# Patient Record
Sex: Female | Born: 1965 | Race: Black or African American | Hispanic: No | Marital: Married | Smoking: Never smoker
Health system: Southern US, Community
[De-identification: ages and names within clinical notes are randomized; demographics above are authoritative.]

## PROBLEM LIST (undated history)

## (undated) ENCOUNTER — Emergency Department (HOSPITAL_COMMUNITY): Admission: EM | Payer: Medicare Other

## (undated) DIAGNOSIS — G4733 Obstructive sleep apnea (adult) (pediatric): Secondary | ICD-10-CM

## (undated) DIAGNOSIS — I639 Cerebral infarction, unspecified: Secondary | ICD-10-CM

## (undated) DIAGNOSIS — N201 Calculus of ureter: Secondary | ICD-10-CM

## (undated) DIAGNOSIS — J45909 Unspecified asthma, uncomplicated: Secondary | ICD-10-CM

## (undated) DIAGNOSIS — F32A Depression, unspecified: Secondary | ICD-10-CM

## (undated) DIAGNOSIS — F209 Schizophrenia, unspecified: Secondary | ICD-10-CM

## (undated) DIAGNOSIS — K859 Acute pancreatitis without necrosis or infection, unspecified: Secondary | ICD-10-CM

## (undated) DIAGNOSIS — F329 Major depressive disorder, single episode, unspecified: Secondary | ICD-10-CM

## (undated) DIAGNOSIS — Z8709 Personal history of other diseases of the respiratory system: Secondary | ICD-10-CM

## (undated) DIAGNOSIS — F419 Anxiety disorder, unspecified: Secondary | ICD-10-CM

## (undated) DIAGNOSIS — R350 Frequency of micturition: Secondary | ICD-10-CM

## (undated) DIAGNOSIS — Z973 Presence of spectacles and contact lenses: Secondary | ICD-10-CM

## (undated) DIAGNOSIS — I10 Essential (primary) hypertension: Secondary | ICD-10-CM

## (undated) DIAGNOSIS — K219 Gastro-esophageal reflux disease without esophagitis: Secondary | ICD-10-CM

## (undated) DIAGNOSIS — F319 Bipolar disorder, unspecified: Secondary | ICD-10-CM

## (undated) DIAGNOSIS — K863 Pseudocyst of pancreas: Secondary | ICD-10-CM

## (undated) DIAGNOSIS — E119 Type 2 diabetes mellitus without complications: Secondary | ICD-10-CM

## (undated) HISTORY — PX: TONSILLECTOMY: SUR1361

---

## 2011-04-10 ENCOUNTER — Emergency Department (HOSPITAL_COMMUNITY)
Admission: EM | Admit: 2011-04-10 | Discharge: 2011-04-10 | Disposition: A | Discharge status: Home / Self Care | Payer: Medicaid Other | Attending: Emergency Medicine | Admitting: Emergency Medicine

## 2011-04-10 ENCOUNTER — Encounter: Payer: Self-pay | Admitting: *Deleted

## 2011-04-10 DIAGNOSIS — N390 Urinary tract infection, site not specified: Secondary | ICD-10-CM

## 2011-04-10 DIAGNOSIS — R300 Dysuria: Secondary | ICD-10-CM | POA: Insufficient documentation

## 2011-04-10 DIAGNOSIS — A499 Bacterial infection, unspecified: Secondary | ICD-10-CM | POA: Insufficient documentation

## 2011-04-10 DIAGNOSIS — R109 Unspecified abdominal pain: Secondary | ICD-10-CM | POA: Insufficient documentation

## 2011-04-10 DIAGNOSIS — N760 Acute vaginitis: Secondary | ICD-10-CM | POA: Insufficient documentation

## 2011-04-10 DIAGNOSIS — B9689 Other specified bacterial agents as the cause of diseases classified elsewhere: Secondary | ICD-10-CM | POA: Insufficient documentation

## 2011-04-10 LAB — URINE MICROSCOPIC-ADD ON

## 2011-04-10 LAB — URINALYSIS, ROUTINE W REFLEX MICROSCOPIC
Glucose, UA: NEGATIVE mg/dL
Hgb urine dipstick: NEGATIVE
Ketones, ur: NEGATIVE mg/dL
Protein, ur: NEGATIVE mg/dL
pH: 6 (ref 5.0–8.0)

## 2011-04-10 LAB — WET PREP, GENITAL: Yeast Wet Prep HPF POC: NONE SEEN

## 2011-04-10 MED ORDER — SULFAMETHOXAZOLE-TRIMETHOPRIM 800-160 MG PO TABS: 1.0000 | ORAL_TABLET | Freq: Two times a day (BID) | ORAL | Status: AC

## 2011-04-10 MED ORDER — METRONIDAZOLE 500 MG PO TABS: 500.0000 mg | ORAL_TABLET | Freq: Two times a day (BID) | ORAL | Status: AC

## 2011-04-10 NOTE — ED Notes (Signed)
Patient with burning sensation when she urinates.  She also states that the smell of her urine has an odor to it

## 2011-04-10 NOTE — ED Provider Notes (Signed)
History     CSN: [id number] Arrival date & time: 04/10/2011  7:49 PM   First MD Initiated Contact with Patient 04/10/11 2113      Chief Complaint  Patient presents with  . Urinary Tract Infection    (Consider location/radiation/quality/duration/timing/severity/associated sxs/prior treatment) HPI Comments: Patient reports burning pain with urination and abnormal odor that will not go away.  Burning is around genital area with urination. States her LMP was last week and she thinks she may have lost a tampon in her vagina. Denies fever, abdominal pain, frequency or urgency with urination, abnormal vaginal discharge, change in bowel habits.   Patient is a 45 y.o. female presenting with urinary tract infection. The history is provided by the patient.  Urinary Tract Infection    Past Medical History  Diagnosis Date  . Obesity     History reviewed. No pertinent past surgical history.  History reviewed. No pertinent family history.  History  Substance Use Topics  . Smoking status: Never Smoker   . Smokeless tobacco: Not on file  . Alcohol Use: No    OB History    Grav Para Term Preterm Abortions TAB SAB Ect Mult Living                  Review of Systems  All other systems reviewed and are negative.    Allergies  Review of patient's allergies indicates no known allergies.  Home Medications   Current Outpatient Rx  Name Route Sig Dispense Refill  . ACETAMINOPHEN 500 MG PO TABS Oral Take 1,000 mg by mouth daily as needed. For pain/headache       BP 126/77  Pulse 73  Temp(Src) 98.3 F (36.8 C) (Oral)  Resp 16  SpO2 98%  Physical Exam  Constitutional: She is oriented to person, place, and time. She appears well-developed and well-nourished.  HENT:  Head: Normocephalic and atraumatic.  Neck: Neck supple.  Cardiovascular: Normal rate, regular rhythm and normal heart sounds.   Pulmonary/Chest: Breath sounds normal. No respiratory distress. She has no wheezes.  She has no rales. She exhibits no tenderness.  Abdominal: Soft. Bowel sounds are normal. She exhibits no mass. There is tenderness in the suprapubic area. There is no rebound and no guarding.  Genitourinary:       Physical exam limited by patient's body habitus.  Normal external genitalia, small amount of thick white discharge within the vagina, no foreign body visualized, cervix is closed, nontender.  No adnexal tenderness or mass.    Neurological: She is alert and oriented to person, place, and time.    ED Course  Procedures (including critical care time)  Labs Reviewed  URINALYSIS, ROUTINE W REFLEX MICROSCOPIC - Abnormal; Notable for the following:    Bilirubin Urine SMALL (*)    Leukocytes, UA MODERATE (*)    All other components within normal limits  URINE MICROSCOPIC-ADD ON - Abnormal; Notable for the following:    Bacteria, UA FEW (*)    Crystals CA OXALATE CRYSTALS (*)    All other components within normal limits  POCT PREGNANCY, URINE  POCT PREGNANCY, URINE  WET PREP, GENITAL  GC/CHLAMYDIA PROBE AMP, GENITAL   No results found.   1. Urinary tract infection   2. Bacterial vaginosis       MDM  Patient with dysuria, abnormal smell and discomfort in her vaginal area.  +UTI, BV.  No fever, abdominal pain, N/V.  Pt is nontoxic.          Irving Burton  Leonard Schwartz [place], [place] 04/11/11 [phone]

## 2011-04-11 LAB — GC/CHLAMYDIA PROBE AMP, GENITAL: GC Probe Amp, Genital: NEGATIVE

## 2011-04-12 ENCOUNTER — Ambulatory Visit: Payer: Self-pay | Admitting: Physical Therapy

## 2011-04-12 NOTE — ED Provider Notes (Signed)
Medical screening examination/treatment/procedure(s) were performed by non-physician practitioner and as supervising physician I was immediately available for consultation/collaboration.  Donnetta Hutching, MD 04/12/11 [phone]

## 2012-08-01 ENCOUNTER — Other Ambulatory Visit (HOSPITAL_COMMUNITY): Payer: Self-pay | Admitting: Physician Assistant

## 2012-08-01 DIAGNOSIS — Z1231 Encounter for screening mammogram for malignant neoplasm of breast: Secondary | ICD-10-CM

## 2012-08-10 ENCOUNTER — Ambulatory Visit (HOSPITAL_COMMUNITY): Payer: Medicaid Other | Attending: Physician Assistant

## 2012-08-24 ENCOUNTER — Ambulatory Visit (HOSPITAL_COMMUNITY): Payer: Medicaid Other

## 2012-08-27 ENCOUNTER — Ambulatory Visit (HOSPITAL_COMMUNITY): Payer: Medicaid Other | Attending: Physician Assistant

## 2012-10-30 ENCOUNTER — Emergency Department (HOSPITAL_BASED_OUTPATIENT_CLINIC_OR_DEPARTMENT_OTHER)
Admission: EM | Admit: 2012-10-30 | Discharge: 2012-10-30 | Disposition: A | Discharge status: Home / Self Care | Payer: Medicaid Other | Attending: Emergency Medicine | Admitting: Emergency Medicine

## 2012-10-30 ENCOUNTER — Encounter (HOSPITAL_BASED_OUTPATIENT_CLINIC_OR_DEPARTMENT_OTHER): Payer: Self-pay | Admitting: *Deleted

## 2012-10-30 ENCOUNTER — Emergency Department (HOSPITAL_BASED_OUTPATIENT_CLINIC_OR_DEPARTMENT_OTHER): Payer: Medicaid Other

## 2012-10-30 ENCOUNTER — Other Ambulatory Visit: Payer: Self-pay

## 2012-10-30 DIAGNOSIS — R05 Cough: Secondary | ICD-10-CM | POA: Insufficient documentation

## 2012-10-30 DIAGNOSIS — Z79899 Other long term (current) drug therapy: Secondary | ICD-10-CM | POA: Insufficient documentation

## 2012-10-30 DIAGNOSIS — R059 Cough, unspecified: Secondary | ICD-10-CM | POA: Insufficient documentation

## 2012-10-30 DIAGNOSIS — J45901 Unspecified asthma with (acute) exacerbation: Secondary | ICD-10-CM | POA: Insufficient documentation

## 2012-10-30 DIAGNOSIS — E669 Obesity, unspecified: Secondary | ICD-10-CM | POA: Insufficient documentation

## 2012-10-30 DIAGNOSIS — I10 Essential (primary) hypertension: Secondary | ICD-10-CM | POA: Insufficient documentation

## 2012-10-30 DIAGNOSIS — J4541 Moderate persistent asthma with (acute) exacerbation: Secondary | ICD-10-CM

## 2012-10-30 HISTORY — DX: Essential (primary) hypertension: I10

## 2012-10-30 HISTORY — DX: Unspecified asthma, uncomplicated: J45.909

## 2012-10-30 IMAGING — CR DG CHEST 2V
2 series · 2 of 2 positions shown · non-contrast
Comparison: None.

CLINICAL DATA: Shortness of breath.  Chest pain and cough.

CHEST - 2 VIEW

[w chest pa]
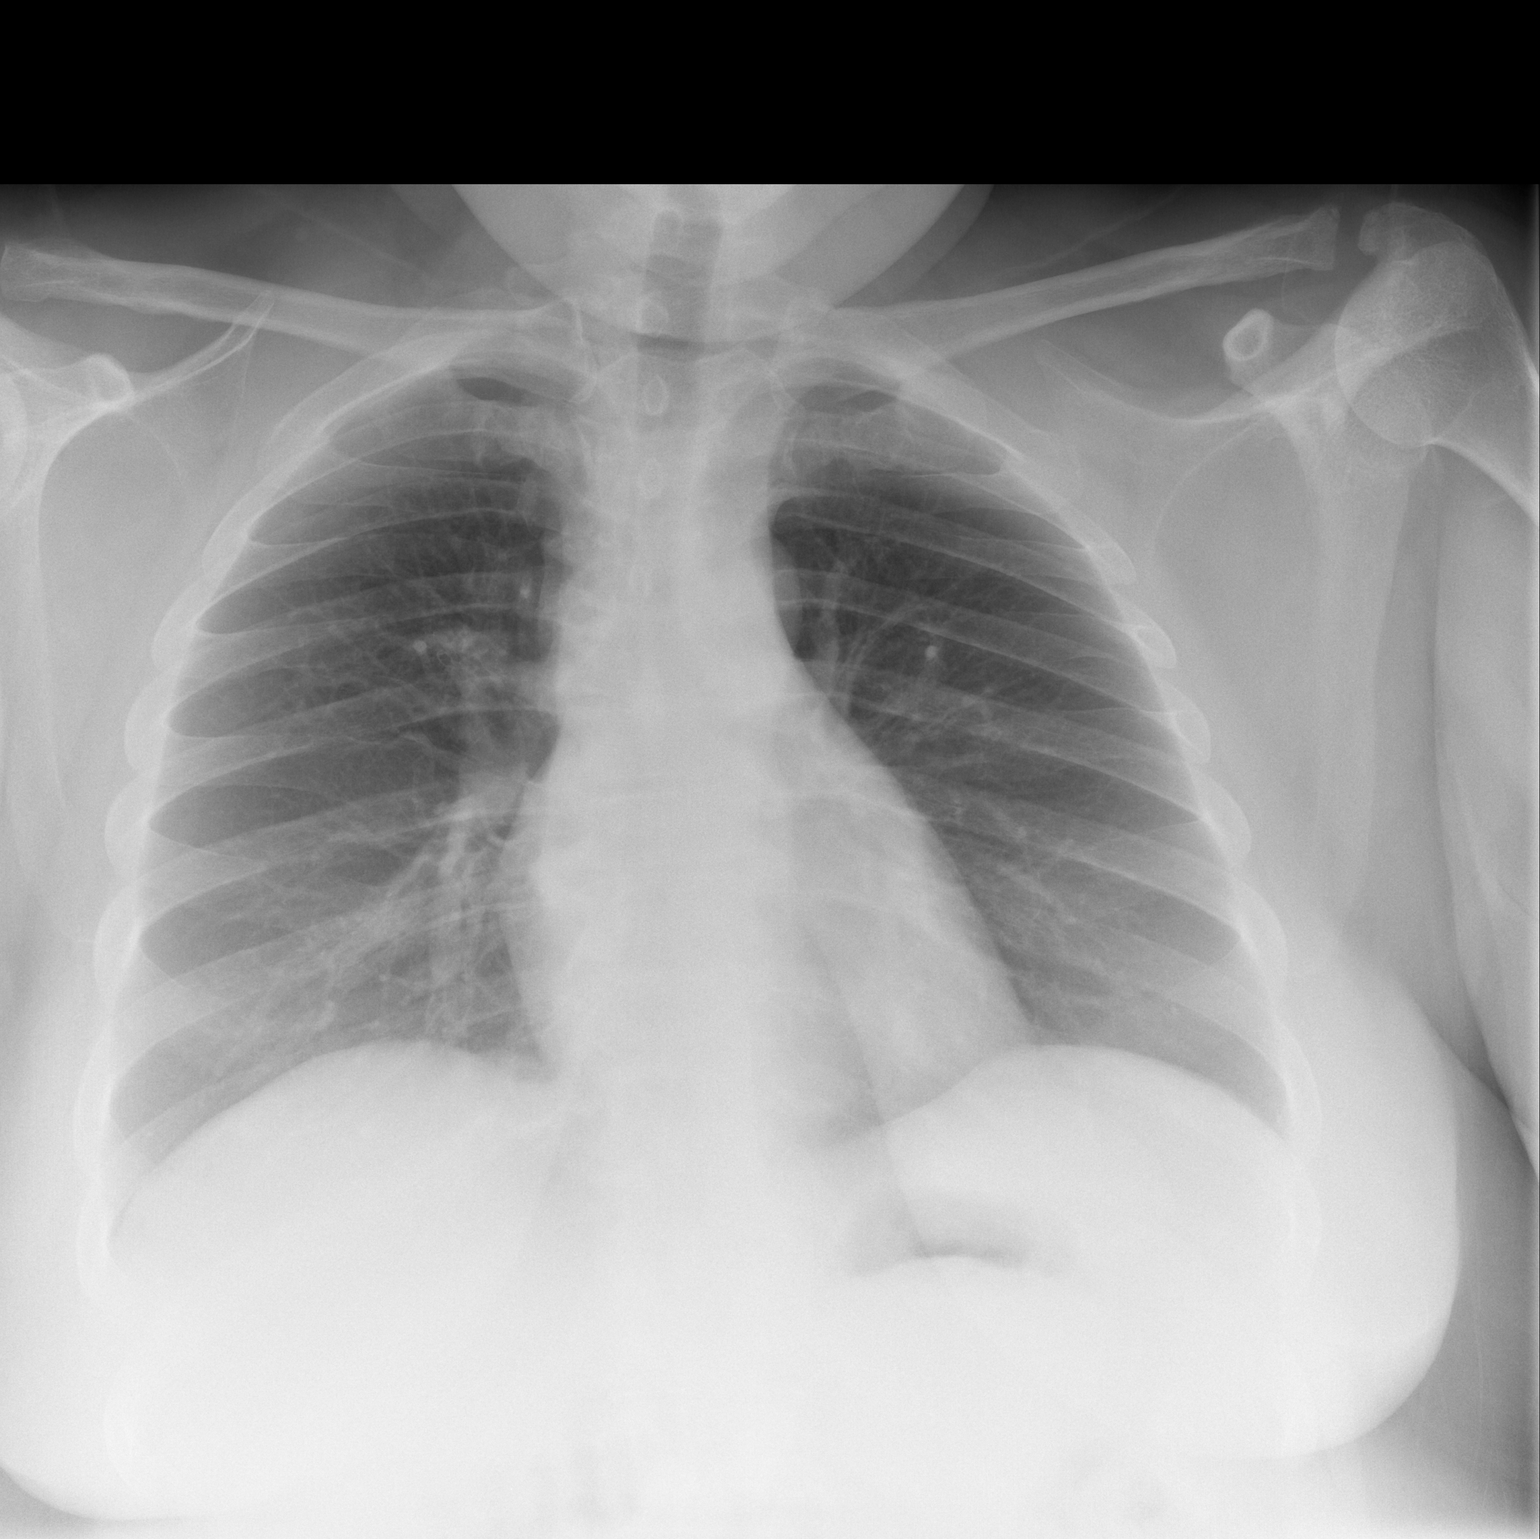

[w chest lat]
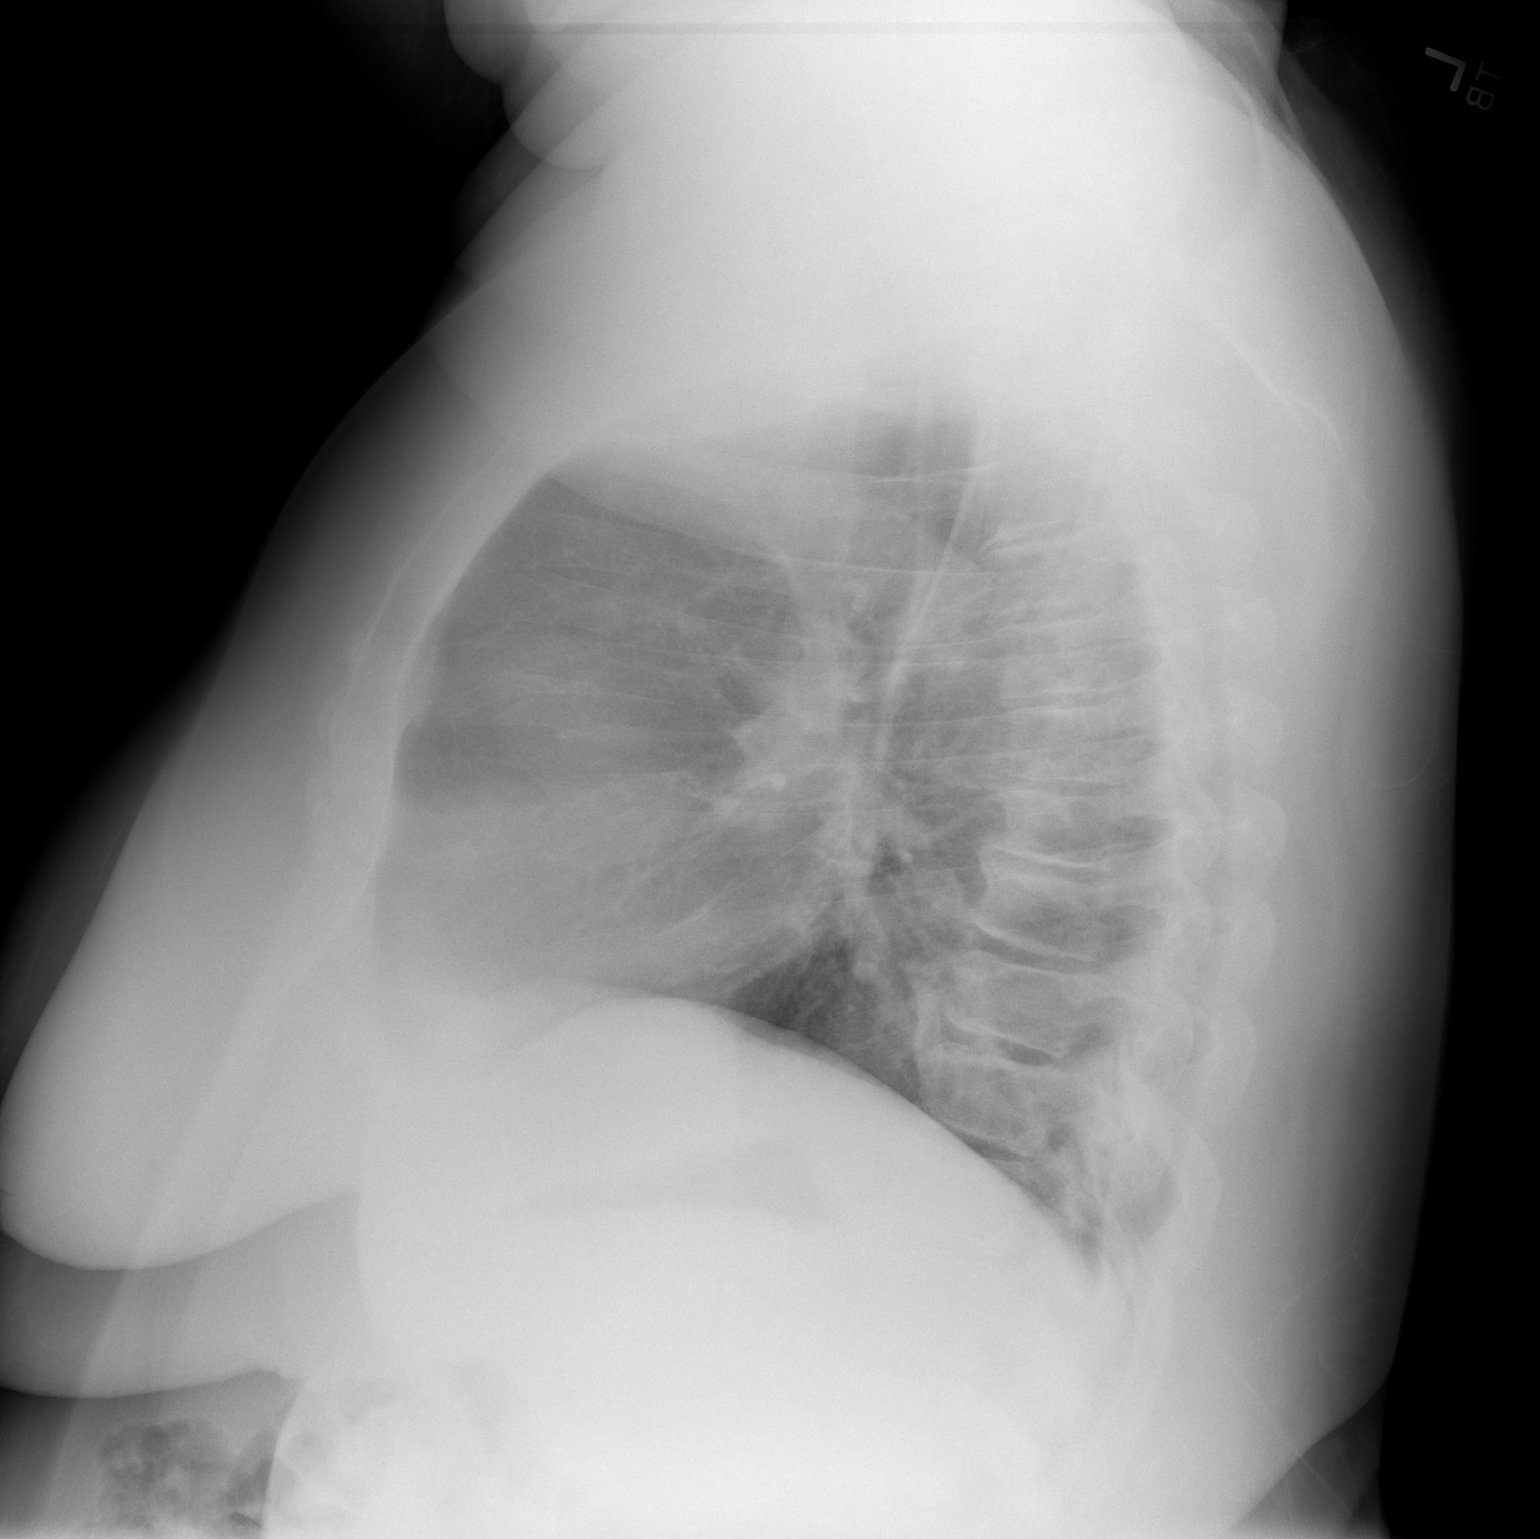

[2 of 2 positions shown; findings below may reference images not displayed]

FINDINGS: The heart size and pulmonary vascularity are normal. The
lungs appear clear and expanded without focal air space disease or
consolidation. No blunting of the costophrenic angles.  No
pneumothorax.  Mediastinal contours appear intact.
IMPRESSION: No evidence of active pulmonary disease.

## 2012-10-30 MED ORDER — ALBUTEROL SULFATE (5 MG/ML) 0.5% IN NEBU
5.0000 mg | INHALATION_SOLUTION | Freq: Once | RESPIRATORY_TRACT | Status: AC
  Administered 2012-10-30: 5 mg via RESPIRATORY_TRACT

## 2012-10-30 MED ORDER — HYDROCOD POLST-CHLORPHEN POLST 10-8 MG/5ML PO LQCR
5.0000 mL | Freq: Once | ORAL | Status: AC
  Administered 2012-10-30: 5 mL via ORAL
  Filled 2012-10-30: qty 5

## 2012-10-30 MED ORDER — PREDNISONE 20 MG PO TABS
40.0000 mg | ORAL_TABLET | Freq: Once | ORAL | Status: AC
  Administered 2012-10-30: 40 mg via ORAL
  Filled 2012-10-30: qty 2

## 2012-10-30 MED ORDER — ALBUTEROL SULFATE (5 MG/ML) 0.5% IN NEBU
INHALATION_SOLUTION | RESPIRATORY_TRACT | Status: AC
  Filled 2012-10-30: qty 1

## 2012-10-30 MED ORDER — ALBUTEROL SULFATE HFA 108 (90 BASE) MCG/ACT IN AERS
2.0000 | INHALATION_SPRAY | RESPIRATORY_TRACT | Status: DC
  Administered 2012-10-30: 2 via RESPIRATORY_TRACT
  Filled 2012-10-30: qty 6.7

## 2012-10-30 MED ORDER — IPRATROPIUM BROMIDE 0.02 % IN SOLN
0.5000 mg | Freq: Once | RESPIRATORY_TRACT | Status: AC
  Administered 2012-10-30: 0.5 mg via RESPIRATORY_TRACT
  Filled 2012-10-30: qty 2.5

## 2012-10-30 MED ORDER — HYDROCOD POLST-CHLORPHEN POLST 10-8 MG/5ML PO LQCR: 5.0000 mL | Freq: Two times a day (BID) | ORAL | Status: DC | PRN

## 2012-10-30 MED ORDER — ALBUTEROL SULFATE (5 MG/ML) 0.5% IN NEBU
5.0000 mg | INHALATION_SOLUTION | Freq: Once | RESPIRATORY_TRACT | Status: AC
  Administered 2012-10-30: 5 mg via RESPIRATORY_TRACT
  Filled 2012-10-30: qty 1

## 2012-10-30 MED ORDER — PREDNISONE 10 MG PO TABS: 20.0000 mg | ORAL_TABLET | Freq: Two times a day (BID) | ORAL | Status: DC

## 2012-10-30 NOTE — ED Notes (Signed)
MD at bedside. 

## 2012-10-30 NOTE — ED Notes (Signed)
Pt c/o SOB x 30 mins with cough and chest pressure. Denies fever.

## 2012-10-30 NOTE — ED Provider Notes (Signed)
History     CSN: [id number]  Arrival date & time 10/30/12  0155   First MD Initiated Contact with Patient 10/30/12 [phone]      Chief Complaint  Patient presents with  . Shortness of Breath    (Consider location/radiation/quality/duration/timing/severity/associated sxs/prior treatment) HPI Comments: Patient with history of asthma.  Presents complaining of coughing earlier this evening, then developed difficulty breathing about 30 minutes prior to coming here.  She has run out of her inhaler.  She is a non-smoker.  Patient is a 47 y.o. female presenting with shortness of breath. The history is provided by the patient.  Shortness of Breath Severity:  Moderate Onset quality:  Sudden Duration:  30 minutes Timing:  Constant Progression:  Worsening Chronicity:  New Context: not activity and not URI   Relieved by:  Nothing Worsened by:  Nothing tried Ineffective treatments:  None tried   Past Medical History  Diagnosis Date  . Obesity   . Asthma   . Hypertension     History reviewed. No pertinent past surgical history.  History reviewed. No pertinent family history.  History  Substance Use Topics  . Smoking status: Never Smoker   . Smokeless tobacco: Not on file  . Alcohol Use: No    OB History   Grav Para Term Preterm Abortions TAB SAB Ect Mult Living                  Review of Systems  Respiratory: Positive for shortness of breath.   All other systems reviewed and are negative.    Allergies  Review of patient's allergies indicates no known allergies.  Home Medications   Current Outpatient Rx  Name  Route  Sig  Dispense  Refill  . albuterol-ipratropium (COMBIVENT) 18-103 MCG/ACT inhaler   Inhalation   Inhale 2 puffs into the lungs every 6 (six) hours as needed for wheezing.         Marland Kitchen ALPRAZolam (XANAX) 1 MG tablet   Oral   Take 1 mg by mouth at bedtime as needed for sleep.         Marland Kitchen amitriptyline (ELAVIL) 50 MG tablet   Oral   Take 50 mg by  mouth at bedtime.         Marland Kitchen amLODipine (NORVASC) 10 MG tablet   Oral   Take 10 mg by mouth daily.         . clonazePAM (KLONOPIN) 1 MG tablet   Oral   Take 1 mg by mouth 2 (two) times daily as needed for anxiety.         Marland Kitchen FLUoxetine (PROZAC) 20 MG capsule   Oral   Take 20 mg by mouth daily.         Marland Kitchen lisinopril-hydrochlorothiazide (PRINZIDE,ZESTORETIC) 20-25 MG per tablet   Oral   Take 1 tablet by mouth daily.         . methylphenidate (RITALIN) 20 MG tablet   Oral   Take 20 mg by mouth 2 (two) times daily.         . pantoprazole (PROTONIX) 20 MG tablet   Oral   Take 20 mg by mouth daily.         . sertraline (ZOLOFT) 100 MG tablet   Oral   Take 100 mg by mouth daily.         Marland Kitchen acetaminophen (TYLENOL) 500 MG tablet   Oral   Take 1,000 mg by mouth daily as needed. For pain/headache  BP 161/105  Pulse 89  Temp(Src) 98.7 F (37.1 C) (Oral)  Resp 18  Ht 5\' 10"  (1.778 m)  Wt 330 lb (149.687 kg)  BMI 47.35 kg/m2  SpO2 100%  Physical Exam  Nursing note and vitals reviewed. Constitutional: She is oriented to person, place, and time. She appears well-developed and well-nourished. No distress.  HENT:  Head: Normocephalic and atraumatic.  Neck: Normal range of motion. Neck supple.  Cardiovascular: Normal rate and regular rhythm.  Exam reveals no gallop and no friction rub.   No murmur heard. Pulmonary/Chest: Effort normal. No respiratory distress. She has wheezes.  There are expiratory wheezes present bilaterally.  Abdominal: Soft. Bowel sounds are normal. She exhibits no distension. There is no tenderness.  Musculoskeletal: Normal range of motion.  Neurological: She is alert and oriented to person, place, and time.  Skin: Skin is warm and dry. She is not diaphoretic.    ED Course  Procedures (including critical care time)  Labs Reviewed - No data to display No results found.   No diagnosis found.   Date: 10/30/2012  Rate: 85   Rhythm: normal sinus rhythm  QRS Axis: normal  Intervals: normal  ST/T Wave abnormalities: normal  Conduction Disutrbances:none  Narrative Interpretation:   Old EKG Reviewed: none available    MDM  The chest xray is clear and the lungs have cleared with treatment given.  Will discharge with prednisone, mdi, tussionex.  I see no indication for antibiotics at this time.        Geoffery Lyons, MD 10/30/12 [phone]

## 2012-10-31 NOTE — Progress Notes (Signed)
[facility] ED CM received a voice message from pt left at  [phone] 10/31/12 with request to return a call to her at [phone] and the cost of Rx is $95 CM reviewed the EPIC notes to find that pt was seen at [facility] by Dr Alain Marion on 10/30/12 and rx chlorpheniramine-Hydrocodone on d/c Pt is covered by medicaid as she confirmed in her voice message. Cm called [phone] [facility] to attempt to speak with Dr Judd Lien but he is not present.  CM spoke with pt at 1218 to discuss that [facility] does not have a program that may assist her at this time with the cost of her Rx.  CM recommended 1) she speak with her pcp to get a new Rx for a lower cost medication Explained to her the Cm unable to find Dr Judd Lien to discuss a possible recommendation for change in medication Pt voiced understanding and appreciation of Cm's attempt. Pt states her son works part time at [employer] and she will have him to inquire of the pharmacist about a lower cost medication Reports she inquired of her pharmacist but was told to call her pcp. CM recommend possible tussinex with codeine  2) contacting the drug company making the medication, [employer] (no toll free number listed) or going to www.needymeds.com 3612106064) or www.[email or web address] (there is a coupon on the site for $25)  Cm requested pt return a call if needed

## 2012-11-01 ENCOUNTER — Telehealth (HOSPITAL_COMMUNITY): Payer: Self-pay | Admitting: Emergency Medicine

## 2012-11-01 NOTE — ED Notes (Signed)
Per note by Case Manager, went to website specified in note and printed off coupon for patient. Took coupon to front desk for patient to pick up.

## 2013-04-24 ENCOUNTER — Emergency Department (HOSPITAL_COMMUNITY)
Admission: EM | Admit: 2013-04-24 | Discharge: 2013-04-24 | Disposition: A | Discharge status: Home / Self Care | Payer: Medicaid Other | Attending: Emergency Medicine | Admitting: Emergency Medicine

## 2013-04-24 ENCOUNTER — Emergency Department (HOSPITAL_COMMUNITY): Payer: Medicaid Other

## 2013-04-24 ENCOUNTER — Encounter (HOSPITAL_COMMUNITY): Payer: Self-pay | Admitting: Emergency Medicine

## 2013-04-24 DIAGNOSIS — R112 Nausea with vomiting, unspecified: Secondary | ICD-10-CM | POA: Insufficient documentation

## 2013-04-24 DIAGNOSIS — N132 Hydronephrosis with renal and ureteral calculous obstruction: Secondary | ICD-10-CM

## 2013-04-24 DIAGNOSIS — J45909 Unspecified asthma, uncomplicated: Secondary | ICD-10-CM | POA: Insufficient documentation

## 2013-04-24 DIAGNOSIS — IMO0002 Reserved for concepts with insufficient information to code with codable children: Secondary | ICD-10-CM | POA: Insufficient documentation

## 2013-04-24 DIAGNOSIS — R51 Headache: Secondary | ICD-10-CM | POA: Insufficient documentation

## 2013-04-24 DIAGNOSIS — N200 Calculus of kidney: Secondary | ICD-10-CM

## 2013-04-24 DIAGNOSIS — F209 Schizophrenia, unspecified: Secondary | ICD-10-CM | POA: Insufficient documentation

## 2013-04-24 DIAGNOSIS — Z79899 Other long term (current) drug therapy: Secondary | ICD-10-CM | POA: Insufficient documentation

## 2013-04-24 DIAGNOSIS — N201 Calculus of ureter: Secondary | ICD-10-CM | POA: Insufficient documentation

## 2013-04-24 DIAGNOSIS — E669 Obesity, unspecified: Secondary | ICD-10-CM | POA: Insufficient documentation

## 2013-04-24 DIAGNOSIS — N1330 Unspecified hydronephrosis: Secondary | ICD-10-CM | POA: Insufficient documentation

## 2013-04-24 DIAGNOSIS — I10 Essential (primary) hypertension: Secondary | ICD-10-CM | POA: Insufficient documentation

## 2013-04-24 HISTORY — DX: Schizophrenia, unspecified: F20.9

## 2013-04-24 LAB — CBC WITH DIFFERENTIAL/PLATELET
Eosinophils Absolute: 0.2 10*3/uL (ref 0.0–0.7)
HCT: 31.2 % — ABNORMAL LOW (ref 36.0–46.0)
Hemoglobin: 9.5 g/dL — ABNORMAL LOW (ref 12.0–15.0)
Lymphocytes Relative: 30 % (ref 12–46)
Lymphs Abs: 1.7 10*3/uL (ref 0.7–4.0)
Monocytes Absolute: 0.6 10*3/uL (ref 0.1–1.0)
Monocytes Relative: 10 % (ref 3–12)
Neutro Abs: 3.2 10*3/uL (ref 1.7–7.7)
Platelets: 269 10*3/uL (ref 150–400)
RBC: 4.06 MIL/uL (ref 3.87–5.11)
WBC: 5.8 10*3/uL (ref 4.0–10.5)

## 2013-04-24 LAB — URINALYSIS, ROUTINE W REFLEX MICROSCOPIC
Bilirubin Urine: NEGATIVE
Glucose, UA: NEGATIVE mg/dL
Ketones, ur: NEGATIVE mg/dL
Leukocytes, UA: NEGATIVE
Protein, ur: NEGATIVE mg/dL

## 2013-04-24 LAB — LIPASE, BLOOD: Lipase: 23 U/L (ref 11–59)

## 2013-04-24 LAB — COMPREHENSIVE METABOLIC PANEL
BUN: 13 mg/dL (ref 6–23)
CO2: 22 mEq/L (ref 19–32)
Chloride: 105 mEq/L (ref 96–112)
Creatinine, Ser: 0.94 mg/dL (ref 0.50–1.10)
GFR calc non Af Amer: 72 mL/min — ABNORMAL LOW (ref >90)
Sodium: 138 mEq/L (ref 135–145)
Total Bilirubin: 0.1 mg/dL — ABNORMAL LOW (ref 0.3–1.2)

## 2013-04-24 LAB — URINE MICROSCOPIC-ADD ON

## 2013-04-24 IMAGING — CT CT ABD-PELV W/O CM
2 of 4 series · 16 of 46 positions shown, 18 images · non-contrast
Comparison: Abdominal ultrasound [DATE]

CLINICAL DATA: Flank pain

EXAM:
CT ABDOMEN AND PELVIS WITHOUT CONTRAST
TECHNIQUE: Multidetector CT imaging of the abdomen and pelvis was performed
following the standard protocol without oral or intravenous
contrast.

[Series 3: routine · axial · 0.74mm/px · z∈[+48,+483]mm · 13 of 97 slices shown, 15 images]
[im 5/97  soft-tissue]
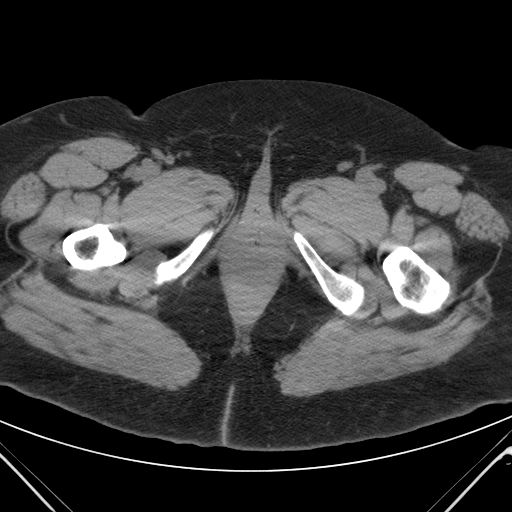
[im 5/97  bone]
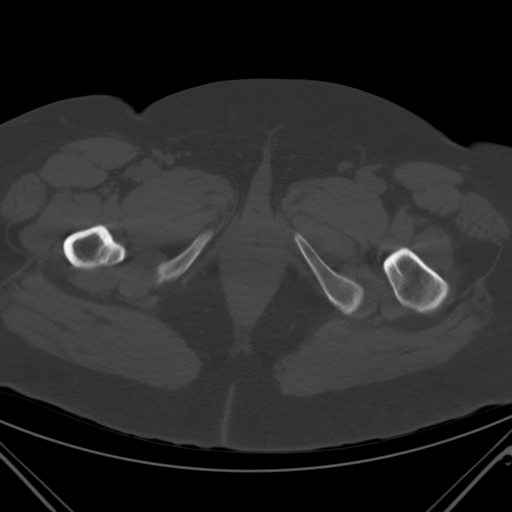
[im 13/97  soft-tissue]
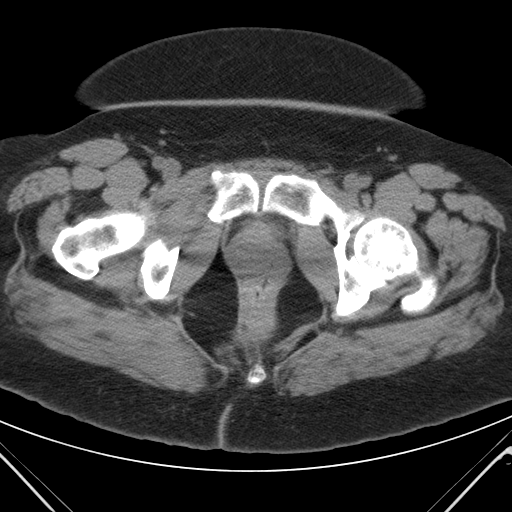
[im 21/97  soft-tissue]
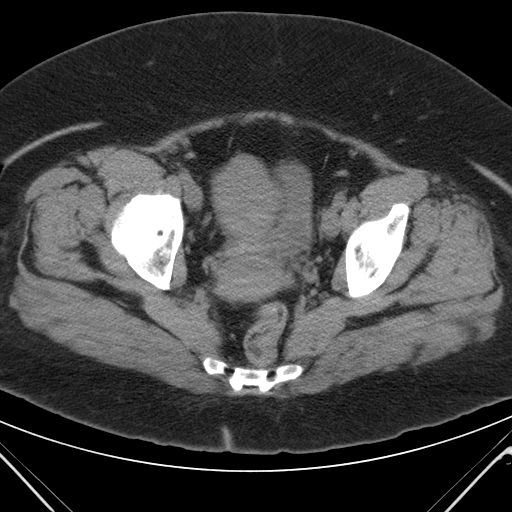
[im 26/97  soft-tissue]
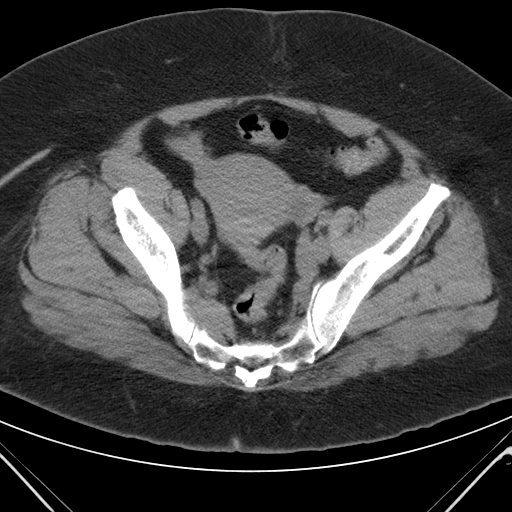
[im 34/97  soft-tissue]
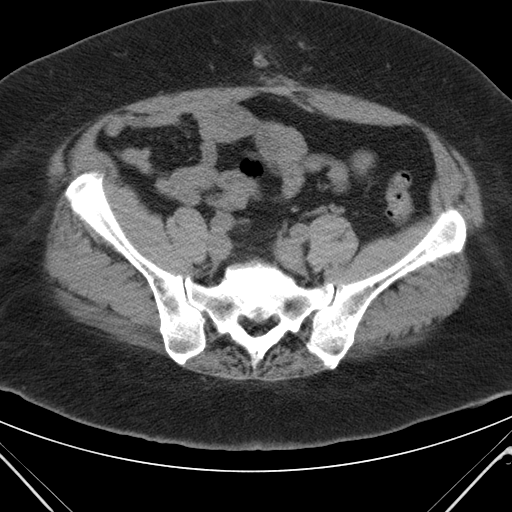
[im 42/97  soft-tissue]
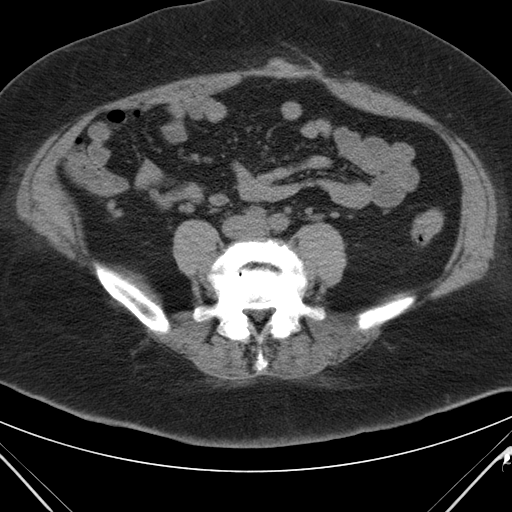
[im 51/97  soft-tissue]
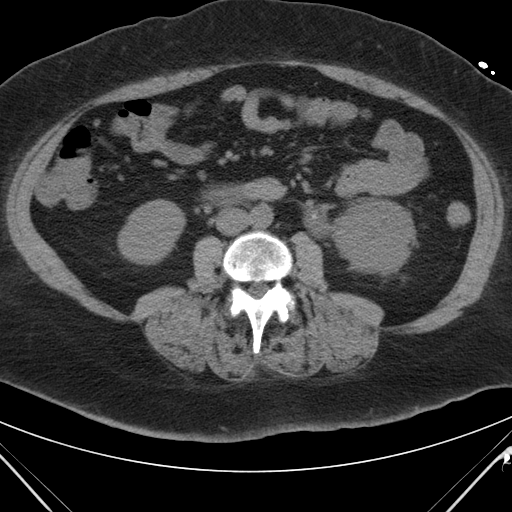
[im 55/97  soft-tissue]
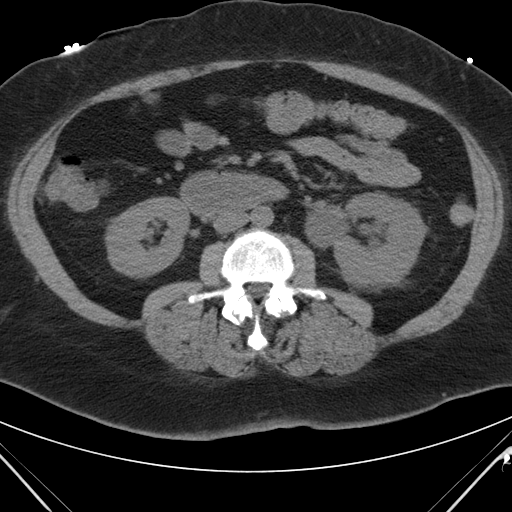
[im 63/97  soft-tissue]
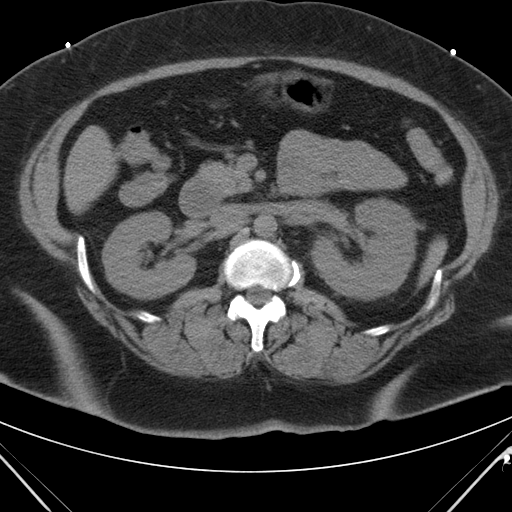
[im 63/97  bone]
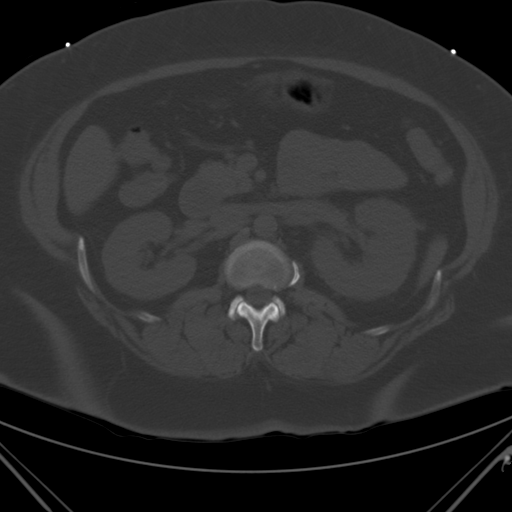
[im 71/97  soft-tissue]
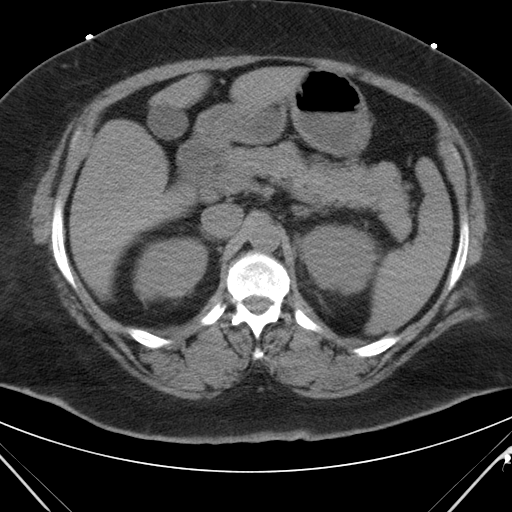
[im 76/97  soft-tissue]
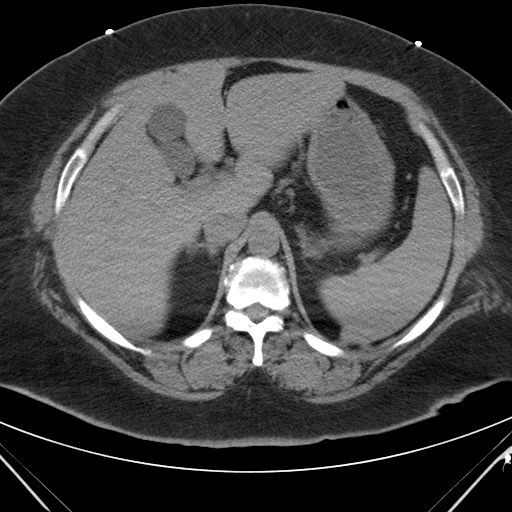
[im 84/97  soft-tissue]
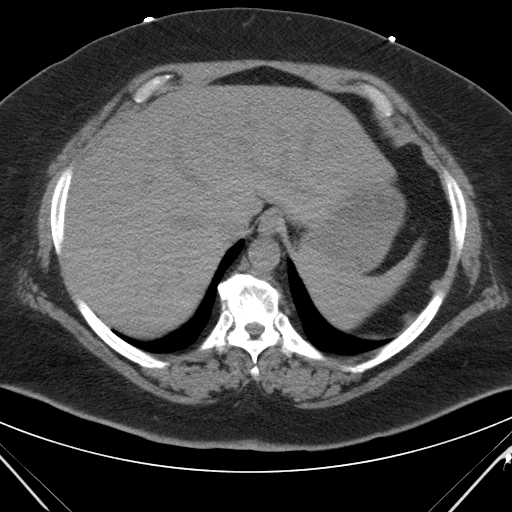
[im 92/97  soft-tissue]
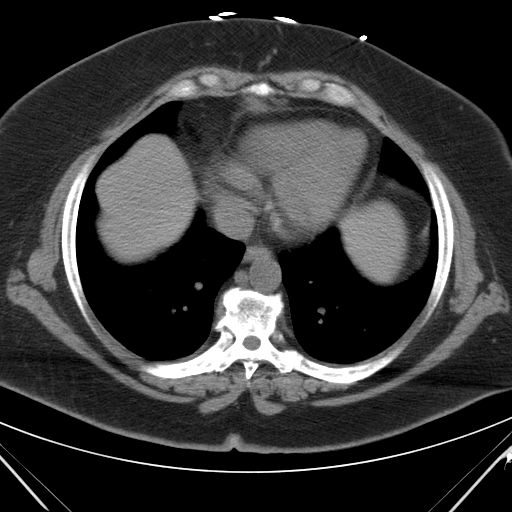

[cor · coronal · 0.94mm/px · 3 of 116 slices shown]
[im 39/116  soft-tissue]
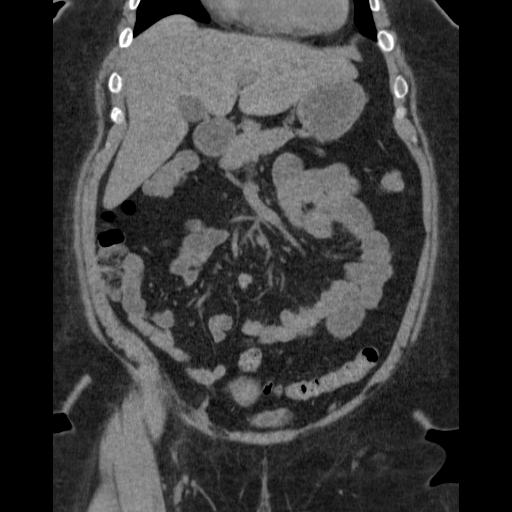
[im 52/116  soft-tissue]
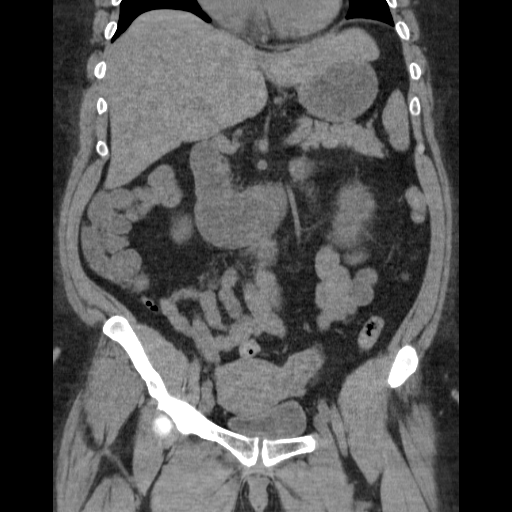
[im 64/116  soft-tissue]
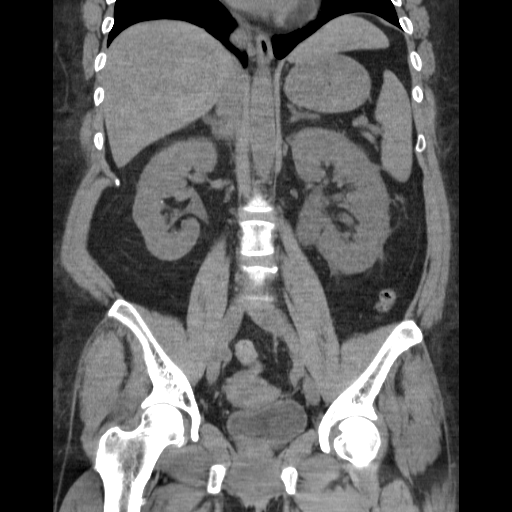

[16 of 46 positions shown; findings below may reference images not displayed]

FINDINGS: Lung bases are clear.

There is a degree of fatty change in the liver. No focal liver
lesions are identified on this noncontrast enhanced study. There is
no biliary duct dilatation. Gallbladder wall does not appear
thickened.

Spleen, pancreas, and right adrenal appear normal. There is slight
left adrenal hypertrophy diffusely.

There is a 1 mm calculus in the lateral upper pole right kidney.
There is a 4 x 3 mm calculus more medially in the right upper pole
kidney region. There are two, 1 mm calculi in the mid right kidney.
There is no appreciable right renal mass or hydronephrosis. There is
a 4 mm calculus at the right ureterovesical junction which is not
causing appreciable hydronephrosis currently.

The left kidney appears edematous with moderate hydronephrosis.
There is a 1 mm calculus in the mid left ureter. There is no renal
mass on the left. There is a focal calculus at the left
ureteropelvic junction measuring 6 x 6 mm. No other ureteral
calculus is seen on the left.

In the pelvis, there is no mass or fluid collection. Incidental note
is made of a 1.5 x 1.5 cm dominant follicle in the right ovary. The
appendix appears normal.

There is no bowel obstruction.  No free air or portal venous air.

There is no ascites, adenopathy, or abscess in the abdomen or
pelvis. There is mild periumbilical scarring. Aorta is non
aneurysmal. There is degenerative change in the lower thoracic and
lumbar spine. There are no blastic or lytic bone lesions. There is 1
cm of anterolisthesis of L4 on L5 with apparent pars defects at L4
bilaterally.
IMPRESSION: 6 mm calculus at the left ureteropelvic junction causing moderate
hydronephrosis on the left and left renal edema.

4 mm calculus right ureterovesical junction, not causing appreciable
hydronephrosis.

Small nonobstructing calculi in each kidney.

Fatty liver.

Mild left adrenal hypertrophy diffusely.

Spondylolisthesis at L4-5 due to pars defects. Extensive
osteoarthritic change in this region.

## 2013-04-24 MED ORDER — GI COCKTAIL ~~LOC~~
30.0000 mL | Freq: Once | ORAL | Status: AC
  Administered 2013-04-24: 30 mL via ORAL
  Filled 2013-04-24: qty 30

## 2013-04-24 MED ORDER — PANTOPRAZOLE SODIUM 40 MG PO TBEC
40.0000 mg | DELAYED_RELEASE_TABLET | Freq: Once | ORAL | Status: AC
  Administered 2013-04-24: 40 mg via ORAL
  Filled 2013-04-24: qty 1

## 2013-04-24 MED ORDER — PANTOPRAZOLE SODIUM 40 MG PO TBEC
40.0000 mg | DELAYED_RELEASE_TABLET | Freq: Once | ORAL | Status: DC
  Filled 2013-04-24: qty 1

## 2013-04-24 MED ORDER — TAMSULOSIN HCL 0.4 MG PO CAPS: 0.4000 mg | ORAL_CAPSULE | Freq: Every day | ORAL | Status: DC

## 2013-04-24 MED ORDER — ONDANSETRON 4 MG PO TBDP
4.0000 mg | ORAL_TABLET | Freq: Once | ORAL | Status: AC
  Administered 2013-04-24: 4 mg via ORAL
  Filled 2013-04-24: qty 1

## 2013-04-24 MED ORDER — SUCRALFATE 1 G PO TABS
1.0000 g | ORAL_TABLET | Freq: Once | ORAL | Status: AC
  Administered 2013-04-24: 1 g via ORAL
  Filled 2013-04-24 (×2): qty 1

## 2013-04-24 MED ORDER — PROMETHAZINE HCL 25 MG PO TABS: 25.0000 mg | ORAL_TABLET | Freq: Four times a day (QID) | ORAL | Status: DC | PRN

## 2013-04-24 MED ORDER — KETOROLAC TROMETHAMINE 60 MG/2ML IM SOLN
60.0000 mg | Freq: Once | INTRAMUSCULAR | Status: AC
  Administered 2013-04-24: 60 mg via INTRAMUSCULAR
  Filled 2013-04-24: qty 2

## 2013-04-24 MED ORDER — HYDROCODONE-ACETAMINOPHEN 5-325 MG PO TABS: ORAL_TABLET | ORAL | Status: DC

## 2013-04-24 NOTE — ED Notes (Signed)
Pt with no signs of adverse reaction to toradol. Ambulatory to exit. Rating pain 3/10.

## 2013-04-24 NOTE — ED Provider Notes (Signed)
7:53 AM Pt is comfortable, but still in pain, CT shows 2 ureteral stones, 1 mm and 6 mm.  Pt instructed to follow up with Urology in teh next couple of days for follow up, procedure may be indicated.  No fever, no elevated WBC, no signs of UTI.  Rx for pain, nausea, and flomax given.  Strainer given.    Gavin Pound. Oletta Lamas, MD 04/24/13 [phone]

## 2013-04-24 NOTE — ED Notes (Signed)
Patient said she started having side pain about 2400hrs last night and it progressively got worse so she decided to come in.  The patient said her pain is on the right side of her abdomen rates it 9/10.  She has been nauseated and threw up some bile twice.  Denies diarrhea.

## 2013-04-24 NOTE — ED Provider Notes (Signed)
CSN: [id number]     Arrival date & time 04/24/13  0430 History   First MD Initiated Contact with Patient 04/24/13 [phone]     Chief Complaint  Patient presents with  . Abdominal Pain   (Consider location/radiation/quality/duration/timing/severity/associated sxs/prior Treatment) HPI Pt presents with LUQ pain starting a midnight and waking her from her sleep. Pain does not radiate. She states she had several episodes of nonbilious nonbloody vomit. She denies any melena. She does state she takes ibuprofen daily for headaches. She has no shortness of breath or chest pain. She denies any urinary or vaginal symptoms. Past Medical History  Diagnosis Date  . Obesity   . Asthma   . Hypertension   . Schizophrenia    History reviewed. No pertinent past surgical history. History reviewed. No pertinent family history. History  Substance Use Topics  . Smoking status: Never Smoker   . Smokeless tobacco: Not on file  . Alcohol Use: No   OB History   Grav Para Term Preterm Abortions TAB SAB Ect Mult Living                 Review of Systems  Constitutional: Negative for fever and chills.  Respiratory: Negative for shortness of breath.   Cardiovascular: Negative for chest pain.  Gastrointestinal: Positive for nausea, vomiting and abdominal pain. Negative for diarrhea and constipation.  Genitourinary: Negative for dysuria, frequency, flank pain, vaginal bleeding, vaginal discharge and pelvic pain.  Musculoskeletal: Negative for back pain, myalgias, neck pain and neck stiffness.  Skin: Negative for rash and wound.  Neurological: Negative for dizziness, weakness, light-headedness, numbness and headaches.  All other systems reviewed and are negative.    Allergies  Review of patient's allergies indicates no known allergies.  Home Medications   Current Outpatient Rx  Name  Route  Sig  Dispense  Refill  . acetaminophen (TYLENOL) 500 MG tablet   Oral   Take 1,000 mg by mouth daily as needed.  For pain/headache          . albuterol-ipratropium (COMBIVENT) 18-103 MCG/ACT inhaler   Inhalation   Inhale 2 puffs into the lungs every 6 (six) hours as needed for wheezing.         Marland Kitchen ALPRAZolam (XANAX) 1 MG tablet   Oral   Take 1 mg by mouth at bedtime as needed for sleep.         Marland Kitchen amitriptyline (ELAVIL) 50 MG tablet   Oral   Take 50 mg by mouth at bedtime.         Marland Kitchen amLODipine (NORVASC) 10 MG tablet   Oral   Take 10 mg by mouth daily.         . chlorpheniramine-HYDROcodone (TUSSIONEX PENNKINETIC ER) 10-8 MG/5ML LQCR   Oral   Take 5 mLs by mouth every 12 (twelve) hours as needed.   140 mL   0   . clonazePAM (KLONOPIN) 1 MG tablet   Oral   Take 1 mg by mouth 2 (two) times daily as needed for anxiety.         Marland Kitchen FLUoxetine (PROZAC) 20 MG capsule   Oral   Take 20 mg by mouth daily.         Marland Kitchen lisinopril-hydrochlorothiazide (PRINZIDE,ZESTORETIC) 20-25 MG per tablet   Oral   Take 1 tablet by mouth daily.         . methylphenidate (RITALIN) 20 MG tablet   Oral   Take 20 mg by mouth 2 (two) times daily.         Marland Kitchen  pantoprazole (PROTONIX) 20 MG tablet   Oral   Take 20 mg by mouth daily.         . predniSONE (DELTASONE) 10 MG tablet   Oral   Take 2 tablets (20 mg total) by mouth 2 (two) times daily.   20 tablet   0   . sertraline (ZOLOFT) 100 MG tablet   Oral   Take 100 mg by mouth daily.          BP 127/81  Pulse 71  Temp(Src) 97.8 F (36.6 C) (Oral)  Resp 16  SpO2 100%  LMP 04/11/2013 Physical Exam  Nursing note and vitals reviewed. Constitutional: She is oriented to person, place, and time. She appears well-developed and well-nourished. No distress.  HENT:  Head: Normocephalic and atraumatic.  Mouth/Throat: Oropharynx is clear and moist.  Eyes: EOM are normal. Pupils are equal, round, and reactive to light.  Neck: Normal range of motion. Neck supple.  Cardiovascular: Normal rate and regular rhythm.   Pulmonary/Chest: Effort normal  and breath sounds normal. No respiratory distress. She has no wheezes. She has no rales.  Abdominal: Soft. Bowel sounds are normal. She exhibits no distension and no mass. There is tenderness (tenderness to palpation in the left upper quadrant. No rebound or guarding). There is no rebound and no guarding.  Musculoskeletal: Normal range of motion. She exhibits no edema and no tenderness.  No CVA tenderness.  Neurological: She is alert and oriented to person, place, and time.  Skin: Skin is warm and dry. No rash noted. No erythema.  Psychiatric: She has a normal mood and affect. Her behavior is normal.    ED Course  Procedures (including critical care time) Labs Review Labs Reviewed  CBC WITH DIFFERENTIAL  COMPREHENSIVE METABOLIC PANEL  LIPASE, BLOOD  URINALYSIS, ROUTINE W REFLEX MICROSCOPIC   Imaging Review No results found.  EKG Interpretation   None       MDM  Patient likely has NSAID-induced gastritis. Will check basic labs and treat with GI cocktail and PPI.    Loren Racer, MD 04/27/13 [phone]

## 2013-04-24 NOTE — Discharge Instructions (Signed)
Kidney Stones Kidney stones (urolithiasis) are deposits that form inside your kidneys. The intense pain is caused by the stone moving through the urinary tract. When the stone moves, the ureter goes into spasm around the stone. The stone is usually passed in the urine.  CAUSES   A disorder that makes certain neck glands produce too much parathyroid hormone (primary hyperparathyroidism).  A buildup of uric acid crystals, similar to gout in your joints.  Narrowing (stricture) of the ureter.  A kidney obstruction present at birth (congenital obstruction).  Previous surgery on the kidney or ureters.  Numerous kidney infections. SYMPTOMS   Feeling sick to your stomach (nauseous).  Throwing up (vomiting).  Blood in the urine (hematuria).  Pain that usually spreads (radiates) to the groin.  Frequency or urgency of urination. DIAGNOSIS   Taking a history and physical exam.  Blood or urine tests.  CT scan.  Occasionally, an examination of the inside of the urinary bladder (cystoscopy) is performed. TREATMENT   Observation.  Increasing your fluid intake.  Extracorporeal shock wave lithotripsy This is a noninvasive procedure that uses shock waves to break up kidney stones.  Surgery may be needed if you have severe pain or persistent obstruction. There are various surgical procedures. Most of the procedures are performed with the use of small instruments. Only small incisions are needed to accommodate these instruments, so recovery time is minimized. The size, location, and chemical composition are all important variables that will determine the proper choice of action for you. Talk to your health care provider to better understand your situation so that you will minimize the risk of injury to yourself and your kidney.  HOME CARE INSTRUCTIONS   Drink enough water and fluids to keep your urine clear or pale yellow. This will help you to pass the stone or stone fragments.  Strain  all urine through the provided strainer. Keep all particulate matter and stones for your health care provider to see. The stone causing the pain may be as small as a grain of salt. It is very important to use the strainer each and every time you pass your urine. The collection of your stone will allow your health care provider to analyze it and verify that a stone has actually passed. The stone analysis will often identify what you can do to reduce the incidence of recurrences.  Only take over-the-counter or prescription medicines for pain, discomfort, or fever as directed by your health care provider.  Make a follow-up appointment with your health care provider as directed.  Get follow-up X-rays if required. The absence of pain does not always mean that the stone has passed. It may have only stopped moving. If the urine remains completely obstructed, it can cause loss of kidney function or even complete destruction of the kidney. It is your responsibility to make sure X-rays and follow-ups are completed. Ultrasounds of the kidney can show blockages and the status of the kidney. Ultrasounds are not associated with any radiation and can be performed easily in a matter of minutes. SEEK MEDICAL CARE IF:  You experience pain that is progressive and unresponsive to any pain medicine you have been prescribed. SEEK IMMEDIATE MEDICAL CARE IF:   Pain cannot be controlled with the prescribed medicine.  You have a fever or shaking chills.  The severity or intensity of pain increases over 18 hours and is not relieved by pain medicine.  You develop a new onset of abdominal pain.  You feel faint or pass  out.  You are unable to urinate. MAKE SURE YOU:   Understand these instructions.  Will watch your condition.  Will get help right away if you are not doing well or get worse. Document Released: 05/09/2005 Document Revised: 01/09/2013 Document Reviewed: 10/10/2012 [facility] Patient Information 2014  [place], [place].     Narcotic and benzodiazepine use may cause drowsiness, slowed breathing or dependence.  Please use with caution and do not drive, operate machinery or watch young children alone while taking them.  Taking combinations of these medications or drinking alcohol will potentiate these effects.

## 2013-04-30 ENCOUNTER — Other Ambulatory Visit: Payer: Self-pay | Admitting: Urology

## 2013-05-01 ENCOUNTER — Encounter (HOSPITAL_BASED_OUTPATIENT_CLINIC_OR_DEPARTMENT_OTHER): Payer: Self-pay | Admitting: *Deleted

## 2013-05-01 NOTE — Progress Notes (Signed)
NPO AFTER MN WITH EXCEPTION CLEAR LIQUIDS UNTIL 0900 (NO CREAM/ MILK PRODUCTS).ARRIVE AT 1330. NEEDS ISTAT AND URINE PREG. CURRENT EKG IN EPIC AND CHART. WILL TAKE NORVASC, PROTONIX, PROZAC, AND ZOLOFT AM DOS W/ SIPS OF WATER.  MAY TAKE HYDROCODONE / PHENERGAN IF NEEDED .

## 2013-05-02 ENCOUNTER — Ambulatory Visit (HOSPITAL_BASED_OUTPATIENT_CLINIC_OR_DEPARTMENT_OTHER)
Admission: RE | Admit: 2013-05-02 | Discharge: 2013-05-02 | Disposition: A | Discharge status: Home / Self Care | Payer: Medicaid Other | Source: Ambulatory Visit | Attending: Urology | Admitting: Urology

## 2013-05-02 ENCOUNTER — Encounter (HOSPITAL_BASED_OUTPATIENT_CLINIC_OR_DEPARTMENT_OTHER)
Admission: RE | Disposition: A | Discharge status: Home / Self Care | Payer: Self-pay | Source: Ambulatory Visit | Attending: Urology

## 2013-05-02 ENCOUNTER — Encounter (HOSPITAL_BASED_OUTPATIENT_CLINIC_OR_DEPARTMENT_OTHER): Payer: Medicaid Other | Admitting: Anesthesiology

## 2013-05-02 ENCOUNTER — Encounter (HOSPITAL_BASED_OUTPATIENT_CLINIC_OR_DEPARTMENT_OTHER): Payer: Self-pay | Admitting: Anesthesiology

## 2013-05-02 ENCOUNTER — Ambulatory Visit (HOSPITAL_BASED_OUTPATIENT_CLINIC_OR_DEPARTMENT_OTHER): Payer: Medicaid Other | Admitting: Anesthesiology

## 2013-05-02 DIAGNOSIS — J45909 Unspecified asthma, uncomplicated: Secondary | ICD-10-CM | POA: Insufficient documentation

## 2013-05-02 DIAGNOSIS — N201 Calculus of ureter: Secondary | ICD-10-CM

## 2013-05-02 DIAGNOSIS — K219 Gastro-esophageal reflux disease without esophagitis: Secondary | ICD-10-CM | POA: Insufficient documentation

## 2013-05-02 DIAGNOSIS — I10 Essential (primary) hypertension: Secondary | ICD-10-CM | POA: Insufficient documentation

## 2013-05-02 DIAGNOSIS — E119 Type 2 diabetes mellitus without complications: Secondary | ICD-10-CM | POA: Insufficient documentation

## 2013-05-02 DIAGNOSIS — Z79899 Other long term (current) drug therapy: Secondary | ICD-10-CM | POA: Insufficient documentation

## 2013-05-02 HISTORY — DX: Depression, unspecified: F32.A

## 2013-05-02 HISTORY — DX: Gastro-esophageal reflux disease without esophagitis: K21.9

## 2013-05-02 HISTORY — DX: Anxiety disorder, unspecified: F41.9

## 2013-05-02 HISTORY — DX: Major depressive disorder, single episode, unspecified: F32.9

## 2013-05-02 HISTORY — PX: CYSTOSCOPY WITH STENT PLACEMENT: SHX5790

## 2013-05-02 HISTORY — DX: Personal history of other diseases of the respiratory system: Z87.09

## 2013-05-02 HISTORY — PX: CYSTOSCOPY WITH RETROGRADE PYELOGRAM, URETEROSCOPY AND STENT PLACEMENT: SHX5789

## 2013-05-02 HISTORY — DX: Presence of spectacles and contact lenses: Z97.3

## 2013-05-02 HISTORY — DX: Obstructive sleep apnea (adult) (pediatric): G47.33

## 2013-05-02 HISTORY — DX: Frequency of micturition: R35.0

## 2013-05-02 HISTORY — PX: HOLMIUM LASER APPLICATION: SHX5852

## 2013-05-02 HISTORY — DX: Calculus of ureter: N20.1

## 2013-05-02 LAB — POCT I-STAT 4, (NA,K, GLUC, HGB,HCT)
HCT: 33 % — ABNORMAL LOW (ref 36.0–46.0)
Potassium: 4.1 mEq/L (ref 3.5–5.1)
Sodium: 140 mEq/L (ref 135–145)

## 2013-05-02 LAB — POCT PREGNANCY, URINE: Preg Test, Ur: NEGATIVE

## 2013-05-02 SURGERY — CYSTOURETEROSCOPY, WITH RETROGRADE PYELOGRAM AND STENT INSERTION
Anesthesia: General | Site: Ureter | Laterality: Left

## 2013-05-02 MED ORDER — LACTATED RINGERS IV SOLN
INTRAVENOUS | Status: DC
  Administered 2013-05-02: 17:00:00 via INTRAVENOUS
  Filled 2013-05-02: qty 1000

## 2013-05-02 MED ORDER — METOCLOPRAMIDE HCL 5 MG/ML IJ SOLN
INTRAMUSCULAR | Status: DC | PRN
  Administered 2013-05-02: 10 mg via INTRAVENOUS

## 2013-05-02 MED ORDER — FENTANYL CITRATE 0.05 MG/ML IJ SOLN
INTRAMUSCULAR | Status: AC
  Filled 2013-05-02: qty 8

## 2013-05-02 MED ORDER — LIDOCAINE HCL (CARDIAC) 20 MG/ML IV SOLN
INTRAVENOUS | Status: DC | PRN
  Administered 2013-05-02: 100 mg via INTRAVENOUS

## 2013-05-02 MED ORDER — DEXAMETHASONE SODIUM PHOSPHATE 4 MG/ML IJ SOLN
INTRAMUSCULAR | Status: DC | PRN
  Administered 2013-05-02: 10 mg via INTRAVENOUS

## 2013-05-02 MED ORDER — LACTATED RINGERS IV SOLN
INTRAVENOUS | Status: DC
  Administered 2013-05-02: 14:00:00 via INTRAVENOUS
  Filled 2013-05-02: qty 1000

## 2013-05-02 MED ORDER — MEPERIDINE HCL 25 MG/ML IJ SOLN
6.2500 mg | INTRAMUSCULAR | Status: DC | PRN
  Filled 2013-05-02: qty 1

## 2013-05-02 MED ORDER — MIDAZOLAM HCL 2 MG/2ML IJ SOLN
INTRAMUSCULAR | Status: AC
  Filled 2013-05-02: qty 2

## 2013-05-02 MED ORDER — ONDANSETRON HCL 4 MG/2ML IJ SOLN
INTRAMUSCULAR | Status: DC | PRN
  Administered 2013-05-02: 4 mg via INTRAVENOUS

## 2013-05-02 MED ORDER — LIDOCAINE HCL 2 % EX GEL
CUTANEOUS | Status: DC | PRN
  Administered 2013-05-02: 1

## 2013-05-02 MED ORDER — PROPOFOL 10 MG/ML IV BOLUS
INTRAVENOUS | Status: DC | PRN
  Administered 2013-05-02: 300 mg via INTRAVENOUS

## 2013-05-02 MED ORDER — SODIUM CHLORIDE 0.9 % IR SOLN
Status: DC | PRN
  Administered 2013-05-02: 4000 mL

## 2013-05-02 MED ORDER — CIPROFLOXACIN IN D5W 400 MG/200ML IV SOLN
INTRAVENOUS | Status: AC
  Filled 2013-05-02: qty 200

## 2013-05-02 MED ORDER — URIBEL 118 MG PO CAPS: 1.0000 | ORAL_CAPSULE | Freq: Three times a day (TID) | ORAL | Status: DC | PRN

## 2013-05-02 MED ORDER — MIDAZOLAM HCL 5 MG/5ML IJ SOLN
INTRAMUSCULAR | Status: DC | PRN
  Administered 2013-05-02: 2 mg via INTRAVENOUS

## 2013-05-02 MED ORDER — FENTANYL CITRATE 0.05 MG/ML IJ SOLN
INTRAMUSCULAR | Status: DC | PRN
  Administered 2013-05-02 (×2): 50 ug via INTRAVENOUS

## 2013-05-02 MED ORDER — KETOROLAC TROMETHAMINE 30 MG/ML IJ SOLN
INTRAMUSCULAR | Status: DC | PRN
  Administered 2013-05-02: 30 mg via INTRAVENOUS

## 2013-05-02 MED ORDER — IOHEXOL 350 MG/ML SOLN
INTRAVENOUS | Status: DC | PRN
  Administered 2013-05-02: 15 mL

## 2013-05-02 MED ORDER — CIPROFLOXACIN IN D5W 400 MG/200ML IV SOLN
400.0000 mg | INTRAVENOUS | Status: AC
  Administered 2013-05-02: 400 mg via INTRAVENOUS
  Filled 2013-05-02: qty 200

## 2013-05-02 MED ORDER — ACETAMINOPHEN 10 MG/ML IV SOLN
INTRAVENOUS | Status: DC | PRN
  Administered 2013-05-02: 1000 mg via INTRAVENOUS

## 2013-05-02 MED ORDER — PROMETHAZINE HCL 25 MG/ML IJ SOLN
6.2500 mg | INTRAMUSCULAR | Status: DC | PRN
  Filled 2013-05-02: qty 1

## 2013-05-02 MED ORDER — FENTANYL CITRATE 0.05 MG/ML IJ SOLN
25.0000 ug | INTRAMUSCULAR | Status: DC | PRN
  Filled 2013-05-02: qty 1

## 2013-05-02 SURGICAL SUPPLY — 39 items
ADAPTER CATH URET PLST 4-6FR (CATHETERS) IMPLANT
BAG DRAIN URO-CYSTO SKYTR STRL (DRAIN) ×3 IMPLANT
BASKET LASER NITINOL 1.9FR (BASKET) IMPLANT
BASKET STNLS GEMINI 4WIRE 3FR (BASKET) IMPLANT
BASKET ZERO TIP NITINOL 2.4FR (BASKET) ×3 IMPLANT
BENZOIN TINCTURE PRP APPL 2/3 (GAUZE/BANDAGES/DRESSINGS) IMPLANT
CANISTER SUCT LVC 12 LTR MEDI- (MISCELLANEOUS) ×3 IMPLANT
CATH INTERMIT  6FR 70CM (CATHETERS) IMPLANT
CATH URET 5FR 28IN CONE TIP (BALLOONS)
CATH URET 5FR 28IN OPEN ENDED (CATHETERS) IMPLANT
CATH URET 5FR 70CM CONE TIP (BALLOONS) IMPLANT
CLOTH BEACON ORANGE TIMEOUT ST (SAFETY) ×3 IMPLANT
DRAPE CAMERA CLOSED 9X96 (DRAPES) ×3 IMPLANT
DRSG TEGADERM 2-3/8X2-3/4 SM (GAUZE/BANDAGES/DRESSINGS) IMPLANT
FIBER LASER FLEXIVA 1000 (UROLOGICAL SUPPLIES) IMPLANT
FIBER LASER FLEXIVA 200 (UROLOGICAL SUPPLIES) IMPLANT
FIBER LASER FLEXIVA 365 (UROLOGICAL SUPPLIES) ×3 IMPLANT
FIBER LASER FLEXIVA 550 (UROLOGICAL SUPPLIES) IMPLANT
GLOVE BIO SURGEON STRL SZ7.5 (GLOVE) ×3 IMPLANT
GLOVE BIOGEL M STER SZ 6 (GLOVE) ×6 IMPLANT
GLOVE BIOGEL PI IND STRL 6.5 (GLOVE) ×2 IMPLANT
GLOVE BIOGEL PI INDICATOR 6.5 (GLOVE) ×1
GOWN STRL NON-REIN LRG LVL3 (GOWN DISPOSABLE) ×3 IMPLANT
GOWN STRL REIN XL XLG (GOWN DISPOSABLE) ×3 IMPLANT
GUIDEWIRE 0.038 PTFE COATED (WIRE) IMPLANT
GUIDEWIRE ANG ZIPWIRE 038X150 (WIRE) IMPLANT
GUIDEWIRE STR DUAL SENSOR (WIRE) ×3 IMPLANT
IV NS 1000ML (IV SOLUTION) ×1
IV NS 1000ML BAXH (IV SOLUTION) ×2 IMPLANT
IV NS IRRIG 3000ML ARTHROMATIC (IV SOLUTION) ×3 IMPLANT
KIT BALLIN UROMAX 15FX10 (LABEL) IMPLANT
KIT BALLN UROMAX 15FX4 (MISCELLANEOUS) IMPLANT
KIT BALLN UROMAX 26 75X4 (MISCELLANEOUS)
NS IRRIG 500ML POUR BTL (IV SOLUTION) IMPLANT
PACK CYSTOSCOPY (CUSTOM PROCEDURE TRAY) ×3 IMPLANT
SET HIGH PRES BAL DIL (LABEL)
SHEATH ACCESS URETERAL 38CM (SHEATH) ×3 IMPLANT
SHEATH ACCESS URETERAL 54CM (SHEATH) IMPLANT
STENT URET 6FRX24 CONTOUR (STENTS) ×3 IMPLANT

## 2013-05-02 NOTE — H&P (Signed)
History of Present Illness   Monique Holland presents today as a referral from the emergency room. She is currently 47 years of age without prior urologic history. She denies any previous history of nephrolithiasis. She actually showed up at the emergency room primarily complaining of left lower quadrant abdominal discomfort. Urinalysis showed significant microhematuria. Renal function was normal. There was no evidence of acute toxicity. The patient underwent CT imaging without contrast. In the right kidney she had what appeared to be 4 discrete stones. I attempted to review her images on the PACS system but could not get the images up at that time. She had several 1 mm stones and also a 3-4 mm calcification, all within the right kidney. There was no right-sided hydronephrosis but there was felt to be a 4 mm nonobstructing stone at the right ureterovesical junction. On the left side, the patient was noted to have an edematous and hydronephrotic left kidney. There was a 1 mm stone in the left mid ureter. There was also a calcification measuring 6 mm x 6 mm at the left ureteropelvic junction. In summary, the patient had bilateral ureteral calculi. No obstruction on the right side, but evidence of obstruction on the left. She has continued to have intermittent left lower quadrant pain with ongoing nausea. Urinalysis today continues to show significant microhematuria.      Past Medical History Problems  1. History of Anxiety (300.00) 2. History of arthritis (V13.4) 3. History of asthma (V12.69) 4. History of attention deficit hyperactivity disorder (ADHD) (V11.8) 5. History of depression (V11.8) 6. History of diabetes mellitus (V12.29) 7. History of gastroesophageal reflux (GERD) (V12.79) 8. History of hypertension (V12.59) 9. History of schizophrenia (V11.0)  Surgical History Problems  1. History of Cesarean Section  Current Meds 1. ALPRAZolam 0.5 MG Oral Tablet; TAKE 1 TABLET TWICE DAILY;  Therapy:  (Recorded:09Dec2014) to Recorded 2. AmLODIPine Besylate 10 MG Oral Tablet;  Therapy: (Recorded:09Dec2014) to Recorded 3. Bupap 50-300 MG Oral Tablet;  Therapy: (Recorded:09Dec2014) to Recorded 4. Combivent 18-103 MCG/ACT AERO;  Therapy: (Recorded:09Dec2014) to Recorded 5. FLUoxetine HCl - 20 MG Oral Tablet; 2 bid;  Therapy: (Recorded:09Dec2014) to Recorded 6. Ibuprofen 800 MG Oral Tablet;  Therapy: (Recorded:09Dec2014) to Recorded 7. Iron 325 (65 Fe) MG Oral Tablet;  Therapy: (Recorded:09Dec2014) to Recorded 8. Lisinopril-Hydrochlorothiazide 20-25 MG Oral Tablet;  Therapy: (Recorded:09Dec2014) to Recorded 9. Methylphenidate HCl - 10 MG Oral Tablet; TAKE 1 TABLET 3 TIMES DAILY;  Therapy: (Recorded:09Dec2014) to Recorded 10. Pantoprazole Sodium 40 MG Oral Tablet Delayed Release;   Therapy: (Recorded:09Dec2014) to Recorded 11. Propranolol HCl - 80 MG Oral Tablet;   Therapy: (Recorded:09Dec2014) to Recorded 12. SEROquel XR 400 MG Oral Tablet Extended Release 24 Hour;   Therapy: (Recorded:09Dec2014) to Recorded 13. Topiramate 50 MG Oral Tablet;   Therapy: (Recorded:09Dec2014) to Recorded 14. Zoloft 100 MG Oral Tablet;   Therapy: (Recorded:09Dec2014) to Recorded  Allergies Medication  1. No Known Drug Allergies  Family History Problems  1. Family history of Deceased : Mother, Father 2. No pertinent family history  Social History Problems    Denied: History of Alcohol use   Denied: History of Caffeine use   Married   Non-smoker (V49.89)   Number of children   2 sons   Unemployed (V62.0)  Review of Systems Genitourinary, constitutional, skin, eye, otolaryngeal, hematologic/lymphatic, cardiovascular, pulmonary, endocrine, musculoskeletal, gastrointestinal, neurological and psychiatric system(s) were reviewed and pertinent findings if present are noted.  Genitourinary: urinary urgency and nocturia.  Gastrointestinal: nausea, vomiting, heartburn and diarrhea.  Constitutional: no fever and no night sweats.  Eyes: blurred vision.  Endocrine: polydipsia.  Neurological: headache.  Psychiatric: depression and anxiety.    Vitals Vital Signs [Data Includes: Last 1 Day]  Recorded: 09Dec2014 09:55AM  Height: 5 ft 8 in Weight: 328 lb  BMI Calculated: 49.87 BSA Calculated: 2.52 Blood Pressure: 121 / 73 Temperature: 98.5 F Heart Rate: 79  Physical Exam Constitutional: Well nourished and well developed . No acute distress.  ENT:. The ears and nose are normal in appearance.  Neck: The appearance of the neck is normal and no neck mass is present.  Pulmonary: No respiratory distress and normal respiratory rhythm and effort.  Cardiovascular: Heart rate and rhythm are normal . No peripheral edema.  Abdomen: The abdomen is soft and nontender. No masses are palpated. No CVA tenderness. No hernias are palpable. No hepatosplenomegaly noted.  Skin: Normal skin turgor, no visible rash and no visible skin lesions.  Neuro/Psych:. Mood and affect are appropriate.    Results/Data Urine [Data Includes: Last 1 Day]   09Dec2014  COLOR YELLOW   APPEARANCE CLEAR   SPECIFIC GRAVITY 1.020   pH 6.5   GLUCOSE NEG mg/dL  BILIRUBIN NEG   KETONE NEG mg/dL  BLOOD LARGE   PROTEIN NEG mg/dL  UROBILINOGEN 0.2 mg/dL  NITRITE NEG   LEUKOCYTE ESTERASE TRACE   SQUAMOUS EPITHELIAL/HPF RARE   WBC 3-6 WBC/hpf  RBC TNTC RBC/hpf  BACTERIA RARE   CRYSTALS NONE SEEN   CASTS NONE SEEN    Assessment Assessed  1. Nephrolithiasis (592.0) 2. Ureteral calculus (592.1)  Plan Nephrolithiasis  1. KUB; Status:Active; Requested for:09Dec2014;   Discussion/Summary   Monique Holland has continued to have fairly significant ongoing primarily left-sided abdominal discomfort. Recent CT imaging suggested bilateral ureteral stones, but she has been relatively asymptomatic on the right side. On KUB imaging today, there are several suspicious calcifications. She does have a calcification  in the right hemi-pelvis which does appear to be 3-4 mm in size and may indeed represent a distal right ureteral stone. I suspect that stone is non-obstructing, since she has been asymptomatic on that side and there was no hydronephrosis. On the left side, I see 2 calcifications. One appears to be 1-2 mm in size and the other 5-6. These are both now in the distal ureter on the left. I suspect her more proximal stone has indeed migrated. She is told that these stones have about a 50/50 chance of passing. She is not interested in continued attempts at conservative therapy, since she has been hurting on a fairly regular basis over the last 6 days. She is interested in attempting with definitive management. I would plan on cystoscopy with bilateral retrograde pyelography and bilateral ureteroscopy of stones are noted on both sides. Holmium laser lithotripsy may be necessary and we may need temporary double-J stent placement. The chance of success ought to be extremely high in this setting. We will see if it is possible for her to get on the surgical schedule for sometime this week. Hopefully we can find some time on one of the days to go ahead with this to definitively manage this for her. Success rates, risks, benefits discussed.   Signatures Electronically signed by : Barron Alvine, M.D.; Apr 30 2013  5:29PM EST

## 2013-05-02 NOTE — Transfer of Care (Signed)
Immediate Anesthesia Transfer of Care Note  Patient: Monique Holland  Procedure(s) Performed: Procedure(s) (LRB): CYSTOSCOPY WITH RETROGRADE PYELOGRAM, URETEROSCOPY  (Bilateral) HOLMIUM LASER APPLICATION (Bilateral) CYSTOSCOPY WITH STENT PLACEMENT (Left)  Patient Location: PACU  Anesthesia Type: General  Level of Consciousness: awake, alert  and oriented  Airway & Oxygen Therapy: Patient Spontanous Breathing and Patient connected to face mask oxygen  Post-op Assessment: Report given to PACU RN and Post -op Vital signs reviewed and stable  Post vital signs: Reviewed and stable  Complications: No apparent anesthesia complications

## 2013-05-02 NOTE — Op Note (Signed)
Preoperative diagnosis: Left ureteral calculus / possible right ureteral calculus  Postoperative diagnosis: Left ureteral calculus  Procedure:  1. Cystoscopy 2. Left ureteroscopy and stone removal 3. Ureteroscopic laser lithotripsy 4. Left ureteral stent placement (6 [place]) 24 cm with dangle string 5. Bilateral retrograde pyelography with interpretation  Surgeon: Valetta Fuller, MD  Anesthesia: General  Complications: None  Intraoperative findings: Left retrograde pyelography demonstrated a filling defect within the left ureter consistent with the patient's known calculus without other abnormalities. Right retrograde pyelography was also performed utilizing the endhole catheter. I can appreciate no evidence of filling defect obstruction or dilation of that collecting system. There was prompt drainage of the entire right collecting system suggesting no evidence of obstruction/stone.  EBL: Minimal  Specimens: 1. Left ureteral calculus-fragments  Disposition of specimens: [facility] for stone analysis  Indication: Monique Holland is a 47 y.o.   patient with urolithiasis. After reviewing the management options for treatment, the patient elected to proceed with the above surgical procedure(s). We have discussed the potential benefits and risks of the procedure, side effects of the proposed treatment, the likelihood of the patient achieving the goals of the procedure, and any potential problems that might occur during the procedure or recuperation. Informed consent has been obtained.  Description of procedure:  The patient was taken to the operating room and general anesthesia was induced.  The patient was placed in the dorsal lithotomy position, prepped and draped in the usual sterile fashion, and preoperative antibiotics were administered. A preoperative time-out was performed.   Cystourethroscopy was performed.  There was no evidence for any bladder tumors, stones, or  other mucosal pathology.    Attention then turned to the left ureteral orifice and a ureteral catheter was used to intubate the ureteral orifice.  Omnipaque contrast was injected through the ureteral catheter and a retrograde pyelogram was performed with findings as dictated above. The same procedure was performed on the right side also with findings as mentioned above.  A 0.38 sensor guidewire was then advanced up the left ureter into the renal pelvis under fluoroscopic guidance. The 6 Fr semirigid ureteroscope was then advanced into the ureter next to the guidewire and the calculus was identified.   The stone was then fragmented with the 365 micron holmium laser fiber on a setting of 0.8 J and frequency of 8 Hz.   All stones were then removed from the ureter with a zero tip nitinol basket.  Reinspection of the ureter revealed no remaining visible stones or fragments.   The wire was then backloaded through the cystoscope and a ureteral stent was advance over the wire using Seldinger technique.  The stent was positioned appropriately under fluoroscopic and cystoscopic guidance.  The wire was then removed with an adequate stent curl noted in the renal pelvis as well as in the bladder.  The bladder was then emptied and the procedure ended.  The patient appeared to tolerate the procedure well and without complications.  The patient was able to be awakened and transferred to the recovery unit in satisfactory condition.

## 2013-05-02 NOTE — Anesthesia Preprocedure Evaluation (Signed)
Anesthesia Evaluation  Patient identified by MRN, date of birth, ID band Patient awake    Reviewed: Allergy & Precautions, H&P , NPO status , Patient's Chart, lab work & pertinent test results  Airway Mallampati: II TM Distance: >3 FB Neck ROM: Full    Dental no notable dental hx.    Pulmonary sleep apnea ,  breath sounds clear to auscultation  Pulmonary exam normal       Cardiovascular hypertension, Pt. on medications Rhythm:Regular Rate:Normal     Neuro/Psych negative neurological ROS  negative psych ROS   GI/Hepatic Neg liver ROS, GERD-  Medicated and Controlled,  Endo/Other  Morbid obesity  Renal/GU negative Renal ROS  negative genitourinary   Musculoskeletal negative musculoskeletal ROS (+)   Abdominal   Peds negative pediatric ROS (+)  Hematology negative hematology ROS (+)   Anesthesia Other Findings Upper front right cap   Reproductive/Obstetrics negative OB ROS                           Anesthesia Physical Anesthesia Plan  ASA: III  Anesthesia Plan: General   Post-op Pain Management:    Induction: Intravenous  Airway Management Planned: LMA and Oral ETT  Additional Equipment:   Intra-op Plan:   Post-operative Plan: Extubation in OR  Informed Consent: I have reviewed the patients History and Physical, chart, labs and discussed the procedure including the risks, benefits and alternatives for the proposed anesthesia with the patient or authorized representative who has indicated his/her understanding and acceptance.   Dental advisory given  Plan Discussed with: CRNA  Anesthesia Plan Comments:         Anesthesia Quick Evaluation

## 2013-05-02 NOTE — Anesthesia Procedure Notes (Signed)
Procedure Name: LMA Insertion Date/Time: 05/02/2013 1:28 PM Performed by: Norva Pavlov Pre-anesthesia Checklist: Patient identified, Emergency Drugs available, Suction available and Patient being monitored Patient Re-evaluated:Patient Re-evaluated prior to inductionOxygen Delivery Method: Circle System Utilized Preoxygenation: Pre-oxygenation with 100% oxygen Intubation Type: IV induction Ventilation: Mask ventilation without difficulty LMA: LMA with gastric port inserted LMA Size: 5.0 Number of attempts: 1 Placement Confirmation: positive ETCO2 Tube secured with: Tape Dental Injury: Teeth and Oropharynx as per pre-operative assessment

## 2013-05-03 ENCOUNTER — Encounter (HOSPITAL_BASED_OUTPATIENT_CLINIC_OR_DEPARTMENT_OTHER): Payer: Self-pay | Admitting: Urology

## 2013-05-03 NOTE — Anesthesia Postprocedure Evaluation (Signed)
  Anesthesia Post-op Note  Patient: Monique Holland  Procedure(s) Performed: Procedure(s) (LRB): CYSTOSCOPY WITH RETROGRADE PYELOGRAM, URETEROSCOPY  (Bilateral) HOLMIUM LASER APPLICATION (Bilateral) CYSTOSCOPY WITH STENT PLACEMENT (Left)  Patient Location: PACU  Anesthesia Type: General  Level of Consciousness: awake and alert   Airway and Oxygen Therapy: Patient Spontanous Breathing  Post-op Pain: mild  Post-op Assessment: Post-op Vital signs reviewed, Patient's Cardiovascular Status Stable, Respiratory Function Stable, Patent Airway and No signs of Nausea or vomiting  Last Vitals:  Filed Vitals:   05/02/13 1803  BP: 129/71  Pulse: 85  Temp: 36.7 C  Resp: 18    Post-op Vital Signs: stable   Complications: No apparent anesthesia complications

## 2013-05-29 ENCOUNTER — Other Ambulatory Visit: Payer: Self-pay | Admitting: Specialist

## 2013-05-29 DIAGNOSIS — Z1231 Encounter for screening mammogram for malignant neoplasm of breast: Secondary | ICD-10-CM

## 2013-06-14 ENCOUNTER — Ambulatory Visit: Payer: Medicaid Other

## 2013-06-24 ENCOUNTER — Ambulatory Visit: Payer: Medicaid Other

## 2013-09-05 ENCOUNTER — Ambulatory Visit: Payer: Medicaid Other

## 2013-10-24 ENCOUNTER — Ambulatory Visit: Payer: Medicaid Other

## 2013-10-28 ENCOUNTER — Inpatient Hospital Stay: Admission: RE | Admit: 2013-10-28 | Payer: Medicaid Other | Source: Ambulatory Visit

## 2014-03-16 ENCOUNTER — Encounter (HOSPITAL_COMMUNITY): Payer: Self-pay | Admitting: Emergency Medicine

## 2014-03-16 ENCOUNTER — Emergency Department (HOSPITAL_COMMUNITY)
Admission: EM | Admit: 2014-03-16 | Discharge: 2014-03-17 | Disposition: A | Discharge status: Home / Self Care | Payer: Medicaid Other | Attending: Emergency Medicine | Admitting: Emergency Medicine

## 2014-03-16 DIAGNOSIS — K219 Gastro-esophageal reflux disease without esophagitis: Secondary | ICD-10-CM | POA: Insufficient documentation

## 2014-03-16 DIAGNOSIS — I10 Essential (primary) hypertension: Secondary | ICD-10-CM | POA: Insufficient documentation

## 2014-03-16 DIAGNOSIS — N201 Calculus of ureter: Secondary | ICD-10-CM | POA: Insufficient documentation

## 2014-03-16 DIAGNOSIS — Z3202 Encounter for pregnancy test, result negative: Secondary | ICD-10-CM | POA: Insufficient documentation

## 2014-03-16 DIAGNOSIS — Z79899 Other long term (current) drug therapy: Secondary | ICD-10-CM | POA: Insufficient documentation

## 2014-03-16 DIAGNOSIS — J45909 Unspecified asthma, uncomplicated: Secondary | ICD-10-CM | POA: Diagnosis not present

## 2014-03-16 DIAGNOSIS — G4733 Obstructive sleep apnea (adult) (pediatric): Secondary | ICD-10-CM | POA: Diagnosis not present

## 2014-03-16 DIAGNOSIS — F419 Anxiety disorder, unspecified: Secondary | ICD-10-CM | POA: Insufficient documentation

## 2014-03-16 DIAGNOSIS — F209 Schizophrenia, unspecified: Secondary | ICD-10-CM | POA: Diagnosis not present

## 2014-03-16 DIAGNOSIS — F329 Major depressive disorder, single episode, unspecified: Secondary | ICD-10-CM | POA: Diagnosis not present

## 2014-03-16 DIAGNOSIS — Z9981 Dependence on supplemental oxygen: Secondary | ICD-10-CM | POA: Diagnosis not present

## 2014-03-16 DIAGNOSIS — R109 Unspecified abdominal pain: Secondary | ICD-10-CM

## 2014-03-16 LAB — CBC WITH DIFFERENTIAL/PLATELET
BASOS ABS: 0 10*3/uL (ref 0.0–0.1)
Basophils Relative: 0 % (ref 0–1)
EOS ABS: 0.2 10*3/uL (ref 0.0–0.7)
EOS PCT: 3 % (ref 0–5)
HCT: 30.9 % — ABNORMAL LOW (ref 36.0–46.0)
Hemoglobin: 9.6 g/dL — ABNORMAL LOW (ref 12.0–15.0)
LYMPHS ABS: 1.6 10*3/uL (ref 0.7–4.0)
LYMPHS PCT: 28 % (ref 12–46)
MCH: 23.2 pg — ABNORMAL LOW (ref 26.0–34.0)
MCHC: 31.1 g/dL (ref 30.0–36.0)
MCV: 74.8 fL — AB (ref 78.0–100.0)
Monocytes Absolute: 0.5 10*3/uL (ref 0.1–1.0)
Monocytes Relative: 9 % (ref 3–12)
NEUTROS PCT: 60 % (ref 43–77)
Neutro Abs: 3.4 10*3/uL (ref 1.7–7.7)
Platelets: 247 10*3/uL (ref 150–400)
RBC: 4.13 MIL/uL (ref 3.87–5.11)
RDW: 15.2 % (ref 11.5–15.5)
WBC: 5.8 10*3/uL (ref 4.0–10.5)

## 2014-03-16 MED ORDER — ONDANSETRON HCL 4 MG/2ML IJ SOLN
4.0000 mg | Freq: Once | INTRAMUSCULAR | Status: AC
  Administered 2014-03-16: 4 mg via INTRAVENOUS
  Filled 2014-03-16: qty 2

## 2014-03-16 MED ORDER — FENTANYL CITRATE 0.05 MG/ML IJ SOLN
50.0000 ug | Freq: Once | INTRAMUSCULAR | Status: AC
  Administered 2014-03-16: 50 ug via INTRAVENOUS
  Filled 2014-03-16: qty 2

## 2014-03-16 NOTE — ED Notes (Signed)
Bed: WA25 Expected date:  Expected time:  Means of arrival:  Comments: EMS-flank pain 

## 2014-03-16 NOTE — ED Notes (Signed)
Per EMS, pt has had hematuria x 4 days and began having left flank pain today. Pt has hx of kidney stone and states "this feels the same."  60 mg Toradol IM given enroute.

## 2014-03-17 ENCOUNTER — Emergency Department (HOSPITAL_COMMUNITY): Payer: Medicaid Other

## 2014-03-17 LAB — COMPREHENSIVE METABOLIC PANEL
ALK PHOS: 49 U/L (ref 39–117)
ALT: 45 U/L — ABNORMAL HIGH (ref 0–35)
AST: 45 U/L — AB (ref 0–37)
Albumin: 3.6 g/dL (ref 3.5–5.2)
Anion gap: 12 (ref 5–15)
BUN: 15 mg/dL (ref 6–23)
CALCIUM: 11.6 mg/dL — AB (ref 8.4–10.5)
CO2: 22 mEq/L (ref 19–32)
Chloride: 104 mEq/L (ref 96–112)
Creatinine, Ser: 0.85 mg/dL (ref 0.50–1.10)
GFR calc Af Amer: >90 mL/min (ref >90)
GFR calc non Af Amer: 81 mL/min — ABNORMAL LOW (ref >90)
GLUCOSE: 168 mg/dL — AB (ref 70–99)
Potassium: 4.1 mEq/L (ref 3.7–5.3)
Sodium: 138 mEq/L (ref 137–147)
TOTAL PROTEIN: 6.9 g/dL (ref 6.0–8.3)
Total Bilirubin: <0.2 mg/dL — ABNORMAL LOW (ref 0.3–1.2)

## 2014-03-17 LAB — POC URINE PREG, ED: PREG TEST UR: NEGATIVE

## 2014-03-17 LAB — URINALYSIS, ROUTINE W REFLEX MICROSCOPIC
Bilirubin Urine: NEGATIVE
GLUCOSE, UA: NEGATIVE mg/dL
Ketones, ur: NEGATIVE mg/dL
NITRITE: NEGATIVE
PH: 6.5 (ref 5.0–8.0)
Protein, ur: 30 mg/dL — AB
SPECIFIC GRAVITY, URINE: 1.019 (ref 1.005–1.030)
Urobilinogen, UA: 1 mg/dL (ref 0.0–1.0)

## 2014-03-17 LAB — LIPASE, BLOOD: Lipase: 29 U/L (ref 11–59)

## 2014-03-17 LAB — URINE MICROSCOPIC-ADD ON

## 2014-03-17 IMAGING — CT CT ABD-PELV W/O CM
1 series · 13 of 16 positions shown, 19 images · non-contrast
Comparison: Prior CT from [DATE]

CLINICAL DATA: Initial evaluation for gross hematuria. Left flank
pain.

EXAM:
CT ABDOMEN AND PELVIS WITHOUT CONTRAST
TECHNIQUE: Multidetector CT imaging of the abdomen and pelvis was performed
following the standard protocol without IV contrast.

[Series 3: lung · axial · 0.80mm/px · z∈[+148,+212]mm · 13 of 16 slices shown, 19 images]
[im 2/16  soft-tissue]
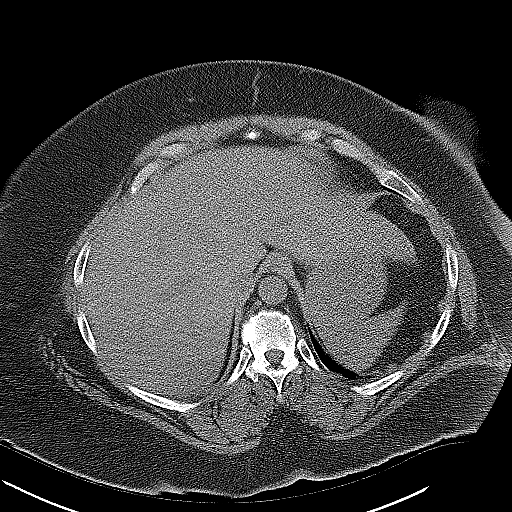
[im 2/16  bone]
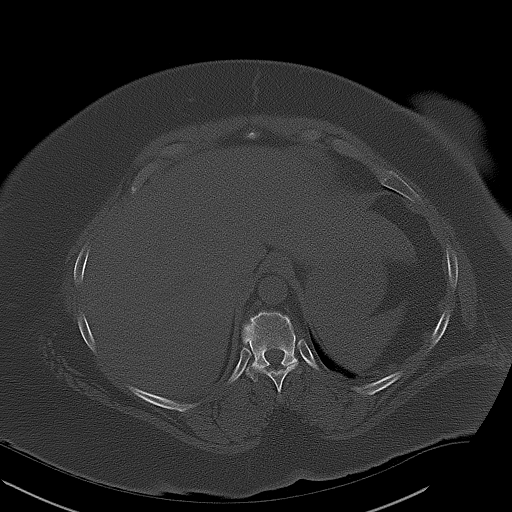
[im 3/16  soft-tissue]
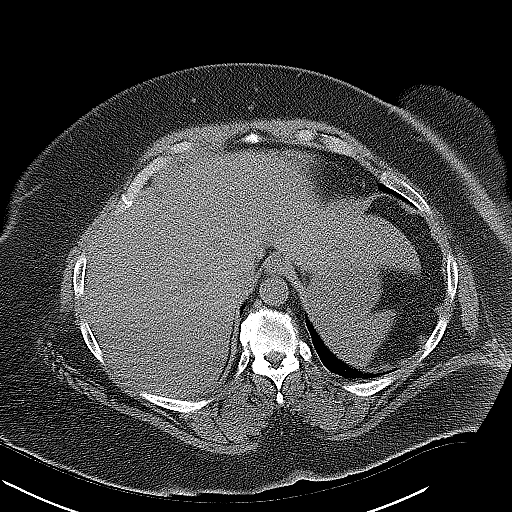
[im 4/16  soft-tissue]
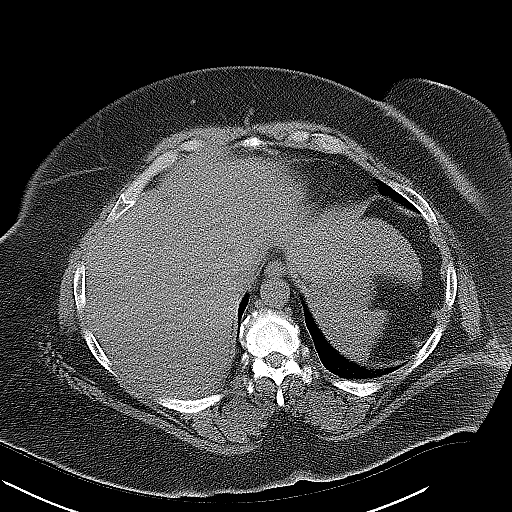
[im 5/16  soft-tissue]
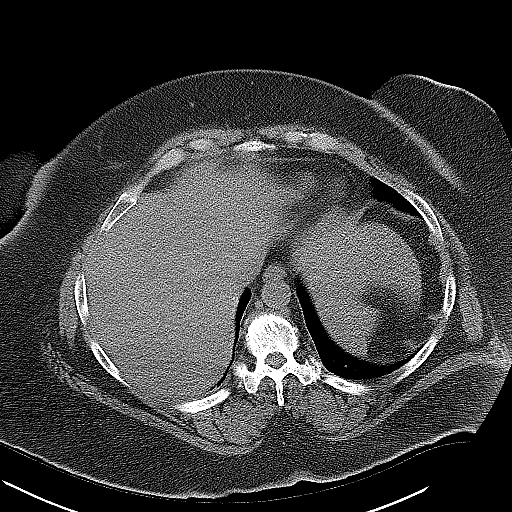
[im 6/16  soft-tissue]
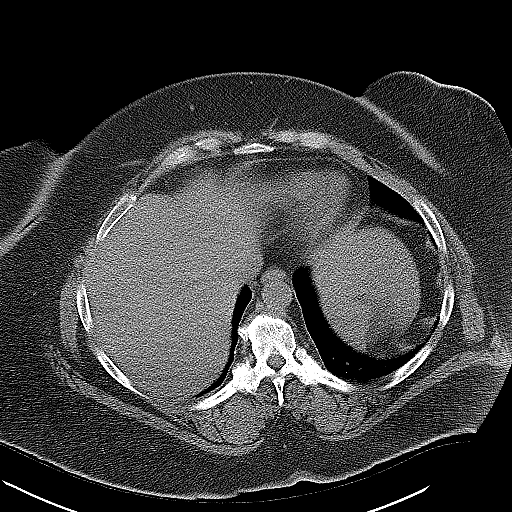
[im 7/16  soft-tissue]
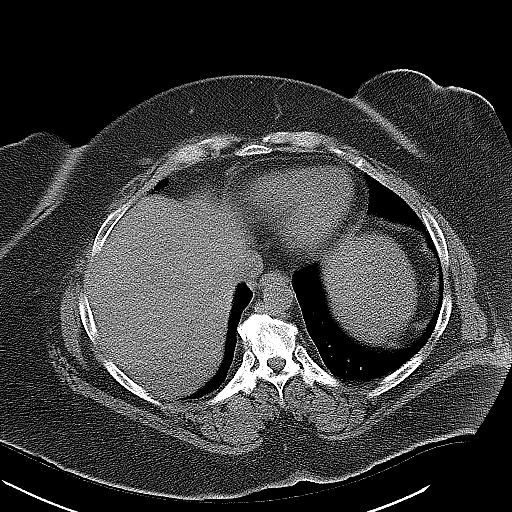
[im 9/16  soft-tissue]
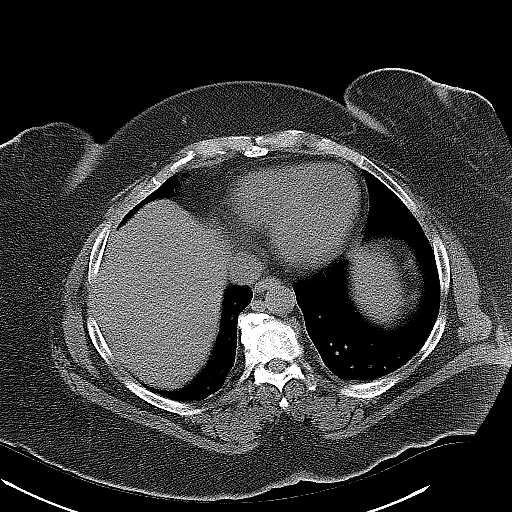
[im 10/16  soft-tissue]
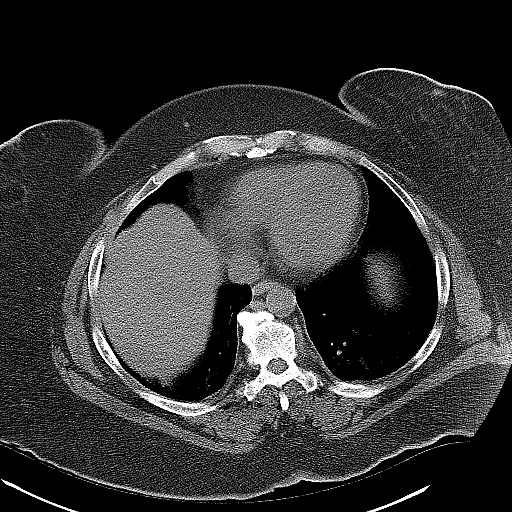
[im 11/16  soft-tissue]
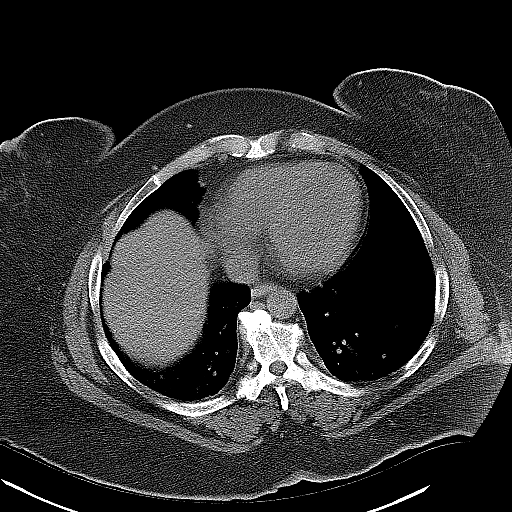
[im 11/16  bone]
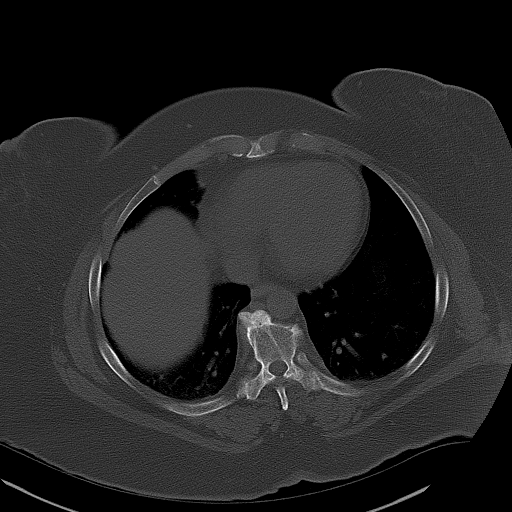
[im 12/16  soft-tissue]
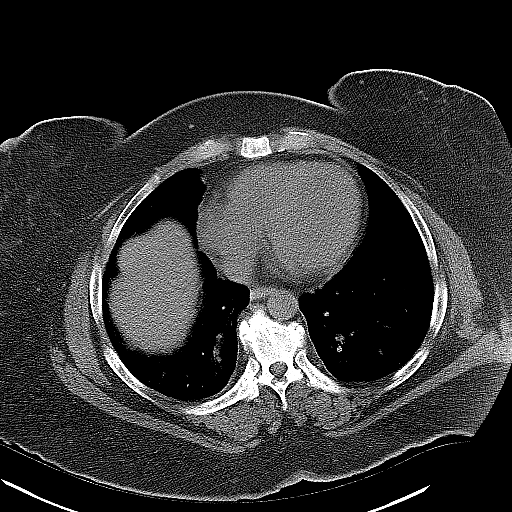
[im 12/16  lung]
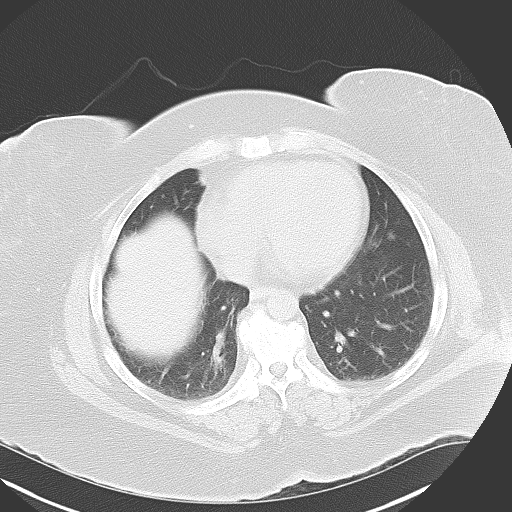
[im 13/16  soft-tissue]
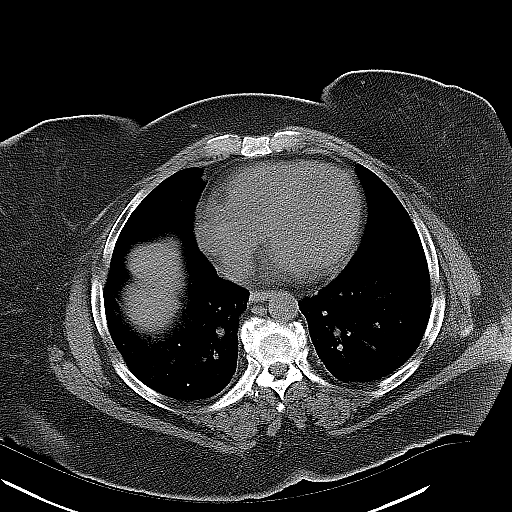
[im 13/16  lung]
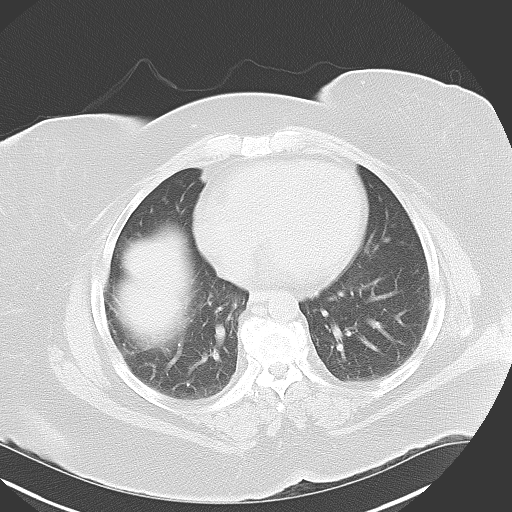
[im 14/16  soft-tissue]
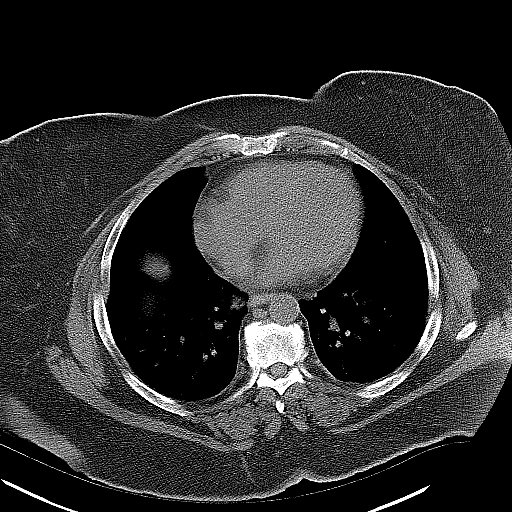
[im 14/16  lung]
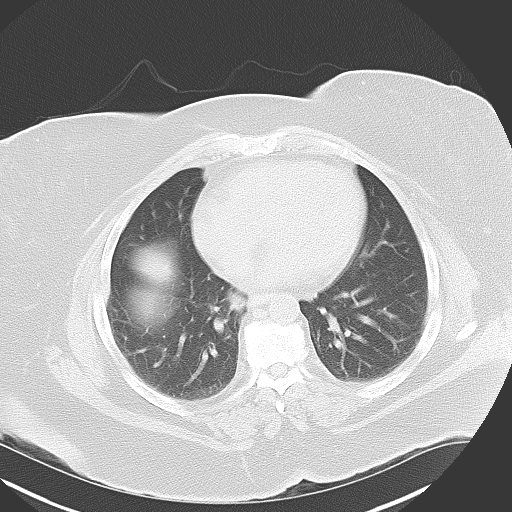
[im 15/16  soft-tissue]
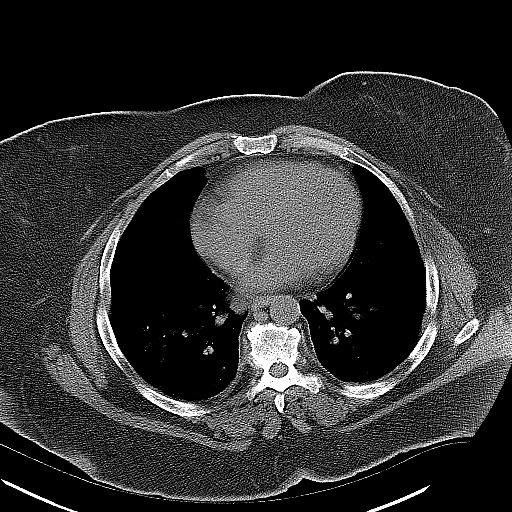
[im 15/16  lung]
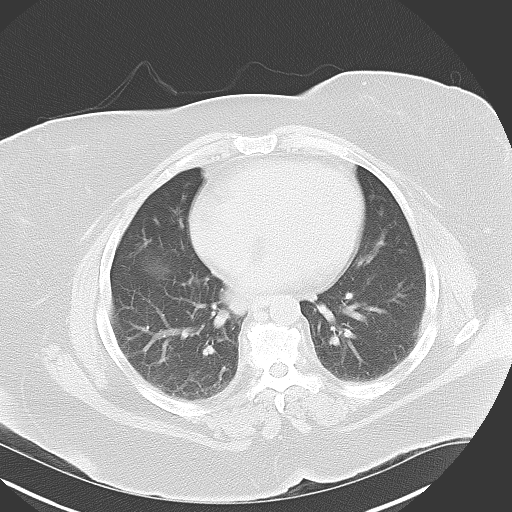

[13 of 16 positions shown; findings below may reference images not displayed]

FINDINGS: The visualized lung bases are clear.

Limited noncontrast evaluation of the liver is unremarkable.
Gallbladder within normal limits. No biliary dilatation. Spleen,
adrenal glands, and pancreas demonstrate a normal unenhanced
appearance.

Nonobstructive calculi measuring up to 8 mm present within the right
kidney. No obstructive stone seen along the course of the right
renal collecting system. There is no right-sided hydronephrosis or
hydroureter.

On the left, there is an obstructive 7 mm calculus within the
proximal left ureter with secondary moderate hydronephrosis. No
other stone seen distally along the course of the left renal
collecting system. Additional nonobstructive calculi measuring up to
1 cm present within the left kidney.

Stomach within normal limits. No evidence of bowel obstruction.
Appendix is normal. No acute inflammatory changes seen about the
bowels.

Bladder within normal limits.  Uterus and ovaries are unremarkable.

No free air or fluid.  No adenopathy.

Chronic bilateral pars defects present at L4-5 with associated
cm of anterolisthesis L4 relative to L5. Severe bilateral facet
arthrosis and degenerative disc disease present at this level. No
acute osseous abnormality. No worrisome lytic or blastic osseous
lesion.
IMPRESSION: 1. 7 mm obstructive stone within the proximal left ureter with
secondary moderate left hydronephrosis.
2. Additional bilateral nonobstructive nephrolithiasis as above.
3. Chronic spondylolisthesis of L4 on L5 measuring 1.1 cm with
associated severe degenerative disc disease and facet arthropathy.

## 2014-03-17 MED ORDER — TAMSULOSIN HCL 0.4 MG PO CAPS: 0.4000 mg | ORAL_CAPSULE | Freq: Two times a day (BID) | ORAL | Status: DC

## 2014-03-17 MED ORDER — OXYCODONE-ACETAMINOPHEN 5-325 MG PO TABS: 1.0000 | ORAL_TABLET | Freq: Four times a day (QID) | ORAL | Status: DC | PRN

## 2014-03-17 MED ORDER — HYDROMORPHONE HCL 1 MG/ML IJ SOLN
1.0000 mg | Freq: Once | INTRAMUSCULAR | Status: AC
  Administered 2014-03-17: 1 mg via INTRAVENOUS
  Filled 2014-03-17: qty 1

## 2014-03-17 MED ORDER — ONDANSETRON HCL 4 MG PO TABS: 4.0000 mg | ORAL_TABLET | Freq: Four times a day (QID) | ORAL | Status: DC

## 2014-03-17 NOTE — ED Notes (Signed)
Patient transported to CT 

## 2014-03-17 NOTE — ED Notes (Signed)
Respirations are 20, even, regular and unlabored.

## 2014-03-17 NOTE — ED Provider Notes (Signed)
Medical screening examination/treatment/procedure(s) were performed by non-physician practitioner and as supervising physician I was immediately available for consultation/collaboration.   Zaneta Lightcap, MD 03/17/14 0710 

## 2014-03-17 NOTE — Discharge Instructions (Signed)

## 2014-03-17 NOTE — ED Notes (Addendum)
Patient returned from CT

## 2014-03-17 NOTE — ED Provider Notes (Signed)
CSN: [id number]     Arrival date & time 03/16/14  2308 History   First MD Initiated Contact with Patient 03/16/14 2352     Chief Complaint  Patient presents with  . Flank Pain     (Consider location/radiation/quality/duration/timing/severity/associated sxs/prior Treatment) HPI Comments: Patient is a 48 year old female with history of hypertension, schizophrenia, kidney stones, obstructive sleep apnea, GERD, anxiety, depression, asthma who presents to the emergency department for evaluation of sudden onset left flank pain. She reports that the pain began a few hours prior to arrival. She reports that she has been having gross hematuria for the past 4 days. She reports that this feels exactly like prior kidney stones. She had associated nausea and vomiting due to the severity of the pain. She received 60 mg of IM Toradol by EMS. She received 50 g of fentanyl on arrival to the emergency department. She is feeling minimal improvement of her symptoms. She denies any fevers, chills, chest pain, shortness of breath.  Patient is a 48 y.o. female presenting with flank pain. The history is provided by the patient. No language interpreter was used.  Flank Pain Associated symptoms include nausea and vomiting. Pertinent negatives include no abdominal pain, chest pain, chills or fever.    Past Medical History  Diagnosis Date  . Hypertension   . Schizophrenia   . Ureteral calculi     BILATERAL  . OSA (obstructive sleep apnea)     pt had study done oct 2014--  schedule for cpap titrate after kidney stone surgery  . Frequency of urination   . GERD (gastroesophageal reflux disease)   . Wears glasses   . History of asthma     last episode yrs ago  . Depression   . Anxiety   . Asthma    Past Surgical History  Procedure Laterality Date  . Cesarean section  1991  &  2002    w/ bilateral tubal ligation in 2002  . Tonsillectomy  age 48  . Cystoscopy with retrograde pyelogram, ureteroscopy and stent  placement Bilateral 05/02/2013    Procedure: CYSTOSCOPY WITH RETROGRADE PYELOGRAM, URETEROSCOPY ;  Surgeon: Valetta Fulleravid S Grapey, MD;  [facility];  Service: Urology;  Laterality: Bilateral;  . Holmium laser application Bilateral 05/02/2013    Procedure: HOLMIUM LASER APPLICATION;  Surgeon: Valetta Fulleravid S Grapey, MD;  [facility];  Service: Urology;  Laterality: Bilateral;  . Cystoscopy with stent placement Left 05/02/2013    Procedure: CYSTOSCOPY WITH STENT PLACEMENT;  Surgeon: Valetta Fulleravid S Grapey, MD;  [facility] [place];  Service: Urology;  Laterality: Left;   No family history on file. History  Substance Use Topics  . Smoking status: Never Smoker   . Smokeless tobacco: Never Used  . Alcohol Use: No   OB History   Grav Para Term Preterm Abortions TAB SAB Ect Mult Living                 Review of Systems  Constitutional: Negative for fever and chills.  Respiratory: Negative for shortness of breath.   Cardiovascular: Negative for chest pain.  Gastrointestinal: Positive for nausea and vomiting. Negative for abdominal pain and diarrhea.  Genitourinary: Positive for hematuria and flank pain. Negative for dysuria.  All other systems reviewed and are negative.     Allergies  Review of patient's allergies indicates no known allergies.  Home Medications   Prior to Admission medications   Medication Sig Start Date End Date Taking? Authorizing Provider  albuterol-ipratropium (COMBIVENT) 18-103 MCG/ACT inhaler Inhale 2 puffs into the lungs every 6 (six) hours as needed for wheezing.   Yes Historical Provider, MD  ALPRAZolam Prudy Feeler) 1 MG tablet Take 1 mg by mouth at bedtime as needed for sleep.   Yes Historical Provider, MD  amitriptyline (ELAVIL) 50 MG tablet Take 50 mg by mouth at bedtime.   Yes Historical Provider, MD  amLODipine (NORVASC) 10 MG tablet Take 10 mg by mouth every morning.    Yes Historical Provider, MD  clonazePAM  (KLONOPIN) 1 MG tablet Take 1 mg by mouth 2 (two) times daily as needed for anxiety.   Yes Historical Provider, MD  FLUoxetine (PROZAC) 20 MG capsule Take 20 mg by mouth daily.   Yes Historical Provider, MD  HYDROcodone-acetaminophen (NORCO/VICODIN) 5-325 MG per tablet 1-2 tablets po q 6 hours prn moderate to severe pain 04/24/13  Yes Gavin Pound. Ghim, MD  ibuprofen (ADVIL,MOTRIN) 200 MG tablet Take 800 mg by mouth every 8 (eight) hours as needed.   Yes Historical Provider, MD  lisinopril-hydrochlorothiazide (PRINZIDE,ZESTORETIC) 20-25 MG per tablet Take 1 tablet by mouth daily.   Yes Historical Provider, MD  Meth-Hyo-M Bl-Na Phos-Ph Sal (URIBEL) 118 MG CAPS Take 1 capsule by mouth 3 (three) times daily as needed (bladder spasms).   Yes Historical Provider, MD  methylphenidate (RITALIN) 20 MG tablet Take 20 mg by mouth 2 (two) times daily.   Yes Historical Provider, MD  pantoprazole (PROTONIX) 20 MG tablet Take 20 mg by mouth every morning.    Yes Historical Provider, MD  QUEtiapine (SEROQUEL XR) 400 MG 24 hr tablet Take 400 mg by mouth at bedtime.    Yes Historical Provider, MD  sertraline (ZOLOFT) 100 MG tablet Take 100 mg by mouth daily.   Yes Historical Provider, MD  tamsulosin (FLOMAX) 0.4 MG CAPS capsule Take 1 capsule (0.4 mg total) by mouth daily after supper. 04/24/13  Yes Gavin Pound. Ghim, MD   BP 125/81  Pulse 85  Temp(Src) 98.3 F (36.8 C) (Oral)  Resp 20  Ht 5\' 8"  (1.727 m)  Wt 330 lb (149.687 kg)  BMI 50.19 kg/m2  SpO2 100%  LMP 03/02/2014 Physical Exam  Nursing note and vitals reviewed. Constitutional: She is oriented to person, place, and time. She appears well-developed and well-nourished. No distress.  Morbidly obese  HENT:  Head: Normocephalic and atraumatic.  Right Ear: External ear normal.  Left Ear: External ear normal.  Nose: Nose normal.  Mouth/Throat: Oropharynx is clear and moist.  Eyes: Conjunctivae are normal.  Neck: Normal range of motion.  Cardiovascular:  Normal rate, regular rhythm and normal heart sounds.   Pulmonary/Chest: Effort normal and breath sounds normal. No stridor. No respiratory distress. She has no wheezes. She has no rales.  Abdominal: Soft. She exhibits no distension. There is no tenderness.  No tenderness to abdomen  Musculoskeletal: Normal range of motion.  Neurological: She is alert and oriented to person, place, and time. She has normal strength.  Skin: Skin is warm and dry. She is not diaphoretic. No erythema.  Psychiatric: She has a normal mood and affect. Her behavior is normal.    ED Course  Procedures (including critical care time) Labs Review Labs Reviewed  CBC WITH DIFFERENTIAL - Abnormal; Notable for the following:    Hemoglobin 9.6 (*)    HCT 30.9 (*)    MCV 74.8 (*)    MCH 23.2 (*)    All other components within normal limits  COMPREHENSIVE METABOLIC PANEL -  Abnormal; Notable for the following:    Glucose, Bld 168 (*)    Calcium 11.6 (*)    AST 45 (*)    ALT 45 (*)    Total Bilirubin <0.2 (*)    GFR calc non Af Amer 81 (*)    All other components within normal limits  URINALYSIS, ROUTINE W REFLEX MICROSCOPIC - Abnormal; Notable for the following:    Color, Urine AMBER (*)    APPearance CLOUDY (*)    Hgb urine dipstick LARGE (*)    Protein, ur 30 (*)    Leukocytes, UA SMALL (*)    All other components within normal limits  LIPASE, BLOOD  URINE MICROSCOPIC-ADD ON  POC URINE PREG, ED    Imaging Review Ct Abdomen Pelvis Wo Contrast  03/17/2014   CLINICAL DATA:  Initial evaluation for gross hematuria. Left flank pain.  EXAM: CT ABDOMEN AND PELVIS WITHOUT CONTRAST  TECHNIQUE: Multidetector CT imaging of the abdomen and pelvis was performed following the standard protocol without IV contrast.  COMPARISON:  Prior CT from 02/04/2014  FINDINGS: The visualized lung bases are clear.  Limited noncontrast evaluation of the liver is unremarkable. Gallbladder within normal limits. No biliary dilatation. Spleen,  adrenal glands, and pancreas demonstrate a normal unenhanced appearance.  Nonobstructive calculi measuring up to 8 mm present within the right kidney. No obstructive stone seen along the course of the right renal collecting system. There is no right-sided hydronephrosis or hydroureter.  On the left, there is an obstructive 7 mm calculus within the proximal left ureter with secondary moderate hydronephrosis. No other stone seen distally along the course of the left renal collecting system. Additional nonobstructive calculi measuring up to 1 cm present within the left kidney.  Stomach within normal limits. No evidence of bowel obstruction. Appendix is normal. No acute inflammatory changes seen about the bowels.  Bladder within normal limits.  Uterus and ovaries are unremarkable.  No free air or fluid.  No adenopathy.  Chronic bilateral pars defects present at L4-5 with associated 1.1 cm of anterolisthesis L4 relative to L5. Severe bilateral facet arthrosis and degenerative disc disease present at this level. No acute osseous abnormality. No worrisome lytic or blastic osseous lesion.  IMPRESSION: 1. 7 mm obstructive stone within the proximal left ureter with secondary moderate left hydronephrosis. 2. Additional bilateral nonobstructive nephrolithiasis as above. 3. Chronic spondylolisthesis of L4 on L5 measuring 1.1 cm with associated severe degenerative disc disease and facet arthropathy.   Electronically Signed   By: Rise MuBenjamin  McClintock M.D.   On: 03/17/2014 01:40     EKG Interpretation None      MDM   Final diagnoses:  Flank pain  Ureteral calculus    Patient presents to emergency department for evaluation of left-sided flank pain. Patient has 7 mm obstructive stone within the proximal left ureter with secondary moderate left hydronephrosis. Creatinine is 0.85. Urine shows no infection. Patient has been seen at [facility] in the past and understand the importance of close follow-up. Pain is  currently controlled. We will discharge home with Percocet and Flomax. Discussed reasons to return to emergency department immediately. Vital signs stable for discharge. Discussed case with Dr. Preston FleetingGlick who agrees with plan. Patient / Family / Caregiver informed of clinical course, understand medical decision-making process, and agree with plan.     Mora BellmanHannah S Keia Rask, PA-C 03/17/14 0225  Mora BellmanHannah S Karlis Cregg, PA-C 03/17/14 0225

## 2014-05-22 ENCOUNTER — Emergency Department (HOSPITAL_COMMUNITY): Payer: Medicaid Other

## 2014-05-22 ENCOUNTER — Observation Stay (HOSPITAL_COMMUNITY)
Admission: EM | Admit: 2014-05-22 | Discharge: 2014-05-23 | Disposition: A | Discharge status: Home / Self Care | Payer: Medicaid Other | Attending: General Surgery | Admitting: General Surgery

## 2014-05-22 ENCOUNTER — Emergency Department (HOSPITAL_COMMUNITY): Payer: Medicaid Other | Admitting: Anesthesiology

## 2014-05-22 ENCOUNTER — Encounter (HOSPITAL_COMMUNITY): Payer: Self-pay

## 2014-05-22 ENCOUNTER — Encounter (HOSPITAL_COMMUNITY)
Admission: EM | Disposition: A | Discharge status: Home / Self Care | Payer: Self-pay | Source: Home / Self Care | Attending: Emergency Medicine

## 2014-05-22 DIAGNOSIS — Z79899 Other long term (current) drug therapy: Secondary | ICD-10-CM | POA: Diagnosis not present

## 2014-05-22 DIAGNOSIS — F419 Anxiety disorder, unspecified: Secondary | ICD-10-CM | POA: Insufficient documentation

## 2014-05-22 DIAGNOSIS — F329 Major depressive disorder, single episode, unspecified: Secondary | ICD-10-CM | POA: Insufficient documentation

## 2014-05-22 DIAGNOSIS — K219 Gastro-esophageal reflux disease without esophagitis: Secondary | ICD-10-CM | POA: Diagnosis not present

## 2014-05-22 DIAGNOSIS — T80818A Extravasation of other vesicant agent, initial encounter: Secondary | ICD-10-CM

## 2014-05-22 DIAGNOSIS — K353 Acute appendicitis with localized peritonitis, without perforation or gangrene: Secondary | ICD-10-CM

## 2014-05-22 DIAGNOSIS — I10 Essential (primary) hypertension: Secondary | ICD-10-CM | POA: Diagnosis not present

## 2014-05-22 DIAGNOSIS — K3580 Unspecified acute appendicitis: Principal | ICD-10-CM | POA: Diagnosis present

## 2014-05-22 DIAGNOSIS — F209 Schizophrenia, unspecified: Secondary | ICD-10-CM | POA: Insufficient documentation

## 2014-05-22 DIAGNOSIS — R1030 Lower abdominal pain, unspecified: Secondary | ICD-10-CM

## 2014-05-22 DIAGNOSIS — J45909 Unspecified asthma, uncomplicated: Secondary | ICD-10-CM | POA: Diagnosis not present

## 2014-05-22 DIAGNOSIS — G4733 Obstructive sleep apnea (adult) (pediatric): Secondary | ICD-10-CM | POA: Diagnosis not present

## 2014-05-22 HISTORY — PX: LAPAROSCOPIC APPENDECTOMY: SHX408

## 2014-05-22 LAB — CBC WITH DIFFERENTIAL/PLATELET
Basophils Absolute: 0 10*3/uL (ref 0.0–0.1)
Basophils Relative: 0 % (ref 0–1)
Eosinophils Absolute: 0.1 10*3/uL (ref 0.0–0.7)
Eosinophils Relative: 2 % (ref 0–5)
HEMATOCRIT: 32.7 % — AB (ref 36.0–46.0)
Hemoglobin: 9.8 g/dL — ABNORMAL LOW (ref 12.0–15.0)
LYMPHS ABS: 1 10*3/uL (ref 0.7–4.0)
Lymphocytes Relative: 14 % (ref 12–46)
MCH: 22.9 pg — AB (ref 26.0–34.0)
MCHC: 30 g/dL (ref 30.0–36.0)
MCV: 76.4 fL — ABNORMAL LOW (ref 78.0–100.0)
MONO ABS: 0.7 10*3/uL (ref 0.1–1.0)
Monocytes Relative: 10 % (ref 3–12)
NEUTROS PCT: 74 % (ref 43–77)
Neutro Abs: 5.6 10*3/uL (ref 1.7–7.7)
Platelets: 265 10*3/uL (ref 150–400)
RBC: 4.28 MIL/uL (ref 3.87–5.11)
RDW: 15.7 % — ABNORMAL HIGH (ref 11.5–15.5)
WBC: 7.5 10*3/uL (ref 4.0–10.5)

## 2014-05-22 LAB — URINALYSIS, ROUTINE W REFLEX MICROSCOPIC
Bilirubin Urine: NEGATIVE
Glucose, UA: NEGATIVE mg/dL
Hgb urine dipstick: NEGATIVE
Ketones, ur: NEGATIVE mg/dL
LEUKOCYTES UA: NEGATIVE
Nitrite: NEGATIVE
Protein, ur: NEGATIVE mg/dL
Specific Gravity, Urine: 1.014 (ref 1.005–1.030)
Urobilinogen, UA: 0.2 mg/dL (ref 0.0–1.0)
pH: 7 (ref 5.0–8.0)

## 2014-05-22 LAB — COMPREHENSIVE METABOLIC PANEL
ALBUMIN: 4.1 g/dL (ref 3.5–5.2)
ALK PHOS: 48 U/L (ref 39–117)
ALT: 52 U/L — AB (ref 0–35)
ANION GAP: 7 (ref 5–15)
AST: 52 U/L — AB (ref 0–37)
BUN: 17 mg/dL (ref 6–23)
CHLORIDE: 107 meq/L (ref 96–112)
CO2: 20 mmol/L (ref 19–32)
Calcium: 11.3 mg/dL — ABNORMAL HIGH (ref 8.4–10.5)
Creatinine, Ser: 0.82 mg/dL (ref 0.50–1.10)
GFR calc Af Amer: >90 mL/min (ref >90)
GFR calc non Af Amer: 84 mL/min — ABNORMAL LOW (ref >90)
Glucose, Bld: 124 mg/dL — ABNORMAL HIGH (ref 70–99)
POTASSIUM: 4.1 mmol/L (ref 3.5–5.1)
Sodium: 134 mmol/L — ABNORMAL LOW (ref 135–145)
TOTAL PROTEIN: 7.3 g/dL (ref 6.0–8.3)
Total Bilirubin: 0.6 mg/dL (ref 0.3–1.2)

## 2014-05-22 LAB — LIPASE, BLOOD: Lipase: 27 U/L (ref 11–59)

## 2014-05-22 LAB — POC URINE PREG, ED: Preg Test, Ur: NEGATIVE

## 2014-05-22 IMAGING — CT CT ABD-PELV W/O CM
1 of 3 series · 14 of 32 positions shown, 19 images · IV contrast (OMNIPAQUE 300)
Comparison: [DATE].

CLINICAL DATA: Bilateral lower abdominal pain, left greater than
right, with nausea.

EXAM:
CT ABDOMEN AND PELVIS WITHOUT CONTRAST
TECHNIQUE: Multidetector CT imaging of the abdomen and pelvis was performed
following the standard protocol without IV contrast.

[Series 2: abd/pel with · axial · 0.93mm/px · z∈[+1374,+1794]mm · 14 of 94 slices shown, 19 images]
[im 5/94  soft-tissue]
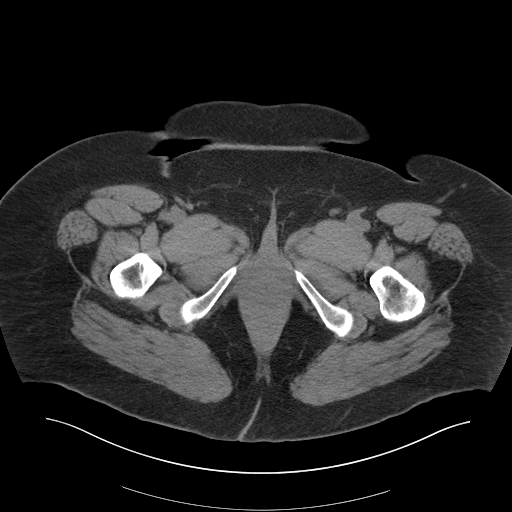
[im 5/94  bone]
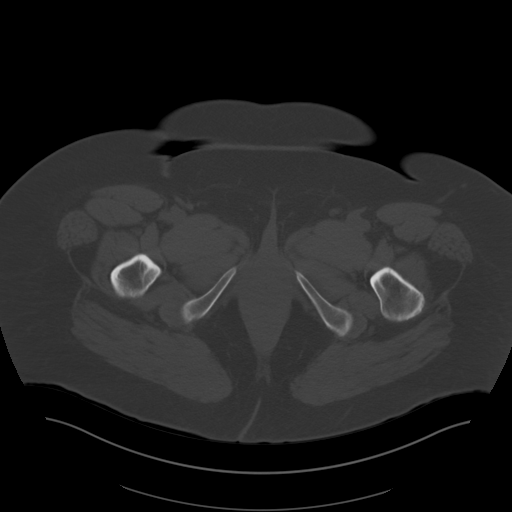
[im 15/94  soft-tissue]
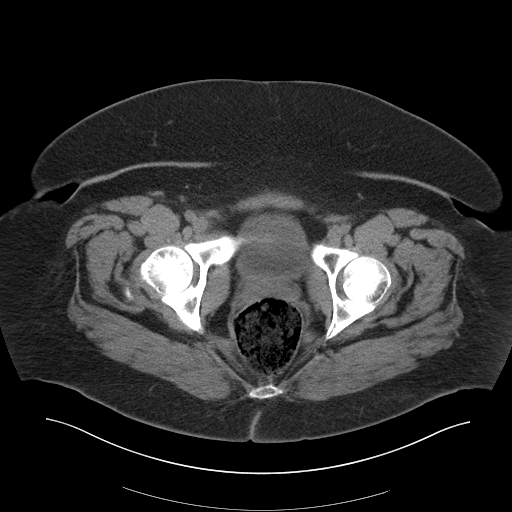
[im 20/94  soft-tissue]
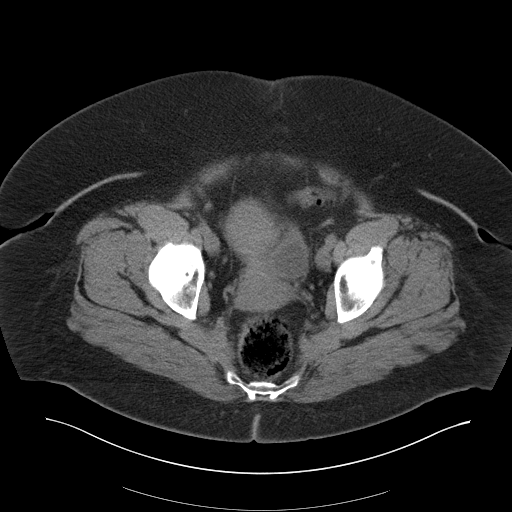
[im 25/94  soft-tissue]
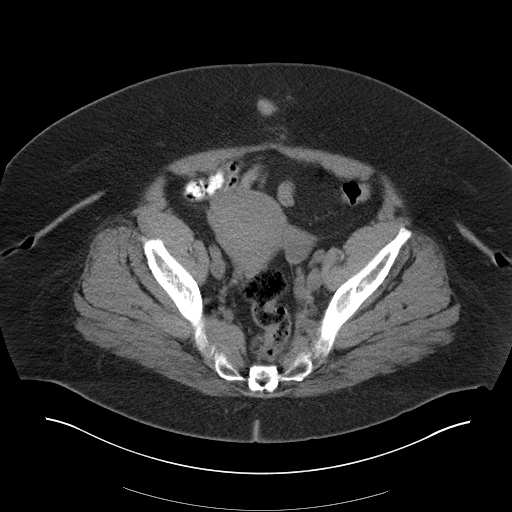
[im 35/94  soft-tissue]
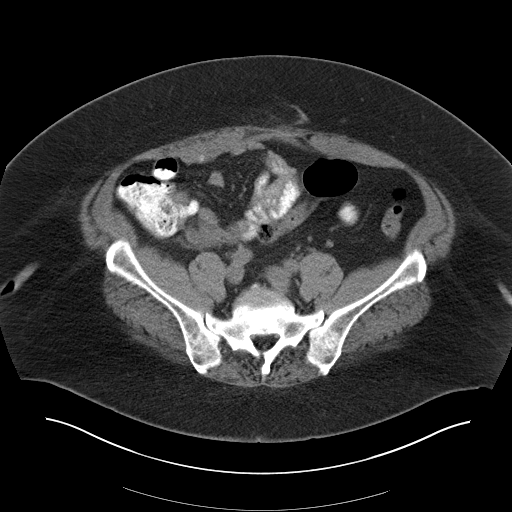
[im 40/94  soft-tissue]
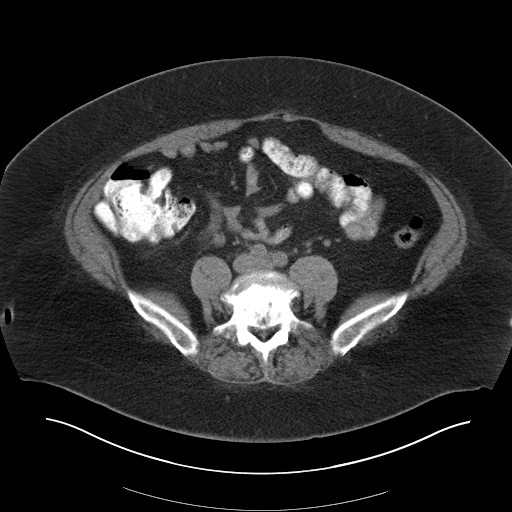
[im 49/94  soft-tissue]
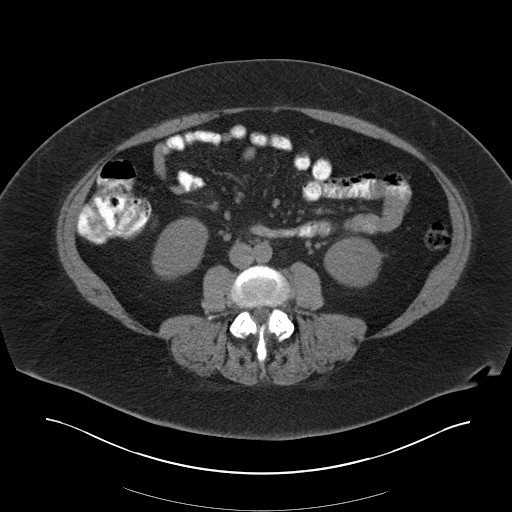
[im 54/94  soft-tissue]
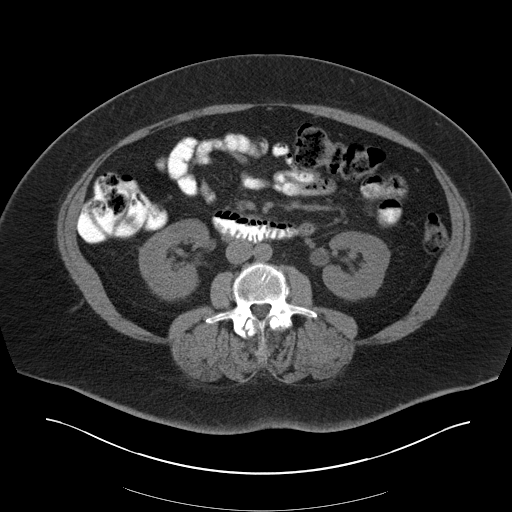
[im 59/94  soft-tissue]
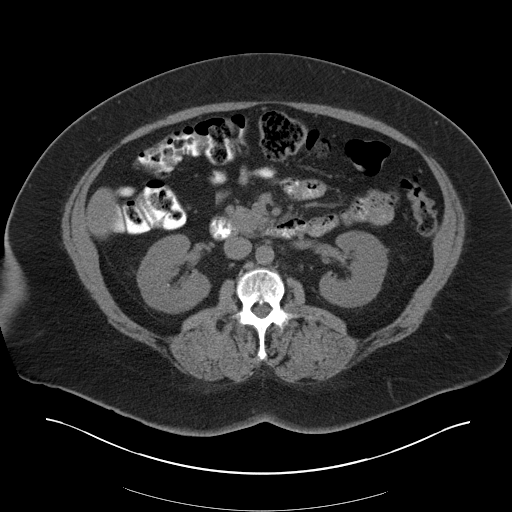
[im 59/94  bone]
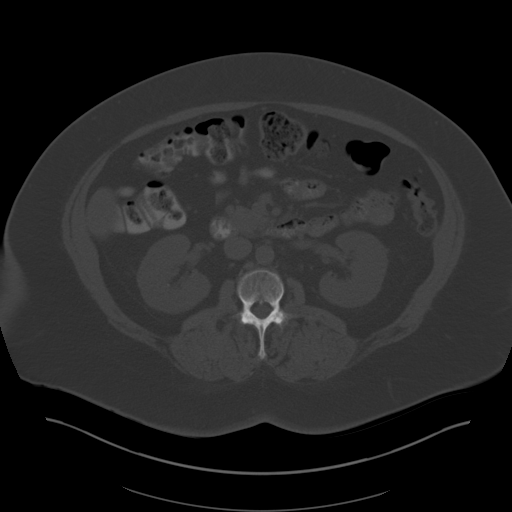
[im 69/94  soft-tissue]
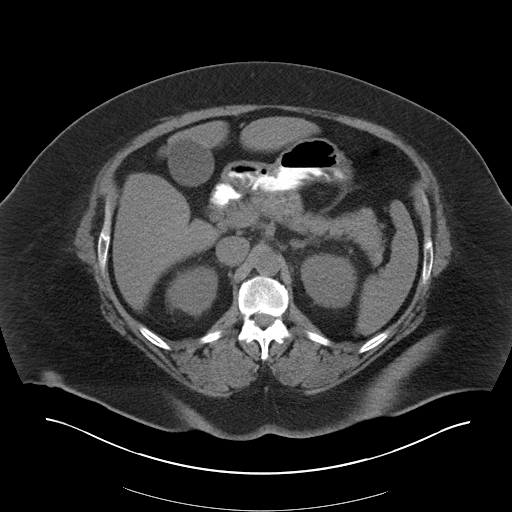
[im 74/94  soft-tissue]
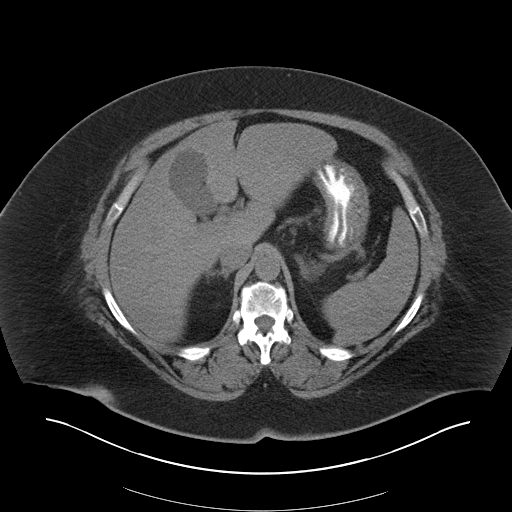
[im 74/94  lung]
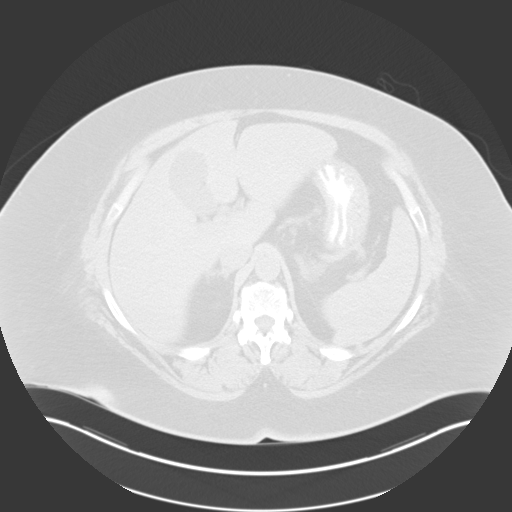
[im 79/94  soft-tissue]
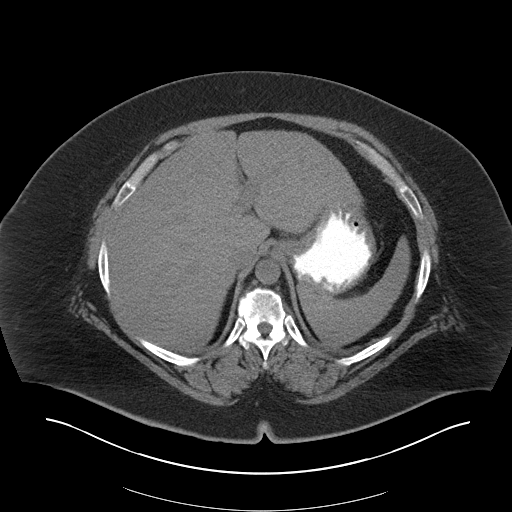
[im 79/94  lung]
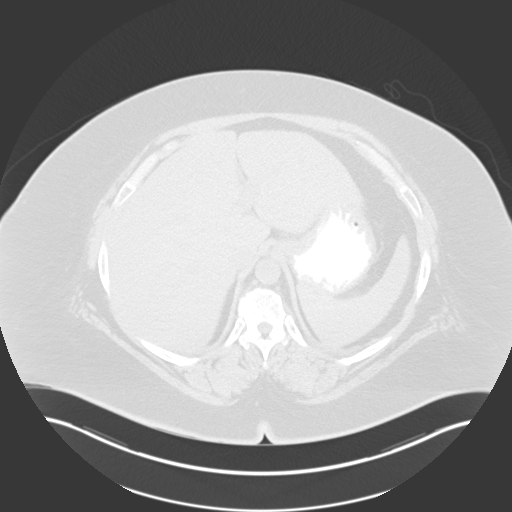
[im 84/94  lung]
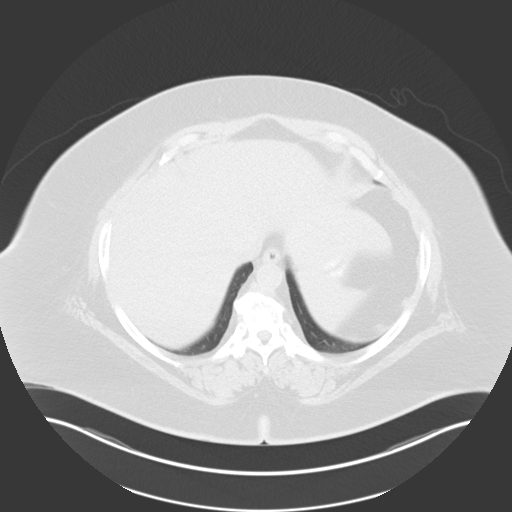
[im 89/94  soft-tissue]
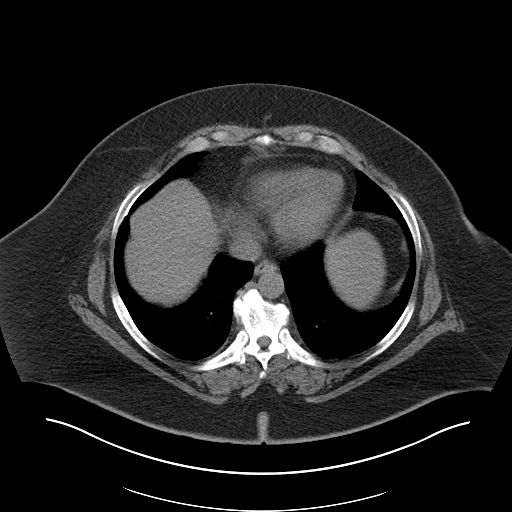
[im 89/94  lung]
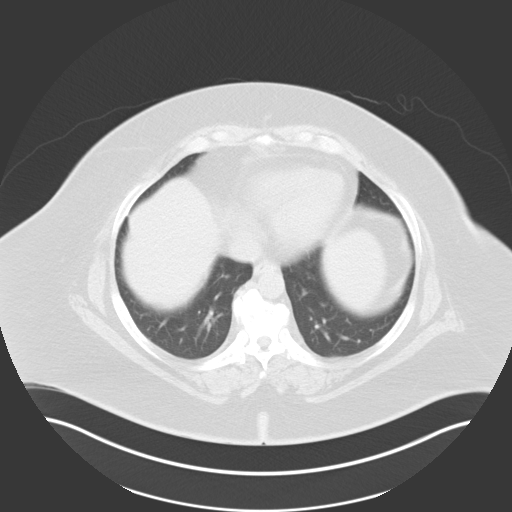

[14 of 32 positions shown; findings below may reference images not displayed]

FINDINGS: Lower chest: Lung bases show no acute findings. Heart size normal.
No pericardial or pleural effusion.

Hepatobiliary: Liver and gallbladder are unremarkable. No biliary
ductal dilatation.

Pancreas: Negative.

Spleen: Negative.

Adrenals/Urinary Tract: Adrenal glands are unremarkable. Small
stones are seen in the kidneys bilaterally which are otherwise
unremarkable. Ureters are decompressed. Bladder is unremarkable.

Stomach/Bowel: Stomach and small bowel are unremarkable. Appendix is
fluid-filled and dilated, measuring 1.2 cm. There is surrounding
inflammatory stranding and fluid. No extraluminal air or organized
fluid collection. Colon is unremarkable.

Vascular/Lymphatic: Vascular structures are unremarkable. No
pathologically enlarged lymph nodes.

Reproductive: Uterus and ovaries are visualized.

Other: No free fluid.  Mesenteries and peritoneum are unremarkable.

Musculoskeletal: No worrisome lytic or sclerotic lesions. Chronic
bilateral L4 pars defects with grade 1 anterolisthesis and advanced
degenerative disc disease.
IMPRESSION: 1. Dilated and fluid-filled appendix, with surrounding inflammatory
changes, most consistent with acute appendicitis. No complicating
features.
2. Bilateral renal stones without obstruction.

## 2014-05-22 IMAGING — CR DG ELBOW 2V*L*
2 series · 2 of 2 positions shown · non-contrast
Comparison: None.

CLINICAL DATA: Contrast extravasation during CT scan.

EXAM:
LEFT ELBOW - 2 VIEW

[x humerus ap left (1 of 2)]
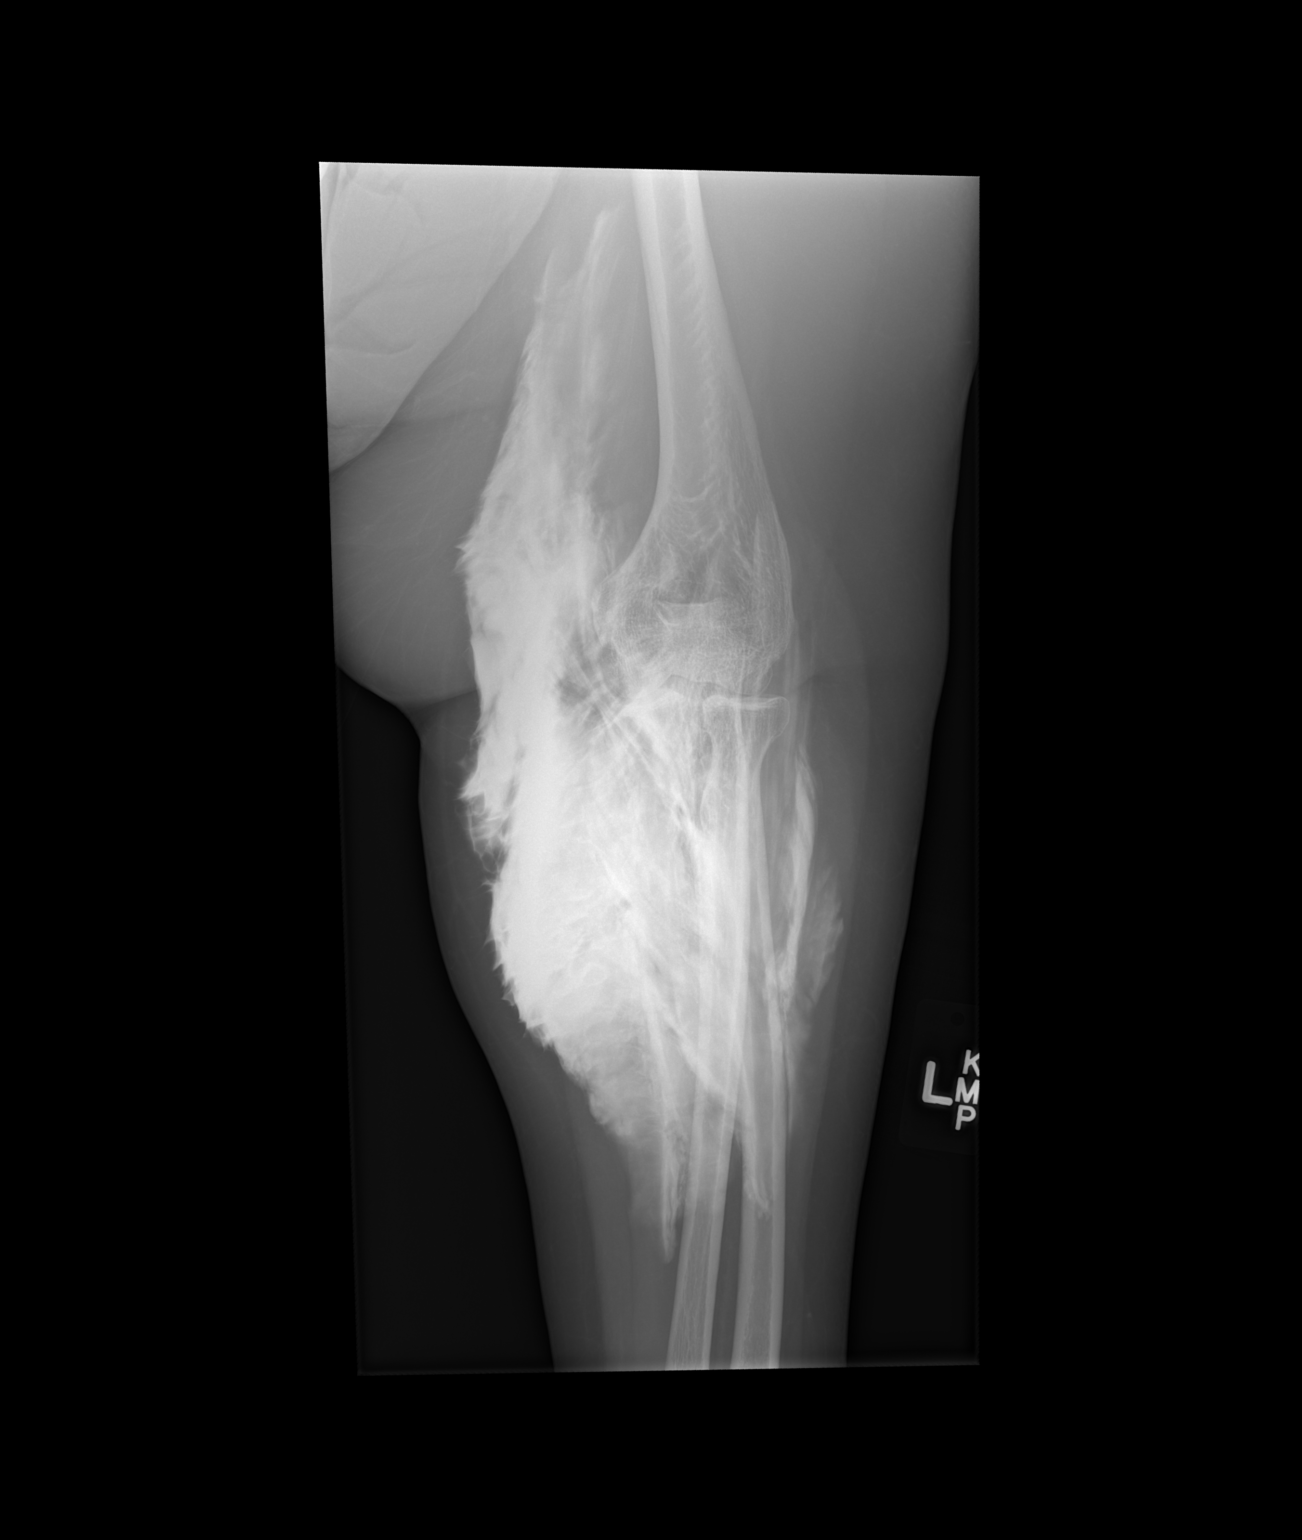

[x humerus ap left (2 of 2)]
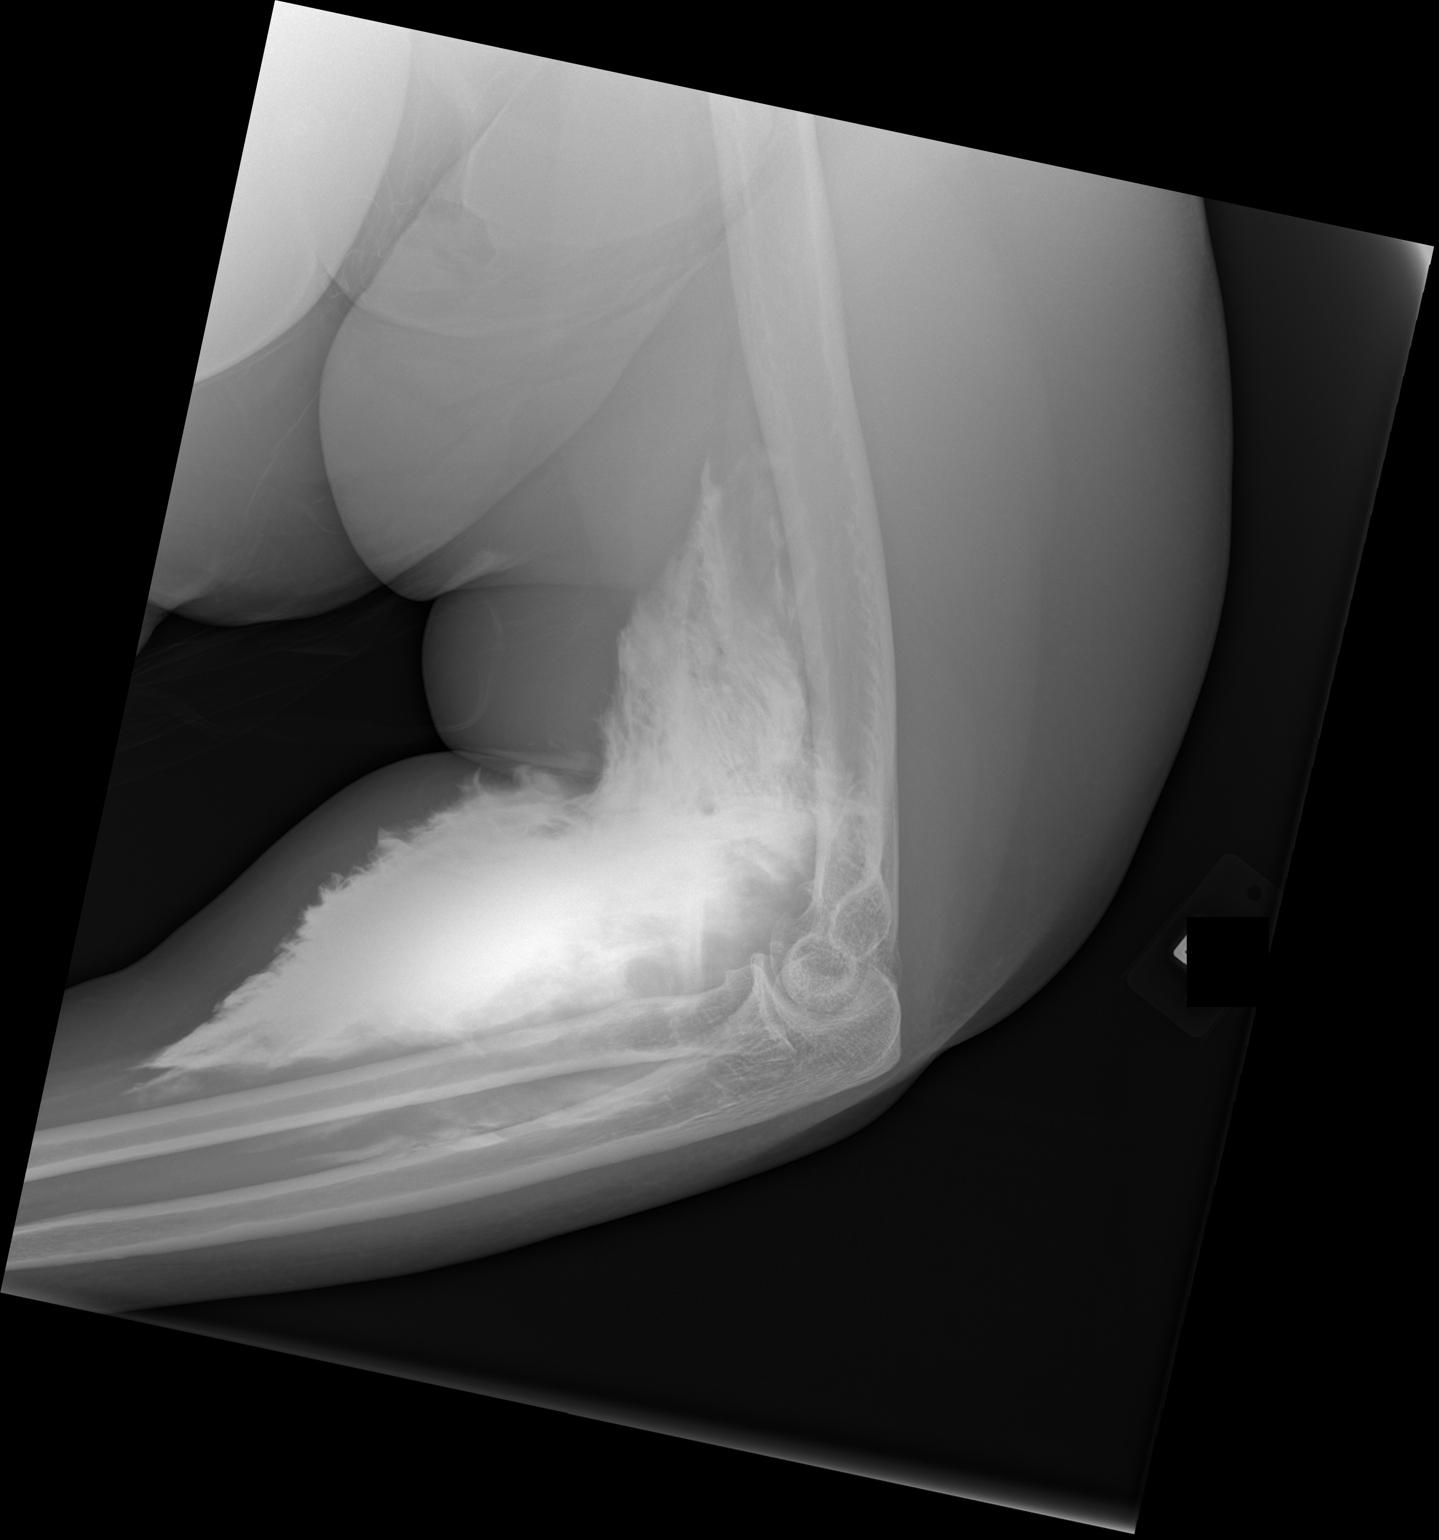

[2 of 2 positions shown; findings below may reference images not displayed]

FINDINGS: Extensive contrast is noted in the antecubital fossae and extending
into the proximal forearm and distal biceps area.
IMPRESSION: Large volume extravasation in the region of the antecubital fossa.

## 2014-05-22 SURGERY — APPENDECTOMY, LAPAROSCOPIC
Anesthesia: General | Site: Abdomen

## 2014-05-22 MED ORDER — FLUOXETINE HCL 20 MG PO CAPS
20.0000 mg | ORAL_CAPSULE | Freq: Every day | ORAL | Status: DC
  Administered 2014-05-23: 20 mg via ORAL
  Filled 2014-05-22: qty 1

## 2014-05-22 MED ORDER — FENTANYL CITRATE 0.05 MG/ML IJ SOLN
INTRAMUSCULAR | Status: AC
  Filled 2014-05-22: qty 5

## 2014-05-22 MED ORDER — IPRATROPIUM-ALBUTEROL 18-103 MCG/ACT IN AERO: 2.0000 | INHALATION_SPRAY | Freq: Four times a day (QID) | RESPIRATORY_TRACT | Status: DC | PRN

## 2014-05-22 MED ORDER — AMITRIPTYLINE HCL 50 MG PO TABS
50.0000 mg | ORAL_TABLET | Freq: Every day | ORAL | Status: DC
  Administered 2014-05-22: 50 mg via ORAL
  Filled 2014-05-22 (×2): qty 1

## 2014-05-22 MED ORDER — SODIUM CHLORIDE 0.9 % IV BOLUS (SEPSIS)
1000.0000 mL | Freq: Once | INTRAVENOUS | Status: AC
  Administered 2014-05-22: 1000 mL via INTRAVENOUS

## 2014-05-22 MED ORDER — 0.9 % SODIUM CHLORIDE (POUR BTL) OPTIME
TOPICAL | Status: DC | PRN
  Administered 2014-05-22: 1000 mL

## 2014-05-22 MED ORDER — LACTATED RINGERS IV SOLN
INTRAVENOUS | Status: DC
Start: 2014-05-22 — End: 2014-05-22

## 2014-05-22 MED ORDER — HYDROCHLOROTHIAZIDE 25 MG PO TABS
25.0000 mg | ORAL_TABLET | Freq: Every day | ORAL | Status: DC
  Administered 2014-05-23: 25 mg via ORAL
  Filled 2014-05-22: qty 1

## 2014-05-22 MED ORDER — OXYCODONE-ACETAMINOPHEN 5-325 MG PO TABS
1.0000 | ORAL_TABLET | ORAL | Status: DC | PRN
  Administered 2014-05-23: 1 via ORAL
  Administered 2014-05-23: 2 via ORAL
  Filled 2014-05-22: qty 1
  Filled 2014-05-22: qty 2

## 2014-05-22 MED ORDER — SERTRALINE HCL 100 MG PO TABS
100.0000 mg | ORAL_TABLET | Freq: Every day | ORAL | Status: DC
  Administered 2014-05-23: 100 mg via ORAL
  Filled 2014-05-22: qty 1

## 2014-05-22 MED ORDER — MORPHINE SULFATE 2 MG/ML IJ SOLN: 2.0000 mg | INTRAMUSCULAR | Status: DC | PRN

## 2014-05-22 MED ORDER — NEOSTIGMINE METHYLSULFATE 10 MG/10ML IV SOLN
INTRAVENOUS | Status: DC | PRN
  Administered 2014-05-22: 4 mg via INTRAVENOUS

## 2014-05-22 MED ORDER — DEXAMETHASONE SODIUM PHOSPHATE 10 MG/ML IJ SOLN
INTRAMUSCULAR | Status: DC | PRN
  Administered 2014-05-22: 10 mg via INTRAVENOUS

## 2014-05-22 MED ORDER — BUPIVACAINE-EPINEPHRINE (PF) 0.25% -1:200000 IJ SOLN
INTRAMUSCULAR | Status: DC | PRN
  Administered 2014-05-22: 18 mL

## 2014-05-22 MED ORDER — MIDAZOLAM HCL 5 MG/5ML IJ SOLN
INTRAMUSCULAR | Status: DC | PRN
  Administered 2014-05-22: 2 mg via INTRAVENOUS

## 2014-05-22 MED ORDER — HEPARIN SODIUM (PORCINE) 5000 UNIT/ML IJ SOLN
5000.0000 [IU] | Freq: Three times a day (TID) | INTRAMUSCULAR | Status: DC
  Administered 2014-05-22 – 2014-05-23 (×2): 5000 [IU] via SUBCUTANEOUS
  Filled 2014-05-22 (×6): qty 1

## 2014-05-22 MED ORDER — METHYLPHENIDATE HCL 10 MG PO TABS: 30.0000 mg | ORAL_TABLET | Freq: Every day | ORAL | Status: DC

## 2014-05-22 MED ORDER — METHYLPHENIDATE HCL 5 MG PO TABS: 20.0000 mg | ORAL_TABLET | Freq: Two times a day (BID) | ORAL | Status: DC

## 2014-05-22 MED ORDER — MORPHINE SULFATE 4 MG/ML IJ SOLN
8.0000 mg | Freq: Once | INTRAMUSCULAR | Status: AC
  Administered 2014-05-22: 8 mg via INTRAVENOUS
  Filled 2014-05-22: qty 2

## 2014-05-22 MED ORDER — PROPOFOL 10 MG/ML IV BOLUS
INTRAVENOUS | Status: DC | PRN
  Administered 2014-05-22: 200 mg via INTRAVENOUS

## 2014-05-22 MED ORDER — QUETIAPINE FUMARATE ER 400 MG PO TB24
400.0000 mg | ORAL_TABLET | Freq: Every day | ORAL | Status: DC
  Administered 2014-05-22: 400 mg via ORAL
  Filled 2014-05-22 (×2): qty 1

## 2014-05-22 MED ORDER — ROCURONIUM BROMIDE 100 MG/10ML IV SOLN
INTRAVENOUS | Status: DC | PRN
  Administered 2014-05-22: 30 mg via INTRAVENOUS

## 2014-05-22 MED ORDER — PANTOPRAZOLE SODIUM 20 MG PO TBEC
20.0000 mg | DELAYED_RELEASE_TABLET | Freq: Every morning | ORAL | Status: DC
  Administered 2014-05-23: 20 mg via ORAL
  Filled 2014-05-22: qty 1

## 2014-05-22 MED ORDER — ONDANSETRON HCL 4 MG/2ML IJ SOLN
4.0000 mg | Freq: Once | INTRAMUSCULAR | Status: AC
  Administered 2014-05-22: 4 mg via INTRAVENOUS
  Filled 2014-05-22: qty 2

## 2014-05-22 MED ORDER — LACTATED RINGERS IR SOLN
Status: DC | PRN
  Administered 2014-05-22: 1000 mL

## 2014-05-22 MED ORDER — ROCURONIUM BROMIDE 100 MG/10ML IV SOLN
INTRAVENOUS | Status: AC
  Filled 2014-05-22: qty 1

## 2014-05-22 MED ORDER — FENTANYL CITRATE 0.05 MG/ML IJ SOLN
INTRAMUSCULAR | Status: DC | PRN
  Administered 2014-05-22: 50 ug via INTRAVENOUS
  Administered 2014-05-22: 100 ug via INTRAVENOUS
  Administered 2014-05-22 (×2): 50 ug via INTRAVENOUS

## 2014-05-22 MED ORDER — ONDANSETRON HCL 4 MG/2ML IJ SOLN
INTRAMUSCULAR | Status: AC
  Filled 2014-05-22: qty 2

## 2014-05-22 MED ORDER — LISINOPRIL 20 MG PO TABS
20.0000 mg | ORAL_TABLET | Freq: Every day | ORAL | Status: DC
  Administered 2014-05-23: 20 mg via ORAL
  Filled 2014-05-22: qty 1

## 2014-05-22 MED ORDER — SUCCINYLCHOLINE CHLORIDE 20 MG/ML IJ SOLN
INTRAMUSCULAR | Status: DC | PRN
  Administered 2014-05-22: 180 mg via INTRAVENOUS

## 2014-05-22 MED ORDER — IPRATROPIUM-ALBUTEROL 0.5-2.5 (3) MG/3ML IN SOLN: 3.0000 mL | Freq: Four times a day (QID) | RESPIRATORY_TRACT | Status: DC | PRN

## 2014-05-22 MED ORDER — IOHEXOL 300 MG/ML  SOLN
50.0000 mL | Freq: Once | INTRAMUSCULAR | Status: AC | PRN
  Administered 2014-05-22: 50 mL via ORAL

## 2014-05-22 MED ORDER — ONDANSETRON HCL 4 MG/2ML IJ SOLN
INTRAMUSCULAR | Status: DC | PRN
  Administered 2014-05-22: 4 mg via INTRAVENOUS

## 2014-05-22 MED ORDER — GLYCOPYRROLATE 0.2 MG/ML IJ SOLN
INTRAMUSCULAR | Status: AC
  Filled 2014-05-22: qty 3

## 2014-05-22 MED ORDER — LISINOPRIL-HYDROCHLOROTHIAZIDE 20-25 MG PO TABS: 1.0000 | ORAL_TABLET | Freq: Every day | ORAL | Status: DC

## 2014-05-22 MED ORDER — TAMSULOSIN HCL 0.4 MG PO CAPS
0.4000 mg | ORAL_CAPSULE | Freq: Every day | ORAL | Status: DC
  Filled 2014-05-22: qty 1

## 2014-05-22 MED ORDER — LIDOCAINE HCL (CARDIAC) 20 MG/ML IV SOLN
INTRAVENOUS | Status: AC
  Filled 2014-05-22: qty 5

## 2014-05-22 MED ORDER — NEOSTIGMINE METHYLSULFATE 10 MG/10ML IV SOLN
INTRAVENOUS | Status: AC
  Filled 2014-05-22: qty 1

## 2014-05-22 MED ORDER — MIDAZOLAM HCL 2 MG/2ML IJ SOLN
INTRAMUSCULAR | Status: AC
  Filled 2014-05-22: qty 2

## 2014-05-22 MED ORDER — DEXTROSE 5 % IV SOLN
2.0000 g | Freq: Once | INTRAVENOUS | Status: AC
  Administered 2014-05-22: 2 g via INTRAVENOUS
  Filled 2014-05-22: qty 2

## 2014-05-22 MED ORDER — ONDANSETRON HCL 4 MG PO TABS: 4.0000 mg | ORAL_TABLET | Freq: Four times a day (QID) | ORAL | Status: DC | PRN

## 2014-05-22 MED ORDER — GLYCOPYRROLATE 0.2 MG/ML IJ SOLN
INTRAMUSCULAR | Status: DC | PRN
  Administered 2014-05-22: 0.6 mg via INTRAVENOUS

## 2014-05-22 MED ORDER — BUPIVACAINE-EPINEPHRINE (PF) 0.25% -1:200000 IJ SOLN
INTRAMUSCULAR | Status: AC
  Filled 2014-05-22: qty 30

## 2014-05-22 MED ORDER — DEXTROSE 5 % IV SOLN
INTRAVENOUS | Status: AC
  Filled 2014-05-22: qty 2

## 2014-05-22 MED ORDER — HYDROMORPHONE HCL 1 MG/ML IJ SOLN: 0.2500 mg | INTRAMUSCULAR | Status: DC | PRN

## 2014-05-22 MED ORDER — LIDOCAINE HCL (CARDIAC) 20 MG/ML IV SOLN
INTRAVENOUS | Status: DC | PRN
  Administered 2014-05-22: 50 mg via INTRAVENOUS

## 2014-05-22 MED ORDER — ALPRAZOLAM 1 MG PO TABS: 1.0000 mg | ORAL_TABLET | Freq: Every evening | ORAL | Status: DC | PRN

## 2014-05-22 MED ORDER — KCL IN DEXTROSE-NACL 20-5-0.45 MEQ/L-%-% IV SOLN
INTRAVENOUS | Status: DC
  Administered 2014-05-22: 23:00:00 via INTRAVENOUS
  Filled 2014-05-22 (×3): qty 1000

## 2014-05-22 MED ORDER — DEXAMETHASONE SODIUM PHOSPHATE 10 MG/ML IJ SOLN
INTRAMUSCULAR | Status: AC
  Filled 2014-05-22: qty 1

## 2014-05-22 MED ORDER — PROPOFOL 10 MG/ML IV BOLUS
INTRAVENOUS | Status: AC
  Filled 2014-05-22: qty 20

## 2014-05-22 MED ORDER — LACTATED RINGERS IV SOLN: INTRAVENOUS | Status: DC

## 2014-05-22 MED ORDER — IOHEXOL 300 MG/ML  SOLN
100.0000 mL | Freq: Once | INTRAMUSCULAR | Status: AC | PRN
  Administered 2014-05-22: 100 mL via INTRAVENOUS

## 2014-05-22 MED ORDER — AMLODIPINE BESYLATE 10 MG PO TABS
10.0000 mg | ORAL_TABLET | Freq: Every morning | ORAL | Status: DC
  Administered 2014-05-23: 10 mg via ORAL
  Filled 2014-05-22: qty 1

## 2014-05-22 MED ORDER — LACTATED RINGERS IV SOLN
INTRAVENOUS | Status: DC | PRN
  Administered 2014-05-22 (×2): via INTRAVENOUS

## 2014-05-22 SURGICAL SUPPLY — 37 items
APPLIER CLIP 5 13 M/L LIGAMAX5 (MISCELLANEOUS)
APPLIER CLIP ROT 10 11.4 M/L (STAPLE) ×3
CHLORAPREP W/TINT 26ML (MISCELLANEOUS) ×3 IMPLANT
CLIP APPLIE 5 13 M/L LIGAMAX5 (MISCELLANEOUS) IMPLANT
CLIP APPLIE ROT 10 11.4 M/L (STAPLE) ×1 IMPLANT
CLOSURE WOUND 1/2 X4 (GAUZE/BANDAGES/DRESSINGS) ×1
CUTTER FLEX LINEAR 45M (STAPLE) ×3 IMPLANT
DECANTER SPIKE VIAL GLASS SM (MISCELLANEOUS) ×3 IMPLANT
DRAPE LAPAROSCOPIC ABDOMINAL (DRAPES) ×3 IMPLANT
ELECT REM PT RETURN 9FT ADLT (ELECTROSURGICAL) ×3
ELECTRODE REM PT RTRN 9FT ADLT (ELECTROSURGICAL) ×1 IMPLANT
GLOVE BIOGEL PI IND STRL 7.0 (GLOVE) ×1 IMPLANT
GLOVE BIOGEL PI INDICATOR 7.0 (GLOVE) ×2
GLOVE SS BIOGEL STRL SZ 7.5 (GLOVE) ×1 IMPLANT
GLOVE SUPERSENSE BIOGEL SZ 7.5 (GLOVE) ×2
GOWN STRL REUS W/TWL LRG LVL3 (GOWN DISPOSABLE) ×3 IMPLANT
GOWN STRL REUS W/TWL XL LVL3 (GOWN DISPOSABLE) ×6 IMPLANT
IV LACTATED RINGERS 1000ML (IV SOLUTION) ×3 IMPLANT
KIT BASIN OR (CUSTOM PROCEDURE TRAY) ×3 IMPLANT
LIQUID BAND (GAUZE/BANDAGES/DRESSINGS) IMPLANT
NS IRRIG 1000ML POUR BTL (IV SOLUTION) ×3 IMPLANT
PENCIL BUTTON HOLSTER BLD 10FT (ELECTRODE) ×3 IMPLANT
POUCH SPECIMEN RETRIEVAL 10MM (ENDOMECHANICALS) ×3 IMPLANT
RELOAD 45 VASCULAR/THIN (ENDOMECHANICALS) IMPLANT
RELOAD STAPLE TA45 3.5 REG BLU (ENDOMECHANICALS) ×3 IMPLANT
SET IRRIG TUBING LAPAROSCOPIC (IRRIGATION / IRRIGATOR) ×3 IMPLANT
SHEARS HARMONIC ACE PLUS 36CM (ENDOMECHANICALS) ×3 IMPLANT
SOLUTION ANTI FOG 6CC (MISCELLANEOUS) ×3 IMPLANT
STRIP CLOSURE SKIN 1/2X4 (GAUZE/BANDAGES/DRESSINGS) ×2 IMPLANT
SUT MNCRL AB 4-0 PS2 18 (SUTURE) ×3 IMPLANT
TOWEL OR 17X26 10 PK STRL BLUE (TOWEL DISPOSABLE) ×3 IMPLANT
TRAY FOLEY CATH 14FRSI W/METER (CATHETERS) ×3 IMPLANT
TRAY LAPAROSCOPIC (CUSTOM PROCEDURE TRAY) ×3 IMPLANT
TROCAR BLADELESS OPT 5 75 (ENDOMECHANICALS) ×3 IMPLANT
TROCAR XCEL 12X100 BLDLESS (ENDOMECHANICALS) ×3 IMPLANT
TROCAR XCEL BLUNT TIP 100MML (ENDOMECHANICALS) ×3 IMPLANT
TUBING INSUFFLATION 10FT LAP (TUBING) ×3 IMPLANT

## 2014-05-22 NOTE — ED Notes (Signed)
Per EMS- patient c/o bilateral lower abdominal pain L>R and nausea. Patient denies vomiting or diarrhea. Patient states her last BM was 2 days ago.

## 2014-05-22 NOTE — Progress Notes (Signed)
EXISTING IV INFILTRATRED--DR'S GOLDSTON & MCCULLOUGH EVALUATED PT'S ARM--X-RAY PERFORMED---PT HAS NO PAIN, ARM ELEVATED, ICE APPLIED  RN Michel SanteeKim Engle notified, iv removed, pt given take home instructions, Radiology PA notified for follow up

## 2014-05-22 NOTE — ED Notes (Signed)
IV team placed 20g to R AC. IV infiltrated after approximate 100 ml of NS. IV team unable to obtain IV access and will wait for surgery / anesthesia team to place IV. Pt made aware.

## 2014-05-22 NOTE — Transfer of Care (Signed)
Immediate Anesthesia Transfer of Care Note  Patient: Monique NorrieLisa Holland  Procedure(s) Performed: Procedure(s): APPENDECTOMY LAPAROSCOPIC (N/A)  Patient Location: PACU  Anesthesia Type:General  Level of Consciousness: awake, alert  and oriented  Airway & Oxygen Therapy: Patient Spontanous Breathing and Patient connected to face mask oxygen  Post-op Assessment: Report given to PACU RN and Post -op Vital signs reviewed and stable  Post vital signs: Reviewed and stable  Complications: No apparent anesthesia complications

## 2014-05-22 NOTE — Anesthesia Preprocedure Evaluation (Addendum)
Anesthesia Evaluation  Patient identified by MRN, date of birth, ID band Patient awake    Reviewed: Allergy & Precautions, H&P , NPO status , Patient's Chart, lab work & pertinent test results  Airway Mallampati: II  TM Distance: >3 FB Neck ROM: Full    Dental  (+) Caps, Dental Advisory Given Upper front two teeth capped:   Pulmonary asthma , sleep apnea ,  breath sounds clear to auscultation  Pulmonary exam normal       Cardiovascular hypertension, Pt. on medications Rhythm:Regular Rate:Normal     Neuro/Psych Anxiety Depression Schizophrenia negative neurological ROS  negative psych ROS   GI/Hepatic negative GI ROS, Neg liver ROS, GERD-  Medicated and Controlled,  Endo/Other  negative endocrine ROSMorbid obesity  Renal/GU negative Renal ROS  negative genitourinary   Musculoskeletal negative musculoskeletal ROS (+)   Abdominal (+) + obese,   Peds negative pediatric ROS (+)  Hematology negative hematology ROS (+) anemia , hgb 9.8   Anesthesia Other Findings Upper front right cap   Reproductive/Obstetrics negative OB ROS                           Anesthesia Physical Anesthesia Plan  ASA: III  Anesthesia Plan: General   Post-op Pain Management:    Induction: Intravenous, Rapid sequence and Cricoid pressure planned  Airway Management Planned: Oral ETT  Additional Equipment:   Intra-op Plan:   Post-operative Plan: Extubation in OR  Informed Consent: I have reviewed the patients History and Physical, chart, labs and discussed the procedure including the risks, benefits and alternatives for the proposed anesthesia with the patient or authorized representative who has indicated his/her understanding and acceptance.   Dental Advisory Given  Plan Discussed with: CRNA and Surgeon  Anesthesia Plan Comments:         Anesthesia Quick Evaluation

## 2014-05-22 NOTE — Op Note (Signed)
Preoperative Diagnosis: Acute appendicitis  Postoperative diagnosis:Same  Procedure: Procedure(s): APPENDECTOMY LAPAROSCOPIC   Surgeon: Glenna FellowsHoxworth, Desma Wilkowski T   Assistants: None  Anesthesia:  General endotracheal anesthesia  Indications: Patient is a 48 year old female who presents with 24 hours of diffuse lower abdominal pain and nausea. She is most tender in the right lower quadrant and CT scan has shown evidence of acute appendicitis. I recommended proceeding with laparoscopic appendectomy. We discussed the procedure, its indications and nature and risks documented elsewhere. She agrees to proceed.   Procedure Detail: Preoperatively the patient received broad-spectrum IV antibiotics and subcutaneous heparin. She was brought to the operating room, placed in the supine position on the operating table, and general endotracheal anesthesia induced. Foley catheter was placed. The abdomen was widely sterilely prepped and draped. Patient timeout was performed and correct procedure verified. Access was obtained with a 5 mm Optiview trocar in the right upper quadrant without difficulty and pneumoperitoneum established. There was no evidence of trocar injury. Under direct vision a 5 mm trocar was placed in the midline above the umbilicus due to morbid obesity and a 12 mm trocar placed in the left lower quadrant. The appendix was localized lying just inferior to the cecum. It was elevated using careful blunt dissection and was acutely inflamed with exudate but no evidence of gangrene or perforation. Some lateral peritoneal attachments were divided with the Harmonic scalpel mobilizing the appendix. The mesoappendix was then sequentially divided with the Harmonic scalpel until the appendix was completely freed down to the tip of the cecum which was relatively free of inflammation. The appendix was then divided across the tip of the cecum with a single firing of the Endo GIA 45 mm blue load stapler. The staple  line was intact without bleeding. The right lower quadrant was thoroughly irrigated and hemostasis assured. The appendix was placed in an Endo Catch bag and brought out through the 12 mm trocar site. The abdomen was inspected and then trochars removed and all CO2 evacuated. Skin incisions were closed with subcuticular Monocryl and Dermabond. Sponge needle and instrument counts were correct.    Findings: Acute appendicitis without perforation or gangrene  Estimated Blood Loss:  Minimal         Drains: none  Blood Given: none          Specimens: Appendix        Complications:  * No complications entered in OR log *         Disposition: PACU - hemodynamically stable.         Condition: stable

## 2014-05-22 NOTE — Progress Notes (Signed)
Patient declines the use of nocturnal CPAP. She admits to CPAP non-compliance at home and does not remember the time of her last sleep study. RN aware. RT will continue to follow.

## 2014-05-22 NOTE — ED Notes (Signed)
Bed: WA07 Expected date:  Expected time:  Means of arrival:  Comments: ems 

## 2014-05-22 NOTE — ED Provider Notes (Signed)
CSN: [id number]     Arrival date & time 05/22/14  1049 History   First MD Initiated Contact with Patient 05/22/14 1055     Chief Complaint  Patient presents with  . Abdominal Pain     (Consider location/radiation/quality/duration/timing/severity/associated sxs/prior Treatment) HPI  This 48 year old female presents with lower abdominal pain that started yesterday. The pain is left, right, and suprapubic. The pain seems to move back and forth. No back pain. His had nausea without vomiting. No fevers or chills. No diarrhea. Has never had pain like this before. Has a prior history of past kidney stones but states this pain feels different. Denies vaginal bleeding, discharge, or urinary symptoms. The pain is rated as severe. Feels more comfortable laying on her side  Past Medical History  Diagnosis Date  . Hypertension   . Schizophrenia   . Ureteral calculi     BILATERAL  . OSA (obstructive sleep apnea)     pt had study done oct 2014--  schedule for cpap titrate after kidney stone surgery  . Frequency of urination   . GERD (gastroesophageal reflux disease)   . Wears glasses   . History of asthma     last episode yrs ago  . Depression   . Anxiety   . Asthma    Past Surgical History  Procedure Laterality Date  . Cesarean section  1991  &  2002    w/ bilateral tubal ligation in 2002  . Tonsillectomy  age 16  . Cystoscopy with retrograde pyelogram, ureteroscopy and stent placement Bilateral 05/02/2013    Procedure: CYSTOSCOPY WITH RETROGRADE PYELOGRAM, URETEROSCOPY ;  Surgeon: Valetta Fuller, MD;  Location: [facility];  Service: Urology;  Laterality: Bilateral;  . Holmium laser application Bilateral 05/02/2013    Procedure: HOLMIUM LASER APPLICATION;  Surgeon: Valetta Fuller, MD;  Location: [facility];  Service: Urology;  Laterality: Bilateral;  . Cystoscopy with stent placement Left 05/02/2013    Procedure: CYSTOSCOPY WITH STENT PLACEMENT;  Surgeon:  Valetta Fuller, MD;  Location: [facility];  Service: Urology;  Laterality: Left;   History reviewed. No pertinent family history. History  Substance Use Topics  . Smoking status: Never Smoker   . Smokeless tobacco: Never Used  . Alcohol Use: No   OB History    No data available     Review of Systems  Constitutional: Negative for fever.  Gastrointestinal: Positive for nausea and abdominal pain. Negative for vomiting, diarrhea and constipation.  Genitourinary: Negative for dysuria, vaginal bleeding and vaginal discharge.  Musculoskeletal: Negative for back pain.  All other systems reviewed and are negative.     Allergies  Review of patient's allergies indicates no known allergies.  Home Medications   Prior to Admission medications   Medication Sig Start Date End Date Taking? Authorizing Provider  albuterol-ipratropium (COMBIVENT) 18-103 MCG/ACT inhaler Inhale 2 puffs into the lungs every 6 (six) hours as needed for wheezing.    Historical Provider, MD  ALPRAZolam Prudy Feeler) 1 MG tablet Take 1 mg by mouth at bedtime as needed for sleep.    Historical Provider, MD  amitriptyline (ELAVIL) 50 MG tablet Take 50 mg by mouth at bedtime.    Historical Provider, MD  amLODipine (NORVASC) 10 MG tablet Take 10 mg by mouth every morning.     Historical Provider, MD  clonazePAM (KLONOPIN) 1 MG tablet Take 1 mg by mouth 2 (two) times daily as needed for anxiety.    Historical Provider, MD  FLUoxetine (PROZAC) 20 MG capsule Take 20 mg by mouth daily.    Historical Provider, MD  HYDROcodone-acetaminophen (NORCO/VICODIN) 5-325 MG per tablet 1-2 tablets po q 6 hours prn moderate to severe pain 04/24/13   Gavin PoundMichael Y. Ghim, MD  ibuprofen (ADVIL,MOTRIN) 200 MG tablet Take 800 mg by mouth every 8 (eight) hours as needed.    Historical Provider, MD  lisinopril-hydrochlorothiazide (PRINZIDE,ZESTORETIC) 20-25 MG per tablet Take 1 tablet by mouth daily.    Historical Provider, MD  Meth-Hyo-M  Bl-Na Phos-Ph Sal (URIBEL) 118 MG CAPS Take 1 capsule by mouth 3 (three) times daily as needed (bladder spasms).    Historical Provider, MD  methylphenidate (RITALIN) 20 MG tablet Take 20 mg by mouth 2 (two) times daily.    Historical Provider, MD  ondansetron (ZOFRAN) 4 MG tablet Take 1 tablet (4 mg total) by mouth every 6 (six) hours. 03/17/14   Mora BellmanHannah S Merrell, PA-C  oxyCODONE-acetaminophen (PERCOCET/ROXICET) 5-325 MG per tablet Take 1-2 tablets by mouth every 6 (six) hours as needed for severe pain. 03/17/14   Mora BellmanHannah S Merrell, PA-C  pantoprazole (PROTONIX) 20 MG tablet Take 20 mg by mouth every morning.     Historical Provider, MD  QUEtiapine (SEROQUEL XR) 400 MG 24 hr tablet Take 400 mg by mouth at bedtime.     Historical Provider, MD  sertraline (ZOLOFT) 100 MG tablet Take 100 mg by mouth daily.    Historical Provider, MD  tamsulosin (FLOMAX) 0.4 MG CAPS capsule Take 1 capsule (0.4 mg total) by mouth daily after supper. 04/24/13   Gavin PoundMichael Y. Ghim, MD  tamsulosin (FLOMAX) 0.4 MG CAPS capsule Take 1 capsule (0.4 mg total) by mouth 2 (two) times daily. 03/17/14   Mora BellmanHannah S Merrell, PA-C   LMP 03/23/2014 Physical Exam  Constitutional: She is oriented to person, place, and time. She appears well-developed and well-nourished.  Morbidly obese  HENT:  Head: Normocephalic and atraumatic.  Right Ear: External ear normal.  Left Ear: External ear normal.  Nose: Nose normal.  Eyes: Right eye exhibits no discharge. Left eye exhibits no discharge.  Cardiovascular: Normal rate, regular rhythm and normal heart sounds.   Pulmonary/Chest: Effort normal and breath sounds normal.  Abdominal: Soft. There is tenderness.    Neurological: She is alert and oriented to person, place, and time.  Skin: Skin is warm and dry. She is not diaphoretic.  Nursing note and vitals reviewed.   ED Course  Procedures (including critical care time) Labs Review Labs Reviewed  COMPREHENSIVE METABOLIC PANEL - Abnormal;  Notable for the following:    Sodium 134 (*)    Glucose, Bld 124 (*)    Calcium 11.3 (*)    AST 52 (*)    ALT 52 (*)    GFR calc non Af Amer 84 (*)    All other components within normal limits  CBC WITH DIFFERENTIAL - Abnormal; Notable for the following:    Hemoglobin 9.8 (*)    HCT 32.7 (*)    MCV 76.4 (*)    MCH 22.9 (*)    RDW 15.7 (*)    All other components within normal limits  LIPASE, BLOOD  URINALYSIS, ROUTINE W REFLEX MICROSCOPIC  POC URINE PREG, ED    Imaging Review Ct Abdomen Pelvis Wo Contrast  05/22/2014   CLINICAL DATA:  Bilateral lower abdominal pain, left greater than right, with nausea.  EXAM: CT ABDOMEN AND PELVIS WITHOUT CONTRAST  TECHNIQUE: Multidetector CT imaging of the abdomen and pelvis was performed following  the standard protocol without IV contrast.  COMPARISON:  03/17/2014.  FINDINGS: Lower chest: Lung bases show no acute findings. Heart size normal. No pericardial or pleural effusion.  Hepatobiliary: Liver and gallbladder are unremarkable. No biliary ductal dilatation.  Pancreas: Negative.  Spleen: Negative.  Adrenals/Urinary Tract: Adrenal glands are unremarkable. Small stones are seen in the kidneys bilaterally which are otherwise unremarkable. Ureters are decompressed. Bladder is unremarkable.  Stomach/Bowel: Stomach and small bowel are unremarkable. Appendix is fluid-filled and dilated, measuring 1.2 cm. There is surrounding inflammatory stranding and fluid. No extraluminal air or organized fluid collection. Colon is unremarkable.  Vascular/Lymphatic: Vascular structures are unremarkable. No pathologically enlarged lymph nodes.  Reproductive: Uterus and ovaries are visualized.  Other: No free fluid.  Mesenteries and peritoneum are unremarkable.  Musculoskeletal: No worrisome lytic or sclerotic lesions. 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  IMPRESSION: 1. Dilated and fluid-filled appendix, with  surrounding inflammatory changes, most consistent with acute appendicitis. No complicating features. 2. Bilateral renal stones without obstruction.   Electronically Signed   By: Leanna BattlesMelinda  Blietz M.D.   On: 05/22/2014 15:43   Dg Elbow 2 Views Left  05/22/2014   CLINICAL DATA:  Contrast extravasation during CT scan.  EXAM: LEFT ELBOW - 2 VIEW  COMPARISON:  None.  FINDINGS: Extensive contrast is noted in the antecubital fossae and extending into the proximal forearm and distal biceps area.  IMPRESSION: Large volume extravasation in the region of the antecubital fossa.   Electronically Signed   By: Loralie ChampagneMark  Gallerani M.D.   On: 05/22/2014 15:27     EKG Interpretation None      Angiocath insertion Performed by: Pricilla LovelessGOLDSTON, Haniel Fix T  Consent: Verbal consent obtained. Risks and benefits: risks, benefits and alternatives were discussed Time out: Immediately prior to procedure a "time out" was called to verify the correct patient, procedure, equipment, support staff and site/side marked as required.  Preparation: Patient was prepped and draped in the usual sterile fashion.  Vein Location: left basilic  Ultrasound Guided  Gauge: 20  Normal blood return and flush without difficulty Patient tolerance: Patient tolerated the procedure well with no immediate complications.    MDM   Final diagnoses:  Extravasation accident  Lower abdominal pain    Patient CT scan is concerning for acute appendicitis. We'll consult general surgery. Of note, patient's IV infiltrated in her left antecubital fossa. She has a large amount of extravasation, no pain but does have focal swelling. She has normal pulses distally, normal sensation, and soft compartments. Ice applied, extremity elevated. D/w Dr. Melvyn Novasrtmann who recommends ice, elevation, and monitoring of symptoms and observation in ER (will need admission due to appendicitis).    Audree CamelScott T Danny Yackley, MD 05/22/14 [phone]

## 2014-05-22 NOTE — H&P (Signed)
Monique Holland is an 48 y.o. female.   Chief Complaint: Lower abdominal pain  HPI: Patient is a 48 year old female who approximately 24 hours ago had the gradual onset of pain in her lower abdomen. She describes aching and sharp pain Across her lower abdomen which seems to start around the middle and radiate to both sides but somewhat more on the right than the left. The pain has been constant and gradually worsening and she presented to the emergency department earlier today. She has no history of any similar previous problems. She does have a history of kidney stones but states this feels very different. She has been nauseated and almost vomiting but no vomiting. No fever or chills. No urinary symptoms. No vaginal discharge. The pain is worse with motion.  Past Medical History  Diagnosis Date  . Hypertension   . Schizophrenia   . Ureteral calculi     BILATERAL  . OSA (obstructive sleep apnea)     pt had study done oct 2014--  schedule for cpap titrate after kidney stone surgery  . Frequency of urination   . GERD (gastroesophageal reflux disease)   . Wears glasses   . History of asthma     last episode yrs ago  . Depression   . Anxiety   . Asthma     Past Surgical History  Procedure Laterality Date  . Cesarean section  1991  &  2002    w/ bilateral tubal ligation in 2002  . Tonsillectomy  age 60  . Cystoscopy with retrograde pyelogram, ureteroscopy and stent placement Bilateral 05/02/2013    Procedure: CYSTOSCOPY WITH RETROGRADE PYELOGRAM, URETEROSCOPY ;  Surgeon: Bernestine Amass, MD;  Location: [facility];  Service: Urology;  Laterality: Bilateral;  . Holmium laser application Bilateral 95/62/1308    Procedure: HOLMIUM LASER APPLICATION;  Surgeon: Bernestine Amass, MD;  Location: [facility];  Service: Urology;  Laterality: Bilateral;  . Cystoscopy with stent placement Left 05/02/2013    Procedure: CYSTOSCOPY WITH STENT PLACEMENT;  Surgeon: Bernestine Amass,  MD;  Location: [facility];  Service: Urology;  Laterality: Left;    History reviewed. No pertinent family history. Social History:  reports that she has never smoked. She has never used smokeless tobacco. She reports that she does not drink alcohol or use illicit drugs.  Allergies: No Known Allergies   Current Facility-Administered Medications  Medication Dose Route Frequency Provider Last Rate Last Dose  . cefOXitin (MEFOXIN) 2 g in dextrose 5 % 50 mL IVPB  2 g Intravenous Once Excell Seltzer, MD      . heparin injection 5,000 Units  5,000 Units Subcutaneous 3 times per day Excell Seltzer, MD       Current Outpatient Prescriptions  Medication Sig Dispense Refill  . albuterol-ipratropium (COMBIVENT) 18-103 MCG/ACT inhaler Inhale 2 puffs into the lungs every 6 (six) hours as needed for wheezing.    Marland Kitchen ALPRAZolam (XANAX) 1 MG tablet Take 1 mg by mouth at bedtime as needed for sleep.    Marland Kitchen amitriptyline (ELAVIL) 50 MG tablet Take 50 mg by mouth at bedtime.    Marland Kitchen amLODipine (NORVASC) 10 MG tablet Take 10 mg by mouth every morning.     . clonazePAM (KLONOPIN) 1 MG tablet Take 1 mg by mouth 2 (two) times daily as needed for anxiety.    Marland Kitchen FLUoxetine (PROZAC) 20 MG capsule Take 20 mg by mouth daily.    Marland Kitchen HYDROcodone-acetaminophen (NORCO/VICODIN) 5-325 MG per tablet  1-2 tablets po q 6 hours prn moderate to severe pain 20 tablet 0  . ibuprofen (ADVIL,MOTRIN) 200 MG tablet Take 800 mg by mouth every 8 (eight) hours as needed.    Marland Kitchen lisinopril-hydrochlorothiazide (PRINZIDE,ZESTORETIC) 20-25 MG per tablet Take 1 tablet by mouth daily.    . Meth-Hyo-M Bl-Na Phos-Ph Sal (URIBEL) 118 MG CAPS Take 1 capsule by mouth 3 (three) times daily as needed (bladder spasms).    . methylphenidate (RITALIN) 20 MG tablet Take 20 mg by mouth 2 (two) times daily.    . ondansetron (ZOFRAN) 4 MG tablet Take 1 tablet (4 mg total) by mouth every 6 (six) hours. (Patient taking differently: Take 4 mg by mouth  every 6 (six) hours as needed for nausea or vomiting. ) 12 tablet 0  . oxyCODONE-acetaminophen (PERCOCET/ROXICET) 5-325 MG per tablet Take 1-2 tablets by mouth every 6 (six) hours as needed for severe pain. 15 tablet 0  . pantoprazole (PROTONIX) 20 MG tablet Take 20 mg by mouth every morning.     Marland Kitchen QUEtiapine (SEROQUEL XR) 400 MG 24 hr tablet Take 400 mg by mouth at bedtime.     . sertraline (ZOLOFT) 100 MG tablet Take 100 mg by mouth daily.    . tamsulosin (FLOMAX) 0.4 MG CAPS capsule Take 1 capsule (0.4 mg total) by mouth daily after supper. 14 capsule 0     Results for orders placed or performed during the hospital encounter of 05/22/14 (from the past 48 hour(s))  Comprehensive metabolic panel     Status: Abnormal   Collection Time: 05/22/14 12:34 PM  Result Value Ref Range   Sodium 134 (L) 135 - 145 mmol/L    Comment: Please note change in reference range.   Potassium 4.1 3.5 - 5.1 mmol/L    Comment: Please note change in reference range.   Chloride 107 96 - 112 mEq/L   CO2 20 19 - 32 mmol/L   Glucose, Bld 124 (H) 70 - 99 mg/dL   BUN 17 6 - 23 mg/dL   Creatinine, Ser 0.82 0.50 - 1.10 mg/dL   Calcium 11.3 (H) 8.4 - 10.5 mg/dL   Total Protein 7.3 6.0 - 8.3 g/dL   Albumin 4.1 3.5 - 5.2 g/dL   AST 52 (H) 0 - 37 U/L   ALT 52 (H) 0 - 35 U/L   Alkaline Phosphatase 48 39 - 117 U/L   Total Bilirubin 0.6 0.3 - 1.2 mg/dL   GFR calc non Af Amer 84 (L) >90 mL/min   GFR calc Af Amer >90 >90 mL/min    Comment: (NOTE) The eGFR has been calculated using the CKD EPI equation. This calculation has not been validated in all clinical situations. eGFR's persistently <90 mL/min signify possible Chronic Kidney Disease.    Anion gap 7 5 - 15  Lipase, blood     Status: None   Collection Time: 05/22/14 12:34 PM  Result Value Ref Range   Lipase 27 11 - 59 U/L  CBC with Differential     Status: Abnormal   Collection Time: 05/22/14 12:34 PM  Result Value Ref Range   WBC 7.5 4.0 - 10.5 K/uL   RBC  4.28 3.87 - 5.11 MIL/uL   Hemoglobin 9.8 (L) 12.0 - 15.0 g/dL   HCT 32.7 (L) 36.0 - 46.0 %   MCV 76.4 (L) 78.0 - 100.0 fL   MCH 22.9 (L) 26.0 - 34.0 pg   MCHC 30.0 30.0 - 36.0 g/dL   RDW 15.7 (H) 11.5 -  15.5 %   Platelets 265 150 - 400 K/uL   Neutrophils Relative % 74 43 - 77 %   Neutro Abs 5.6 1.7 - 7.7 K/uL   Lymphocytes Relative 14 12 - 46 %   Lymphs Abs 1.0 0.7 - 4.0 K/uL   Monocytes Relative 10 3 - 12 %   Monocytes Absolute 0.7 0.1 - 1.0 K/uL   Eosinophils Relative 2 0 - 5 %   Eosinophils Absolute 0.1 0.0 - 0.7 K/uL   Basophils Relative 0 0 - 1 %   Basophils Absolute 0.0 0.0 - 0.1 K/uL  POC urine preg, ED     Status: None   Collection Time: 05/22/14  2:13 PM  Result Value Ref Range   Preg Test, Ur NEGATIVE NEGATIVE    Comment:        THE SENSITIVITY OF THIS METHODOLOGY IS >24 mIU/mL   Urinalysis, Routine w reflex microscopic     Status: None   Collection Time: 05/22/14  2:24 PM  Result Value Ref Range   Color, Urine YELLOW YELLOW   APPearance CLEAR CLEAR   Specific Gravity, Urine 1.014 1.005 - 1.030   pH 7.0 5.0 - 8.0   Glucose, UA NEGATIVE NEGATIVE mg/dL   Hgb urine dipstick NEGATIVE NEGATIVE   Bilirubin Urine NEGATIVE NEGATIVE   Ketones, ur NEGATIVE NEGATIVE mg/dL   Protein, ur NEGATIVE NEGATIVE mg/dL   Urobilinogen, UA 0.2 0.0 - 1.0 mg/dL   Nitrite NEGATIVE NEGATIVE   Leukocytes, UA NEGATIVE NEGATIVE    Comment: MICROSCOPIC NOT DONE ON URINES WITH NEGATIVE PROTEIN, BLOOD, LEUKOCYTES, NITRITE, OR GLUCOSE <1000 mg/dL.   Ct Abdomen Pelvis Wo Contrast  05/22/2014   CLINICAL DATA:  Bilateral lower abdominal pain, left greater than right, with nausea.  EXAM: CT ABDOMEN AND PELVIS WITHOUT CONTRAST  TECHNIQUE: Multidetector CT imaging of the abdomen and pelvis was performed following the standard protocol without IV contrast.  COMPARISON:  03/17/2014.  FINDINGS: Lower chest: Lung bases show no acute findings. Heart size normal. No pericardial or pleural effusion.   Hepatobiliary: Liver and gallbladder are unremarkable. No biliary ductal dilatation.  Pancreas: Negative.  Spleen: Negative.  Adrenals/Urinary Tract: Adrenal glands are unremarkable. Small stones are seen in the kidneys bilaterally which are otherwise unremarkable. Ureters are decompressed. Bladder is unremarkable.  Stomach/Bowel: Stomach and small bowel are unremarkable. Appendix is fluid-filled and dilated, measuring 1.2 cm. There is surrounding inflammatory stranding and fluid. No extraluminal air or organized fluid collection. Colon is unremarkable.  Vascular/Lymphatic: Vascular structures are unremarkable. No pathologically enlarged lymph nodes.  Reproductive: Uterus and ovaries are visualized.  Other: No free fluid.  Mesenteries and peritoneum are unremarkable.  Musculoskeletal: No worrisome lytic or sclerotic lesions. 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  IMPRESSION: 1. Dilated and fluid-filled appendix, with surrounding inflammatory changes, most consistent with acute appendicitis. No complicating features. 2. Bilateral renal stones without obstruction.   Electronically Signed   By: Lorin Picket M.D.   On: 05/22/2014 15:43   Dg Elbow 2 Views Left  05/22/2014   CLINICAL DATA:  Contrast extravasation during CT scan.  EXAM: LEFT ELBOW - 2 VIEW  COMPARISON:  None.  FINDINGS: Extensive contrast is noted in the antecubital fossae and extending into the proximal forearm and distal biceps area.  IMPRESSION: Large volume extravasation in the region of the antecubital fossa.   Electronically Signed   By: Kalman Jewels M.D.   On: 05/22/2014 15:27    Review  of Systems  Constitutional: Negative for fever and chills.  Respiratory: Negative.   Cardiovascular: Negative.   Gastrointestinal: Positive for nausea and abdominal pain. Negative for vomiting, diarrhea, constipation and blood in stool.  Genitourinary: Negative.   Musculoskeletal:  Positive for joint pain.  Psychiatric/Behavioral: Positive for depression. The patient is nervous/anxious.     Blood pressure 147/67, pulse 90, resp. rate 18, last menstrual period 03/23/2014, SpO2 98 %. Physical Exam   General: Alert, Morbidly obese African-American female, in no distress Skin: Warm and dry without rash or infection. HEENT: No palpable masses or thyromegaly. Sclera nonicteric. Pupils equal round and reactive. Oropharynx clear. Lymph nodes: No cervical, supraclavicular, or inguinal nodes palpable. Lungs: Breath sounds clear and equal without increased work of breathing Cardiovascular: Regular rate and rhythm without murmur. No JVD or edema. Peripheral pulses intact. Abdomen: Nondistended. Moderate diffuse lower abdominal tenderness but greatest on the right side with some guarding. Well-healed low midline incision. No masses palpable. No organomegaly. No palpable hernias. Extremities: No edema or joint swelling or deformity. No chronic venous stasis changes. Neurologic: Alert and fully oriented. No gross motor deficits   Assessment/Plan Apparent acute appendicitis. Her symptoms are not well localized and she does not have elevated white count but I have personally reviewed her imaging which shows a definitely abnormal appendix with dilatation and some inflammatory change. I have recommended proceeding with laparoscopic and possible open appendectomy. I discussed alternatives of antibiotic management and the nature of surgery and expected recovery as well as risks of general anesthesia, cardiorespiratory problems, bleeding, infection, or possible need for open surgery. She understands and agrees to proceed.  Mikalah Skyles T 05/22/2014, 5:37 PM

## 2014-05-22 NOTE — Anesthesia Postprocedure Evaluation (Signed)
  Anesthesia Post-op Note  Patient: Monique Holland  Procedure(s) Performed: Procedure(s) (LRB): APPENDECTOMY LAPAROSCOPIC (N/A)  Patient Location: PACU  Anesthesia Type: General  Level of Consciousness: awake and alert   Airway and Oxygen Therapy: Patient Spontanous Breathing  Post-op Pain: mild  Post-op Assessment: Post-op Vital signs reviewed, Patient's Cardiovascular Status Stable, Respiratory Function Stable, Patent Airway and No signs of Nausea or vomiting  Last Vitals:  Filed Vitals:   05/22/14 2001  BP:   Pulse:   Temp: 37.2 C  Resp:     Post-op Vital Signs: stable   Complications: No apparent anesthesia complications

## 2014-05-23 LAB — CBC
HCT: 31.3 % — ABNORMAL LOW (ref 36.0–46.0)
HEMOGLOBIN: 9.4 g/dL — AB (ref 12.0–15.0)
MCH: 22.7 pg — AB (ref 26.0–34.0)
MCHC: 30 g/dL (ref 30.0–36.0)
MCV: 75.6 fL — ABNORMAL LOW (ref 78.0–100.0)
PLATELETS: 238 10*3/uL (ref 150–400)
RBC: 4.14 MIL/uL (ref 3.87–5.11)
RDW: 15.6 % — ABNORMAL HIGH (ref 11.5–15.5)
WBC: 9.7 10*3/uL (ref 4.0–10.5)

## 2014-05-23 MED ORDER — OXYCODONE-ACETAMINOPHEN 5-325 MG PO TABS: 1.0000 | ORAL_TABLET | Freq: Four times a day (QID) | ORAL | Status: DC | PRN

## 2014-05-23 NOTE — Progress Notes (Signed)
UR completed 

## 2014-05-23 NOTE — Progress Notes (Signed)
Pt was discharged to home by private passenger vehicle. Pt verbalized understanding of all Discharge instructions and prescriptions were given. Pt has had no other changes since AM assessment. Pt wheeled down by CNA.  MCCLAIN, Laquesha Holcomb L 05/23/2014 7:17 PM

## 2014-05-23 NOTE — Progress Notes (Signed)
Patient ID: Monique Holland, female   DOB: [birth year], 49 y.o.   MRN: [id number] 1 Day Post-Op  Subjective: Some incisional pain but feels better than before surgery. No nausea. Tolerating liquids. Getting up and down to the bathroom by herself.  Objective: Vital signs in last 24 hours: Temp:  [98.1 F (36.7 C)-98.9 F (37.2 C)] 98.4 F (36.9 C) (01/01 0600) Pulse Rate:  [90-95] 91 (01/01 0600) Resp:  [12-18] 16 (01/01 0600) BP: (100-158)/(42-75) 111/66 mmHg (01/01 0600) SpO2:  [96 %-100 %] 99 % (01/01 0600) Weight:  [330 lb (149.687 kg)] 330 lb (149.687 kg) (12/31 2129) Last BM Date: 05/20/14  Intake/Output from previous day: 12/31 0701 - 01/01 0700 In: 2105 [I.V.:2105] Out: 900 [Urine:900] Intake/Output this shift:    General appearance: alert, cooperative and no distress Lungs: Clear breath sounds without wheezing or increased work of breathing GI: appropriate incisional tenderness Incision/Wound: clean and dry  Lab Results:   Recent Labs  05/22/14 1234 05/23/14 0530  WBC 7.5 9.7  HGB 9.8* 9.4*  HCT 32.7* 31.3*  PLT 265 238   BMET  Recent Labs  05/22/14 1234  NA 134*  K 4.1  CL 107  CO2 20  GLUCOSE 124*  BUN 17  CREATININE 0.82  CALCIUM 11.3*     Studies/Results: Ct Abdomen Pelvis Wo Contrast  05/22/2014   CLINICAL DATA:  Bilateral lower abdominal pain, left greater than right, with nausea.  EXAM: CT ABDOMEN AND PELVIS WITHOUT CONTRAST  TECHNIQUE: Multidetector CT imaging of the abdomen and pelvis was performed following the standard protocol without IV contrast.  COMPARISON:  03/17/2014.  FINDINGS: Lower chest: Lung bases show no acute findings. Heart size normal. No pericardial or pleural effusion.  Hepatobiliary: Liver and gallbladder are unremarkable. No biliary ductal dilatation.  Pancreas: Negative.  Spleen: Negative.  Adrenals/Urinary Tract: Adrenal glands are unremarkable. Small stones are seen in the kidneys bilaterally which are otherwise  unremarkable. Ureters are decompressed. Bladder is unremarkable.  Stomach/Bowel: Stomach and small bowel are unremarkable. Appendix is fluid-filled and dilated, measuring 1.2 cm. There is surrounding inflammatory stranding and fluid. No extraluminal air or organized fluid collection. Colon is unremarkable.  Vascular/Lymphatic: Vascular structures are unremarkable. No pathologically enlarged lymph nodes.  Reproductive: Uterus and ovaries are visualized.  Other: No free fluid.  Mesenteries and peritoneum are unremarkable.  Musculoskeletal: No worrisome lytic or sclerotic lesions. 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  IMPRESSION: 1. Dilated and fluid-filled appendix, with surrounding inflammatory changes, most consistent with acute appendicitis. No complicating features. 2. Bilateral renal stones without obstruction.   Electronically Signed   By: Leanna Battles M.D.   On: 05/22/2014 15:43   Dg Elbow 2 Views Left  05/22/2014   CLINICAL DATA:  Contrast extravasation during CT scan.  EXAM: LEFT ELBOW - 2 VIEW  COMPARISON:  None.  FINDINGS: Extensive contrast is noted in the antecubital fossae and extending into the proximal forearm and distal biceps area.  IMPRESSION: Large volume extravasation in the region of the antecubital fossa.   Electronically Signed   By: Loralie Champagne M.D.   On: 05/22/2014 15:27    Anti-infectives: Anti-infectives    Start     Dose/Rate Route Frequency Ordered Stop   05/22/14 1745  [MAR Hold]  cefOXitin (MEFOXIN) 2 g in dextrose 5 % 50 mL IVPB     (MAR Hold since 05/22/14 1834)   2 g100 mL/hr over 30 Minutes Intravenous  Once 05/22/14 1737 05/22/14  1849      Assessment/Plan: s/p Procedure(s): APPENDECTOMY LAPAROSCOPIC Doing well postoperatively without complication. Okay for discharge.   LOS: 1 day    Monique Holland 05/23/2014

## 2014-05-23 NOTE — Discharge Instructions (Signed)
[facility] ______[facility] °LAPAROSCOPIC SURGERY: POST OP INSTRUCTIONS °Always review your discharge instruction sheet given to you by the facility where your surgery was performed. °IF YOU HAVE DISABILITY OR FAMILY LEAVE FORMS, YOU MUST BRING THEM TO THE OFFICE FOR PROCESSING.   °DO NOT GIVE THEM TO YOUR DOCTOR. ° °1. A prescription for pain medication may be given to you upon discharge.  Take your pain medication as prescribed, if needed.  If narcotic pain medicine is not needed, then you may take acetaminophen (Tylenol) or ibuprofen (Advil) as needed. °2. Take your usually prescribed medications unless otherwise directed. °3. If you need a refill on your pain medication, please contact your pharmacy.  They will contact our office to request authorization. Prescriptions will not be filled after 5pm or on week-ends. °4. You should follow a light diet the first few days after arrival home, such as soup and crackers, etc.  Be sure to include lots of fluids daily. °5. Most patients will experience some swelling and bruising in the area of the incisions.  Ice packs will help.  Swelling and bruising can take several days to resolve.  °6. It is common to experience some constipation if taking pain medication after surgery.  Increasing fluid intake and taking a stool softener (such as Colace) will usually help or prevent this problem from occurring.  A mild laxative (Milk of Magnesia or Miralax) should be taken according to package instructions if there are no bowel movements after 48 hours. °7. Unless discharge instructions indicate otherwise, you may remove your bandages 24-48 hours after surgery, and you may shower at that time.  You may have steri-strips (small skin tapes) in place directly over the incision.  These strips should be left on the skin for 7-10 days.  If your surgeon used skin glue on the incision, you may shower in 24 hours.  The glue will flake off over the next 2-3 weeks.  Any sutures or  staples will be removed at the office during your follow-up visit. °8. ACTIVITIES:  You may resume regular (light) daily activities beginning the next day--such as daily self-care, walking, climbing stairs--gradually increasing activities as tolerated.  You may have sexual intercourse when it is comfortable.  Refrain from any heavy lifting or straining until approved by your doctor. °a. You may drive when you are no longer taking prescription pain medication, you can comfortably wear a seatbelt, and you can safely maneuver your car and apply brakes. °b. RETURN TO WORK:  __________________________________________________________ °9. You should see your doctor in the office for a follow-up appointment approximately 2-3 weeks after your surgery.  Make sure that you call for this appointment within a day or two after you arrive home to insure a convenient appointment time. °10. OTHER INSTRUCTIONS: __________________________________________________________________________________________________________________________ __________________________________________________________________________________________________________________________ °WHEN TO CALL YOUR DOCTOR: °1. Fever over 101.0 °2. Inability to urinate °3. Continued bleeding from incision. °4. Increased pain, redness, or drainage from the incision. °5. Increasing abdominal pain ° °The clinic staff is available to answer your questions during regular business hours.  Please don’t hesitate to call and ask to speak to one of the nurses for clinical concerns.  If you have a medical emergency, go to the nearest emergency room or call 911.  A surgeon from [facility] is always on call at the hospital. °[address], [address], [place], [place]  [address] ? [address] [address], [place], [place]   [address] °[phone] ? [phone] ? FAX [phone] °Web site:   [email or web address] °

## 2014-05-23 NOTE — Discharge Summary (Signed)
Patient ID: Monique Holland [id number] 49 y.o. 1966/01/30  05/22/2014  Discharge date and time: 05/23/2014   Admitting Physician: Glenna Fellows T  Discharge Physician: Glenna Fellows T  Admission Diagnoses: Acute appendicitis  Discharge Diagnoses: Same  Operations: Procedure(s): APPENDECTOMY LAPAROSCOPIC  Admission Condition: fair  Discharged Condition: good  Indication for Admission: Patient is a 49 year old female who presents with 24 hours of diffuse lower abdominal pain and nausea. Workup included CT scan of the abdomen showing evidence of acute appendicitis and she is admitted for appendectomy.  Hospital Course: Patient was given broad-spectrum IV antibiotics and taken to the operating room and underwent an uneventful laparoscopic appendectomy with findings of acute appendicitis without perforation or gangrene. The following morning she has some incisional pain but her preoperative pain is relieved. She is getting up and down to the bathroom by herself and tolerating a liquid diet. Abdomen is benign. Vital signs are stable and white blood count is normal. She is felt ready for discharge.   Disposition: Home  Patient Instructions:    Medication List    TAKE these medications        albuterol-ipratropium 18-103 MCG/ACT inhaler  Commonly known as:  COMBIVENT  Inhale 2 puffs into the lungs every 6 (six) hours as needed for wheezing.     ALPRAZolam 1 MG tablet  Commonly known as:  XANAX  Take 1 mg by mouth at bedtime as needed for sleep.     amitriptyline 50 MG tablet  Commonly known as:  ELAVIL  Take 50 mg by mouth at bedtime.     amLODipine 10 MG tablet  Commonly known as:  NORVASC  Take 10 mg by mouth every morning.     clonazePAM 1 MG tablet  Commonly known as:  KLONOPIN  Take 1 mg by mouth 2 (two) times daily as needed for anxiety.     FLUoxetine 20 MG capsule  Commonly known as:  PROZAC  Take 20 mg by mouth daily.     HYDROcodone-acetaminophen  5-325 MG per tablet  Commonly known as:  NORCO/VICODIN  1-2 tablets po q 6 hours prn moderate to severe pain     ibuprofen 200 MG tablet  Commonly known as:  ADVIL,MOTRIN  Take 800 mg by mouth every 8 (eight) hours as needed.     lisinopril-hydrochlorothiazide 20-25 MG per tablet  Commonly known as:  PRINZIDE,ZESTORETIC  Take 1 tablet by mouth daily.     methylphenidate 10 MG tablet  Commonly known as:  RITALIN  Take 30 mg by mouth daily after breakfast.     ondansetron 4 MG tablet  Commonly known as:  ZOFRAN  Take 1 tablet (4 mg total) by mouth every 6 (six) hours.     oxyCODONE-acetaminophen 5-325 MG per tablet  Commonly known as:  PERCOCET/ROXICET  Take 1-2 tablets by mouth every 6 (six) hours as needed for severe pain.     pantoprazole 20 MG tablet  Commonly known as:  PROTONIX  Take 20 mg by mouth every morning.     QUEtiapine 400 MG 24 hr tablet  Commonly known as:  SEROQUEL XR  Take 400 mg by mouth at bedtime.     sertraline 100 MG tablet  Commonly known as:  ZOLOFT  Take 100 mg by mouth daily.     tamsulosin 0.4 MG Caps capsule  Commonly known as:  FLOMAX  Take 1 capsule (0.4 mg total) by mouth daily after supper.     URIBEL 118 MG Caps  Take 1 capsule  by mouth 3 (three) times daily as needed (bladder spasms).        Activity: activity as tolerated Diet: regular diet Wound Care: none needed  Follow-up:  With Dr. Johna Sheriff in 3 weeks.  Signed: Mariella Saa MD, FACS  05/23/2014, 7:57 AM

## 2014-05-26 ENCOUNTER — Encounter (HOSPITAL_COMMUNITY): Payer: Self-pay | Admitting: General Surgery

## 2014-05-29 ENCOUNTER — Encounter (HOSPITAL_COMMUNITY): Payer: Self-pay | Admitting: General Surgery

## 2014-08-13 ENCOUNTER — Ambulatory Visit (HOSPITAL_COMMUNITY): Payer: Medicaid Other | Admitting: Clinical

## 2014-09-17 ENCOUNTER — Ambulatory Visit (HOSPITAL_COMMUNITY): Payer: Self-pay | Admitting: Clinical

## 2014-12-10 IMAGING — US US RENAL
1 series · 14 of 25 positions shown · non-contrast
Comparison: CT abdomen and pelvis [DATE]

CLINICAL DATA: BILATERAL flank pain question hydroureteronephrosis

EXAM:
RENAL / URINARY TRACT ULTRASOUND COMPLETE

[Series 1: us renal · 0.33mm/px · 14 of 30 slices shown]
[im 1/30]
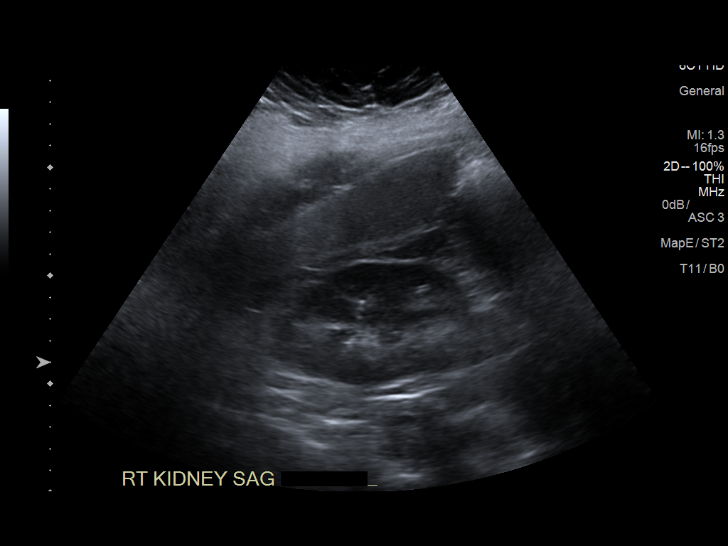
[im 3/30]
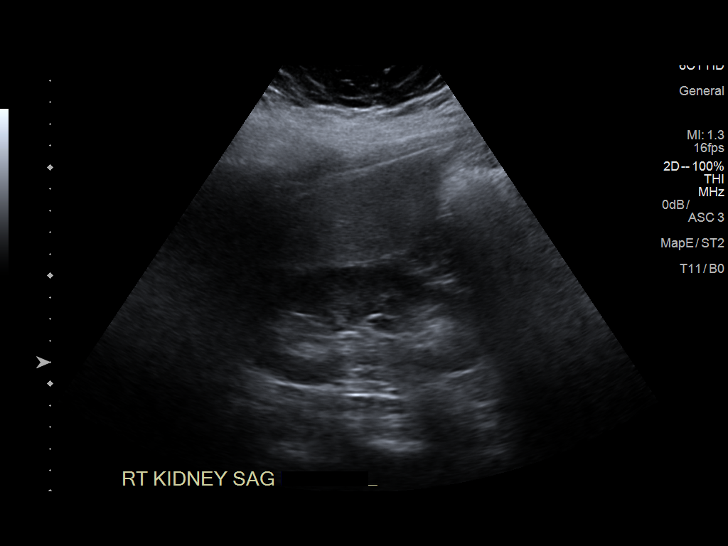
[im 5/30]
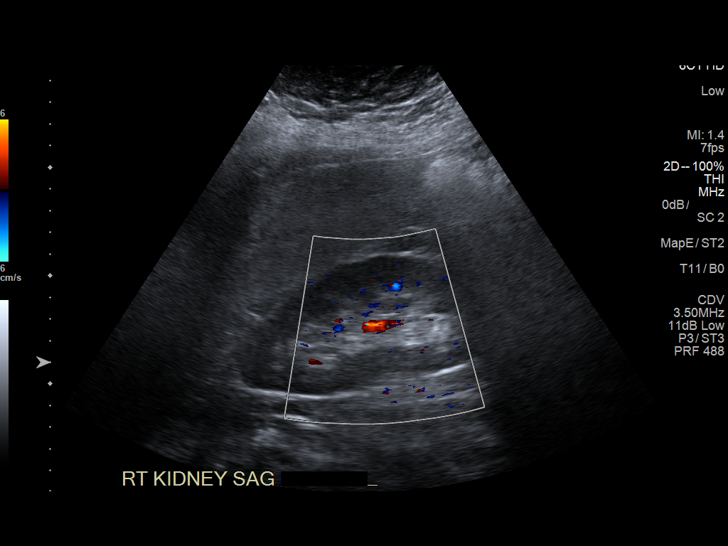
[im 8/30]
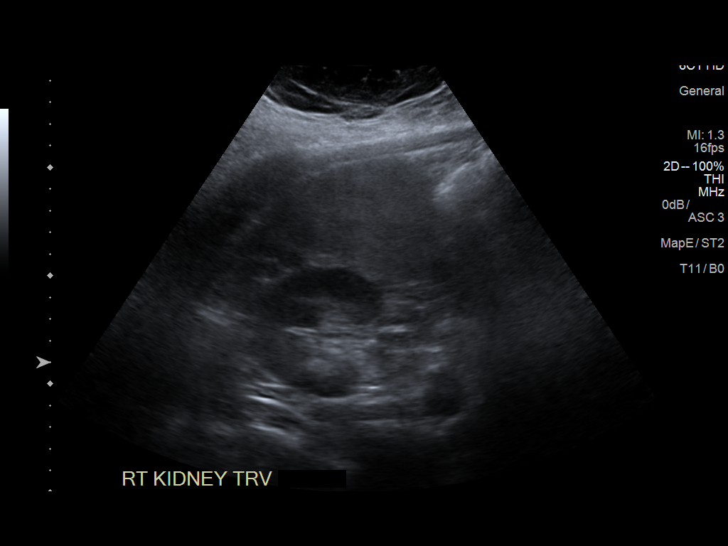
[im 10/30]
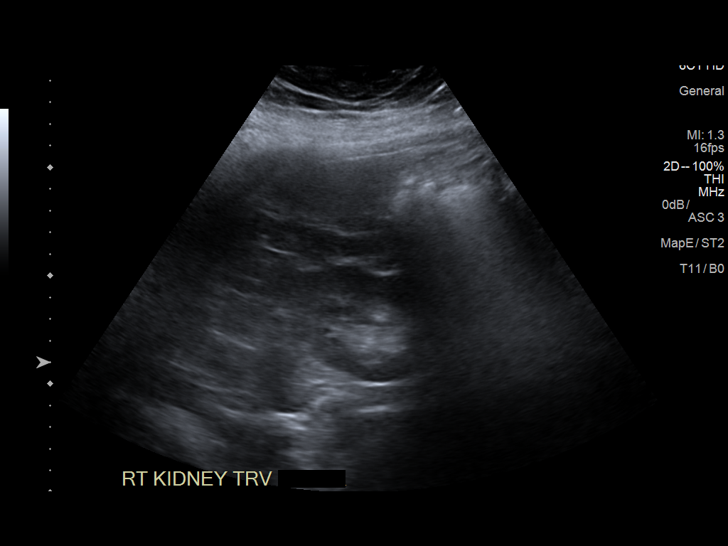
[im 11/30]
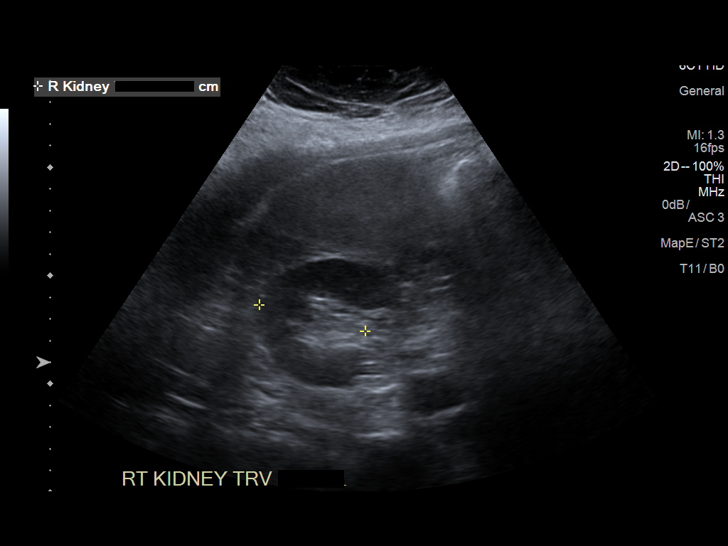
[im 14/30]
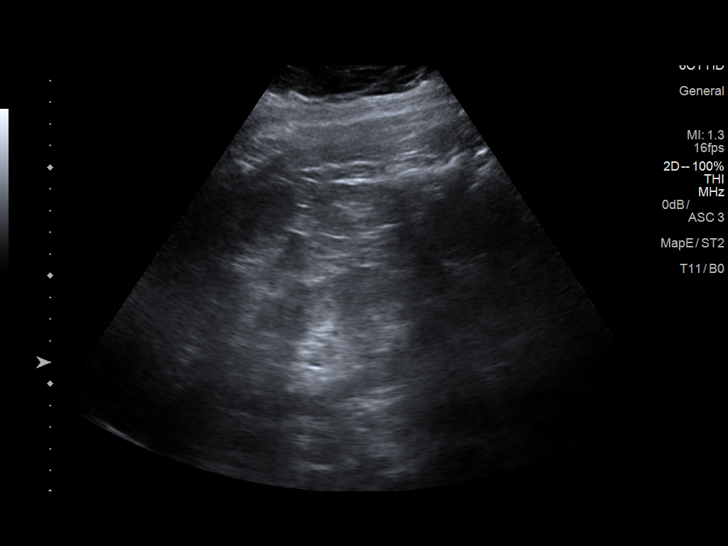
[im 16/30]
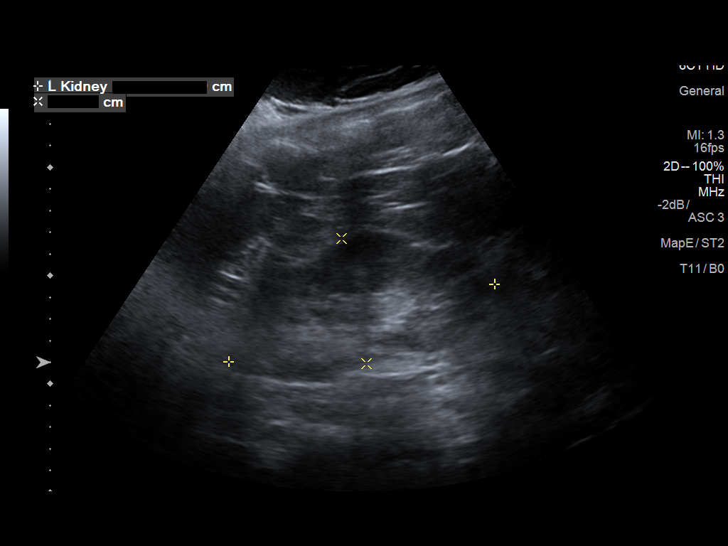
[im 19/30]
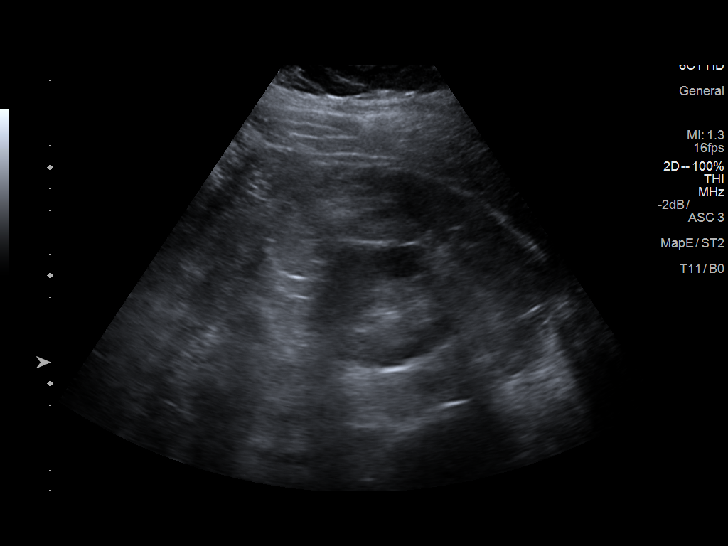
[im 20/30]
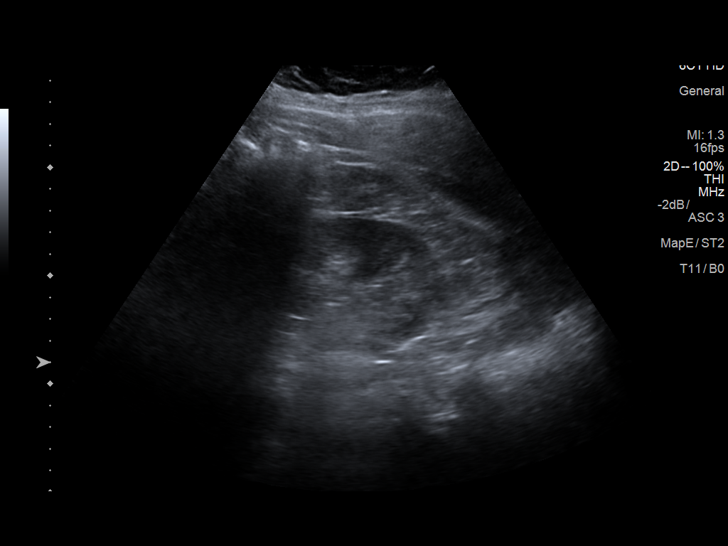
[im 22/30]
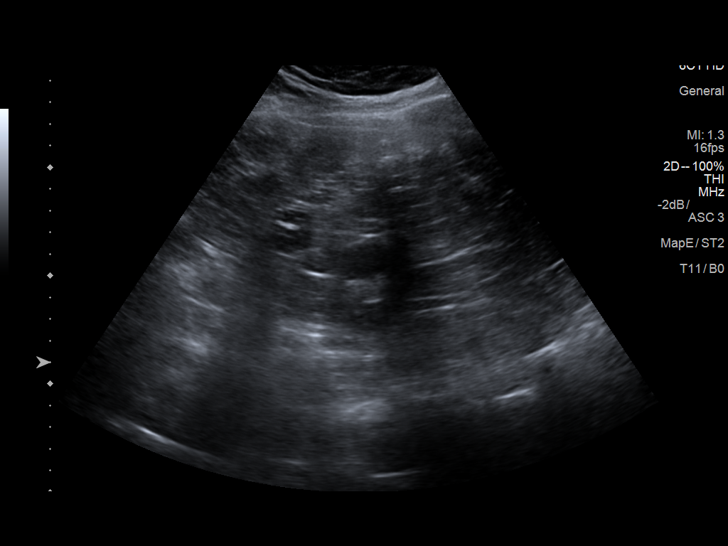
[im 25/30]
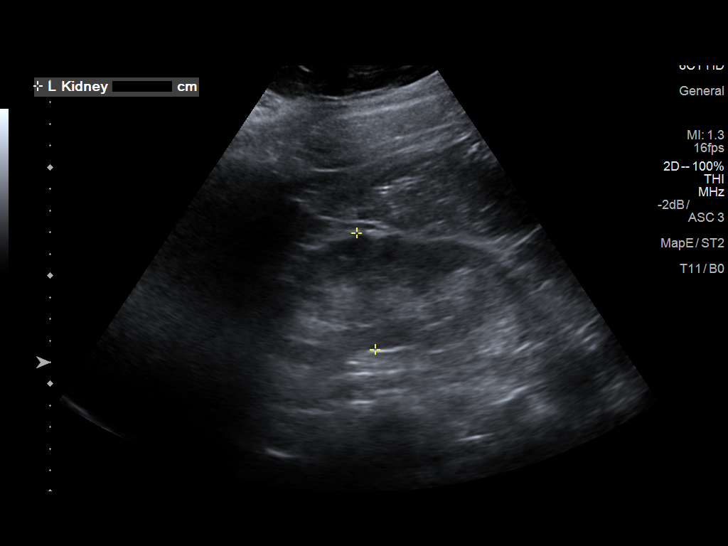
[im 27/30]
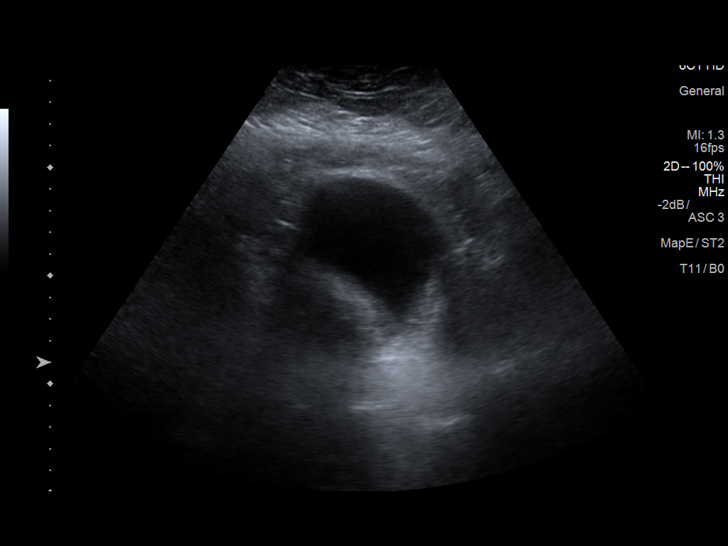
[im 30/30]
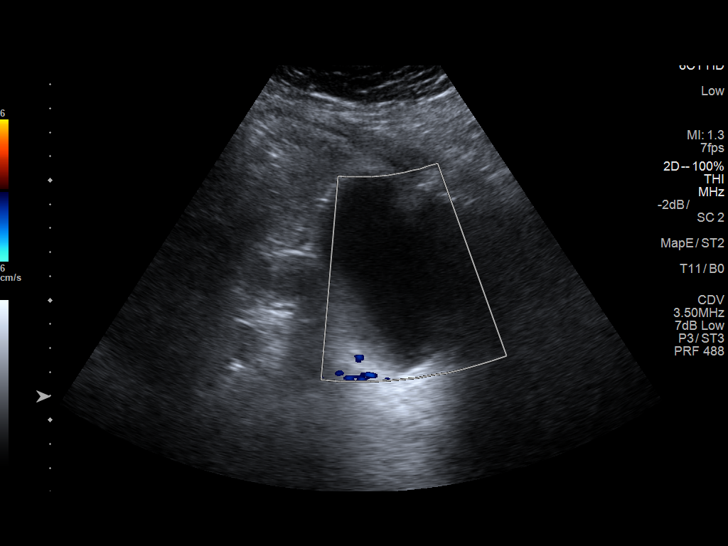

[14 of 25 positions shown; findings below may reference images not displayed]

FINDINGS: Right Kidney:

Length: 10.6 cm. Normal morphology without mass or hydronephrosis.
No shadowing calcifications.

Left Kidney:

Length: 12.8 cm. Mild cortical thinning. Upper normal cortical
echogenicity. No mass, hydronephrosis or shadowing calcification.

Bladder:

Partially distended, unremarkable.
IMPRESSION: No evidence of urinary tract mass or obstruction.

## 2015-01-25 ENCOUNTER — Emergency Department (HOSPITAL_COMMUNITY)
Admission: EM | Admit: 2015-01-25 | Discharge: 2015-01-25 | Disposition: A | Discharge status: Home / Self Care | Payer: Self-pay | Attending: Emergency Medicine | Admitting: Emergency Medicine

## 2015-01-25 ENCOUNTER — Encounter (HOSPITAL_COMMUNITY): Payer: Self-pay | Admitting: *Deleted

## 2015-01-25 ENCOUNTER — Emergency Department (HOSPITAL_COMMUNITY): Payer: Medicaid Other

## 2015-01-25 DIAGNOSIS — I10 Essential (primary) hypertension: Secondary | ICD-10-CM | POA: Insufficient documentation

## 2015-01-25 DIAGNOSIS — F419 Anxiety disorder, unspecified: Secondary | ICD-10-CM | POA: Insufficient documentation

## 2015-01-25 DIAGNOSIS — Z79899 Other long term (current) drug therapy: Secondary | ICD-10-CM | POA: Insufficient documentation

## 2015-01-25 DIAGNOSIS — F329 Major depressive disorder, single episode, unspecified: Secondary | ICD-10-CM | POA: Insufficient documentation

## 2015-01-25 DIAGNOSIS — K219 Gastro-esophageal reflux disease without esophagitis: Secondary | ICD-10-CM | POA: Insufficient documentation

## 2015-01-25 DIAGNOSIS — N200 Calculus of kidney: Secondary | ICD-10-CM | POA: Insufficient documentation

## 2015-01-25 DIAGNOSIS — J45909 Unspecified asthma, uncomplicated: Secondary | ICD-10-CM | POA: Insufficient documentation

## 2015-01-25 DIAGNOSIS — R109 Unspecified abdominal pain: Secondary | ICD-10-CM

## 2015-01-25 LAB — CBC WITH DIFFERENTIAL/PLATELET
Basophils Absolute: 0 10*3/uL (ref 0.0–0.1)
Basophils Relative: 0 % (ref 0–1)
Eosinophils Absolute: 0.2 10*3/uL (ref 0.0–0.7)
Eosinophils Relative: 3 % (ref 0–5)
HCT: 32.8 % — ABNORMAL LOW (ref 36.0–46.0)
Hemoglobin: 9.7 g/dL — ABNORMAL LOW (ref 12.0–15.0)
Lymphocytes Relative: 37 % (ref 12–46)
Lymphs Abs: 2.9 10*3/uL (ref 0.7–4.0)
MCH: 22.8 pg — ABNORMAL LOW (ref 26.0–34.0)
MCHC: 29.6 g/dL — ABNORMAL LOW (ref 30.0–36.0)
MCV: 77.2 fL — ABNORMAL LOW (ref 78.0–100.0)
Monocytes Absolute: 0.7 10*3/uL (ref 0.1–1.0)
Monocytes Relative: 9 % (ref 3–12)
Neutro Abs: 3.9 10*3/uL (ref 1.7–7.7)
Neutrophils Relative %: 51 % (ref 43–77)
Platelets: 266 10*3/uL (ref 150–400)
RBC: 4.25 MIL/uL (ref 3.87–5.11)
RDW: 15.1 % (ref 11.5–15.5)
WBC: 7.6 10*3/uL (ref 4.0–10.5)

## 2015-01-25 LAB — BASIC METABOLIC PANEL
Anion gap: 6 (ref 5–15)
BUN: 8 mg/dL (ref 6–20)
CO2: 25 mmol/L (ref 22–32)
Calcium: 11.4 mg/dL — ABNORMAL HIGH (ref 8.9–10.3)
Chloride: 107 mmol/L (ref 101–111)
Creatinine, Ser: 0.92 mg/dL (ref 0.44–1.00)
GFR calc Af Amer: >60 mL/min (ref >60)
GFR calc non Af Amer: >60 mL/min (ref >60)
Glucose, Bld: 124 mg/dL — ABNORMAL HIGH (ref 65–99)
Potassium: 3.8 mmol/L (ref 3.5–5.1)
Sodium: 138 mmol/L (ref 135–145)

## 2015-01-25 LAB — URINALYSIS, ROUTINE W REFLEX MICROSCOPIC
Bilirubin Urine: NEGATIVE
Glucose, UA: NEGATIVE mg/dL
Hgb urine dipstick: NEGATIVE
KETONES UR: NEGATIVE mg/dL
NITRITE: NEGATIVE
PROTEIN: NEGATIVE mg/dL
Specific Gravity, Urine: 1.009 (ref 1.005–1.030)
UROBILINOGEN UA: 0.2 mg/dL (ref 0.0–1.0)
pH: 7.5 (ref 5.0–8.0)

## 2015-01-25 LAB — I-STAT BETA HCG BLOOD, ED (MC, WL, AP ONLY): I-stat hCG, quantitative: <5 m[IU]/mL (ref <5)

## 2015-01-25 LAB — URINE MICROSCOPIC-ADD ON

## 2015-01-25 IMAGING — CT CT RENAL STONE PROTOCOL
2 of 4 series · 17 of 46 positions shown, 19 images · non-contrast
Comparison: CT dated [DATE]

CLINICAL DATA: 48-year-old female with right flank pain.

EXAM:
CT ABDOMEN AND PELVIS WITHOUT CONTRAST
TECHNIQUE: Multidetector CT imaging of the abdomen and pelvis was performed
following the standard protocol without IV contrast.

[Series 2: stone study 5.0 i30f 1 · axial · 0.88mm/px · z∈[-480,-40]mm · 14 of 96 slices shown, 16 images]
[im 4/96  soft-tissue]
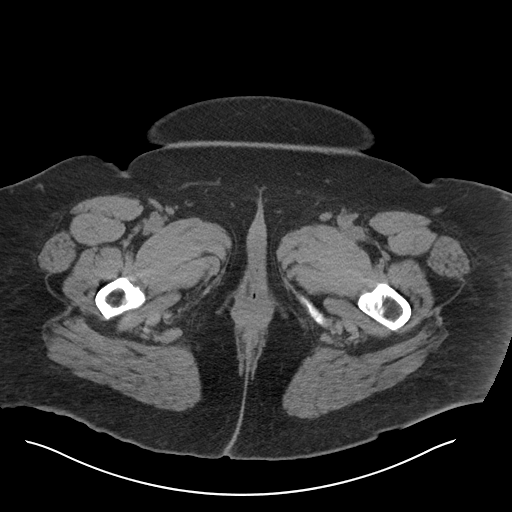
[im 4/96  bone]
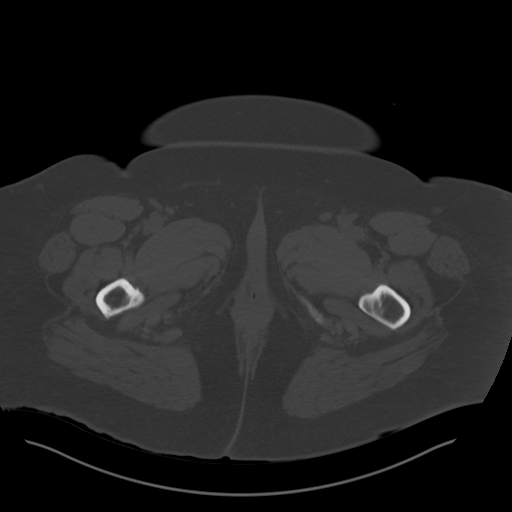
[im 12/96  soft-tissue]
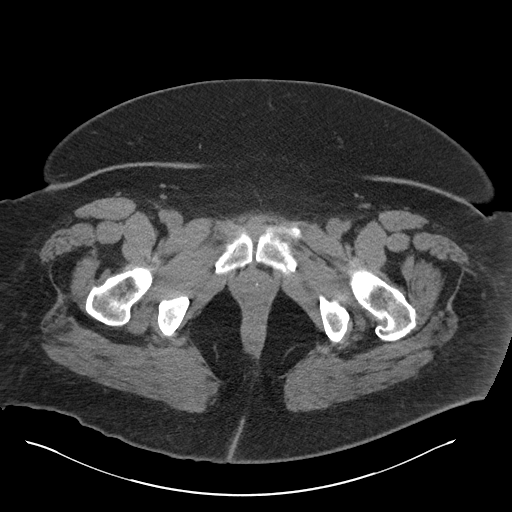
[im 20/96  soft-tissue]
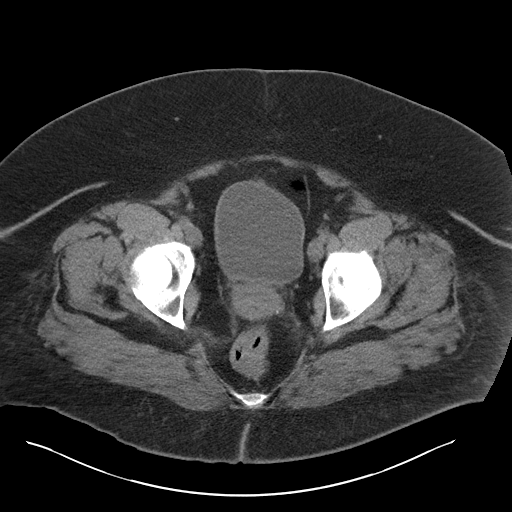
[im 27/96  soft-tissue]
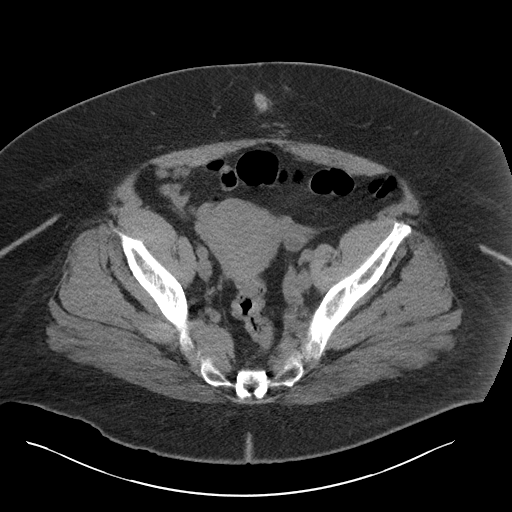
[im 31/96  soft-tissue]
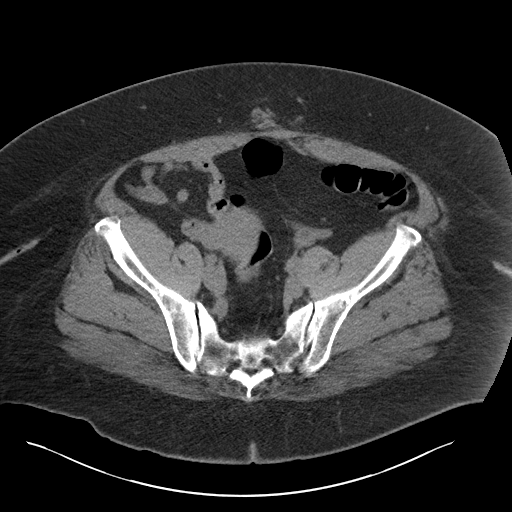
[im 39/96  soft-tissue]
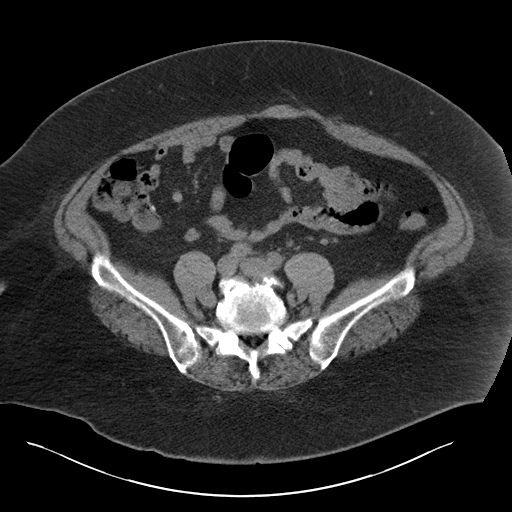
[im 46/96  soft-tissue]
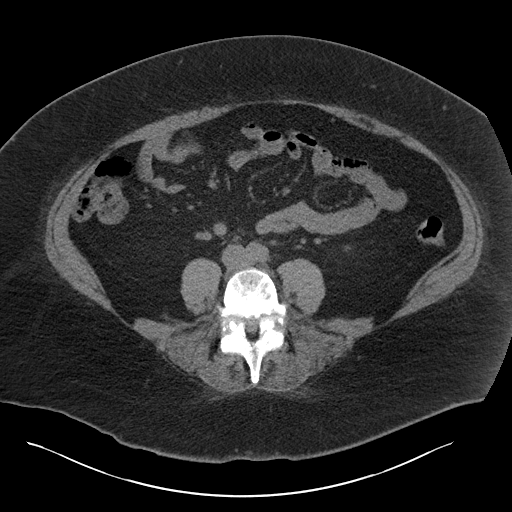
[im 50/96  soft-tissue]
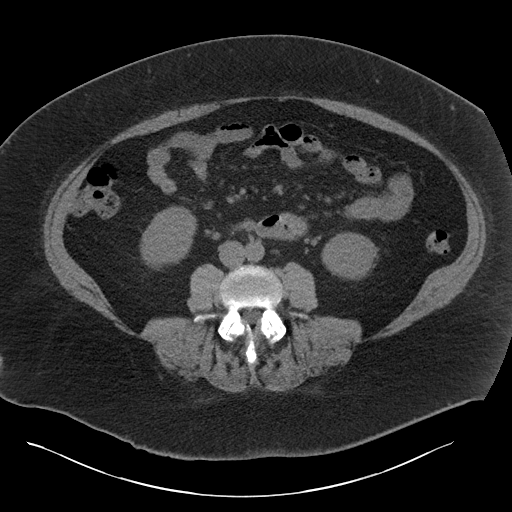
[im 58/96  soft-tissue]
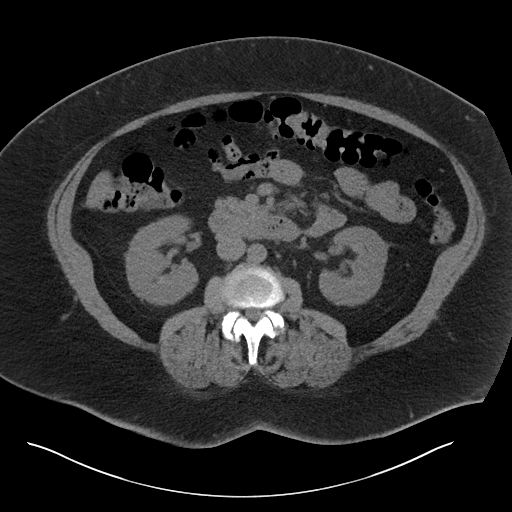
[im 58/96  bone]
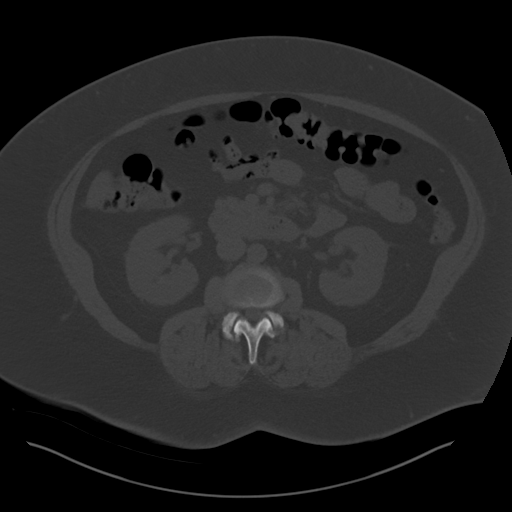
[im 65/96  soft-tissue]
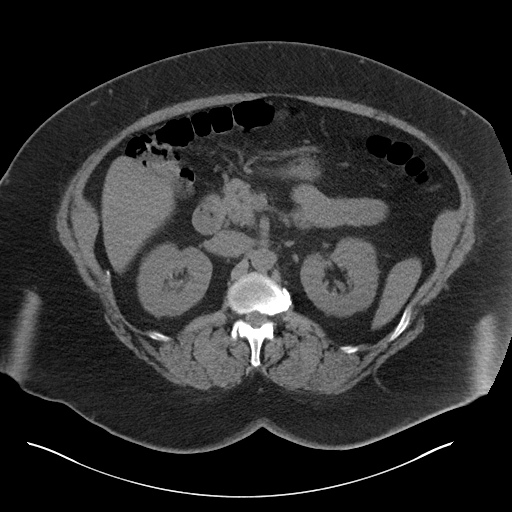
[im 73/96  soft-tissue]
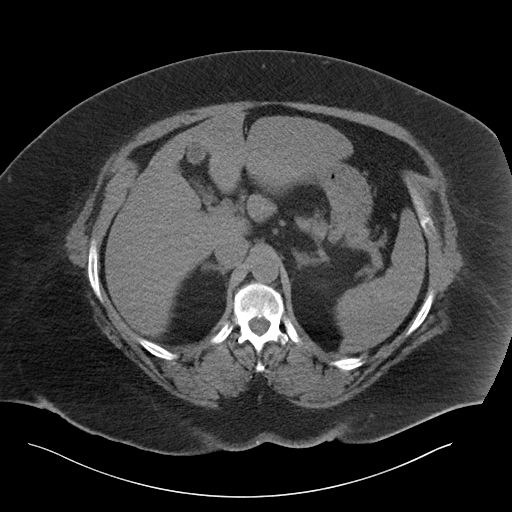
[im 77/96  soft-tissue]
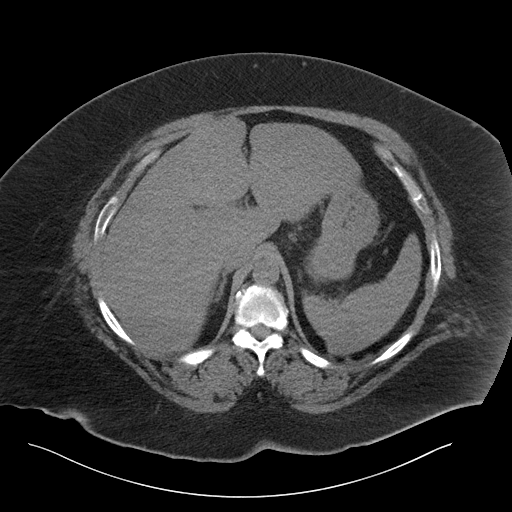
[im 84/96  soft-tissue]
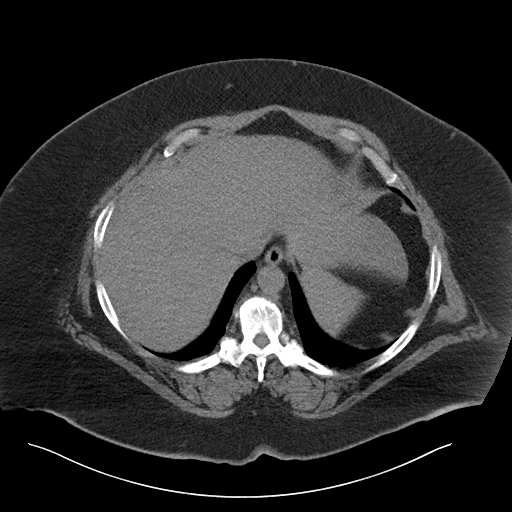
[im 92/96  soft-tissue]
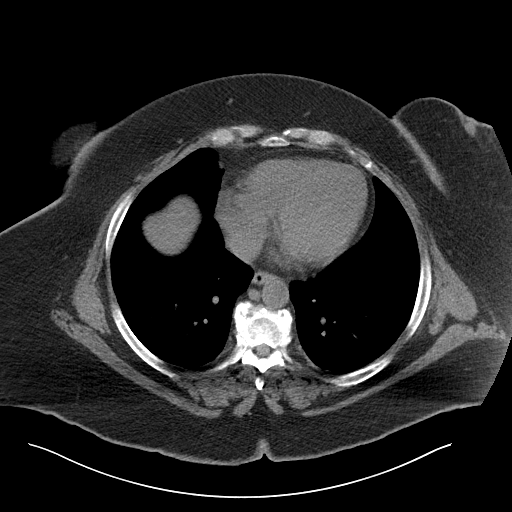

[Series 5: coronal soft tissue · coronal · 0.93mm/px · 3 of 108 slices shown]
[im 36/108  soft-tissue]
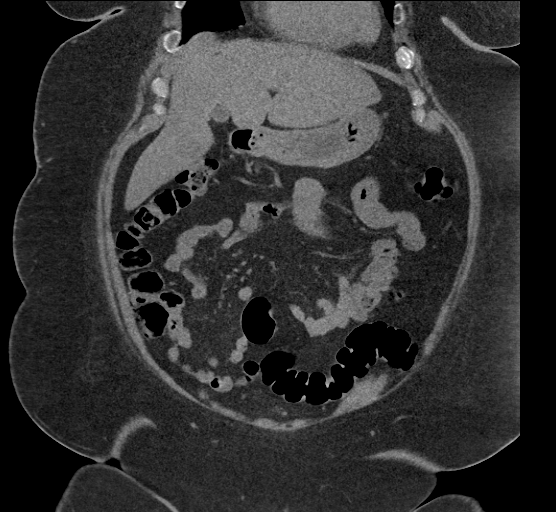
[im 48/108  soft-tissue]
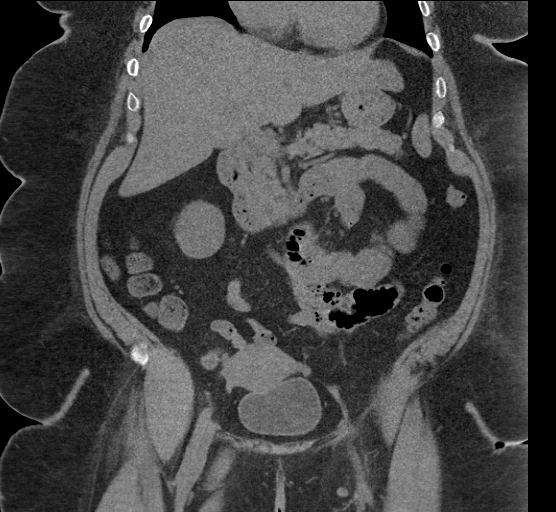
[im 60/108  soft-tissue]
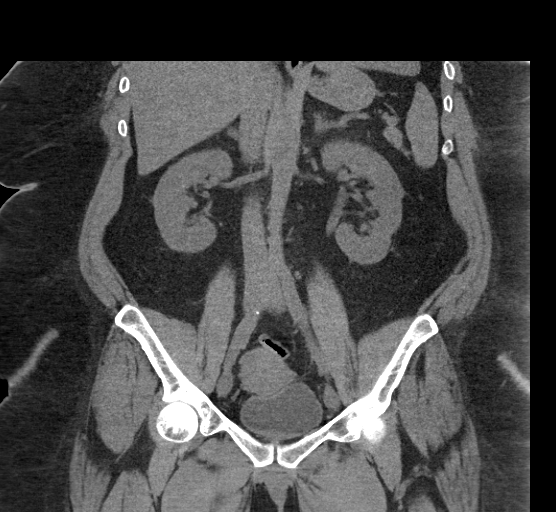

[17 of 46 positions shown; findings below may reference images not displayed]

FINDINGS: Evaluation of this exam is limited in the absence of intravenous
contrast.

The visualized lung bases are clear. No intra-abdominal free air or
free fluid.

The liver, gallbladder, pancreas, spleen, adrenal glands appear
unremarkable. There small bilateral nonobstructing renal calculi
measuring up to 5 mm in the mid/upper pole of the right kidney there
is no hydronephrosis on either side. The visualized ureters and
urinary bladder appear unremarkable. The uterus is anteverted and
grossly unremarkable. A 1.5 cm right adnexal cystic lesion
identified. Ultrasound may provide better evaluation of the pelvic
structures.

There is no evidence of bowel obstruction or inflammation.
Appendectomy. The visualized abdominal aorta and IVC are grossly
unremarkable. No lymphadenopathy. Midline vertical anterior pelvic
wall incisional scar. Degenerative changes of the spine. No acute
fracture. There are bilateral L4 pars defects with grade 1 L4 /5
anterolisthesis. L4-5 disc desiccation and vacuum phenomenon noted.
IMPRESSION: Small nonobstructing bilateral renal calculi measuring up to 5 mm in
the right kidney. No hydronephrosis.

No evidence of bowel obstruction or inflammation.

A 1.5 cm right adnexal cystic lesion. Ultrasound may provide better
evaluation.

## 2015-01-25 MED ORDER — KETOROLAC TROMETHAMINE 30 MG/ML IJ SOLN
30.0000 mg | Freq: Once | INTRAMUSCULAR | Status: AC
  Administered 2015-01-25: 30 mg via INTRAMUSCULAR
  Filled 2015-01-25: qty 1

## 2015-01-25 MED ORDER — OXYCODONE-ACETAMINOPHEN 5-325 MG PO TABS
1.0000 | ORAL_TABLET | ORAL | Status: DC | PRN
Start: 2015-01-25 — End: 2015-02-12

## 2015-01-25 NOTE — ED Notes (Signed)
Pt c/o right sided flank pain x 3 days. Worse with movement or cough. Denies N/V. Pt has hx of kidney stones on the left side and states that the pain is the same.

## 2015-01-25 NOTE — Discharge Instructions (Signed)
Please use medication as directed, please follow-up with your primary care provider and nephrologist for further evaluation and management. Please return immediately if new or worsening signs or symptoms present.

## 2015-01-25 NOTE — ED Notes (Signed)
PA at bedside.

## 2015-01-25 NOTE — ED Notes (Signed)
Pt ambulated to room with steady gait.

## 2015-01-25 NOTE — ED Provider Notes (Signed)
CSN: [id number]     Arrival date & time 01/25/15  1645 History   First MD Initiated Contact with Patient 01/25/15 1923     Chief Complaint  Patient presents with  . Flank Pain    HPI   49 year old female with a history of kidney stones presents today with right flank pain. Patient reports that she's had kidney stones previously on the left, reports the pain she is feeling is similar to that. She notes that it is sharp in nature, intermittent. She reports it's located in her right flank. Patient denies fever, chills, nausea, vomiting, abdominal pain, changes in the color clarity or characteristics of her urine. Patient denies lotion swelling or edema, diarrhea.  Past Medical History  Diagnosis Date  . Hypertension   . Schizophrenia   . Ureteral calculi     BILATERAL  . OSA (obstructive sleep apnea)     pt had study done oct 2014--  schedule for cpap titrate after kidney stone surgery  . Frequency of urination   . GERD (gastroesophageal reflux disease)   . Wears glasses   . History of asthma     last episode yrs ago  . Depression   . Anxiety   . Asthma    Past Surgical History  Procedure Laterality Date  . Cesarean section  1991  &  2002    w/ bilateral tubal ligation in 2002  . Tonsillectomy  age 48  . Cystoscopy with retrograde pyelogram, ureteroscopy and stent placement Bilateral 05/02/2013    Procedure: CYSTOSCOPY WITH RETROGRADE PYELOGRAM, URETEROSCOPY ;  Surgeon: Valetta Fuller, MD;  Location: [facility];  Service: Urology;  Laterality: Bilateral;  . Holmium laser application Bilateral 05/02/2013    Procedure: HOLMIUM LASER APPLICATION;  Surgeon: Valetta Fuller, MD;  Location: [facility];  Service: Urology;  Laterality: Bilateral;  . Cystoscopy with stent placement Left 05/02/2013    Procedure: CYSTOSCOPY WITH STENT PLACEMENT;  Surgeon: Valetta Fuller, MD;  Location: [facility];  Service: Urology;  Laterality: Left;  .  Laparoscopic appendectomy N/A 05/22/2014    Procedure: APPENDECTOMY LAPAROSCOPIC;  Surgeon: Glenna Fellows, MD;  Location: [facility];  Service: General;  Laterality: N/A;   No family history on file. Social History  Substance Use Topics  . Smoking status: Never Smoker   . Smokeless tobacco: Never Used  . Alcohol Use: No   OB History    No data available     Review of Systems  All other systems reviewed and are negative.   Allergies  Review of patient's allergies indicates no known allergies.  Home Medications   Prior to Admission medications   Medication Sig Start Date End Date Taking? Authorizing Provider  ALPRAZolam Prudy Feeler) 1 MG tablet Take 1 mg by mouth at bedtime as needed for anxiety or sleep.    Yes Historical Provider, MD  amitriptyline (ELAVIL) 50 MG tablet Take 50 mg by mouth at bedtime.   Yes Historical Provider, MD  amLODipine (NORVASC) 10 MG tablet Take 10 mg by mouth every morning.    Yes Historical Provider, MD  clonazePAM (KLONOPIN) 1 MG tablet Take 1 mg by mouth 2 (two) times daily as needed for anxiety.   Yes Historical Provider, MD  FLUoxetine (PROZAC) 20 MG capsule Take 20 mg by mouth daily.   Yes Historical Provider, MD  HYDROcodone-acetaminophen (NORCO/VICODIN) 5-325 MG per tablet 1-2 tablets po q 6 hours prn moderate to severe pain 04/24/13  Yes Quita Skye,  MD  methylphenidate (RITALIN) 10 MG tablet Take 30 mg by mouth daily after breakfast.   Yes Historical Provider, MD  pantoprazole (PROTONIX) 20 MG tablet Take 20 mg by mouth every morning.    Yes Historical Provider, MD  sertraline (ZOLOFT) 100 MG tablet Take 100 mg by mouth daily.   Yes Historical Provider, MD  lisinopril-hydrochlorothiazide (PRINZIDE,ZESTORETIC) 20-25 MG per tablet Take 1 tablet by mouth daily.    Historical Provider, MD  ondansetron (ZOFRAN) 4 MG tablet Take 1 tablet (4 mg total) by mouth every 6 (six) hours. Patient taking differently: Take 4 mg by mouth every 6 (six) hours as needed  for nausea or vomiting.  03/17/14   Junious Silk, PA-C  oxyCODONE-acetaminophen (PERCOCET/ROXICET) 5-325 MG per tablet Take 1 tablet by mouth every 4 (four) hours as needed for severe pain. 01/25/15   Eyvonne Mechanic, PA-C  tamsulosin (FLOMAX) 0.4 MG CAPS capsule Take 1 capsule (0.4 mg total) by mouth daily after supper. 04/24/13   Quita Skye, MD   BP 124/70 mmHg  Pulse 71  Temp(Src) 98.6 F (37 C) (Oral)  Resp 16  Ht  (1.803 m)  Wt 370 lb (167.831 kg)  BMI 51.63 kg/m2  SpO2 100%  LMP    Physical Exam  Constitutional: She is oriented to person, place, and time. She appears well-developed and well-nourished.  HENT:  Head: Normocephalic and atraumatic.  Eyes: Conjunctivae are normal. Pupils are equal, round, and reactive to light. Right eye exhibits no discharge. Left eye exhibits no discharge. No scleral icterus.  Neck: Normal range of motion. No JVD present. No tracheal deviation present.  Cardiovascular: Regular rhythm, normal heart sounds and intact distal pulses.  Exam reveals no gallop and no friction rub.   No murmur heard. Pulmonary/Chest: Effort normal and breath sounds normal. No stridor. No respiratory distress. She has no wheezes. She has no rales. She exhibits no tenderness.  Abdominal: Soft. Bowel sounds are normal. She exhibits no distension and no mass. There is tenderness. There is no rebound and no guarding.  Tenderness to deep palpation of the right flank difficult exam due to patient body habitus  Musculoskeletal: Normal range of motion. She exhibits no edema or tenderness.  Neurological: She is alert and oriented to person, place, and time. Coordination normal.  Skin: Skin is warm and dry. No rash noted. No erythema. No pallor.  Psychiatric: She has a normal mood and affect. Her behavior is normal. Judgment and thought content normal.  Nursing note and vitals reviewed.   ED Course  Procedures (including critical care time) Labs Review Labs Reviewed   URINALYSIS, ROUTINE W REFLEX MICROSCOPIC (NOT AT [facility]) - Abnormal; Notable for the following:    Leukocytes, UA TRACE (*)    All other components within normal limits  CBC WITH DIFFERENTIAL/PLATELET - Abnormal; Notable for the following:    Hemoglobin 9.7 (*)    HCT 32.8 (*)    MCV 77.2 (*)    MCH 22.8 (*)    MCHC 29.6 (*)    All other components within normal limits  BASIC METABOLIC PANEL - Abnormal; Notable for the following:    Glucose, Bld 124 (*)    Calcium 11.4 (*)    All other components within normal limits  URINE MICROSCOPIC-ADD ON  I-STAT BETA HCG BLOOD, ED ([facility], [facility], [facility] ONLY)    Imaging Review No results found. I have personally reviewed and evaluated these images and lab results as part of my medical decision-making.   EKG  Interpretation None      MDM   Final diagnoses:  Flank pain  Kidney stone    Labs: I-STAT beta-hCG, CBC, BMP, urinalysis- significant for hemoglobin of 9.7, this is baseline for patient  Imaging: CT renal shows small nonobstructing bilateral renal calculi measuring up to 5 mm in the right kidney no signs of hydronephrosis  Consults:  Therapeutics: Toradol  Discharge Meds: Hydrocodone  Assessment/Plan: Patient presents with nonobstructing renal calculi, no signs of hydronephrosis. Patient is afebrile, nontoxic, tolerating by mouth, vital signs reassuring, labs are reassuring. Patient will be discharged home with oral pain medication, instructions to follow-up with her primary care provider first thing this week for reevaluation. She is instructed to return immediately if new or worsening signs or symptoms present. Patient will be given nephrology follow-up information if symptoms continue to persist. Patient was found to have a right adnexal cystic lesion, this is likely incidental, she is encouraged to inform her primary care provider of this for further evaluation. Patient has no acute concerns at the time of discharge, verbalized  understanding and agreement to today's plan.          Eyvonne Mechanic, PA-C 01/28/15 1502  Glynn Octave, MD 01/28/15 [phone]

## 2015-02-12 ENCOUNTER — Emergency Department (HOSPITAL_COMMUNITY): Payer: Medicare Other

## 2015-02-12 ENCOUNTER — Emergency Department (HOSPITAL_COMMUNITY)
Admission: EM | Admit: 2015-02-12 | Discharge: 2015-02-12 | Disposition: A | Discharge status: Home / Self Care | Payer: Medicare Other | Attending: Emergency Medicine | Admitting: Emergency Medicine

## 2015-02-12 ENCOUNTER — Encounter (HOSPITAL_COMMUNITY): Payer: Self-pay | Admitting: Emergency Medicine

## 2015-02-12 DIAGNOSIS — J45909 Unspecified asthma, uncomplicated: Secondary | ICD-10-CM | POA: Insufficient documentation

## 2015-02-12 DIAGNOSIS — F209 Schizophrenia, unspecified: Secondary | ICD-10-CM | POA: Diagnosis not present

## 2015-02-12 DIAGNOSIS — G4733 Obstructive sleep apnea (adult) (pediatric): Secondary | ICD-10-CM | POA: Insufficient documentation

## 2015-02-12 DIAGNOSIS — N200 Calculus of kidney: Secondary | ICD-10-CM

## 2015-02-12 DIAGNOSIS — I10 Essential (primary) hypertension: Secondary | ICD-10-CM | POA: Diagnosis not present

## 2015-02-12 DIAGNOSIS — Z3202 Encounter for pregnancy test, result negative: Secondary | ICD-10-CM | POA: Diagnosis not present

## 2015-02-12 DIAGNOSIS — R109 Unspecified abdominal pain: Secondary | ICD-10-CM | POA: Diagnosis present

## 2015-02-12 DIAGNOSIS — Z9981 Dependence on supplemental oxygen: Secondary | ICD-10-CM | POA: Insufficient documentation

## 2015-02-12 DIAGNOSIS — K219 Gastro-esophageal reflux disease without esophagitis: Secondary | ICD-10-CM | POA: Diagnosis not present

## 2015-02-12 DIAGNOSIS — F329 Major depressive disorder, single episode, unspecified: Secondary | ICD-10-CM | POA: Insufficient documentation

## 2015-02-12 DIAGNOSIS — Z79899 Other long term (current) drug therapy: Secondary | ICD-10-CM | POA: Diagnosis not present

## 2015-02-12 DIAGNOSIS — F419 Anxiety disorder, unspecified: Secondary | ICD-10-CM | POA: Diagnosis not present

## 2015-02-12 LAB — I-STAT BETA HCG BLOOD, ED (MC, WL, AP ONLY): I-stat hCG, quantitative: <5 m[IU]/mL (ref <5)

## 2015-02-12 LAB — CBC WITH DIFFERENTIAL/PLATELET
Basophils Absolute: 0 10*3/uL (ref 0.0–0.1)
Basophils Relative: 0 %
Eosinophils Absolute: 0.2 10*3/uL (ref 0.0–0.7)
Eosinophils Relative: 2 %
HEMATOCRIT: 35.9 % — AB (ref 36.0–46.0)
HEMOGLOBIN: 10.7 g/dL — AB (ref 12.0–15.0)
LYMPHS ABS: 2.8 10*3/uL (ref 0.7–4.0)
LYMPHS PCT: 39 %
MCH: 23.3 pg — AB (ref 26.0–34.0)
MCHC: 29.8 g/dL — AB (ref 30.0–36.0)
MCV: 78 fL (ref 78.0–100.0)
MONOS PCT: 9 %
Monocytes Absolute: 0.7 10*3/uL (ref 0.1–1.0)
NEUTROS ABS: 3.5 10*3/uL (ref 1.7–7.7)
NEUTROS PCT: 50 %
Platelets: 272 10*3/uL (ref 150–400)
RBC: 4.6 MIL/uL (ref 3.87–5.11)
RDW: 15.1 % (ref 11.5–15.5)
WBC: 7.2 10*3/uL (ref 4.0–10.5)

## 2015-02-12 LAB — COMPREHENSIVE METABOLIC PANEL
ALBUMIN: 3.5 g/dL (ref 3.5–5.0)
ALK PHOS: 62 U/L (ref 38–126)
ALT: 48 U/L (ref 14–54)
ANION GAP: 9 (ref 5–15)
AST: 73 U/L — ABNORMAL HIGH (ref 15–41)
BILIRUBIN TOTAL: 0.6 mg/dL (ref 0.3–1.2)
BUN: 7 mg/dL (ref 6–20)
CALCIUM: 11 mg/dL — AB (ref 8.9–10.3)
CO2: 22 mmol/L (ref 22–32)
CREATININE: 1.11 mg/dL — AB (ref 0.44–1.00)
Chloride: 105 mmol/L (ref 101–111)
GFR calc Af Amer: >60 mL/min (ref >60)
GFR calc non Af Amer: 58 mL/min — ABNORMAL LOW (ref >60)
GLUCOSE: 175 mg/dL — AB (ref 65–99)
Potassium: 3.6 mmol/L (ref 3.5–5.1)
Sodium: 136 mmol/L (ref 135–145)
TOTAL PROTEIN: 6.2 g/dL — AB (ref 6.5–8.1)

## 2015-02-12 LAB — URINALYSIS, ROUTINE W REFLEX MICROSCOPIC
BILIRUBIN URINE: NEGATIVE
Glucose, UA: NEGATIVE mg/dL
HGB URINE DIPSTICK: NEGATIVE
KETONES UR: NEGATIVE mg/dL
Nitrite: NEGATIVE
PROTEIN: NEGATIVE mg/dL
Specific Gravity, Urine: 1.01 (ref 1.005–1.030)
UROBILINOGEN UA: 0.2 mg/dL (ref 0.0–1.0)
pH: 7.5 (ref 5.0–8.0)

## 2015-02-12 LAB — URINE MICROSCOPIC-ADD ON

## 2015-02-12 MED ORDER — HYDROMORPHONE HCL 1 MG/ML IJ SOLN
1.0000 mg | Freq: Once | INTRAMUSCULAR | Status: AC
  Administered 2015-02-12: 1 mg via INTRAVENOUS
  Filled 2015-02-12: qty 1

## 2015-02-12 MED ORDER — OXYCODONE-ACETAMINOPHEN 5-325 MG PO TABS
ORAL_TABLET | ORAL | Status: AC
  Filled 2015-02-12: qty 1

## 2015-02-12 MED ORDER — SODIUM CHLORIDE 0.9 % IV BOLUS (SEPSIS)
1000.0000 mL | Freq: Once | INTRAVENOUS | Status: AC
  Administered 2015-02-12: 1000 mL via INTRAVENOUS

## 2015-02-12 MED ORDER — OXYCODONE-ACETAMINOPHEN 5-325 MG PO TABS
1.0000 | ORAL_TABLET | Freq: Once | ORAL | Status: AC
  Administered 2015-02-12: 1 via ORAL

## 2015-02-12 MED ORDER — HYDROCODONE-ACETAMINOPHEN 5-325 MG PO TABS: 1.0000 | ORAL_TABLET | Freq: Four times a day (QID) | ORAL | Status: DC | PRN

## 2015-02-12 NOTE — Discharge Instructions (Signed)

## 2015-02-12 NOTE — ED Notes (Signed)
Here 2 weeks ago for flank pain with kidney stones (per pt)-- was d/c'd with rx for pain meds -- has been out of meds for 2 days== states has flank pain on both sides.

## 2015-02-12 NOTE — ED Provider Notes (Signed)
CSN: [id number]     Arrival date & time 02/12/15  1221 History   First MD Initiated Contact with Patient 02/12/15 1807     Chief Complaint  Patient presents with  . Flank Pain  . Nephrolithiasis   Patient is a 49 y.o. female presenting with general illness. The history is provided by the patient. No language interpreter was used.  Illness Location:  Bilateral flanks Quality:  Pain Severity:  Moderate Onset quality:  Gradual Timing:  Constant Progression:  Waxing and waning Chronicity:  Recurrent Context:  PMHx of HTN, GERD, schizophrenia, asthma, OSA, & nephrolithiasis presenting with bilateral flank pain. Onset several weeks ago. Of note patient seen in our ED on 01/28/15 at which time she underwent a CT abdomen pelvis without contrast showing a 1.5 cm right adnexal cyst, multiple small nonobstructing bilateral renal calculi measuring up to 5 mm but no evidence of hydronephrosis. Patient discharged home on PO analgesia. Associated symptoms: abdominal pain (bilateral flank)   Associated symptoms: no chest pain, no cough, no diarrhea, no fever, no nausea, no shortness of breath and no vomiting     Past Medical History  Diagnosis Date  . Hypertension   . Schizophrenia   . Ureteral calculi     BILATERAL  . OSA (obstructive sleep apnea)     pt had study done oct 2014--  schedule for cpap titrate after kidney stone surgery  . Frequency of urination   . GERD (gastroesophageal reflux disease)   . Wears glasses   . History of asthma     last episode yrs ago  . Depression   . Anxiety   . Asthma    Past Surgical History  Procedure Laterality Date  . Cesarean section  1991  &  2002    w/ bilateral tubal ligation in 2002  . Tonsillectomy  age 35  . Cystoscopy with retrograde pyelogram, ureteroscopy and stent placement Bilateral 05/02/2013    Procedure: CYSTOSCOPY WITH RETROGRADE PYELOGRAM, URETEROSCOPY ;  Surgeon: Valetta Fuller, MD;  Location: [facility];  Service:  Urology;  Laterality: Bilateral;  . Holmium laser application Bilateral 05/02/2013    Procedure: HOLMIUM LASER APPLICATION;  Surgeon: Valetta Fuller, MD;  Location[facility];  Service: Urology;  Laterality: Bilateral;  . Cystoscopy with stent placement Left 05/02/2013    Procedure: CYSTOSCOPY WITH STENT PLACEMENT;  Surgeon: Valetta Fuller, MD;  Location: [facility];  Service: Urology;  Laterality: Left;  . Laparoscopic appendectomy N/A 05/22/2014    Procedure: APPENDECTOMY LAPAROSCOPIC;  Surgeon: Glenna Fellows, MD;  Location: [facility];  Service: General;  Laterality: N/A;   No family history on file. Social History  Substance Use Topics  . Smoking status: Never Smoker   . Smokeless tobacco: Never Used  . Alcohol Use: No   OB History    No data available      Review of Systems  Constitutional: Negative for fever, chills and appetite change.  Respiratory: Negative for cough and shortness of breath.   Cardiovascular: Negative for chest pain, palpitations and leg swelling.  Gastrointestinal: Positive for abdominal pain (bilateral flank). Negative for nausea, vomiting, diarrhea, constipation, blood in stool, abdominal distention, anal bleeding and rectal pain.  Genitourinary: Positive for urgency. Negative for dysuria, frequency, hematuria, vaginal bleeding and vaginal discharge.  All other systems reviewed and are negative.   Allergies  Review of patient's allergies indicates no known allergies.  Home Medications   Prior to Admission medications  Medication Sig Start Date End Date Taking? Authorizing Provider  ALPRAZolam Prudy Feeler) 1 MG tablet Take 1 mg by mouth at bedtime as needed for anxiety or sleep.     Historical Provider, MD  amitriptyline (ELAVIL) 50 MG tablet Take 50 mg by mouth at bedtime.    Historical Provider, MD  amLODipine (NORVASC) 10 MG tablet Take 10 mg by mouth every morning.     Historical Provider, MD  clonazePAM (KLONOPIN) 1  MG tablet Take 1 mg by mouth 2 (two) times daily as needed for anxiety.    Historical Provider, MD  FLUoxetine (PROZAC) 20 MG capsule Take 20 mg by mouth daily.    Historical Provider, MD  HYDROcodone-acetaminophen (NORCO/VICODIN) 5-325 MG per tablet Take 1 tablet by mouth every 6 (six) hours as needed. 02/12/15   Angelina Ok, MD  lisinopril-hydrochlorothiazide (PRINZIDE,ZESTORETIC) 20-25 MG per tablet Take 1 tablet by mouth daily.    Historical Provider, MD  methylphenidate (RITALIN) 10 MG tablet Take 30 mg by mouth daily after breakfast.    Historical Provider, MD  pantoprazole (PROTONIX) 20 MG tablet Take 20 mg by mouth every morning.     Historical Provider, MD  sertraline (ZOLOFT) 100 MG tablet Take 100 mg by mouth daily.    Historical Provider, MD  tamsulosin (FLOMAX) 0.4 MG CAPS capsule Take 1 capsule (0.4 mg total) by mouth daily after supper. 04/24/13   Quita Skye, MD   BP 97/54 mmHg  Pulse 73  Temp(Src) 98.5 F (36.9 C)  Resp 18  SpO2 97%   Physical Exam  Constitutional: She is oriented to person, place, and time. She appears well-developed and well-nourished. No distress.  HENT:  Head: Normocephalic and atraumatic.  Mouth/Throat: Oropharynx is clear and moist.  Eyes: Conjunctivae are normal. Pupils are equal, round, and reactive to light.  Neck: Normal range of motion. Neck supple.  Cardiovascular: Normal rate, regular rhythm and intact distal pulses.   Pulmonary/Chest: Effort normal and breath sounds normal. No respiratory distress. She has no wheezes.  Abdominal: Soft. Bowel sounds are normal. She exhibits no distension. There is tenderness. There is no rebound.  Abd showing tenderness to palpation of bilateral flanks worse on right. No CVA tenderness.  Musculoskeletal: Normal range of motion.  Neurological: She is alert and oriented to person, place, and time.  Skin: Skin is warm and dry. She is not diaphoretic.  Nursing note and vitals reviewed.   ED Course   Procedures   Labs Review Labs Reviewed  URINALYSIS, ROUTINE W REFLEX MICROSCOPIC (NOT AT [facility]) - Abnormal; Notable for the following:    Leukocytes, UA TRACE (*)    All other components within normal limits  URINE MICROSCOPIC-ADD ON - Abnormal; Notable for the following:    Squamous Epithelial / LPF FEW (*)    Bacteria, UA FEW (*)    All other components within normal limits  CBC WITH DIFFERENTIAL/PLATELET - Abnormal; Notable for the following:    Hemoglobin 10.7 (*)    HCT 35.9 (*)    MCH 23.3 (*)    MCHC 29.8 (*)    All other components within normal limits  COMPREHENSIVE METABOLIC PANEL - Abnormal; Notable for the following:    Glucose, Bld 175 (*)    Creatinine, Ser 1.11 (*)    Calcium 11.0 (*)    Total Protein 6.2 (*)    AST 73 (*)    GFR calc non Af Amer 58 (*)    All other components within normal limits  I-STAT BETA  HCG BLOOD, ED ([facility], [facility], [facility] ONLY)   Imaging Review US Renal  02/12/2015   CLINICAL DATA:  BILATERAL flank pain question hydroureteronephrosis  EXAM: RENAL / URINARY TRACT ULTRASOUND COMPLETE  COMPARISON:  CT abdomen and pelvis 01/25/2015  FINDINGS: Right Kidney:  Length: 10.6 cm. Normal morphology without mass or hydronephrosis. No shadowing calcifications.  Left Kidney:  Length: 12.8 cm. Mild cortical thinning. Upper normal cortical echogenicity. No mass, hydronephrosis or shadowing calcification.  Bladder:  Partially distended, unremarkable.  IMPRESSION: No evidence of urinary tract mass or obstruction.   Electronically Signed   By: Ulyses Southward M.D.   On: 02/12/2015 20:53   I have personally reviewed and evaluated these images and lab results as part of my medical decision-making.   EKG Interpretation None      MDM  Ms. Koren is a 49 yo female w/ PMHx of HTN, GERD, schizophrenia, asthma, OSA, & nephrolithiasis presenting with bilateral flank pain. Onset several weeks ago. Of note patient seen in our ED on 01/28/15 at which time she underwent a CT abdomen  pelvis without contrast showing a 1.5 cm right adnexal cyst, multiple small nonobstructing bilateral renal calculi measuring up to 5 mm but no evidence of hydronephrosis. Patient discharged home on PO analgesia.  Morbidly obese middle-age female lying in the stretcher in NAD. Afebrile. Not tachycardic. Normotensive. Breathing well on RA and maintaining saturations without supplemental oxygen. Lungs CTAB. Abd showing tenderness to palpation of bilateral flanks worse on right. No CVA tenderness. CV RRR. Remainder of examination unremarkable.  Concern for UTI versus nephrolithiasis. Given lack of N/V & fever as well as no CVA tenderness or in for bronchitis at this time. Serum hCG undetectable. UA showing trace leukocytes and few bacteria but negative nitrite and few epithelial cells - only urinary symptom is urgency, will not treat empirically, urine culture sent. WBC 7.2. Hemoglobin 10.7 (chart review revealing baseline of 9.5-10). Creatinine 1.1 mildly elevated from baseline of 0.9. Renal US showing no evidence of hydroureteral nephrosis. Laboratory and imaging results were personally reviewed by myself and used in the medical decision making of this patient's treatment and disposition.  Patient given IV fluids and IV analgesia and is feeling much better. With patient about importance of following up with her urologist.  Pt discharged home in stable condition. Strict ED return precautions dicussed. Pt understands and agrees with the plan and has no further questions or concerns.   Pt care discussed with and followed by my attending, Dr. Thurmon Fair, MD Pager [phone]  Final diagnoses:  Nephrolithiasis    Angelina Ok, MD 02/12/15 9604  Elwin Mocha, MD 02/12/15 2332

## 2016-02-23 ENCOUNTER — Encounter (HOSPITAL_COMMUNITY): Payer: Self-pay | Admitting: Emergency Medicine

## 2016-02-23 ENCOUNTER — Emergency Department (HOSPITAL_COMMUNITY)
Admission: EM | Admit: 2016-02-23 | Discharge: 2016-02-24 | Disposition: A | Discharge status: Home / Self Care | Payer: Medicare Other | Attending: Emergency Medicine | Admitting: Emergency Medicine

## 2016-02-23 DIAGNOSIS — Z79899 Other long term (current) drug therapy: Secondary | ICD-10-CM | POA: Diagnosis not present

## 2016-02-23 DIAGNOSIS — J45909 Unspecified asthma, uncomplicated: Secondary | ICD-10-CM | POA: Insufficient documentation

## 2016-02-23 DIAGNOSIS — R739 Hyperglycemia, unspecified: Secondary | ICD-10-CM

## 2016-02-23 DIAGNOSIS — E1165 Type 2 diabetes mellitus with hyperglycemia: Secondary | ICD-10-CM | POA: Insufficient documentation

## 2016-02-23 DIAGNOSIS — I10 Essential (primary) hypertension: Secondary | ICD-10-CM | POA: Insufficient documentation

## 2016-02-23 HISTORY — DX: Type 2 diabetes mellitus without complications: E11.9

## 2016-02-23 LAB — URINE MICROSCOPIC-ADD ON
Bacteria, UA: NONE SEEN
RBC / HPF: NONE SEEN RBC/hpf (ref 0–5)

## 2016-02-23 LAB — CBC
HCT: 35.2 % — ABNORMAL LOW (ref 36.0–46.0)
HEMOGLOBIN: 10.2 g/dL — AB (ref 12.0–15.0)
MCH: 22.4 pg — AB (ref 26.0–34.0)
MCHC: 29 g/dL — AB (ref 30.0–36.0)
MCV: 77.2 fL — ABNORMAL LOW (ref 78.0–100.0)
PLATELETS: 260 10*3/uL (ref 150–400)
RBC: 4.56 MIL/uL (ref 3.87–5.11)
RDW: 14.7 % (ref 11.5–15.5)
WBC: 6.5 10*3/uL (ref 4.0–10.5)

## 2016-02-23 LAB — BASIC METABOLIC PANEL
ANION GAP: 10 (ref 5–15)
BUN: 9 mg/dL (ref 6–20)
CALCIUM: 12.2 mg/dL — AB (ref 8.9–10.3)
CO2: 21 mmol/L — AB (ref 22–32)
CREATININE: 0.94 mg/dL (ref 0.44–1.00)
Chloride: 102 mmol/L (ref 101–111)
GLUCOSE: 478 mg/dL — AB (ref 65–99)
Potassium: 3.5 mmol/L (ref 3.5–5.1)
Sodium: 133 mmol/L — ABNORMAL LOW (ref 135–145)

## 2016-02-23 LAB — URINALYSIS, ROUTINE W REFLEX MICROSCOPIC
BILIRUBIN URINE: NEGATIVE
Glucose, UA: 500 mg/dL — AB
HGB URINE DIPSTICK: NEGATIVE
Ketones, ur: NEGATIVE mg/dL
NITRITE: NEGATIVE
PROTEIN: NEGATIVE mg/dL
SPECIFIC GRAVITY, URINE: 1.017 (ref 1.005–1.030)
pH: 6.5 (ref 5.0–8.0)

## 2016-02-23 LAB — CBG MONITORING, ED
GLUCOSE-CAPILLARY: 492 mg/dL — AB (ref 65–99)
GLUCOSE-CAPILLARY: 357 mg/dL — AB (ref 65–99)
GLUCOSE-CAPILLARY: 309 mg/dL — AB (ref 65–99)

## 2016-02-23 MED ORDER — SODIUM CHLORIDE 0.9 % IV BOLUS (SEPSIS)
1000.0000 mL | Freq: Once | INTRAVENOUS | Status: AC
Start: 2016-02-23 — End: 2016-02-23
  Administered 2016-02-23: 1000 mL via INTRAVENOUS

## 2016-02-23 MED ORDER — INSULIN ASPART 100 UNIT/ML ~~LOC~~ SOLN
8.0000 [IU] | Freq: Once | SUBCUTANEOUS | Status: AC
  Administered 2016-02-23: 8 [IU] via SUBCUTANEOUS
  Filled 2016-02-23: qty 1

## 2016-02-23 MED ORDER — SODIUM CHLORIDE 0.9 % IV BOLUS (SEPSIS)
1000.0000 mL | Freq: Once | INTRAVENOUS | Status: AC
  Administered 2016-02-23: 1000 mL via INTRAVENOUS

## 2016-02-23 NOTE — ED Notes (Signed)
IV attempt X1 

## 2016-02-23 NOTE — ED Provider Notes (Signed)
11:36 PM Care assumed from [facility] Kir[facility], PA-C at shift change. Blood sugar has improved. VSS. No anion gap or ketones in the urine today. No concern for DKA complicating patient's hyperglycemia. Patient advised to restart her Metformin until she is able to follow up with her PCP. Return precautions discussed and provided. Patient discharged in stable condition with no unaddressed concerns.   Results for orders placed or performed during the hospital encounter of 02/23/16  Basic metabolic panel  Result Value Ref Range   Sodium 133 (L) 135 - 145 mmol/L   Potassium 3.5 3.5 - 5.1 mmol/L   Chloride 102 101 - 111 mmol/L   CO2 21 (L) 22 - 32 mmol/L   Glucose, Bld 478 (H) 65 - 99 mg/dL   BUN 9 6 - 20 mg/dL   Creatinine, Ser 1.610.94 0.44 - 1.00 mg/dL   Calcium 09.612.2 (H) 8.9 - 10.3 mg/dL   GFR calc non Af Amer >60 >60 mL/min   GFR calc Af Amer >60 >60 mL/min   Anion gap 10 5 - 15  CBC  Result Value Ref Range   WBC 6.5 4.0 - 10.5 K/uL   RBC 4.56 3.87 - 5.11 MIL/uL   Hemoglobin 10.2 (L) 12.0 - 15.0 g/dL   HCT 04.535.2 (L) 40.936.0 - 81.146.0 %   MCV 77.2 (L) 78.0 - 100.0 fL   MCH 22.4 (L) 26.0 - 34.0 pg   MCHC 29.0 (L) 30.0 - 36.0 g/dL   RDW 91.414.7 78.211.5 - 95.615.5 %   Platelets 260 150 - 400 K/uL  Urinalysis, Routine w reflex microscopic  Result Value Ref Range   Color, Urine YELLOW YELLOW   APPearance CLEAR CLEAR   Specific Gravity, Urine 1.017 1.005 - 1.030   pH 6.5 5.0 - 8.0   Glucose, UA 500 (A) NEGATIVE mg/dL   Hgb urine dipstick NEGATIVE NEGATIVE   Bilirubin Urine NEGATIVE NEGATIVE   Ketones, ur NEGATIVE NEGATIVE mg/dL   Protein, ur NEGATIVE NEGATIVE mg/dL   Nitrite NEGATIVE NEGATIVE   Leukocytes, UA SMALL (A) NEGATIVE  Urine microscopic-add on  Result Value Ref Range   Squamous Epithelial / LPF 0-5 (A) NONE SEEN   WBC, UA 6-30 0 - 5 WBC/hpf   RBC / HPF NONE SEEN 0 - 5 RBC/hpf   Bacteria, UA NONE SEEN NONE SEEN  CBG monitoring, ED  Result Value Ref Range   Glucose-Capillary 492 (H) 65 -  99 mg/dL  POC CBG, ED  Result Value Ref Range   Glucose-Capillary 357 (H) 65 - 99 mg/dL  POC CBG, ED  Result Value Ref Range   Glucose-Capillary 309 (H) 65 - 99 mg/dL     Antony MaduraKelly Zamier Eggebrecht, PA-C 21/30/8608/08/06 2338    Jacalyn LefevreJulie Haviland, MD 02/24/16 1627

## 2016-02-23 NOTE — Discharge Instructions (Signed)
Restart your dose of metformin which was prescribed to you by your primary care doctor. It is important that you are on this medication so your blood sugar levels don't go too high. We recommend a follow-up with your primary doctor to discuss any changes to your current medications. Try to modify your diet so that you are eating less processed foods. Continue to drink plenty of water. Return for any new or concerning symptoms.

## 2016-02-23 NOTE — ED Notes (Signed)
309 CBG

## 2016-02-23 NOTE — ED Provider Notes (Signed)
[facility] DEPT Provider Note   CSN: [id number] Arrival date & time: 02/23/16  1545     History   Chief Complaint Chief Complaint  Patient presents with  . Hyperglycemia    HPI Monique Holland is a 50 y.o. female.  HPI Monique Holland is a 50 y.o. female with history of asthma, diabetes, acid reflux, hypertension, schizophrenia, obstructive sleep apnea, presents to emergency department with complaint of headaches, polyuria, polydipsia, blood sugar. Patient states she took herself off of her metformin 2 weeks ago because it gives her diarrhea. She states she has been on metformin for a year. She reports she last checked her sugar on Friday, 4 days ago at which time it was 250. She states she went to her doctor today, there her blood sugar was 500 and she was sent here for further evaluation. At this time patient is only complaining of a headache. She states that she has not been eating healthy. She does not exercise.  Past Medical History:  Diagnosis Date  . Anxiety   . Asthma   . Depression   . Diabetes mellitus without complication (HCC)   . Frequency of urination   . GERD (gastroesophageal reflux disease)   . History of asthma    last episode yrs ago  . Hypertension   . OSA (obstructive sleep apnea)    pt had study done oct 2014--  schedule for cpap titrate after kidney stone surgery  . Schizophrenia (HCC)   . Ureteral calculi    BILATERAL  . Wears glasses     Patient Active Problem List   Diagnosis Date Noted  . Acute appendicitis 05/22/2014  . Ureteral calculus 05/02/2013    Past Surgical History:  Procedure Laterality Date  . CESAREAN SECTION  1991  &  2002   w/ bilateral tubal ligation in 2002  . CYSTOSCOPY WITH RETROGRADE PYELOGRAM, URETEROSCOPY AND STENT PLACEMENT Bilateral 05/02/2013   Procedure: CYSTOSCOPY WITH RETROGRADE PYELOGRAM, URETEROSCOPY ;  Surgeon: Valetta Fulleravid S Grapey, MD;  [facility];  Service: Urology;  Laterality: Bilateral;    . CYSTOSCOPY WITH STENT PLACEMENT Left 05/02/2013   Procedure: CYSTOSCOPY WITH STENT PLACEMENT;  Surgeon: Valetta Fulleravid S Grapey, MD;  [facility];  Service: Urology;  Laterality: Left;  . HOLMIUM LASER APPLICATION Bilateral 05/02/2013   Procedure: HOLMIUM LASER APPLICATION;  Surgeon: Valetta Fulleravid S Grapey, MD;  [facility];  Service: Urology;  Laterality: Bilateral;  . LAPAROSCOPIC APPENDECTOMY N/A 05/22/2014   Procedure: APPENDECTOMY LAPAROSCOPIC;  Surgeon: Glenna FellowsBenjamin Hoxworth, MD;  Location: [facility];  Service: General;  Laterality: N/A;  . TONSILLECTOMY  age 50    OB History    No data available       Home Medications    Prior to Admission medications   Medication Sig Start Date End Date Taking? Authorizing Provider  ALPRAZolam Prudy Feeler(XANAX) 1 MG tablet Take 1 mg by mouth at bedtime as needed for anxiety or sleep.     Historical Provider, MD  amitriptyline (ELAVIL) 50 MG tablet Take 50 mg by mouth at bedtime.    Historical Provider, MD  amLODipine (NORVASC) 10 MG tablet Take 10 mg by mouth every morning.     Historical Provider, MD  clonazePAM (KLONOPIN) 1 MG tablet Take 1 mg by mouth 2 (two) times daily as needed for anxiety.    Historical Provider, MD  FLUoxetine (PROZAC) 20 MG capsule Take 20 mg by mouth daily.    Historical Provider, MD  HYDROcodone-acetaminophen (NORCO/VICODIN)  5-325 MG per tablet Take 1 tablet by mouth every 6 (six) hours as needed. 02/12/15   Angelina Ok, MD  lisinopril-hydrochlorothiazide (PRINZIDE,ZESTORETIC) 20-25 MG per tablet Take 1 tablet by mouth daily.    Historical Provider, MD  methylphenidate (RITALIN) 10 MG tablet Take 30 mg by mouth daily after breakfast.    Historical Provider, MD  pantoprazole (PROTONIX) 20 MG tablet Take 20 mg by mouth every morning.     Historical Provider, MD  sertraline (ZOLOFT) 100 MG tablet Take 100 mg by mouth daily.    Historical Provider, MD  tamsulosin (FLOMAX) 0.4 MG CAPS capsule Take 1  capsule (0.4 mg total) by mouth daily after supper. 04/24/13   Quita Skye, MD    Family History No family history on file.  Social History Social History  Substance Use Topics  . Smoking status: Never Smoker  . Smokeless tobacco: Never Used  . Alcohol use No     Allergies   Review of patient's allergies indicates no known allergies.   Review of Systems Review of Systems  Constitutional: Negative for chills and fever.  Respiratory: Negative for cough, chest tightness and shortness of breath.   Cardiovascular: Negative for chest pain, palpitations and leg swelling.  Gastrointestinal: Negative for abdominal pain, diarrhea, nausea and vomiting.  Endocrine: Positive for polydipsia and polyuria.  Genitourinary: Negative for dysuria, flank pain and pelvic pain.  Musculoskeletal: Negative for arthralgias, myalgias, neck pain and neck stiffness.  Skin: Negative for rash.  Neurological: Positive for headaches. Negative for dizziness and weakness.  All other systems reviewed and are negative.    Physical Exam Updated Vital Signs BP 145/97   Pulse 119   Temp 98.4 F (36.9 C) (Oral)   Resp 22   SpO2 97%   Physical Exam  Constitutional: She is oriented to person, place, and time. She appears well-developed and well-nourished.  obese  HENT:  Head: Normocephalic.  Eyes: Conjunctivae are normal.  Neck: Neck supple.  Cardiovascular: Regular rhythm and normal heart sounds.   Tachycardic  Pulmonary/Chest: Effort normal. No respiratory distress. She has wheezes. She has no rales.  expiratory wheezing bilaterally  Abdominal: Soft. Bowel sounds are normal. She exhibits no distension. There is no tenderness. There is no rebound.  Musculoskeletal: She exhibits no edema.  Neurological: She is alert and oriented to person, place, and time.  Skin: Skin is warm and dry.  Psychiatric: She has a normal mood and affect. Her behavior is normal.  Nursing note and vitals reviewed.    ED  Treatments / Results  Labs (all labs ordered are listed, but only abnormal results are displayed) Labs Reviewed  BASIC METABOLIC PANEL - Abnormal; Notable for the following:       Result Value   Sodium 133 (*)    CO2 21 (*)    Glucose, Bld 478 (*)    Calcium 12.2 (*)    All other components within normal limits  CBC - Abnormal; Notable for the following:    Hemoglobin 10.2 (*)    HCT 35.2 (*)    MCV 77.2 (*)    MCH 22.4 (*)    MCHC 29.0 (*)    All other components within normal limits  CBG MONITORING, ED - Abnormal; Notable for the following:    Glucose-Capillary 492 (*)    All other components within normal limits  URINALYSIS, ROUTINE W REFLEX MICROSCOPIC (NOT AT [facility])    EKG  EKG Interpretation None       Radiology No  results found.  Procedures Procedures (including critical care time)  Medications Ordered in ED Medications  sodium chloride 0.9 % bolus 1,000 mL (not administered)  sodium chloride 0.9 % bolus 1,000 mL (not administered)  insulin aspart (novoLOG) injection 8 Units (not administered)     Initial Impression / Assessment and Plan / ED Course  I have reviewed the triage vital signs and the nursing notes.  Pertinent labs & imaging results that were available during my care of the patient were reviewed by me and considered in my medical decision making (see chart for details).  Clinical Course  Patient emergency department from her primary care doctor's office with blood sugar 500. Her only complaint right now is headache. She reports polyuria and polydipsia, headaches over the last several days she has been off of her metformin for 2 weeks. She is in no acute distress. She is mildly tachycardic. Labs obtained by triage nurse, show blood sugar of 478. Anion gap is 10. Urinalysis pending. Will give 2 L of saline and 8 units of insulin subcutaneous and will reassess.   Patient signed out at shift change pending IV fluids and reassessment of her blood  sugar. Patient will need to go home on another diabetic agent. She will need to follow-up closely with her primary care doctor.  Vitals:   02/23/16 2245 02/23/16 2300 02/23/16 2315 02/23/16 2339  BP: 123/74 124/73 114/76 133/68  Pulse: 104 105 102 97  Resp:    16  Temp:    97.3 F (36.3 C)  TempSrc:      SpO2: 91% 92% 97% 98%    Final Clinical Impressions(s) / ED Diagnoses   Final diagnoses:  Hyperglycemia    New Prescriptions New Prescriptions   No medications on file     Jaynie Crumble, PA-C 02/24/16 1605    Jacalyn Lefevre, MD 02/24/16 2201

## 2016-02-23 NOTE — ED Triage Notes (Signed)
Pt c/o dizziness and increased thirst and blurry vision, went to her PCP and her sugar was over 500, and sent here for further evaluation. Denies pain. AAOX4, in NAD.

## 2016-02-24 NOTE — ED Notes (Signed)
Pt d/c home  

## 2016-02-24 NOTE — ED Notes (Signed)
Electronic signature not operating in room E48

## 2016-03-01 ENCOUNTER — Encounter (HOSPITAL_COMMUNITY): Payer: Self-pay | Admitting: Emergency Medicine

## 2016-03-01 ENCOUNTER — Emergency Department (HOSPITAL_COMMUNITY)
Admission: EM | Admit: 2016-03-01 | Discharge: 2016-03-01 | Disposition: A | Discharge status: Home / Self Care | Payer: Medicare Other | Attending: Emergency Medicine | Admitting: Emergency Medicine

## 2016-03-01 DIAGNOSIS — Z7984 Long term (current) use of oral hypoglycemic drugs: Secondary | ICD-10-CM | POA: Diagnosis not present

## 2016-03-01 DIAGNOSIS — Z79899 Other long term (current) drug therapy: Secondary | ICD-10-CM | POA: Insufficient documentation

## 2016-03-01 DIAGNOSIS — E1165 Type 2 diabetes mellitus with hyperglycemia: Secondary | ICD-10-CM | POA: Diagnosis not present

## 2016-03-01 DIAGNOSIS — I10 Essential (primary) hypertension: Secondary | ICD-10-CM | POA: Insufficient documentation

## 2016-03-01 DIAGNOSIS — N390 Urinary tract infection, site not specified: Secondary | ICD-10-CM

## 2016-03-01 DIAGNOSIS — J45909 Unspecified asthma, uncomplicated: Secondary | ICD-10-CM | POA: Insufficient documentation

## 2016-03-01 DIAGNOSIS — R739 Hyperglycemia, unspecified: Secondary | ICD-10-CM

## 2016-03-01 LAB — URINALYSIS, ROUTINE W REFLEX MICROSCOPIC
Bilirubin Urine: NEGATIVE
Glucose, UA: >1000 mg/dL — AB
Hgb urine dipstick: NEGATIVE
Ketones, ur: NEGATIVE mg/dL
NITRITE: NEGATIVE
PH: 7.5 (ref 5.0–8.0)
Protein, ur: NEGATIVE mg/dL
SPECIFIC GRAVITY, URINE: 1.011 (ref 1.005–1.030)

## 2016-03-01 LAB — CBC
HEMATOCRIT: 33.9 % — AB (ref 36.0–46.0)
HEMOGLOBIN: 10.2 g/dL — AB (ref 12.0–15.0)
MCH: 22.3 pg — AB (ref 26.0–34.0)
MCHC: 30.1 g/dL (ref 30.0–36.0)
MCV: 74 fL — ABNORMAL LOW (ref 78.0–100.0)
Platelets: 290 10*3/uL (ref 150–400)
RBC: 4.58 MIL/uL (ref 3.87–5.11)
RDW: 14.4 % (ref 11.5–15.5)
WBC: 7.4 10*3/uL (ref 4.0–10.5)

## 2016-03-01 LAB — CBG MONITORING, ED
GLUCOSE-CAPILLARY: 465 mg/dL — AB (ref 65–99)
GLUCOSE-CAPILLARY: 489 mg/dL — AB (ref 65–99)
Glucose-Capillary: 344 mg/dL — ABNORMAL HIGH (ref 65–99)
Glucose-Capillary: 321 mg/dL — ABNORMAL HIGH (ref 65–99)

## 2016-03-01 LAB — BASIC METABOLIC PANEL
ANION GAP: 9 (ref 5–15)
BUN: 8 mg/dL (ref 6–20)
CALCIUM: 12.7 mg/dL — AB (ref 8.9–10.3)
CHLORIDE: 101 mmol/L (ref 101–111)
CO2: 21 mmol/L — ABNORMAL LOW (ref 22–32)
Creatinine, Ser: 0.99 mg/dL (ref 0.44–1.00)
GFR calc Af Amer: >60 mL/min (ref >60)
Glucose, Bld: 528 mg/dL (ref 65–99)
POTASSIUM: 3.7 mmol/L (ref 3.5–5.1)
SODIUM: 131 mmol/L — AB (ref 135–145)

## 2016-03-01 LAB — URINE MICROSCOPIC-ADD ON: RBC / HPF: NONE SEEN RBC/hpf (ref 0–5)

## 2016-03-01 MED ORDER — LISINOPRIL 20 MG PO TABS
20.0000 mg | ORAL_TABLET | Freq: Every day | ORAL | Status: DC
  Administered 2016-03-01: 20 mg via ORAL
  Filled 2016-03-01: qty 1

## 2016-03-01 MED ORDER — LISINOPRIL-HYDROCHLOROTHIAZIDE 20-25 MG PO TABS: 1.0000 | ORAL_TABLET | Freq: Every day | ORAL | Status: DC

## 2016-03-01 MED ORDER — INSULIN ASPART 100 UNIT/ML ~~LOC~~ SOLN
5.0000 [IU] | Freq: Once | SUBCUTANEOUS | Status: AC
  Administered 2016-03-01: 5 [IU] via INTRAVENOUS
  Filled 2016-03-01: qty 1

## 2016-03-01 MED ORDER — INSULIN ASPART 100 UNIT/ML ~~LOC~~ SOLN
10.0000 [IU] | Freq: Once | SUBCUTANEOUS | Status: AC
  Administered 2016-03-01: 10 [IU] via INTRAVENOUS
  Filled 2016-03-01: qty 1

## 2016-03-01 MED ORDER — SODIUM CHLORIDE 0.9 % IV BOLUS (SEPSIS)
500.0000 mL | Freq: Once | INTRAVENOUS | Status: AC
  Administered 2016-03-01: 500 mL via INTRAVENOUS

## 2016-03-01 MED ORDER — SODIUM CHLORIDE 0.9 % IV BOLUS (SEPSIS)
1000.0000 mL | Freq: Once | INTRAVENOUS | Status: AC
  Administered 2016-03-01: 1000 mL via INTRAVENOUS

## 2016-03-01 MED ORDER — CEPHALEXIN 500 MG PO CAPS: 500.0000 mg | ORAL_CAPSULE | Freq: Four times a day (QID) | ORAL | 0 refills | Status: DC

## 2016-03-01 MED ORDER — AMLODIPINE BESYLATE 5 MG PO TABS
10.0000 mg | ORAL_TABLET | Freq: Once | ORAL | Status: AC
  Administered 2016-03-01: 10 mg via ORAL
  Filled 2016-03-01: qty 2

## 2016-03-01 MED ORDER — CEPHALEXIN 250 MG PO CAPS
500.0000 mg | ORAL_CAPSULE | Freq: Once | ORAL | Status: AC
  Administered 2016-03-01: 500 mg via ORAL
  Filled 2016-03-01: qty 2

## 2016-03-01 MED ORDER — HYDROCHLOROTHIAZIDE 25 MG PO TABS
25.0000 mg | ORAL_TABLET | Freq: Every day | ORAL | Status: DC
  Administered 2016-03-01: 25 mg via ORAL
  Filled 2016-03-01: qty 1

## 2016-03-01 NOTE — ED Triage Notes (Signed)
Per pt "my blood sugar is 552, my blood pressure is 207/103." pt states she's been taking her Bp and diabetic medicines like she's supposed to. Pt endorses feeling tired. "ive been urinating on myself every 10-15 minutes".

## 2016-03-01 NOTE — ED Notes (Signed)
Pt's CBG result was 489. Informed Jessica - RN.

## 2016-03-01 NOTE — ED Notes (Signed)
Pt informed of wait times. 

## 2016-03-01 NOTE — ED Notes (Addendum)
Pt urinated on herself and was asked if she wanted to be cleaned up. Pt stated that she would "wait until she got back to a room".

## 2016-03-01 NOTE — ED Notes (Signed)
Pt provided with d/c instructions at this time.  Pt verbalizes understanding of d/c instructions as well as follow up procedure after d/c. Pt provided with RX for keflex.  Pt verbalizes understanding of RX directions at this time.  Pt in no apparent distress at this time. Pt assisted out of the ED via [place].

## 2016-03-01 NOTE — ED Notes (Signed)
Pt unable to void at this time, 

## 2016-03-01 NOTE — Discharge Instructions (Signed)
Continue taking metformin. Take antibiotics as prescribed. Follow up with your primary care provider tomorrow for re-evaluation. Return to the ED if you experience fevers, abdominal pain, loss of consciousness, difficulty breathing.

## 2016-03-01 NOTE — ED Provider Notes (Signed)
[facility]-EMERGENCY DEPT Provider Note   CSN: [id number] Arrival date & time: 03/01/16  1204     History   Chief Complaint Chief Complaint  Patient presents with  . Hyperglycemia    HPI Monique Holland is a 50 y.o. female with a past medical history of DM, obesity, HTN, schizophrenia who presents to the ED today complaining of hyperglycemia. Patient states that she was seen in her memory care office prior to arrival and was told that her blood sugar was over 500 that her blood pressure was elevated Center to the ED for further evaluation. Patient states that one week ago she was in the ED for similar problem. She was not in DKA at that time. They increased her dose of metformin to 1000 mg twice a day which she reports compliance with. Patient has not taken her blood pressure medication since yesterday. She reports polyuria and polydipsia as well as foul-smelling urine. She denies any abdominal pain, chest pain, fevers. Patient reports 2 episodes of nonbloody, nonbilious emesis 4 days ago but no nausea or vomiting since. Denies diarrhea, melena, hematochezia. Patient states she is unable to check her blood sugar at home because her glucose monitor is broken.  HPI  Past Medical History:  Diagnosis Date  . Anxiety   . Asthma   . Depression   . Diabetes mellitus without complication (HCC)   . Frequency of urination   . GERD (gastroesophageal reflux disease)   . History of asthma    last episode yrs ago  . Hypertension   . OSA (obstructive sleep apnea)    pt had study done oct 2014--  schedule for cpap titrate after kidney stone surgery  . Schizophrenia (HCC)   . Ureteral calculi    BILATERAL  . Wears glasses     Patient Active Problem List   Diagnosis Date Noted  . Acute appendicitis 05/22/2014  . Ureteral calculus 05/02/2013    Past Surgical History:  Procedure Laterality Date  . CESAREAN SECTION  1991  &  2002   w/ bilateral tubal ligation in 2002  . CYSTOSCOPY WITH RETROGRADE  PYELOGRAM, URETEROSCOPY AND STENT PLACEMENT Bilateral 05/02/2013   Procedure: CYSTOSCOPY WITH RETROGRADE PYELOGRAM, URETEROSCOPY ;  Surgeon: Valetta Fuller, MD;  Location: [facility];  Service: Urology;  Laterality: Bilateral;  . CYSTOSCOPY WITH STENT PLACEMENT Left 05/02/2013   Procedure: CYSTOSCOPY WITH STENT PLACEMENT;  Surgeon: Valetta Fuller, MD;  Location: [facility];  Service: Urology;  Laterality: Left;  . HOLMIUM LASER APPLICATION Bilateral 05/02/2013   Procedure: HOLMIUM LASER APPLICATION;  Surgeon: Valetta Fuller, MD;  Location: [facility];  Service: Urology;  Laterality: Bilateral;  . LAPAROSCOPIC APPENDECTOMY N/A 05/22/2014   Procedure: APPENDECTOMY LAPAROSCOPIC;  Surgeon: Glenna Fellows, MD;  Location: [facility];  Service: General;  Laterality: N/A;  . TONSILLECTOMY  age 45    OB History    No data available       Home Medications    Prior to Admission medications   Medication Sig Start Date End Date Taking? Authorizing Provider  amitriptyline (ELAVIL) 50 MG tablet Take 50 mg by mouth at bedtime.    Historical Provider, MD  amLODipine (NORVASC) 10 MG tablet Take 10 mg by mouth every morning.     Historical Provider, MD  clonazePAM (KLONOPIN) 1 MG tablet Take 1 mg by mouth 2 (two) times daily as needed for anxiety.    Historical Provider, MD  FLUoxetine (PROZAC) 20 MG capsule Take  20 mg by mouth daily.    Historical Provider, MD  HYDROcodone-acetaminophen (NORCO/VICODIN) 5-325 MG per tablet Take 1 tablet by mouth every 6 (six) hours as needed. Patient not taking: Reported on 02/23/2016 02/12/15   Angelina Okyan Franasiak, MD  lisinopril-hydrochlorothiazide (PRINZIDE,ZESTORETIC) 20-25 MG per tablet Take 1 tablet by mouth daily.    Historical Provider, MD  methylphenidate (RITALIN) 10 MG tablet Take 30 mg by mouth daily after breakfast.    Historical Provider, MD  oxyCODONE (OXYCONTIN) 10 mg 12 hr tablet Take 10 mg by mouth daily as needed.     Historical Provider, MD  pantoprazole (PROTONIX) 20 MG tablet Take 20 mg by mouth every morning.     Historical Provider, MD  sertraline (ZOLOFT) 100 MG tablet Take 100 mg by mouth daily.    Historical Provider, MD  tamsulosin (FLOMAX) 0.4 MG CAPS capsule Take 1 capsule (0.4 mg total) by mouth daily after supper. Patient not taking: Reported on 02/23/2016 04/24/13   Quita SkyeMichael Ghim, MD    Family History No family history on file.  Social History Social History  Substance Use Topics  . Smoking status: Never Smoker  . Smokeless tobacco: Never Used  . Alcohol use No     Allergies   Review of patient's allergies indicates no known allergies.   Review of Systems Review of Systems   Physical Exam Updated Vital Signs BP (!) 152/106   Pulse 102   Temp 98.3 F (36.8 C) (Oral)   Resp 17   SpO2 100%   Physical Exam  Constitutional: She is oriented to person, place, and time. No distress.  Obese  HENT:  Head: Normocephalic and atraumatic.  Mouth/Throat: No oropharyngeal exudate.  Eyes: Conjunctivae and EOM are normal. Pupils are equal, round, and reactive to light. Right eye exhibits no discharge. Left eye exhibits no discharge. No scleral icterus.  Cardiovascular: Normal rate, regular rhythm, normal heart sounds and intact distal pulses.  Exam reveals no gallop and no friction rub.   No murmur heard. Pulmonary/Chest: Effort normal and breath sounds normal. No respiratory distress. She has no wheezes. She has no rales. She exhibits no tenderness.  Abdominal: Soft. Bowel sounds are normal. She exhibits no distension. There is no tenderness. There is no guarding.  Musculoskeletal: Normal range of motion. She exhibits no edema.  Neurological: She is alert and oriented to person, place, and time.  Skin: Skin is warm and dry. No rash noted. She is not diaphoretic. No erythema. No pallor.  Psychiatric: She has a normal mood and affect. Her behavior is normal.  Nursing note and vitals  reviewed.    ED Treatments / Results  Labs (all labs ordered are listed, but only abnormal results are displayed) Labs Reviewed  BASIC METABOLIC PANEL - Abnormal; Notable for the following:       Result Value   Sodium 131 (*)    CO2 21 (*)    Glucose, Bld 528 (*)    Calcium 12.7 (*)    All other components within normal limits  CBC - Abnormal; Notable for the following:    Hemoglobin 10.2 (*)    HCT 33.9 (*)    MCV 74.0 (*)    MCH 22.3 (*)    All other components within normal limits  CBG MONITORING, ED - Abnormal; Notable for the following:    Glucose-Capillary 465 (*)    All other components within normal limits  CBG MONITORING, ED - Abnormal; Notable for the following:    Glucose-Capillary 489 (*)  All other components within normal limits  URINALYSIS, ROUTINE W REFLEX MICROSCOPIC (NOT AT [facility])    EKG  EKG Interpretation None       Radiology No results found.  Procedures Procedures (including critical care time)  Medications Ordered in ED Medications  sodium chloride 0.9 % bolus 1,000 mL (not administered)  insulin aspart (novoLOG) injection 10 Units (not administered)  amLODipine (NORVASC) tablet 10 mg (not administered)  lisinopril-hydrochlorothiazide (PRINZIDE,ZESTORETIC) 20-25 MG per tablet 1 tablet (not administered)     Initial Impression / Assessment and Plan / ED Course  I have reviewed the triage vital signs and the nursing notes.  Pertinent labs & imaging results that were available during my care of the patient were reviewed by me and considered in my medical decision making (see chart for details).  Clinical Course    50 year old female with history of obesity, DM, schizophrenia presents to the ED today with complaint of hypertension and hyperglycemia. Patient was at her PCP office earlier today and was noted to have elevated blood pressure and glucose over 500. On presentation to ED, BP elevated to 160/100. Blood sugar is 528. No sign of  DKA. No ketonuria. No anion gap. Patient given 15 units of IV insulin, 1.5 L of fluids. UA also appears to be infected. Patient endorses foul-smelling urine. This may potentially be adding to hyperglycemia. However, patient was here one week ago with hyperglycemia as well but at that time she was not taking her metformin. Patient states that she has been taking her metformin for the past 2 weeks since her discharge from the ED. Suspect patient will need medication adjustment by PCP. Will treat UTI with antibiotics and sent for culture. Patient will follow-up with her PCP tomorrow. Blood pressure now 150/84, she was given her home dose of BP medications as she did not take them today.Return precautions outlined in patient discharge instructions.   Case discussed with Dr. Deretha Emory who agrees with treatment plan.  Final Clinical Impressions(s) / ED Diagnoses   Final diagnoses:  Hyperglycemia  Urinary tract infection without hematuria, site unspecified    New Prescriptions Discharge Medication List as of 03/01/2016 10:51 PM    START taking these medications   Details  cephALEXin (KEFLEX) 500 MG capsule Take 1 capsule (500 mg total) by mouth 4 (four) times daily., Starting Tue 03/01/2016, Print         [employer], PA-C 03/02/16 2228    Vanetta Mulders, MD 03/04/16 [phone]

## 2016-03-03 LAB — URINE CULTURE

## 2016-03-10 DIAGNOSIS — F329 Major depressive disorder, single episode, unspecified: Secondary | ICD-10-CM

## 2016-03-10 DIAGNOSIS — E1101 Type 2 diabetes mellitus with hyperosmolarity with coma: Secondary | ICD-10-CM

## 2016-03-10 DIAGNOSIS — R112 Nausea with vomiting, unspecified: Secondary | ICD-10-CM | POA: Diagnosis not present

## 2016-03-10 DIAGNOSIS — E1165 Type 2 diabetes mellitus with hyperglycemia: Secondary | ICD-10-CM

## 2016-03-10 DIAGNOSIS — N900 Mild vulvar dysplasia: Secondary | ICD-10-CM | POA: Diagnosis not present

## 2016-03-11 DIAGNOSIS — E1165 Type 2 diabetes mellitus with hyperglycemia: Secondary | ICD-10-CM | POA: Diagnosis not present

## 2016-03-11 DIAGNOSIS — N900 Mild vulvar dysplasia: Secondary | ICD-10-CM | POA: Diagnosis not present

## 2016-03-11 DIAGNOSIS — E1101 Type 2 diabetes mellitus with hyperosmolarity with coma: Secondary | ICD-10-CM | POA: Diagnosis not present

## 2016-03-11 DIAGNOSIS — R112 Nausea with vomiting, unspecified: Secondary | ICD-10-CM | POA: Diagnosis not present

## 2016-03-12 DIAGNOSIS — F329 Major depressive disorder, single episode, unspecified: Secondary | ICD-10-CM | POA: Diagnosis not present

## 2016-03-12 DIAGNOSIS — N900 Mild vulvar dysplasia: Secondary | ICD-10-CM | POA: Diagnosis not present

## 2016-03-12 DIAGNOSIS — E1101 Type 2 diabetes mellitus with hyperosmolarity with coma: Secondary | ICD-10-CM

## 2016-03-12 DIAGNOSIS — R112 Nausea with vomiting, unspecified: Secondary | ICD-10-CM

## 2016-03-12 DIAGNOSIS — E1165 Type 2 diabetes mellitus with hyperglycemia: Secondary | ICD-10-CM

## 2016-03-13 DIAGNOSIS — F329 Major depressive disorder, single episode, unspecified: Secondary | ICD-10-CM | POA: Diagnosis not present

## 2016-03-13 DIAGNOSIS — E1101 Type 2 diabetes mellitus with hyperosmolarity with coma: Secondary | ICD-10-CM | POA: Diagnosis not present

## 2016-03-13 DIAGNOSIS — N900 Mild vulvar dysplasia: Secondary | ICD-10-CM | POA: Diagnosis not present

## 2016-03-13 DIAGNOSIS — E1165 Type 2 diabetes mellitus with hyperglycemia: Secondary | ICD-10-CM | POA: Diagnosis not present

## 2016-03-13 DIAGNOSIS — R112 Nausea with vomiting, unspecified: Secondary | ICD-10-CM | POA: Diagnosis not present

## 2016-03-14 DIAGNOSIS — E1165 Type 2 diabetes mellitus with hyperglycemia: Secondary | ICD-10-CM | POA: Diagnosis not present

## 2016-03-14 DIAGNOSIS — F329 Major depressive disorder, single episode, unspecified: Secondary | ICD-10-CM | POA: Diagnosis not present

## 2016-03-14 DIAGNOSIS — R112 Nausea with vomiting, unspecified: Secondary | ICD-10-CM | POA: Diagnosis not present

## 2016-03-14 DIAGNOSIS — E1101 Type 2 diabetes mellitus with hyperosmolarity with coma: Secondary | ICD-10-CM | POA: Diagnosis not present

## 2016-03-14 DIAGNOSIS — N900 Mild vulvar dysplasia: Secondary | ICD-10-CM | POA: Diagnosis not present

## 2016-03-15 DIAGNOSIS — R112 Nausea with vomiting, unspecified: Secondary | ICD-10-CM | POA: Diagnosis not present

## 2016-03-15 DIAGNOSIS — E1165 Type 2 diabetes mellitus with hyperglycemia: Secondary | ICD-10-CM | POA: Diagnosis not present

## 2016-03-15 DIAGNOSIS — F329 Major depressive disorder, single episode, unspecified: Secondary | ICD-10-CM | POA: Diagnosis not present

## 2016-03-15 DIAGNOSIS — N900 Mild vulvar dysplasia: Secondary | ICD-10-CM | POA: Diagnosis not present

## 2016-03-15 DIAGNOSIS — E1101 Type 2 diabetes mellitus with hyperosmolarity with coma: Secondary | ICD-10-CM | POA: Diagnosis not present

## 2016-03-16 DIAGNOSIS — R109 Unspecified abdominal pain: Secondary | ICD-10-CM | POA: Diagnosis not present

## 2016-03-16 DIAGNOSIS — E1165 Type 2 diabetes mellitus with hyperglycemia: Secondary | ICD-10-CM | POA: Diagnosis not present

## 2016-03-16 DIAGNOSIS — N390 Urinary tract infection, site not specified: Secondary | ICD-10-CM

## 2016-03-16 DIAGNOSIS — R112 Nausea with vomiting, unspecified: Secondary | ICD-10-CM

## 2016-03-16 DIAGNOSIS — K760 Fatty (change of) liver, not elsewhere classified: Secondary | ICD-10-CM

## 2017-07-23 ENCOUNTER — Emergency Department (HOSPITAL_COMMUNITY): Payer: Medicare Other

## 2017-07-23 ENCOUNTER — Emergency Department (HOSPITAL_COMMUNITY)
Admission: EM | Admit: 2017-07-23 | Discharge: 2017-07-23 | Disposition: A | Discharge status: Home / Self Care | Payer: Medicare Other | Attending: Emergency Medicine | Admitting: Emergency Medicine

## 2017-07-23 ENCOUNTER — Other Ambulatory Visit: Payer: Self-pay

## 2017-07-23 DIAGNOSIS — J45909 Unspecified asthma, uncomplicated: Secondary | ICD-10-CM | POA: Diagnosis not present

## 2017-07-23 DIAGNOSIS — E119 Type 2 diabetes mellitus without complications: Secondary | ICD-10-CM | POA: Insufficient documentation

## 2017-07-23 DIAGNOSIS — Z79899 Other long term (current) drug therapy: Secondary | ICD-10-CM | POA: Insufficient documentation

## 2017-07-23 DIAGNOSIS — R091 Pleurisy: Secondary | ICD-10-CM | POA: Diagnosis not present

## 2017-07-23 DIAGNOSIS — R072 Precordial pain: Secondary | ICD-10-CM | POA: Insufficient documentation

## 2017-07-23 DIAGNOSIS — R079 Chest pain, unspecified: Secondary | ICD-10-CM | POA: Diagnosis present

## 2017-07-23 DIAGNOSIS — I10 Essential (primary) hypertension: Secondary | ICD-10-CM | POA: Insufficient documentation

## 2017-07-23 DIAGNOSIS — R062 Wheezing: Secondary | ICD-10-CM

## 2017-07-23 LAB — BASIC METABOLIC PANEL
ANION GAP: 9 (ref 5–15)
BUN: 11 mg/dL (ref 6–20)
CHLORIDE: 105 mmol/L (ref 101–111)
CO2: 24 mmol/L (ref 22–32)
Calcium: 10.9 mg/dL — ABNORMAL HIGH (ref 8.9–10.3)
Creatinine, Ser: 0.95 mg/dL (ref 0.44–1.00)
GFR calc non Af Amer: >60 mL/min (ref >60)
Glucose, Bld: 261 mg/dL — ABNORMAL HIGH (ref 65–99)
POTASSIUM: 3.9 mmol/L (ref 3.5–5.1)
Sodium: 138 mmol/L (ref 135–145)

## 2017-07-23 LAB — I-STAT BETA HCG BLOOD, ED (MC, WL, AP ONLY)

## 2017-07-23 LAB — CBC
HEMATOCRIT: 32.7 % — AB (ref 36.0–46.0)
HEMOGLOBIN: 9.8 g/dL — AB (ref 12.0–15.0)
MCH: 23.4 pg — AB (ref 26.0–34.0)
MCHC: 30 g/dL (ref 30.0–36.0)
MCV: 78 fL (ref 78.0–100.0)
Platelets: 206 10*3/uL (ref 150–400)
RBC: 4.19 MIL/uL (ref 3.87–5.11)
RDW: 13.8 % (ref 11.5–15.5)
WBC: 6.1 10*3/uL (ref 4.0–10.5)

## 2017-07-23 LAB — I-STAT TROPONIN, ED: Troponin i, poc: 0 ng/mL (ref 0.00–0.08)

## 2017-07-23 IMAGING — DX DG CHEST 2V
2 series · 2 of 2 positions shown · non-contrast
Comparison: Radiograph [DATE]

CLINICAL DATA: Left-sided chest pain for 2 days. Shortness of
breath.

EXAM:
CHEST  2 VIEW

[chest pa]
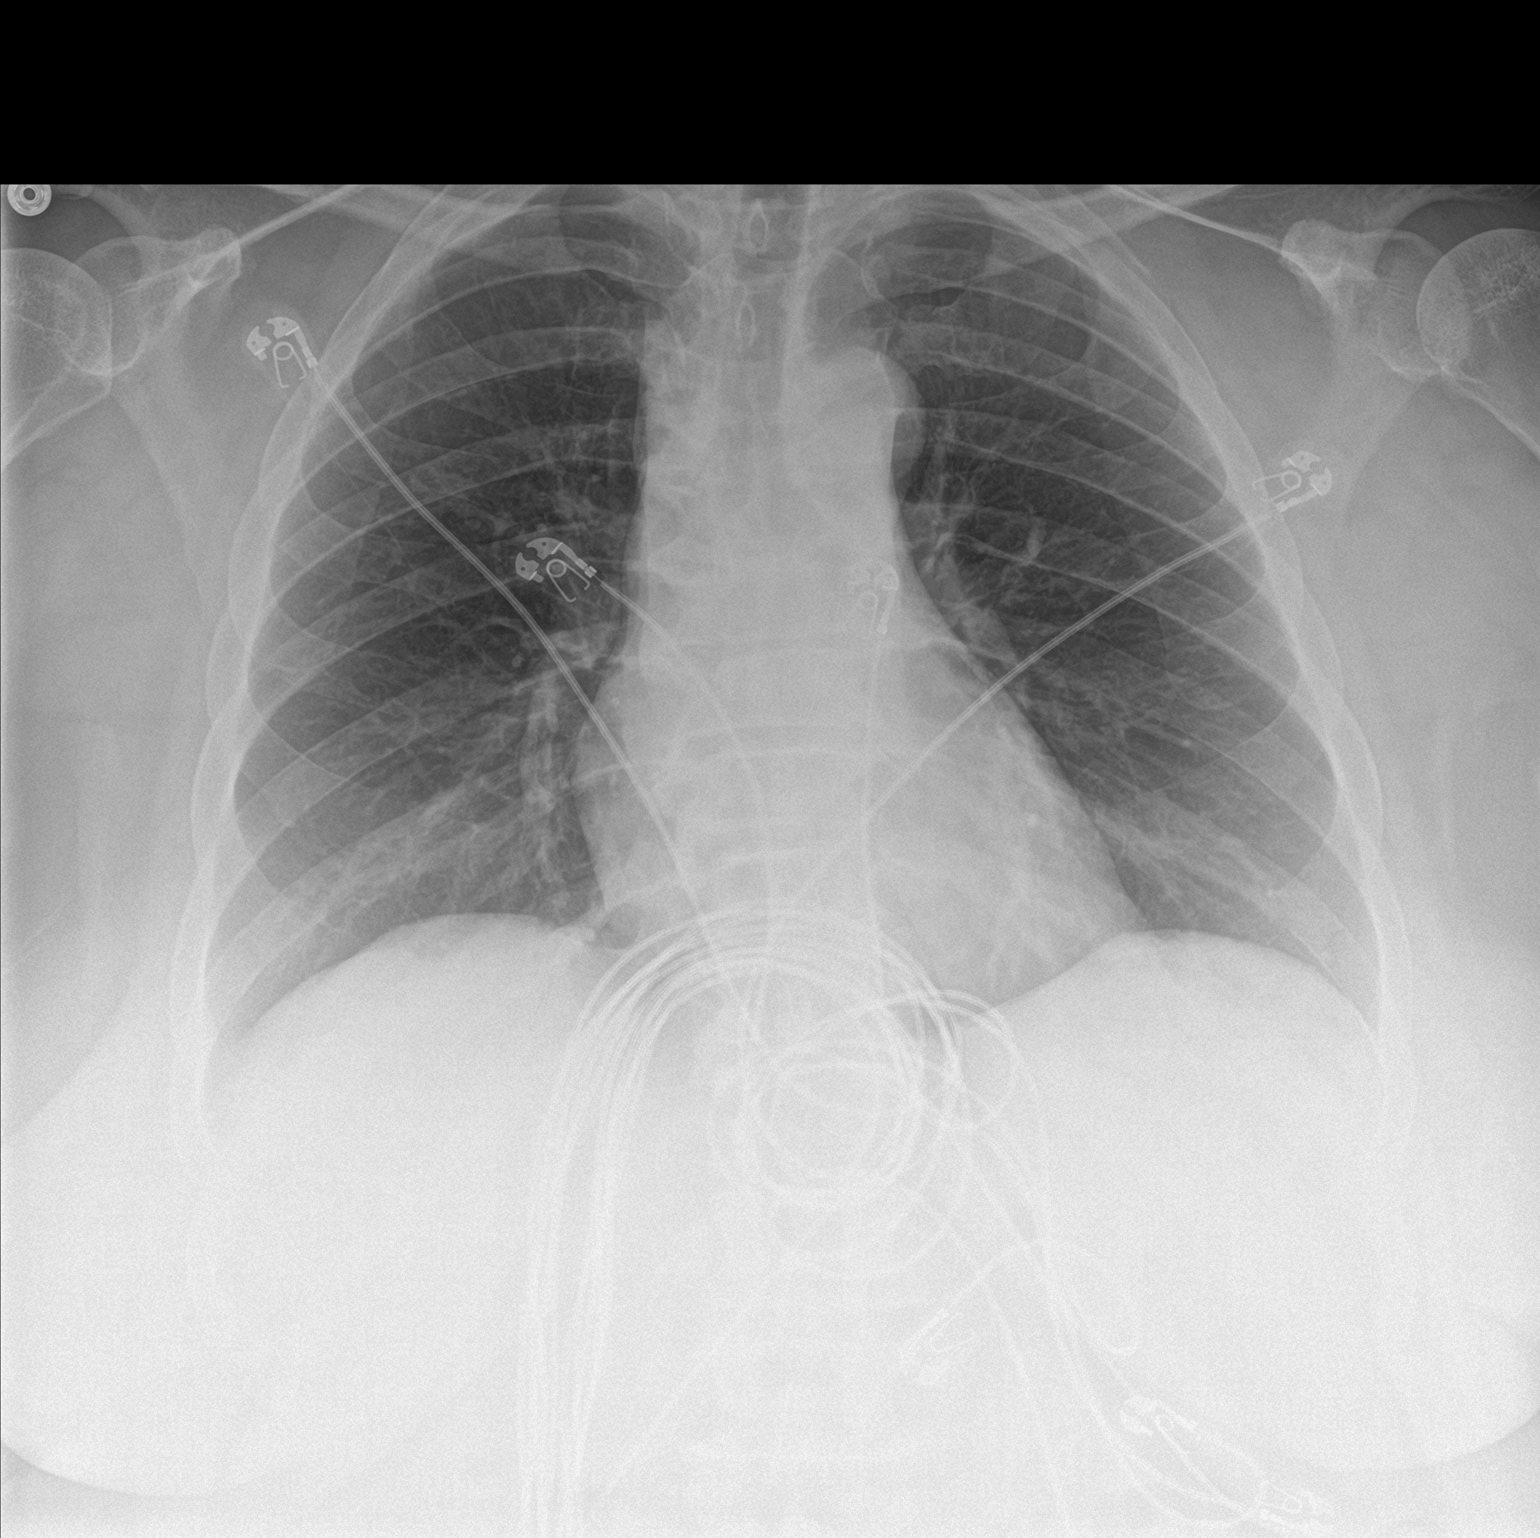

[chest lat]
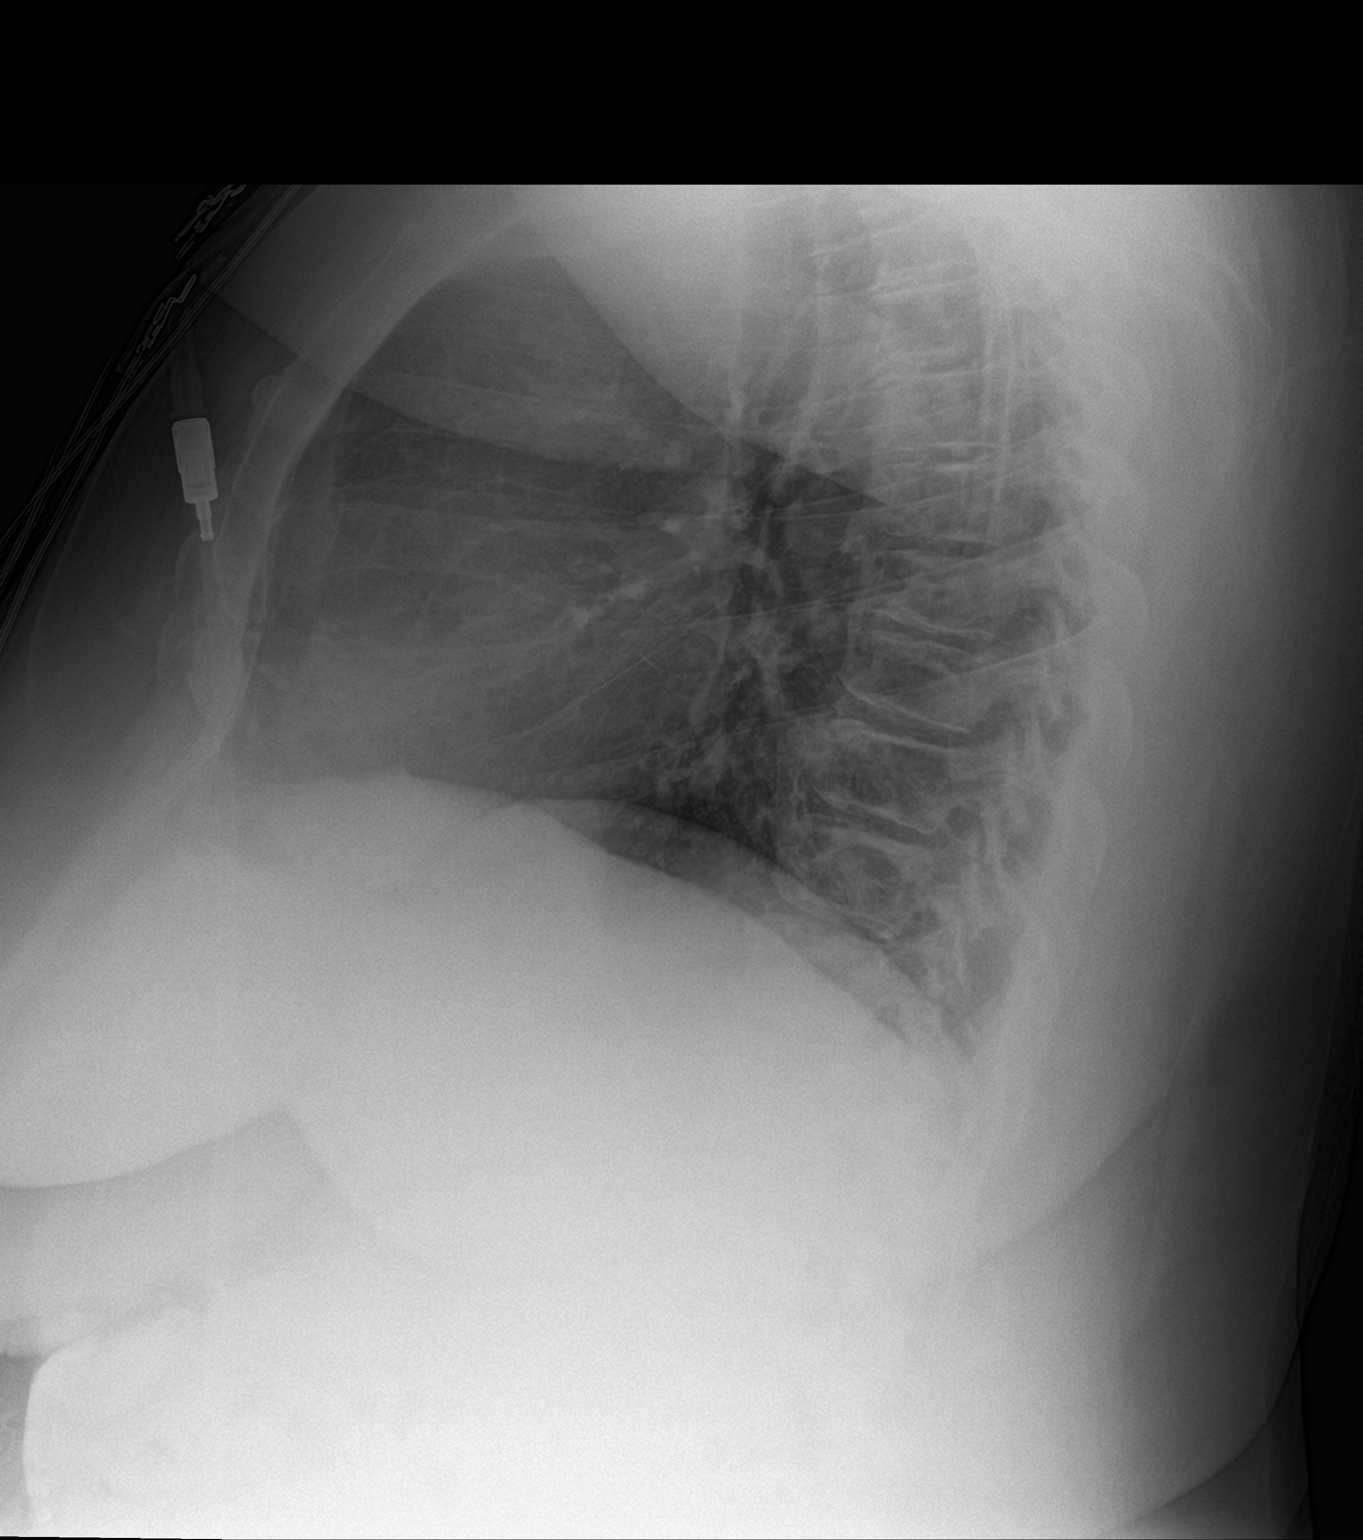

[2 of 2 positions shown; findings below may reference images not displayed]

FINDINGS: The cardiomediastinal contours are normal. The lungs are clear.
Pulmonary vasculature is normal. No consolidation, pleural effusion,
or pneumothorax. No acute osseous abnormalities are seen.
IMPRESSION: No acute abnormality.

## 2017-07-23 MED ORDER — IBUPROFEN 400 MG PO TABS
600.0000 mg | ORAL_TABLET | Freq: Once | ORAL | Status: AC
  Administered 2017-07-23: 07:00:00 600 mg via ORAL
  Filled 2017-07-23: qty 1

## 2017-07-23 MED ORDER — OXYCODONE-ACETAMINOPHEN 5-325 MG PO TABS
1.0000 | ORAL_TABLET | Freq: Once | ORAL | Status: AC
  Administered 2017-07-23: 1 via ORAL
  Filled 2017-07-23: qty 1

## 2017-07-23 MED ORDER — ALBUTEROL SULFATE (2.5 MG/3ML) 0.083% IN NEBU
5.0000 mg | INHALATION_SOLUTION | Freq: Once | RESPIRATORY_TRACT | Status: AC
  Administered 2017-07-23: 5 mg via RESPIRATORY_TRACT
  Filled 2017-07-23: qty 6

## 2017-07-23 NOTE — ED Triage Notes (Addendum)
Pt from home. C/o left sided CP x2 days. Endorses SHOB. Sts pain gets worse w/ inhalation and has had a cough x several days. Hx of HTN & diabetes. Rates pain @ 8/10. Pt A&O x4.

## 2017-07-25 NOTE — ED Provider Notes (Signed)
[facility] Provider Note   CSN: [id number] Arrival date & time: 07/23/17  0459     History   Chief Complaint Chief Complaint  Patient presents with  . Chest Pain    HPI Monique Holland is a 52 y.o. female.  HPI 52 year old female with a history of asthma, diabetes, and hypertension presents to the emergency department with complaints of left-sided chest discomfort over the past 2 days.  Reports productive cough and some pleuritic left-sided chest discomfort.  No unilateral leg swelling.  No history of DVT or pulmonary embolism.  She is tried her albuterol inhaler with some improvement.  Past Medical History:  Diagnosis Date  . Anxiety   . Asthma   . Depression   . Diabetes mellitus without complication (HCC)   . Frequency of urination   . GERD (gastroesophageal reflux disease)   . History of asthma    last episode yrs ago  . Hypertension   . OSA (obstructive sleep apnea)    pt had study done oct 2014--  schedule for cpap titrate after kidney stone surgery  . Schizophrenia (HCC)   . Ureteral calculi    BILATERAL  . Wears glasses     Patient Active Problem List   Diagnosis Date Noted  . Acute appendicitis 05/22/2014  . Ureteral calculus 05/02/2013    Past Surgical History:  Procedure Laterality Date  . CESAREAN SECTION  1991  &  2002   w/ bilateral tubal ligation in 2002  . CYSTOSCOPY WITH RETROGRADE PYELOGRAM, URETEROSCOPY AND STENT PLACEMENT Bilateral 05/02/2013   Procedure: CYSTOSCOPY WITH RETROGRADE PYELOGRAM, URETEROSCOPY ;  Surgeon: Valetta Fulleravid S Grapey, MD;  [facility] [place];  Service: Urology;  Laterality: Bilateral;  . CYSTOSCOPY WITH STENT PLACEMENT Left 05/02/2013   Procedure: CYSTOSCOPY WITH STENT PLACEMENT;  Surgeon: Valetta Fulleravid S Grapey, MD;  [facility] [place];  Service: Urology;  Laterality: Left;  . HOLMIUM LASER APPLICATION Bilateral 05/02/2013   Procedure: HOLMIUM LASER APPLICATION;   Surgeon: Valetta Fulleravid S Grapey, MD;  [facility] [place];  Service: Urology;  Laterality: Bilateral;  . LAPAROSCOPIC APPENDECTOMY N/A 05/22/2014   Procedure: APPENDECTOMY LAPAROSCOPIC;  Surgeon: Glenna FellowsBenjamin Hoxworth, MD;  Location: [facility];  Service: General;  Laterality: N/A;  . TONSILLECTOMY  age 52    OB History    No data available       Home Medications    Prior to Admission medications   Medication Sig Start Date End Date Taking? Authorizing Provider  albuterol (VENTOLIN HFA) 108 (90 Base) MCG/ACT inhaler Inhale 1-2 puffs into the lungs every 6 (six) hours as needed for wheezing or shortness of breath.    [provider]  amitriptyline (ELAVIL) 50 MG tablet Take 50 mg by mouth at bedtime.    [provider]  amLODipine (NORVASC) 10 MG tablet Take 10 mg by mouth every morning.     [provider]  cephALEXin (KEFLEX) 500 MG capsule Take 1 capsule (500 mg total) by mouth 4 (four) times daily. 03/01/16   Dowless, Samantha Tripp, PA-C  clonazePAM (KLONOPIN) 1 MG tablet Take 1 mg by mouth 2 (two) times daily as needed for anxiety.    [provider]  HYDROcodone-acetaminophen (NORCO/VICODIN) 5-325 MG per tablet Take 1 tablet by mouth every 6 (six) hours as needed. Patient not taking: Reported on 03/01/2016 02/12/15   Angelina OkFranasiak, Ryan, MD  lisinopril-hydrochlorothiazide (PRINZIDE,ZESTORETIC) 20-25 MG per tablet Take 1 tablet by mouth daily.    [provider]  oxyCODONE (OXYCONTIN) 10 mg 12 hr tablet Take 10 mg by mouth daily as needed.    [provider]  pantoprazole (PROTONIX) 20 MG tablet Take 20 mg by mouth every morning.     [provider]  sertraline (ZOLOFT) 100 MG tablet Take 100 mg by mouth daily.    [provider]  tamsulosin (FLOMAX) 0.4 MG CAPS capsule Take 1 capsule (0.4 mg total) by mouth daily after supper. Patient not taking: Reported on 03/01/2016 04/24/13   Quita Skye, MD    Family  History No family history on file.  Social History Social History   Tobacco Use  . Smoking status: Never Smoker  . Smokeless tobacco: Never Used  Substance Use Topics  . Alcohol use: No  . Drug use: No     Allergies   Patient has no known allergies.   Review of Systems Review of Systems  All other systems reviewed and are negative.    Physical Exam Updated Vital Signs BP 105/69   Pulse 84   Temp 98.4 F (36.9 C) (Oral)   Resp 13   Ht 5\' 11"  (1.803 m)   Wt (!) 147.4 kg (325 lb)   SpO2 100%   BMI 45.33 kg/m   Physical Exam General: Well-appearing.  No acute distress HEENT: Atraumatic.  Normocephalic Eyes: Extraocular movements are normal Cardiovascular regular rate and rhythm.  No murmur Pulmonary: Lungs are clear bilaterally.  Effort normal Abdomen: Soft. Nontender.  No distention Extremity: Full range of motion of major joints. Neuro: Alert and oriented x3 Psych: Affect normal   ED Treatments / Results  Labs (all labs ordered are listed, but only abnormal results are displayed) Labs Reviewed  BASIC METABOLIC PANEL - Abnormal; Notable for the following components:      Result Value   Glucose, Bld 261 (*)    Calcium 10.9 (*)    All other components within normal limits  CBC - Abnormal; Notable for the following components:   Hemoglobin 9.8 (*)    HCT 32.7 (*)    MCH 23.4 (*)    All other components within normal limits  I-STAT TROPONIN, ED  I-STAT BETA HCG BLOOD, ED ([facility], [facility], [facility] ONLY)    EKG  EKG Interpretation  Date/Time:  Sunday July 23 2017 05:06:37 EST Ventricular Rate:  89 PR Interval:    QRS Duration: 80 QT Interval:  483 QTC Calculation: 588 R Axis:   -14 Text Interpretation:  Sinus rhythm Borderline repolarization abnormality Prolonged QT interval No significant change since last tracing 30 Oct 2012 Confirmed by Devoria Albe ([id number]) on 07/24/2017 5:56:11 PM       Radiology Dg Chest 2 View  Result Date: 07/23/2017 CLINICAL DATA:   Left-sided chest pain for 2 days. Shortness of breath. EXAM: CHEST  2 VIEW COMPARISON:  Radiograph 10/30/2012 FINDINGS: The cardiomediastinal contours are normal. The lungs are clear. Pulmonary vasculature is normal. No consolidation, pleural effusion, or pneumothorax. No acute osseous abnormalities are seen. IMPRESSION: No acute abnormality. Electronically Signed   By: Rubye Oaks M.D.   On: 07/23/2017 06:11    Procedures Procedures (including critical care time)  Medications Ordered in ED Medications  ibuprofen (ADVIL,MOTRIN) tablet 600 mg (600 mg Oral Given 07/23/17 0643)  oxyCODONE-acetaminophen (PERCOCET/ROXICET) 5-325 MG per tablet 1 tablet (1 tablet Oral Given 07/23/17 0643)  albuterol (PROVENTIL) (2.5 MG/3ML) 0.083% nebulizer solution 5 mg (5 mg Nebulization Given 07/23/17 6578)     Initial Impression / Assessment and Plan /  ED Course  I have reviewed the triage vital signs and the nursing notes.  Pertinent labs & imaging results that were available during my care of the patient were reviewed by me and considered in my medical decision making (see chart for details).     Overall well-appearing.  Doubt ACS.  Doubt pulmonary embolism.  No hypoxia or increased work of breathing.  Patient is PERC negative.  Some improvement after bronchodilation in the emergency department.  Discharged home in good condition.  Primary care follow-up.  No indication for additional testing or acute hospitalization at this time.  Final Clinical Impressions(s) / ED Diagnoses   Final diagnoses:  Pleurisy  Precordial chest pain  Wheezing    ED Discharge Orders    None       Azalia Bilis, MD 07/25/17 [phone]

## 2017-09-09 ENCOUNTER — Other Ambulatory Visit: Payer: Self-pay

## 2017-09-09 ENCOUNTER — Encounter (HOSPITAL_BASED_OUTPATIENT_CLINIC_OR_DEPARTMENT_OTHER): Payer: Self-pay | Admitting: Emergency Medicine

## 2017-09-09 ENCOUNTER — Emergency Department (HOSPITAL_BASED_OUTPATIENT_CLINIC_OR_DEPARTMENT_OTHER)
Admission: EM | Admit: 2017-09-09 | Discharge: 2017-09-09 | Disposition: A | Discharge status: Home / Self Care | Payer: Medicare Other | Attending: Emergency Medicine | Admitting: Emergency Medicine

## 2017-09-09 ENCOUNTER — Emergency Department (HOSPITAL_BASED_OUTPATIENT_CLINIC_OR_DEPARTMENT_OTHER): Payer: Medicare Other

## 2017-09-09 DIAGNOSIS — I10 Essential (primary) hypertension: Secondary | ICD-10-CM | POA: Insufficient documentation

## 2017-09-09 DIAGNOSIS — N390 Urinary tract infection, site not specified: Secondary | ICD-10-CM | POA: Insufficient documentation

## 2017-09-09 DIAGNOSIS — E119 Type 2 diabetes mellitus without complications: Secondary | ICD-10-CM | POA: Insufficient documentation

## 2017-09-09 DIAGNOSIS — J45909 Unspecified asthma, uncomplicated: Secondary | ICD-10-CM | POA: Insufficient documentation

## 2017-09-09 DIAGNOSIS — R109 Unspecified abdominal pain: Secondary | ICD-10-CM

## 2017-09-09 DIAGNOSIS — R1084 Generalized abdominal pain: Secondary | ICD-10-CM | POA: Diagnosis present

## 2017-09-09 DIAGNOSIS — Z79899 Other long term (current) drug therapy: Secondary | ICD-10-CM | POA: Insufficient documentation

## 2017-09-09 LAB — CBC WITH DIFFERENTIAL/PLATELET
Basophils Absolute: 0 10*3/uL (ref 0.0–0.1)
Basophils Relative: 1 %
EOS ABS: 0.2 10*3/uL (ref 0.0–0.7)
Eosinophils Relative: 3 %
HEMATOCRIT: 33.5 % — AB (ref 36.0–46.0)
HEMOGLOBIN: 10.5 g/dL — AB (ref 12.0–15.0)
LYMPHS ABS: 2.5 10*3/uL (ref 0.7–4.0)
Lymphocytes Relative: 36 %
MCH: 23.3 pg — ABNORMAL LOW (ref 26.0–34.0)
MCHC: 31.3 g/dL (ref 30.0–36.0)
MCV: 74.4 fL — ABNORMAL LOW (ref 78.0–100.0)
MONO ABS: 0.6 10*3/uL (ref 0.1–1.0)
MONOS PCT: 9 %
Neutro Abs: 3.6 10*3/uL (ref 1.7–7.7)
Neutrophils Relative %: 51 %
Platelets: 272 10*3/uL (ref 150–400)
RBC: 4.5 MIL/uL (ref 3.87–5.11)
RDW: 14.6 % (ref 11.5–15.5)
WBC: 6.9 10*3/uL (ref 4.0–10.5)

## 2017-09-09 LAB — BASIC METABOLIC PANEL
ANION GAP: 10 (ref 5–15)
BUN: 13 mg/dL (ref 6–20)
CHLORIDE: 101 mmol/L (ref 101–111)
CO2: 23 mmol/L (ref 22–32)
Calcium: 11.5 mg/dL — ABNORMAL HIGH (ref 8.9–10.3)
Creatinine, Ser: 0.92 mg/dL (ref 0.44–1.00)
GFR calc non Af Amer: >60 mL/min (ref >60)
Glucose, Bld: 390 mg/dL — ABNORMAL HIGH (ref 65–99)
Potassium: 4.1 mmol/L (ref 3.5–5.1)
Sodium: 134 mmol/L — ABNORMAL LOW (ref 135–145)

## 2017-09-09 LAB — URINALYSIS, ROUTINE W REFLEX MICROSCOPIC
BILIRUBIN URINE: NEGATIVE
GLUCOSE, UA: 250 mg/dL — AB
Hgb urine dipstick: NEGATIVE
Ketones, ur: NEGATIVE mg/dL
Nitrite: NEGATIVE
PH: 6.5 (ref 5.0–8.0)
Protein, ur: NEGATIVE mg/dL
SPECIFIC GRAVITY, URINE: 1.015 (ref 1.005–1.030)

## 2017-09-09 LAB — URINALYSIS, MICROSCOPIC (REFLEX)

## 2017-09-09 LAB — PREGNANCY, URINE: Preg Test, Ur: NEGATIVE

## 2017-09-09 IMAGING — CT CT RENAL STONE PROTOCOL
2 of 4 series · 16 of 46 positions shown, 18 images · non-contrast
Comparison: Prior CT from [DATE].

CLINICAL DATA: Initial evaluation for right lower quadrant
abdominal pain radiating to back, hematuria. History of kidney
stones.

EXAM:
CT ABDOMEN AND PELVIS WITHOUT CONTRAST
TECHNIQUE: Multidetector CT imaging of the abdomen and pelvis was performed
following the standard protocol without IV contrast.

[Series 2: axial st · axial · 0.94mm/px · z∈[+826,+1256]mm · 13 of 96 slices shown, 15 images]
[im 5/96  soft-tissue]
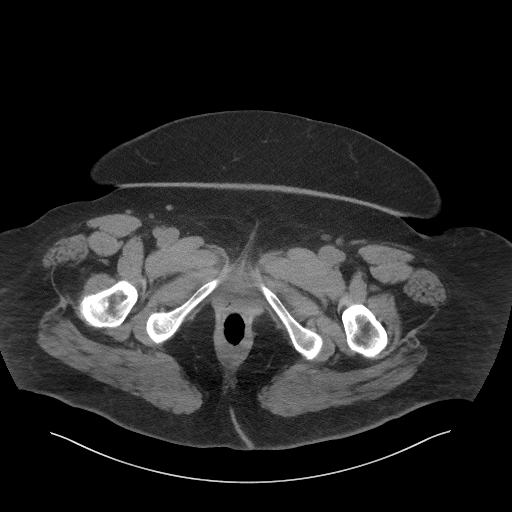
[im 5/96  bone]
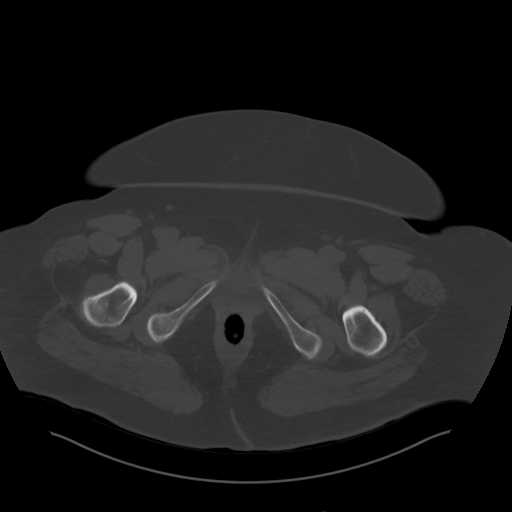
[im 13/96  soft-tissue]
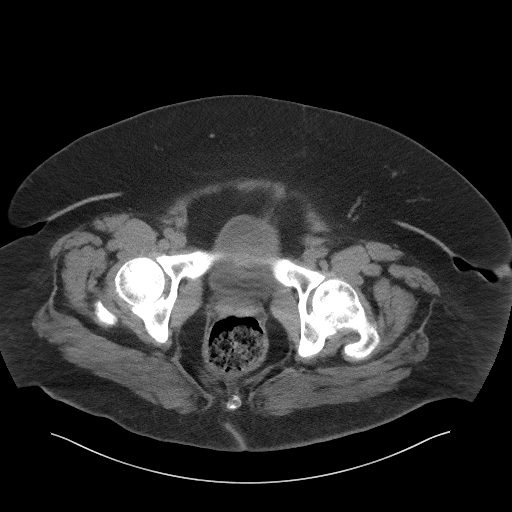
[im 21/96  soft-tissue]
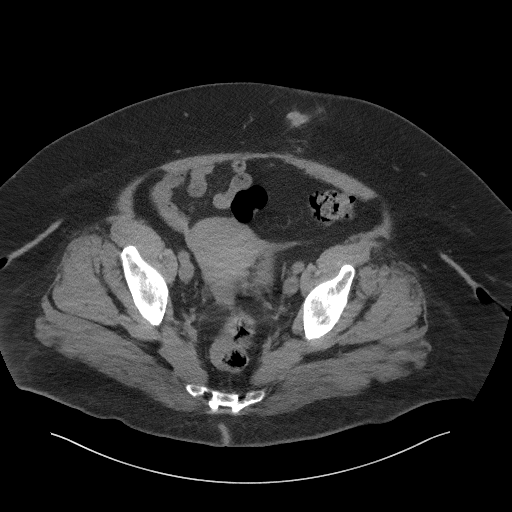
[im 25/96  soft-tissue]
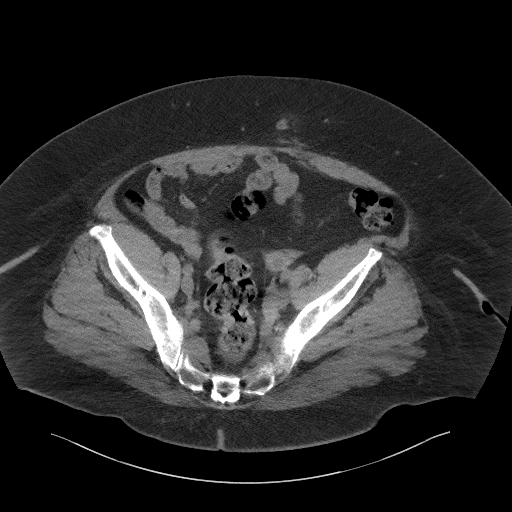
[im 34/96  soft-tissue]
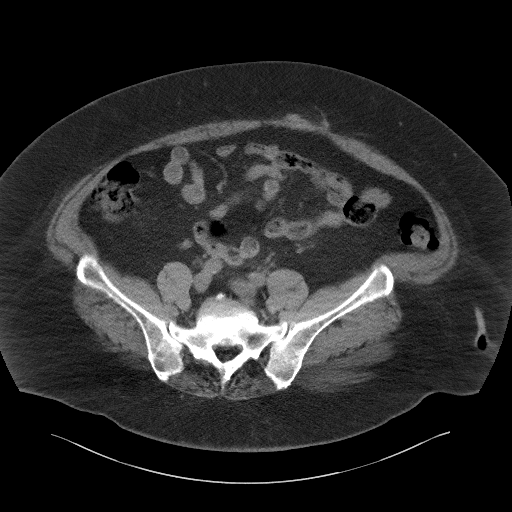
[im 42/96  soft-tissue]
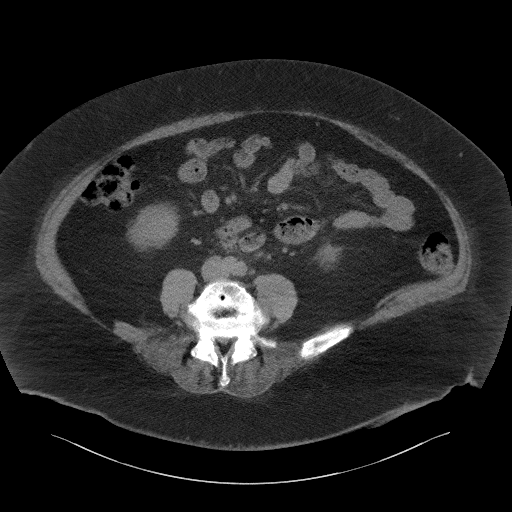
[im 50/96  soft-tissue]
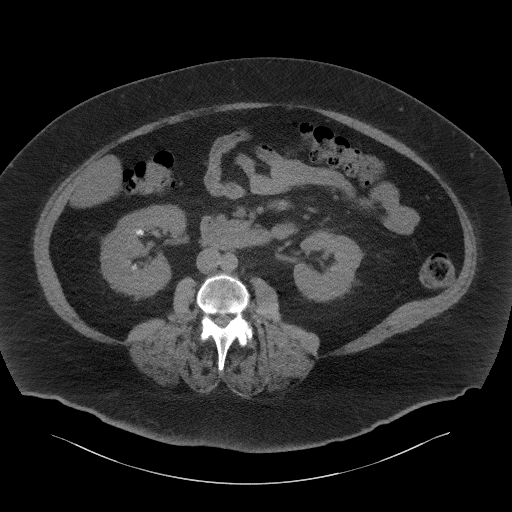
[im 54/96  soft-tissue]
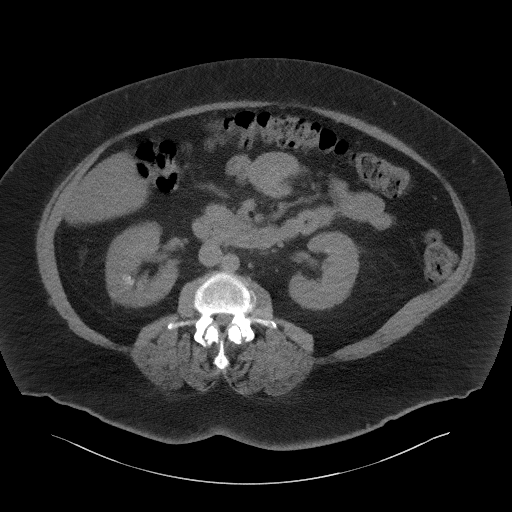
[im 62/96  soft-tissue]
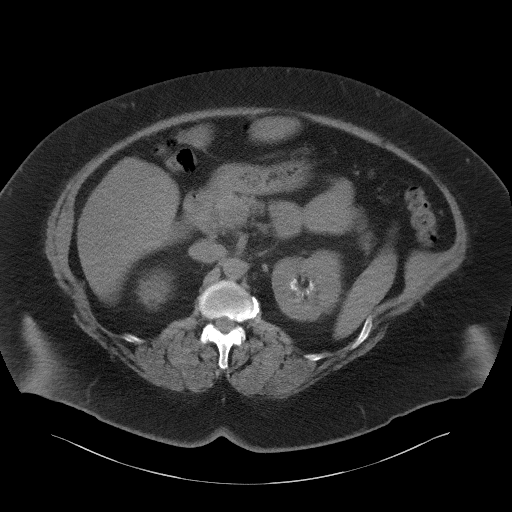
[im 62/96  bone]
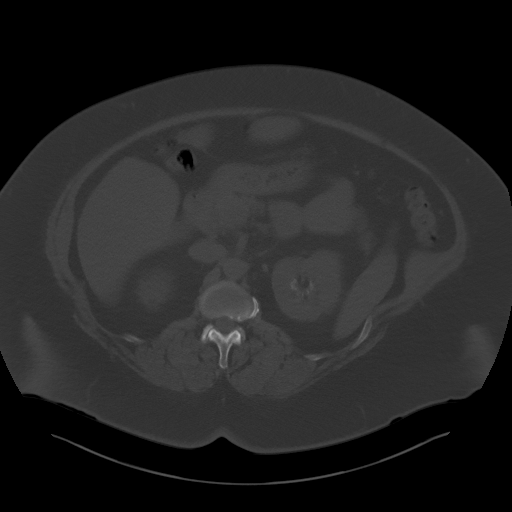
[im 71/96  soft-tissue]
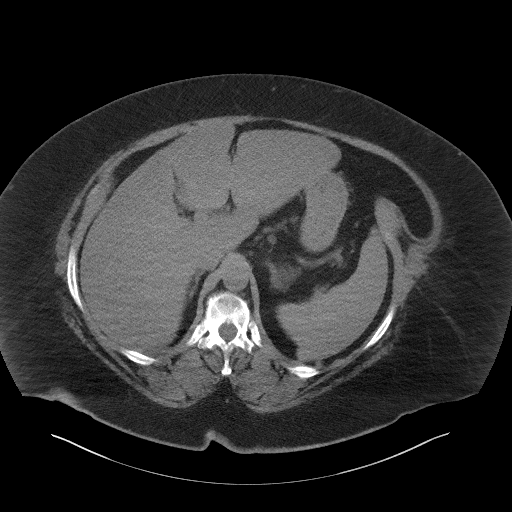
[im 75/96  soft-tissue]
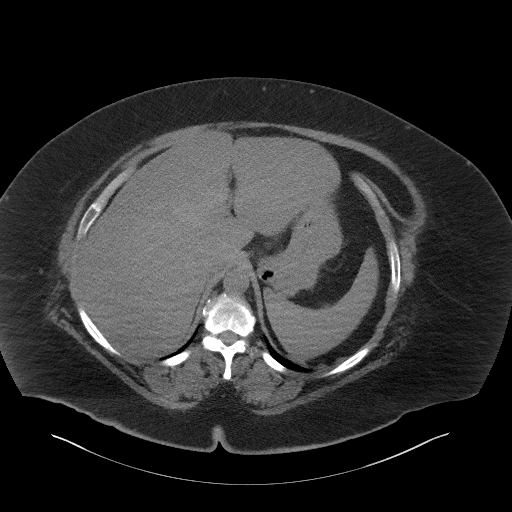
[im 83/96  soft-tissue]
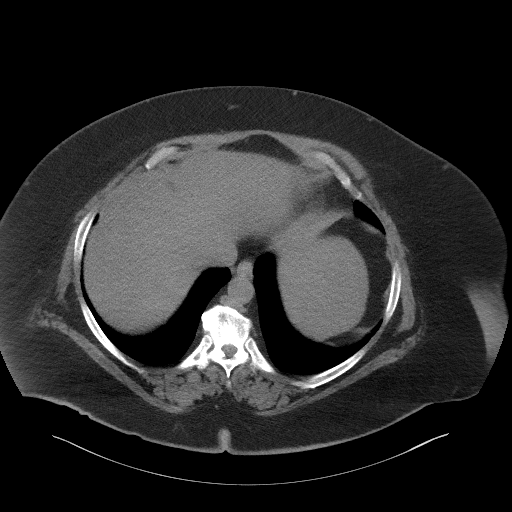
[im 91/96  soft-tissue]
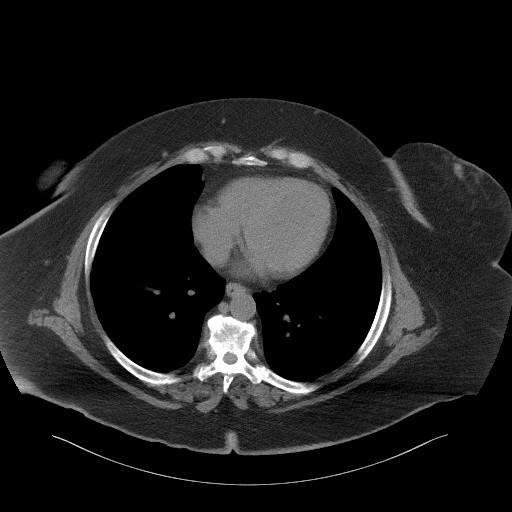

[Series 5: coronal st · coronal · 0.95mm/px · 3 of 127 slices shown]
[im 43/127  soft-tissue]
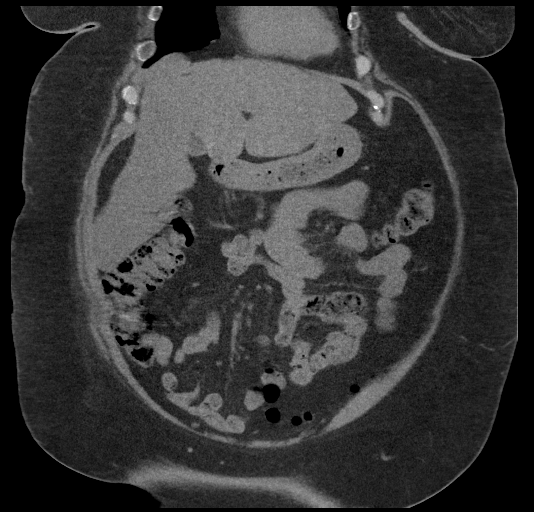
[im 57/127  soft-tissue]
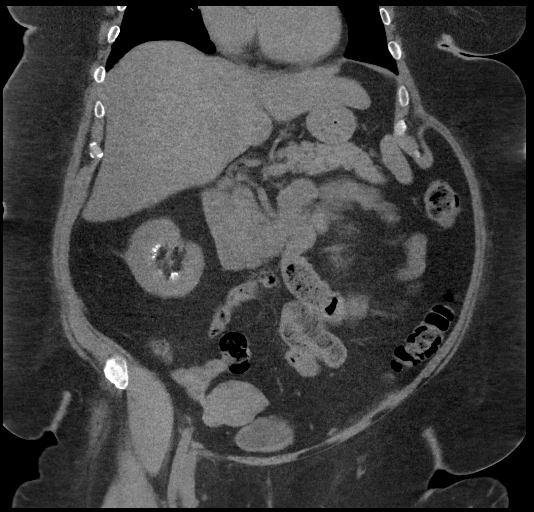
[im 71/127  soft-tissue]
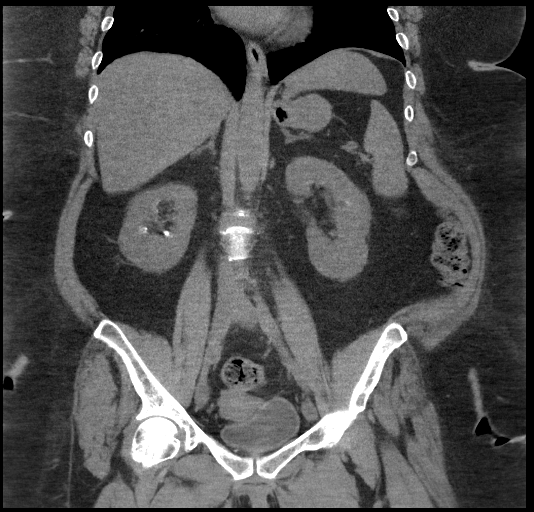

[16 of 46 positions shown; findings below may reference images not displayed]

FINDINGS: Lower chest: Minimal patchy subpleural ground-glass opacity within
the peripheral right lung favored to be atelectatic in nature.
Visualized lungs are otherwise clear.

Hepatobiliary: Limited noncontrast evaluation of the liver is
unremarkable. Gallbladder contracted without acute abnormality. No
biliary dilatation.

Pancreas: Pancreas grossly unremarkable, although evaluation
somewhat limited by motion artifact.

Spleen: Spleen within normal limits.

Adrenals/Urinary Tract: Adrenal glands grossly unremarkable.

Multiple scattered nonobstructive stones present within the kidneys
bilaterally, measuring up to approximately 4-5 mm in size. No
definite radiopaque calculi seen along the course of either renal
collecting system. No hydronephrosis or hydroureter. Partially
distended bladder within normal limits. No layering stones within
the bladder lumen.

Stomach/Bowel: Stomach within normal limits. No evidence for bowel
obstruction. Appendix is surgically absent. No acute inflammatory
changes seen about the bowels.

Vascular/Lymphatic: Intra-abdominal aorta of normal caliber. No
adenopathy.

Reproductive: Uterus within normal limits. Tiny 9 mm left ovarian
cyst noted, of doubtful significance. Ovaries otherwise
unremarkable.

Other: No free intraperitoneal air. No free fluid. Hazy mesenteric
stranding noted at the root of the mesentery.

Musculoskeletal: No acute osseous abnormality. No worrisome lytic or
blastic osseous lesions. Chronic bilateral pars defects at L4 with
grade 1 spondylolisthesis. Advanced degenerative spondylolysis with
facet arthrosis present at L4-5 and L5-S1.
IMPRESSION: 1. Bilateral nonobstructive nephrolithiasis. No CT evidence for
obstructive uropathy.
2. No other acute intra-abdominal or pelvic process.
3. Chronic bilateral pars defects at L4 with grade 1
spondylolisthesis and advanced degenerative spondylolysis at L4-5
and L5-S1.

## 2017-09-09 MED ORDER — CEFTRIAXONE SODIUM 1 G IJ SOLR
1.0000 g | Freq: Once | INTRAMUSCULAR | Status: AC
  Administered 2017-09-09: 1 g via INTRAMUSCULAR
  Filled 2017-09-09: qty 10

## 2017-09-09 MED ORDER — SODIUM CHLORIDE 0.9 % IV SOLN: 1.0000 g | Freq: Once | INTRAVENOUS | Status: DC

## 2017-09-09 MED ORDER — CEPHALEXIN 500 MG PO CAPS: 500.0000 mg | ORAL_CAPSULE | Freq: Three times a day (TID) | ORAL | 0 refills | Status: DC

## 2017-09-09 MED ORDER — LIDOCAINE HCL (PF) 1 % IJ SOLN
INTRAMUSCULAR | Status: AC
  Filled 2017-09-09: qty 5

## 2017-09-09 MED ORDER — KETOROLAC TROMETHAMINE 30 MG/ML IJ SOLN
30.0000 mg | Freq: Once | INTRAMUSCULAR | Status: AC
  Administered 2017-09-09: 30 mg via INTRAMUSCULAR
  Filled 2017-09-09: qty 1

## 2017-09-09 NOTE — ED Provider Notes (Signed)
[facility] EMERGENCY DEPARTMENT Provider Note   CSN: [id number] Arrival date & time: 09/09/17  0448     History   Chief Complaint Chief Complaint  Patient presents with  . Abdominal Pain    HPI Monique Holland is a 52 y.o. female.  Patient complains of right sided flank pain and abdominal pain that woke her from sleep.  States pain is been constant since midnight and was not there when she went to bed.  She did not take anything for it at home.  She is never had this kind of pain before.  She denies any fevers, chills, nausea, vomiting or diarrhea.  Contrary to triage note she denies burning with urination or blood in the urine.  She denies any vaginal bleeding or discharge.  Has had kidney stones in the past but this does not feel similar.  Previous appendectomy.  Denies any back pain.  Pain does not radiate down buttock or leg.  The history is provided by the patient.  Abdominal Pain   Pertinent negatives include fever, nausea, vomiting, dysuria, hematuria, headaches, arthralgias and myalgias.    Past Medical History:  Diagnosis Date  . Anxiety   . Asthma   . Depression   . Diabetes mellitus without complication (HCC)   . Frequency of urination   . GERD (gastroesophageal reflux disease)   . History of asthma    last episode yrs ago  . Hypertension   . OSA (obstructive sleep apnea)    pt had study done oct 2014--  schedule for cpap titrate after kidney stone surgery  . Schizophrenia (HCC)   . Ureteral calculi    BILATERAL  . Wears glasses     Patient Active Problem List   Diagnosis Date Noted  . Acute appendicitis 05/22/2014  . Ureteral calculus 05/02/2013    Past Surgical History:  Procedure Laterality Date  . CESAREAN SECTION  1991  &  2002   w/ bilateral tubal ligation in 2002  . CYSTOSCOPY WITH RETROGRADE PYELOGRAM, URETEROSCOPY AND STENT PLACEMENT Bilateral 05/02/2013   Procedure: CYSTOSCOPY WITH RETROGRADE PYELOGRAM, URETEROSCOPY ;  Surgeon: Valetta Fuller, MD;  Location: [facility];  Service: Urology;  Laterality: Bilateral;  . CYSTOSCOPY WITH STENT PLACEMENT Left 05/02/2013   Procedure: CYSTOSCOPY WITH STENT PLACEMENT;  Surgeon: Valetta Fuller, MD;  Location: [facility];  Service: Urology;  Laterality: Left;  . HOLMIUM LASER APPLICATION Bilateral 05/02/2013   Procedure: HOLMIUM LASER APPLICATION;  Surgeon: Valetta Fuller, MD;  Location: [facility];  Service: Urology;  Laterality: Bilateral;  . LAPAROSCOPIC APPENDECTOMY N/A 05/22/2014   Procedure: APPENDECTOMY LAPAROSCOPIC;  Surgeon: Glenna Fellows, MD;  Location: [facility];  Service: General;  Laterality: N/A;  . TONSILLECTOMY  age 35     OB History   None      Home Medications    Prior to Admission medications   Medication Sig Start Date End Date Taking? Authorizing Provider  albuterol (VENTOLIN HFA) 108 (90 Base) MCG/ACT inhaler Inhale 1-2 puffs into the lungs every 6 (six) hours as needed for wheezing or shortness of breath.    [provider]  amitriptyline (ELAVIL) 50 MG tablet Take 50 mg by mouth at bedtime.    [provider]  amLODipine (NORVASC) 10 MG tablet Take 10 mg by mouth every morning.     [provider]  cephALEXin (KEFLEX) 500 MG capsule Take 1 capsule (500 mg total) by mouth 4 (four) times daily.  03/01/16   Dowless, Samantha Tripp, PA-C  clonazePAM (KLONOPIN) 1 MG tablet Take 1 mg by mouth 2 (two) times daily as needed for anxiety.    [provider]  HYDROcodone-acetaminophen (NORCO/VICODIN) 5-325 MG per tablet Take 1 tablet by mouth every 6 (six) hours as needed. Patient not taking: Reported on 03/01/2016 02/12/15   Angelina Ok, MD  lisinopril-hydrochlorothiazide (PRINZIDE,ZESTORETIC) 20-25 MG per tablet Take 1 tablet by mouth daily.    [provider]  oxyCODONE (OXYCONTIN) 10 mg 12 hr tablet Take 10 mg by mouth daily as needed.    [provider]    pantoprazole (PROTONIX) 20 MG tablet Take 20 mg by mouth every morning.     [provider]  sertraline (ZOLOFT) 100 MG tablet Take 100 mg by mouth daily.    [provider]  tamsulosin (FLOMAX) 0.4 MG CAPS capsule Take 1 capsule (0.4 mg total) by mouth daily after supper. Patient not taking: Reported on 03/01/2016 04/24/13   Quita Skye, MD    Family History No family history on file.  Social History Social History   Tobacco Use  . Smoking status: Never Smoker  . Smokeless tobacco: Never Used  Substance Use Topics  . Alcohol use: No  . Drug use: No     Allergies   Patient has no known allergies.   Review of Systems Review of Systems  Constitutional: Negative for activity change, appetite change, fatigue and fever.  HENT: Negative for congestion and rhinorrhea.   Eyes: Negative for visual disturbance.  Respiratory: Negative for cough, chest tightness and shortness of breath.   Gastrointestinal: Positive for abdominal pain. Negative for nausea and vomiting.  Genitourinary: Negative for dysuria, hematuria, vaginal bleeding and vaginal discharge.  Musculoskeletal: Negative for arthralgias, back pain and myalgias.  Skin: Negative for rash.  Neurological: Negative for dizziness, weakness and headaches.    all other systems are negative except as noted in the HPI and PMH.    Physical Exam Updated Vital Signs BP 127/73 (BP Location: Right Arm)   Pulse 89   Temp 98.7 F (37.1 C) (Oral)   Resp 20   Ht 5\' 11"  (1.803 m)   Wt (!) 156 kg (344 lb)   SpO2 98%   BMI 47.98 kg/m   Physical Exam  Constitutional: She is oriented to person, place, and time. She appears well-developed and well-nourished. No distress.  Obese comfortable  HENT:  Head: Normocephalic and atraumatic.  Mouth/Throat: Oropharynx is clear and moist. No oropharyngeal exudate.  Eyes: Pupils are equal, round, and reactive to light. Conjunctivae and EOM are normal.  Neck: Normal range  of motion. Neck supple.  No meningismus.  Cardiovascular: Normal rate, regular rhythm, normal heart sounds and intact distal pulses.  No murmur heard. Pulmonary/Chest: Effort normal and breath sounds normal. No respiratory distress.  Abdominal: Soft. There is tenderness. There is no rebound and no guarding.  TTP R flank. No pain at McBurney's point.  Musculoskeletal: Normal range of motion. She exhibits no edema or tenderness.  No CVAT  Neurological: She is alert and oriented to person, place, and time. No cranial nerve deficit. She exhibits normal muscle tone. Coordination normal.  No ataxia on finger to nose bilaterally. No pronator drift. 5/5 strength throughout. CN 2-12 intact.Equal grip strength. Sensation intact.   Skin: Skin is warm. Capillary refill takes less than 2 seconds. No rash noted.  Psychiatric: She has a normal mood and affect. Her behavior is normal.  Nursing note and vitals reviewed.  ED Treatments / Results  Labs (all labs ordered are listed, but only abnormal results are displayed) Labs Reviewed  URINALYSIS, ROUTINE W REFLEX MICROSCOPIC - Abnormal; Notable for the following components:      Result Value   Glucose, UA 250 (*)    Leukocytes, UA SMALL (*)    All other components within normal limits  CBC WITH DIFFERENTIAL/PLATELET - Abnormal; Notable for the following components:   Hemoglobin 10.5 (*)    HCT 33.5 (*)    MCV 74.4 (*)    MCH 23.3 (*)    All other components within normal limits  BASIC METABOLIC PANEL - Abnormal; Notable for the following components:   Sodium 134 (*)    Glucose, Bld 390 (*)    Calcium 11.5 (*)    All other components within normal limits  URINALYSIS, MICROSCOPIC (REFLEX) - Abnormal; Notable for the following components:   Bacteria, UA FEW (*)    Squamous Epithelial / LPF 0-5 (*)    All other components within normal limits  URINE CULTURE  PREGNANCY, URINE    EKG None  Radiology Ct Renal Stone Study  Result Date:  09/09/2017 CLINICAL DATA:  Initial evaluation for right lower quadrant abdominal pain radiating to back, hematuria. History of kidney stones. EXAM: CT ABDOMEN AND PELVIS WITHOUT CONTRAST TECHNIQUE: Multidetector CT imaging of the abdomen and pelvis was performed following the standard protocol without IV contrast. COMPARISON:  Prior CT from 03/10/2016. FINDINGS: Lower chest: Minimal patchy subpleural ground-glass opacity within the peripheral right lung favored to be atelectatic in nature. Visualized lungs are otherwise clear. Hepatobiliary: Limited noncontrast evaluation of the liver is unremarkable. Gallbladder contracted without acute abnormality. No biliary dilatation. Pancreas: Pancreas grossly unremarkable, although evaluation somewhat limited by motion artifact. Spleen: Spleen within normal limits. Adrenals/Urinary Tract: Adrenal glands grossly unremarkable. Multiple scattered nonobstructive stones present within the kidneys bilaterally, measuring up to approximately 4-5 mm in size. No definite radiopaque calculi seen along the course of either renal collecting system. No hydronephrosis or hydroureter. Partially distended bladder within normal limits. No layering stones within the bladder lumen. Stomach/Bowel: Stomach within normal limits. No evidence for bowel obstruction. Appendix is surgically absent. No acute inflammatory changes seen about the bowels. Vascular/Lymphatic: Intra-abdominal aorta of normal caliber. No adenopathy. Reproductive: Uterus within normal limits. Tiny 9 mm left ovarian cyst noted, of doubtful significance. Ovaries otherwise unremarkable. Other: No free intraperitoneal air. No free fluid. Hazy mesenteric stranding noted at the root of the mesentery. Musculoskeletal: No acute osseous abnormality. No worrisome lytic or blastic osseous lesions. 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 Advanced degenerative spondylolysis with facet arthrosis present at L4-5  and L5-S1. IMPRESSION: 1. Bilateral nonobstructive nephrolithiasis. No CT evidence for obstructive uropathy. 2. No other acute intra-abdominal or pelvic process. 3. 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 Electronically Signed   By: Rise MuBenjamin  McClintock M.D.   On: 09/09/2017 06:58    Procedures Procedures (including critical care time)  Medications Ordered in ED Medications  ketorolac (TORADOL) 30 MG/ML injection 30 mg (has no administration in time range)     Initial Impression / Assessment and Plan / ED Course  I have reviewed the triage vital signs and the nursing notes.  Pertinent labs & imaging results that were available during my care of the patient were reviewed by me and considered in my medical decision making (see chart for details).    Right-sided abdominal  pain that woke from sleep.  No associated symptoms.  Previous history of kidney stones.  Urinalysis shows white blood cells and leukocytes but no hematuria globin.  Culture will be sent.  Labs show hyperglycemia without DKA. Hypercalcemia appears chronic.  CT with nephrolithiasis. No ureterolithiasis.   Consider passed kidney stone. Will treat UTI. Culture sent.  Doubt ovarian torsion given location and patient's level of comfort.  Patient appears stable for discharge.  Followup with PCP and urology. Return precautions discussed.  Final Clinical Impressions(s) / ED Diagnoses   Final diagnoses:  Flank pain  Urinary tract infection without hematuria, site unspecified    ED Discharge Orders    None       Tamel Abel, Jeannett Senior, MD 09/09/17 [phone]

## 2017-09-09 NOTE — Discharge Instructions (Addendum)
Take the antibiotics as prescribed.  It is possible he may have passed a kidney stone.  Follow-up with your primary doctor in the urologist.  Return to the ED if you develop new or worsening symptoms.

## 2017-09-09 NOTE — ED Triage Notes (Signed)
Pt c/o 9/10 RLQ abd pain radiating to her back for the past 2 hours, denies any nausea, vomiting or diarrhea no fever. Pt states she has some burning sensation with urination and frequency. VS BP 130/86, HR 88, R 20, SPO2 98% RA. CBG 365.

## 2017-09-11 LAB — URINE CULTURE

## 2017-09-12 ENCOUNTER — Telehealth: Payer: Self-pay | Admitting: Emergency Medicine

## 2017-09-12 NOTE — Telephone Encounter (Signed)
Post ED Visit - Positive Culture Follow-up  Culture report reviewed by antimicrobial stewardship pharmacist:  [x]  Enzo BiNathan Batchelder, Pharm.D. []  Celedonio MiyamotoJeremy Frens, Pharm.D., BCPS AQ-ID []  Garvin FilaMike Maccia, Pharm.D., BCPS []  Georgina PillionElizabeth Martin, [address] BCPS []  WeidmanMinh Pham, [address]Ph[address]D., BCPS, AAHIVP []  Estella HuskMichelle Turner, Pharm.D., BCPS, AAHIVP []  Lysle Pearlachel Rumbarger, PharmD, BCPS []  Blake DivineShannon Parkey, PharmD []  Pollyann SamplesAndy Johnston, PharmD, BCPS  Positive urine culture Treated with cephalexin, organism sensitive to the same and no further patient follow-up is required at this time.  Berle MullMiller, Marian Grandt 09/12/2017, 9:57 AM

## 2018-05-18 ENCOUNTER — Other Ambulatory Visit: Payer: Self-pay

## 2018-05-18 ENCOUNTER — Encounter (HOSPITAL_BASED_OUTPATIENT_CLINIC_OR_DEPARTMENT_OTHER): Payer: Self-pay | Admitting: *Deleted

## 2018-05-18 ENCOUNTER — Emergency Department (HOSPITAL_BASED_OUTPATIENT_CLINIC_OR_DEPARTMENT_OTHER): Payer: 59

## 2018-05-18 ENCOUNTER — Emergency Department (HOSPITAL_BASED_OUTPATIENT_CLINIC_OR_DEPARTMENT_OTHER)
Admission: EM | Admit: 2018-05-18 | Discharge: 2018-05-18 | Disposition: A | Discharge status: Home / Self Care | Payer: 59 | Attending: Emergency Medicine | Admitting: Emergency Medicine

## 2018-05-18 DIAGNOSIS — B9789 Other viral agents as the cause of diseases classified elsewhere: Secondary | ICD-10-CM | POA: Insufficient documentation

## 2018-05-18 DIAGNOSIS — R05 Cough: Secondary | ICD-10-CM | POA: Diagnosis present

## 2018-05-18 DIAGNOSIS — J4521 Mild intermittent asthma with (acute) exacerbation: Secondary | ICD-10-CM | POA: Diagnosis not present

## 2018-05-18 DIAGNOSIS — I10 Essential (primary) hypertension: Secondary | ICD-10-CM | POA: Diagnosis not present

## 2018-05-18 DIAGNOSIS — E119 Type 2 diabetes mellitus without complications: Secondary | ICD-10-CM | POA: Insufficient documentation

## 2018-05-18 DIAGNOSIS — J029 Acute pharyngitis, unspecified: Secondary | ICD-10-CM

## 2018-05-18 DIAGNOSIS — Z79899 Other long term (current) drug therapy: Secondary | ICD-10-CM | POA: Diagnosis not present

## 2018-05-18 DIAGNOSIS — J069 Acute upper respiratory infection, unspecified: Secondary | ICD-10-CM | POA: Diagnosis not present

## 2018-05-18 LAB — CBG MONITORING, ED: Glucose-Capillary: 347 mg/dL — ABNORMAL HIGH (ref 70–99)

## 2018-05-18 IMAGING — CR DG CHEST 2V
2 series · 2 of 2 positions shown · non-contrast
Comparison: PA and lateral chest [DATE].

CLINICAL DATA: Cough, fever and congestion for 2 days.

EXAM:
CHEST - 2 VIEW

[w chest pa]
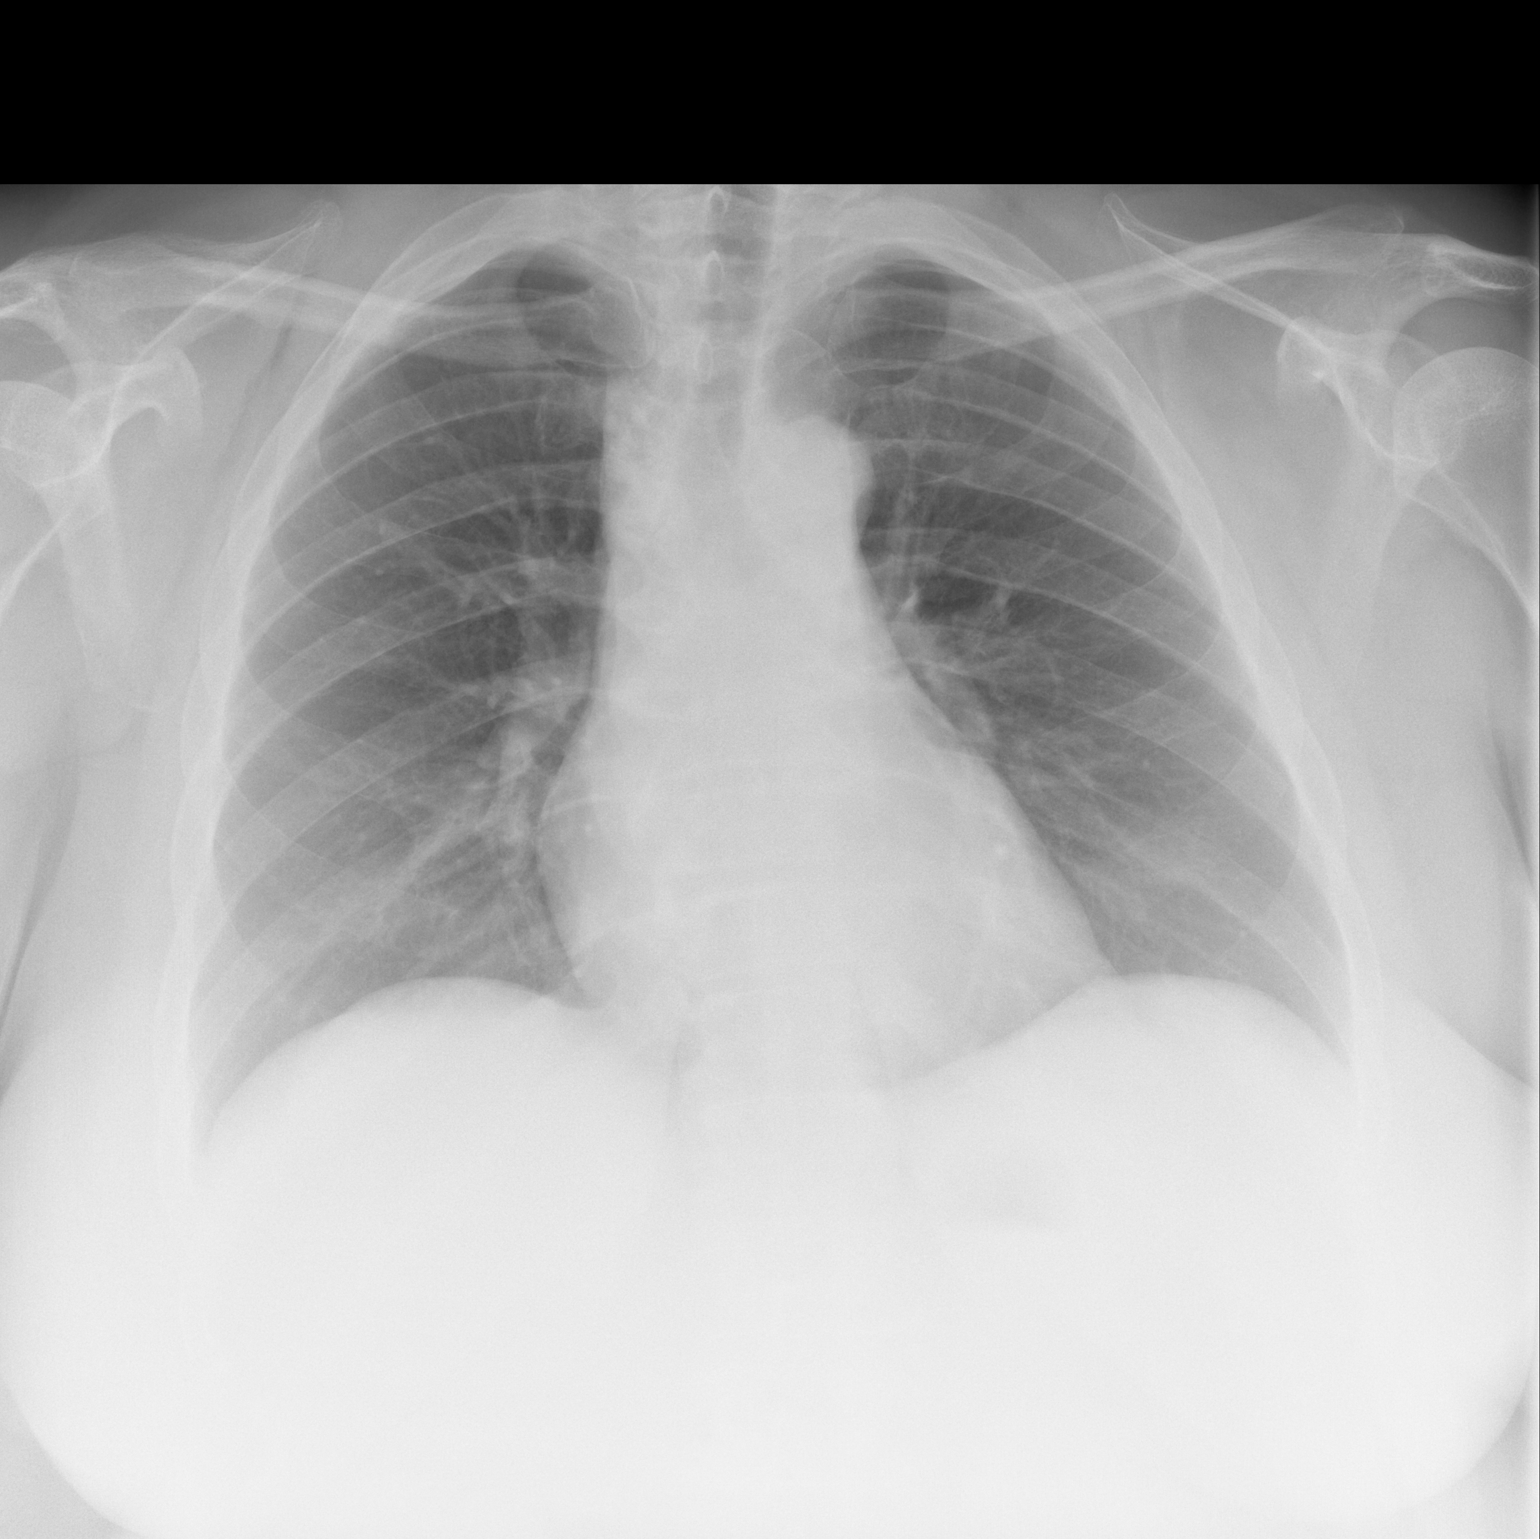

[w chest lat]
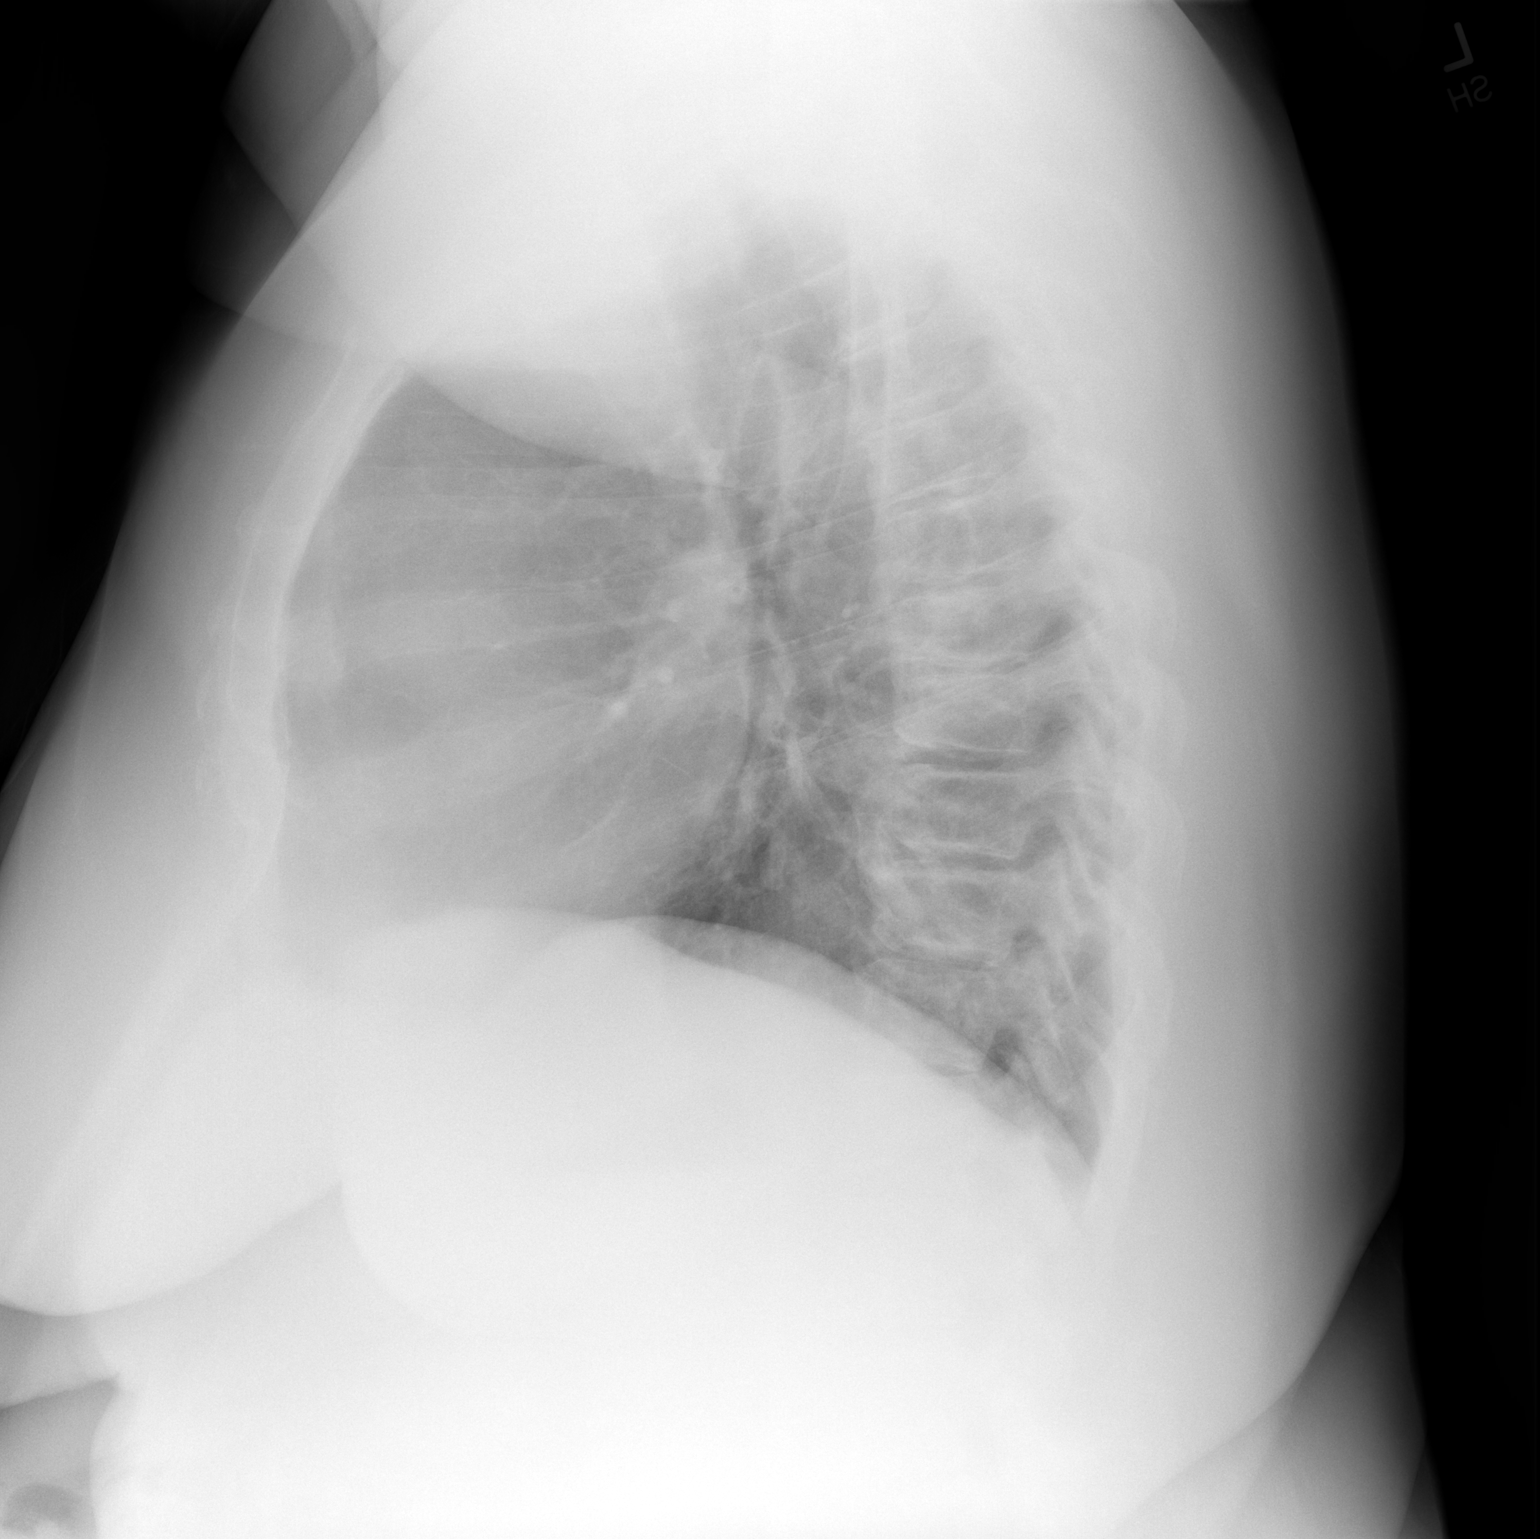

[2 of 2 positions shown; findings below may reference images not displayed]

FINDINGS: The lungs are clear. Heart size is normal. No pneumothorax or
pleural fluid. No acute or focal bony abnormality.
IMPRESSION: Negative chest.

## 2018-05-18 MED ORDER — PREDNISONE 50 MG PO TABS
60.0000 mg | ORAL_TABLET | Freq: Once | ORAL | Status: DC
  Filled 2018-05-18: qty 1

## 2018-05-18 MED ORDER — BENZONATATE 100 MG PO CAPS: 100.0000 mg | ORAL_CAPSULE | Freq: Three times a day (TID) | ORAL | 0 refills | Status: DC | PRN

## 2018-05-18 MED ORDER — IPRATROPIUM-ALBUTEROL 0.5-2.5 (3) MG/3ML IN SOLN
3.0000 mL | Freq: Once | RESPIRATORY_TRACT | Status: AC
  Administered 2018-05-18: 3 mL via RESPIRATORY_TRACT
  Filled 2018-05-18: qty 3

## 2018-05-18 MED ORDER — FLUTICASONE PROPIONATE 50 MCG/ACT NA SUSP
2.0000 | Freq: Every day | NASAL | 0 refills | Status: DC
Start: 2018-05-18 — End: 2018-08-12

## 2018-05-18 NOTE — ED Notes (Signed)
ED Provider at bedside. 

## 2018-05-18 NOTE — Discharge Instructions (Signed)
Drink plenty of water and get plenty of rest.  Gargle warm salt water and spit it out for sore throat. May also use cough drops, warm teas, Chloraseptic spray, etc. Take flonase to decrease nasal congestion.  You can also use over-the-counter allergy medicines for nasal congestion and scratchy throat.  Tessalon as needed for cough.  Use your albuterol inhaler 1 to 2 puffs every 4-6 hours as needed for shortness of breath or wheezing.  Alternate 600 mg of ibuprofen and [phone] mg of Tylenol every 3 hours as needed for pain. Do not exceed 4000 mg of Tylenol daily.     Followup with your primary care doctor in 5-7 days for recheck of ongoing symptoms. Return to emergency department for emergent changing or worsening of symptoms such as throat tightness, facial swelling, fever not controlled by ibuprofen or Tylenol,difficulty breathing, or chest pain.

## 2018-05-18 NOTE — ED Notes (Signed)
Patient transported to X-ray 

## 2018-05-18 NOTE — ED Triage Notes (Signed)
Cough with chest soreness and sore throat x 2 days.  

## 2018-05-18 NOTE — ED Provider Notes (Signed)
[facility] EMERGENCY DEPARTMENT Provider Note   CSN: [id number] Arrival date & time: 05/18/18  1409     History   Chief Complaint Chief Complaint  Patient presents with  . Cough    HPI Monique Holland is a 52 y.o. female with history of anxiety, asthma, diabetes mellitus, GERD, hypertension, OSA, schizophrenia presents for evaluation of acute onset, progressively worsening cough and sore throat for 2 days.  She notes cough productive of clear sputum and nasal congestion.  She denies shortness of breath but notes chest tightness centrally which worsens with cough.  Denies fevers, chills, abdominal pain, nausea, or vomiting.  She has taken 2 puffs of her albuterol inhaler in the past 2 days with minimal relief.  She is a non-smoker.  The history is provided by the patient.    Past Medical History:  Diagnosis Date  . Anxiety   . Asthma   . Depression   . Diabetes mellitus without complication (HCC)   . Frequency of urination   . GERD (gastroesophageal reflux disease)   . History of asthma    last episode yrs ago  . Hypertension   . OSA (obstructive sleep apnea)    pt had study done oct 2014--  schedule for cpap titrate after kidney stone surgery  . Schizophrenia (HCC)   . Ureteral calculi    BILATERAL  . Wears glasses     Patient Active Problem List   Diagnosis Date Noted  . Acute appendicitis 05/22/2014  . Ureteral calculus 05/02/2013    Past Surgical History:  Procedure Laterality Date  . CESAREAN SECTION  1991  &  2002   w/ bilateral tubal ligation in 2002  . CYSTOSCOPY WITH RETROGRADE PYELOGRAM, URETEROSCOPY AND STENT PLACEMENT Bilateral 05/02/2013   Procedure: CYSTOSCOPY WITH RETROGRADE PYELOGRAM, URETEROSCOPY ;  Surgeon: Valetta Fuller, MD;  Location: [facility];  Service: Urology;  Laterality: Bilateral;  . CYSTOSCOPY WITH STENT PLACEMENT Left 05/02/2013   Procedure: CYSTOSCOPY WITH STENT PLACEMENT;  Surgeon: Valetta Fuller, MD;   Location: [facility];  Service: Urology;  Laterality: Left;  . HOLMIUM LASER APPLICATION Bilateral 05/02/2013   Procedure: HOLMIUM LASER APPLICATION;  Surgeon: Valetta Fuller, MD;  Location: [facility];  Service: Urology;  Laterality: Bilateral;  . LAPAROSCOPIC APPENDECTOMY N/A 05/22/2014   Procedure: APPENDECTOMY LAPAROSCOPIC;  Surgeon: Glenna Fellows, MD;  Location: [facility];  Service: General;  Laterality: N/A;  . TONSILLECTOMY  age 4     OB History   No obstetric history on file.      Home Medications    Prior to Admission medications   Medication Sig Start Date End Date Taking? Authorizing Provider  albuterol (VENTOLIN HFA) 108 (90 Base) MCG/ACT inhaler Inhale 1-2 puffs into the lungs every 6 (six) hours as needed for wheezing or shortness of breath.    [provider]  amitriptyline (ELAVIL) 50 MG tablet Take 50 mg by mouth at bedtime.    [provider]  amLODipine (NORVASC) 10 MG tablet Take 10 mg by mouth every morning.     [provider]  benzonatate (TESSALON) 100 MG capsule Take 1 capsule (100 mg total) by mouth 3 (three) times daily as needed for cough. 05/18/18   Neymar Dowe A, PA-C  cephALEXin (KEFLEX) 500 MG capsule Take 1 capsule (500 mg total) by mouth 3 (three) times daily. 09/09/17   Rancour, Jeannett Senior, MD  clonazePAM (KLONOPIN) 1 MG tablet Take 1 mg by mouth 2 (  two) times daily as needed for anxiety.    [provider]  fluticasone (FLONASE) 50 MCG/ACT nasal spray Place 2 sprays into both nostrils daily. 05/18/18   Kamilla Hands A, PA-C  HYDROcodone-acetaminophen (NORCO/VICODIN) 5-325 MG per tablet Take 1 tablet by mouth every 6 (six) hours as needed. Patient not taking: Reported on 03/01/2016 02/12/15   Angelina OkFranasiak, Ryan, MD  lisinopril-hydrochlorothiazide (PRINZIDE,ZESTORETIC) 20-25 MG per tablet Take 1 tablet by mouth daily.    [provider]  oxyCODONE (OXYCONTIN) 10 mg 12 hr tablet Take 10  mg by mouth daily as needed.    [provider]  pantoprazole (PROTONIX) 20 MG tablet Take 20 mg by mouth every morning.     [provider]  sertraline (ZOLOFT) 100 MG tablet Take 100 mg by mouth daily.    [provider]  tamsulosin (FLOMAX) 0.4 MG CAPS capsule Take 1 capsule (0.4 mg total) by mouth daily after supper. Patient not taking: Reported on 03/01/2016 04/24/13   Quita SkyeGhim, Michael, MD    Family History No family history on file.  Social History Social History   Tobacco Use  . Smoking status: Never Smoker  . Smokeless tobacco: Never Used  Substance Use Topics  . Alcohol use: No  . Drug use: No     Allergies   Patient has no known allergies.   Review of Systems Review of Systems  Constitutional: Negative for chills and fever.  HENT: Positive for congestion and sore throat.   Respiratory: Positive for cough and chest tightness.   Cardiovascular: Positive for chest pain (with cough only).  Gastrointestinal: Negative for abdominal pain, nausea and vomiting.     Physical Exam Updated Vital Signs BP 109/75   Pulse 94   Temp 98.1 F (36.7 C) (Oral)   Resp 20   Ht 5\' 6"  (1.676 m)   Wt (!) 150.1 kg   LMP 03/23/2014 Comment: patient states her periods are irregular  SpO2 100%   BMI 53.42 kg/m   Physical Exam Vitals signs and nursing note reviewed.  Constitutional:      General: She is not in acute distress.    Appearance: She is well-developed. She is obese.  HENT:     Head: Normocephalic and atraumatic.     Right Ear: Ear canal normal.     Left Ear: Ear canal normal.     Nose: Congestion present.     Mouth/Throat:     Mouth: Mucous membranes are moist.     Pharynx: Oropharynx is clear. Posterior oropharyngeal erythema present. No oropharyngeal exudate.     Comments: Posterior pharynx with erythema but no trismus, sublingual abnormalities, uvular deviation, or exudates.  Tonsils surgically absent.  Tolerating secretions without  difficulty Eyes:     General:        Right eye: No discharge.        Left eye: No discharge.     Extraocular Movements: Extraocular movements intact.     Conjunctiva/sclera: Conjunctivae normal.     Pupils: Pupils are equal, round, and reactive to light.  Neck:     Vascular: No JVD.     Trachea: No tracheal deviation.  Cardiovascular:     Rate and Rhythm: Normal rate.     Pulses: Normal pulses.     Heart sounds: Normal heart sounds.  Pulmonary:     Effort: Pulmonary effort is normal.     Breath sounds: Wheezing present.     Comments: Speaking in full sentences without difficulty.  Diffuse  expiratory wheezing Chest:     Chest wall: No tenderness.  Abdominal:     General: Bowel sounds are normal. There is no distension.     Tenderness: There is no abdominal tenderness.  Musculoskeletal: Normal range of motion.  Skin:    General: Skin is warm and dry.     Findings: No erythema.  Neurological:     Mental Status: She is alert.  Psychiatric:        Behavior: Behavior normal.      ED Treatments / Results  Labs (all labs ordered are listed, but only abnormal results are displayed) Labs Reviewed  CBG MONITORING, ED - Abnormal; Notable for the following components:      Result Value   Glucose-Capillary 347 (*)    All other components within normal limits    EKG None  Radiology Dg Chest 2 View  Result Date: 05/18/2018 CLINICAL DATA:  Cough, fever and congestion for 2 days. EXAM: CHEST - 2 VIEW COMPARISON:  PA and lateral chest 07/23/2017. FINDINGS: The lungs are clear. Heart size is normal. No pneumothorax or pleural fluid. No acute or focal bony abnormality. IMPRESSION: Negative chest. Electronically Signed   By: Drusilla Kannerhomas  Dalessio M.D.   On: 05/18/2018 14:45    Procedures Procedures (including critical care time)  Medications Ordered in ED Medications  ipratropium-albuterol (DUONEB) 0.5-2.5 (3) MG/3ML nebulizer solution 3 mL (3 mLs Nebulization Given 05/18/18 1451)       Initial Impression / Assessment and Plan / ED Course  I have reviewed the triage vital signs and the nursing notes.  Pertinent labs & imaging results that were available during my care of the patient were reviewed by me and considered in my medical decision making (see chart for details).     Patient with chest tightness, productive cough, nasal congestion.  She is afebrile, vital signs are stable.  She is nontoxic in appearance.  Tolerating secretions without difficulty.  Tonsil surgically absent presentation is not concerning for PTA, retropharyngeal abscess, or strep pharyngitis.  She has diffuse expiratory wheezing on auscultation of the lungs which improved significantly with a breathing treatment.  On reassessment she reports that she is feeling much better.  SPO2 saturations have been stable while in the ED.  Chest x-ray without evidence of pneumonia or pleural effusion.  Doubt ACS/MI or PE.  Suspect viral URI causing asthma exacerbation.  She is a poorly controlled diabetic and has no way to check her blood sugars at home so we will hold off on treatment with prednisone and instead elected to have the patient follow-up with her PCP in the next 24 to 48 hours.  Patient states she feels comfortable with this plan.  Discussed strict ED return precautions.  Patient and husband verbalized understanding of and agreement with plan and patient is stable for discharge home at this time.  Final Clinical Impressions(s) / ED Diagnoses   Final diagnoses:  Viral URI with cough  Sore throat    ED Discharge Orders         Ordered    benzonatate (TESSALON) 100 MG capsule  3 times daily PRN     05/18/18 1520    fluticasone (FLONASE) 50 MCG/ACT nasal spray  Daily     05/18/18 1520           Jeanie SewerFawze, Latrice Storlie A, PA-C 05/18/18 1525    Gwyneth SproutPlunkett, Whitney, MD 05/18/18 1606

## 2018-08-04 ENCOUNTER — Emergency Department (HOSPITAL_BASED_OUTPATIENT_CLINIC_OR_DEPARTMENT_OTHER): Payer: 59

## 2018-08-04 ENCOUNTER — Other Ambulatory Visit: Payer: Self-pay

## 2018-08-04 ENCOUNTER — Encounter (HOSPITAL_BASED_OUTPATIENT_CLINIC_OR_DEPARTMENT_OTHER): Payer: Self-pay | Admitting: Adult Health

## 2018-08-04 ENCOUNTER — Emergency Department (HOSPITAL_BASED_OUTPATIENT_CLINIC_OR_DEPARTMENT_OTHER)
Admission: EM | Admit: 2018-08-04 | Discharge: 2018-08-04 | Disposition: A | Discharge status: Home / Self Care | Payer: 59 | Attending: Emergency Medicine | Admitting: Emergency Medicine

## 2018-08-04 DIAGNOSIS — E119 Type 2 diabetes mellitus without complications: Secondary | ICD-10-CM | POA: Insufficient documentation

## 2018-08-04 DIAGNOSIS — N201 Calculus of ureter: Secondary | ICD-10-CM | POA: Insufficient documentation

## 2018-08-04 DIAGNOSIS — Z79899 Other long term (current) drug therapy: Secondary | ICD-10-CM | POA: Insufficient documentation

## 2018-08-04 DIAGNOSIS — I10 Essential (primary) hypertension: Secondary | ICD-10-CM | POA: Diagnosis not present

## 2018-08-04 DIAGNOSIS — R1011 Right upper quadrant pain: Secondary | ICD-10-CM | POA: Diagnosis present

## 2018-08-04 DIAGNOSIS — J45909 Unspecified asthma, uncomplicated: Secondary | ICD-10-CM | POA: Insufficient documentation

## 2018-08-04 LAB — URINALYSIS, MICROSCOPIC (REFLEX)

## 2018-08-04 LAB — URINALYSIS, ROUTINE W REFLEX MICROSCOPIC
Bilirubin Urine: NEGATIVE
Glucose, UA: NEGATIVE mg/dL
KETONES UR: NEGATIVE mg/dL
Nitrite: POSITIVE — AB
PH: 6 (ref 5.0–8.0)
PROTEIN: NEGATIVE mg/dL
Specific Gravity, Urine: 1.01 (ref 1.005–1.030)

## 2018-08-04 LAB — CBC
HEMATOCRIT: 34.8 % — AB (ref 36.0–46.0)
Hemoglobin: 10 g/dL — ABNORMAL LOW (ref 12.0–15.0)
MCH: 21.8 pg — AB (ref 26.0–34.0)
MCHC: 28.7 g/dL — AB (ref 30.0–36.0)
MCV: 75.8 fL — ABNORMAL LOW (ref 80.0–100.0)
PLATELETS: 392 10*3/uL (ref 150–400)
RBC: 4.59 MIL/uL (ref 3.87–5.11)
RDW: 15.7 % — AB (ref 11.5–15.5)
WBC: 10.3 10*3/uL (ref 4.0–10.5)
nRBC: 0 % (ref 0.0–0.2)

## 2018-08-04 LAB — CBG MONITORING, ED: Glucose-Capillary: 114 mg/dL — ABNORMAL HIGH (ref 70–99)

## 2018-08-04 LAB — COMPREHENSIVE METABOLIC PANEL
ALK PHOS: 54 U/L (ref 38–126)
ALT: 14 U/L (ref 0–44)
AST: 16 U/L (ref 15–41)
Albumin: 3.6 g/dL (ref 3.5–5.0)
Anion gap: 9 (ref 5–15)
BUN: 32 mg/dL — AB (ref 6–20)
CALCIUM: 11.7 mg/dL — AB (ref 8.9–10.3)
CHLORIDE: 102 mmol/L (ref 98–111)
CO2: 22 mmol/L (ref 22–32)
CREATININE: 1.82 mg/dL — AB (ref 0.44–1.00)
GFR calc non Af Amer: 32 mL/min — ABNORMAL LOW (ref >60)
GFR, EST AFRICAN AMERICAN: 37 mL/min — AB (ref >60)
GLUCOSE: 142 mg/dL — AB (ref 70–99)
Potassium: 4.6 mmol/L (ref 3.5–5.1)
SODIUM: 133 mmol/L — AB (ref 135–145)
Total Bilirubin: 0.5 mg/dL (ref 0.3–1.2)
Total Protein: 7.8 g/dL (ref 6.5–8.1)

## 2018-08-04 LAB — LIPASE, BLOOD: Lipase: 21 U/L (ref 11–51)

## 2018-08-04 LAB — PREGNANCY, URINE: Preg Test, Ur: NEGATIVE

## 2018-08-04 IMAGING — CT CT ABDOMEN AND PELVIS WITHOUT CONTRAST
2 of 4 series · 17 of 46 positions shown, 19 images · non-contrast
Comparison: CT abdomen pelvis [DATE]

CLINICAL DATA: Right-sided abdominal pain and flank pain for 1
month.

EXAM:
CT ABDOMEN AND PELVIS WITHOUT CONTRAST
TECHNIQUE: Multidetector CT imaging of the abdomen and pelvis was performed
following the standard protocol without IV contrast.

[Series 2: axial st · axial · 0.98mm/px · z∈[-482,-32]mm · 14 of 98 slices shown, 16 images]
[im 4/98  soft-tissue]
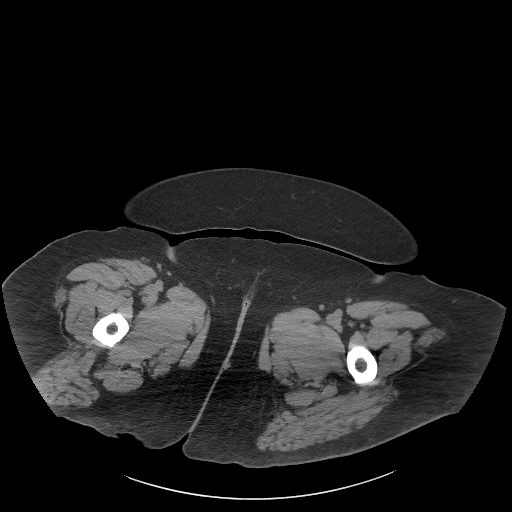
[im 4/98  bone]
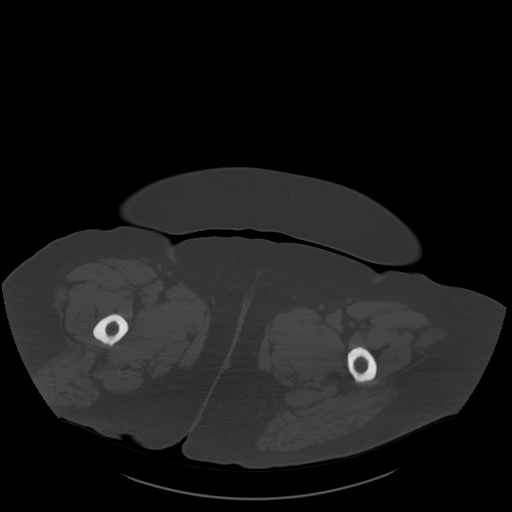
[im 12/98  soft-tissue]
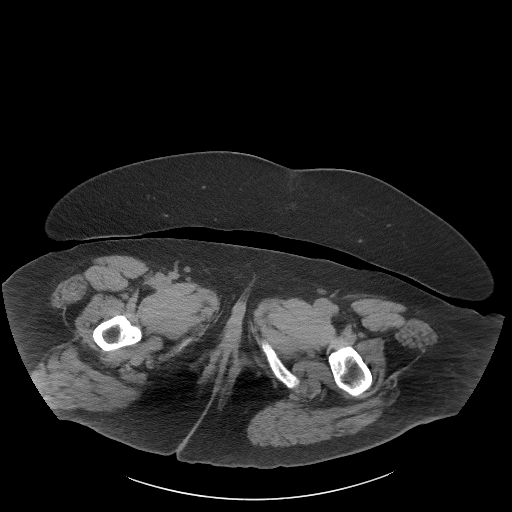
[im 20/98  soft-tissue]
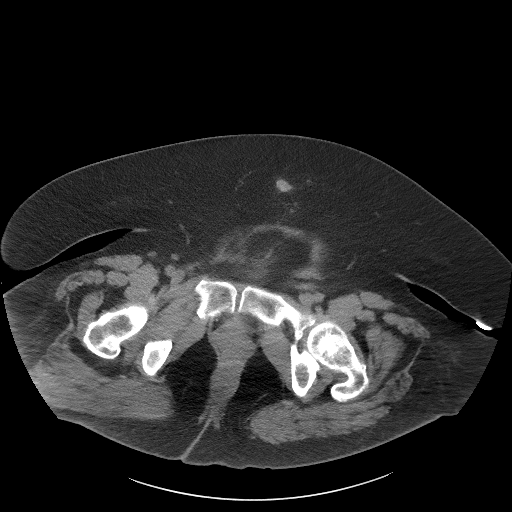
[im 28/98  soft-tissue]
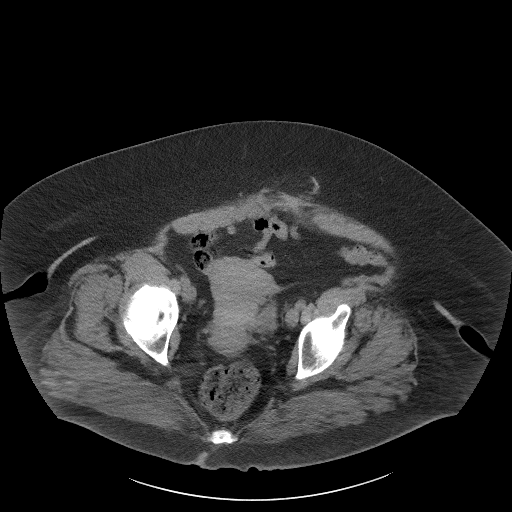
[im 32/98  soft-tissue]
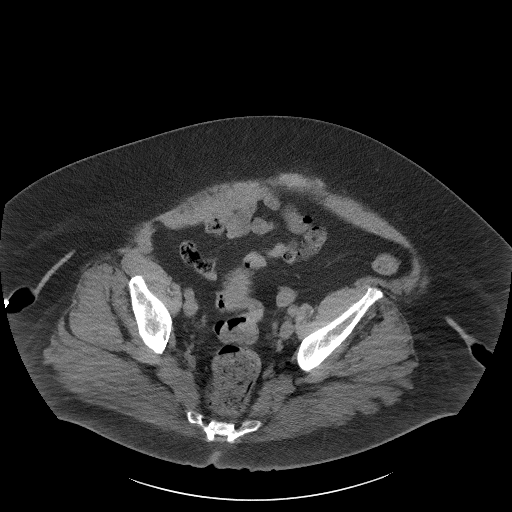
[im 39/98  soft-tissue]
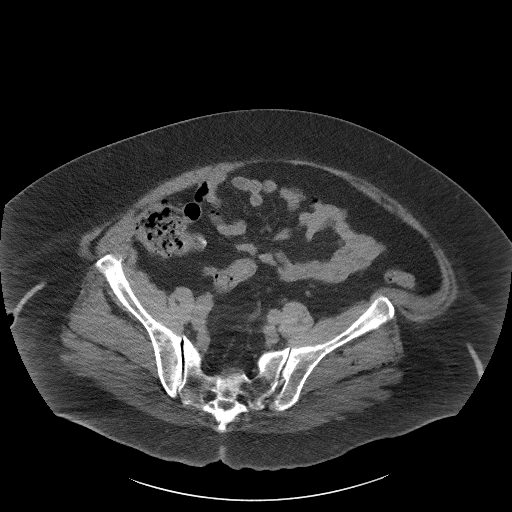
[im 47/98  soft-tissue]
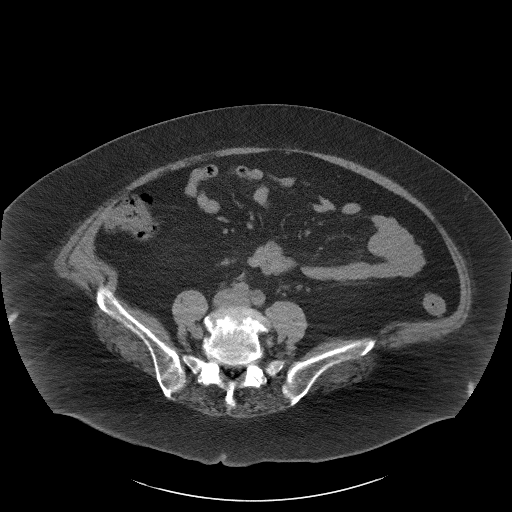
[im 51/98  soft-tissue]
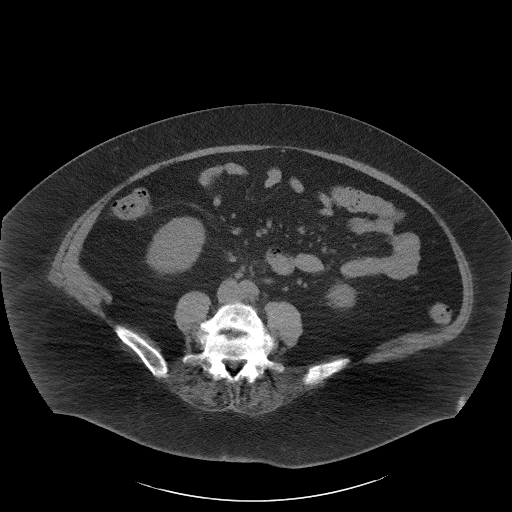
[im 59/98  soft-tissue]
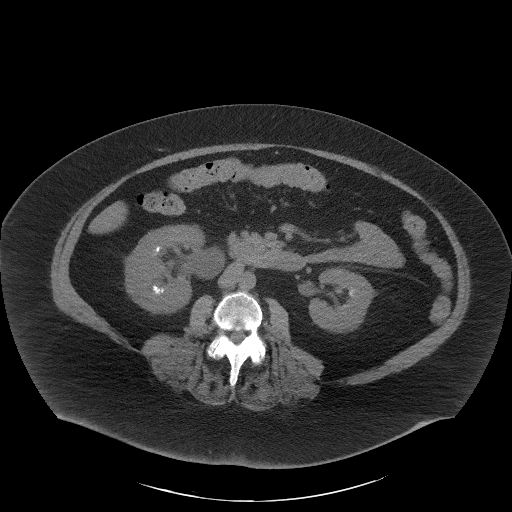
[im 59/98  bone]
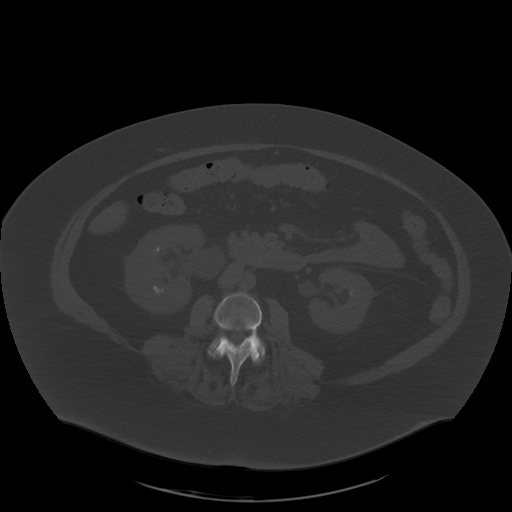
[im 66/98  soft-tissue]
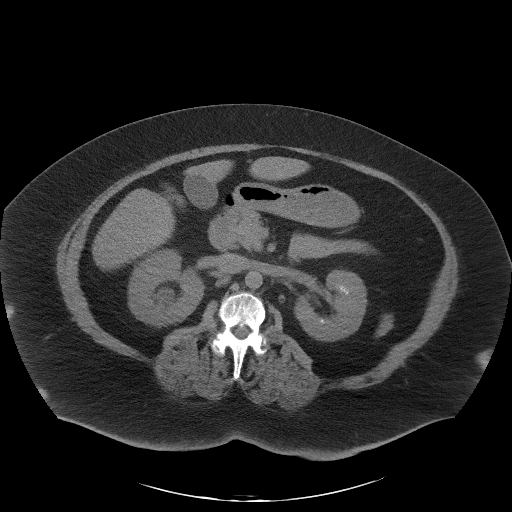
[im 74/98  soft-tissue]
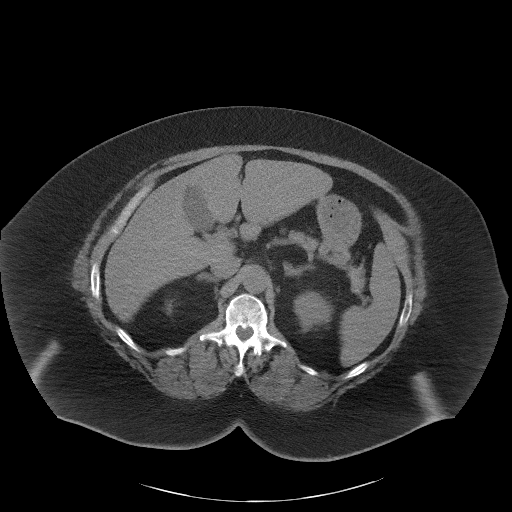
[im 78/98  soft-tissue]
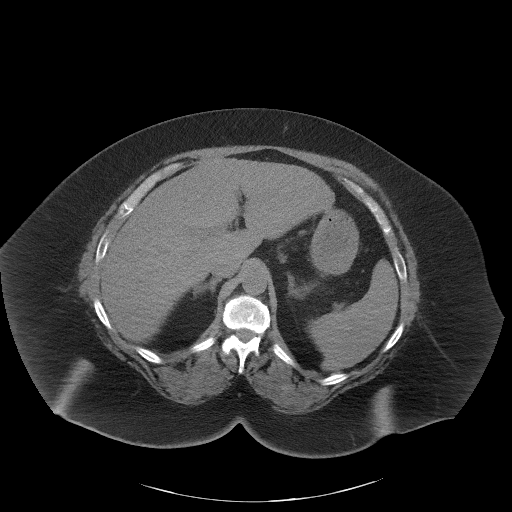
[im 86/98  soft-tissue]
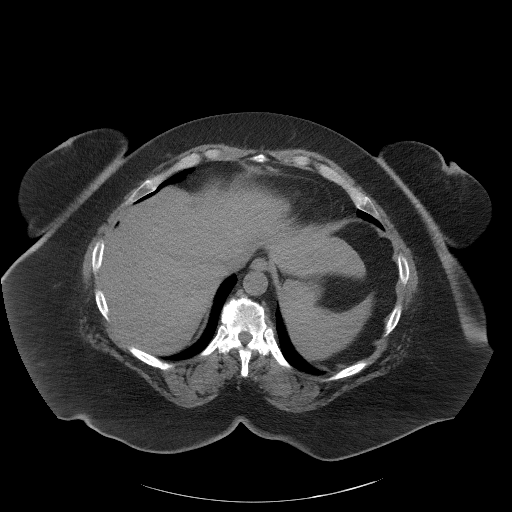
[im 94/98  soft-tissue]
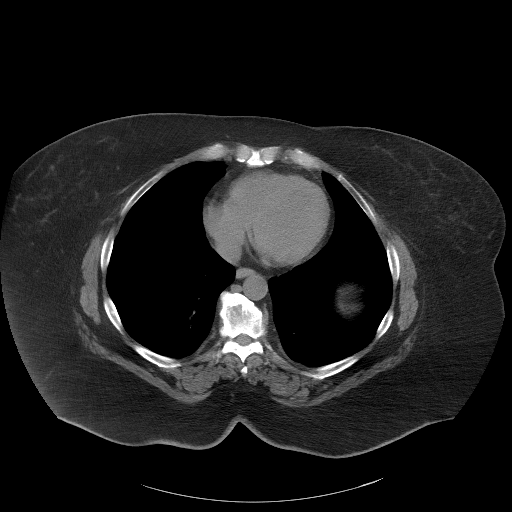

[Series 5: coronal st · coronal · 1.05mm/px · 3 of 102 slices shown]
[im 34/102  soft-tissue]
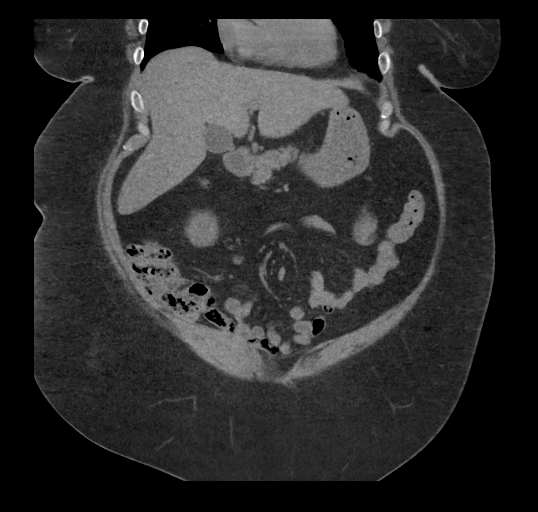
[im 45/102  soft-tissue]
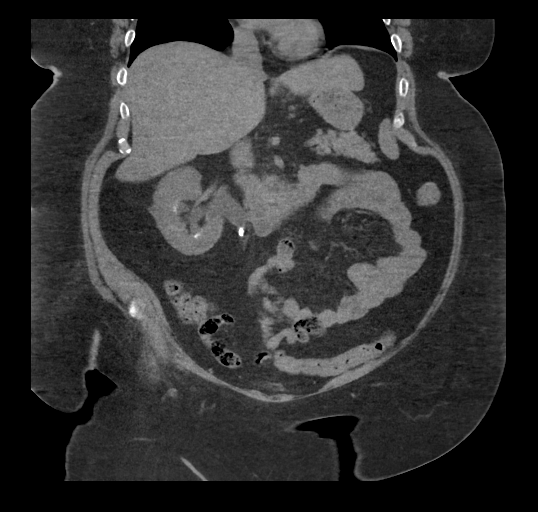
[im 57/102  soft-tissue]
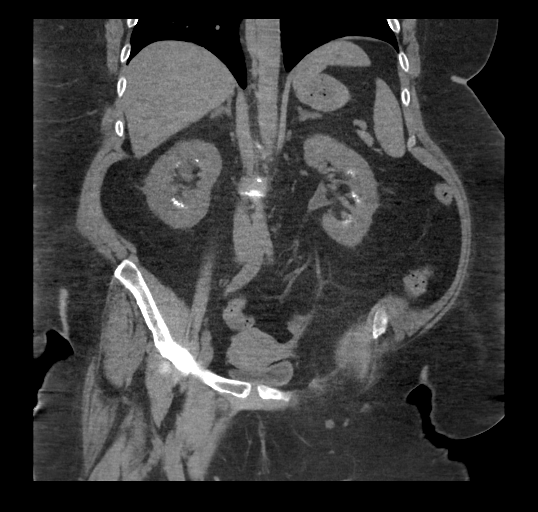

[17 of 46 positions shown; findings below may reference images not displayed]

FINDINGS: Lower chest: Normal heart size.  Lung bases are clear.

Hepatobiliary: Liver is diffusely low in attenuation compatible with
steatosis. Gallbladder is unremarkable. No intrahepatic or
extrahepatic biliary ductal dilatation.

Pancreas: Unremarkable

Spleen: Unremarkable

Adrenals/Urinary Tract: Normal adrenal glands. Bilateral medullary
nephrocalcinosis. There is a 7 mm stone within the proximal right
ureter (image 46; series 2). This results in mild-to-moderate right
hydronephrosis. No left-sided ureteral stones. Urinary bladder is
unremarkable.

Stomach/Bowel: No abnormal bowel wall thickening or evidence for
bowel obstruction. No free fluid or free intraperitoneal air. Normal
morphology of the stomach.

Vascular/Lymphatic: Normal caliber abdominal aorta. No
retroperitoneal lymphadenopathy.

Reproductive: Uterus and adnexal structures are unremarkable.

Other: None.

Musculoskeletal: Lumbar spine degenerative changes. Lower thoracic
spine degenerative changes. No aggressive or acute appearing osseous
lesions. Grade 1 anterolisthesis L4 and L5 with bilateral L4 pars
defects.
IMPRESSION: There is an obstructing stone (7 mm) within the proximal right
ureter resulting in mild to moderate right hydronephrosis.

Bilateral medullary nephrocalcinosis.

## 2018-08-04 MED ORDER — ONDANSETRON HCL 4 MG/2ML IJ SOLN: 4.0000 mg | Freq: Once | INTRAMUSCULAR | Status: DC

## 2018-08-04 MED ORDER — CEPHALEXIN 500 MG PO CAPS: 500.0000 mg | ORAL_CAPSULE | Freq: Two times a day (BID) | ORAL | 0 refills | Status: DC

## 2018-08-04 MED ORDER — SODIUM CHLORIDE 0.9% FLUSH
3.0000 mL | Freq: Once | INTRAVENOUS | Status: DC
  Filled 2018-08-04: qty 3

## 2018-08-04 MED ORDER — ONDANSETRON 4 MG PO TBDP: 4.0000 mg | ORAL_TABLET | Freq: Three times a day (TID) | ORAL | 0 refills | Status: DC | PRN

## 2018-08-04 MED ORDER — SODIUM CHLORIDE 0.9 % IV SOLN
1.0000 g | Freq: Once | INTRAVENOUS | Status: AC
  Administered 2018-08-04: 1 g via INTRAVENOUS
  Filled 2018-08-04: qty 10

## 2018-08-04 MED ORDER — TAMSULOSIN HCL 0.4 MG PO CAPS: 0.4000 mg | ORAL_CAPSULE | Freq: Every day | ORAL | 0 refills | Status: AC

## 2018-08-04 MED ORDER — SODIUM CHLORIDE 0.9 % IV BOLUS
1000.0000 mL | Freq: Once | INTRAVENOUS | Status: AC
  Administered 2018-08-04: 1000 mL via INTRAVENOUS

## 2018-08-04 MED ORDER — OXYCODONE-ACETAMINOPHEN 5-325 MG PO TABS
1.0000 | ORAL_TABLET | Freq: Once | ORAL | Status: AC
  Administered 2018-08-04: 1 via ORAL
  Filled 2018-08-04: qty 1

## 2018-08-04 MED ORDER — ONDANSETRON HCL 4 MG/2ML IJ SOLN
4.0000 mg | Freq: Once | INTRAMUSCULAR | Status: AC
  Administered 2018-08-04: 4 mg via INTRAVENOUS
  Filled 2018-08-04: qty 2

## 2018-08-04 NOTE — ED Notes (Signed)
Patient educated about not driving or performing other critical tasks (such as operating heavy machinery, caring for infant/toddler/child) due to sedative nature of narcotic medications received while in the ED.  Pt/caregiver verbalized understanding.   

## 2018-08-04 NOTE — ED Provider Notes (Signed)
[facility] EMERGENCY DEPARTMENT Provider Note   CSN: [id number] Arrival date & time: 08/04/18  1537    History   Chief Complaint Chief Complaint  Patient presents with  . Abdominal Pain    HPI Rhaelyn Giron is a 53 y.o. female with past medical history of schizophrenia, hypertension, GERD, diabetes, nephrolithiasis, presenting to the emergency department with gradually worsening right-sided stabbing abdominal pain that began 1 month ago.  Patient states symptoms initially came and went, however today became constant.  She has associated nausea and vomiting, that has been occurring throughout the month.  She has not treated her pain with any medications over-the-counter.  States she has not had a bowel movement in at least 1 week.  She denies fever, chills, diarrhea, urinary symptoms.     The history is provided by the patient.    Past Medical History:  Diagnosis Date  . Anxiety   . Asthma   . Depression   . Diabetes mellitus without complication (HCC)   . Frequency of urination   . GERD (gastroesophageal reflux disease)   . History of asthma    last episode yrs ago  . Hypertension   . OSA (obstructive sleep apnea)    pt had study done oct 2014--  schedule for cpap titrate after kidney stone surgery  . Schizophrenia (HCC)   . Ureteral calculi    BILATERAL  . Wears glasses     Patient Active Problem List   Diagnosis Date Noted  . Acute appendicitis 05/22/2014  . Ureteral calculus 05/02/2013    Past Surgical History:  Procedure Laterality Date  . CESAREAN SECTION  1991  &  2002   w/ bilateral tubal ligation in 2002  . CYSTOSCOPY WITH RETROGRADE PYELOGRAM, URETEROSCOPY AND STENT PLACEMENT Bilateral 05/02/2013   Procedure: CYSTOSCOPY WITH RETROGRADE PYELOGRAM, URETEROSCOPY ;  Surgeon: Valetta Fuller, MD;  Location: [facility];  Service: Urology;  Laterality: Bilateral;  . CYSTOSCOPY WITH STENT PLACEMENT Left 05/02/2013   Procedure: CYSTOSCOPY  WITH STENT PLACEMENT;  Surgeon: Valetta Fuller, MD;  Location: [facility];  Service: Urology;  Laterality: Left;  . HOLMIUM LASER APPLICATION Bilateral 05/02/2013   Procedure: HOLMIUM LASER APPLICATION;  Surgeon: Valetta Fuller, MD;  Location: [facility];  Service: Urology;  Laterality: Bilateral;  . LAPAROSCOPIC APPENDECTOMY N/A 05/22/2014   Procedure: APPENDECTOMY LAPAROSCOPIC;  Surgeon: Glenna Fellows, MD;  Location: [facility];  Service: General;  Laterality: N/A;  . TONSILLECTOMY  age 34     OB History   No obstetric history on file.      Home Medications    Prior to Admission medications   Medication Sig Start Date End Date Taking? Authorizing Provider  albuterol (VENTOLIN HFA) 108 (90 Base) MCG/ACT inhaler Inhale 1-2 puffs into the lungs every 6 (six) hours as needed for wheezing or shortness of breath.    [provider]  amitriptyline (ELAVIL) 50 MG tablet Take 50 mg by mouth at bedtime.    [provider]  amLODipine (NORVASC) 10 MG tablet Take 10 mg by mouth every morning.     [provider]  benzonatate (TESSALON) 100 MG capsule Take 1 capsule (100 mg total) by mouth 3 (three) times daily as needed for cough. 05/18/18   Luevenia Maxin, Mina A, PA-C  cephALEXin (KEFLEX) 500 MG capsule Take 1 capsule (500 mg total) by mouth 2 (two) times daily for 7 days. 08/04/18 08/11/18  , [place] N, PA-C  clonazePAM Scarlette Calico)  1 MG tablet Take 1 mg by mouth 2 (two) times daily as needed for anxiety.    [provider]  fluticasone (FLONASE) 50 MCG/ACT nasal spray Place 2 sprays into both nostrils daily. 05/18/18   Fawze, Mina A, PA-C  HYDROcodone-acetaminophen (NORCO/VICODIN) 5-325 MG per tablet Take 1 tablet by mouth every 6 (six) hours as needed. Patient not taking: Reported on 03/01/2016 02/12/15   Angelina Ok, MD  lisinopril-hydrochlorothiazide (PRINZIDE,ZESTORETIC) 20-25 MG per tablet Take 1 tablet by mouth daily.     [provider]  ondansetron (ZOFRAN ODT) 4 MG disintegrating tablet Take 1 tablet (4 mg total) by mouth every 8 (eight) hours as needed for nausea or vomiting. 08/04/18   , [place] N, PA-C  oxyCODONE (OXYCONTIN) 10 mg 12 hr tablet Take 10 mg by mouth daily as needed.    [provider]  pantoprazole (PROTONIX) 20 MG tablet Take 20 mg by mouth every morning.     [provider]  sertraline (ZOLOFT) 100 MG tablet Take 100 mg by mouth daily.    [provider]  tamsulosin (FLOMAX) 0.4 MG CAPS capsule Take 1 capsule (0.4 mg total) by mouth daily for 5 days. 08/04/18 08/09/18  , [place] N, PA-C    Family History History reviewed. No pertinent family history.  Social History Social History   Tobacco Use  . Smoking status: Never Smoker  . Smokeless tobacco: Never Used  Substance Use Topics  . Alcohol use: No  . Drug use: No     Allergies   Patient has no known allergies.   Review of Systems Review of Systems  Constitutional: Negative for fever.  Gastrointestinal: Positive for anal bleeding, constipation, nausea and vomiting. Negative for diarrhea.  Genitourinary: Negative for dysuria, flank pain and frequency.  All other systems reviewed and are negative.    Physical Exam Updated Vital Signs BP (!) 160/86   Pulse 99   Temp 98.5 F (36.9 C) (Oral)   Resp 18   Ht 5\' 11"  (1.803 m)   Wt (!) 152.9 kg   LMP 03/23/2014 Comment: patient states her periods are irregular  SpO2 98%   BMI 47.00 kg/m   Physical Exam Vitals signs and nursing note reviewed.  Constitutional:      General: She is not in acute distress.    Appearance: She is well-developed. She is not ill-appearing.     Comments: Morbidly obese  HENT:     Head: Normocephalic and atraumatic.  Eyes:     Conjunctiva/sclera: Conjunctivae normal.  Cardiovascular:     Rate and Rhythm: Normal rate and regular rhythm.  Pulmonary:     Effort: Pulmonary effort is normal. No  respiratory distress.     Breath sounds: Normal breath sounds.  Abdominal:     General: Bowel sounds are normal.     Palpations: Abdomen is soft.     Tenderness: There is abdominal tenderness in the right upper quadrant. There is no right CVA tenderness, left CVA tenderness, guarding or rebound.  Skin:    General: Skin is warm.  Neurological:     Mental Status: She is alert.  Psychiatric:        Behavior: Behavior normal.      ED Treatments / Results  Labs (all labs ordered are listed, but only abnormal results are displayed) Labs Reviewed  COMPREHENSIVE METABOLIC PANEL - Abnormal; Notable for the following components:      Result Value   Sodium 133 (*)    Glucose, Bld  142 (*)    BUN 32 (*)    Creatinine, Ser 1.82 (*)    Calcium 11.7 (*)    GFR calc non Af Amer 32 (*)    GFR calc Af Amer 37 (*)    All other components within normal limits  CBC - Abnormal; Notable for the following components:   Hemoglobin 10.0 (*)    HCT 34.8 (*)    MCV 75.8 (*)    MCH 21.8 (*)    MCHC 28.7 (*)    RDW 15.7 (*)    All other components within normal limits  URINALYSIS, ROUTINE W REFLEX MICROSCOPIC - Abnormal; Notable for the following components:   APPearance CLOUDY (*)    Hgb urine dipstick SMALL (*)    Nitrite POSITIVE (*)    Leukocytes,Ua MODERATE (*)    All other components within normal limits  URINALYSIS, MICROSCOPIC (REFLEX) - Abnormal; Notable for the following components:   Bacteria, UA MANY (*)    All other components within normal limits  CBG MONITORING, ED - Abnormal; Notable for the following components:   Glucose-Capillary 114 (*)    All other components within normal limits  URINE CULTURE  LIPASE, BLOOD  PREGNANCY, URINE    EKG None  Radiology Ct Abdomen Pelvis Wo Contrast  Result Date: 08/04/2018 CLINICAL DATA:  Right-sided abdominal pain and flank pain for 1 month. EXAM: CT ABDOMEN AND PELVIS WITHOUT CONTRAST TECHNIQUE: Multidetector CT imaging of the  abdomen and pelvis was performed following the standard protocol without IV contrast. COMPARISON:  CT abdomen pelvis 09/09/2017 FINDINGS: Lower chest: Normal heart size.  Lung bases are clear. Hepatobiliary: Liver is diffusely low in attenuation compatible with steatosis. Gallbladder is unremarkable. No intrahepatic or extrahepatic biliary ductal dilatation. Pancreas: Unremarkable Spleen: Unremarkable Adrenals/Urinary Tract: Normal adrenal glands. Bilateral medullary nephrocalcinosis. There is a 7 mm stone within the proximal right ureter (image 46; series 2). This results in mild-to-moderate right hydronephrosis. No left-sided ureteral stones. Urinary bladder is unremarkable. Stomach/Bowel: No abnormal bowel wall thickening or evidence for bowel obstruction. No free fluid or free intraperitoneal air. Normal morphology of the stomach. Vascular/Lymphatic: Normal caliber abdominal aorta. No retroperitoneal lymphadenopathy. Reproductive: Uterus and adnexal structures are unremarkable. Other: None. Musculoskeletal: Lumbar spine degenerative changes. Lower thoracic spine degenerative changes. No aggressive or acute appearing osseous lesions. Grade 1 anterolisthesis L4 and L5 with bilateral L4 pars defects. IMPRESSION: There is an obstructing stone (7 mm) within the proximal right ureter resulting in mild to moderate right hydronephrosis. Bilateral medullary nephrocalcinosis. Electronically Signed   By: Annia Belt M.D.   On: 08/04/2018 18:31    Procedures Procedures (including critical care time)  Medications Ordered in ED Medications  sodium chloride flush (NS) 0.9 % injection 3 mL (has no administration in time range)  ondansetron (ZOFRAN) injection 4 mg (has no administration in time range)  sodium chloride 0.9 % bolus 1,000 mL (1,000 mLs Intravenous New Bag/Given 08/04/18 1804)  ondansetron (ZOFRAN) injection 4 mg (4 mg Intravenous Given 08/04/18 1805)  cefTRIAXone (ROCEPHIN) 1 g in sodium chloride 0.9 %  100 mL IVPB (0 g Intravenous Stopped 08/04/18 2032)  oxyCODONE-acetaminophen (PERCOCET/ROXICET) 5-325 MG per tablet 1 tablet (1 tablet Oral Given 08/04/18 1927)     Initial Impression / Assessment and Plan / ED Course  I have reviewed the triage vital signs and the nursing notes.  Pertinent labs & imaging results that were available during my care of the patient were reviewed by me and considered in my medical  decision making (see chart for details).  Clinical Course as of Aug 04 2034  Sat Aug 04, 2018  1841 Pt discussed with Dr. Berneice HeinrichManny with urology: Outpt management with pain meds and abx. Iv rocephin, D/c with PO cipro or Keflex, tamsulosin. If can't maintain hydration or fevers [facility]. Call first thing Monday morning for follow up   [JR]    Clinical Course User Index [JR] , [place] N, PA-C       Patient presenting with 1 month of worsening right-sided abdominal pain with associated nausea and vomiting.  On exam, patient has right upper quadrant tenderness, no guarding or rebound.  Labs with AKI with a creatinine of 1.8.  No leukocytosis.  Urine with bacteriuria.  Imaging showing a 7 mm obstructing proximal right ureteral stone with mild to moderate hydronephrosis.  Patient was discussed with urologist on-call, Dr. Berneice HeinrichManny, who recommends if patient can tolerate p.o., is afebrile and not tachycardic and she can be discharged with outpatient follow-up on Monday.  Commence antibiotics, Flomax, and patient to call clinic on Monday for appointment.  Will likely need operative management of this.  Patient given IV dose of Rocephin in the ED, fluids, and antiemetics with improvement.  Patient tolerating p.o. fluids prior to discharge.  Strict return precautions discussed.  Patient verbalized understanding of plan.  Safe for discharge.  Discussed results, findings, treatment and follow up. Patient advised of return precautions. Patient verbalized understanding and agreed with plan.   [place] Controlled Substance reporting System queried. Pt with recurring prescriptions for oxycodone, last filled a 30-day supply on 08/02/2018.  Final Clinical Impressions(s) / ED Diagnoses   Final diagnoses:  Right ureteral calculus    ED Discharge Orders         Ordered    cephALEXin (KEFLEX) 500 MG capsule  2 times daily     08/04/18 2034    tamsulosin (FLOMAX) 0.4 MG CAPS capsule  Daily     08/04/18 2034    ondansetron (ZOFRAN ODT) 4 MG disintegrating tablet  Every 8 hours PRN     08/04/18 2034           , [place] N, PA-C 08/04/18 2037    Rolan BuccoBelfi, Melanie, MD 08/04/18 [phone]

## 2018-08-04 NOTE — ED Triage Notes (Signed)
PResents with right upper quadrant pain that began one month ago and today the pain is severe. she reports that the pain is a constant and stabbing. She reports that she has not eaten anything today, She also reports that she needs to lay down because she feels so terrible and weak.

## 2018-08-04 NOTE — ED Notes (Signed)
Patient Ambulatory to restroom without assistance

## 2018-08-04 NOTE — Discharge Instructions (Addendum)
Please read instructions below. Drink plenty of water. You can take your prescribed oxycodone for pain. You can take Zofran every 6 hours as needed for nausea. Take flomax once per day for bladder spasm. Starting tomorrow, take the antibiotics as prescribed. Call the urologist office first thing on Monday morning for follow-up to discuss further management of your stone. Return to the [facility] if your develop fever, uncontrollable vomiting, or worsening symptoms.

## 2018-08-07 LAB — URINE CULTURE: Culture: >=100000 — AB

## 2018-08-08 ENCOUNTER — Telehealth: Payer: Self-pay

## 2018-08-08 NOTE — Telephone Encounter (Signed)
Post ED Visit - Positive Culture Follow-up  Culture report reviewed by antimicrobial stewardship pharmacist: [facility] []  Enzo Bi, Pharm.D. []  Celedonio Miyamoto, Pharm.D., BCPS AQ-ID []  Garvin Fila, Pharm.D., BCPS []  Georgina Pillion, Pharm.D., BCPS []  [place], [address].D., BCPS, AAHIVP []  Estella Husk, Pharm.D., BCPS, AAHIVP [x]  Lysle Pearl, PharmD, BCPS []  Phillips Climes, PharmD, BCPS []  Agapito Games, PharmD, BCPS []  Verlan Friends, PharmD []  Mervyn Gay, PharmD, BCPS []  Vinnie Level, PharmD  [facility] []  Len Childs, PharmD []  Greer Pickerel, PharmD []  Adalberto Cole, PharmD []  Perlie Gold, Rph []  Lonell Face) Jean Rosenthal, PharmD []  Earl Many, PharmD []  Junita Push, PharmD []  Dorna Leitz, PharmD []  Terrilee Files, PharmD []  Lynann Beaver, PharmD []  Keturah Barre, PharmD []  Loralee Pacas, PharmD []  Bernadene Person, PharmD   Positive urine culture Treated with Cephalexin, organism sensitive to the same and no further patient follow-up is required at this time.  Jerry Caras 08/08/2018, 8:21 AM

## 2018-08-10 ENCOUNTER — Other Ambulatory Visit: Payer: Self-pay | Admitting: Urology

## 2018-08-10 ENCOUNTER — Emergency Department (HOSPITAL_COMMUNITY): Payer: 59

## 2018-08-10 ENCOUNTER — Encounter (HOSPITAL_COMMUNITY)
Admission: EM | Disposition: A | Discharge status: Home / Self Care | Payer: Self-pay | Source: Home / Self Care | Attending: Emergency Medicine

## 2018-08-10 ENCOUNTER — Observation Stay (HOSPITAL_COMMUNITY): Payer: 59 | Admitting: Certified Registered Nurse Anesthetist

## 2018-08-10 ENCOUNTER — Encounter (HOSPITAL_COMMUNITY): Payer: Self-pay | Admitting: Emergency Medicine

## 2018-08-10 ENCOUNTER — Observation Stay (HOSPITAL_COMMUNITY): Payer: 59

## 2018-08-10 ENCOUNTER — Other Ambulatory Visit: Payer: Self-pay

## 2018-08-10 ENCOUNTER — Observation Stay (HOSPITAL_COMMUNITY)
Admission: EM | Admit: 2018-08-10 | Discharge: 2018-08-12 | Disposition: A | Discharge status: Home / Self Care | Payer: 59 | Attending: Family Medicine | Admitting: Family Medicine

## 2018-08-10 DIAGNOSIS — N178 Other acute kidney failure: Secondary | ICD-10-CM | POA: Diagnosis not present

## 2018-08-10 DIAGNOSIS — J45909 Unspecified asthma, uncomplicated: Secondary | ICD-10-CM | POA: Insufficient documentation

## 2018-08-10 DIAGNOSIS — E860 Dehydration: Secondary | ICD-10-CM | POA: Insufficient documentation

## 2018-08-10 DIAGNOSIS — F209 Schizophrenia, unspecified: Secondary | ICD-10-CM | POA: Diagnosis not present

## 2018-08-10 DIAGNOSIS — R6520 Severe sepsis without septic shock: Secondary | ICD-10-CM

## 2018-08-10 DIAGNOSIS — F329 Major depressive disorder, single episode, unspecified: Secondary | ICD-10-CM | POA: Insufficient documentation

## 2018-08-10 DIAGNOSIS — N179 Acute kidney failure, unspecified: Secondary | ICD-10-CM

## 2018-08-10 DIAGNOSIS — I10 Essential (primary) hypertension: Secondary | ICD-10-CM | POA: Diagnosis not present

## 2018-08-10 DIAGNOSIS — S0993XA Unspecified injury of face, initial encounter: Secondary | ICD-10-CM | POA: Diagnosis not present

## 2018-08-10 DIAGNOSIS — W1789XA Other fall from one level to another, initial encounter: Secondary | ICD-10-CM | POA: Insufficient documentation

## 2018-08-10 DIAGNOSIS — E119 Type 2 diabetes mellitus without complications: Secondary | ICD-10-CM | POA: Diagnosis not present

## 2018-08-10 DIAGNOSIS — Z79899 Other long term (current) drug therapy: Secondary | ICD-10-CM | POA: Insufficient documentation

## 2018-08-10 DIAGNOSIS — I959 Hypotension, unspecified: Secondary | ICD-10-CM

## 2018-08-10 DIAGNOSIS — M1712 Unilateral primary osteoarthritis, left knee: Secondary | ICD-10-CM | POA: Diagnosis not present

## 2018-08-10 DIAGNOSIS — A419 Sepsis, unspecified organism: Secondary | ICD-10-CM

## 2018-08-10 DIAGNOSIS — K5900 Constipation, unspecified: Secondary | ICD-10-CM | POA: Insufficient documentation

## 2018-08-10 DIAGNOSIS — G4733 Obstructive sleep apnea (adult) (pediatric): Secondary | ICD-10-CM | POA: Diagnosis not present

## 2018-08-10 DIAGNOSIS — F419 Anxiety disorder, unspecified: Secondary | ICD-10-CM | POA: Diagnosis not present

## 2018-08-10 DIAGNOSIS — S0990XA Unspecified injury of head, initial encounter: Secondary | ICD-10-CM | POA: Insufficient documentation

## 2018-08-10 DIAGNOSIS — D509 Iron deficiency anemia, unspecified: Secondary | ICD-10-CM | POA: Insufficient documentation

## 2018-08-10 DIAGNOSIS — Y92531 Health care provider office as the place of occurrence of the external cause: Secondary | ICD-10-CM | POA: Insufficient documentation

## 2018-08-10 DIAGNOSIS — N200 Calculus of kidney: Secondary | ICD-10-CM

## 2018-08-10 DIAGNOSIS — N132 Hydronephrosis with renal and ureteral calculous obstruction: Secondary | ICD-10-CM | POA: Insufficient documentation

## 2018-08-10 DIAGNOSIS — N201 Calculus of ureter: Secondary | ICD-10-CM

## 2018-08-10 DIAGNOSIS — Z87442 Personal history of urinary calculi: Secondary | ICD-10-CM | POA: Insufficient documentation

## 2018-08-10 DIAGNOSIS — S8992XA Unspecified injury of left lower leg, initial encounter: Secondary | ICD-10-CM | POA: Diagnosis not present

## 2018-08-10 HISTORY — PX: CYSTOSCOPY W/ URETERAL STENT PLACEMENT: SHX1429

## 2018-08-10 LAB — CBC WITH DIFFERENTIAL/PLATELET
Abs Immature Granulocytes: 0.08 10*3/uL — ABNORMAL HIGH (ref 0.00–0.07)
Basophils Absolute: 0 10*3/uL (ref 0.0–0.1)
Basophils Relative: 0 %
Eosinophils Absolute: 0.1 10*3/uL (ref 0.0–0.5)
Eosinophils Relative: 1 %
HEMATOCRIT: 30.8 % — AB (ref 36.0–46.0)
HEMOGLOBIN: 8.7 g/dL — AB (ref 12.0–15.0)
Immature Granulocytes: 1 %
LYMPHS PCT: 14 %
Lymphs Abs: 1.6 10*3/uL (ref 0.7–4.0)
MCH: 21.8 pg — ABNORMAL LOW (ref 26.0–34.0)
MCHC: 28.2 g/dL — ABNORMAL LOW (ref 30.0–36.0)
MCV: 77.2 fL — ABNORMAL LOW (ref 80.0–100.0)
Monocytes Absolute: 1 10*3/uL (ref 0.1–1.0)
Monocytes Relative: 9 %
Neutro Abs: 8.3 10*3/uL — ABNORMAL HIGH (ref 1.7–7.7)
Neutrophils Relative %: 75 %
Platelets: 293 10*3/uL (ref 150–400)
RBC: 3.99 MIL/uL (ref 3.87–5.11)
RDW: 15.5 % (ref 11.5–15.5)
WBC: 11.1 10*3/uL — ABNORMAL HIGH (ref 4.0–10.5)
nRBC: 0 % (ref 0.0–0.2)

## 2018-08-10 LAB — I-STAT TROPONIN, ED: Troponin i, poc: 0 ng/mL (ref 0.00–0.08)

## 2018-08-10 LAB — COMPREHENSIVE METABOLIC PANEL
ALT: 20 U/L (ref 0–44)
AST: 23 U/L (ref 15–41)
Albumin: 3.2 g/dL — ABNORMAL LOW (ref 3.5–5.0)
Alkaline Phosphatase: 77 U/L (ref 38–126)
Anion gap: 9 (ref 5–15)
BILIRUBIN TOTAL: 0.6 mg/dL (ref 0.3–1.2)
BUN: 23 mg/dL — ABNORMAL HIGH (ref 6–20)
CALCIUM: 11.2 mg/dL — AB (ref 8.9–10.3)
CO2: 21 mmol/L — ABNORMAL LOW (ref 22–32)
Chloride: 102 mmol/L (ref 98–111)
Creatinine, Ser: 2.43 mg/dL — ABNORMAL HIGH (ref 0.44–1.00)
GFR calc Af Amer: 26 mL/min — ABNORMAL LOW (ref >60)
GFR calc non Af Amer: 22 mL/min — ABNORMAL LOW (ref >60)
Glucose, Bld: 214 mg/dL — ABNORMAL HIGH (ref 70–99)
Potassium: 4.8 mmol/L (ref 3.5–5.1)
Sodium: 132 mmol/L — ABNORMAL LOW (ref 135–145)
Total Protein: 7.7 g/dL (ref 6.5–8.1)

## 2018-08-10 LAB — LACTIC ACID, PLASMA
Lactic Acid, Venous: 2.7 mmol/L (ref 0.5–1.9)
Lactic Acid, Venous: 2.7 mmol/L (ref 0.5–1.9)
Lactic Acid, Venous: 1.8 mmol/L (ref 0.5–1.9)

## 2018-08-10 LAB — LIPASE, BLOOD: Lipase: 26 U/L (ref 11–51)

## 2018-08-10 IMAGING — CT CT CERVICAL SPINE WITHOUT CONTRAST
5 of 10 series · 10 of 33 positions shown, 11 images · non-contrast
Comparison: None.

CLINICAL DATA: Fell and hit head with left-sided swelling

EXAM:
CT HEAD WITHOUT CONTRAST
CT MAXILLOFACIAL WITHOUT CONTRAST
CT CERVICAL SPINE WITHOUT CONTRAST
TECHNIQUE: Multidetector CT imaging of the head, cervical spine, and
maxillofacial structures were performed using the standard protocol
without intravenous contrast. Multiplanar CT image reconstructions
of the cervical spine and maxillofacial structures were also
generated.

[Series 6: c spine soft · axial · 0.31mm/px · z∈[-270,-202]mm · 2 of 104 slices shown]
[im 35/104  soft-tissue]
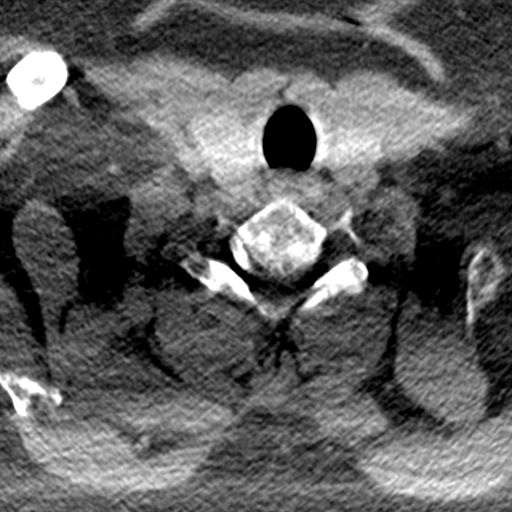
[im 69/104  soft-tissue]
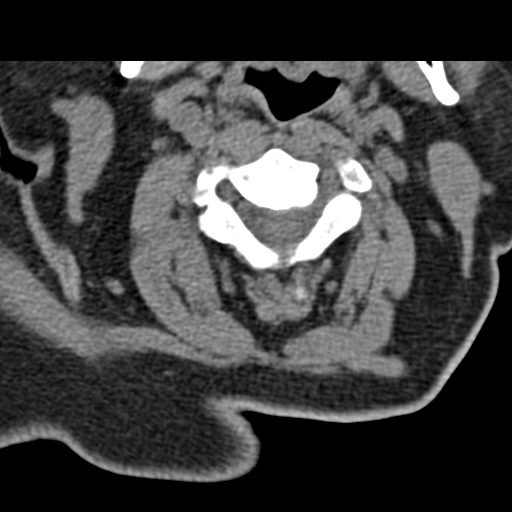

[Series 7: max soft · axial · 0.39mm/px · z∈[-200,-138]mm · 2 of 94 slices shown]
[im 32/94  soft-tissue]
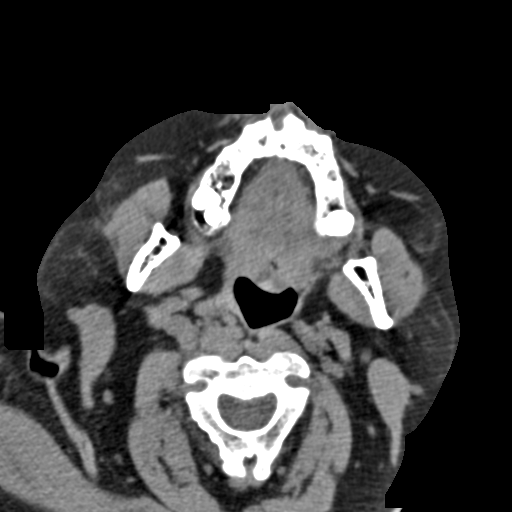
[im 63/94  soft-tissue]
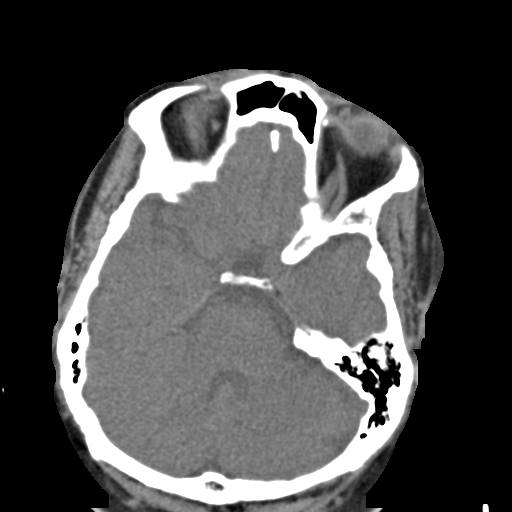

[Series 19: orthogonal axials · axial · 0.23mm/px · z∈[-316,-195]mm · 3 of 125 slices shown, 4 images]
[im 32/125  soft-tissue]
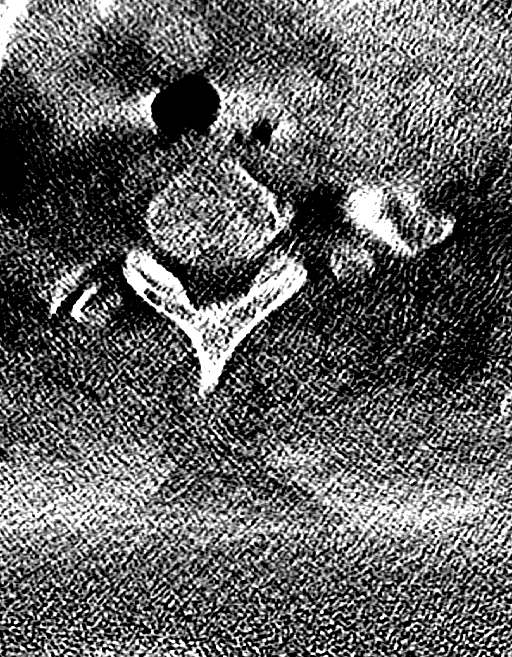
[im 32/125  bone]
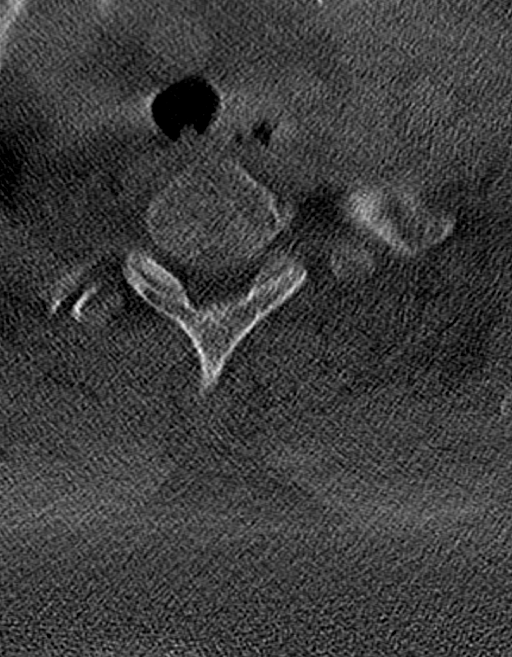
[im 63/125  bone]
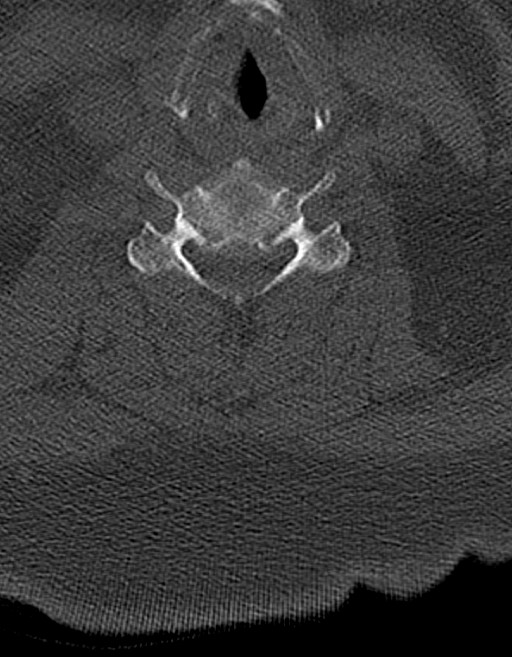
[im 94/125  bone]
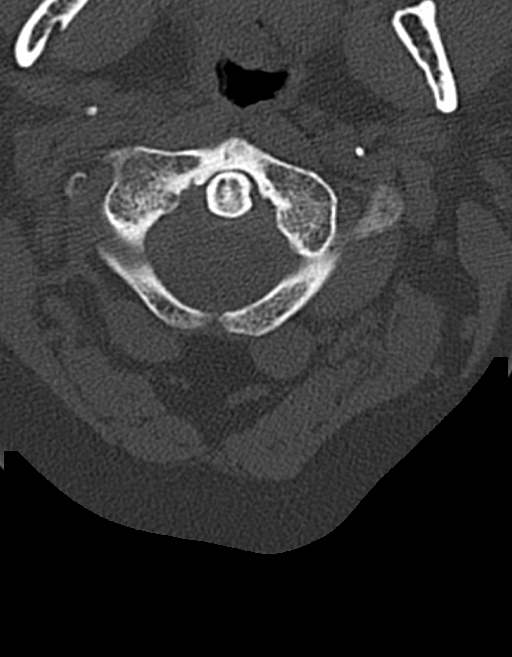

[Series 21: sagittal bone · sagittal · 0.30mm/px · 2 of 61 slices shown]
[im 21/61  bone]
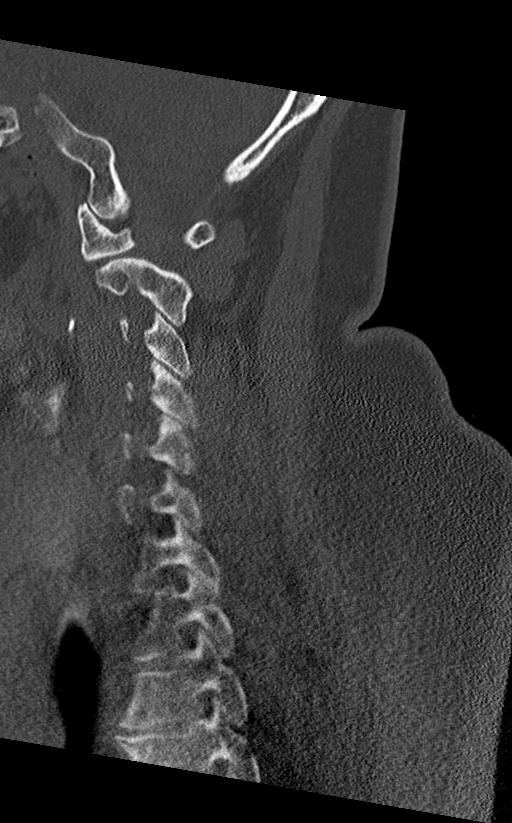
[im 41/61  bone]
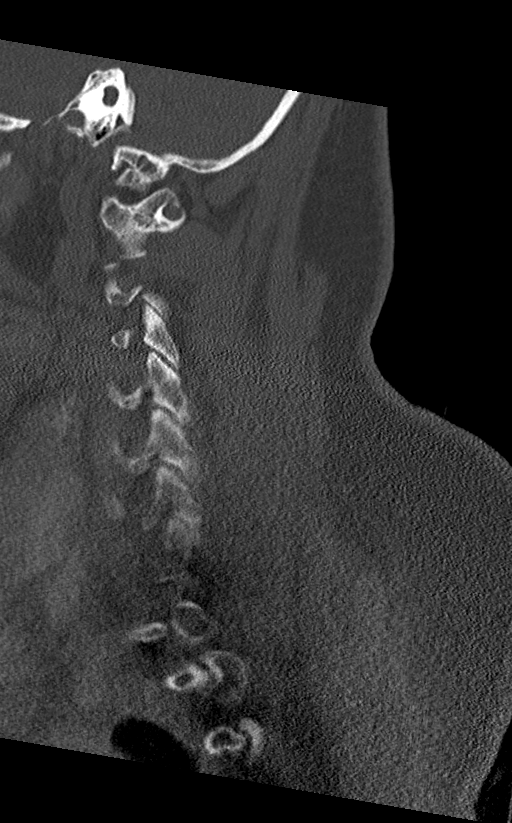

[Series 22: coronal soft · coronal · 0.38mm/px · 1 of 94 slices shown]
[im 47/94  bone]
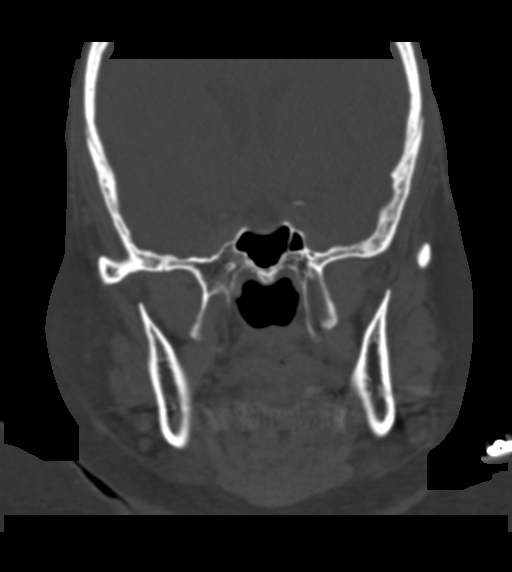

[10 of 33 positions shown; findings below may reference images not displayed]

FINDINGS: CT HEAD FINDINGS

Brain: No acute territorial infarction, hemorrhage, or intracranial
mass. Mild atrophy. Normal ventricle size.

Vascular: No hyperdense vessel.  No unexpected calcification

Skull: No depressed skull fracture

Other: None

CT MAXILLOFACIAL FINDINGS

Osseous: No acute nasal bone fracture. Bilateral mandibular heads
are normally position. No mandibular fracture. Mastoid air cells are
clear. Pterygoid plates and zygomatic arches are intact. Prominent
root lucency right maxillary central incisor.

Orbits: Negative. No traumatic or inflammatory finding.

Sinuses: Clear.

Soft tissues: Negative.

CT CERVICAL SPINE FINDINGS

Alignment: Straightening of the cervical spine. No subluxation.
Facet alignment maintained

Skull base and vertebrae: No acute fracture. No primary bone lesion
or focal pathologic process.

Soft tissues and spinal canal: No prevertebral fluid or swelling. No
visible canal hematoma.

Disc levels:  Mild diffuse degenerative changes C4 through C7.

Upper chest: Negative.

Other: None
IMPRESSION: 1. No CT evidence for acute intracranial abnormality.  Mild atrophy
2. No acute facial bone fracture
3. Straightening of the cervical spine without acute osseous
abnormality.

## 2018-08-10 IMAGING — CR CHEST  1 VIEW
1 series · 1 of 1 positions shown · non-contrast
Comparison: Radiographs [DATE] and [DATE].

CLINICAL DATA: Fall at doctor's office with head injury. Knee pain.

EXAM:
CHEST  1 VIEW

[x chest ap]
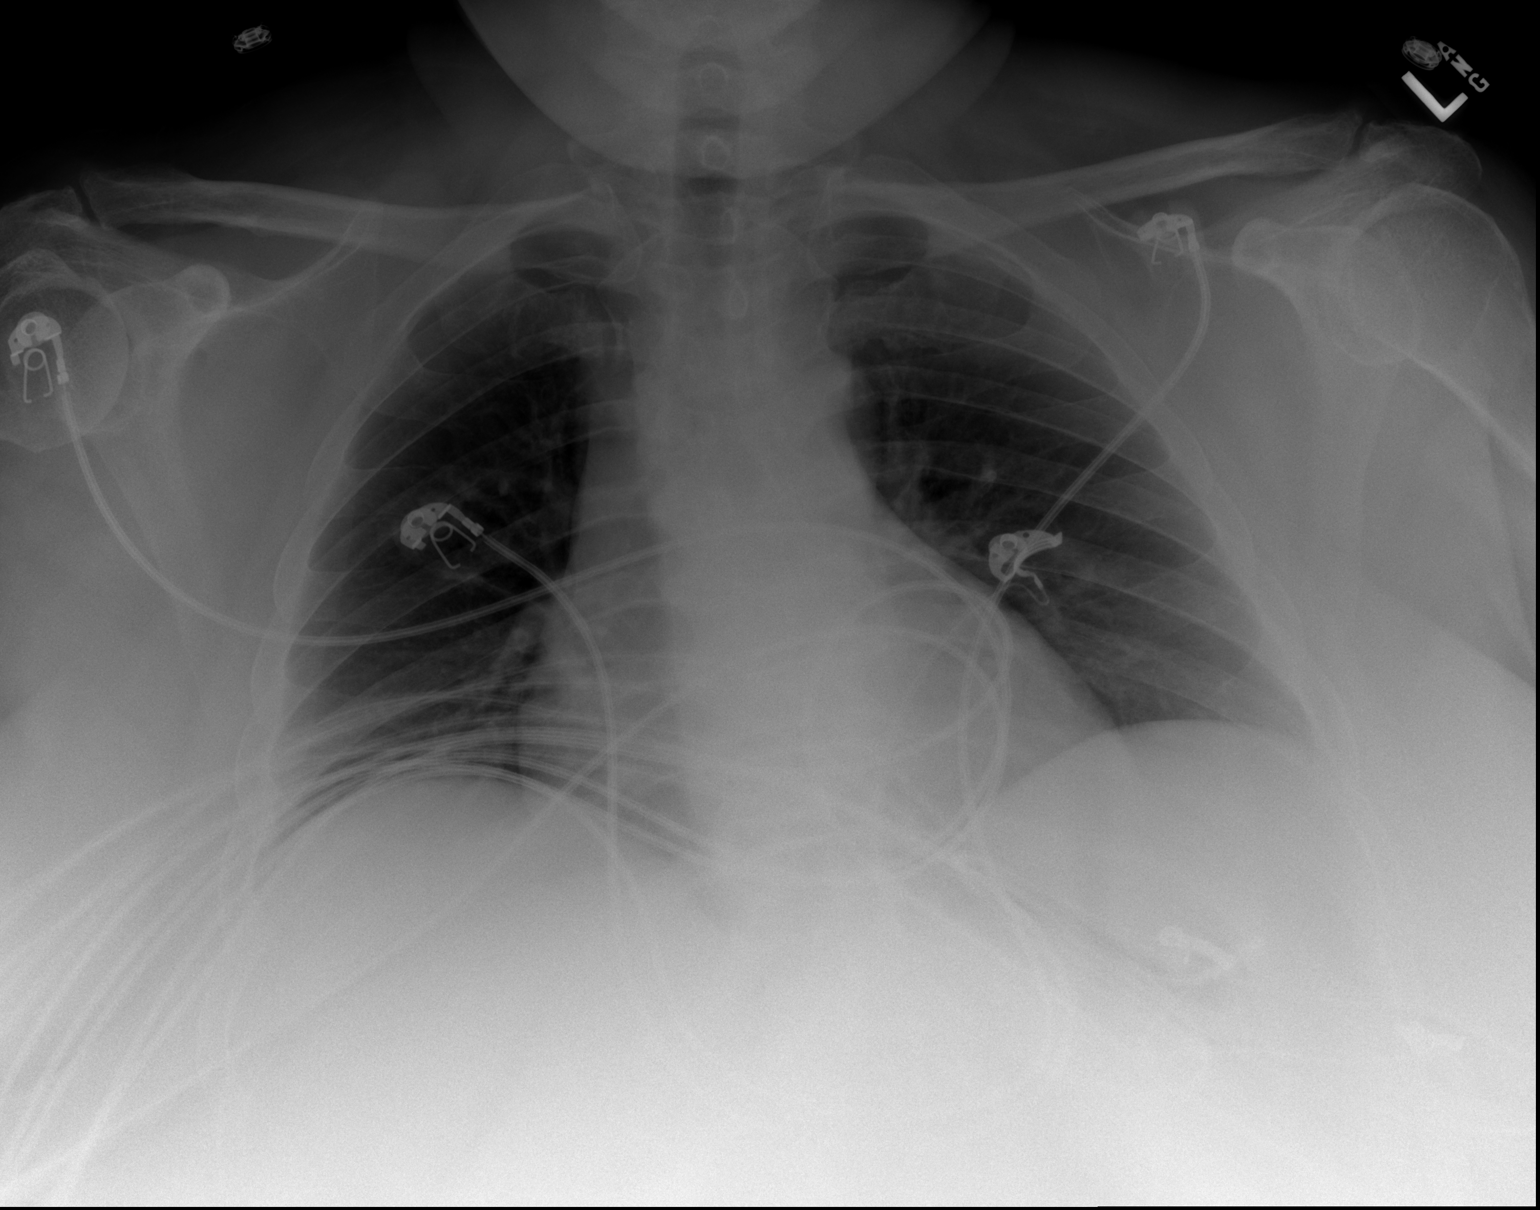

[1 of 1 positions shown; findings below may reference images not displayed]

FINDINGS: [RN] hours. Lesser degree of inspiration with patchy right basilar
pulmonary opacity, probably atelectasis. There is no edema,
confluent airspace opacity, pleural effusion or pneumothorax. No
acute osseous findings are evident. Telemetry leads overlie the
chest.
IMPRESSION: Patchy right basilar opacity likely represents atelectasis given the
lesser degree of inspiration. No acute findings.

## 2018-08-10 IMAGING — CR LEFT KNEE - COMPLETE 4+ VIEW
4 series · 4 of 4 positions shown · non-contrast
Comparison: Radiographs [DATE] and [DATE].

CLINICAL DATA: Fall at doctor's office with head injury. Knee pain.

EXAM:
LEFT KNEE - COMPLETE 4+ VIEW

[t knee ap left]
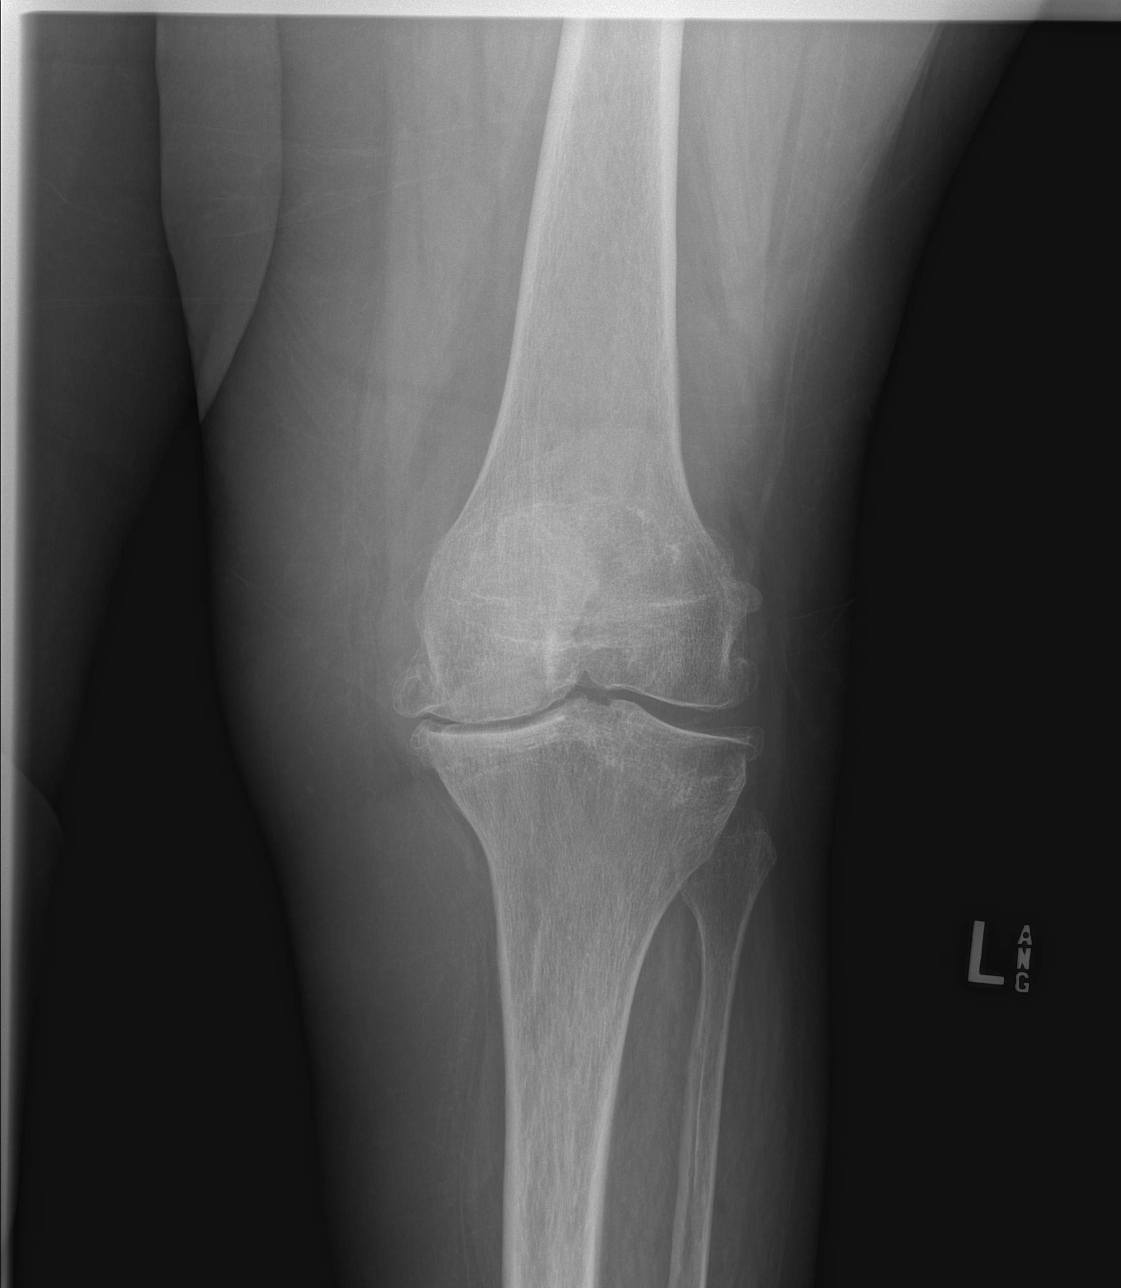

[t knee obl left (1 of 2)]
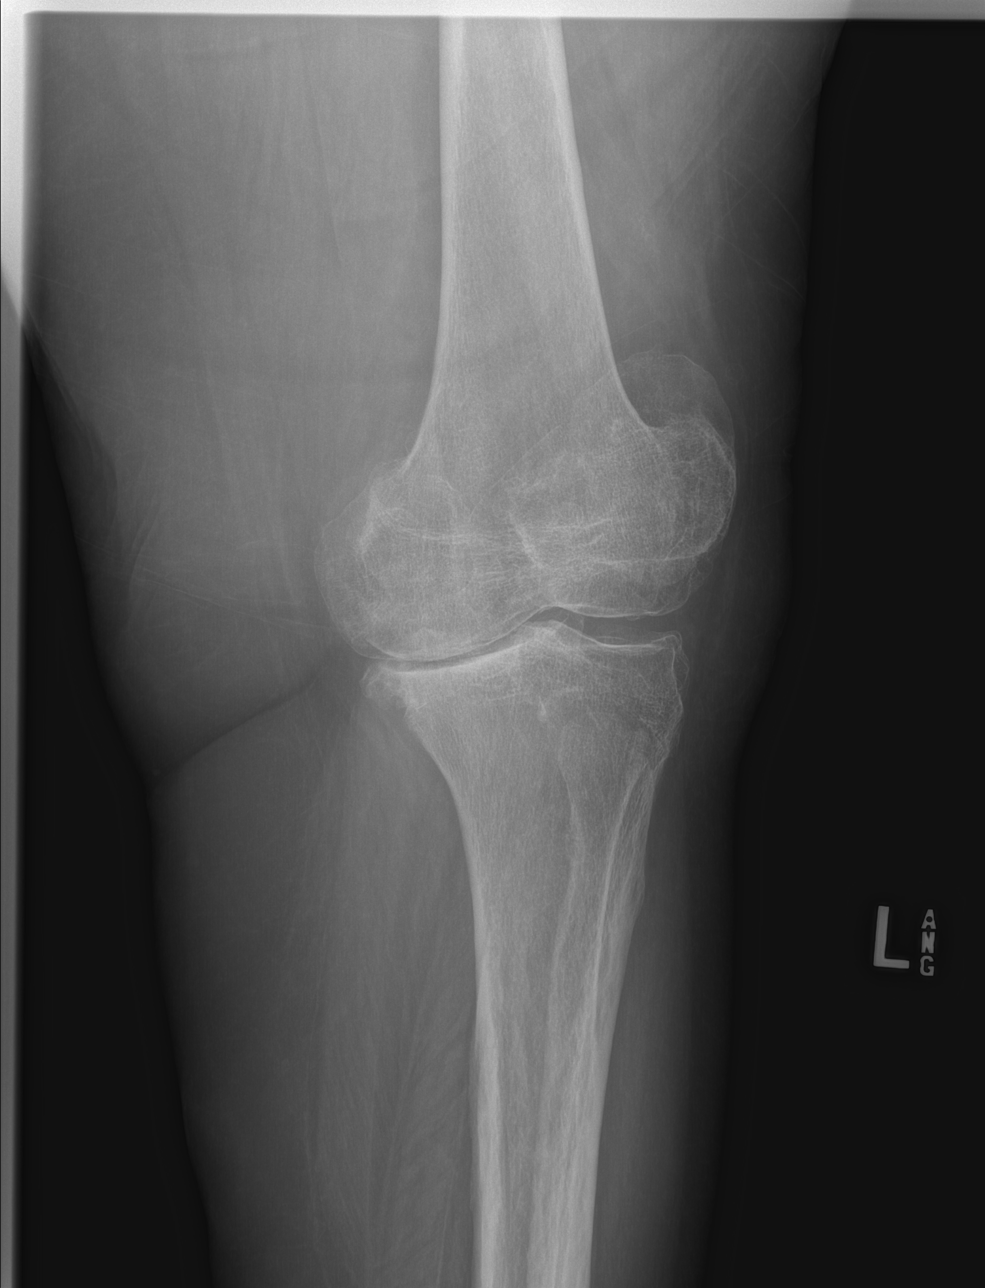

[t knee obl left (2 of 2)]
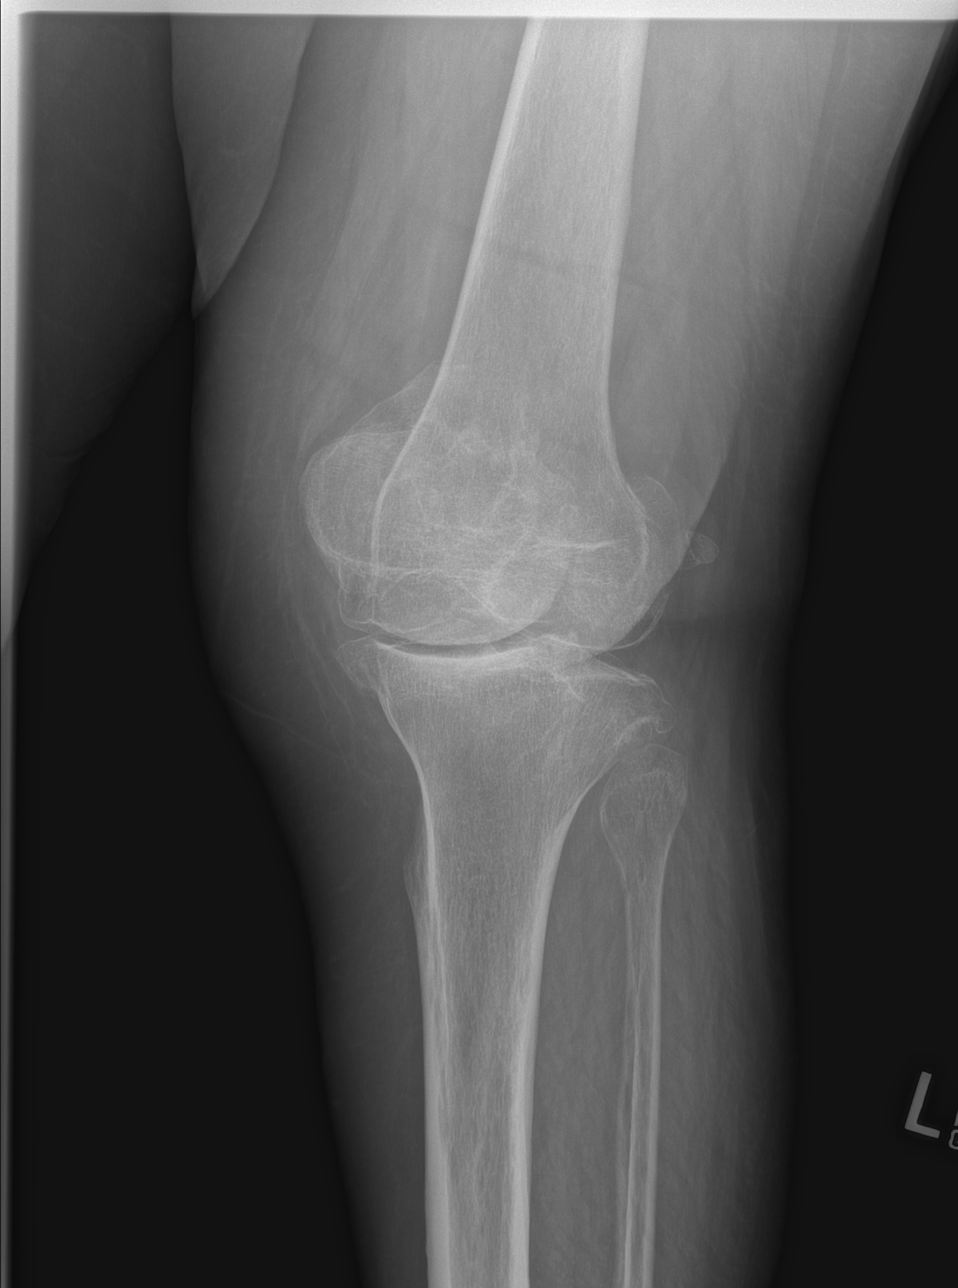

[x knee lat left]
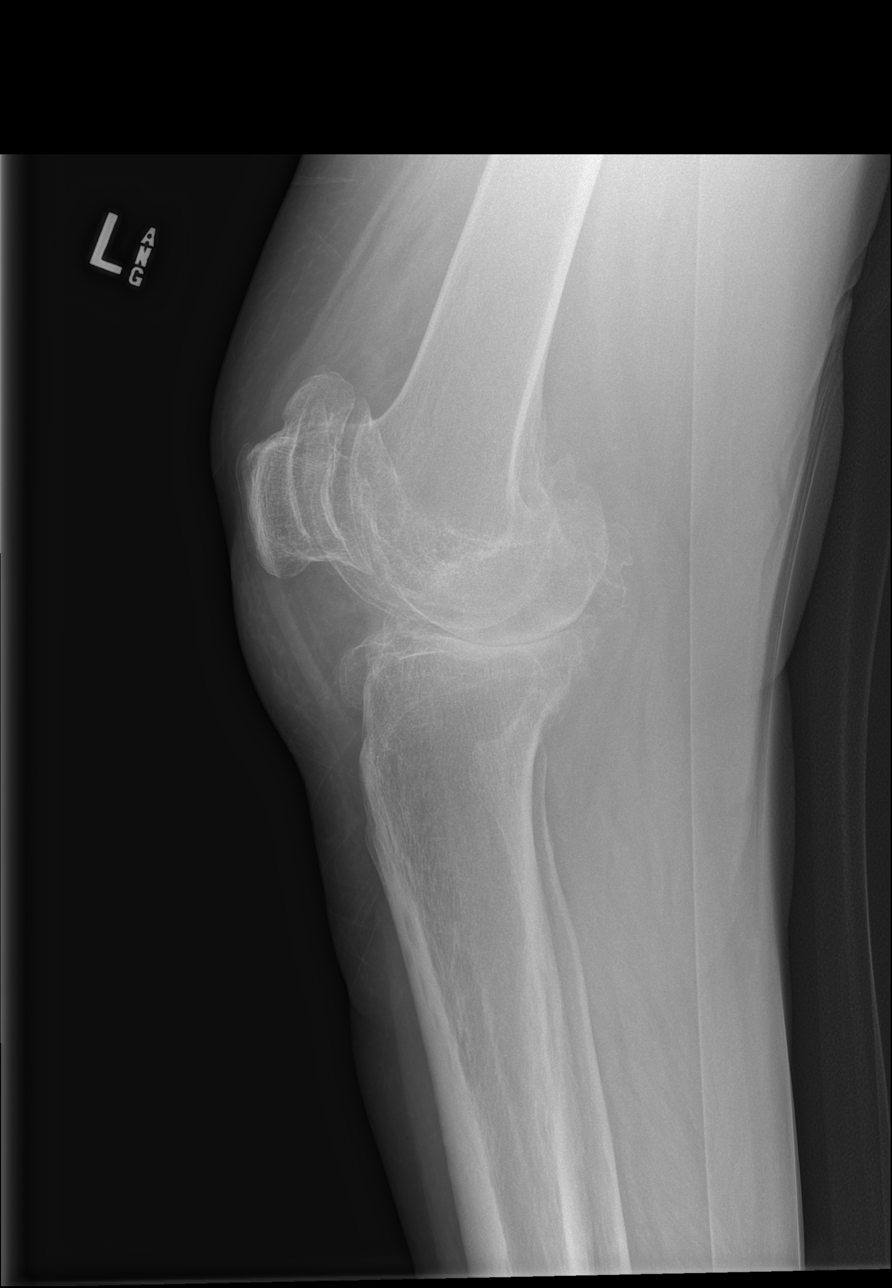

[4 of 4 positions shown; findings below may reference images not displayed]

FINDINGS: The mineralization and alignment are normal. There is no evidence of
acute fracture or dislocation. Age advanced tricompartmental
osteoarthritis is again noted with severe joint space narrowing and
osteophyte formation in the medial and patellofemoral compartments.
No significant joint effusion.
IMPRESSION: No acute osseous findings. Advanced tricompartmental osteoarthritis.

## 2018-08-10 IMAGING — CR ABDOMEN - 1 VIEW
2 series · 2 of 2 positions shown · non-contrast
Comparison: CT abdomen [DATE].

CLINICAL DATA: Fall at doctor's office with head injury. Knee pain.
Known kidney stone.

EXAM:
ABDOMEN - 1 VIEW

[t abdomen supine (1 of 2)]
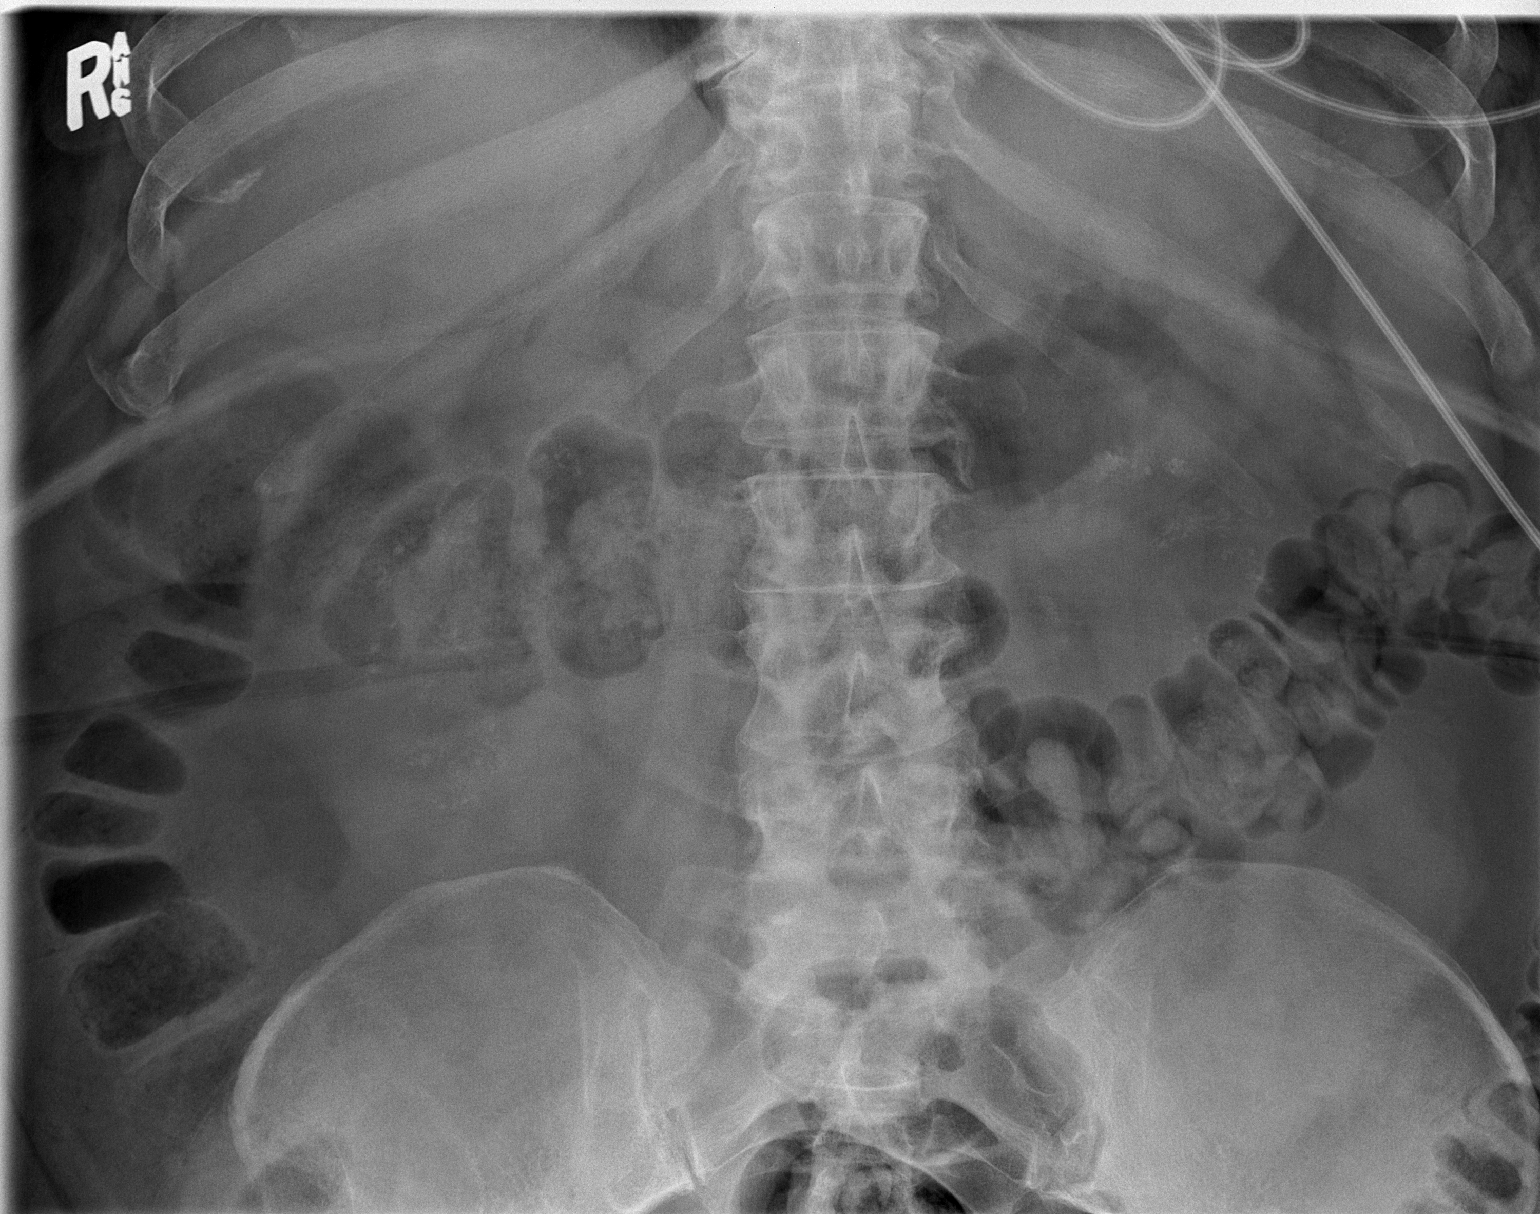

[t abdomen supine (2 of 2)]
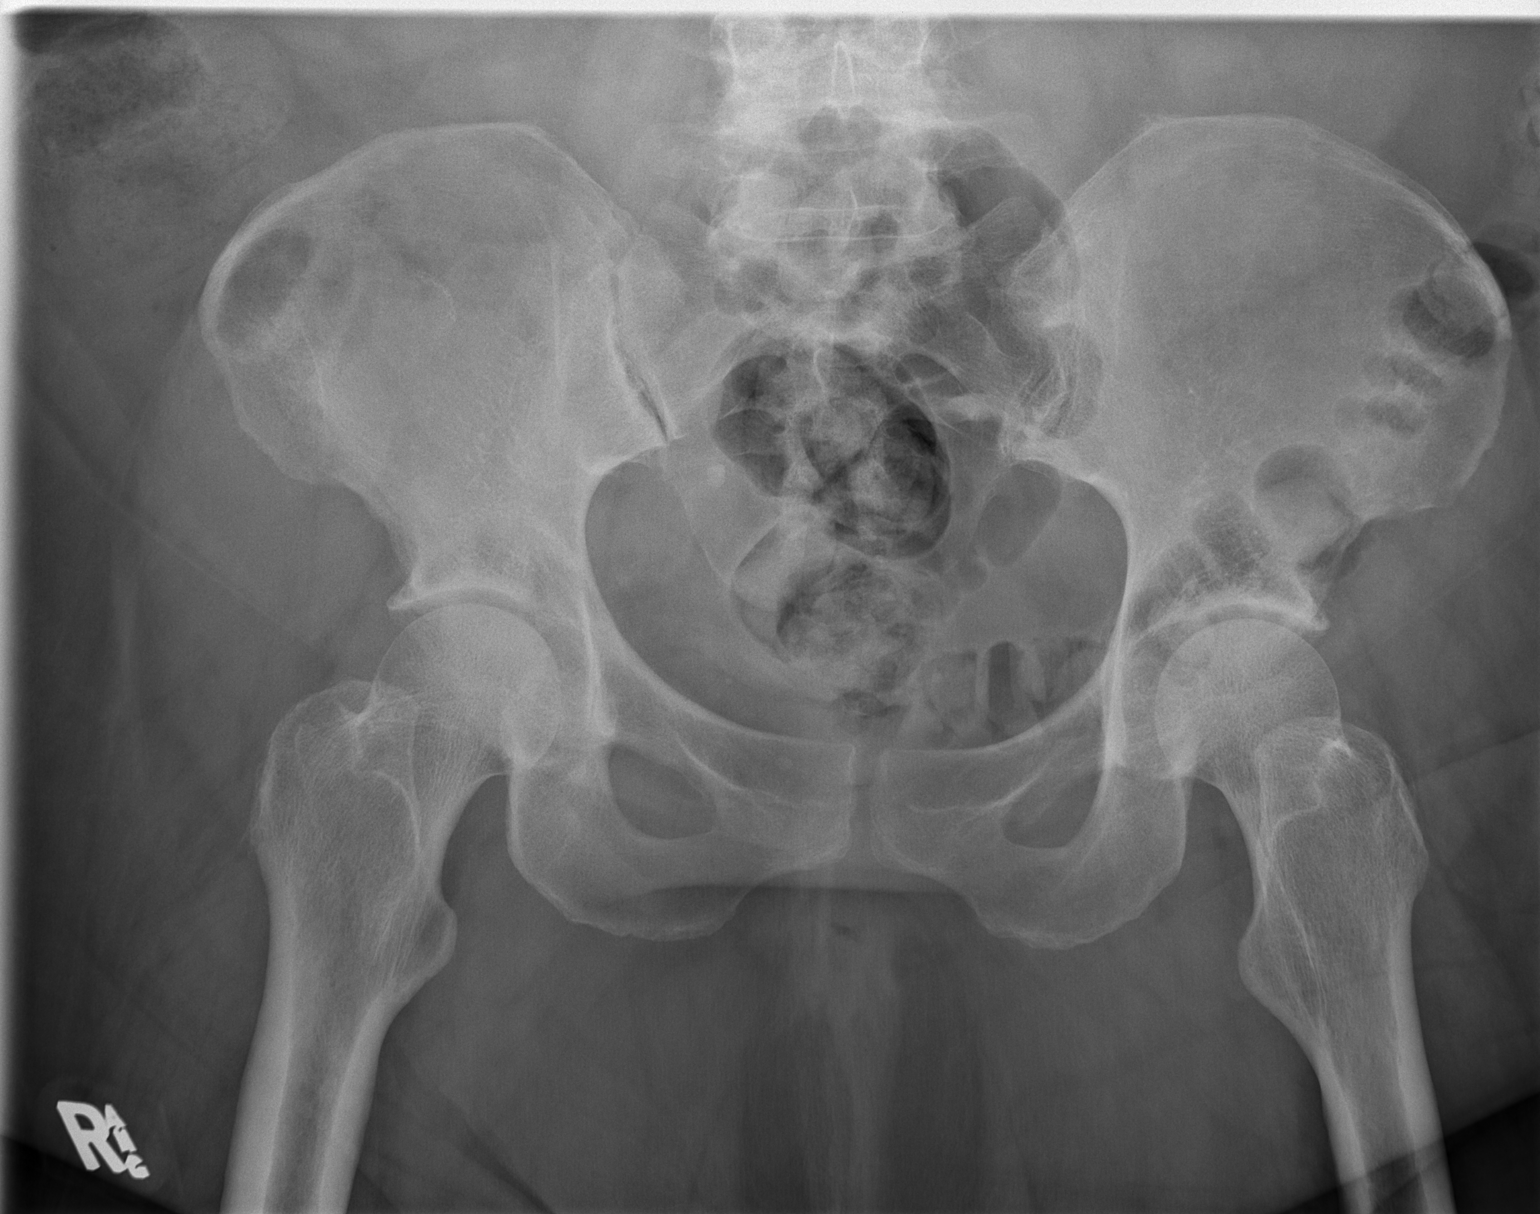

[2 of 2 positions shown; findings below may reference images not displayed]

FINDINGS: The bowel gas pattern is normal. Bilateral nephrolithiasis again
noted. The previously demonstrated proximal right ureteral calculus
is likely positioned over the lower sacrum. No acute osseous
findings. Mild lumbar spondylosis.
IMPRESSION: Apparent interval distal passage of the proximal right ureteral
calculus, now overlapping the sacrum, consistent with location in
the distal ureter. Bilateral nephrolithiasis.

## 2018-08-10 SURGERY — CYSTOSCOPY, WITH RETROGRADE PYELOGRAM AND URETERAL STENT INSERTION
Anesthesia: General | Laterality: Right

## 2018-08-10 MED ORDER — PHENYLEPHRINE 40 MCG/ML (10ML) SYRINGE FOR IV PUSH (FOR BLOOD PRESSURE SUPPORT)
PREFILLED_SYRINGE | INTRAVENOUS | Status: DC | PRN
  Administered 2018-08-10 (×3): 120 ug via INTRAVENOUS
  Administered 2018-08-10: 80 ug via INTRAVENOUS
  Administered 2018-08-10: 120 ug via INTRAVENOUS
  Administered 2018-08-10: 80 ug via INTRAVENOUS
  Administered 2018-08-10: 120 ug via INTRAVENOUS
  Administered 2018-08-10: 80 ug via INTRAVENOUS

## 2018-08-10 MED ORDER — LIDOCAINE 2% (20 MG/ML) 5 ML SYRINGE
INTRAMUSCULAR | Status: AC
  Filled 2018-08-10: qty 5

## 2018-08-10 MED ORDER — HYDROCODONE-ACETAMINOPHEN 5-325 MG PO TABS: 1.0000 | ORAL_TABLET | Freq: Four times a day (QID) | ORAL | Status: DC | PRN

## 2018-08-10 MED ORDER — MIDAZOLAM HCL 2 MG/2ML IJ SOLN
INTRAMUSCULAR | Status: AC
  Filled 2018-08-10: qty 2

## 2018-08-10 MED ORDER — IOHEXOL 300 MG/ML  SOLN
INTRAMUSCULAR | Status: DC | PRN
  Administered 2018-08-10: 10 mL via URETHRAL

## 2018-08-10 MED ORDER — PROMETHAZINE HCL 25 MG/ML IJ SOLN: 6.2500 mg | INTRAMUSCULAR | Status: DC | PRN

## 2018-08-10 MED ORDER — OXYCODONE HCL 5 MG PO TABS: 5.0000 mg | ORAL_TABLET | Freq: Once | ORAL | Status: DC | PRN

## 2018-08-10 MED ORDER — DEXAMETHASONE SODIUM PHOSPHATE 4 MG/ML IJ SOLN
INTRAMUSCULAR | Status: DC | PRN
  Administered 2018-08-10: 10 mg via INTRAVENOUS

## 2018-08-10 MED ORDER — OXYCODONE HCL 5 MG/5ML PO SOLN: 5.0000 mg | Freq: Once | ORAL | Status: DC | PRN

## 2018-08-10 MED ORDER — SODIUM CHLORIDE 0.9 % IV SOLN
1.0000 g | Freq: Once | INTRAVENOUS | Status: AC
  Administered 2018-08-10: 1 g via INTRAVENOUS
  Filled 2018-08-10: qty 10

## 2018-08-10 MED ORDER — ALBUTEROL SULFATE (2.5 MG/3ML) 0.083% IN NEBU: 3.0000 mL | INHALATION_SOLUTION | Freq: Four times a day (QID) | RESPIRATORY_TRACT | Status: DC | PRN

## 2018-08-10 MED ORDER — ONDANSETRON HCL 4 MG/2ML IJ SOLN
INTRAMUSCULAR | Status: AC
  Filled 2018-08-10: qty 2

## 2018-08-10 MED ORDER — ENOXAPARIN SODIUM 80 MG/0.8ML ~~LOC~~ SOLN
70.0000 mg | SUBCUTANEOUS | Status: DC
  Administered 2018-08-11 – 2018-08-12 (×2): 70 mg via SUBCUTANEOUS
  Filled 2018-08-10 (×2): qty 0.8

## 2018-08-10 MED ORDER — MIDAZOLAM HCL 5 MG/5ML IJ SOLN
INTRAMUSCULAR | Status: DC | PRN
  Administered 2018-08-10: 1 mg via INTRAVENOUS

## 2018-08-10 MED ORDER — ONDANSETRON HCL 4 MG/2ML IJ SOLN
INTRAMUSCULAR | Status: DC | PRN
  Administered 2018-08-10: 4 mg via INTRAVENOUS

## 2018-08-10 MED ORDER — SUCCINYLCHOLINE CHLORIDE 200 MG/10ML IV SOSY
PREFILLED_SYRINGE | INTRAVENOUS | Status: AC
  Filled 2018-08-10: qty 10

## 2018-08-10 MED ORDER — LIDOCAINE 2% (20 MG/ML) 5 ML SYRINGE
INTRAMUSCULAR | Status: DC | PRN
  Administered 2018-08-10: 60 mg via INTRAVENOUS

## 2018-08-10 MED ORDER — PROPOFOL 10 MG/ML IV BOLUS
INTRAVENOUS | Status: AC
  Filled 2018-08-10: qty 20

## 2018-08-10 MED ORDER — SODIUM CHLORIDE 0.9 % IV BOLUS
1000.0000 mL | Freq: Once | INTRAVENOUS | Status: AC
  Administered 2018-08-10: 1000 mL via INTRAVENOUS

## 2018-08-10 MED ORDER — ONDANSETRON HCL 4 MG/2ML IJ SOLN
4.0000 mg | Freq: Once | INTRAMUSCULAR | Status: AC
  Administered 2018-08-10: 4 mg via INTRAVENOUS
  Filled 2018-08-10: qty 2

## 2018-08-10 MED ORDER — POLYETHYLENE GLYCOL 3350 17 G PO PACK
17.0000 g | PACK | Freq: Every day | ORAL | Status: DC
  Administered 2018-08-11 – 2018-08-12 (×2): 17 g via ORAL
  Filled 2018-08-10 (×2): qty 1

## 2018-08-10 MED ORDER — PHENYLEPHRINE 40 MCG/ML (10ML) SYRINGE FOR IV PUSH (FOR BLOOD PRESSURE SUPPORT)
PREFILLED_SYRINGE | INTRAVENOUS | Status: AC
  Filled 2018-08-10: qty 10

## 2018-08-10 MED ORDER — FENTANYL CITRATE (PF) 100 MCG/2ML IJ SOLN: 25.0000 ug | INTRAMUSCULAR | Status: DC | PRN

## 2018-08-10 MED ORDER — SODIUM CHLORIDE 0.9 % IV SOLN
2.0000 g | INTRAVENOUS | Status: DC
  Administered 2018-08-11 – 2018-08-12 (×2): 2 g via INTRAVENOUS
  Filled 2018-08-10 (×2): qty 2

## 2018-08-10 MED ORDER — CLONAZEPAM 1 MG PO TABS: 1.0000 mg | ORAL_TABLET | Freq: Two times a day (BID) | ORAL | Status: DC | PRN

## 2018-08-10 MED ORDER — ACETAMINOPHEN 325 MG PO TABS: 650.0000 mg | ORAL_TABLET | Freq: Four times a day (QID) | ORAL | Status: DC | PRN

## 2018-08-10 MED ORDER — AMITRIPTYLINE HCL 25 MG PO TABS
50.0000 mg | ORAL_TABLET | Freq: Every day | ORAL | Status: DC
  Administered 2018-08-10 – 2018-08-11 (×2): 50 mg via ORAL
  Filled 2018-08-10 (×2): qty 2

## 2018-08-10 MED ORDER — FENTANYL CITRATE (PF) 100 MCG/2ML IJ SOLN
INTRAMUSCULAR | Status: AC
  Filled 2018-08-10: qty 2

## 2018-08-10 MED ORDER — ONDANSETRON 4 MG PO TBDP: 4.0000 mg | ORAL_TABLET | Freq: Three times a day (TID) | ORAL | Status: DC | PRN

## 2018-08-10 MED ORDER — ACETAMINOPHEN 10 MG/ML IV SOLN: 1000.0000 mg | Freq: Once | INTRAVENOUS | Status: DC | PRN

## 2018-08-10 MED ORDER — SOD CITRATE-CITRIC ACID 500-334 MG/5ML PO SOLN
ORAL | Status: AC
  Filled 2018-08-10: qty 15

## 2018-08-10 MED ORDER — LACTATED RINGERS IV SOLN
INTRAVENOUS | Status: DC | PRN
  Administered 2018-08-10 (×2): via INTRAVENOUS

## 2018-08-10 MED ORDER — SODIUM CHLORIDE 0.9 % IV SOLN
INTRAVENOUS | Status: DC
  Administered 2018-08-10 – 2018-08-11 (×2): via INTRAVENOUS

## 2018-08-10 MED ORDER — SODIUM CHLORIDE 0.9 % IR SOLN
Status: DC | PRN
  Administered 2018-08-10: 3000 mL via INTRAVESICAL

## 2018-08-10 MED ORDER — SERTRALINE HCL 100 MG PO TABS
100.0000 mg | ORAL_TABLET | Freq: Every day | ORAL | Status: DC
  Administered 2018-08-11 – 2018-08-12 (×2): 100 mg via ORAL
  Filled 2018-08-10 (×2): qty 1

## 2018-08-10 MED ORDER — PROPOFOL 10 MG/ML IV BOLUS
INTRAVENOUS | Status: DC | PRN
  Administered 2018-08-10: 60 mg via INTRAVENOUS
  Administered 2018-08-10: 200 mg via INTRAVENOUS

## 2018-08-10 MED ORDER — ACETAMINOPHEN 650 MG RE SUPP: 650.0000 mg | Freq: Four times a day (QID) | RECTAL | Status: DC | PRN

## 2018-08-10 MED ORDER — DEXAMETHASONE SODIUM PHOSPHATE 10 MG/ML IJ SOLN
INTRAMUSCULAR | Status: AC
  Filled 2018-08-10: qty 1

## 2018-08-10 MED ORDER — FENTANYL CITRATE (PF) 100 MCG/2ML IJ SOLN
INTRAMUSCULAR | Status: DC | PRN
  Administered 2018-08-10 (×2): 50 ug via INTRAVENOUS

## 2018-08-10 MED ORDER — MORPHINE SULFATE (PF) 4 MG/ML IV SOLN
4.0000 mg | Freq: Once | INTRAVENOUS | Status: AC
  Administered 2018-08-10: 4 mg via INTRAVENOUS
  Filled 2018-08-10: qty 1

## 2018-08-10 MED ORDER — SUCCINYLCHOLINE CHLORIDE 200 MG/10ML IV SOSY
PREFILLED_SYRINGE | INTRAVENOUS | Status: DC | PRN
  Administered 2018-08-10: 100 mg via INTRAVENOUS

## 2018-08-10 SURGICAL SUPPLY — 19 items
BAG URINE LEG 500ML (DRAIN) ×3 IMPLANT
BAG URO CATCHER STRL LF (MISCELLANEOUS) ×3 IMPLANT
BASKET ZERO TIP NITINOL 2.4FR (BASKET) IMPLANT
CATH FOLEY 2WAY SLVR  5CC 16FR (CATHETERS) ×2
CATH FOLEY 2WAY SLVR 5CC 16FR (CATHETERS) ×1 IMPLANT
CATH INTERMIT  6FR 70CM (CATHETERS) ×3 IMPLANT
CLOTH BEACON ORANGE TIMEOUT ST (SAFETY) IMPLANT
COVER WAND RF STERILE (DRAPES) IMPLANT
GLOVE BIOGEL M STRL SZ7.5 (GLOVE) ×9 IMPLANT
GOWN STRL REUS W/TWL LRG LVL3 (GOWN DISPOSABLE) ×3 IMPLANT
GOWN STRL REUS W/TWL XL LVL3 (GOWN DISPOSABLE) ×3 IMPLANT
GUIDEWIRE ANG ZIPWIRE 038X150 (WIRE) IMPLANT
GUIDEWIRE STR DUAL SENSOR (WIRE) ×3 IMPLANT
KIT TURNOVER KIT A (KITS) ×3 IMPLANT
MANIFOLD NEPTUNE II (INSTRUMENTS) ×3 IMPLANT
PACK CYSTO (CUSTOM PROCEDURE TRAY) ×3 IMPLANT
STENT CONTOUR 6FRX28X.038 (STENTS) ×3 IMPLANT
TUBING CONNECTING 10 (TUBING) ×2 IMPLANT
TUBING CONNECTING 10' (TUBING) ×1

## 2018-08-10 NOTE — Op Note (Signed)
Preoperative diagnosis: Right mid ureteral stone, sepsis Postoperative diagnosis: Same  Procedure: Cystoscopy, right retrograde pyelogram, right ureteral stent placement, Foley catheter placement  Surgeon: Colinda Barth  Anesthesia: General  Indication for procedure: 53 year old female passing a 7 mm right ureteral stone who has felt progressively worse over the past couple of days.  Her blood pressure was low in the office and she fell.  She has been hypotensive today but responding to fluid boluses.  Lactic acid was up.  She was brought for an urgent ureteral stent.  Per her husband she has been sort of stumbling around the past the past couple days holding the wall as she moves around.  Also vomiting and taking poor p.o.  Findings: On cystoscopy the urethra and the bladder were unremarkable.  No stone or foreign body in the bladder.  Right retrograde pyelogram-this outlined a single ureter single collecting system unit with a filling defect in the mid right ureter with proximal hydroureteronephrosis and some tortuosity of the right proximal ureter consistent with obstruction.  After the wire was passed a good amount of debris laden urine quickly drained.  Urine was sent for culture.  Description of procedure: After consent was obtained patient brought to the operating room.  After adequate anesthesia she was placed in lithotomy position and prepped and draped in the usual sterile fashion.  A timeout was performed to confirm the patient and procedure.  The cystoscope was passed per urethra and the bladder inspected.  A 6 French open-ended catheter was used to cannulate the right ureteral orifice and retrograde injection of contrast was performed.  I then passed a sensor wire past the stone and coiled this in the collecting system.  Debris laden urine began to billow out.  A 6 x 26 cm stent was then advanced.  The wire was removed with the coil partially reconstituting and the lower pole calyx and a  good coil in the bladder.  The stent was draining well, dark urine.  A 16 French Foley catheter was placed in left to gravity drainage.  She was awakened and taken to the recovery room in stable condition.  Complications: None  Blood loss: Minimal  Specimens: Urine culture  Drains: 6 x 26 cm right ureteral stent  Disposition: Patient stable to PACU

## 2018-08-10 NOTE — ED Notes (Signed)
Signed Informed consent is placed at bedside. Patient was given CHG wipes to wipe down before procedure. Patient was unhooked from all monitor wires.

## 2018-08-10 NOTE — ED Notes (Signed)
CRITICAL VALUE STICKER  CRITICAL VALUE: Lactic Acid 2.7  RECEIVER (on-site recipient of call): N.Kymoni Monday, RN  DATE & TIME NOTIFIED: 08/10/2018  MESSENGER (representative from lab): Kyung Rudd   MD NOTIFIED:  Dr.Yao   TIME OF NOTIFICATION: [phone]

## 2018-08-10 NOTE — ED Notes (Signed)
U/A and culture per MD order. Pt assisted OOB to restroom per request..per pt unable to urinate, will try later. RN advised. [employer]

## 2018-08-10 NOTE — Anesthesia Procedure Notes (Signed)
Procedure Name: Intubation Date/Time: 08/10/2018 8:09 PM Performed by: Vanessa Sturgis, CRNA Pre-anesthesia Checklist: Patient identified, Emergency Drugs available, Suction available and Patient being monitored Patient Re-evaluated:Patient Re-evaluated prior to induction Oxygen Delivery Method: Circle system utilized Preoxygenation: Pre-oxygenation with 100% oxygen Induction Type: IV induction Ventilation: Mask ventilation without difficulty Laryngoscope Size: 2 and Miller Grade View: Grade I Tube type: Oral Tube size: 7.0 mm Number of attempts: 1 Airway Equipment and Method: Stylet Placement Confirmation: ETT inserted through vocal cords under direct vision,  positive ETCO2 and breath sounds checked- equal and bilateral Secured at: 22 cm Tube secured with: Tape Dental Injury: Teeth and Oropharynx as per pre-operative assessment

## 2018-08-10 NOTE — ED Notes (Signed)
Patient transported to CT and X-Ray 

## 2018-08-10 NOTE — Consult Note (Addendum)
Consultation: right ureteral stone, sepsis Requested by: Dr. Chaney Malling  History of Present Illness: 53 yo female seen by Dr. Alvester Morin at [facility] 08/07/2018. She had been having pain for about one month but it increased in severity. She was found on CT scan to have a 7 mm proximal right ureteral calculus with mild to moderate hydronephrosis. She also had evidence of bilateral medullary nephrocalcinosis. Stone is not really visible on KUB. Urine culture grew mixed growth an 10,000 yeast, not speciate.  She was brought to the office today for a catheter specimen and urinalysis when she became lightheaded and passed out and hit her head.  She was taken by EMS to the emergency where she was found to be hypotensive and elevated lactic acid.  Temperature was 99.7.  Also her creatinine increased to 2.6.  KUB was done which showed likely still present over the right sacrum.  She was brought this evening for an urgent cystoscopy with right retrograde pyelogram and right ureteral stent.  She has ureteroscopy scheduled for next Wednesday with Dr. Alvester Morin which may end up working out.  She needs a staged procedure anyway with a pre-stent to decompress the kidney tonight anyway. She hasnt made a lot of urine today - no void since around noon. Yesterday she had a lot of urgency. She hasn't seen a stone pass.   She has a prior history of stones and underwent ureteroscopy with Dr. Isabel Caprice.   Past Medical History:  Diagnosis Date  . Anxiety   . Asthma   . Depression   . Diabetes mellitus without complication (HCC)   . Frequency of urination   . GERD (gastroesophageal reflux disease)   . History of asthma    last episode yrs ago  . Hypertension   . OSA (obstructive sleep apnea)    pt had study done oct 2014--  schedule for cpap titrate after kidney stone surgery  . Schizophrenia (HCC)   . Ureteral calculi    BILATERAL  . Wears glasses    Past Surgical History:  Procedure Laterality Date  . CESAREAN SECTION   1991  &  2002   w/ bilateral tubal ligation in 2002  . CYSTOSCOPY WITH RETROGRADE PYELOGRAM, URETEROSCOPY AND STENT PLACEMENT Bilateral 05/02/2013   Procedure: CYSTOSCOPY WITH RETROGRADE PYELOGRAM, URETEROSCOPY ;  Surgeon: Valetta Fuller, MD;  Location: [facility];  Service: Urology;  Laterality: Bilateral;  . CYSTOSCOPY WITH STENT PLACEMENT Left 05/02/2013   Procedure: CYSTOSCOPY WITH STENT PLACEMENT;  Surgeon: Valetta Fuller, MD;  Location: [facility];  Service: Urology;  Laterality: Left;  . HOLMIUM LASER APPLICATION Bilateral 05/02/2013   Procedure: HOLMIUM LASER APPLICATION;  Surgeon: Valetta Fuller, MD;  Location: [facility];  Service: Urology;  Laterality: Bilateral;  . LAPAROSCOPIC APPENDECTOMY N/A 05/22/2014   Procedure: APPENDECTOMY LAPAROSCOPIC;  Surgeon: Glenna Fellows, MD;  Location: [facility];  Service: General;  Laterality: N/A;  . TONSILLECTOMY  age 47    Home Medications:  (Not in a hospital admission)  Allergies: No Known Allergies  History reviewed. No pertinent family history. Social History:  reports that she has never smoked. She has never used smokeless tobacco. She reports that she does not drink alcohol or use drugs.  ROS: A complete review of systems was performed.  All systems are negative except for pertinent findings as noted. Review of Systems  All other systems reviewed and are negative.    Physical Exam:  Vital signs in last 24  hours: Temp:  [99.7 F (37.6 C)] 99.7 F (37.6 C) (03/20 1543) Pulse Rate:  [92-96] 93 (03/20 1802) Resp:  [15-31] 25 (03/20 1802) BP: (87-120)/(60-98) 105/72 (03/20 1802) SpO2:  [95 %-97 %] 95 % (03/20 1802) Weight:  [141.5 kg] 141.5 kg (03/20 1539) General:  Alert and oriented, No acute distress HEENT: Normocephalic, atraumatic Cardiovascular: Regular rate and rhythm Lungs: Regular rate and effort Abdomen: Soft, nontender, nondistended, no abdominal masses Extremities:  No edema Neurologic: Grossly intact  Laboratory Data:  Results for orders placed or performed during the hospital encounter of 08/10/18 (from the past 24 hour(s))  CBC with Differential/Platelet     Status: Abnormal   Collection Time: 08/10/18  3:46 PM  Result Value Ref Range   WBC 11.1 (H) 4.0 - 10.5 K/uL   RBC 3.99 3.87 - 5.11 MIL/uL   Hemoglobin 8.7 (L) 12.0 - 15.0 g/dL   HCT 40.930.8 (L) 81.136.0 - 91.446.0 %   MCV 77.2 (L) 80.0 - 100.0 fL   MCH 21.8 (L) 26.0 - 34.0 pg   MCHC 28.2 (L) 30.0 - 36.0 g/dL   RDW 78.215.5 95.611.5 - 21.315.5 %   Platelets 293 150 - 400 K/uL   nRBC 0.0 0.0 - 0.2 %   Neutrophils Relative % 75 %   Neutro Abs 8.3 (H) 1.7 - 7.7 K/uL   Lymphocytes Relative 14 %   Lymphs Abs 1.6 0.7 - 4.0 K/uL   Monocytes Relative 9 %   Monocytes Absolute 1.0 0.1 - 1.0 K/uL   Eosinophils Relative 1 %   Eosinophils Absolute 0.1 0.0 - 0.5 K/uL   Basophils Relative 0 %   Basophils Absolute 0.0 0.0 - 0.1 K/uL   Immature Granulocytes 1 %   Abs Immature Granulocytes 0.08 (H) 0.00 - 0.07 K/uL  Comprehensive metabolic panel     Status: Abnormal   Collection Time: 08/10/18  3:46 PM  Result Value Ref Range   Sodium 132 (L) 135 - 145 mmol/L   Potassium 4.8 3.5 - 5.1 mmol/L   Chloride 102 98 - 111 mmol/L   CO2 21 (L) 22 - 32 mmol/L   Glucose, Bld 214 (H) 70 - 99 mg/dL   BUN 23 (H) 6 - 20 mg/dL   Creatinine, Ser 0.862.43 (H) 0.44 - 1.00 mg/dL   Calcium 57.811.2 (H) 8.9 - 10.3 mg/dL   Total Protein 7.7 6.5 - 8.1 g/dL   Albumin 3.2 (L) 3.5 - 5.0 g/dL   AST 23 15 - 41 U/L   ALT 20 0 - 44 U/L   Alkaline Phosphatase 77 38 - 126 U/L   Total Bilirubin 0.6 0.3 - 1.2 mg/dL   GFR calc non Af Amer 22 (L) >60 mL/min   GFR calc Af Amer 26 (L) >60 mL/min   Anion gap 9 5 - 15  Lactic acid, plasma     Status: Abnormal   Collection Time: 08/10/18  3:46 PM  Result Value Ref Range   Lactic Acid, Venous 2.7 (HH) 0.5 - 1.9 mmol/L  Lipase, blood     Status: None   Collection Time: 08/10/18  3:46 PM  Result Value Ref  Range   Lipase 26 11 - 51 U/L  I-stat troponin, ED     Status: None   Collection Time: 08/10/18  4:19 PM  Result Value Ref Range   Troponin i, poc 0.00 0.00 - 0.08 ng/mL   Comment 3          Lactic acid, plasma  Status: Abnormal   Collection Time: 08/10/18  6:30 PM  Result Value Ref Range   Lactic Acid, Venous 2.7 (HH) 0.5 - 1.9 mmol/L   Recent Results (from the past 240 hour(s))  Urine culture     Status: Abnormal   Collection Time: 08/04/18  5:46 PM  Result Value Ref Range Status   Specimen Description   Final    URINE, RANDOM Performed at [facility], [address] [place], [place] [address]    Special Requests   Final    NONE Performed at [facility], [address] [place], [place] [address]    Culture >=100,000 COLONIES/mL ESCHERICHIA COLI (A)  Final   Report Status 08/07/2018 FINAL  Final   Organism ID, Bacteria ESCHERICHIA COLI (A)  Final      Susceptibility   Escherichia coli - MIC*    AMPICILLIN >=32 RESISTANT Resistant     CEFAZOLIN 16 SENSITIVE Sensitive     CEFTRIAXONE <=1 SENSITIVE Sensitive     CIPROFLOXACIN <=0.25 SENSITIVE Sensitive     GENTAMICIN <=1 SENSITIVE Sensitive     IMIPENEM <=0.25 SENSITIVE Sensitive     NITROFURANTOIN <=16 SENSITIVE Sensitive     TRIMETH/SULFA <=20 SENSITIVE Sensitive     AMPICILLIN/SULBACTAM >=32 RESISTANT Resistant     PIP/TAZO <=4 SENSITIVE Sensitive     Extended ESBL NEGATIVE Sensitive     * >=100,000 COLONIES/mL ESCHERICHIA COLI   Creatinine: Recent Labs    08/04/18 1558 08/10/18 1546  CREATININE 1.82* 2.43*    Impression/Assessment:  Right ureteral stone sepsis - discussed pt with Dr Silverio Lay and reviewed labs and imaging. Rt ureteral stone has passed to mid-distal ureter on KUB. Pt with sign of sepsis so will set up for urgent right ureteral stent.    Plan:  I discussed with the patient the nature, potential benefits, risks and alternatives to cystoscopy, right retrograde and right  ureteral stent, including side effects of the proposed treatment, the likelihood of the patient achieving the goals of the procedure, and any potential problems that might occur during the procedure or recuperation. We discussed rationale for a staged procedure and possibility she passed the stone. All questions answered. Patient elects to proceed.   Jerilee Field 08/10/2018, 7:23 PM

## 2018-08-10 NOTE — Anesthesia Preprocedure Evaluation (Signed)
Anesthesia Evaluation  Patient identified by MRN, date of birth, ID band Patient awake    Reviewed: Allergy & Precautions, NPO status , Patient's Chart, lab work & pertinent test results  History of Anesthesia Complications Negative for: history of anesthetic complications  Airway Mallampati: II  TM Distance: >3 FB Neck ROM: Full    Dental  (+) Teeth Intact, Dental Advisory Given   Pulmonary asthma , sleep apnea and Continuous Positive Airway Pressure Ventilation ,    Pulmonary exam normal breath sounds clear to auscultation       Cardiovascular hypertension, Pt. on medications Normal cardiovascular exam Rhythm:Regular Rate:Normal     Neuro/Psych Anxiety Depression Schizophrenia negative neurological ROS     GI/Hepatic Neg liver ROS, GERD  Medicated and Controlled,Ureteral stone   Endo/Other  diabetes, Type obesity  Renal/GU negative Renal ROS     Musculoskeletal negative musculoskeletal ROS (+)   Abdominal   Peds  Hematology negative hematology ROS (+)   Anesthesia Other Findings Day of surgery medications reviewed with the patient.  Reproductive/Obstetrics                             Anesthesia Physical Anesthesia Plan  ASA: III and emergent  Anesthesia Plan: General   Post-op Pain Management:    Induction: Intravenous, Rapid sequence and Cricoid pressure planned  PONV Risk Score and Plan: 3 and Treatment may vary due to age or medical condition, Ondansetron, Dexamethasone and Midazolam  Airway Management Planned: Oral ETT and Video Laryngoscope Planned  Additional Equipment:   Intra-op Plan:   Post-operative Plan: Extubation in OR  Informed Consent: I have reviewed the patients History and Physical, chart, labs and discussed the procedure including the risks, benefits and alternatives for the proposed anesthesia with the patient or authorized representative who  has indicated his/her understanding and acceptance.     Dental advisory given  Plan Discussed with: CRNA  Anesthesia Plan Comments:         Anesthesia Quick Evaluation

## 2018-08-10 NOTE — Transfer of Care (Signed)
Immediate Anesthesia Transfer of Care Note  Patient: Monique Holland  Procedure(s) Performed: CYSTOSCOPY WITH RETROGRADE PYELOGRAM/URETERAL STENT PLACEMENT (Right )  Patient Location: PACU  Anesthesia Type:General  Level of Consciousness: awake, alert , oriented and patient cooperative  Airway & Oxygen Therapy: Patient Spontanous Breathing and Patient connected to face mask  Post-op Assessment: Report given to RN and Post -op Vital signs reviewed and stable  Post vital signs: Reviewed and stable  Last Vitals:  Vitals Value Taken Time  BP 103/56 08/10/2018  9:05 PM  Temp    Pulse 90 08/10/2018  9:07 PM  Resp 15 08/10/2018  9:07 PM  SpO2 100 % 08/10/2018  9:07 PM  Vitals shown include unvalidated device data.  Last Pain:  Vitals:   08/10/18 1539  PainSc: 8          Complications: No apparent anesthesia complications

## 2018-08-10 NOTE — H&P (View-Only) (Signed)
Consultation: right ureteral stone, sepsis Requested by: Dr. Chaney Malling  History of Present Illness: 53 yo female seen by Dr. Alvester Morin at [facility] 08/07/2018. She had been having pain for about one month but it increased in severity. She was found on CT scan to have a 7 mm proximal right ureteral calculus with mild to moderate hydronephrosis. She also had evidence of bilateral medullary nephrocalcinosis. Stone is not really visible on KUB. Urine culture grew mixed growth an 10,000 yeast, not speciate.  She was brought to the office today for a catheter specimen and urinalysis when she became lightheaded and passed out and hit her head.  She was taken by EMS to the emergency where she was found to be hypotensive and elevated lactic acid.  Temperature was 99.7.  Also her creatinine increased to 2.6.  KUB was done which showed likely still present over the right sacrum.  She was brought this evening for an urgent cystoscopy with right retrograde pyelogram and right ureteral stent.  She has ureteroscopy scheduled for next Wednesday with Dr. Alvester Morin which may end up working out.  She needs a staged procedure anyway with a pre-stent to decompress the kidney tonight anyway. She hasnt made a lot of urine today - no void since around noon. Yesterday she had a lot of urgency. She hasn't seen a stone pass.   She has a prior history of stones and underwent ureteroscopy with Dr. Isabel Caprice.   Past Medical History:  Diagnosis Date  . Anxiety   . Asthma   . Depression   . Diabetes mellitus without complication (HCC)   . Frequency of urination   . GERD (gastroesophageal reflux disease)   . History of asthma    last episode yrs ago  . Hypertension   . OSA (obstructive sleep apnea)    pt had study done oct 2014--  schedule for cpap titrate after kidney stone surgery  . Schizophrenia (HCC)   . Ureteral calculi    BILATERAL  . Wears glasses    Past Surgical History:  Procedure Laterality Date  . CESAREAN SECTION   1991  &  2002   w/ bilateral tubal ligation in 2002  . CYSTOSCOPY WITH RETROGRADE PYELOGRAM, URETEROSCOPY AND STENT PLACEMENT Bilateral 05/02/2013   Procedure: CYSTOSCOPY WITH RETROGRADE PYELOGRAM, URETEROSCOPY ;  Surgeon: Valetta Fuller, MD;  Location: [facility];  Service: Urology;  Laterality: Bilateral;  . CYSTOSCOPY WITH STENT PLACEMENT Left 05/02/2013   Procedure: CYSTOSCOPY WITH STENT PLACEMENT;  Surgeon: Valetta Fuller, MD;  Location: [facility];  Service: Urology;  Laterality: Left;  . HOLMIUM LASER APPLICATION Bilateral 05/02/2013   Procedure: HOLMIUM LASER APPLICATION;  Surgeon: Valetta Fuller, MD;  Location: [facility];  Service: Urology;  Laterality: Bilateral;  . LAPAROSCOPIC APPENDECTOMY N/A 05/22/2014   Procedure: APPENDECTOMY LAPAROSCOPIC;  Surgeon: Glenna Fellows, MD;  Location: [facility];  Service: General;  Laterality: N/A;  . TONSILLECTOMY  age 47    Home Medications:  (Not in a hospital admission)  Allergies: No Known Allergies  History reviewed. No pertinent family history. Social History:  reports that she has never smoked. She has never used smokeless tobacco. She reports that she does not drink alcohol or use drugs.  ROS: A complete review of systems was performed.  All systems are negative except for pertinent findings as noted. Review of Systems  All other systems reviewed and are negative.    Physical Exam:  Vital signs in last 24  hours: Temp:  [99.7 F (37.6 C)] 99.7 F (37.6 C) (03/20 1543) Pulse Rate:  [92-96] 93 (03/20 1802) Resp:  [15-31] 25 (03/20 1802) BP: (87-120)/(60-98) 105/72 (03/20 1802) SpO2:  [95 %-97 %] 95 % (03/20 1802) Weight:  [141.5 kg] 141.5 kg (03/20 1539) General:  Alert and oriented, No acute distress HEENT: Normocephalic, atraumatic Cardiovascular: Regular rate and rhythm Lungs: Regular rate and effort Abdomen: Soft, nontender, nondistended, no abdominal masses Extremities:  No edema Neurologic: Grossly intact  Laboratory Data:  Results for orders placed or performed during the hospital encounter of 08/10/18 (from the past 24 hour(s))  CBC with Differential/Platelet     Status: Abnormal   Collection Time: 08/10/18  3:46 PM  Result Value Ref Range   WBC 11.1 (H) 4.0 - 10.5 K/uL   RBC 3.99 3.87 - 5.11 MIL/uL   Hemoglobin 8.7 (L) 12.0 - 15.0 g/dL   HCT 30.8 (L) 36.0 - 46.0 %   MCV 77.2 (L) 80.0 - 100.0 fL   MCH 21.8 (L) 26.0 - 34.0 pg   MCHC 28.2 (L) 30.0 - 36.0 g/dL   RDW 15.5 11.5 - 15.5 %   Platelets 293 150 - 400 K/uL   nRBC 0.0 0.0 - 0.2 %   Neutrophils Relative % 75 %   Neutro Abs 8.3 (H) 1.7 - 7.7 K/uL   Lymphocytes Relative 14 %   Lymphs Abs 1.6 0.7 - 4.0 K/uL   Monocytes Relative 9 %   Monocytes Absolute 1.0 0.1 - 1.0 K/uL   Eosinophils Relative 1 %   Eosinophils Absolute 0.1 0.0 - 0.5 K/uL   Basophils Relative 0 %   Basophils Absolute 0.0 0.0 - 0.1 K/uL   Immature Granulocytes 1 %   Abs Immature Granulocytes 0.08 (H) 0.00 - 0.07 K/uL  Comprehensive metabolic panel     Status: Abnormal   Collection Time: 08/10/18  3:46 PM  Result Value Ref Range   Sodium 132 (L) 135 - 145 mmol/L   Potassium 4.8 3.5 - 5.1 mmol/L   Chloride 102 98 - 111 mmol/L   CO2 21 (L) 22 - 32 mmol/L   Glucose, Bld 214 (H) 70 - 99 mg/dL   BUN 23 (H) 6 - 20 mg/dL   Creatinine, Ser 2.43 (H) 0.44 - 1.00 mg/dL   Calcium 11.2 (H) 8.9 - 10.3 mg/dL   Total Protein 7.7 6.5 - 8.1 g/dL   Albumin 3.2 (L) 3.5 - 5.0 g/dL   AST 23 15 - 41 U/L   ALT 20 0 - 44 U/L   Alkaline Phosphatase 77 38 - 126 U/L   Total Bilirubin 0.6 0.3 - 1.2 mg/dL   GFR calc non Af Amer 22 (L) >60 mL/min   GFR calc Af Amer 26 (L) >60 mL/min   Anion gap 9 5 - 15  Lactic acid, plasma     Status: Abnormal   Collection Time: 08/10/18  3:46 PM  Result Value Ref Range   Lactic Acid, Venous 2.7 (HH) 0.5 - 1.9 mmol/L  Lipase, blood     Status: None   Collection Time: 08/10/18  3:46 PM  Result Value Ref  Range   Lipase 26 11 - 51 U/L  I-stat troponin, ED     Status: None   Collection Time: 08/10/18  4:19 PM  Result Value Ref Range   Troponin i, poc 0.00 0.00 - 0.08 ng/mL   Comment 3          Lactic acid, plasma       Status: Abnormal   Collection Time: 08/10/18  6:30 PM  Result Value Ref Range   Lactic Acid, Venous 2.7 (HH) 0.5 - 1.9 mmol/L   Recent Results (from the past 240 hour(s))  Urine culture     Status: Abnormal   Collection Time: 08/04/18  5:46 PM  Result Value Ref Range Status   Specimen Description   Final    URINE, RANDOM Performed at [facility], [address] [place], [place] [address]    Special Requests   Final    NONE Performed at [facility], [address] [place], [place] [address]    Culture >=100,000 COLONIES/mL ESCHERICHIA COLI (A)  Final   Report Status 08/07/2018 FINAL  Final   Organism ID, Bacteria ESCHERICHIA COLI (A)  Final      Susceptibility   Escherichia coli - MIC*    AMPICILLIN >=32 RESISTANT Resistant     CEFAZOLIN 16 SENSITIVE Sensitive     CEFTRIAXONE <=1 SENSITIVE Sensitive     CIPROFLOXACIN <=0.25 SENSITIVE Sensitive     GENTAMICIN <=1 SENSITIVE Sensitive     IMIPENEM <=0.25 SENSITIVE Sensitive     NITROFURANTOIN <=16 SENSITIVE Sensitive     TRIMETH/SULFA <=20 SENSITIVE Sensitive     AMPICILLIN/SULBACTAM >=32 RESISTANT Resistant     PIP/TAZO <=4 SENSITIVE Sensitive     Extended ESBL NEGATIVE Sensitive     * >=100,000 COLONIES/mL ESCHERICHIA COLI   Creatinine: Recent Labs    08/04/18 1558 08/10/18 1546  CREATININE 1.82* 2.43*    Impression/Assessment:  Right ureteral stone sepsis - discussed pt with Dr Silverio Lay and reviewed labs and imaging. Rt ureteral stone has passed to mid-distal ureter on KUB. Pt with sign of sepsis so will set up for urgent right ureteral stent.    Plan:  I discussed with the patient the nature, potential benefits, risks and alternatives to cystoscopy, right retrograde and right  ureteral stent, including side effects of the proposed treatment, the likelihood of the patient achieving the goals of the procedure, and any potential problems that might occur during the procedure or recuperation. We discussed rationale for a staged procedure and possibility she passed the stone. All questions answered. Patient elects to proceed.   Jerilee Field 08/10/2018, 7:23 PM

## 2018-08-10 NOTE — ED Notes (Signed)
Patient was unable to provide urine sample from initial order. This RN clicked off initial order by accident. This RN put new order in for specimen collection.

## 2018-08-10 NOTE — ED Provider Notes (Signed)
[facility]-EMERGENCY DEPT Provider Note   CSN: [id number] Arrival date & time: 08/10/18  1522    History   Chief Complaint Chief Complaint  Patient presents with  . Hypotension  . Flank Pain    HPI Monique Holland is a 53 y.o. female history of diabetes, reflux, hypertension, kidney stone here presenting with hypotension, syncope, flank pain.  Patient was seen in the ED about 6 days ago and was diagnosed with 7 mm stone with obstructive hydro-with possible UTI.  Urology was consulted and since patient appeared well at that time, it was recommended to discharge her home with antibiotics as well as urology follow-up.  Patient states that she never filled her antibiotics and has persistent vomiting.  She states that anything she eats she throws the back up and she had persistent right flank pain.  Today she went to the urology office and was sitting in the examination table and felt lightheaded and dizzy and fell face forward and hit her head as well as the left knee.  She was noted to be hypotensive afterwards with a blood pressure in the 80s to 90s.  Patient states that she still feels very dizzy.  She denies any chest pain or shortness of breath or fevers at home.  Per the urology note, patient needs to get a urgent ureteral stent today.     The history is provided by the patient.    Past Medical History:  Diagnosis Date  . Anxiety   . Asthma   . Depression   . Diabetes mellitus without complication (HCC)   . Frequency of urination   . GERD (gastroesophageal reflux disease)   . History of asthma    last episode yrs ago  . Hypertension   . OSA (obstructive sleep apnea)    pt had study done oct 2014--  schedule for cpap titrate after kidney stone surgery  . Schizophrenia (HCC)   . Ureteral calculi    BILATERAL  . Wears glasses     Patient Active Problem List   Diagnosis Date Noted  . Acute appendicitis 05/22/2014  . Ureteral calculus 05/02/2013    Past  Surgical History:  Procedure Laterality Date  . CESAREAN SECTION  1991  &  2002   w/ bilateral tubal ligation in 2002  . CYSTOSCOPY WITH RETROGRADE PYELOGRAM, URETEROSCOPY AND STENT PLACEMENT Bilateral 05/02/2013   Procedure: CYSTOSCOPY WITH RETROGRADE PYELOGRAM, URETEROSCOPY ;  Surgeon: Valetta Fuller, MD;  Location: [facility];  Service: Urology;  Laterality: Bilateral;  . CYSTOSCOPY WITH STENT PLACEMENT Left 05/02/2013   Procedure: CYSTOSCOPY WITH STENT PLACEMENT;  Surgeon: Valetta Fuller, MD;  Location: [facility];  Service: Urology;  Laterality: Left;  . HOLMIUM LASER APPLICATION Bilateral 05/02/2013   Procedure: HOLMIUM LASER APPLICATION;  Surgeon: Valetta Fuller, MD;  Location: [facility];  Service: Urology;  Laterality: Bilateral;  . LAPAROSCOPIC APPENDECTOMY N/A 05/22/2014   Procedure: APPENDECTOMY LAPAROSCOPIC;  Surgeon: Glenna Fellows, MD;  Location: [facility];  Service: General;  Laterality: N/A;  . TONSILLECTOMY  age 4     OB History   No obstetric history on file.      Home Medications    Prior to Admission medications   Medication Sig Start Date End Date Taking? Authorizing Provider  albuterol (VENTOLIN HFA) 108 (90 Base) MCG/ACT inhaler Inhale 1-2 puffs into the lungs every 6 (six) hours as needed for wheezing or shortness of breath.   Yes [provider]  amitriptyline (ELAVIL) 50 MG tablet Take 50 mg by mouth at bedtime.   Yes [provider]  amLODipine (NORVASC) 10 MG tablet Take 10 mg by mouth every morning.    Yes [provider]  clonazePAM (KLONOPIN) 1 MG tablet Take 1 mg by mouth 2 (two) times daily as needed for anxiety.   Yes [provider]  diphenhydrAMINE HCl, Sleep, (ZZZQUIL) 25 MG CAPS Take 25 mg by mouth at bedtime as needed (sleep).   Yes [provider]  lisinopril-hydrochlorothiazide (PRINZIDE,ZESTORETIC) 20-25 MG per tablet Take 1 tablet by mouth daily.    Yes [provider]  oxyCODONE (OXYCONTIN) 10 mg 12 hr tablet Take 10 mg by mouth 4 (four) times daily as needed (pain).    Yes [provider]  sertraline (ZOLOFT) 100 MG tablet Take 100 mg by mouth daily.   Yes [provider]  benzonatate (TESSALON) 100 MG capsule Take 1 capsule (100 mg total) by mouth 3 (three) times daily as needed for cough. Patient not taking: Reported on 08/10/2018 05/18/18   Michela Pitcher A, PA-C  cephALEXin (KEFLEX) 500 MG capsule Take 1 capsule (500 mg total) by mouth 2 (two) times daily for 7 days. 08/04/18 08/11/18  Robinson, [place] N, PA-C  fluticasone (FLONASE) 50 MCG/ACT nasal spray Place 2 sprays into both nostrils daily. Patient not taking: Reported on 08/10/2018 05/18/18   Michela Pitcher A, PA-C  HYDROcodone-acetaminophen (NORCO/VICODIN) 5-325 MG per tablet Take 1 tablet by mouth every 6 (six) hours as needed. Patient not taking: Reported on 03/01/2016 02/12/15   Angelina Ok, MD  ondansetron (ZOFRAN ODT) 4 MG disintegrating tablet Take 1 tablet (4 mg total) by mouth every 8 (eight) hours as needed for nausea or vomiting. 08/04/18   Robinson, [place] N, PA-C    Family History History reviewed. No pertinent family history.  Social History Social History   Tobacco Use  . Smoking status: Never Smoker  . Smokeless tobacco: Never Used  Substance Use Topics  . Alcohol use: No  . Drug use: No     Allergies   Patient has no known allergies.   Review of Systems Review of Systems  Genitourinary: Positive for flank pain.  All other systems reviewed and are negative.    Physical Exam Updated Vital Signs BP 105/72 (BP Location: Right Arm)   Pulse 93   Temp 99.7 F (37.6 C)   Resp (!) 25   Ht 5\' 8"  (1.727 m)   Wt (!) 141.5 kg   LMP 03/23/2014 Comment: patient states her periods are irregular  SpO2 95%   BMI 47.44 kg/m   Physical Exam Vitals signs and nursing note reviewed.  Constitutional:      Comments: Chronically ill,  obese, dehydrated   HENT:     Head: Normocephalic.     Comments: Bruising L face, no obvious deformity     Nose: Nose normal.     Mouth/Throat:     Mouth: Mucous membranes are dry.     Comments: MM dry  Eyes:     Extraocular Movements: Extraocular movements intact.     Pupils: Pupils are equal, round, and reactive to light.  Neck:     Musculoskeletal: Normal range of motion.  Cardiovascular:     Rate and Rhythm: Normal rate and regular rhythm.     Pulses: Normal pulses.     Heart sounds: Normal heart sounds.  Pulmonary:     Effort: Pulmonary effort is normal.  Breath sounds: Normal breath sounds.  Abdominal:     General: Abdomen is flat.     Palpations: Abdomen is soft.     Comments: Mild R CVAT   Musculoskeletal: Normal range of motion.     Comments: Mild L knee tenderness able to range the knee, no obvious effusion.   Skin:    General: Skin is warm.     Capillary Refill: Capillary refill takes less than 2 seconds.  Neurological:     General: No focal deficit present.     Mental Status: She is alert.  Psychiatric:        Mood and Affect: Mood normal.        Behavior: Behavior normal.      ED Treatments / Results  Labs (all labs ordered are listed, but only abnormal results are displayed) Labs Reviewed  CBC WITH DIFFERENTIAL/PLATELET - Abnormal; Notable for the following components:      Result Value   WBC 11.1 (*)    Hemoglobin 8.7 (*)    HCT 30.8 (*)    MCV 77.2 (*)    MCH 21.8 (*)    MCHC 28.2 (*)    Neutro Abs 8.3 (*)    Abs Immature Granulocytes 0.08 (*)    All other components within normal limits  COMPREHENSIVE METABOLIC PANEL - Abnormal; Notable for the following components:   Sodium 132 (*)    CO2 21 (*)    Glucose, Bld 214 (*)    BUN 23 (*)    Creatinine, Ser 2.43 (*)    Calcium 11.2 (*)    Albumin 3.2 (*)    GFR calc non Af Amer 22 (*)    GFR calc Af Amer 26 (*)    All other components within normal limits  LACTIC ACID, PLASMA - Abnormal;  Notable for the following components:   Lactic Acid, Venous 2.7 (*)    All other components within normal limits  CULTURE, BLOOD (ROUTINE X 2)  CULTURE, BLOOD (ROUTINE X 2)  URINE CULTURE  LIPASE, BLOOD  LACTIC ACID, PLASMA  URINALYSIS, ROUTINE W REFLEX MICROSCOPIC  I-STAT TROPONIN, ED    EKG EKG Interpretation  Date/Time:  Friday August 10 2018 15:46:31 EDT Ventricular Rate:  97 PR Interval:    QRS Duration: 99 QT Interval:  353 QTC Calculation: 449 R Axis:   -7 Text Interpretation:  Sinus rhythm Consider left atrial enlargement No significant change since last tracing Confirmed by Richardean Canal [id number]) on 08/10/2018 4:46:48 PM   Radiology Dg Chest 1 View  Result Date: 08/10/2018 CLINICAL DATA:  Fall at doctor's office with head injury. Knee pain. EXAM: CHEST  1 VIEW COMPARISON:  Radiographs 05/18/2018 and 07/23/2017. FINDINGS: 1631 hours. Lesser degree of inspiration with patchy right basilar pulmonary opacity, probably atelectasis. There is no edema, confluent airspace opacity, pleural effusion or pneumothorax. No acute osseous findings are evident. Telemetry leads overlie the chest. IMPRESSION: Patchy right basilar opacity likely represents atelectasis given the lesser degree of inspiration. No acute findings. Electronically Signed   By: Carey Bullocks M.D.   On: 08/10/2018 17:13   Dg Abdomen 1 View  Result Date: 08/10/2018 CLINICAL DATA:  Fall at doctor's office with head injury. Knee pain. Known kidney stone. EXAM: ABDOMEN - 1 VIEW COMPARISON:  CT abdomen 08/04/2018. FINDINGS: The bowel gas pattern is normal. Bilateral nephrolithiasis again noted. The previously demonstrated proximal right ureteral calculus is likely positioned over the lower sacrum. No acute osseous findings. Mild lumbar spondylosis. IMPRESSION: Apparent  interval distal passage of the proximal right ureteral calculus, now overlapping the sacrum, consistent with location in the distal ureter. Bilateral  nephrolithiasis. Electronically Signed   By: Carey BullocksWilliam  Veazey M.D.   On: 08/10/2018 17:18   Ct Head Wo Contrast  Result Date: 08/10/2018 CLINICAL DATA:  Larey SeatFell and hit head with left-sided swelling EXAM: CT HEAD WITHOUT CONTRAST CT MAXILLOFACIAL WITHOUT CONTRAST CT CERVICAL SPINE WITHOUT CONTRAST TECHNIQUE: Multidetector CT imaging of the head, cervical spine, and maxillofacial structures were performed using the standard protocol without intravenous contrast. Multiplanar CT image reconstructions of the cervical spine and maxillofacial structures were also generated. COMPARISON:  None. FINDINGS: CT HEAD FINDINGS Brain: No acute territorial infarction, hemorrhage, or intracranial mass. Mild atrophy. Normal ventricle size. Vascular: No hyperdense vessel.  No unexpected calcification Skull: No depressed skull fracture Other: None CT MAXILLOFACIAL FINDINGS Osseous: No acute nasal bone fracture. Bilateral mandibular heads are normally position. No mandibular fracture. Mastoid air cells are clear. Pterygoid plates and zygomatic arches are intact. Prominent root lucency right maxillary central incisor. Orbits: Negative. No traumatic or inflammatory finding. Sinuses: Clear. Soft tissues: Negative. CT CERVICAL SPINE FINDINGS Alignment: Straightening of the cervical spine. No subluxation. Facet alignment maintained Skull base and vertebrae: No acute fracture. No primary bone lesion or focal pathologic process. Soft tissues and spinal canal: No prevertebral fluid or swelling. No visible canal hematoma. Disc levels:  Mild diffuse degenerative changes C4 through C7. Upper chest: Negative. Other: None IMPRESSION: 1. No CT evidence for acute intracranial abnormality.  Mild atrophy 2. No acute facial bone fracture 3. Straightening of the cervical spine without acute osseous abnormality. Electronically Signed   By: Jasmine PangKim  Fujinaga M.D.   On: 08/10/2018 16:48   Ct Cervical Spine Wo Contrast  Result Date: 08/10/2018 CLINICAL DATA:   Larey SeatFell and hit head with left-sided swelling EXAM: CT HEAD WITHOUT CONTRAST CT MAXILLOFACIAL WITHOUT CONTRAST CT CERVICAL SPINE WITHOUT CONTRAST TECHNIQUE: Multidetector CT imaging of the head, cervical spine, and maxillofacial structures were performed using the standard protocol without intravenous contrast. Multiplanar CT image reconstructions of the cervical spine and maxillofacial structures were also generated. COMPARISON:  None. FINDINGS: CT HEAD FINDINGS Brain: No acute territorial infarction, hemorrhage, or intracranial mass. Mild atrophy. Normal ventricle size. Vascular: No hyperdense vessel.  No unexpected calcification Skull: No depressed skull fracture Other: None CT MAXILLOFACIAL FINDINGS Osseous: No acute nasal bone fracture. Bilateral mandibular heads are normally position. No mandibular fracture. Mastoid air cells are clear. Pterygoid plates and zygomatic arches are intact. Prominent root lucency right maxillary central incisor. Orbits: Negative. No traumatic or inflammatory finding. Sinuses: Clear. Soft tissues: Negative. CT CERVICAL SPINE FINDINGS Alignment: Straightening of the cervical spine. No subluxation. Facet alignment maintained Skull base and vertebrae: No acute fracture. No primary bone lesion or focal pathologic process. Soft tissues and spinal canal: No prevertebral fluid or swelling. No visible canal hematoma. Disc levels:  Mild diffuse degenerative changes C4 through C7. Upper chest: Negative. Other: None IMPRESSION: 1. No CT evidence for acute intracranial abnormality.  Mild atrophy 2. No acute facial bone fracture 3. Straightening of the cervical spine without acute osseous abnormality. Electronically Signed   By: Jasmine PangKim  Fujinaga M.D.   On: 08/10/2018 16:48   Dg Knee Complete 4 Views Left  Result Date: 08/10/2018 CLINICAL DATA:  Fall at doctor's office with head injury. Knee pain. EXAM: LEFT KNEE - COMPLETE 4+ VIEW COMPARISON:  Radiographs 09/05/2017 and 10/08/2015. FINDINGS: The  mineralization and alignment are normal. There is no evidence of acute  fracture or dislocation. Age advanced tricompartmental osteoarthritis is again noted with severe joint space narrowing and osteophyte formation in the medial and patellofemoral compartments. No significant joint effusion. IMPRESSION: No acute osseous findings. Advanced tricompartmental osteoarthritis. Electronically Signed   By: Carey Bullocks M.D.   On: 08/10/2018 17:16   Ct Maxillofacial Wo Contrast  Result Date: 08/10/2018 CLINICAL DATA:  Larey Seat and hit head with left-sided swelling EXAM: CT HEAD WITHOUT CONTRAST CT MAXILLOFACIAL WITHOUT CONTRAST CT CERVICAL SPINE WITHOUT CONTRAST TECHNIQUE: Multidetector CT imaging of the head, cervical spine, and maxillofacial structures were performed using the standard protocol without intravenous contrast. Multiplanar CT image reconstructions of the cervical spine and maxillofacial structures were also generated. COMPARISON:  None. FINDINGS: CT HEAD FINDINGS Brain: No acute territorial infarction, hemorrhage, or intracranial mass. Mild atrophy. Normal ventricle size. Vascular: No hyperdense vessel.  No unexpected calcification Skull: No depressed skull fracture Other: None CT MAXILLOFACIAL FINDINGS Osseous: No acute nasal bone fracture. Bilateral mandibular heads are normally position. No mandibular fracture. Mastoid air cells are clear. Pterygoid plates and zygomatic arches are intact. Prominent root lucency right maxillary central incisor. Orbits: Negative. No traumatic or inflammatory finding. Sinuses: Clear. Soft tissues: Negative. CT CERVICAL SPINE FINDINGS Alignment: Straightening of the cervical spine. No subluxation. Facet alignment maintained Skull base and vertebrae: No acute fracture. No primary bone lesion or focal pathologic process. Soft tissues and spinal canal: No prevertebral fluid or swelling. No visible canal hematoma. Disc levels:  Mild diffuse degenerative changes C4 through C7.  Upper chest: Negative. Other: None IMPRESSION: 1. No CT evidence for acute intracranial abnormality.  Mild atrophy 2. No acute facial bone fracture 3. Straightening of the cervical spine without acute osseous abnormality. Electronically Signed   By: Jasmine Pang M.D.   On: 08/10/2018 16:48    Procedures Procedures (including critical care time)  CRITICAL CARE Performed by: Richardean Canal   Total critical care time: 30 minutes  Critical care time was exclusive of separately billable procedures and treating other patients.  Critical care was necessary to treat or prevent imminent or life-threatening deterioration.  Critical care was time spent personally by me on the following activities: development of treatment plan with patient and/or surrogate as well as nursing, discussions with consultants, evaluation of patient's response to treatment, examination of patient, obtaining history from patient or surrogate, ordering and performing treatments and interventions, ordering and review of laboratory studies, ordering and review of radiographic studies, pulse oximetry and re-evaluation of patient's condition.   Medications Ordered in ED Medications  sodium chloride 0.9 % bolus 1,000 mL (1,000 mLs Intravenous New Bag/Given 08/10/18 1554)  morphine 4 MG/ML injection 4 mg (4 mg Intravenous Given 08/10/18 1556)  cefTRIAXone (ROCEPHIN) 1 g in sodium chloride 0.9 % 100 mL IVPB (0 g Intravenous Stopped 08/10/18 1747)  ondansetron (ZOFRAN) injection 4 mg (4 mg Intravenous Given 08/10/18 1556)     Initial Impression / Assessment and Plan / ED Course  I have reviewed the triage vital signs and the nursing notes.  Pertinent labs & imaging results that were available during my care of the patient were reviewed by me and considered in my medical decision making (see chart for details).       Monique Holland is a 53 y.o. female here with hypotension, syncope. She had possible infected kidney stone and never  filled her antibiotic. She is hypotensive 87/60 in the ED. I am concerned that she is septic from her kidney stone. Will order cbc, cmp, lactate, cultures.  Will get CT head/neck/face, xrays given that she had a fall. Will give IVF and IV rocephin (recent urine culture is sensitive to rocephin). Will consult urology as well.      6:17 PM WBC 11. Hg 8.7. Cr 2.4. Lactate 2.7. Xray showed R ureteral stone. Given that she is hypotensive, has AKI, I am concerned that she is septic from infected stone. BP improved with IVF. I talked to Dr. Mena Goes from urology. He will perform urgent ureteral stent and agreed with IV abx and request hospitalist admission.   Final Clinical Impressions(s) / ED Diagnoses   Final diagnoses:  None    ED Discharge Orders    None       Charlynne Pander, MD 08/10/18 [id number]

## 2018-08-10 NOTE — ED Triage Notes (Signed)
Patient arrived by EMS from doctors office. Pt fell at doctors office and hit head. Pt has swelling to LFT side of head per EMS. Pt c/o LFT knee pain. Pt was hypotensive after fall.   CBG 202   BP 107/44.  EMS gave 250 mL normal saline.

## 2018-08-11 DIAGNOSIS — N201 Calculus of ureter: Secondary | ICD-10-CM

## 2018-08-11 DIAGNOSIS — R6520 Severe sepsis without septic shock: Secondary | ICD-10-CM

## 2018-08-11 DIAGNOSIS — N178 Other acute kidney failure: Secondary | ICD-10-CM | POA: Diagnosis not present

## 2018-08-11 DIAGNOSIS — N179 Acute kidney failure, unspecified: Secondary | ICD-10-CM

## 2018-08-11 DIAGNOSIS — I959 Hypotension, unspecified: Secondary | ICD-10-CM

## 2018-08-11 DIAGNOSIS — E119 Type 2 diabetes mellitus without complications: Secondary | ICD-10-CM

## 2018-08-11 DIAGNOSIS — A419 Sepsis, unspecified organism: Secondary | ICD-10-CM

## 2018-08-11 LAB — CBC
HCT: 26.9 % — ABNORMAL LOW (ref 36.0–46.0)
Hemoglobin: 7.7 g/dL — ABNORMAL LOW (ref 12.0–15.0)
MCH: 22 pg — ABNORMAL LOW (ref 26.0–34.0)
MCHC: 28.6 g/dL — ABNORMAL LOW (ref 30.0–36.0)
MCV: 76.9 fL — ABNORMAL LOW (ref 80.0–100.0)
Platelets: 239 10*3/uL (ref 150–400)
RBC: 3.5 MIL/uL — ABNORMAL LOW (ref 3.87–5.11)
RDW: 15.9 % — ABNORMAL HIGH (ref 11.5–15.5)
WBC: 9.4 10*3/uL (ref 4.0–10.5)
nRBC: 0 % (ref 0.0–0.2)

## 2018-08-11 LAB — GLUCOSE, CAPILLARY
Glucose-Capillary: 259 mg/dL — ABNORMAL HIGH (ref 70–99)
Glucose-Capillary: 242 mg/dL — ABNORMAL HIGH (ref 70–99)
Glucose-Capillary: 119 mg/dL — ABNORMAL HIGH (ref 70–99)
Glucose-Capillary: 183 mg/dL — ABNORMAL HIGH (ref 70–99)
Glucose-Capillary: 109 mg/dL — ABNORMAL HIGH (ref 70–99)

## 2018-08-11 LAB — BASIC METABOLIC PANEL
Anion gap: 8 (ref 5–15)
BUN: 26 mg/dL — ABNORMAL HIGH (ref 6–20)
CO2: 20 mmol/L — ABNORMAL LOW (ref 22–32)
Calcium: 10.5 mg/dL — ABNORMAL HIGH (ref 8.9–10.3)
Chloride: 107 mmol/L (ref 98–111)
Creatinine, Ser: 2.03 mg/dL — ABNORMAL HIGH (ref 0.44–1.00)
GFR calc non Af Amer: 28 mL/min — ABNORMAL LOW (ref >60)
GFR, EST AFRICAN AMERICAN: 32 mL/min — AB (ref >60)
Glucose, Bld: 262 mg/dL — ABNORMAL HIGH (ref 70–99)
Potassium: 5.2 mmol/L — ABNORMAL HIGH (ref 3.5–5.1)
Sodium: 135 mmol/L (ref 135–145)

## 2018-08-11 LAB — HEMOGLOBIN A1C
Hgb A1c MFr Bld: 7.9 % — ABNORMAL HIGH (ref 4.8–5.6)
Mean Plasma Glucose: 180.03 mg/dL

## 2018-08-11 LAB — URINALYSIS, ROUTINE W REFLEX MICROSCOPIC
Bilirubin Urine: NEGATIVE
Glucose, UA: NEGATIVE mg/dL
Ketones, ur: NEGATIVE mg/dL
Nitrite: NEGATIVE
Protein, ur: NEGATIVE mg/dL
RBC / HPF: >50 RBC/hpf — ABNORMAL HIGH (ref 0–5)
Specific Gravity, Urine: 1.014 (ref 1.005–1.030)
pH: 5 (ref 5.0–8.0)

## 2018-08-11 LAB — HIV ANTIBODY (ROUTINE TESTING W REFLEX): HIV SCREEN 4TH GENERATION: NONREACTIVE

## 2018-08-11 LAB — IRON AND TIBC
Iron: 25 ug/dL — ABNORMAL LOW (ref 28–170)
Saturation Ratios: 11 % (ref 10.4–31.8)
TIBC: 232 ug/dL — ABNORMAL LOW (ref 250–450)
UIBC: 207 ug/dL

## 2018-08-11 LAB — FERRITIN: Ferritin: 157 ng/mL (ref 11–307)

## 2018-08-11 MED ORDER — INSULIN ASPART 100 UNIT/ML ~~LOC~~ SOLN
0.0000 [IU] | Freq: Three times a day (TID) | SUBCUTANEOUS | Status: DC
  Administered 2018-08-11: 2 [IU] via SUBCUTANEOUS
  Administered 2018-08-11: 3 [IU] via SUBCUTANEOUS

## 2018-08-11 MED ORDER — INSULIN ASPART 100 UNIT/ML ~~LOC~~ SOLN
0.0000 [IU] | Freq: Every day | SUBCUTANEOUS | Status: DC
  Administered 2018-08-11: 3 [IU] via SUBCUTANEOUS

## 2018-08-11 MED ORDER — TRAZODONE HCL 50 MG PO TABS
50.0000 mg | ORAL_TABLET | Freq: Once | ORAL | Status: AC
  Administered 2018-08-11: 50 mg via ORAL
  Filled 2018-08-11: qty 1

## 2018-08-11 MED ORDER — MENTHOL 3 MG MT LOZG
1.0000 | LOZENGE | OROMUCOSAL | Status: DC | PRN
  Filled 2018-08-11: qty 9

## 2018-08-11 MED ORDER — CALCIUM CARBONATE ANTACID 500 MG PO CHEW
1.0000 | CHEWABLE_TABLET | Freq: Once | ORAL | Status: AC
  Administered 2018-08-11: 200 mg via ORAL
  Filled 2018-08-11: qty 1

## 2018-08-11 MED ORDER — SODIUM CHLORIDE 0.9 % IV SOLN
INTRAVENOUS | Status: DC
  Administered 2018-08-11 – 2018-08-12 (×2): via INTRAVENOUS

## 2018-08-11 NOTE — H&P (Signed)
History and Physical    Monique Holland ZOX:[id number] DOB: 1965/06/22 DOA: 08/10/2018  PCP: Mikael Spray, NP Patient coming from: Urology office  Chief Complaint: Fall, flank pain  HPI: Monique Holland is a 53 y.o. female with medical history significant of ureteral calculi, hypertension, asthma, GERD, anxiety, depression, type 2 diabetes, and conditions listed below presenting to the hospital for evaluation of fall and right flank pain.  Patient states she was at her urologist's office this morning and while trying to get on the examination table she fell.  States she placed both of her feet on the little step attached to the table in an attempt to climb but the entire table flipped over and she fell on her face.  She experienced pain in her left knee after the fall.  Also slight discomfort in her left forearm.  Denies having any pain at present.  Denies any dizziness, chest pain, or shortness of breath prior to the fall.  Reports chronic intermittent right flank pain secondary to a kidney stone but symptoms have been worse for the past 3 to 4 weeks.  She has been vomiting intermittently.  Denies any fevers or chills.  Denies dysuria or trouble urinating.  Review of Systems: As per HPI otherwise 10 point review of systems negative.  Past Medical History:  Diagnosis Date  . Anxiety   . Asthma   . Depression   . Diabetes mellitus without complication (HCC)   . Frequency of urination   . GERD (gastroesophageal reflux disease)   . History of asthma    last episode yrs ago  . Hypertension   . OSA (obstructive sleep apnea)    pt had study done oct 2014--  schedule for cpap titrate after kidney stone surgery  . Schizophrenia (HCC)   . Ureteral calculi    BILATERAL  . Wears glasses     Past Surgical History:  Procedure Laterality Date  . CESAREAN SECTION  1991  &  2002   w/ bilateral tubal ligation in 2002  . CYSTOSCOPY WITH RETROGRADE PYELOGRAM, URETEROSCOPY AND STENT PLACEMENT Bilateral  05/02/2013   Procedure: CYSTOSCOPY WITH RETROGRADE PYELOGRAM, URETEROSCOPY ;  Surgeon: Valetta Fuller, MD;  Location: [facility];  Service: Urology;  Laterality: Bilateral;  . CYSTOSCOPY WITH STENT PLACEMENT Left 05/02/2013   Procedure: CYSTOSCOPY WITH STENT PLACEMENT;  Surgeon: Valetta Fuller, MD;  Location: [facility];  Service: Urology;  Laterality: Left;  . HOLMIUM LASER APPLICATION Bilateral 05/02/2013   Procedure: HOLMIUM LASER APPLICATION;  Surgeon: Valetta Fuller, MD;  Location: [facility];  Service: Urology;  Laterality: Bilateral;  . LAPAROSCOPIC APPENDECTOMY N/A 05/22/2014   Procedure: APPENDECTOMY LAPAROSCOPIC;  Surgeon: Glenna Fellows, MD;  Location: [facility];  Service: General;  Laterality: N/A;  . TONSILLECTOMY  age 70     reports that she has never smoked. She has never used smokeless tobacco. She reports that she does not drink alcohol or use drugs.  No Known Allergies  History reviewed. No pertinent family history.  Prior to Admission medications   Medication Sig Start Date End Date Taking? Authorizing Provider  albuterol (VENTOLIN HFA) 108 (90 Base) MCG/ACT inhaler Inhale 1-2 puffs into the lungs every 6 (six) hours as needed for wheezing or shortness of breath.   Yes [provider]  amitriptyline (ELAVIL) 50 MG tablet Take 50 mg by mouth at bedtime.   Yes [provider]  amLODipine (NORVASC) 10 MG tablet Take 10 mg by mouth every  morning.    Yes [provider]  clonazePAM (KLONOPIN) 1 MG tablet Take 1 mg by mouth 2 (two) times daily as needed for anxiety.   Yes [provider]  diphenhydrAMINE HCl, Sleep, (ZZZQUIL) 25 MG CAPS Take 25 mg by mouth at bedtime as needed (sleep).   Yes [provider]  lisinopril-hydrochlorothiazide (PRINZIDE,ZESTORETIC) 20-25 MG per tablet Take 1 tablet by mouth daily.   Yes [provider]  oxyCODONE (OXYCONTIN) 10 mg 12 hr tablet Take  10 mg by mouth 4 (four) times daily as needed (pain).    Yes [provider]  sertraline (ZOLOFT) 100 MG tablet Take 100 mg by mouth daily.   Yes [provider]  benzonatate (TESSALON) 100 MG capsule Take 1 capsule (100 mg total) by mouth 3 (three) times daily as needed for cough. Patient not taking: Reported on 08/10/2018 05/18/18   Michela Pitcher A, PA-C  cephALEXin (KEFLEX) 500 MG capsule Take 1 capsule (500 mg total) by mouth 2 (two) times daily for 7 days. 08/04/18 08/11/18  Robinson, [place] N, PA-C  fluticasone (FLONASE) 50 MCG/ACT nasal spray Place 2 sprays into both nostrils daily. Patient not taking: Reported on 08/10/2018 05/18/18   Michela Pitcher A, PA-C  HYDROcodone-acetaminophen (NORCO/VICODIN) 5-325 MG per tablet Take 1 tablet by mouth every 6 (six) hours as needed. Patient not taking: Reported on 03/01/2016 02/12/15   Angelina Ok, MD  ondansetron (ZOFRAN ODT) 4 MG disintegrating tablet Take 1 tablet (4 mg total) by mouth every 8 (eight) hours as needed for nausea or vomiting. 08/04/18   Robinson, [place] N, PA-C    Physical Exam: Vitals:   08/10/18 2145 08/10/18 2159 08/10/18 2200 08/10/18 2220  BP: 103/66 111/68  111/68  Pulse: 89 87  87  Resp: Temp: 98 F (36.7 C) 98.6 F (37 C) 98.6 F (37 C) 98.6 F (37 C)  SpO2: 97% 98%  97%  Weight:    (!) 147.4 kg  Height:     (1.727 m)    Physical Exam  Constitutional: She is oriented to person, place, and time. She appears well-developed and well-nourished. No distress.  HENT:  Head: Normocephalic.  Mouth/Throat: Oropharynx is clear and moist.  Eyes: Right eye exhibits no discharge. Left eye exhibits no discharge.  Cardiovascular: Normal rate, regular rhythm and intact distal pulses.  Pulmonary/Chest: Effort normal and breath sounds normal. No respiratory distress. She has no wheezes. She has no rales.  Abdominal: Soft. Bowel sounds are normal. She exhibits no distension. There is no abdominal  tenderness. There is no rebound and no guarding.  Musculoskeletal:        General: No edema.     Comments: Left forearm: No obvious deformity.  Nontender to palpation.  Normal range of motion. Left knee: No abrasion or obvious deformity.  Neurological: She is alert and oriented to person, place, and time.  Skin: Skin is warm and dry. She is not diaphoretic.     Labs on Admission: I have personally reviewed following labs and imaging studies  CBC: Recent Labs  Lab 08/04/18 1558 08/10/18 1546  WBC 10.3 11.1*  NEUTROABS  --  8.3*  HGB 10.0* 8.7*  HCT 34.8* 30.8*  MCV 75.8* 77.2*  PLT 392 293   Basic Metabolic Panel: Recent Labs  Lab 08/04/18 1558 08/10/18 1546  NA 133* 132*  K 4.6 4.8  CL 102 102  CO2 22 21*  GLUCOSE 142* 214*  BUN 32* 23*  CREATININE 1.82* 2.43*  CALCIUM 11.7* 11.2*   GFR: Estimated Creatinine Clearance: 42.1 mL/min (A) (by C-G formula based on SCr of 2.43 mg/dL (H)). Liver Function Tests: Recent Labs  Lab 08/04/18 1558 08/10/18 1546  AST 16 23  ALT 14 20  ALKPHOS 54 77  BILITOT 0.5 0.6  PROT 7.8 7.7  ALBUMIN 3.6 3.2*   Recent Labs  Lab 08/04/18 1558 08/10/18 1546  LIPASE 21 26   No results for input(s): AMMONIA in the last 168 hours. Coagulation Profile: No results for input(s): INR, PROTIME in the last 168 hours. Cardiac Enzymes: No results for input(s): CKTOTAL, CKMB, CKMBINDEX, TROPONINI in the last 168 hours. BNP (last 3 results) No results for input(s): PROBNP in the last 8760 hours. HbA1C: No results for input(s): HGBA1C in the last 72 hours. CBG: Recent Labs  Lab 08/04/18 1553  GLUCAP 114*   Lipid Profile: No results for input(s): CHOL, HDL, LDLCALC, TRIG, CHOLHDL, LDLDIRECT in the last 72 hours. Thyroid Function Tests: No results for input(s): TSH, T4TOTAL, FREET4, T3FREE, THYROIDAB in the last 72 hours. Anemia Panel: No results for input(s): VITAMINB12, FOLATE, FERRITIN, TIBC, IRON, RETICCTPCT in the last 72  hours. Urine analysis:    Component Value Date/Time   COLORURINE YELLOW 08/10/2018 2024   APPEARANCEUR CLOUDY (A) 08/10/2018 2024   LABSPEC 1.014 08/10/2018 2024   PHURINE 5.0 08/10/2018 2024   GLUCOSEU NEGATIVE 08/10/2018 2024   HGBUR MODERATE (A) 08/10/2018 2024   BILIRUBINUR NEGATIVE 08/10/2018 2024   KETONESUR NEGATIVE 08/10/2018 2024   PROTEINUR NEGATIVE 08/10/2018 2024   UROBILINOGEN 0.2 02/12/2015 1348   NITRITE NEGATIVE 08/10/2018 2024   LEUKOCYTESUR LARGE (A) 08/10/2018 2024    Radiological Exams on Admission: Dg Chest 1 View  Result Date: 08/10/2018 CLINICAL DATA:  Fall at doctor's office with head injury. Knee pain. EXAM: CHEST  1 VIEW COMPARISON:  Radiographs 05/18/2018 and 07/23/2017. FINDINGS: 1631 hours. Lesser degree of inspiration with patchy right basilar pulmonary opacity, probably atelectasis. There is no edema, confluent airspace opacity, pleural effusion or pneumothorax. No acute osseous findings are evident. Telemetry leads overlie the chest. IMPRESSION: Patchy right basilar opacity likely represents atelectasis given the lesser degree of inspiration. No acute findings. Electronically Signed   By: Carey Bullocks M.D.   On: 08/10/2018 17:13   Dg Abdomen 1 View  Result Date: 08/10/2018 CLINICAL DATA:  Fall at doctor's office with head injury. Knee pain. Known kidney stone. EXAM: ABDOMEN - 1 VIEW COMPARISON:  CT abdomen 08/04/2018. FINDINGS: The bowel gas pattern is normal. Bilateral nephrolithiasis again noted. The previously demonstrated proximal right ureteral calculus is likely positioned over the lower sacrum. No acute osseous findings. Mild lumbar spondylosis. IMPRESSION: Apparent interval distal passage of the proximal right ureteral calculus, now overlapping the sacrum, consistent with location in the distal ureter. Bilateral nephrolithiasis. Electronically Signed   By: Carey Bullocks M.D.   On: 08/10/2018 17:18   Ct Head Wo Contrast  Result Date:  08/10/2018 CLINICAL DATA:  Larey Seat and hit head with left-sided swelling EXAM: CT HEAD WITHOUT CONTRAST CT MAXILLOFACIAL WITHOUT CONTRAST CT CERVICAL SPINE WITHOUT CONTRAST TECHNIQUE: Multidetector CT imaging of the head, cervical spine, and maxillofacial structures were performed using the standard protocol without intravenous contrast. Multiplanar CT image reconstructions of the cervical spine and maxillofacial structures were also generated. COMPARISON:  None. FINDINGS: CT HEAD FINDINGS Brain: No acute territorial infarction, hemorrhage, or intracranial mass. Mild atrophy. Normal ventricle size. Vascular: No hyperdense vessel.  No unexpected calcification Skull: No depressed  skull fracture Other: None CT MAXILLOFACIAL FINDINGS Osseous: No acute nasal bone fracture. Bilateral mandibular heads are normally position. No mandibular fracture. Mastoid air cells are clear. Pterygoid plates and zygomatic arches are intact. Prominent root lucency right maxillary central incisor. Orbits: Negative. No traumatic or inflammatory finding. Sinuses: Clear. Soft tissues: Negative. CT CERVICAL SPINE FINDINGS Alignment: Straightening of the cervical spine. No subluxation. Facet alignment maintained Skull base and vertebrae: No acute fracture. No primary bone lesion or focal pathologic process. Soft tissues and spinal canal: No prevertebral fluid or swelling. No visible canal hematoma. Disc levels:  Mild diffuse degenerative changes C4 through C7. Upper chest: Negative. Other: None IMPRESSION: 1. No CT evidence for acute intracranial abnormality.  Mild atrophy 2. No acute facial bone fracture 3. Straightening of the cervical spine without acute osseous abnormality. Electronically Signed   By: Jasmine Pang M.D.   On: 08/10/2018 16:48   Ct Cervical Spine Wo Contrast  Result Date: 08/10/2018 CLINICAL DATA:  Larey Seat and hit head with left-sided swelling EXAM: CT HEAD WITHOUT CONTRAST CT MAXILLOFACIAL WITHOUT CONTRAST CT CERVICAL SPINE  WITHOUT CONTRAST TECHNIQUE: Multidetector CT imaging of the head, cervical spine, and maxillofacial structures were performed using the standard protocol without intravenous contrast. Multiplanar CT image reconstructions of the cervical spine and maxillofacial structures were also generated. COMPARISON:  None. FINDINGS: CT HEAD FINDINGS Brain: No acute territorial infarction, hemorrhage, or intracranial mass. Mild atrophy. Normal ventricle size. Vascular: No hyperdense vessel.  No unexpected calcification Skull: No depressed skull fracture Other: None CT MAXILLOFACIAL FINDINGS Osseous: No acute nasal bone fracture. Bilateral mandibular heads are normally position. No mandibular fracture. Mastoid air cells are clear. Pterygoid plates and zygomatic arches are intact. Prominent root lucency right maxillary central incisor. Orbits: Negative. No traumatic or inflammatory finding. Sinuses: Clear. Soft tissues: Negative. CT CERVICAL SPINE FINDINGS Alignment: Straightening of the cervical spine. No subluxation. Facet alignment maintained Skull base and vertebrae: No acute fracture. No primary bone lesion or focal pathologic process. Soft tissues and spinal canal: No prevertebral fluid or swelling. No visible canal hematoma. Disc levels:  Mild diffuse degenerative changes C4 through C7. Upper chest: Negative. Other: None IMPRESSION: 1. No CT evidence for acute intracranial abnormality.  Mild atrophy 2. No acute facial bone fracture 3. Straightening of the cervical spine without acute osseous abnormality. Electronically Signed   By: Jasmine Pang M.D.   On: 08/10/2018 16:48   Dg Knee Complete 4 Views Left  Result Date: 08/10/2018 CLINICAL DATA:  Fall at doctor's office with head injury. Knee pain. EXAM: LEFT KNEE - COMPLETE 4+ VIEW COMPARISON:  Radiographs 09/05/2017 and 10/08/2015. FINDINGS: The mineralization and alignment are normal. There is no evidence of acute fracture or dislocation. Age advanced tricompartmental  osteoarthritis is again noted with severe joint space narrowing and osteophyte formation in the medial and patellofemoral compartments. No significant joint effusion. IMPRESSION: No acute osseous findings. Advanced tricompartmental osteoarthritis. Electronically Signed   By: Carey Bullocks M.D.   On: 08/10/2018 17:16   Dg C-arm 1-60 Min-no Report  Result Date: 08/10/2018 Fluoroscopy was utilized by the requesting physician.  No radiographic interpretation.   Ct Maxillofacial Wo Contrast  Result Date: 08/10/2018 CLINICAL DATA:  Larey Seat and hit head with left-sided swelling EXAM: CT HEAD WITHOUT CONTRAST CT MAXILLOFACIAL WITHOUT CONTRAST CT CERVICAL SPINE WITHOUT CONTRAST TECHNIQUE: Multidetector CT imaging of the head, cervical spine, and maxillofacial structures were performed using the standard protocol without intravenous contrast. Multiplanar CT image reconstructions of the cervical spine and maxillofacial structures  were also generated. COMPARISON:  None. FINDINGS: CT HEAD FINDINGS Brain: No acute territorial infarction, hemorrhage, or intracranial mass. Mild atrophy. Normal ventricle size. Vascular: No hyperdense vessel.  No unexpected calcification Skull: No depressed skull fracture Other: None CT MAXILLOFACIAL FINDINGS Osseous: No acute nasal bone fracture. Bilateral mandibular heads are normally position. No mandibular fracture. Mastoid air cells are clear. Pterygoid plates and zygomatic arches are intact. Prominent root lucency right maxillary central incisor. Orbits: Negative. No traumatic or inflammatory finding. Sinuses: Clear. Soft tissues: Negative. CT CERVICAL SPINE FINDINGS Alignment: Straightening of the cervical spine. No subluxation. Facet alignment maintained Skull base and vertebrae: No acute fracture. No primary bone lesion or focal pathologic process. Soft tissues and spinal canal: No prevertebral fluid or swelling. No visible canal hematoma. Disc levels:  Mild diffuse degenerative  changes C4 through C7. Upper chest: Negative. Other: None IMPRESSION: 1. No CT evidence for acute intracranial abnormality.  Mild atrophy 2. No acute facial bone fracture 3. Straightening of the cervical spine without acute osseous abnormality. Electronically Signed   By: Jasmine Pang M.D.   On: 08/10/2018 16:48    EKG: Independently reviewed.  Sinus rhythm.  Assessment/Plan Principal Problem:   Ureteral calculus, right Active Problems:   Severe sepsis (HCC)   AKI (acute kidney injury) (HCC)   Hypotension   Type 2 diabetes mellitus (HCC)   Severe sepsis secondary to obstructing ureteral calculus Afebrile.  White count mildly elevated at 11.1.  Not tachycardic, intermittently tachypneic.  Hypotensive on arrival, blood pressure improved with 1 L fluid bolus.  Lactic acid 2.7 >> 1.8.  UA with moderate hemoglobin, greater than 50 RBCs, large amount of leukocytes, greater than 50 WBCs, and rare bacteria.  Recent urine culture done at urology office showing mixed growth and 10,000 CFU of yeast.  KUB with apparent interval distal passage of the proximal right ureteral calculus, now overlapping the sacrum, consistent with location in the distal ureter.  Urology was consulted and patient underwent urgent right ureteral stent placement.  Foley catheter placed. -Continue ceftriaxone -IV fluid -Norco PRN pain, Tylenol PRN -Zofran PRN nausea -Blood culture x2 -Urine culture -Continue to monitor CBC  AKI secondary to obstructive uropathy -BUN 23.  Creatinine 2.4, baseline 0.9. -IV fluid -Monitor urine output -Avoid nephrotoxic agents -Continue to monitor renal function  Hypotension/fall Patient reports mechanical fall, however, was hypotensive on arrival.  Blood pressure improved with 1 L IV fluid bolus.  CT head, C-spine, and maxillofacial negative for acute finding.  X-ray of left knee negative for acute finding.  Patient reports slight discomfort in her left forearm after the fall but not at  present.  No obvious deformity noted on exam.  Normal range of motion and no tenderness. -IV fluid hydration -Monitor blood pressure   Acute on chronic microcytic anemia -Hemoglobin 8.7, baseline in the 10 range.  Patient is not sure if she is having melena or hematochezia.  No signs of active bleeding. -Check FOBT -Check iron, ferritin, TIBC  Constipation -MiraLAX  Anxiety -Continue home Klonopin as needed  Depression -Continue Zoloft  Diet controlled type 2 diabetes -Random blood glucose 214. -Check A1c.  Sliding scale insulin and CBG checks.  Asthma -Stable.  No bronchospasm.  Albuterol nebulizer as needed.  DVT prophylaxis: Lovenox Code Status: Full code Family Communication: No family available Disposition Plan: Anticipate discharge after clinical improvement  Consults called: Urology (Dr. Mena Goes) Admission status: Observation  This chart was dictated using voice recognition software.  Despite best efforts to proofread, errors  can occur which can change the documentation meaning.  John Giovanni MD [facility] Pager [phone]  If 7PM-7AM, please contact night-coverage [email or web address] Password [email or web address]  08/11/2018, 1:22 AM

## 2018-08-11 NOTE — Progress Notes (Signed)
I assumed care of this pt at 1300 and I agree with the assessment completed by the previous RN.

## 2018-08-11 NOTE — Anesthesia Postprocedure Evaluation (Signed)
Anesthesia Post Note  Patient: Monique Holland  Procedure(s) Performed: CYSTOSCOPY WITH RETROGRADE PYELOGRAM/URETERAL STENT PLACEMENT (Right )     Patient location during evaluation: PACU Anesthesia Type: General Level of consciousness: awake and alert Pain management: pain level controlled Vital Signs Assessment: post-procedure vital signs reviewed and stable Respiratory status: spontaneous breathing, nonlabored ventilation and respiratory function stable Cardiovascular status: blood pressure returned to baseline and stable Postop Assessment: no apparent nausea or vomiting Anesthetic complications: no         Kaylyn Layer

## 2018-08-11 NOTE — Care Management Obs Status (Signed)
MEDICARE OBSERVATION STATUS NOTIFICATION   Patient Details  Name: Monique Holland MRN: [id number] Date of Birth: [birth year]   Medicare Observation Status Notification Given:  Yes  Patient signed paper copy and placed on chart  Elliot Cousin, RN 08/11/2018, 5:03 PM

## 2018-08-11 NOTE — Progress Notes (Signed)
Patient seen and examined at bedside, patient admitted after midnight, please see earlier detailed admission note by John Giovanni, MD. Briefly, patient presented with flank pain and found to have an obstructing ureteral calculus. Concern for sepsis. Started on antibiotics and cultures are pending. S/p urgent right ureteral stent placement. Currently with continued hypotension on IV fluids.  -Continue IV fluids -Follow-up blood/urine cultures -Urology recommendations  Jacquelin Hawking, MD [facility] 08/11/2018, 2:16 PM

## 2018-08-12 DIAGNOSIS — N178 Other acute kidney failure: Secondary | ICD-10-CM | POA: Diagnosis not present

## 2018-08-12 DIAGNOSIS — A419 Sepsis, unspecified organism: Secondary | ICD-10-CM

## 2018-08-12 DIAGNOSIS — N179 Acute kidney failure, unspecified: Secondary | ICD-10-CM | POA: Diagnosis not present

## 2018-08-12 DIAGNOSIS — R6520 Severe sepsis without septic shock: Secondary | ICD-10-CM

## 2018-08-12 DIAGNOSIS — N201 Calculus of ureter: Secondary | ICD-10-CM | POA: Diagnosis not present

## 2018-08-12 LAB — CBC
HCT: 25.7 % — ABNORMAL LOW (ref 36.0–46.0)
Hemoglobin: 7.5 g/dL — ABNORMAL LOW (ref 12.0–15.0)
MCH: 22.3 pg — ABNORMAL LOW (ref 26.0–34.0)
MCHC: 29.2 g/dL — ABNORMAL LOW (ref 30.0–36.0)
MCV: 76.5 fL — ABNORMAL LOW (ref 80.0–100.0)
NRBC: 0 % (ref 0.0–0.2)
Platelets: 298 10*3/uL (ref 150–400)
RBC: 3.36 MIL/uL — ABNORMAL LOW (ref 3.87–5.11)
RDW: 15.8 % — ABNORMAL HIGH (ref 11.5–15.5)
WBC: 6.7 10*3/uL (ref 4.0–10.5)

## 2018-08-12 LAB — BASIC METABOLIC PANEL
Anion gap: 6 (ref 5–15)
BUN: 17 mg/dL (ref 6–20)
CO2: 21 mmol/L — ABNORMAL LOW (ref 22–32)
Calcium: 10.1 mg/dL (ref 8.9–10.3)
Chloride: 110 mmol/L (ref 98–111)
Creatinine, Ser: 1.24 mg/dL — ABNORMAL HIGH (ref 0.44–1.00)
GFR calc non Af Amer: 50 mL/min — ABNORMAL LOW (ref >60)
GFR, EST AFRICAN AMERICAN: 58 mL/min — AB (ref >60)
Glucose, Bld: 137 mg/dL — ABNORMAL HIGH (ref 70–99)
Potassium: 4 mmol/L (ref 3.5–5.1)
Sodium: 137 mmol/L (ref 135–145)

## 2018-08-12 LAB — URINE CULTURE: Culture: NO GROWTH

## 2018-08-12 LAB — GLUCOSE, CAPILLARY
Glucose-Capillary: 76 mg/dL (ref 70–99)
Glucose-Capillary: 89 mg/dL (ref 70–99)

## 2018-08-12 MED ORDER — CEFDINIR 300 MG PO CAPS: 300.0000 mg | ORAL_CAPSULE | Freq: Two times a day (BID) | ORAL | 0 refills | Status: AC

## 2018-08-12 NOTE — Progress Notes (Signed)
Foley cath was removed and pt voiding without difficulty. PIV removed and pt was given written discharge instructions. All instructions reviewed with pt. She  Denies any questions or concerns. Pt tolerated well and discharged to home with husband

## 2018-08-12 NOTE — Progress Notes (Signed)
Nutrition Brief Note  Patient identified on the Malnutrition Screening Tool (MST) Report  Patient's weight is now consistent with weight from 1 year ago. Pt currently consuming 100% of meals.  Wt Readings from Last 15 Encounters:  08/10/18 (!) 147.4 kg  08/04/18 (!) 152.9 kg  09/09/17 (!) 156 kg  07/23/17 (!) 147.4 kg  01/25/15 (!) 167.8 kg  05/22/14 (!) 149.7 kg  03/16/14 (!) 149.7 kg  05/02/13 (!) 148.8 kg  10/30/12 (!) 149.7 kg    Body mass index is 49.42 kg/m. Patient meets criteria for morbid obesity based on current BMI.   Current diet order is regular, patient is consuming approximately 100% of meals at this time. Labs and medications reviewed.   No nutrition interventions warranted at this time. If nutrition issues arise, please consult RD.   Tilda Franco, MS, RD, LDN [facility] Inpatient Clinical Dietitian Pager: [phone] After Hours Pager: [phone]

## 2018-08-12 NOTE — Progress Notes (Signed)
2 Days Post-Op Subjective: Patient reports she is feeling well.  Much better..  Objective: Vital signs in last 24 hours: Temp:  [98.2 F (36.8 C)-99.3 F (37.4 C)] 98.2 F (36.8 C) (03/22 0559) Pulse Rate:  [91-96] 92 (03/22 0559) Resp:  [12-22] 12 (03/22 0559) BP: (84-109)/(49-73) 109/73 (03/22 0559) SpO2:  [99 %-100 %] 99 % (03/22 0559)  Intake/Output from previous day: 03/21 0701 - 03/22 0700 In: 877.2 [P.O.:240; I.V.:537.2; IV Piggyback:100] Out: 2400 [Urine:2400] Intake/Output this shift: Total I/O In: 1589.4 [I.V.:1529.1; IV Piggyback:60.2] Out: 3800 [Urine:3800]  Physical Exam:  She is alert and oriented Urine clear in tubing   Lab Results: Recent Labs    08/10/18 1546 08/11/18 0508  HGB 8.7* 7.7*  HCT 30.8* 26.9*   BMET Recent Labs    08/10/18 1546 08/11/18 0508  NA 132* 135  K 4.8 5.2*  CL 102 107  CO2 21* 20*  GLUCOSE 214* 262*  BUN 23* 26*  CREATININE 2.43* 2.03*  CALCIUM 11.2* 10.5*   No results for input(s): LABPT, INR in the last 72 hours. No results for input(s): LABURIN in the last 72 hours. Results for orders placed or performed during the hospital encounter of 08/10/18  Blood culture (routine x 2)     Status: None (Preliminary result)   Collection Time: 08/10/18  3:36 PM  Result Value Ref Range Status   Specimen Description   Final    BLOOD RIGHT ANTECUBITAL Performed at [facility], [address], [place], [place] [address]    Special Requests   Final    BOTTLES DRAWN AEROBIC AND ANAEROBIC Blood Culture results may not be optimal due to an inadequate volume of blood received in culture bottles Performed at [facility], [address], [place], [place] [address]    Culture   Final    NO GROWTH 2 DAYS Performed at [facility], [address], [place], [place] [address]    Report Status PENDING  Incomplete  Blood culture (routine x 2)     Status: None (Preliminary result)    Collection Time: 08/10/18  3:41 PM  Result Value Ref Range Status   Specimen Description   Final    BLOOD LEFT ANTECUBITAL Performed at [facility], [address], [place], [place] [address]    Special Requests   Final    BOTTLES DRAWN AEROBIC AND ANAEROBIC Blood Culture adequate volume Performed at [facility], [address], [place], [place] [address]    Culture   Final    NO GROWTH 2 DAYS Performed at [facility], [address], [place], [place] [address]    Report Status PENDING  Incomplete  Urine Culture     Status: None   Collection Time: 08/10/18  8:24 PM  Result Value Ref Range Status   Specimen Description   Final    CYSTOSCOPY Performed at [facility], [address] [place], [place] [address]    Special Requests   Final    NONE Performed at [facility], [address] [place], [place] [address]    Culture   Final    NO GROWTH Performed at [facility], [address] [place], [place] [address]    Report Status 08/12/2018 FINAL  Final    Studies/Results: Dg Chest 1 View  Result Date: 08/10/2018 CLINICAL DATA:  Fall at doctor's office with head injury. Knee pain. EXAM: CHEST  1 VIEW COMPARISON:  Radiographs 05/18/2018 and 07/23/2017. FINDINGS: 1631 hours. Lesser degree of inspiration with patchy right basilar pulmonary opacity, probably atelectasis. There is no edema, confluent airspace opacity, pleural effusion or pneumothorax. No acute osseous findings are evident. Telemetry leads overlie the chest. IMPRESSION: Patchy right basilar opacity likely represents atelectasis given the lesser degree of inspiration. No acute findings. Electronically Signed   By: Carey Bullocks M.D.   On: 08/10/2018 17:13   Dg Abdomen 1 View  Result Date: 08/10/2018 CLINICAL DATA:  Fall at doctor's office with head injury. Knee pain. Known kidney stone. EXAM: ABDOMEN - 1 VIEW COMPARISON:  CT  abdomen 08/04/2018. FINDINGS: The bowel gas pattern is normal. Bilateral nephrolithiasis again noted. The previously demonstrated proximal right ureteral calculus is likely positioned over the lower sacrum. No acute osseous findings. Mild lumbar spondylosis. IMPRESSION: Apparent interval distal passage of the proximal right ureteral calculus, now overlapping the sacrum, consistent with location in the distal ureter. Bilateral nephrolithiasis. Electronically Signed   By: Carey Bullocks M.D.   On: 08/10/2018 17:18   Ct Head Wo Contrast  Result Date: 08/10/2018 CLINICAL DATA:  Larey Seat and hit head with left-sided swelling EXAM: CT HEAD WITHOUT CONTRAST CT MAXILLOFACIAL WITHOUT CONTRAST CT CERVICAL SPINE WITHOUT CONTRAST TECHNIQUE: Multidetector CT imaging of the head, cervical spine, and maxillofacial structures were performed using the standard protocol without intravenous contrast. Multiplanar CT image reconstructions of the cervical spine and maxillofacial structures were also generated. COMPARISON:  None. FINDINGS: CT HEAD FINDINGS Brain: No acute territorial infarction, hemorrhage, or intracranial mass. Mild atrophy. Normal ventricle size. Vascular: No hyperdense vessel.  No unexpected calcification Skull: No depressed skull fracture Other: None CT MAXILLOFACIAL FINDINGS Osseous: No acute nasal bone fracture. Bilateral mandibular heads are normally position. No mandibular fracture. Mastoid air cells are clear. Pterygoid plates and zygomatic arches are intact. Prominent root lucency right maxillary central incisor. Orbits: Negative. No traumatic or inflammatory finding. Sinuses: Clear. Soft tissues: Negative. CT CERVICAL SPINE FINDINGS Alignment: Straightening of the cervical spine. No subluxation. Facet alignment maintained Skull base and vertebrae: No acute fracture. No primary bone lesion or focal pathologic process. Soft tissues and spinal canal: No prevertebral fluid or swelling. No visible canal hematoma.  Disc levels:  Mild diffuse degenerative changes C4 through C7. Upper chest: Negative. Other: None IMPRESSION: 1. No CT evidence for acute intracranial abnormality.  Mild atrophy 2. No acute facial bone fracture 3. Straightening of the cervical spine without acute osseous abnormality. Electronically Signed   By: Jasmine Pang M.D.   On: 08/10/2018 16:48   Ct Cervical Spine Wo Contrast  Result Date: 08/10/2018 CLINICAL DATA:  Larey Seat and hit head with left-sided swelling EXAM: CT HEAD WITHOUT CONTRAST CT MAXILLOFACIAL WITHOUT CONTRAST CT CERVICAL SPINE WITHOUT CONTRAST TECHNIQUE: Multidetector CT imaging of the head, cervical spine, and maxillofacial structures were performed using the standard protocol without intravenous contrast. Multiplanar CT image reconstructions of the cervical spine and maxillofacial structures were also generated. COMPARISON:  None. FINDINGS: CT HEAD FINDINGS Brain: No acute territorial infarction, hemorrhage, or intracranial mass. Mild atrophy. Normal ventricle size. Vascular: No hyperdense vessel.  No unexpected calcification Skull: No depressed skull fracture Other: None CT MAXILLOFACIAL FINDINGS Osseous: No acute nasal bone fracture. Bilateral mandibular heads are normally position. No mandibular fracture. Mastoid air cells are clear. Pterygoid plates and zygomatic arches are intact. Prominent root lucency right maxillary central incisor. Orbits: Negative. No traumatic or inflammatory finding. Sinuses: Clear. Soft tissues: Negative. CT CERVICAL SPINE FINDINGS Alignment: Straightening of  the cervical spine. No subluxation. Facet alignment maintained Skull base and vertebrae: No acute fracture. No primary bone lesion or focal pathologic process. Soft tissues and spinal canal: No prevertebral fluid or swelling. No visible canal hematoma. Disc levels:  Mild diffuse degenerative changes C4 through C7. Upper chest: Negative. Other: None IMPRESSION: 1. No CT evidence for acute intracranial  abnormality.  Mild atrophy 2. No acute facial bone fracture 3. Straightening of the cervical spine without acute osseous abnormality. Electronically Signed   By: Jasmine Pang M.D.   On: 08/10/2018 16:48   Dg Knee Complete 4 Views Left  Result Date: 08/10/2018 CLINICAL DATA:  Fall at doctor's office with head injury. Knee pain. EXAM: LEFT KNEE - COMPLETE 4+ VIEW COMPARISON:  Radiographs 09/05/2017 and 10/08/2015. FINDINGS: The mineralization and alignment are normal. There is no evidence of acute fracture or dislocation. Age advanced tricompartmental osteoarthritis is again noted with severe joint space narrowing and osteophyte formation in the medial and patellofemoral compartments. No significant joint effusion. IMPRESSION: No acute osseous findings. Advanced tricompartmental osteoarthritis. Electronically Signed   By: Carey Bullocks M.D.   On: 08/10/2018 17:16   Dg C-arm 1-60 Min-no Report  Result Date: 08/10/2018 Fluoroscopy was utilized by the requesting physician.  No radiographic interpretation.   Ct Maxillofacial Wo Contrast  Result Date: 08/10/2018 CLINICAL DATA:  Larey Seat and hit head with left-sided swelling EXAM: CT HEAD WITHOUT CONTRAST CT MAXILLOFACIAL WITHOUT CONTRAST CT CERVICAL SPINE WITHOUT CONTRAST TECHNIQUE: Multidetector CT imaging of the head, cervical spine, and maxillofacial structures were performed using the standard protocol without intravenous contrast. Multiplanar CT image reconstructions of the cervical spine and maxillofacial structures were also generated. COMPARISON:  None. FINDINGS: CT HEAD FINDINGS Brain: No acute territorial infarction, hemorrhage, or intracranial mass. Mild atrophy. Normal ventricle size. Vascular: No hyperdense vessel.  No unexpected calcification Skull: No depressed skull fracture Other: None CT MAXILLOFACIAL FINDINGS Osseous: No acute nasal bone fracture. Bilateral mandibular heads are normally position. No mandibular fracture. Mastoid air cells are  clear. Pterygoid plates and zygomatic arches are intact. Prominent root lucency right maxillary central incisor. Orbits: Negative. No traumatic or inflammatory finding. Sinuses: Clear. Soft tissues: Negative. CT CERVICAL SPINE FINDINGS Alignment: Straightening of the cervical spine. No subluxation. Facet alignment maintained Skull base and vertebrae: No acute fracture. No primary bone lesion or focal pathologic process. Soft tissues and spinal canal: No prevertebral fluid or swelling. No visible canal hematoma. Disc levels:  Mild diffuse degenerative changes C4 through C7. Upper chest: Negative. Other: None IMPRESSION: 1. No CT evidence for acute intracranial abnormality.  Mild atrophy 2. No acute facial bone fracture 3. Straightening of the cervical spine without acute osseous abnormality. Electronically Signed   By: Jasmine Pang M.D.   On: 08/10/2018 16:48    Assessment/Plan:  Sepsis picture, right mid ureteral stone-status post right ureteral stent.  Urine and blood cultures negative.  A lot of her issue may have been severe dehydration as well.  She will go home on antibiotics and I will let Dr. Alvester Morin know of her admission.  If she remains stable she could likely go for ureteroscopy Wednesday.  I discussed with Dr. Caleb Popp.   LOS: 0 days   Jerilee Field 08/12/2018, 11:16 AM

## 2018-08-12 NOTE — Discharge Instructions (Signed)
Monique Holland,  You were in the hospital and had a stent placed in your ureter. In addition, you have been started on antibiotics. Your blood pressure was low, likely because of dehydration. This caused some kidney injury which is significantly improved. I recommend you hold your lisinopril-hydrochlorothiazide and amlodipine because of your lower blood pressure and kidney injury. Please check your blood pressure daily. If your blood pressure started to back up to greater than 120/80, you can restart your blood pressure medications. Please follow-up with your primary care physician about your blood pressure.   Ureteral Stent Implantation, Care After Refer to this sheet in the next few weeks. These instructions provide you with information about caring for yourself after your procedure. Your health care provider may also give you more specific instructions. Your treatment has been planned according to current medical practices, but problems sometimes occur. Call your health care provider if you have any problems or questions after your procedure. What can I expect after the procedure? After the procedure, it is common to have:  Nausea.  Mild pain when you urinate. You may feel this pain in your lower back or lower abdomen. Pain should stop within a few minutes after you urinate. This may last for up to 1 week.  A small amount of blood in your urine for several days. Follow these instructions at home:  Medicines  Take over-the-counter and prescription medicines only as told by your health care provider.  If you were prescribed an antibiotic medicine, take it as told by your health care provider. Do not stop taking the antibiotic even if you start to feel better.  Do not drive for 24 hours if you received a sedative.  Do not drive or operate heavy machinery while taking prescription pain medicines. Activity  Return to your normal activities as told by your health care provider. Ask your health  care provider what activities are safe for you.  Do not lift anything that is heavier than 10 lb (4.5 kg). Follow this limit for 1 week after your procedure, or for as long as told by your health care provider. General instructions  Watch for any blood in your urine. Call your health care provider if the amount of blood in your urine increases.  If you have a catheter: ? Follow instructions from your health care provider about taking care of your catheter and collection bag. ? Do not take baths, swim, or use a hot tub until your health care provider approves.  Drink enough fluid to keep your urine clear or pale yellow.  Keep all follow-up visits as told by your health care provider. This is important. Contact a health care provider if:  You have pain that gets worse or does not get better with medicine, especially pain when you urinate.  You have difficulty urinating.  You feel nauseous or you vomit repeatedly during a period of more than 2 days after the procedure. Get help right away if:  Your urine is dark red or has blood clots in it.  You are leaking urine (have incontinence).  The end of the stent comes out of your urethra.  You cannot urinate.  You have sudden, sharp, or severe pain in your abdomen or lower back.  You have a fever. This information is not intended to replace advice given to you by your health care provider. Make sure you discuss any questions you have with your health care provider. Document Released: 01/09/2013 Document Revised: 10/15/2015 Document Reviewed: 11/21/2014  Chartered certified accountant Patient Education  [employer].

## 2018-08-12 NOTE — Discharge Summary (Signed)
Physician Discharge Summary  Monique Holland ZOX:[id number] DOB: [birth year] DOA: 08/10/2018  PCP: Mikael Spray, NP  Admit date: 08/10/2018 Discharge date: 08/12/2018  Admitted From: Home Disposition: Home  Recommendations for Outpatient Follow-up:  1. Follow up with PCP in 1 week 2. Please obtain BMP/CBC in one week 3. Please follow up on the following pending results: Blood culture  Home Health: None Equipment/Devices: None  Discharge Condition: Stable CODE STATUS: Full code Diet recommendation: Heart healthy   Brief/Interim Summary:  Admission HPI written by John Giovanni, MD   Chief Complaint: Fall, flank pain  HPI: Monique Holland is a 53 y.o. female with medical history significant of ureteral calculi, hypertension, asthma, GERD, anxiety, depression, type 2 diabetes, and conditions listed below presenting to the hospital for evaluation of fall and right flank pain.  Patient states she was at her urologist's office this morning and while trying to get on the examination table she fell.  States she placed both of her feet on the little step attached to the table in an attempt to climb but the entire table flipped over and she fell on her face.  She experienced pain in her left knee after the fall.  Also slight discomfort in her left forearm.  Denies having any pain at present.  Denies any dizziness, chest pain, or shortness of breath prior to the fall.  Reports chronic intermittent right flank pain secondary to a kidney stone but symptoms have been worse for the past 3 to 4 weeks.  She has been vomiting intermittently.  Denies any fevers or chills.  Denies dysuria or trouble urinating.   Hospital course:  Severe sepsis Secondary to ureteral calculus. Ruling this out since likely symptoms are mostly from significant dehydration. Cultures negative but patient did receive antibiotics prior to urine culture.  Acute kidney injury Secondary to obstructive uropathy. Baseline  creatinine of 1. Peak creatinine of 2.43. Improved to 1.24 prior to discharge. Repeat BMP outpatient.  Hypotension Secondary to significant dehydration. Received IV fluids. Antihypertensives discontinued on discharge but instructions to resume if blood pressure rebounds. Outpatient follow-up.  Acute on chronic microcytic anemia Anemia panel suggests possible chronic disease/iron deficiency mix.   Constipation Given Miralax  Anxiety Continue home Klonopin  Depression Continue home Zoloft  Asthma Stable. Albuterol prn.  Discharge Diagnoses:  Principal Problem:   Ureteral calculus, right Active Problems:   Severe sepsis (HCC)   AKI (acute kidney injury) (HCC)   Hypotension   Type 2 diabetes mellitus [facility])    Discharge Instructions  Discharge Instructions    Call MD for:  severe uncontrolled pain   Complete by:  As directed    Call MD for:  temperature >100.4   Complete by:  As directed    Diet - low sodium heart healthy   Complete by:  As directed    Increase activity slowly   Complete by:  As directed      Allergies as of 08/12/2018   No Known Allergies     Medication List    STOP taking these medications   amLODipine 10 MG tablet Commonly known as:  NORVASC   benzonatate 100 MG capsule Commonly known as:  TESSALON   cephALEXin 500 MG capsule Commonly known as:  KEFLEX   fluticasone 50 MCG/ACT nasal spray Commonly known as:  FLONASE   HYDROcodone-acetaminophen 5-325 MG tablet Commonly known as:  NORCO/VICODIN   lisinopril-hydrochlorothiazide 20-25 MG tablet Commonly known as:  PRINZIDE,ZESTORETIC     TAKE these medications  amitriptyline 50 MG tablet Commonly known as:  ELAVIL Take 50 mg by mouth at bedtime.   cefdinir 300 MG capsule Commonly known as:  OMNICEF Take 1 capsule (300 mg total) by mouth 2 (two) times daily for 5 days.   clonazePAM 1 MG tablet Commonly known as:  KLONOPIN Take 1 mg by mouth 2 (two) times daily as needed for  anxiety.   ondansetron 4 MG disintegrating tablet Commonly known as:  Zofran ODT Take 1 tablet (4 mg total) by mouth every 8 (eight) hours as needed for nausea or vomiting.   OxyCONTIN 10 mg 12 hr tablet Generic drug:  oxyCODONE Take 10 mg by mouth 4 (four) times daily as needed (pain).   sertraline 100 MG tablet Commonly known as:  ZOLOFT Take 100 mg by mouth daily.   Ventolin HFA 108 (90 Base) MCG/ACT inhaler Generic drug:  albuterol Inhale 1-2 puffs into the lungs every 6 (six) hours as needed for wheezing or shortness of breath.   ZzzQuil 25 MG Caps Generic drug:  diphenhydrAMINE HCl (Sleep) Take 25 mg by mouth at bedtime as needed (sleep).      Follow-up Information    Crista ElliotBell, Eugene D III, MD.   Specialty:  Urology Why:  Keep scheduled procedure  Contact information: [address] [place] [id number] [phone]        Mikael SprayMcRae, Lauren, NP. Schedule an appointment as soon as possible for a visit in 1 week(s).   Specialty:  Gerontology Why:  Blood pressure Contact information: [address] [place] [address] [phone]          No Known Allergies  Consultations:  Urology   Procedures/Studies: Ct Abdomen Pelvis Wo Contrast  Result Date: 08/04/2018 CLINICAL DATA:  Right-sided abdominal pain and flank pain for 1 month. EXAM: CT ABDOMEN AND PELVIS WITHOUT CONTRAST TECHNIQUE: Multidetector CT imaging of the abdomen and pelvis was performed following the standard protocol without IV contrast. COMPARISON:  CT abdomen pelvis 09/09/2017 FINDINGS: Lower chest: Normal heart size.  Lung bases are clear. Hepatobiliary: Liver is diffusely low in attenuation compatible with steatosis. Gallbladder is unremarkable. No intrahepatic or extrahepatic biliary ductal dilatation. Pancreas: Unremarkable Spleen: Unremarkable Adrenals/Urinary Tract: Normal adrenal glands. Bilateral medullary nephrocalcinosis. There is a 7 mm stone within the proximal right  ureter (image 46; series 2). This results in mild-to-moderate right hydronephrosis. No left-sided ureteral stones. Urinary bladder is unremarkable. Stomach/Bowel: No abnormal bowel wall thickening or evidence for bowel obstruction. No free fluid or free intraperitoneal air. Normal morphology of the stomach. Vascular/Lymphatic: Normal caliber abdominal aorta. No retroperitoneal lymphadenopathy. Reproductive: Uterus and adnexal structures are unremarkable. Other: None. Musculoskeletal: Lumbar spine degenerative changes. Lower thoracic spine degenerative changes. No aggressive or acute appearing osseous lesions. Grade 1 anterolisthesis L4 and L5 with bilateral L4 pars defects. IMPRESSION: There is an obstructing stone (7 mm) within the proximal right ureter resulting in mild to moderate right hydronephrosis. Bilateral medullary nephrocalcinosis. Electronically Signed   By: Annia Beltrew  Davis M.D.   On: 08/04/2018 18:31   Dg Chest 1 View  Result Date: 08/10/2018 CLINICAL DATA:  Fall at doctor's office with head injury. Knee pain. EXAM: CHEST  1 VIEW COMPARISON:  Radiographs 05/18/2018 and 07/23/2017. FINDINGS: 1631 hours. Lesser degree of inspiration with patchy right basilar pulmonary opacity, probably atelectasis. There is no edema, confluent airspace opacity, pleural effusion or pneumothorax. No acute osseous findings are evident. Telemetry leads overlie the chest. IMPRESSION: Patchy right basilar opacity likely represents atelectasis given  the lesser degree of inspiration. No acute findings. Electronically Signed   By: Carey Bullocks M.D.   On: 08/10/2018 17:13   Dg Abdomen 1 View  Result Date: 08/10/2018 CLINICAL DATA:  Fall at doctor's office with head injury. Knee pain. Known kidney stone. EXAM: ABDOMEN - 1 VIEW COMPARISON:  CT abdomen 08/04/2018. FINDINGS: The bowel gas pattern is normal. Bilateral nephrolithiasis again noted. The previously demonstrated proximal right ureteral calculus is likely positioned  over the lower sacrum. No acute osseous findings. Mild lumbar spondylosis. IMPRESSION: Apparent interval distal passage of the proximal right ureteral calculus, now overlapping the sacrum, consistent with location in the distal ureter. Bilateral nephrolithiasis. Electronically Signed   By: Carey Bullocks M.D.   On: 08/10/2018 17:18   Ct Head Wo Contrast  Result Date: 08/10/2018 CLINICAL DATA:  Larey Seat and hit head with left-sided swelling EXAM: CT HEAD WITHOUT CONTRAST CT MAXILLOFACIAL WITHOUT CONTRAST CT CERVICAL SPINE WITHOUT CONTRAST TECHNIQUE: Multidetector CT imaging of the head, cervical spine, and maxillofacial structures were performed using the standard protocol without intravenous contrast. Multiplanar CT image reconstructions of the cervical spine and maxillofacial structures were also generated. COMPARISON:  None. FINDINGS: CT HEAD FINDINGS Brain: No acute territorial infarction, hemorrhage, or intracranial mass. Mild atrophy. Normal ventricle size. Vascular: No hyperdense vessel.  No unexpected calcification Skull: No depressed skull fracture Other: None CT MAXILLOFACIAL FINDINGS Osseous: No acute nasal bone fracture. Bilateral mandibular heads are normally position. No mandibular fracture. Mastoid air cells are clear. Pterygoid plates and zygomatic arches are intact. Prominent root lucency right maxillary central incisor. Orbits: Negative. No traumatic or inflammatory finding. Sinuses: Clear. Soft tissues: Negative. CT CERVICAL SPINE FINDINGS Alignment: Straightening of the cervical spine. No subluxation. Facet alignment maintained Skull base and vertebrae: No acute fracture. No primary bone lesion or focal pathologic process. Soft tissues and spinal canal: No prevertebral fluid or swelling. No visible canal hematoma. Disc levels:  Mild diffuse degenerative changes C4 through C7. Upper chest: Negative. Other: None IMPRESSION: 1. No CT evidence for acute intracranial abnormality.  Mild atrophy 2. No  acute facial bone fracture 3. Straightening of the cervical spine without acute osseous abnormality. Electronically Signed   By: Jasmine Pang M.D.   On: 08/10/2018 16:48   Ct Cervical Spine Wo Contrast  Result Date: 08/10/2018 CLINICAL DATA:  Larey Seat and hit head with left-sided swelling EXAM: CT HEAD WITHOUT CONTRAST CT MAXILLOFACIAL WITHOUT CONTRAST CT CERVICAL SPINE WITHOUT CONTRAST TECHNIQUE: Multidetector CT imaging of the head, cervical spine, and maxillofacial structures were performed using the standard protocol without intravenous contrast. Multiplanar CT image reconstructions of the cervical spine and maxillofacial structures were also generated. COMPARISON:  None. FINDINGS: CT HEAD FINDINGS Brain: No acute territorial infarction, hemorrhage, or intracranial mass. Mild atrophy. Normal ventricle size. Vascular: No hyperdense vessel.  No unexpected calcification Skull: No depressed skull fracture Other: None CT MAXILLOFACIAL FINDINGS Osseous: No acute nasal bone fracture. Bilateral mandibular heads are normally position. No mandibular fracture. Mastoid air cells are clear. Pterygoid plates and zygomatic arches are intact. Prominent root lucency right maxillary central incisor. Orbits: Negative. No traumatic or inflammatory finding. Sinuses: Clear. Soft tissues: Negative. CT CERVICAL SPINE FINDINGS Alignment: Straightening of the cervical spine. No subluxation. Facet alignment maintained Skull base and vertebrae: No acute fracture. No primary bone lesion or focal pathologic process. Soft tissues and spinal canal: No prevertebral fluid or swelling. No visible canal hematoma. Disc levels:  Mild diffuse degenerative changes C4 through C7. Upper chest: Negative. Other: None IMPRESSION: 1.  No CT evidence for acute intracranial abnormality.  Mild atrophy 2. No acute facial bone fracture 3. Straightening of the cervical spine without acute osseous abnormality. Electronically Signed   By: Jasmine Pang M.D.   On:  08/10/2018 16:48   Dg Knee Complete 4 Views Left  Result Date: 08/10/2018 CLINICAL DATA:  Fall at doctor's office with head injury. Knee pain. EXAM: LEFT KNEE - COMPLETE 4+ VIEW COMPARISON:  Radiographs 09/05/2017 and 10/08/2015. FINDINGS: The mineralization and alignment are normal. There is no evidence of acute fracture or dislocation. Age advanced tricompartmental osteoarthritis is again noted with severe joint space narrowing and osteophyte formation in the medial and patellofemoral compartments. No significant joint effusion. IMPRESSION: No acute osseous findings. Advanced tricompartmental osteoarthritis. Electronically Signed   By: Carey Bullocks M.D.   On: 08/10/2018 17:16   Dg C-arm 1-60 Min-no Report  Result Date: 08/10/2018 Fluoroscopy was utilized by the requesting physician.  No radiographic interpretation.   Ct Maxillofacial Wo Contrast  Result Date: 08/10/2018 CLINICAL DATA:  Larey Seat and hit head with left-sided swelling EXAM: CT HEAD WITHOUT CONTRAST CT MAXILLOFACIAL WITHOUT CONTRAST CT CERVICAL SPINE WITHOUT CONTRAST TECHNIQUE: Multidetector CT imaging of the head, cervical spine, and maxillofacial structures were performed using the standard protocol without intravenous contrast. Multiplanar CT image reconstructions of the cervical spine and maxillofacial structures were also generated. COMPARISON:  None. FINDINGS: CT HEAD FINDINGS Brain: No acute territorial infarction, hemorrhage, or intracranial mass. Mild atrophy. Normal ventricle size. Vascular: No hyperdense vessel.  No unexpected calcification Skull: No depressed skull fracture Other: None CT MAXILLOFACIAL FINDINGS Osseous: No acute nasal bone fracture. Bilateral mandibular heads are normally position. No mandibular fracture. Mastoid air cells are clear. Pterygoid plates and zygomatic arches are intact. Prominent root lucency right maxillary central incisor. Orbits: Negative. No traumatic or inflammatory finding. Sinuses: Clear.  Soft tissues: Negative. CT CERVICAL SPINE FINDINGS Alignment: Straightening of the cervical spine. No subluxation. Facet alignment maintained Skull base and vertebrae: No acute fracture. No primary bone lesion or focal pathologic process. Soft tissues and spinal canal: No prevertebral fluid or swelling. No visible canal hematoma. Disc levels:  Mild diffuse degenerative changes C4 through C7. Upper chest: Negative. Other: None IMPRESSION: 1. No CT evidence for acute intracranial abnormality.  Mild atrophy 2. No acute facial bone fracture 3. Straightening of the cervical spine without acute osseous abnormality. Electronically Signed   By: Jasmine Pang M.D.   On: 08/10/2018 16:48      Cystoscopy with stent placement (08/10/2018)   Subjective: Feels much better today. No concerns.  Discharge Exam: Vitals:   08/12/18 0559 08/12/18 1231  BP: 109/73 107/64  Pulse: 92 91  Resp: 12 12  Temp: 98.2 F (36.8 C) 99 F (37.2 C)  SpO2: 99% 100%   Vitals:   08/11/18 2034 08/11/18 2235 08/12/18 0559 08/12/18 1231  BP: (!) 84/56 (!) 101/54 109/73 107/64  Pulse: 96 91 92 91  Resp: Temp: 98.6 F (37 C)  98.2 F (36.8 C) 99 F (37.2 C)  TempSrc: Oral  Oral Oral  SpO2: 100%  99% 100%  Weight:      Height:        General: Pt is alert, awake, not in acute distress Cardiovascular: RRR, S1/S2 +, no rubs, no gallops Respiratory: CTA bilaterally, no wheezing, no rhonchi Abdominal: Soft, NT, ND, bowel sounds + Extremities: no edema, no cyanosis    The results of significant diagnostics from this hospitalization (including imaging, microbiology, ancillary  and laboratory) are listed below for reference.     Microbiology: Recent Results (from the past 240 hour(s))  Urine culture     Status: Abnormal   Collection Time: 08/04/18  5:46 PM  Result Value Ref Range Status   Specimen Description   Final    URINE, RANDOM Performed at [facility], [address] [place], [place] [address]    Special Requests   Final    NONE Performed at [facility], [address] [place], [place] [address]    Culture >=100,000 COLONIES/mL ESCHERICHIA COLI (A)  Final   Report Status 08/07/2018 FINAL  Final   Organism ID, Bacteria ESCHERICHIA COLI (A)  Final      Susceptibility   Escherichia coli - MIC*    AMPICILLIN >=32 RESISTANT Resistant     CEFAZOLIN 16 SENSITIVE Sensitive     CEFTRIAXONE <=1 SENSITIVE Sensitive     CIPROFLOXACIN <=0.25 SENSITIVE Sensitive     GENTAMICIN <=1 SENSITIVE Sensitive     IMIPENEM <=0.25 SENSITIVE Sensitive     NITROFURANTOIN <=16 SENSITIVE Sensitive     TRIMETH/SULFA <=20 SENSITIVE Sensitive     AMPICILLIN/SULBACTAM >=32 RESISTANT Resistant     PIP/TAZO <=4 SENSITIVE Sensitive     Extended ESBL NEGATIVE Sensitive     * >=100,000 COLONIES/mL ESCHERICHIA COLI  Blood culture (routine x 2)     Status: None (Preliminary result)   Collection Time: 08/10/18  3:36 PM  Result Value Ref Range Status   Specimen Description   Final    BLOOD RIGHT ANTECUBITAL Performed at [facility], [address], [place], [place] [address]    Special Requests   Final    BOTTLES DRAWN AEROBIC AND ANAEROBIC Blood Culture results may not be optimal due to an inadequate volume of blood received in culture bottles Performed at [facility], [address], [place], [place] [address]    Culture   Final    NO GROWTH 2 DAYS Performed at [facility], [address], [place], [place] [address]    Report Status PENDING  Incomplete  Blood culture (routine x 2)     Status: None (Preliminary result)   Collection Time: 08/10/18  3:41 PM  Result Value Ref Range Status   Specimen Description   Final    BLOOD LEFT ANTECUBITAL Performed at [facility], [address] [place], [place] [address]    Special Requests   Final    BOTTLES DRAWN AEROBIC AND ANAEROBIC Blood Culture adequate  volume Performed at [facility], [address] [place], [place] [address]    Culture   Final    NO GROWTH 2 DAYS Performed at [facility], [address] [place], [place] [address]    Report Status PENDING  Incomplete  Urine Culture     Status: None   Collection Time: 08/10/18  8:24 PM  Result Value Ref Range Status   Specimen Description   Final    CYSTOSCOPY Performed at [facility], [address] [place], [place] [address]    Special Requests   Final    NONE Performed at [facility], [address], [place], [place] [address]    Culture   Final    NO GROWTH Performed at [facility], [address] [place], [place] [address]    Report Status 08/12/2018 FINAL  Final  Labs: BNP (last 3 results) No results for input(s): BNP in the last 8760 hours. Basic Metabolic Panel: Recent Labs  Lab 08/10/18 1546 08/11/18 0508 08/12/18 1113  NA 132* 135 137  K 4.8 5.2* 4.0  CL 102 107 110  CO2 21* 20* 21*  GLUCOSE 214* 262* 137*  BUN 23* 26* 17  CREATININE 2.43* 2.03* 1.24*  CALCIUM 11.2* 10.5* 10.1   Liver Function Tests: Recent Labs  Lab 08/10/18 1546  AST 23  ALT 20  ALKPHOS 77  BILITOT 0.6  PROT 7.7  ALBUMIN 3.2*   Recent Labs  Lab 08/10/18 1546  LIPASE 26   No results for input(s): AMMONIA in the last 168 hours. CBC: Recent Labs  Lab 08/10/18 1546 08/11/18 0508 08/12/18 1113  WBC 11.1* 9.4 6.7  NEUTROABS 8.3*  --   --   HGB 8.7* 7.7* 7.5*  HCT 30.8* 26.9* 25.7*  MCV 77.2* 76.9* 76.5*  PLT 293 239 298   Cardiac Enzymes: No results for input(s): CKTOTAL, CKMB, CKMBINDEX, TROPONINI in the last 168 hours. BNP: Invalid input(s): POCBNP CBG: Recent Labs  Lab 08/11/18 1155 08/11/18 1636 08/11/18 2036 08/12/18 0816 08/12/18 1247  GLUCAP 119* 183* 109* 76 89   D-Dimer No results for input(s): DDIMER in the last 72 hours. Hgb A1c Recent Labs    08/11/18 0508   HGBA1C 7.9*   Lipid Profile No results for input(s): CHOL, HDL, LDLCALC, TRIG, CHOLHDL, LDLDIRECT in the last 72 hours. Thyroid function studies No results for input(s): TSH, T4TOTAL, T3FREE, THYROIDAB in the last 72 hours.  Invalid input(s): FREET3 Anemia work up Recent Labs    08/11/18 0508  FERRITIN 157  TIBC 232*  IRON 25*   Urinalysis    Component Value Date/Time   COLORURINE YELLOW 08/10/2018 2024   APPEARANCEUR CLOUDY (A) 08/10/2018 2024   LABSPEC 1.014 08/10/2018 2024   PHURINE 5.0 08/10/2018 2024   GLUCOSEU NEGATIVE 08/10/2018 2024   HGBUR MODERATE (A) 08/10/2018 2024   BILIRUBINUR NEGATIVE 08/10/2018 2024   KETONESUR NEGATIVE 08/10/2018 2024   PROTEINUR NEGATIVE 08/10/2018 2024   UROBILINOGEN 0.2 02/12/2015 1348   NITRITE NEGATIVE 08/10/2018 2024   LEUKOCYTESUR LARGE (A) 08/10/2018 2024   Sepsis Labs Invalid input(s): PROCALCITONIN,  WBC,  LACTICIDVEN Microbiology Recent Results (from the past 240 hour(s))  Urine culture     Status: Abnormal   Collection Time: 08/04/18  5:46 PM  Result Value Ref Range Status   Specimen Description   Final    URINE, RANDOM Performed at [facility], [address] [place], [place] [address]    Special Requests   Final    NONE Performed at [facility] [address] [place], [place] [address]    Culture >=100,000 COLONIES/mL ESCHERICHIA COLI (A)  Final   Report Status 08/07/2018 FINAL  Final   Organism ID, Bacteria ESCHERICHIA COLI (A)  Final      Susceptibility   Escherichia coli - MIC*    AMPICILLIN >=32 RESISTANT Resistant     CEFAZOLIN 16 SENSITIVE Sensitive     CEFTRIAXONE <=1 SENSITIVE Sensitive     CIPROFLOXACIN <=0.25 SENSITIVE Sensitive     GENTAMICIN <=1 SENSITIVE Sensitive     IMIPENEM <=0.25 SENSITIVE Sensitive     NITROFURANTOIN <=16 SENSITIVE Sensitive     TRIMETH/SULFA <=20 SENSITIVE Sensitive     AMPICILLIN/SULBACTAM >=32 RESISTANT Resistant     PIP/TAZO <=4 SENSITIVE  Sensitive     Extended ESBL NEGATIVE Sensitive     * >=  100,000 COLONIES/mL ESCHERICHIA COLI  Blood culture (routine x 2)     Status: None (Preliminary result)   Collection Time: 08/10/18  3:36 PM  Result Value Ref Range Status   Specimen Description   Final    BLOOD RIGHT ANTECUBITAL Performed at [facility], [address] [place], [place] [address]    Special Requests   Final    BOTTLES DRAWN AEROBIC AND ANAEROBIC Blood Culture results may not be optimal due to an inadequate volume of blood received in culture bottles Performed at [facility], [address], [place], [place] [address]    Culture   Final    NO GROWTH 2 DAYS Performed at [facility], [address], [place], [place] [address]    Report Status PENDING  Incomplete  Blood culture (routine x 2)     Status: None (Preliminary result)   Collection Time: 08/10/18  3:41 PM  Result Value Ref Range Status   Specimen Description   Final    BLOOD LEFT ANTECUBITAL Performed at [facility], [address], [place], [place] [address]    Special Requests   Final    BOTTLES DRAWN AEROBIC AND ANAEROBIC Blood Culture adequate volume Performed at [facility], [address] [place], [place] [address]    Culture   Final    NO GROWTH 2 DAYS Performed at [facility], [address] [place], [place] [address]    Report Status PENDING  Incomplete  Urine Culture     Status: None   Collection Time: 08/10/18  8:24 PM  Result Value Ref Range Status   Specimen Description   Final    CYSTOSCOPY Performed at [facility], [address] [place], [place] [address]    Special Requests   Final    NONE Performed at [facility], [address] [place], [place] [address]    Culture   Final    NO GROWTH Performed at [facility], [address], [place], [place] [address]    Report Status  08/12/2018 FINAL  Final    SIGNED:   Jacquelin Hawking, MD [facility] 08/12/2018, 1:24 PM

## 2018-08-13 ENCOUNTER — Encounter (HOSPITAL_COMMUNITY): Payer: Self-pay | Admitting: Urology

## 2018-08-13 ENCOUNTER — Other Ambulatory Visit: Payer: Self-pay

## 2018-08-13 NOTE — Progress Notes (Signed)
AT time of preop phone call patient denies cough, fever, runny nose, sore throat, difficulty breathing /shortness of breath and [place] family lmembers have traveled out of state in the last 14 days per patient.

## 2018-08-14 MED ORDER — DEXTROSE 5 % IV SOLN
3.0000 g | INTRAVENOUS | Status: AC
  Administered 2018-08-15: 3 g via INTRAVENOUS
  Filled 2018-08-14: qty 3

## 2018-08-14 NOTE — Progress Notes (Signed)
Anesthesia Chart Review   Case:  [id number] Date/Time:  08/15/18 0830   Procedure:  CYSTOSCOPY WITH RIGHT RETROGRADE PYELOGRAM, RIGHT URETEROSCOPY WITH HOLMIUM LASER AND STENT PLACEMENT (Right )   Anesthesia type:  General   Pre-op diagnosis:  right ureteral stone   Location:  [facility] ROOM 03 / [facility]   Surgeon:  Crista Elliot, MD      DISCUSSION:53 yo never smoker with h/o HTN, depression, anxiety, schizophrenia, OSA (scheduled for CPAP titration after surgery), GERD, asthma, DM II, right ureteral stone scheduled for above procedure 08/15/18 with Dr. Modena Slater.   Pt admitted 3/20-3/22/20 due to sepsis due to ureteral calculus, AKI (improved at discharge), acute on chronic microcytic anemia.    Hemoglobin 7.5 08/12/18.  Will recheck DOS.  Dr. Alvester Morin made aware.  VS: LMP 03/23/2014 Comment: patient states her periods are irregular  PROVIDERS: Mikael Spray, NP is PCP    LABS: Labs DOS, same day workup (all labs ordered are listed, but only abnormal results are displayed)  Labs Reviewed - No data to display   IMAGES: CXR 08/10/18 IMPRESSION: Patchy right basilar opacity likely represents atelectasis given the lesser degree of inspiration. No acute findings.  EKG: 08/10/18 Rate 97 bpm Sinus rhythm  Consider left atrial enlargement   CV:  Past Medical History:  Diagnosis Date  . Anxiety   . Asthma   . Depression   . Diabetes mellitus without complication (HCC)   . Frequency of urination   . GERD (gastroesophageal reflux disease)   . History of asthma    last episode yrs ago  . Hypertension   . OSA (obstructive sleep apnea)    pt had study done oct 2014--  schedule for cpap titrate after kidney stone surgery  . Schizophrenia (HCC)   . Ureteral calculi    BILATERAL  . Wears glasses     Past Surgical History:  Procedure Laterality Date  . CESAREAN SECTION  1991  &  2002   w/ bilateral tubal ligation in 2002  . CYSTOSCOPY W/ URETERAL STENT PLACEMENT Right 08/10/2018    Procedure: CYSTOSCOPY WITH RETROGRADE PYELOGRAM/URETERAL STENT PLACEMENT;  Surgeon: Jerilee Field, MD;  Location: [facility];  Service: Urology;  Laterality: Right;  . CYSTOSCOPY WITH RETROGRADE PYELOGRAM, URETEROSCOPY AND STENT PLACEMENT Bilateral 05/02/2013   Procedure: CYSTOSCOPY WITH RETROGRADE PYELOGRAM, URETEROSCOPY ;  Surgeon: Valetta Fuller, MD;  Location: [facility];  Service: Urology;  Laterality: Bilateral;  . CYSTOSCOPY WITH STENT PLACEMENT Left 05/02/2013   Procedure: CYSTOSCOPY WITH STENT PLACEMENT;  Surgeon: Valetta Fuller, MD;  Location: [facility];  Service: Urology;  Laterality: Left;  . HOLMIUM LASER APPLICATION Bilateral 05/02/2013   Procedure: HOLMIUM LASER APPLICATION;  Surgeon: Valetta Fuller, MD;  Location: [facility];  Service: Urology;  Laterality: Bilateral;  . LAPAROSCOPIC APPENDECTOMY N/A 05/22/2014   Procedure: APPENDECTOMY LAPAROSCOPIC;  Surgeon: Glenna Fellows, MD;  Location: [facility];  Service: General;  Laterality: N/A;  . TONSILLECTOMY  age 53    MEDICATIONS: . [START ON 08/15/2018] ceFAZolin (ANCEF) 3 g in dextrose 5 % 50 mL IVPB   . albuterol (VENTOLIN HFA) 108 (90 Base) MCG/ACT inhaler  . amitriptyline (ELAVIL) 50 MG tablet  . cefdinir (OMNICEF) 300 MG capsule  . clonazePAM (KLONOPIN) 1 MG tablet  . diphenhydrAMINE HCl, Sleep, (ZZZQUIL) 25 MG CAPS  . ondansetron (ZOFRAN ODT) 4 MG disintegrating tablet  . oxyCODONE (OXYCONTIN) 10 mg 12 hr tablet  . sertraline (ZOLOFT) 100  MG tablet   Janey Genta [facility] Pre-Surgical Testing [phone] 08/14/18 3:13 PM

## 2018-08-15 ENCOUNTER — Encounter (HOSPITAL_COMMUNITY): Payer: Self-pay | Admitting: *Deleted

## 2018-08-15 ENCOUNTER — Ambulatory Visit (HOSPITAL_COMMUNITY)
Admission: RE | Admit: 2018-08-15 | Discharge: 2018-08-15 | Disposition: A | Discharge status: Home / Self Care | Payer: 59 | Attending: Urology | Admitting: Urology

## 2018-08-15 ENCOUNTER — Ambulatory Visit (HOSPITAL_COMMUNITY): Payer: 59 | Admitting: Physician Assistant

## 2018-08-15 ENCOUNTER — Other Ambulatory Visit: Payer: Self-pay

## 2018-08-15 ENCOUNTER — Ambulatory Visit (HOSPITAL_COMMUNITY): Payer: 59

## 2018-08-15 ENCOUNTER — Encounter (HOSPITAL_COMMUNITY)
Admission: RE | Disposition: A | Discharge status: Home / Self Care | Payer: Self-pay | Source: Home / Self Care | Attending: Urology

## 2018-08-15 DIAGNOSIS — Z79899 Other long term (current) drug therapy: Secondary | ICD-10-CM | POA: Diagnosis not present

## 2018-08-15 DIAGNOSIS — G4733 Obstructive sleep apnea (adult) (pediatric): Secondary | ICD-10-CM | POA: Diagnosis not present

## 2018-08-15 DIAGNOSIS — J45909 Unspecified asthma, uncomplicated: Secondary | ICD-10-CM | POA: Insufficient documentation

## 2018-08-15 DIAGNOSIS — E119 Type 2 diabetes mellitus without complications: Secondary | ICD-10-CM | POA: Diagnosis not present

## 2018-08-15 DIAGNOSIS — F209 Schizophrenia, unspecified: Secondary | ICD-10-CM | POA: Insufficient documentation

## 2018-08-15 DIAGNOSIS — I10 Essential (primary) hypertension: Secondary | ICD-10-CM | POA: Insufficient documentation

## 2018-08-15 DIAGNOSIS — F419 Anxiety disorder, unspecified: Secondary | ICD-10-CM | POA: Diagnosis not present

## 2018-08-15 DIAGNOSIS — Z87442 Personal history of urinary calculi: Secondary | ICD-10-CM | POA: Insufficient documentation

## 2018-08-15 DIAGNOSIS — N132 Hydronephrosis with renal and ureteral calculous obstruction: Secondary | ICD-10-CM | POA: Insufficient documentation

## 2018-08-15 DIAGNOSIS — F329 Major depressive disorder, single episode, unspecified: Secondary | ICD-10-CM | POA: Diagnosis not present

## 2018-08-15 HISTORY — PX: CYSTOSCOPY WITH RETROGRADE PYELOGRAM, URETEROSCOPY AND STENT PLACEMENT: SHX5789

## 2018-08-15 LAB — CBC
HCT: 30.7 % — ABNORMAL LOW (ref 36.0–46.0)
Hemoglobin: 9 g/dL — ABNORMAL LOW (ref 12.0–15.0)
MCH: 22.6 pg — ABNORMAL LOW (ref 26.0–34.0)
MCHC: 29.3 g/dL — ABNORMAL LOW (ref 30.0–36.0)
MCV: 76.9 fL — ABNORMAL LOW (ref 80.0–100.0)
Platelets: 393 10*3/uL (ref 150–400)
RBC: 3.99 MIL/uL (ref 3.87–5.11)
RDW: 16 % — ABNORMAL HIGH (ref 11.5–15.5)
WBC: 7.3 10*3/uL (ref 4.0–10.5)
nRBC: 0 % (ref 0.0–0.2)

## 2018-08-15 LAB — HEMOGLOBIN A1C
Hgb A1c MFr Bld: 7.9 % — ABNORMAL HIGH (ref 4.8–5.6)
Mean Plasma Glucose: 180.03 mg/dL

## 2018-08-15 LAB — CULTURE, BLOOD (ROUTINE X 2)
Culture: NO GROWTH
Culture: NO GROWTH
Special Requests: ADEQUATE

## 2018-08-15 LAB — BASIC METABOLIC PANEL
Anion gap: 9 (ref 5–15)
BUN: 11 mg/dL (ref 6–20)
CHLORIDE: 107 mmol/L (ref 98–111)
CO2: 21 mmol/L — ABNORMAL LOW (ref 22–32)
Calcium: 10.5 mg/dL — ABNORMAL HIGH (ref 8.9–10.3)
Creatinine, Ser: 1.09 mg/dL — ABNORMAL HIGH (ref 0.44–1.00)
GFR calc Af Amer: >60 mL/min (ref >60)
GFR calc non Af Amer: 59 mL/min — ABNORMAL LOW (ref >60)
Glucose, Bld: 167 mg/dL — ABNORMAL HIGH (ref 70–99)
Potassium: 4.2 mmol/L (ref 3.5–5.1)
Sodium: 137 mmol/L (ref 135–145)

## 2018-08-15 LAB — GLUCOSE, CAPILLARY
Glucose-Capillary: 162 mg/dL — ABNORMAL HIGH (ref 70–99)
Glucose-Capillary: 128 mg/dL — ABNORMAL HIGH (ref 70–99)

## 2018-08-15 SURGERY — CYSTOURETEROSCOPY, WITH RETROGRADE PYELOGRAM AND STENT INSERTION
Anesthesia: General | Laterality: Right

## 2018-08-15 MED ORDER — LACTATED RINGERS IV SOLN: INTRAVENOUS | Status: DC

## 2018-08-15 MED ORDER — ACETAMINOPHEN 10 MG/ML IV SOLN: 1000.0000 mg | Freq: Once | INTRAVENOUS | Status: DC | PRN

## 2018-08-15 MED ORDER — PROPOFOL 10 MG/ML IV BOLUS
INTRAVENOUS | Status: AC
  Filled 2018-08-15: qty 20

## 2018-08-15 MED ORDER — MIDAZOLAM HCL 2 MG/2ML IJ SOLN
INTRAMUSCULAR | Status: AC
  Filled 2018-08-15: qty 2

## 2018-08-15 MED ORDER — DEXAMETHASONE SODIUM PHOSPHATE 10 MG/ML IJ SOLN
INTRAMUSCULAR | Status: AC
  Filled 2018-08-15: qty 1

## 2018-08-15 MED ORDER — SODIUM CHLORIDE 0.9 % IR SOLN
Status: DC | PRN
  Administered 2018-08-15: 3000 mL

## 2018-08-15 MED ORDER — MEPERIDINE HCL 50 MG/ML IJ SOLN: 6.2500 mg | INTRAMUSCULAR | Status: DC | PRN

## 2018-08-15 MED ORDER — ACETAMINOPHEN 325 MG PO TABS: 325.0000 mg | ORAL_TABLET | Freq: Once | ORAL | Status: DC

## 2018-08-15 MED ORDER — LIDOCAINE 2% (20 MG/ML) 5 ML SYRINGE
INTRAMUSCULAR | Status: AC
  Filled 2018-08-15: qty 5

## 2018-08-15 MED ORDER — ACETAMINOPHEN 160 MG/5ML PO SOLN: 325.0000 mg | Freq: Once | ORAL | Status: DC

## 2018-08-15 MED ORDER — FENTANYL CITRATE (PF) 100 MCG/2ML IJ SOLN
INTRAMUSCULAR | Status: AC
  Filled 2018-08-15: qty 2

## 2018-08-15 MED ORDER — MIDAZOLAM HCL 5 MG/5ML IJ SOLN
INTRAMUSCULAR | Status: DC | PRN
  Administered 2018-08-15 (×2): 1 mg via INTRAVENOUS

## 2018-08-15 MED ORDER — PROPOFOL 10 MG/ML IV BOLUS
INTRAVENOUS | Status: DC | PRN
  Administered 2018-08-15: 200 mg via INTRAVENOUS

## 2018-08-15 MED ORDER — LIDOCAINE 2% (20 MG/ML) 5 ML SYRINGE
INTRAMUSCULAR | Status: DC | PRN
  Administered 2018-08-15: 50 mg via INTRAVENOUS

## 2018-08-15 MED ORDER — DEXAMETHASONE SODIUM PHOSPHATE 10 MG/ML IJ SOLN
INTRAMUSCULAR | Status: DC | PRN
  Administered 2018-08-15: 10 mg via INTRAVENOUS

## 2018-08-15 MED ORDER — FENTANYL CITRATE (PF) 100 MCG/2ML IJ SOLN: 25.0000 ug | INTRAMUSCULAR | Status: DC | PRN

## 2018-08-15 MED ORDER — PROMETHAZINE HCL 25 MG/ML IJ SOLN: 6.2500 mg | INTRAMUSCULAR | Status: DC | PRN

## 2018-08-15 MED ORDER — FENTANYL CITRATE (PF) 100 MCG/2ML IJ SOLN
INTRAMUSCULAR | Status: DC | PRN
  Administered 2018-08-15 (×4): 25 ug via INTRAVENOUS

## 2018-08-15 MED ORDER — ONDANSETRON HCL 4 MG/2ML IJ SOLN
INTRAMUSCULAR | Status: DC | PRN
  Administered 2018-08-15: 4 mg via INTRAVENOUS

## 2018-08-15 MED ORDER — LACTATED RINGERS IV SOLN
INTRAVENOUS | Status: DC
  Administered 2018-08-15: 07:00:00 via INTRAVENOUS

## 2018-08-15 MED ORDER — IOHEXOL 300 MG/ML  SOLN
INTRAMUSCULAR | Status: DC | PRN
  Administered 2018-08-15: 8 mL

## 2018-08-15 MED ORDER — ONDANSETRON HCL 4 MG/2ML IJ SOLN
INTRAMUSCULAR | Status: AC
  Filled 2018-08-15: qty 2

## 2018-08-15 SURGICAL SUPPLY — 23 items
BAG URO CATCHER STRL LF (MISCELLANEOUS) ×3 IMPLANT
BASKET LASER NITINOL 1.9FR (BASKET) IMPLANT
BASKET ZERO TIP NITINOL 2.4FR (BASKET) IMPLANT
CATH INTERMIT  6FR 70CM (CATHETERS) IMPLANT
CATH URET 5FR 28IN CONE TIP (BALLOONS)
CATH URET 5FR 70CM CONE TIP (BALLOONS) IMPLANT
CLOTH BEACON ORANGE TIMEOUT ST (SAFETY) ×3 IMPLANT
COVER WAND RF STERILE (DRAPES) IMPLANT
EXTRACTOR STONE 1.7FRX115CM (UROLOGICAL SUPPLIES) ×3 IMPLANT
FIBER LASER FLEXIVA 365 (UROLOGICAL SUPPLIES) IMPLANT
FIBER LASER TRAC TIP (UROLOGICAL SUPPLIES) ×3 IMPLANT
GLOVE BIO SURGEON STRL SZ7.5 (GLOVE) ×3 IMPLANT
GOWN STRL REUS W/TWL XL LVL3 (GOWN DISPOSABLE) ×3 IMPLANT
GUIDEWIRE ANG ZIPWIRE 038X150 (WIRE) IMPLANT
GUIDEWIRE STR DUAL SENSOR (WIRE) ×3 IMPLANT
KIT TURNOVER KIT A (KITS) IMPLANT
MANIFOLD NEPTUNE II (INSTRUMENTS) ×3 IMPLANT
PACK CYSTO (CUSTOM PROCEDURE TRAY) ×3 IMPLANT
SHEATH URETERAL 12FRX28CM (UROLOGICAL SUPPLIES) IMPLANT
SHEATH URETERAL 12FRX35CM (MISCELLANEOUS) IMPLANT
STENT FRM UR WO WIRE 8FRX24CM (STENTS) ×3 IMPLANT
STENT URET 6FRX26 CONTOUR (STENTS) ×3 IMPLANT
TUBING UROLOGY SET (TUBING) ×3 IMPLANT

## 2018-08-15 NOTE — Anesthesia Procedure Notes (Addendum)
Procedure Name: LMA Insertion Date/Time: 08/15/2018 9:01 AM Performed by: Paris Lore, CRNA Pre-anesthesia Checklist: Patient identified, Emergency Drugs available, Suction available, Patient being monitored and Timeout performed Patient Re-evaluated:Patient Re-evaluated prior to induction Oxygen Delivery Method: Circle system utilized Preoxygenation: Pre-oxygenation with 100% oxygen Induction Type: IV induction Ventilation: Mask ventilation without difficulty LMA: LMA inserted LMA Size: 5.0 Number of attempts: 1 Placement Confirmation: positive ETCO2 and breath sounds checked- equal and bilateral Tube secured with: Tape

## 2018-08-15 NOTE — Anesthesia Preprocedure Evaluation (Addendum)
Anesthesia Evaluation  Patient identified by MRN, date of birth, ID band Patient awake    Reviewed: Allergy & Precautions, NPO status , Patient's Chart, lab work & pertinent test results  Airway Mallampati: II  TM Distance: >3 FB Neck ROM: Full    Dental  (+) Teeth Intact, Dental Advisory Given   Pulmonary asthma ,    breath sounds clear to auscultation       Cardiovascular hypertension,  Rhythm:Regular Rate:Normal     Neuro/Psych Anxiety Depression Schizophrenia negative neurological ROS     GI/Hepatic Neg liver ROS, GERD  ,  Endo/Other  diabetes  Renal/GU      Musculoskeletal negative musculoskeletal ROS (+)   Abdominal Normal abdominal exam  (+)   Peds  Hematology negative hematology ROS (+)   Anesthesia Other Findings   Reproductive/Obstetrics                            Lab Results  Component Value Date   WBC 7.3 08/15/2018   HGB 9.0 (L) 08/15/2018   HCT 30.7 (L) 08/15/2018   MCV 76.9 (L) 08/15/2018   PLT 393 08/15/2018   Lab Results  Component Value Date   CREATININE 1.09 (H) 08/15/2018   BUN 11 08/15/2018   NA 137 08/15/2018   K 4.2 08/15/2018   CL 107 08/15/2018   CO2 21 (L) 08/15/2018   No results found for: INR, PROTIME   Anesthesia Physical Anesthesia Plan  ASA: III  Anesthesia Plan: General   Post-op Pain Management:    Induction: Intravenous  PONV Risk Score and Plan: 4 or greater and Ondansetron, Dexamethasone, Midazolam and Scopolamine patch - Pre-op  Airway Management Planned: LMA  Additional Equipment: None  Intra-op Plan:   Post-operative Plan: Extubation in OR  Informed Consent: I have reviewed the patients History and Physical, chart, labs and discussed the procedure including the risks, benefits and alternatives for the proposed anesthesia with the patient or authorized representative who has indicated his/her understanding and acceptance.      Dental advisory given  Plan Discussed with: CRNA  Anesthesia Plan Comments:        Anesthesia Quick Evaluation

## 2018-08-15 NOTE — Discharge Instructions (Addendum)
[facility] °[phone] °Post Ureteroscopy With or Without Stent Instructions ° °Definitions: ° °Ureter: The duct that transports urine from the kidney to the bladder. °Stent:   A plastic hollow tube that is placed into the ureter, from the kidney to the                 bladder to prevent the ureter from swelling shut. ° °GENERAL INSTRUCTIONS: ° °Despite the fact that no skin incisions were used, the area around the ureter and bladder is raw and irritated. The stent is a foreign body which will further irritate the bladder wall. This irritation is manifested by increased frequency of urination, both day and night, and by an increase in the urge to urinate. In some, the urge to urinate is present almost always. Sometimes the urge is strong enough that you may not be able to stop yourself from urinating. The only real cure is to remove the stent and then give time for the bladder wall to heal which can't be done until the danger of the ureter swelling shut has passed, which varies. ° °You may see some blood in your urine while the stent is in place and a few days afterwards. Do not be alarmed, even if the urine was clear for a while. Get off your feet and drink lots of fluids until clearing occurs. If you start to pass clots or don't improve, call us. ° °DIET: °You may return to your normal diet immediately. Because of the raw surface of your bladder, alcohol, spicy foods, acid type foods and drinks with caffeine may cause irritation or frequency and should be used in moderation. To keep your urine flowing freely and to avoid constipation, drink plenty of fluids during the day ( 8-10 glasses ). °Tip: Avoid cranberry juice because it is very acidic. ° °ACTIVITY: °Your physical activity doesn't need to be restricted. However, if you are very active, you may see some blood in your urine. We suggest that you reduce your activity under these circumstances until the bleeding has stopped. ° °BOWELS: °It is  important to keep your bowels regular during the postoperative period. Straining with bowel movements can cause bleeding. A bowel movement every other day is reasonable. Use a mild laxative if needed, such as Milk of Magnesia 2-3 tablespoons, or 2 Dulcolax tablets. Call if you continue to have problems. If you have been taking narcotics for pain, before, during or after your surgery, you may be constipated. Take a laxative if necessary. ° ° °MEDICATION: °You should resume your pre-surgery medications unless told not to. °You may take oxybutynin or flomax if prescribed for bladder spasms or discomfort from the stent °Take pain medication as directed for pain refractory to conservative management ° °PROBLEMS YOU SHOULD REPORT TO US: °· Fevers over 100.5 Fahrenheit. °· Heavy bleeding, or clots ( See above notes about blood in urine ). °· Inability to urinate. °· Drug reactions ( hives, rash, nausea, vomiting, diarrhea ). °· Severe burning or pain with urination that is not improving. ° ° °General Anesthesia, Adult, Care After °This sheet gives you information about how to care for yourself after your procedure. Your health care provider may also give you more specific instructions. If you have problems or questions, contact your health care provider. °What can I expect after the procedure? °After the procedure, the following side effects are common: °· Pain or discomfort at the IV site. °· Nausea. °· Vomiting. °· Sore throat. °· Trouble concentrating. °· Feeling   cold or chills. °· Weak or tired. °· Sleepiness and fatigue. °· Soreness and body aches. These side effects can affect parts of the body that were not involved in surgery. °Follow these instructions at home: ° °For at least 24 hours after the procedure: °· Have a responsible adult stay with you. It is important to have someone help care for you until you are awake and alert. °· Rest as needed. °· Do not: °? Participate in activities in which you could fall or  become injured. °? Drive. °? Use heavy machinery. °? Drink alcohol. °? Take sleeping pills or medicines that cause drowsiness. °? Make important decisions or sign legal documents. °? Take care of children on your own. °Eating and drinking °· Follow any instructions from your health care provider about eating or drinking restrictions. °· When you feel hungry, start by eating small amounts of foods that are soft and easy to digest (bland), such as toast. Gradually return to your regular diet. °· Drink enough fluid to keep your urine pale yellow. °· If you vomit, rehydrate by drinking water, juice, or clear broth. °General instructions °· If you have sleep apnea, surgery and certain medicines can increase your risk for breathing problems. Follow instructions from your health care provider about wearing your sleep device: °? Anytime you are sleeping, including during daytime naps. °? While taking prescription pain medicines, sleeping medicines, or medicines that make you drowsy. °· Return to your normal activities as told by your health care provider. Ask your health care provider what activities are safe for you. °· Take over-the-counter and prescription medicines only as told by your health care provider. °· If you smoke, do not smoke without supervision. °· Keep all follow-up visits as told by your health care provider. This is important. °Contact a health care provider if: °· You have nausea or vomiting that does not get better with medicine. °· You cannot eat or drink without vomiting. °· You have pain that does not get better with medicine. °· You are unable to pass urine. °· You develop a skin rash. °· You have a fever. °· You have redness around your IV site that gets worse. °Get help right away if: °· You have difficulty breathing. °· You have chest pain. °· You have blood in your urine or stool, or you vomit blood. °Summary °· After the procedure, it is common to have a sore throat or nausea. It is also common  to feel tired. °· Have a responsible adult stay with you for the first 24 hours after general anesthesia. It is important to have someone help care for you until you are awake and alert. °· When you feel hungry, start by eating small amounts of foods that are soft and easy to digest (bland), such as toast. Gradually return to your regular diet. °· Drink enough fluid to keep your urine pale yellow. °· Return to your normal activities as told by your health care provider. Ask your health care provider what activities are safe for you. °This information is not intended to replace advice given to you by your health care provider. Make sure you discuss any questions you have with your health care provider. °Document Released: 08/15/2000 Document Revised: 12/23/2016 Document Reviewed: 12/23/2016 °Elsevier Interactive Patient Education © 2019 [employer] ° ° °

## 2018-08-15 NOTE — Transfer of Care (Signed)
Immediate Anesthesia Transfer of Care Note  Patient: Monique Holland  Procedure(s) Performed: Procedure(s): CYSTOSCOPY WITH RIGHT RETROGRADE PYELOGRAM, RIGHT URETEROSCOPY WITH HOLMIUM LASER AND STENT PLACEMENT (Right)  Patient Location: PACU  Anesthesia Type:General  Level of Consciousness:  sedated, patient cooperative and responds to stimulation  Airway & Oxygen Therapy:Patient Spontanous Breathing and Patient connected to face mask oxgen  Post-op Assessment:  Report given to PACU RN and Post -op Vital signs reviewed and stable  Post vital signs:  Reviewed and stable  Last Vitals:  Vitals:   08/15/18 0642  BP: 117/73  Pulse: (!) 119  Resp: 18  Temp: 37 C  SpO2: 100%    Complications: No apparent anesthesia complications

## 2018-08-15 NOTE — Op Note (Signed)
Operative Note  Preoperative diagnosis:  1.  Right ureteral calculus  Postoperative diagnosis: 1.  Right ureteral calculus  Procedure(s): 1.  Cystoscopy, right retrograde pyelogram, right ureteroscopy with laser lithotripsy basket extraction and ureteral stent exchange  Surgeon: Modena Slater, MD  Assistants: None  Anesthesia: General  Complications: None immediate  EBL: Minimal  Specimens: 1.  None  Drains/Catheters: 1.  6x26 double-J ureteral stent  Intraoperative findings: 1.  Normal urethra and bladder 2.  Right retrograde pyelogram revealed fullness of the renal pelvis.  No filling defect.  7 mm ureteral calculus fragmented and basket extracted  Indication: 53 year old female with a history of nephrolithiasis status post urgent ureteral stent placement last week by Dr. Mena Goes presents for definitive management of her stone.  Prior to the procedure, hemoglobin was checked and it was up to 9 from 7.5.   Description of procedure:  The patient was identified and consent was obtained.  The patient was taken to the operating room and placed in the supine position.  The patient was placed under general anesthesia.  Perioperative antibiotics were administered.  The patient was placed in dorsal lithotomy.  Patient was prepped and draped in a standard sterile fashion and a timeout was performed.  21 French rigid cystoscope was advanced into the urethra and into the bladder.  Complete cystoscopy was performed with no abnormal findings.  The stent was grasped and pulled just beyond the urethral meatus.  A sensor wire was advanced through the stent and up to the kidney under fluoroscopic guidance.  The stent was withdrawn.  A semirigid ureteroscope was advanced alongside the wire up the ureter to the stone of interest which was fragmented into smaller fragments.  I then basket extracted all the fragments.  I inspected the ureter up to the renal pelvis and no other ureteral calculi were  seen.  I shot a retrograde pyelogram through the scope with the findings noted above.  I then carefully withdrew the scope visualizing the ureter upon removal.  No other ureteral calculi were seen.  No significant trauma.  I backloaded the wire onto a rigid cystoscope which was advanced into the bladder.  I then placed a 6 x 26 double-J ureteral stent in a standard fashion followed by removal of the wire.  Fluoroscopy confirmed proximal placement and direct visualization confirmed a good coil within the bladder.  I drained the bladder and withdrew the scope.  Patient tolerated procedure well and was stable postoperatively.  Plan: Return in 1 week for stent removal

## 2018-08-15 NOTE — Interval H&P Note (Signed)
History and Physical Interval Note:  08/15/2018 6:56 AM  Monique Holland  has presented today for surgery, with the diagnosis of right ureteral stone.  The various methods of treatment have been discussed with the patient and family. After consideration of risks, benefits and other options for treatment, the patient has consented to  Procedure(s): CYSTOSCOPY WITH RIGHT RETROGRADE PYELOGRAM, RIGHT URETEROSCOPY WITH HOLMIUM LASER AND STENT PLACEMENT (Right) as a surgical intervention.  The patient's history has been reviewed, patient examined, no change in status, stable for surgery.  I have reviewed the patient's chart and labs.  Questions were answered to the patient's satisfaction.     Ray Church, III

## 2018-08-16 ENCOUNTER — Encounter (HOSPITAL_COMMUNITY): Payer: Self-pay | Admitting: Urology

## 2018-08-16 NOTE — Anesthesia Postprocedure Evaluation (Signed)
Anesthesia Post Note  Patient: Monique Holland  Procedure(s) Performed: CYSTOSCOPY WITH RIGHT RETROGRADE PYELOGRAM, RIGHT URETEROSCOPY WITH HOLMIUM LASER AND STENT PLACEMENT (Right )     Patient location during evaluation: PACU Anesthesia Type: General Level of consciousness: awake and alert Pain management: pain level controlled Vital Signs Assessment: post-procedure vital signs reviewed and stable Respiratory status: spontaneous breathing, nonlabored ventilation, respiratory function stable and patient connected to nasal cannula oxygen Cardiovascular status: blood pressure returned to baseline and stable Postop Assessment: no apparent nausea or vomiting Anesthetic complications: no    Last Vitals:  Vitals:   08/15/18 1035 08/15/18 1112  BP: (!) 142/86 124/73  Pulse: 94 96  Resp: 16 17  Temp: 36.6 C 36.6 C  SpO2: 98% 98%    Last Pain:  Vitals:   08/15/18 1112  TempSrc:   PainSc: 0-No pain   Pain Goal: Patients Stated Pain Goal: 3 (08/15/18 0720)                 Shelton Silvas

## 2018-09-01 ENCOUNTER — Other Ambulatory Visit: Payer: Self-pay

## 2018-09-01 ENCOUNTER — Inpatient Hospital Stay (HOSPITAL_COMMUNITY)
Admission: EM | Admit: 2018-09-01 | Discharge: 2018-09-04 | DRG: 812 | Disposition: A | Discharge status: Home Health | Payer: 59 | Attending: Internal Medicine | Admitting: Internal Medicine

## 2018-09-01 ENCOUNTER — Emergency Department (HOSPITAL_COMMUNITY): Payer: 59

## 2018-09-01 DIAGNOSIS — F209 Schizophrenia, unspecified: Secondary | ICD-10-CM | POA: Diagnosis present

## 2018-09-01 DIAGNOSIS — I10 Essential (primary) hypertension: Secondary | ICD-10-CM | POA: Diagnosis present

## 2018-09-01 DIAGNOSIS — Z87442 Personal history of urinary calculi: Secondary | ICD-10-CM | POA: Diagnosis not present

## 2018-09-01 DIAGNOSIS — E119 Type 2 diabetes mellitus without complications: Secondary | ICD-10-CM | POA: Diagnosis present

## 2018-09-01 DIAGNOSIS — R55 Syncope and collapse: Secondary | ICD-10-CM | POA: Diagnosis present

## 2018-09-01 DIAGNOSIS — N179 Acute kidney failure, unspecified: Secondary | ICD-10-CM | POA: Diagnosis present

## 2018-09-01 DIAGNOSIS — I952 Hypotension due to drugs: Secondary | ICD-10-CM | POA: Diagnosis not present

## 2018-09-01 DIAGNOSIS — E611 Iron deficiency: Secondary | ICD-10-CM | POA: Diagnosis not present

## 2018-09-01 DIAGNOSIS — F329 Major depressive disorder, single episode, unspecified: Secondary | ICD-10-CM | POA: Diagnosis present

## 2018-09-01 DIAGNOSIS — Y9301 Activity, walking, marching and hiking: Secondary | ICD-10-CM | POA: Diagnosis present

## 2018-09-01 DIAGNOSIS — Z79899 Other long term (current) drug therapy: Secondary | ICD-10-CM | POA: Diagnosis not present

## 2018-09-01 DIAGNOSIS — Y92512 Supermarket, store or market as the place of occurrence of the external cause: Secondary | ICD-10-CM

## 2018-09-01 DIAGNOSIS — G4733 Obstructive sleep apnea (adult) (pediatric): Secondary | ICD-10-CM | POA: Diagnosis present

## 2018-09-01 DIAGNOSIS — D638 Anemia in other chronic diseases classified elsewhere: Secondary | ICD-10-CM | POA: Diagnosis present

## 2018-09-01 DIAGNOSIS — Z6841 Body Mass Index (BMI) 40.0 and over, adult: Secondary | ICD-10-CM

## 2018-09-01 DIAGNOSIS — Z9851 Tubal ligation status: Secondary | ICD-10-CM | POA: Diagnosis not present

## 2018-09-01 DIAGNOSIS — E1169 Type 2 diabetes mellitus with other specified complication: Secondary | ICD-10-CM | POA: Diagnosis not present

## 2018-09-01 DIAGNOSIS — E876 Hypokalemia: Secondary | ICD-10-CM | POA: Diagnosis present

## 2018-09-01 DIAGNOSIS — D649 Anemia, unspecified: Secondary | ICD-10-CM | POA: Diagnosis present

## 2018-09-01 DIAGNOSIS — W1830XA Fall on same level, unspecified, initial encounter: Secondary | ICD-10-CM | POA: Diagnosis present

## 2018-09-01 DIAGNOSIS — F419 Anxiety disorder, unspecified: Secondary | ICD-10-CM | POA: Diagnosis present

## 2018-09-01 DIAGNOSIS — I951 Orthostatic hypotension: Secondary | ICD-10-CM | POA: Diagnosis present

## 2018-09-01 DIAGNOSIS — E66813 Obesity, class 3: Secondary | ICD-10-CM

## 2018-09-01 DIAGNOSIS — K219 Gastro-esophageal reflux disease without esophagitis: Secondary | ICD-10-CM | POA: Diagnosis present

## 2018-09-01 DIAGNOSIS — D509 Iron deficiency anemia, unspecified: Principal | ICD-10-CM | POA: Diagnosis present

## 2018-09-01 DIAGNOSIS — I959 Hypotension, unspecified: Secondary | ICD-10-CM | POA: Diagnosis present

## 2018-09-01 DIAGNOSIS — J45909 Unspecified asthma, uncomplicated: Secondary | ICD-10-CM | POA: Diagnosis present

## 2018-09-01 LAB — COMPREHENSIVE METABOLIC PANEL
ALT: 15 U/L (ref 0–44)
AST: 18 U/L (ref 15–41)
Albumin: 3.1 g/dL — ABNORMAL LOW (ref 3.5–5.0)
Alkaline Phosphatase: 46 U/L (ref 38–126)
Anion gap: 6 (ref 5–15)
BUN: 24 mg/dL — ABNORMAL HIGH (ref 6–20)
CO2: 21 mmol/L — ABNORMAL LOW (ref 22–32)
Calcium: 9.9 mg/dL (ref 8.9–10.3)
Chloride: 108 mmol/L (ref 98–111)
Creatinine, Ser: 1.25 mg/dL — ABNORMAL HIGH (ref 0.44–1.00)
GFR calc Af Amer: 58 mL/min — ABNORMAL LOW (ref >60)
GFR calc non Af Amer: 50 mL/min — ABNORMAL LOW (ref >60)
Glucose, Bld: 88 mg/dL (ref 70–99)
Potassium: 3.4 mmol/L — ABNORMAL LOW (ref 3.5–5.1)
Sodium: 135 mmol/L (ref 135–145)
Total Bilirubin: 0.6 mg/dL (ref 0.3–1.2)
Total Protein: 6.2 g/dL — ABNORMAL LOW (ref 6.5–8.1)

## 2018-09-01 LAB — CBC WITH DIFFERENTIAL/PLATELET
Abs Immature Granulocytes: 0.01 10*3/uL (ref 0.00–0.07)
Basophils Absolute: 0 10*3/uL (ref 0.0–0.1)
Basophils Relative: 1 %
Eosinophils Absolute: 0.2 10*3/uL (ref 0.0–0.5)
Eosinophils Relative: 3 %
HCT: 25 % — ABNORMAL LOW (ref 36.0–46.0)
Hemoglobin: 7.1 g/dL — ABNORMAL LOW (ref 12.0–15.0)
Immature Granulocytes: 0 %
Lymphocytes Relative: 41 %
Lymphs Abs: 2.3 10*3/uL (ref 0.7–4.0)
MCH: 22.8 pg — ABNORMAL LOW (ref 26.0–34.0)
MCHC: 28.4 g/dL — ABNORMAL LOW (ref 30.0–36.0)
MCV: 80.4 fL (ref 80.0–100.0)
Monocytes Absolute: 0.6 10*3/uL (ref 0.1–1.0)
Monocytes Relative: 10 %
Neutro Abs: 2.5 10*3/uL (ref 1.7–7.7)
Neutrophils Relative %: 45 %
Platelets: 184 10*3/uL (ref 150–400)
RBC: 3.11 MIL/uL — ABNORMAL LOW (ref 3.87–5.11)
RDW: 17.6 % — ABNORMAL HIGH (ref 11.5–15.5)
WBC: 5.6 10*3/uL (ref 4.0–10.5)
nRBC: 0 % (ref 0.0–0.2)

## 2018-09-01 LAB — URINALYSIS, ROUTINE W REFLEX MICROSCOPIC
Bilirubin Urine: NEGATIVE
Glucose, UA: NEGATIVE mg/dL
Hgb urine dipstick: NEGATIVE
Ketones, ur: NEGATIVE mg/dL
Nitrite: NEGATIVE
Protein, ur: NEGATIVE mg/dL
Specific Gravity, Urine: 1.008 (ref 1.005–1.030)
pH: 6 (ref 5.0–8.0)

## 2018-09-01 LAB — LACTATE DEHYDROGENASE: LDH: 104 U/L (ref 98–192)

## 2018-09-01 LAB — TROPONIN I: Troponin I: <0.03 ng/mL (ref <0.03)

## 2018-09-01 LAB — RETICULOCYTES
Immature Retic Fract: 30.7 % — ABNORMAL HIGH (ref 2.3–15.9)
RBC.: 3.1 MIL/uL — ABNORMAL LOW (ref 3.87–5.11)
Retic Count, Absolute: 72.5 K/uL (ref 19.0–186.0)
Retic Ct Pct: 2.3 % (ref 0.4–3.1)

## 2018-09-01 LAB — POC OCCULT BLOOD, ED: Fecal Occult Bld: NEGATIVE

## 2018-09-01 LAB — ABO/RH: ABO/RH(D): A POS

## 2018-09-01 LAB — PREPARE RBC (CROSSMATCH)

## 2018-09-01 IMAGING — DX PORTABLE CHEST - 1 VIEW
1 series · 1 of 1 positions shown · non-contrast
Comparison: [DATE]

CLINICAL DATA: Syncope. Chronic orthostatic hypotension. Dizziness
today.

EXAM:
PORTABLE CHEST 1 VIEW

[chest ap]
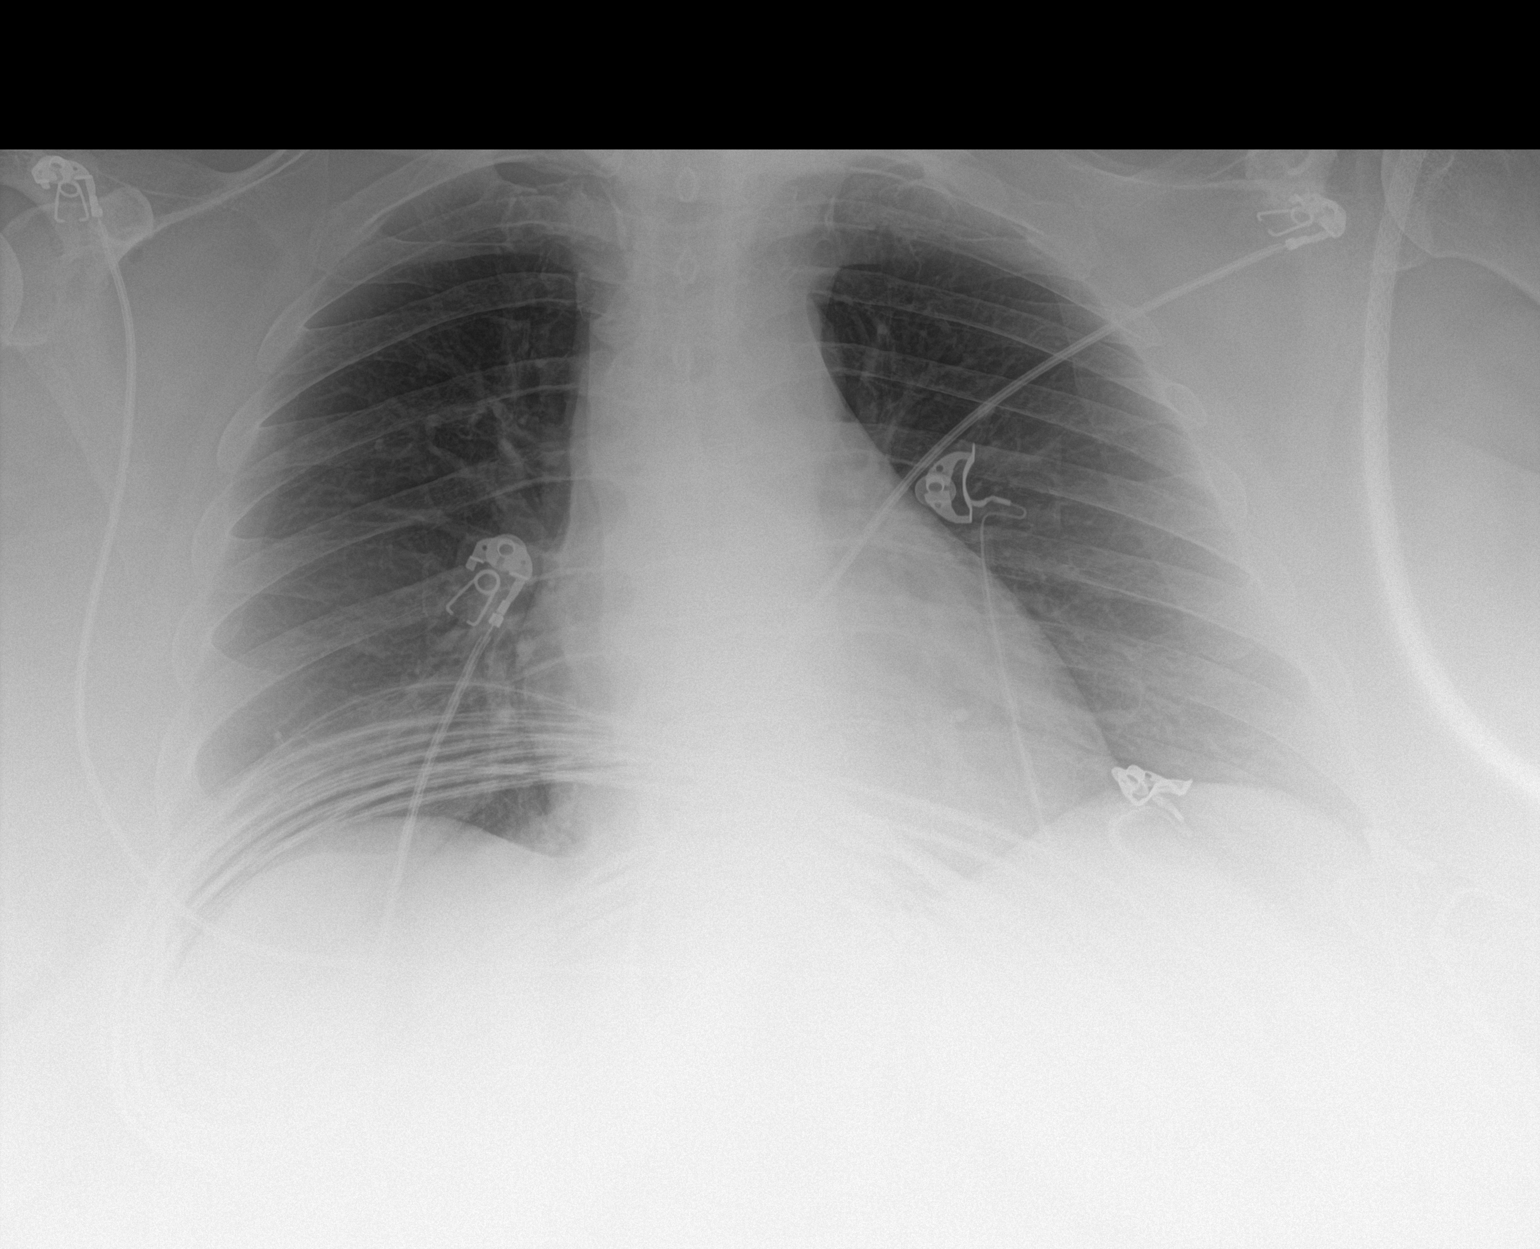

[1 of 1 positions shown; findings below may reference images not displayed]

FINDINGS: The cardiomediastinal silhouette is within normal limits for
portable AP technique. Multiple telemetry leads overlie the chest
and partially obscure the right lung base. No airspace
consolidation, edema, sizable pleural effusion, or pneumothorax is
identified. No acute osseous abnormality is seen.
IMPRESSION: No active disease.

## 2018-09-01 MED ORDER — SODIUM CHLORIDE 0.9 % IV SOLN
10.0000 mL/h | Freq: Once | INTRAVENOUS | Status: AC
  Administered 2018-09-01: 10 mL/h via INTRAVENOUS

## 2018-09-01 MED ORDER — SODIUM CHLORIDE 0.9 % IV SOLN
INTRAVENOUS | Status: DC
  Administered 2018-09-01 – 2018-09-02 (×2): via INTRAVENOUS

## 2018-09-01 MED ORDER — SODIUM CHLORIDE 0.9 % IV BOLUS
500.0000 mL | Freq: Once | INTRAVENOUS | Status: AC
  Administered 2018-09-01: 500 mL via INTRAVENOUS

## 2018-09-01 NOTE — ED Notes (Signed)
Bed: GY56 Expected date:  Expected time:  Means of arrival:  Comments: 53 yo F/ orthostatic hypotension-syncope

## 2018-09-01 NOTE — ED Notes (Signed)
Lab called at this time to run add on bloodwork

## 2018-09-01 NOTE — H&P (Signed)
History and Physical   Monique Holland ZOX:[id number] DOB: October 27, 1965 DOA: 09/01/2018  Referring MD/NP/PA: Dr. Alona Bene  PCP: Mikael Spray, NP   Outpatient Specialists: None  Patient coming from: Home  Chief Complaint: Dizziness with syncope  HPI: Monique Holland is a 53 y.o. female with medical history significant of schizophrenia, diabetes, hypertension, GERD, recent kidney stone with stent placement who was admitted in early March with fall and symptomatic anemia.  Patient at the time was found to have nephrolithiasis at which point she underwent cystoscopy with lithotripsy and basket extraction on March 25.  Since then she has been feeling dizzy.  Patient felt much dizzier today.  She actually passed out briefly.  Not observed.  Patient came to the ER where her hemoglobin was noted to have dropped from 10 g to 7 g since March.  She denied any melena no bright blood per rectum.  Denied any gross hematuria.  Also denied any hematemesis.  During last hospitalization patient was noted to be anemic and work-up showed no significant findings and suspected anemia of chronic disease.  Iron supplements recommended.  She was also hypotensive at the time and her antihypertensives were discontinued at discharge.  She had acute kidney injury with creatinine at discharge of 1.24.  Patient today is being admitted with symptomatic anemia.  She is guaiac negative..  ED Course: Her electrolytes showed potassium 3.4 creatinine 1.25 and BUN 24.  Albumin is 3.1.  White count 5.6 hemoglobin 7.1 and platelets 184.  Differentials are within normal.  Fecal occult blood testing was negative x2.  Chest x-ray showed no active disease.  Urinalysis showed WBC of 21-50 but no RBCs.  No evidence of infection.  Patient is being admitted therefore for treatment.  Review of Systems: As per HPI otherwise 10 point review of systems negative.    Past Medical History:  Diagnosis Date  . Anxiety   . Asthma   . Depression   .  Diabetes mellitus without complication (HCC)   . Frequency of urination   . GERD (gastroesophageal reflux disease)   . History of asthma    last episode yrs ago  . Hypertension   . OSA (obstructive sleep apnea)    pt had study done oct 2014--  schedule for cpap titrate after kidney stone surgery  . Schizophrenia (HCC)   . Ureteral calculi    BILATERAL  . Wears glasses     Past Surgical History:  Procedure Laterality Date  . CESAREAN SECTION  1991  &  2002   w/ bilateral tubal ligation in 2002  . CYSTOSCOPY W/ URETERAL STENT PLACEMENT Right 08/10/2018   Procedure: CYSTOSCOPY WITH RETROGRADE PYELOGRAM/URETERAL STENT PLACEMENT;  Surgeon: Jerilee Field, MD;  Location: [facility];  Service: Urology;  Laterality: Right;  . CYSTOSCOPY WITH RETROGRADE PYELOGRAM, URETEROSCOPY AND STENT PLACEMENT Bilateral 05/02/2013   Procedure: CYSTOSCOPY WITH RETROGRADE PYELOGRAM, URETEROSCOPY ;  Surgeon: Valetta Fuller, MD;  Location: [facility];  Service: Urology;  Laterality: Bilateral;  . CYSTOSCOPY WITH RETROGRADE PYELOGRAM, URETEROSCOPY AND STENT PLACEMENT Right 08/15/2018   Procedure: CYSTOSCOPY WITH RIGHT RETROGRADE PYELOGRAM, RIGHT URETEROSCOPY WITH HOLMIUM LASER AND STENT PLACEMENT;  Surgeon: Crista Elliot, MD;  Location: [facility];  Service: Urology;  Laterality: Right;  . CYSTOSCOPY WITH STENT PLACEMENT Left 05/02/2013   Procedure: CYSTOSCOPY WITH STENT PLACEMENT;  Surgeon: Valetta Fuller, MD;  Location: [facility];  Service: Urology;  Laterality: Left;  . HOLMIUM LASER APPLICATION Bilateral 05/02/2013  Procedure: HOLMIUM LASER APPLICATION;  Surgeon: Valetta Fulleravid S Grapey, MD;  [facility];  Service: Urology;  Laterality: Bilateral;  . LAPAROSCOPIC APPENDECTOMY N/A 05/22/2014   Procedure: APPENDECTOMY LAPAROSCOPIC;  Surgeon: Glenna FellowsBenjamin Hoxworth, MD;  Location: [facility];  Service: General;  Laterality: N/A;  . TONSILLECTOMY  age 53     reports that  she has never smoked. She has never used smokeless tobacco. She reports that she does not drink alcohol or use drugs.  No Known Allergies  No family history on file.   Prior to Admission medications   Medication Sig Start Date End Date Taking? Authorizing Provider  albuterol (VENTOLIN HFA) 108 (90 Base) MCG/ACT inhaler Inhale 1-2 puffs into the lungs every 6 (six) hours as needed for wheezing or shortness of breath.   Yes [provider]  amitriptyline (ELAVIL) 50 MG tablet Take 50 mg by mouth at bedtime.   Yes [provider]  clonazePAM (KLONOPIN) 1 MG tablet Take 1 mg by mouth 2 (two) times daily as needed for anxiety.   Yes [provider]  diphenhydrAMINE HCl, Sleep, (ZZZQUIL) 25 MG CAPS Take 25 mg by mouth at bedtime as needed (sleep).   Yes [provider]  oxyCODONE (OXYCONTIN) 10 mg 12 hr tablet Take 10 mg by mouth 4 (four) times daily as needed (pain).    Yes [provider]  sertraline (ZOLOFT) 100 MG tablet Take 100 mg by mouth daily.   Yes [provider]  ondansetron (ZOFRAN ODT) 4 MG disintegrating tablet Take 1 tablet (4 mg total) by mouth every 8 (eight) hours as needed for nausea or vomiting. Patient not taking: Reported on 09/01/2018 08/04/18   Robinson, [place] N, PA-C    Physical Exam: Vitals:   09/01/18 2225 09/01/18 2230 09/01/18 2238 09/01/18 2240  BP:  124/78    Pulse: 87 83 81 81  Resp: 16 14 13 15   Temp:      TempSrc:      SpO2: 100% 100% 100% 100%  Weight:      Height:          Constitutional: NAD, calm, comfortable, morbidly obese Vitals:   09/01/18 2225 09/01/18 2230 09/01/18 2238 09/01/18 2240  BP:  124/78    Pulse: 87 83 81 81  Resp: 16 14 13 15   Temp:      TempSrc:      SpO2: 100% 100% 100% 100%  Weight:      Height:       Eyes: PERRL, lids and conjunctivae pale ENMT: Mucous membranes are moist. Posterior pharynx clear of any exudate or lesions.Normal dentition.  Neck: normal, supple,  no masses, no thyromegaly Respiratory: clear to auscultation bilaterally, no wheezing, no crackles. Normal respiratory effort. No accessory muscle use.  Cardiovascular: Regular rate and rhythm, no murmurs / rubs / gallops. No extremity edema. 2+ pedal pulses. No carotid bruits.  No demonstrable orthostasis Abdomen: no tenderness, no masses palpated. No hepatosplenomegaly. Bowel sounds positive.  Musculoskeletal: no clubbing / cyanosis. No joint deformity upper and lower extremities. Good ROM, no contractures. Normal muscle tone.  Skin: no rashes, lesions, ulcers. No induration Neurologic: CN 2-12 grossly intact. Sensation intact, DTR normal. Strength 5/5 in all 4.  Psychiatric: Normal judgment and insight. Alert and oriented x 3. Normal mood.     Labs on Admission: I have personally reviewed following labs and imaging studies  CBC: Recent Labs  Lab 09/01/18 2119  WBC 5.6  NEUTROABS 2.5  HGB 7.1*  HCT 25.0*  MCV 80.4  PLT 184   Basic Metabolic Panel: Recent Labs  Lab 09/01/18 2119  NA 135  K 3.4*  CL 108  CO2 21*  GLUCOSE 88  BUN 24*  CREATININE 1.25*  CALCIUM 9.9   GFR: Estimated Creatinine Clearance: 79.9 mL/min (A) (by C-G formula based on SCr of 1.25 mg/dL (H)). Liver Function Tests: Recent Labs  Lab 09/01/18 2119  AST 18  ALT 15  ALKPHOS 46  BILITOT 0.6  PROT 6.2*  ALBUMIN 3.1*   No results for input(s): LIPASE, AMYLASE in the last 168 hours. No results for input(s): AMMONIA in the last 168 hours. Coagulation Profile: No results for input(s): INR, PROTIME in the last 168 hours. Cardiac Enzymes: Recent Labs  Lab 09/01/18 2119  TROPONINI <0.03   BNP (last 3 results) No results for input(s): PROBNP in the last 8760 hours. HbA1C: No results for input(s): HGBA1C in the last 72 hours. CBG: No results for input(s): GLUCAP in the last 168 hours. Lipid Profile: No results for input(s): CHOL, HDL, LDLCALC, TRIG, CHOLHDL, LDLDIRECT in the last 72  hours. Thyroid Function Tests: No results for input(s): TSH, T4TOTAL, FREET4, T3FREE, THYROIDAB in the last 72 hours. Anemia Panel: Recent Labs    09/01/18 2119  RETICCTPCT 2.3   Urine analysis:    Component Value Date/Time   COLORURINE YELLOW 09/01/2018 2205   APPEARANCEUR HAZY (A) 09/01/2018 2205   LABSPEC 1.008 09/01/2018 2205   PHURINE 6.0 09/01/2018 2205   GLUCOSEU NEGATIVE 09/01/2018 2205   HGBUR NEGATIVE 09/01/2018 2205   BILIRUBINUR NEGATIVE 09/01/2018 2205   KETONESUR NEGATIVE 09/01/2018 2205   PROTEINUR NEGATIVE 09/01/2018 2205   UROBILINOGEN 0.2 02/12/2015 1348   NITRITE NEGATIVE 09/01/2018 2205   LEUKOCYTESUR MODERATE (A) 09/01/2018 2205   Sepsis Labs: @LABRCNTIP (procalcitonin:4,lacticidven:4) )No results found for this or any previous visit (from the past 240 hour(s)).   Radiological Exams on Admission: Dg Chest Portable 1 View  Result Date: 09/01/2018 CLINICAL DATA:  Syncope. Chronic orthostatic hypotension. Dizziness today. EXAM: PORTABLE CHEST 1 VIEW COMPARISON:  08/10/2018 FINDINGS: The cardiomediastinal silhouette is within normal limits for portable AP technique. Multiple telemetry leads overlie the chest and partially obscure the right lung base. No airspace consolidation, edema, sizable pleural effusion, or pneumothorax is identified. No acute osseous abnormality is seen. IMPRESSION: No active disease. Electronically Signed   By: Sebastian Ache M.D.   On: 09/01/2018 21:42    EKG: Independently reviewed.  It shows normal sinus rhythm with a rate of 82.  Normal intervals with no significant ST changes.  Assessment/Plan Principal Problem:   Symptomatic anemia Active Problems:   AKI (acute kidney injury) (HCC)   Type 2 diabetes mellitus (HCC)   Hypokalemia     #1 symptomatic anemia: Patient is exhibiting presyncope and even syncope.  No evidence of acute bleed.  Previous work-up about a month ago showed possible iron deficiency.  Patient may be having  intermittent bleed that comes and goes.  She has been postmenopausal with her last menstrual period about 2 years ago.  She reported heavy menstrual.  When she was still having her periods.  Suspected iron deficiency.  At this point we will check LDH as well as TSH.  Iron indices with B12 and folic acid were checked previously.  We will repeat those testing and transfuse 2 units of packed red blood cells.  Monitor H&H.  If no obvious reason may need referral to hematology.  #2 diabetes: Initiate sliding scale insulin  with home regimen.  #3 hypokalemia: replete potassium.  #4 morbid obesity: Counseling provided.  #5 GERD: Continue with PPIs.  #6 history of schizophrenia: She appears to be stable at baseline.   DVT prophylaxis: SCD Code Status: Full code Family Communication: No family at bedside  Disposition Plan: Home Consults called: None Admission status: Observation  Severity of Illness: The appropriate patient status for this patient is OBSERVATION. Observation status is judged to be reasonable and necessary in order to provide the required intensity of service to ensure the patient's safety. The patient's presenting symptoms, physical exam findings, and initial radiographic and laboratory data in the context of their medical condition is felt to place them at decreased risk for further clinical deterioration. Furthermore, it is anticipated that the patient will be medically stable for discharge from the hospital within 2 midnights of admission. The following factors support the patient status of observation.   " The patient's presenting symptoms include syncope. " The physical exam findings include no significant findings on exam. " The initial radiographic and laboratory data are hemoglobin 7.1205 0298.     Lonia Blood MD [facility] Pager [phone]  If 7PM-7AM, please contact night-coverage [email or web address] Password [email or web address]  09/01/2018, 11:17 PM

## 2018-09-01 NOTE — ED Triage Notes (Signed)
Pt came to the ED due to orthostatic episode at [facility].

## 2018-09-01 NOTE — ED Triage Notes (Signed)
Patient arrives from [facility] from home with complaints of chronic orthostatic hypotension. Patient told EMS that she was at [facility] today and felt dizzy when standing. Patient on multiple blood pressure meds-patient called [facility] and was told to come to the ED.

## 2018-09-01 NOTE — ED Notes (Signed)
Pt ambulatory to the bathroom with 1 person assist. Pt unable to ambulate back to the room Pt c/o severe dizziness Pt placed on Steady and wheeled back to room

## 2018-09-01 NOTE — ED Provider Notes (Signed)
Emergency Department Provider Note   I have reviewed the triage vital signs and the nursing notes.   HISTORY  Chief Complaint Orthostatic Hypotension   HPI Monique Holland is a 53 y.o. female with PMH of GERD, HTN, Schizophrenia, DM, and kidney stone presents to the emergency department with lightheadedness with standing.  Patient states symptoms have been ongoing for the past month but worsening over the past several days.  She was admitted to the hospital in late March with sepsis related to kidney stone.  She underwent cystoscopy with lithotripsy and basket extraction on 3/25 with Dr. Alvester MorinBell.  She denies any fevers, body aches, chills.  No flank pain.  No dysuria, hesitancy, urgency.  She has completed her antibiotics and is feeling much better in terms of her infection type symptoms.  She states that today she had a brief syncopal episode with standing.  She states that she felt lightheaded and was able to catch herself from completely falling but feels like she may have passed out briefly.  She denies any chest pain, heart palpitations, shortness of breath.  She has continued her home blood pressure medications.  She did have hypotension during her recent hospitalization but this had improved at the time of discharge.    Past Medical History:  Diagnosis Date  . Anxiety   . Asthma   . Depression   . Diabetes mellitus without complication (HCC)   . Frequency of urination   . GERD (gastroesophageal reflux disease)   . History of asthma    last episode yrs ago  . Hypertension   . OSA (obstructive sleep apnea)    pt had study done oct 2014--  schedule for cpap titrate after kidney stone surgery  . Schizophrenia (HCC)   . Ureteral calculi    BILATERAL  . Wears glasses     Patient Active Problem List   Diagnosis Date Noted  . Schizophrenia (HCC) 09/02/2018  . Obesity, Class III, BMI 40-49.9 (morbid obesity) (HCC) 09/02/2018  . Iron deficiency 09/02/2018  . Symptomatic anemia  09/01/2018  . Hypokalemia 09/01/2018  . Severe sepsis (HCC) 08/11/2018  . AKI (acute kidney injury) (HCC) 08/11/2018  . Hypotension 08/11/2018  . Type 2 diabetes mellitus (HCC) 08/11/2018  . Acute appendicitis 05/22/2014  . Ureteral calculus, right 05/02/2013    Past Surgical History:  Procedure Laterality Date  . CESAREAN SECTION  1991  &  2002   w/ bilateral tubal ligation in 2002  . CYSTOSCOPY W/ URETERAL STENT PLACEMENT Right 08/10/2018   Procedure: CYSTOSCOPY WITH RETROGRADE PYELOGRAM/URETERAL STENT PLACEMENT;  Surgeon: Jerilee FieldEskridge, Matthew, MD;  Location: [facility];  Service: Urology;  Laterality: Right;  . CYSTOSCOPY WITH RETROGRADE PYELOGRAM, URETEROSCOPY AND STENT PLACEMENT Bilateral 05/02/2013   Procedure: CYSTOSCOPY WITH RETROGRADE PYELOGRAM, URETEROSCOPY ;  Surgeon: Valetta Fulleravid S Grapey, MD;  [facility];  Service: Urology;  Laterality: Bilateral;  . CYSTOSCOPY WITH RETROGRADE PYELOGRAM, URETEROSCOPY AND STENT PLACEMENT Right 08/15/2018   Procedure: CYSTOSCOPY WITH RIGHT RETROGRADE PYELOGRAM, RIGHT URETEROSCOPY WITH HOLMIUM LASER AND STENT PLACEMENT;  Surgeon: Crista ElliotBell, Eugene D III, MD;  Location: [facility];  Service: Urology;  Laterality: Right;  . CYSTOSCOPY WITH STENT PLACEMENT Left 05/02/2013   Procedure: CYSTOSCOPY WITH STENT PLACEMENT;  Surgeon: Valetta Fulleravid S Grapey, MD;  [facility];  Service: Urology;  Laterality: Left;  . HOLMIUM LASER APPLICATION Bilateral 05/02/2013   Procedure: HOLMIUM LASER APPLICATION;  Surgeon: Valetta Fulleravid S Grapey, MD;  [facility];  Service: Urology;  Laterality: Bilateral;  . LAPAROSCOPIC APPENDECTOMY N/A 05/22/2014   Procedure: APPENDECTOMY LAPAROSCOPIC;  Surgeon: Glenna Fellows, MD;  Location: [facility];  Service: General;  Laterality: N/A;  . TONSILLECTOMY  age 50    Allergies Patient has no known allergies.  No family history on file.  Social History Social History   Tobacco Use  .  Smoking status: Never Smoker  . Smokeless tobacco: Never Used  Substance Use Topics  . Alcohol use: No  . Drug use: No    Review of Systems  Constitutional: No fever/chills Eyes: No visual changes. ENT: No sore throat. Cardiovascular: Denies chest pain. Positive lightheadedness and syncope.  Respiratory: Denies shortness of breath. Gastrointestinal: No abdominal pain.  No nausea, no vomiting.  No diarrhea.  No constipation. Genitourinary: Negative for dysuria. Musculoskeletal: Negative for back pain. Skin: Negative for rash. Neurological: Negative for headaches, focal weakness or numbness.  10-point ROS otherwise negative.  ____________________________________________   PHYSICAL EXAM:  VITAL SIGNS: ED Triage Vitals  Enc Vitals Group     BP 09/01/18 2054 133/78     Pulse Rate 09/01/18 2054 84     Resp 09/01/18 2054 12     Temp 09/01/18 2054 99.3 F (37.4 C)     Temp Source 09/01/18 2054 Oral     SpO2 09/01/18 2052 99 %     Weight 09/01/18 2053 (!) 313 lb (142 kg)     Height 09/01/18 2053 5\' 8"  (1.727 m)     Pain Score 09/01/18 2052 0   Constitutional: Alert and oriented. Well appearing and in no acute distress. Eyes: Conjunctivae are normal. Head: Atraumatic. Nose: No congestion/rhinnorhea. Mouth/Throat: Mucous membranes are moist.  Neck: No stridor. Cardiovascular: Normal rate, regular rhythm. Good peripheral circulation. Grossly normal heart sounds.   Respiratory: Normal respiratory effort.  No retractions. Lungs CTAB. Gastrointestinal: Soft and nontender. No distention. Rectal exam performed with chaperone. No gross blood or melena. Hemoccult negative.  Musculoskeletal: No lower extremity tenderness nor edema. No gross deformities of extremities. Neurologic:  Normal speech and language. No gross focal neurologic deficits are appreciated.  Skin:  Skin is warm, dry and intact. No rash noted.  ____________________________________________   LABS (all labs  ordered are listed, but only abnormal results are displayed)  Labs Reviewed  CBC WITH DIFFERENTIAL/PLATELET - Abnormal; Notable for the following components:      Result Value   RBC 3.11 (*)    Hemoglobin 7.1 (*)    HCT 25.0 (*)    MCH 22.8 (*)    MCHC 28.4 (*)    RDW 17.6 (*)    All other components within normal limits  URINALYSIS, ROUTINE W REFLEX MICROSCOPIC - Abnormal; Notable for the following components:   APPearance HAZY (*)    Leukocytes,Ua MODERATE (*)    Bacteria, UA RARE (*)    All other components within normal limits  COMPREHENSIVE METABOLIC PANEL - Abnormal; Notable for the following components:   Potassium 3.4 (*)    CO2 21 (*)    BUN 24 (*)    Creatinine, Ser 1.25 (*)    Total Protein 6.2 (*)    Albumin 3.1 (*)    GFR calc non Af Amer 50 (*)    GFR calc Af Amer 58 (*)    All other components within normal limits  RETICULOCYTES - Abnormal; Notable for the following components:   RBC. 3.10 (*)    Immature Retic Fract 30.7 (*)    All other components within normal limits  GLUCOSE, CAPILLARY - Abnormal; Notable for the following components:   Glucose-Capillary 140 (*)    All other components within normal limits  COMPREHENSIVE METABOLIC PANEL - Abnormal; Notable for the following components:   CO2 21 (*)    Glucose, Bld 116 (*)    BUN 24 (*)    Creatinine, Ser 1.23 (*)    Calcium 11.3 (*)    Total Protein 6.1 (*)    Albumin 3.1 (*)    GFR calc non Af Amer 51 (*)    GFR calc Af Amer 59 (*)    All other components within normal limits  CBC - Abnormal; Notable for the following components:   Hemoglobin 9.4 (*)    HCT 33.1 (*)    MCH 23.1 (*)    MCHC 28.4 (*)    RDW 17.2 (*)    All other components within normal limits  GLUCOSE, CAPILLARY - Abnormal; Notable for the following components:   Glucose-Capillary 112 (*)    All other components within normal limits  GLUCOSE, CAPILLARY - Abnormal; Notable for the following components:   Glucose-Capillary 115  (*)    All other components within normal limits  TROPONIN I  LACTATE DEHYDROGENASE  POC OCCULT BLOOD, ED  TYPE AND SCREEN  PREPARE RBC (CROSSMATCH)  ABO/RH   ____________________________________________  EKG   EKG Interpretation  Date/Time:  Saturday September 01 2018 20:53:30 EDT Ventricular Rate:  82 PR Interval:    QRS Duration: 99 QT Interval:  380 QTC Calculation: 444 R Axis:   2 Text Interpretation:  Sinus rhythm No STEMI.  Confirmed by Alona Bene [phone]) on 09/01/2018 9:28:06 PM Also confirmed by Alona Bene [phone]), editor Sheppard Evens [id number])  on 09/02/2018 11:15:42 AM       ____________________________________________  RADIOLOGY  Dg Chest Portable 1 View  Result Date: 09/01/2018 CLINICAL DATA:  Syncope. Chronic orthostatic hypotension. Dizziness today. EXAM: PORTABLE CHEST 1 VIEW COMPARISON:  08/10/2018 FINDINGS: The cardiomediastinal silhouette is within normal limits for portable AP technique. Multiple telemetry leads overlie the chest and partially obscure the right lung base. No airspace consolidation, edema, sizable pleural effusion, or pneumothorax is identified. No acute osseous abnormality is seen. IMPRESSION: No active disease. Electronically Signed   By: Sebastian Ache M.D.   On: 09/01/2018 21:42    ____________________________________________   PROCEDURES  Procedure(s) performed:   Procedures  CRITICAL CARE Performed by: Maia Plan Total critical care time: 35 minutes Critical care time was exclusive of separately billable procedures and treating other patients. Critical care was necessary to treat or prevent imminent or life-threatening deterioration. Critical care was time spent personally by me on the following activities: development of treatment plan with patient and/or surrogate as well as nursing, discussions with consultants, evaluation of patient's response to treatment, examination of patient, obtaining history from patient or  surrogate, ordering and performing treatments and interventions, ordering and review of laboratory studies, ordering and review of radiographic studies, pulse oximetry and re-evaluation of patient's condition.  Alona Bene, MD Emergency Medicine  ____________________________________________   INITIAL IMPRESSION / ASSESSMENT AND PLAN / ED COURSE  Pertinent labs & imaging results that were available during my care of the patient were reviewed by me and considered in my medical decision making (see chart for details).   Patient presents to the emergency department for evaluation of near syncope with standing/ambulation.  Sinus rhythm on the monitor with normal blood pressure here.  No hypoxemia.  Extremely low suspicion for cardiogenic syncope.  Very  low suspicion for pulmonary embolism.  Plan for orthostatic vitals, screening labs, chest x-ray, EKG and reassess.  Labs show worsening anemia. Suspect that patient's symptoms are a result of anemia. Normal orthostatic vitals. No evidence of sepsis. CXR reviewed.   Discussed patient's case with Hospitalist, Dr. Mikeal Hawthorne to request admission. Patient and family (if present) updated with plan. Care transferred to Hospitalist service.  I reviewed all nursing notes, vitals, pertinent old records, EKGs, labs, imaging (as available).  ____________________________________________  FINAL CLINICAL IMPRESSION(S) / ED DIAGNOSES  Final diagnoses:  Symptomatic anemia  Syncope, unspecified syncope type     MEDICATIONS GIVEN DURING THIS VISIT:  Medications  0.9 %  sodium chloride infusion ( Intravenous Rate/Dose Change 09/02/18 1134)  ondansetron (ZOFRAN) tablet 4 mg (has no administration in time range)    Or  ondansetron (ZOFRAN) injection 4 mg (has no administration in time range)  insulin aspart (novoLOG) injection 0-9 Units (0 Units Subcutaneous Not Given 09/02/18 0800)  oxyCODONE (OXYCONTIN) 12 hr tablet 10 mg (has no administration in time range)   amitriptyline (ELAVIL) tablet 50 mg (has no administration in time range)  buPROPion (WELLBUTRIN) tablet 100 mg (100 mg Oral Given 09/02/18 1133)  diphenhydrAMINE (BENADRYL) capsule 25 mg (has no administration in time range)  lurasidone (LATUDA) tablet 60 mg (has no administration in time range)  OLANZapine (ZYPREXA) tablet 10 mg (has no administration in time range)  sertraline (ZOLOFT) tablet 100 mg (100 mg Oral Given 09/02/18 1134)  traZODone (DESYREL) tablet 100 mg (has no administration in time range)  pantoprazole (PROTONIX) EC tablet 40 mg (40 mg Oral Given 09/02/18 1134)  tamsulosin (FLOMAX) capsule 0.4 mg (0.4 mg Oral Given 09/02/18 1133)  clonazePAM (KLONOPIN) tablet 1 mg (has no administration in time range)  topiramate (TOPAMAX) tablet 50 mg (50 mg Oral Given 09/02/18 1133)  albuterol (PROVENTIL) (2.5 MG/3ML) 0.083% nebulizer solution 3 mL (has no administration in time range)  sodium chloride 0.9 % bolus 500 mL (0 mLs Intravenous Stopped 09/01/18 2226)  0.9 %  sodium chloride infusion (0 mL/hr Intravenous Stopped 09/01/18 2354)    Note:  This document was prepared using Dragon voice recognition software and may include unintentional dictation errors.  Alona Bene, MD Emergency Medicine    Monique Holland, Arlyss Repress, MD 09/02/18 1309

## 2018-09-02 ENCOUNTER — Encounter (HOSPITAL_COMMUNITY): Payer: Self-pay

## 2018-09-02 DIAGNOSIS — N179 Acute kidney failure, unspecified: Secondary | ICD-10-CM

## 2018-09-02 DIAGNOSIS — E66813 Obesity, class 3: Secondary | ICD-10-CM

## 2018-09-02 DIAGNOSIS — F209 Schizophrenia, unspecified: Secondary | ICD-10-CM

## 2018-09-02 DIAGNOSIS — E611 Iron deficiency: Secondary | ICD-10-CM

## 2018-09-02 DIAGNOSIS — E876 Hypokalemia: Secondary | ICD-10-CM

## 2018-09-02 DIAGNOSIS — E1169 Type 2 diabetes mellitus with other specified complication: Secondary | ICD-10-CM

## 2018-09-02 LAB — COMPREHENSIVE METABOLIC PANEL
ALT: 16 U/L (ref 0–44)
AST: 18 U/L (ref 15–41)
Albumin: 3.1 g/dL — ABNORMAL LOW (ref 3.5–5.0)
Alkaline Phosphatase: 47 U/L (ref 38–126)
Anion gap: 10 (ref 5–15)
BUN: 24 mg/dL — ABNORMAL HIGH (ref 6–20)
CO2: 21 mmol/L — ABNORMAL LOW (ref 22–32)
Calcium: 11.3 mg/dL — ABNORMAL HIGH (ref 8.9–10.3)
Chloride: 107 mmol/L (ref 98–111)
Creatinine, Ser: 1.23 mg/dL — ABNORMAL HIGH (ref 0.44–1.00)
GFR calc Af Amer: 59 mL/min — ABNORMAL LOW (ref >60)
GFR calc non Af Amer: 51 mL/min — ABNORMAL LOW (ref >60)
Glucose, Bld: 116 mg/dL — ABNORMAL HIGH (ref 70–99)
Potassium: 4 mmol/L (ref 3.5–5.1)
Sodium: 138 mmol/L (ref 135–145)
Total Bilirubin: 0.7 mg/dL (ref 0.3–1.2)
Total Protein: 6.1 g/dL — ABNORMAL LOW (ref 6.5–8.1)

## 2018-09-02 LAB — CBC
HCT: 33.1 % — ABNORMAL LOW (ref 36.0–46.0)
Hemoglobin: 9.4 g/dL — ABNORMAL LOW (ref 12.0–15.0)
MCH: 23.1 pg — ABNORMAL LOW (ref 26.0–34.0)
MCHC: 28.4 g/dL — ABNORMAL LOW (ref 30.0–36.0)
MCV: 81.3 fL (ref 80.0–100.0)
Platelets: 199 10*3/uL (ref 150–400)
RBC: 4.07 MIL/uL (ref 3.87–5.11)
RDW: 17.2 % — ABNORMAL HIGH (ref 11.5–15.5)
WBC: 5.2 10*3/uL (ref 4.0–10.5)
nRBC: 0 % (ref 0.0–0.2)

## 2018-09-02 LAB — GLUCOSE, CAPILLARY
Glucose-Capillary: 140 mg/dL — ABNORMAL HIGH (ref 70–99)
Glucose-Capillary: 112 mg/dL — ABNORMAL HIGH (ref 70–99)
Glucose-Capillary: 115 mg/dL — ABNORMAL HIGH (ref 70–99)
Glucose-Capillary: 127 mg/dL — ABNORMAL HIGH (ref 70–99)
Glucose-Capillary: 137 mg/dL — ABNORMAL HIGH (ref 70–99)

## 2018-09-02 MED ORDER — INSULIN ASPART 100 UNIT/ML ~~LOC~~ SOLN
0.0000 [IU] | Freq: Three times a day (TID) | SUBCUTANEOUS | Status: DC
  Administered 2018-09-03: 1 [IU] via SUBCUTANEOUS
  Administered 2018-09-03: 3 [IU] via SUBCUTANEOUS
  Administered 2018-09-04: 1 [IU] via SUBCUTANEOUS
  Administered 2018-09-04: 2 [IU] via SUBCUTANEOUS

## 2018-09-02 MED ORDER — BUPROPION HCL 100 MG PO TABS
100.0000 mg | ORAL_TABLET | Freq: Two times a day (BID) | ORAL | Status: DC
  Administered 2018-09-02 – 2018-09-04 (×5): 100 mg via ORAL
  Filled 2018-09-02 (×5): qty 1

## 2018-09-02 MED ORDER — DIPHENHYDRAMINE HCL 25 MG PO CAPS: 25.0000 mg | ORAL_CAPSULE | Freq: Every evening | ORAL | Status: DC | PRN

## 2018-09-02 MED ORDER — OXYCODONE HCL ER 10 MG PO T12A
10.0000 mg | EXTENDED_RELEASE_TABLET | Freq: Two times a day (BID) | ORAL | Status: DC | PRN
  Administered 2018-09-03: 10 mg via ORAL
  Filled 2018-09-02: qty 1

## 2018-09-02 MED ORDER — AMITRIPTYLINE HCL 25 MG PO TABS
50.0000 mg | ORAL_TABLET | Freq: Every day | ORAL | Status: DC
  Administered 2018-09-02 – 2018-09-03 (×2): 50 mg via ORAL
  Filled 2018-09-02 (×2): qty 2

## 2018-09-02 MED ORDER — LISINOPRIL-HYDROCHLOROTHIAZIDE 20-12.5 MG PO TABS: 1.0000 | ORAL_TABLET | Freq: Two times a day (BID) | ORAL | Status: DC

## 2018-09-02 MED ORDER — LISINOPRIL 20 MG PO TABS: 20.0000 mg | ORAL_TABLET | Freq: Two times a day (BID) | ORAL | Status: DC

## 2018-09-02 MED ORDER — CLONAZEPAM 1 MG PO TABS: 1.0000 mg | ORAL_TABLET | Freq: Two times a day (BID) | ORAL | Status: DC | PRN

## 2018-09-02 MED ORDER — ONDANSETRON HCL 4 MG/2ML IJ SOLN: 4.0000 mg | Freq: Four times a day (QID) | INTRAMUSCULAR | Status: DC | PRN

## 2018-09-02 MED ORDER — LURASIDONE HCL 40 MG PO TABS
60.0000 mg | ORAL_TABLET | Freq: Every day | ORAL | Status: DC
  Administered 2018-09-02 – 2018-09-03 (×2): 60 mg via ORAL
  Filled 2018-09-02 (×2): qty 1

## 2018-09-02 MED ORDER — ONDANSETRON HCL 4 MG PO TABS: 4.0000 mg | ORAL_TABLET | Freq: Four times a day (QID) | ORAL | Status: DC | PRN

## 2018-09-02 MED ORDER — ALBUTEROL SULFATE (2.5 MG/3ML) 0.083% IN NEBU: 3.0000 mL | INHALATION_SOLUTION | Freq: Four times a day (QID) | RESPIRATORY_TRACT | Status: DC | PRN

## 2018-09-02 MED ORDER — PROPRANOLOL HCL 20 MG PO TABS: 80.0000 mg | ORAL_TABLET | Freq: Two times a day (BID) | ORAL | Status: DC

## 2018-09-02 MED ORDER — HYDROCHLOROTHIAZIDE 12.5 MG PO CAPS: 12.5000 mg | ORAL_CAPSULE | Freq: Two times a day (BID) | ORAL | Status: DC

## 2018-09-02 MED ORDER — TOPIRAMATE 25 MG PO TABS
50.0000 mg | ORAL_TABLET | Freq: Two times a day (BID) | ORAL | Status: DC
  Administered 2018-09-02 – 2018-09-03 (×3): 50 mg via ORAL
  Filled 2018-09-02 (×3): qty 2

## 2018-09-02 MED ORDER — SERTRALINE HCL 100 MG PO TABS
100.0000 mg | ORAL_TABLET | Freq: Every day | ORAL | Status: DC
  Administered 2018-09-02 – 2018-09-04 (×3): 100 mg via ORAL
  Filled 2018-09-02 (×3): qty 1

## 2018-09-02 MED ORDER — OLANZAPINE 10 MG PO TABS
10.0000 mg | ORAL_TABLET | Freq: Every day | ORAL | Status: DC
  Administered 2018-09-02 – 2018-09-03 (×2): 10 mg via ORAL
  Filled 2018-09-02 (×2): qty 1

## 2018-09-02 MED ORDER — TRAZODONE HCL 100 MG PO TABS
100.0000 mg | ORAL_TABLET | Freq: Every day | ORAL | Status: DC
  Administered 2018-09-02 – 2018-09-03 (×2): 100 mg via ORAL
  Filled 2018-09-02 (×2): qty 1

## 2018-09-02 MED ORDER — AMLODIPINE BESYLATE 10 MG PO TABS: 10.0000 mg | ORAL_TABLET | Freq: Every day | ORAL | Status: DC

## 2018-09-02 MED ORDER — TAMSULOSIN HCL 0.4 MG PO CAPS
0.4000 mg | ORAL_CAPSULE | Freq: Every day | ORAL | Status: DC
  Administered 2018-09-02 – 2018-09-04 (×3): 0.4 mg via ORAL
  Filled 2018-09-02 (×3): qty 1

## 2018-09-02 MED ORDER — PANTOPRAZOLE SODIUM 40 MG PO TBEC
40.0000 mg | DELAYED_RELEASE_TABLET | Freq: Every day | ORAL | Status: DC
  Administered 2018-09-02 – 2018-09-04 (×3): 40 mg via ORAL
  Filled 2018-09-02 (×3): qty 1

## 2018-09-02 NOTE — ED Notes (Signed)
Report attempted to unit at this time.  Floor unaware of pending admit.  Staff to call back when ready.

## 2018-09-02 NOTE — ED Notes (Signed)
ED TO INPATIENT HANDOFF REPORT  ED Nurse Name and Phone #: Lossie Faes Name/Age/Gender Monique Holland 53 y.o. female Room/Bed: WA15/WA15  Code Status   Code Status: Full Code  Home/SNF/Other Home Patient oriented to: self, place, time and situation Is this baseline? Yes   Triage Complete: Triage complete  Chief Complaint Orthostatic hypotension x 2 months  Triage Note Pt came to the ED due to orthostatic episode at [facility].   Patient arrives from [facility] from home with complaints of chronic orthostatic hypotension. Patient told EMS that she was at [facility] today and felt dizzy when standing. Patient on multiple blood pressure meds-patient called [facility] and was told to come to the ED.   Allergies No Known Allergies  Level of Care/Admitting Diagnosis ED Disposition    ED Disposition Condition Comment   Admit  Hospital Area: [facility] [[id number]]  Level of Care: Med-Surg [16]  Diagnosis: Symptomatic anemia [1610960]  Admitting Physician: Rometta Emery [[id number]]  Attending Physician: Rometta Emery [[id number]]  Estimated length of stay: past midnight tomorrow  Certification:: I certify this patient will need inpatient services for at least 2 midnights  PT Class (Do Not Modify): Inpatient [101]  PT Acc Code (Do Not Modify): Private [1]       B Medical/Surgery History Past Medical History:  Diagnosis Date  . Anxiety   . Asthma   . Depression   . Diabetes mellitus without complication (HCC)   . Frequency of urination   . GERD (gastroesophageal reflux disease)   . History of asthma    last episode yrs ago  . Hypertension   . OSA (obstructive sleep apnea)    pt had study done oct 2014--  schedule for cpap titrate after kidney stone surgery  . Schizophrenia (HCC)   . Ureteral calculi    BILATERAL  . Wears glasses    Past Surgical History:  Procedure Laterality Date  . CESAREAN SECTION  1991  &  2002   w/ bilateral tubal  ligation in 2002  . CYSTOSCOPY W/ URETERAL STENT PLACEMENT Right 08/10/2018   Procedure: CYSTOSCOPY WITH RETROGRADE PYELOGRAM/URETERAL STENT PLACEMENT;  Surgeon: Jerilee Field, MD;  Location: [facility];  Service: Urology;  Laterality: Right;  . CYSTOSCOPY WITH RETROGRADE PYELOGRAM, URETEROSCOPY AND STENT PLACEMENT Bilateral 05/02/2013   Procedure: CYSTOSCOPY WITH RETROGRADE PYELOGRAM, URETEROSCOPY ;  Surgeon: Valetta Fuller, MD;  Location: [facility];  Service: Urology;  Laterality: Bilateral;  . CYSTOSCOPY WITH RETROGRADE PYELOGRAM, URETEROSCOPY AND STENT PLACEMENT Right 08/15/2018   Procedure: CYSTOSCOPY WITH RIGHT RETROGRADE PYELOGRAM, RIGHT URETEROSCOPY WITH HOLMIUM LASER AND STENT PLACEMENT;  Surgeon: Crista Elliot, MD;  Location: [facility];  Service: Urology;  Laterality: Right;  . CYSTOSCOPY WITH STENT PLACEMENT Left 05/02/2013   Procedure: CYSTOSCOPY WITH STENT PLACEMENT;  Surgeon: Valetta Fuller, MD;  Location: [facility];  Service: Urology;  Laterality: Left;  . HOLMIUM LASER APPLICATION Bilateral 05/02/2013   Procedure: HOLMIUM LASER APPLICATION;  Surgeon: Valetta Fuller, MD;  Location: [facility];  Service: Urology;  Laterality: Bilateral;  . LAPAROSCOPIC APPENDECTOMY N/A 05/22/2014   Procedure: APPENDECTOMY LAPAROSCOPIC;  Surgeon: Glenna Fellows, MD;  Location: [facility];  Service: General;  Laterality: N/A;  . TONSILLECTOMY  age 89     A IV Location/Drains/Wounds Patient Lines/Drains/Airways Status   Active Line/Drains/Airways    Name:   Placement date:   Placement time:   Site:   Days:  Peripheral IV 09/01/18 Left Antecubital   09/01/18    -    Antecubital   1   Ureteral Drain/Stent Left ureter 6 Fr.   05/02/13    1558    Left ureter   1949   Ureteral Drain/Stent Right ureter 6 Fr.   08/15/18    0933    Right ureter   18   Incision 05/02/13 Perineum Bilateral   05/02/13    1435     1949   Incision (Closed) 08/15/18 Perineum  Other (Comment)   08/15/18    0940     18   Incision - 3 Ports Abdomen 1: Right;Upper 2: Left;Mid 3: Left;Lower   05/22/14    1910     1564          Intake/Output Last 24 hours  Intake/Output Summary (Last 24 hours) at 09/02/2018 0046 Last data filed at 09/01/2018 2354 Gross per 24 hour  Intake 512.83 ml  Output -  Net 512.83 ml    Labs/Imaging Results for orders placed or performed during the hospital encounter of 09/01/18 (from the past 48 hour(s))  Troponin I - Once     Status: None   Collection Time: 09/01/18  9:19 PM  Result Value Ref Range   Troponin I <0.03 <0.03 ng/mL    Comment: Performed at [facility], [address] [place], [place] [address]  CBC with Differential     Status: Abnormal   Collection Time: 09/01/18  9:19 PM  Result Value Ref Range   WBC 5.6 4.0 - 10.5 K/uL   RBC 3.11 (L) 3.87 - 5.11 MIL/uL   Hemoglobin 7.1 (L) 12.0 - 15.0 g/dL   HCT 60.4 (L) 54.0 - 98.1 %   MCV 80.4 80.0 - 100.0 fL   MCH 22.8 (L) 26.0 - 34.0 pg   MCHC 28.4 (L) 30.0 - 36.0 g/dL   RDW 19.1 (H) 47.8 - 29.5 %   Platelets 184 150 - 400 K/uL   nRBC 0.0 0.0 - 0.2 %   Neutrophils Relative % 45 %   Neutro Abs 2.5 1.7 - 7.7 K/uL   Lymphocytes Relative 41 %   Lymphs Abs 2.3 0.7 - 4.0 K/uL   Monocytes Relative 10 %   Monocytes Absolute 0.6 0.1 - 1.0 K/uL   Eosinophils Relative 3 %   Eosinophils Absolute 0.2 0.0 - 0.5 K/uL   Basophils Relative 1 %   Basophils Absolute 0.0 0.0 - 0.1 K/uL   Immature Granulocytes 0 %   Abs Immature Granulocytes 0.01 0.00 - 0.07 K/uL    Comment: Performed at [facility], [address], [place], [place] [address]  Comprehensive metabolic panel     Status: Abnormal   Collection Time: 09/01/18  9:19 PM  Result Value Ref Range   Sodium 135 135 - 145 mmol/L   Potassium 3.4 (L) 3.5 - 5.1 mmol/L   Chloride 108 98 - 111 mmol/L   CO2 21 (L) 22 - 32 mmol/L   Glucose, Bld 88 70 - 99 mg/dL   BUN 24 (H) 6 - 20 mg/dL    Creatinine, Ser 8.65 (H) 0.44 - 1.00 mg/dL   Calcium 9.9 8.9 - 78.4 mg/dL   Total Protein 6.2 (L) 6.5 - 8.1 g/dL   Albumin 3.1 (L) 3.5 - 5.0 g/dL   AST 18 15 - 41 U/L   ALT 15 0 - 44 U/L   Alkaline Phosphatase 46 38 - 126 U/L   Total  Bilirubin 0.6 0.3 - 1.2 mg/dL   GFR calc non Af Amer 50 (L) >60 mL/min   GFR calc Af Amer 58 (L) >60 mL/min   Anion gap 6 5 - 15    Comment: Performed at [facility], [address] [place], [place] [address]  Reticulocytes     Status: Abnormal   Collection Time: 09/01/18  9:19 PM  Result Value Ref Range   Retic Ct Pct 2.3 0.4 - 3.1 %   RBC. 3.10 (L) 3.87 - 5.11 MIL/uL   Retic Count, Absolute 72.5 19.0 - 186.0 K/uL   Immature Retic Fract 30.7 (H) 2.3 - 15.9 %    Comment: Performed at [facility], [address], [place], [place] [address]  Lactate dehydrogenase     Status: None   Collection Time: 09/01/18  9:19 PM  Result Value Ref Range   LDH 104 98 - 192 U/L    Comment: Performed at [facility], [address] [place], [place] [address]  Urinalysis, Routine w reflex microscopic     Status: Abnormal   Collection Time: 09/01/18 10:05 PM  Result Value Ref Range   Color, Urine YELLOW YELLOW   APPearance HAZY (A) CLEAR   Specific Gravity, Urine 1.008 1.005 - 1.030   pH 6.0 5.0 - 8.0   Glucose, UA NEGATIVE NEGATIVE mg/dL   Hgb urine dipstick NEGATIVE NEGATIVE   Bilirubin Urine NEGATIVE NEGATIVE   Ketones, ur NEGATIVE NEGATIVE mg/dL   Protein, ur NEGATIVE NEGATIVE mg/dL   Nitrite NEGATIVE NEGATIVE   Leukocytes,Ua MODERATE (A) NEGATIVE   RBC / HPF 0-5 0 - 5 RBC/hpf   WBC, UA 21-50 0 - 5 WBC/hpf   Bacteria, UA RARE (A) NONE SEEN   Squamous Epithelial / LPF 6-10 0 - 5   Mucus PRESENT    Hyaline Casts, UA PRESENT     Comment: Performed at [facility], [address] [place], [place] [address]  POC occult blood, ED Provider will collect     Status: None   Collection  Time: 09/01/18 10:26 PM  Result Value Ref Range   Fecal Occult Bld NEGATIVE NEGATIVE  ABO/Rh     Status: None   Collection Time: 09/01/18 10:29 PM  Result Value Ref Range   ABO/RH(D)      A POS Performed at [facility], [address] [place], [place] [address]   Type and screen [facility]     Status: None (Preliminary result)   Collection Time: 09/01/18 10:30 PM  Result Value Ref Range   ABO/RH(D) A POS    Antibody Screen NEG    Sample Expiration 09/04/2018    Unit Number [id number]    Blood Component Type RED CELLS,LR    Unit division 00    Status of Unit ALLOCATED    Transfusion Status OK TO TRANSFUSE    Crossmatch Result Compatible    Unit Number [id number]    Blood Component Type RED CELLS,LR    Unit division 00    Status of Unit ISSUED    Transfusion Status OK TO TRANSFUSE    Crossmatch Result      Compatible Performed at [facility], [address], [place], [place] [address]   Prepare RBC     Status: None   Collection Time: 09/01/18 10:30 PM  Result Value Ref Range   Order Confirmation      ORDER PROCESSED BY BLOOD BANK Performed  at [facility], [address] [place], [place] [id number]    Dg Chest Portable 1 View  Result Date: 09/01/2018 CLINICAL DATA:  Syncope. Chronic orthostatic hypotension. Dizziness today. EXAM: PORTABLE CHEST 1 VIEW COMPARISON:  08/10/2018 FINDINGS: The cardiomediastinal silhouette is within normal limits for portable AP technique. Multiple telemetry leads overlie the chest and partially obscure the right lung base. No airspace consolidation, edema, sizable pleural effusion, or pneumothorax is identified. No acute osseous abnormality is seen. IMPRESSION: No active disease. Electronically Signed   By: Sebastian AcheAllen  Grady M.D.   On: 09/01/2018 21:42    Pending Labs [employer] Labs (From admission, onward)    Start     Ordered   Signed and Held  Comprehensive  metabolic panel  Tomorrow morning,   R     Signed and Held   Signed and Held  CBC  Tomorrow morning,   R     Signed and Held          Vitals/Pain Today's Vitals   09/02/18 0030 09/02/18 0035 09/02/18 0036 09/02/18 0037  BP: 107/63     Pulse: 79 83 80 80  Resp: 14 (!) 24 18 (!) 22  Temp:      TempSrc:      SpO2: 100% 99% 100% 100%  Weight:      Height:      PainSc:        Isolation Precautions No active isolations  Medications Medications  0.9 %  sodium chloride infusion ( Intravenous New Bag/Given 09/01/18 2334)  sodium chloride 0.9 % bolus 500 mL (0 mLs Intravenous Stopped 09/01/18 2226)  0.9 %  sodium chloride infusion (0 mL/hr Intravenous Stopped 09/01/18 2354)    Mobility walks with device Low fall risk   Focused Assessments Neuro Assessment Handoff:  Swallow screen pass? Yes  Cardiac Rhythm: Normal sinus rhythm       Neuro Assessment:   Neuro Checks:      Last Documented NIHSS Modified Score:   Has TPA been given? No If patient is a Neuro Trauma and patient is going to OR before floor call report to 4N Charge nurse: [phone] or [phone]     R Recommendations: See Admitting Provider Note  Report given to:   Additional Notes: Low Hemoglobin, dizziness

## 2018-09-02 NOTE — Progress Notes (Addendum)
PROGRESS NOTE    Monique Holland  UEA:[id number] DOB: 04/11/1966 DOA: 09/01/2018 PCP: Mikael SprayMcRae, Lauren, NP    Brief Narrative:  53 year old female who presented with dizziness and near syncope.  She does have significant past medical history for schizophrenia, type 2 diabetes mellitus, anemia chronic disease with iron deficiency, hypertension, GERD and nephrolithiasis.  Recent hospitalization March 25 for nephrolithiasis sp lithotripsy.  Post  hospitalization she has been experiencing dizziness and orthostatic symptoms. On the day of admission while ambulating in the grocery store, pushing the cart, she had severe weakness on her lower extremities, had near syncope episode and fell down to the floor, no frank loss of consciousness. Denied gross hematuria, hematemesis or melena.  On her initial physical examination blood pressure 124/78, heart rate 83, respiratory rate 14, oxygen saturation 100%.  She was pale, moist mucous membranes, lungs clear to auscultation bilaterally, heart S1-S2 present and rhythmic, abdomen soft, no lower extremity edema.  Sodium 135, potassium 3.4, chloride 108, bicarb 21, glucose 88, BUN 24, creatinine 1.25, white count 5.6, hemoglobin 7.1, hematocrit 25.0, platelets 184.  Urine analysis had 0-5 red cells, 21-50 white cells.  Her chest radiograph was hypoinflated.  Her EKG had 86 bpm, normal axis, normal intervals, sinus rhythm with normal conduction, J-point elevation in V3 to V5, no significant ST segment or T wave changes.  Patient was admitted to the hospital working diagnosis of symptomatic anemia   Assessment & Plan:   Principal Problem:   Symptomatic anemia Active Problems:   AKI (acute kidney injury) (HCC)   Hypotension   Type 2 diabetes mellitus (HCC)   Schizophrenia (HCC)   Obesity, Class III, BMI 40-49.9 (morbid obesity) (HCC)   Iron deficiency  1. Symptomatic anemia. No signs of acute bleeding, her symptoms have improved after 2 units PRBC, but not feeling  back to normal. Her nadir Hgb was 7.1, this am is up to 9.4. Will check Iron panel in am, may need further IV iron.   2. AKI with hypokalemia. Renal function stable with serum cr at 1,23 with K at 4,0 and serum bicarbonate at 21. Will follow on renal panel in am. Last year serum cr was 0.92.   3. T2DM. Will continue glucose cover and monitoring, patient tolerating po well. No nausea or vomiting.   4. Morbid obesity. Calculated BMI is 47, will need outpatient follow up, inpatient physical therapy.   5. Schizophrenia. Will resume her psychotropic medications, including amitriptyline, bupropion, clonazepam, olanzapine, trazodone, topiramate, latuda and sertraline.   6. HTN. Her blood pressure has been low, HYPOTENSION, will continue to hold on antihypertensive agents, continue gentle hydration with IV fluids. Continue blood pressure monitoring.    DVT prophylaxis: enoxaparin   Code Status:  full Family Communication:  No family at the bedside  Disposition Plan/ discharge barriers: pending physical therapy evaluation and H&H follow up in am.   Body mass index is 47.59 kg/m. Malnutrition Type:      Malnutrition Characteristics:      Nutrition Interventions:     RN Pressure Injury Documentation:     Consultants:     Procedures:     Antimicrobials:       Subjective: Patient is feeling better, but continue to be very weak and deconditioned, no nausea or vomiting. At home had dizziness with orthostatic symptoms, she had severe weakness in her lower extremities while walking, that triggered a fall, no frank loss of consciousness.   Objective: Vitals:   09/02/18 0243 09/02/18 0255 09/02/18 0310 09/02/18  0522  BP: (!) 96/58 (!) 96/58 104/69 (!) 97/57  Pulse: 79 79 80 80  Resp: 13 16 16 18   Temp: 97.9 F (36.6 C) 97.9 F (36.6 C) 98.5 F (36.9 C) 98.6 F (37 C)  TempSrc: Oral Oral Oral Oral  SpO2: 99% 99% 97% 97%  Weight:      Height:        Intake/Output  Summary (Last 24 hours) at 09/02/2018 1003 Last data filed at 09/02/2018 0929 Gross per 24 hour  Intake 1295.58 ml  Output 1300 ml  Net -4.42 ml   Filed Weights   09/01/18 2053  Weight: (!) 142 kg    Examination:    General: deconditioned  Neurology: Awake and alert, non focal  E ENT: mild pallor, no icterus, oral mucosa moist Cardiovascular: No JVD. S1-S2 present, rhythmic, no gallops, rubs, or murmurs. No lower extremity edema. Pulmonary: positive breath sounds bilaterally, adequate air movement, no wheezing, rhonchi or rales. Gastrointestinal. Abdomen protuberant with no organomegaly, non tender, no rebound or guarding Skin. No rashes Musculoskeletal: no joint deformities     Data Reviewed: I have personally reviewed following labs and imaging studies  CBC: Recent Labs  Lab 09/01/18 2119 09/02/18 0720  WBC 5.6 5.2  NEUTROABS 2.5  --   HGB 7.1* 9.4*  HCT 25.0* 33.1*  MCV 80.4 81.3  PLT 184 199   Basic Metabolic Panel: Recent Labs  Lab 09/01/18 2119 09/02/18 0720  NA 135 138  K 3.4* 4.0  CL 108 107  CO2 21* 21*  GLUCOSE 88 116*  BUN 24* 24*  CREATININE 1.25* 1.23*  CALCIUM 9.9 11.3*   GFR: Estimated Creatinine Clearance: 81.2 mL/min (A) (by C-G formula based on SCr of 1.23 mg/dL (H)). Liver Function Tests: Recent Labs  Lab 09/01/18 2119 09/02/18 0720  AST 18 18  ALT 15 16  ALKPHOS 46 47  BILITOT 0.6 0.7  PROT 6.2* 6.1*  ALBUMIN 3.1* 3.1*   No results for input(s): LIPASE, AMYLASE in the last 168 hours. No results for input(s): AMMONIA in the last 168 hours. Coagulation Profile: No results for input(s): INR, PROTIME in the last 168 hours. Cardiac Enzymes: Recent Labs  Lab 09/01/18 2119  TROPONINI <0.03   BNP (last 3 results) No results for input(s): PROBNP in the last 8760 hours. HbA1C: No results for input(s): HGBA1C in the last 72 hours. CBG: Recent Labs  Lab 09/02/18 0107 09/02/18 0739  GLUCAP 140* 112*   Lipid Profile: No  results for input(s): CHOL, HDL, LDLCALC, TRIG, CHOLHDL, LDLDIRECT in the last 72 hours. Thyroid Function Tests: No results for input(s): TSH, T4TOTAL, FREET4, T3FREE, THYROIDAB in the last 72 hours. Anemia Panel: Recent Labs    09/01/18 2119  RETICCTPCT 2.3      Radiology Studies: I have reviewed all of the imaging during this hospital visit personally     Scheduled Meds: . insulin aspart  0-9 Units Subcutaneous TID WC   Continuous Infusions: . sodium chloride 100 mL/hr at 09/02/18 0541     LOS: 1 day        Jamiah Homeyer Annett Gula, MD

## 2018-09-03 ENCOUNTER — Inpatient Hospital Stay (HOSPITAL_COMMUNITY): Payer: 59

## 2018-09-03 DIAGNOSIS — I952 Hypotension due to drugs: Secondary | ICD-10-CM

## 2018-09-03 DIAGNOSIS — E611 Iron deficiency: Secondary | ICD-10-CM

## 2018-09-03 LAB — CBC WITH DIFFERENTIAL/PLATELET
Abs Immature Granulocytes: 0.02 10*3/uL (ref 0.00–0.07)
Basophils Absolute: 0 10*3/uL (ref 0.0–0.1)
Basophils Relative: 0 %
Eosinophils Absolute: 0.2 10*3/uL (ref 0.0–0.5)
Eosinophils Relative: 3 %
HCT: 31.5 % — ABNORMAL LOW (ref 36.0–46.0)
Hemoglobin: 9.4 g/dL — ABNORMAL LOW (ref 12.0–15.0)
Immature Granulocytes: 0 %
Lymphocytes Relative: 47 %
Lymphs Abs: 2.6 10*3/uL (ref 0.7–4.0)
MCH: 23.9 pg — ABNORMAL LOW (ref 26.0–34.0)
MCHC: 29.8 g/dL — ABNORMAL LOW (ref 30.0–36.0)
MCV: 79.9 fL — ABNORMAL LOW (ref 80.0–100.0)
Monocytes Absolute: 0.8 10*3/uL (ref 0.1–1.0)
Monocytes Relative: 14 %
Neutro Abs: 2 10*3/uL (ref 1.7–7.7)
Neutrophils Relative %: 36 %
Platelets: 202 10*3/uL (ref 150–400)
RBC: 3.94 MIL/uL (ref 3.87–5.11)
RDW: 17.2 % — ABNORMAL HIGH (ref 11.5–15.5)
WBC: 5.5 10*3/uL (ref 4.0–10.5)
nRBC: 0 % (ref 0.0–0.2)

## 2018-09-03 LAB — IRON AND TIBC
Iron: 36 ug/dL (ref 28–170)
Saturation Ratios: 11 % (ref 10.4–31.8)
TIBC: 318 ug/dL (ref 250–450)
UIBC: 282 ug/dL

## 2018-09-03 LAB — TYPE AND SCREEN
ABO/RH(D): A POS
Antibody Screen: NEGATIVE
Unit division: 0
Unit division: 0

## 2018-09-03 LAB — FERRITIN: Ferritin: 121 ng/mL (ref 11–307)

## 2018-09-03 LAB — GLUCOSE, CAPILLARY
Glucose-Capillary: 120 mg/dL — ABNORMAL HIGH (ref 70–99)
Glucose-Capillary: 122 mg/dL — ABNORMAL HIGH (ref 70–99)

## 2018-09-03 LAB — BASIC METABOLIC PANEL
Anion gap: 6 (ref 5–15)
BUN: 18 mg/dL (ref 6–20)
CO2: 24 mmol/L (ref 22–32)
Calcium: 11.4 mg/dL — ABNORMAL HIGH (ref 8.9–10.3)
Chloride: 109 mmol/L (ref 98–111)
Creatinine, Ser: 0.91 mg/dL (ref 0.44–1.00)
GFR calc Af Amer: >60 mL/min (ref >60)
GFR calc non Af Amer: >60 mL/min (ref >60)
Glucose, Bld: 134 mg/dL — ABNORMAL HIGH (ref 70–99)
Potassium: 3.9 mmol/L (ref 3.5–5.1)
Sodium: 139 mmol/L (ref 135–145)

## 2018-09-03 LAB — BPAM RBC
Blood Product Expiration Date: 202004172359
Blood Product Expiration Date: 202004172359
ISSUE DATE / TIME: 202004112345
ISSUE DATE / TIME: 202004120249
Unit Type and Rh: 6200
Unit Type and Rh: 6200

## 2018-09-03 LAB — TRANSFERRIN: Transferrin: 227 mg/dL (ref 192–382)

## 2018-09-03 IMAGING — CT CT HEAD WITHOUT CONTRAST
3 series · 16 of 47 positions shown, 19 images · non-contrast
Comparison: [DATE]

CLINICAL DATA: Dizziness. Syncopal episode. History of
hypertension.

EXAM:
CT HEAD WITHOUT CONTRAST
TECHNIQUE: Contiguous axial images were obtained from the base of the skull
through the vertex without intravenous contrast.

[Series 2: head wo · axial · 0.46mm/px · z∈[-33,+92]mm · 10 of 30 slices shown, 13 images]
[im 3/30  brain]
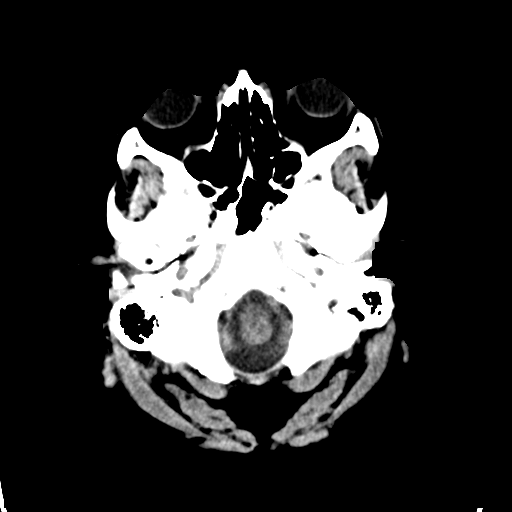
[im 3/30  bone]
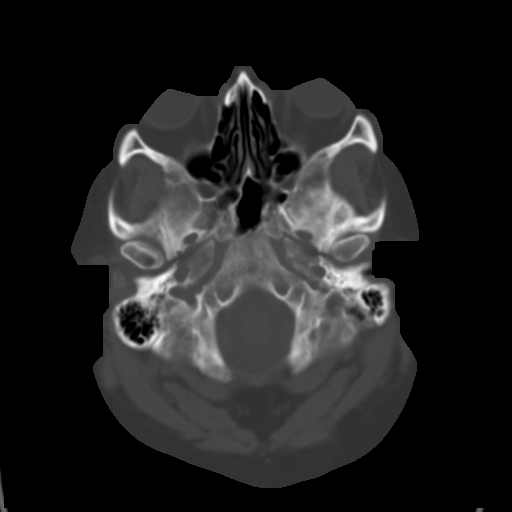
[im 6/30  brain]
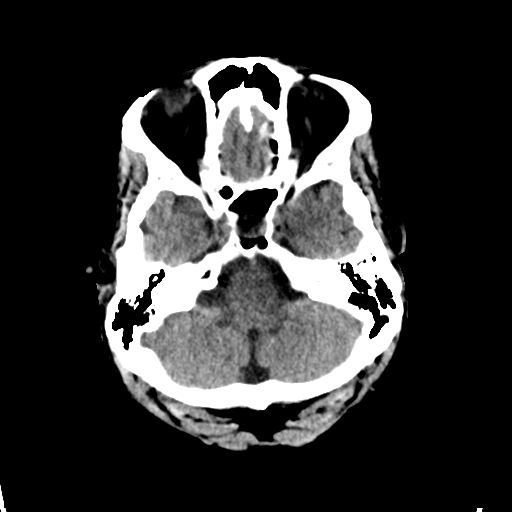
[im 9/30  brain]
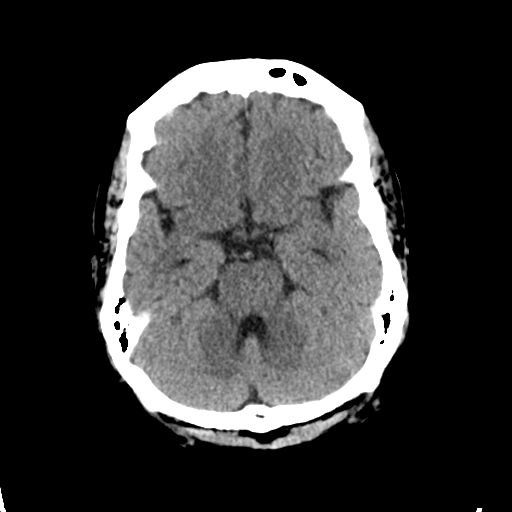
[im 11/30  brain]
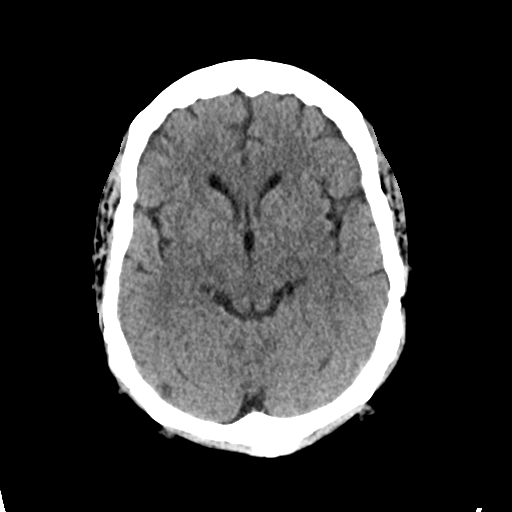
[im 14/30  brain]
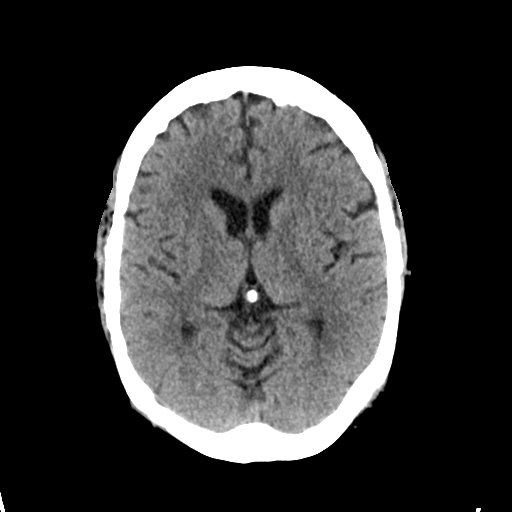
[im 14/30  bone]
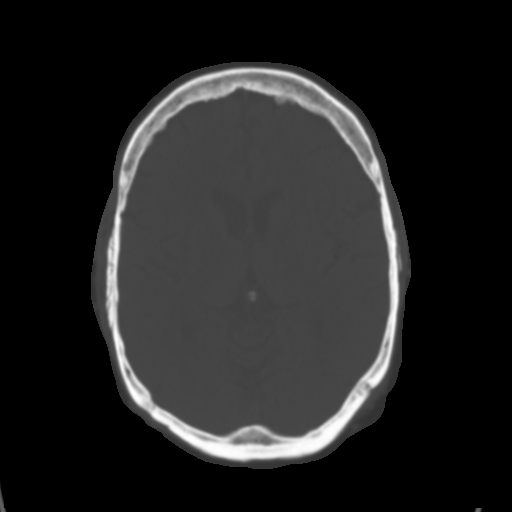
[im 17/30  brain]
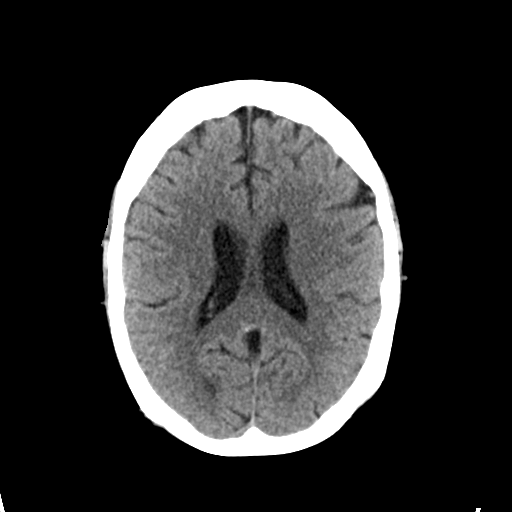
[im 20/30  brain]
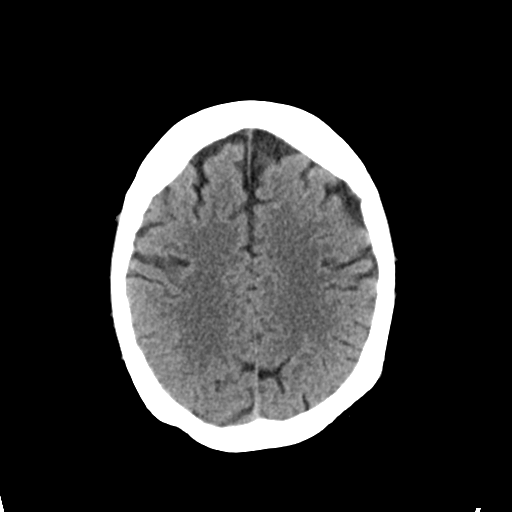
[im 23/30  brain]
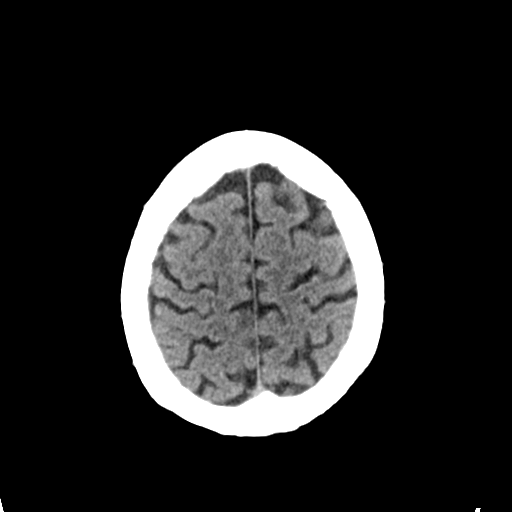
[im 25/30  brain]
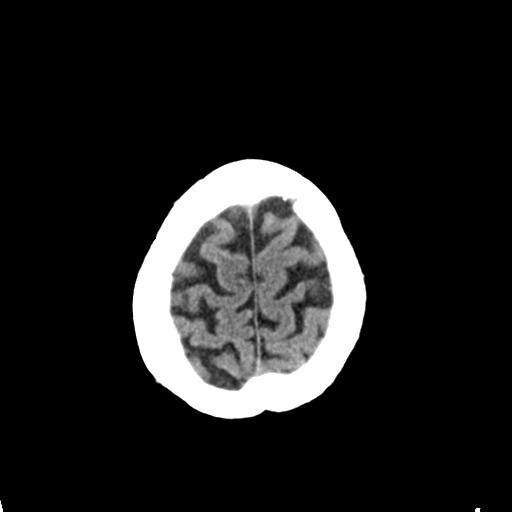
[im 25/30  bone]
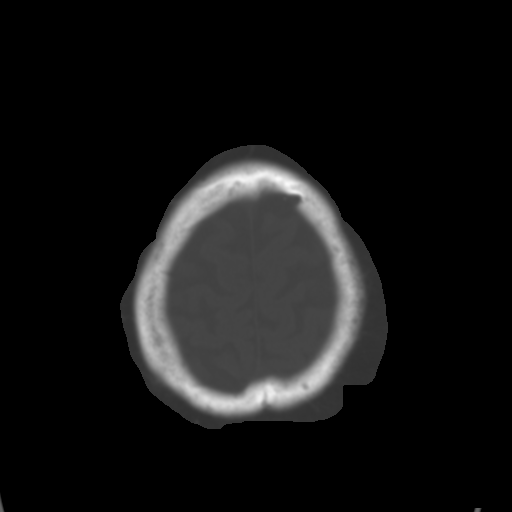
[im 28/30  brain]
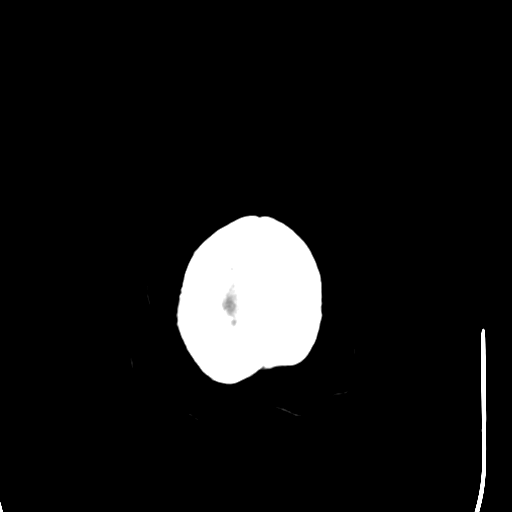

[Series 4: coronal soft tissue · coronal · 0.30mm/px · 3 of 67 slices shown]
[im 23/67  brain]
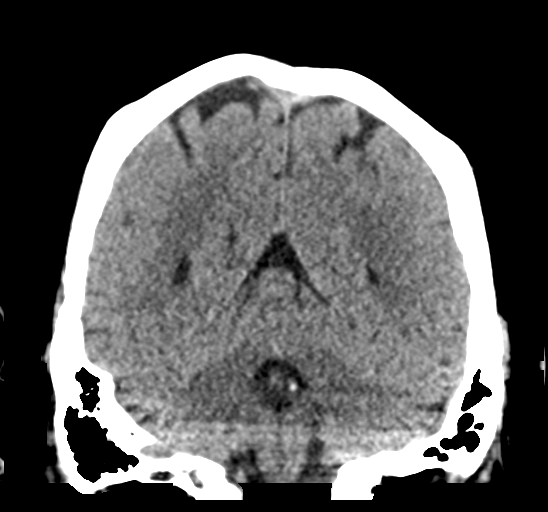
[im 30/67  brain]
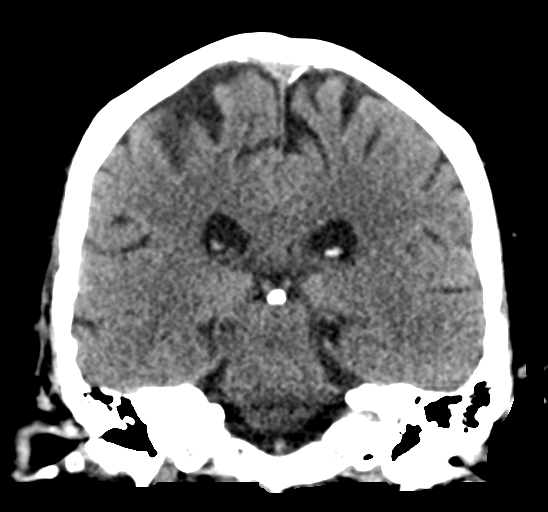
[im 37/67  brain]
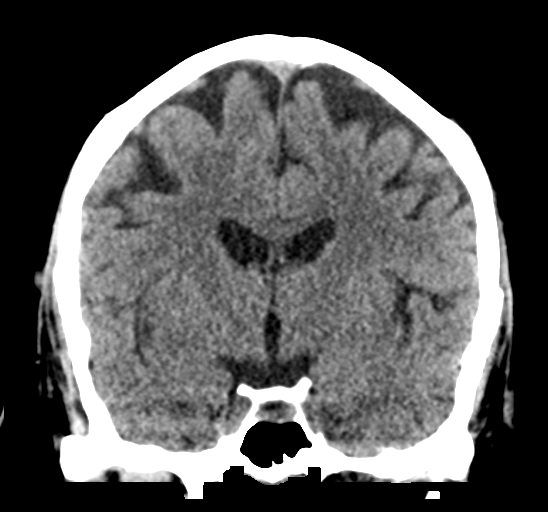

[Series 5: sagittal soft tissue · sagittal · 0.31mm/px · 3 of 50 slices shown]
[im 17/50  brain]
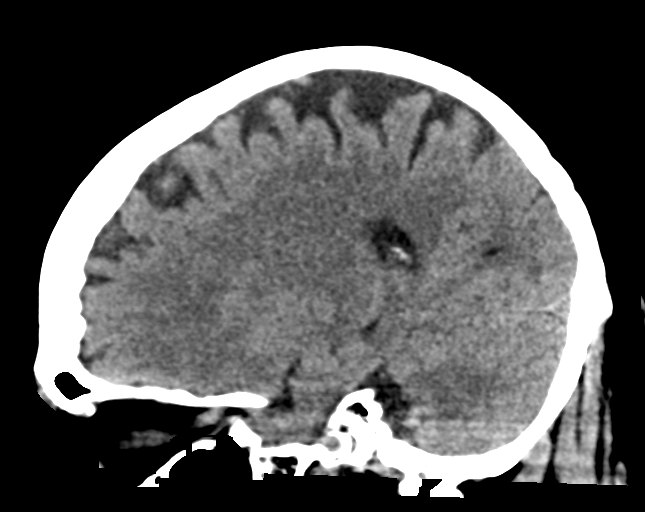
[im 25/50  brain]
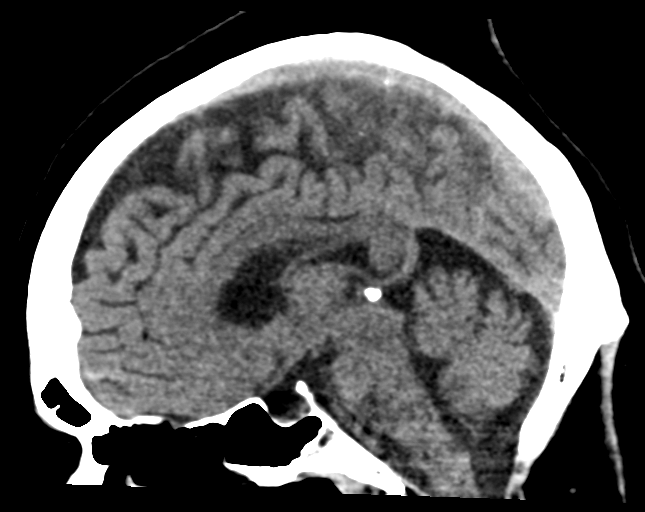
[im 33/50  brain]
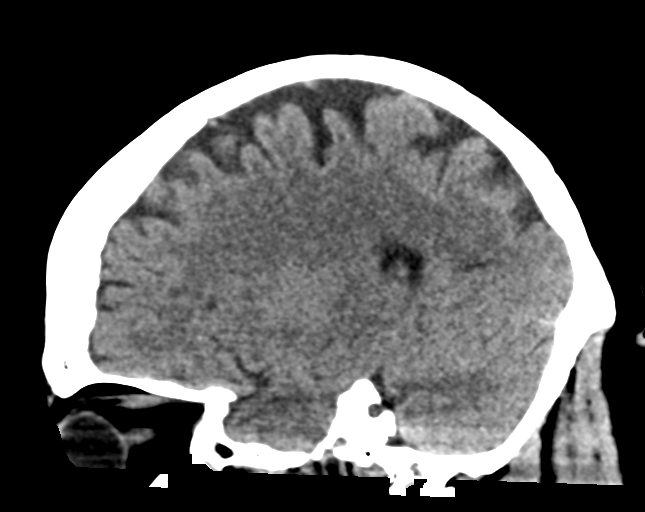

[16 of 47 positions shown; findings below may reference images not displayed]

FINDINGS: Brain: Mild generalized atrophy. No evidence of old or acute focal
infarction, mass lesion, hemorrhage, hydrocephalus or extra-axial
collection.

Vascular: No abnormal vascular finding.

Skull: Negative

Sinuses/Orbits: Clear/normal

Other: None
IMPRESSION: No acute or focal finding.  Mild generalized atrophy.

## 2018-09-03 MED ORDER — SODIUM CHLORIDE 0.9 % IV SOLN
510.0000 mg | Freq: Once | INTRAVENOUS | Status: DC
  Filled 2018-09-03: qty 17

## 2018-09-03 MED ORDER — POLYETHYLENE GLYCOL 3350 17 G PO PACK
17.0000 g | PACK | Freq: Every day | ORAL | Status: DC
  Administered 2018-09-03 – 2018-09-04 (×2): 17 g via ORAL
  Filled 2018-09-03 (×2): qty 1

## 2018-09-03 NOTE — Progress Notes (Signed)
Pt ambulating to bathroom, denies dizziness.

## 2018-09-03 NOTE — Evaluation (Signed)
Physical Therapy Evaluation Patient Details Name: Monique NorrieLisa Holland MRN: [id number] DOB: 04/11/1966 Today's Date: 09/03/2018   History of Present Illness  Pt is a 53 year old female with significant past medical history for schizophrenia, type 2 diabetes mellitus, anemia chronic disease with iron deficiency, hypertension, GERD and nephrolithiasis.  Pt admitted for Symptomatic anemia/ anemia of chronic disease combined with iron deficiency  Clinical Impression  Pt admitted with above diagnosis. Pt currently with functional limitations due to the deficits listed below (see PT Problem List).  Pt will benefit from skilled PT to increase their independence and safety with mobility to allow discharge to the venue listed below.  Pt only tolerated short distance ambulation due to reported dizziness. Vitals upon return to recliner: 115/80 mmHg, 93 bpm, 100% SPO2 room air.  Pt declines using assistive device and states she would not use even if she had one at home.  Pt anticipates d/c home soon however reports concern that dizziness has not completely resolved.      Follow Up Recommendations Home health PT;Supervision for mobility/OOB    Equipment Recommendations  Rolling walker with 5" wheels(however pt declines)    Recommendations for Other Services       Precautions / Restrictions Precautions Precautions: Fall      Mobility  Bed Mobility Overal bed mobility: Needs Assistance Bed Mobility: Supine to Sit     Supine to sit: Supervision        Transfers Overall transfer level: Needs assistance Equipment used: None Transfers: Sit to/from Stand Sit to Stand: Supervision            Ambulation/Gait Ambulation/Gait assistance: Min guard;Supervision Gait Distance (Feet): 30 Feet Assistive device: None Gait Pattern/deviations: Step-through pattern;Decreased stride length     General Gait Details: pt mildly unsteady, however not agreeable to use of assistive device (will not use at  home), pt reports moderate dizziness however states improved since admission  Stairs            Wheelchair Mobility    Modified Rankin (Stroke Patients Only)       Balance Overall balance assessment: Mild deficits observed, not formally tested(reports dizziness for past month)                                           Pertinent Vitals/Pain Pain Assessment: No/denies pain    Home Living Family/patient expects to be discharged to:: Private residence Living Arrangements: Spouse/significant other   Type of Home: House       Home Layout: One level Home Equipment: None      Prior Function Level of Independence: Independent               Hand Dominance        Extremity/Trunk Assessment        Lower Extremity Assessment Lower Extremity Assessment: Generalized weakness       Communication   Communication: No difficulties  Cognition Arousal/Alertness: Awake/alert Behavior During Therapy: WFL for tasks assessed/performed Overall Cognitive Status: Within Functional Limits for tasks assessed                                        General Comments      Exercises     Assessment/Plan    PT Assessment Patient needs continued PT services  PT Problem List Decreased mobility;Decreased strength;Decreased activity tolerance;Decreased balance;Decreased knowledge of use of DME       PT Treatment Interventions DME instruction;Functional mobility training;Balance training;Gait training;Therapeutic activities;Stair training;Therapeutic exercise;Patient/family education    PT Goals (Current goals can be found in the Care Plan section)  Acute Rehab PT Goals PT Goal Formulation: With patient Time For Goal Achievement: 09/10/18 Potential to Achieve Goals: Good    Frequency Min 3X/week   Barriers to discharge        Co-evaluation               AM-PAC PT "6 Clicks" Mobility  Outcome Measure Help needed turning  from your back to your side while in a flat bed without using bedrails?: A Little Help needed moving from lying on your back to sitting on the side of a flat bed without using bedrails?: A Little Help needed moving to and from a bed to a chair (including a wheelchair)?: A Little Help needed standing up from a chair using your arms (e.g., wheelchair or bedside chair)?: A Little Help needed to walk in hospital room?: A Little Help needed climbing 3-5 steps with a railing? : A Little 6 Click Score: 18    End of Session Equipment Utilized During Treatment: Gait belt Activity Tolerance: Patient tolerated treatment well Patient left: in chair;with call bell/phone within reach;with chair alarm set   PT Visit Diagnosis: Other abnormalities of gait and mobility (R26.89)    Time: 5809-9833 PT Time Calculation (min) (ACUTE ONLY): 15 min   Charges:   PT Evaluation $PT Eval Low Complexity: 1 Low         Monique Holland, PT, DPT Acute Rehabilitation Services Office: [phone] Pager: [phone]  Monique Holland 09/03/2018, 12:58 PM

## 2018-09-03 NOTE — Discharge Summary (Addendum)
Physician Discharge Summary  Monique Holland ZOX:[id number] DOB: [birth year] DOA: 09/01/2018  PCP: Monique Spray, NP  Admit date: 09/01/2018 Discharge date: 09/03/2018  Admitted From: Home  Disposition:  Home   Recommendations for Outpatient Follow-up and new medication changes:  1. Follow up with Monique Holland in 7 days.  2. Daily home blood pressure monitoring, prescribed home blood pressure monitor.  3. Patient was advised to hold on antihypertensive agents until blood pressure greater than 140/90. Start with amlodipine first.  4. Patient receive one dose of IV Iron follow iron panel in 3 weeks.  5. Hypercalcemia, PTH pending   Home Health: Yes Equipment/Devices: Home blood pressure monitor.  Discharge Condition: stable  CODE STATUS: full  Diet recommendation: heart healthy and diabetic prudent.   Brief/Interim Summary: 53 year old female who presented with dizziness and near syncope.  She does have significant past medical history for schizophrenia, type 2 diabetes mellitus, anemia chronic disease with iron deficiency, hypertension, GERD and nephrolithiasis.  Recent hospitalization March 25 for nephrolithiasis sp lithotripsy.  Post  hospitalization she has been experiencing dizziness and orthostatic symptoms. On the day of admission while ambulating in the grocery store, pushing the cart, she had severe weakness on her lower extremities, had near syncope episode and fell down to the floor, no frank loss of consciousness. Denied gross hematuria, hematemesis or melena.  On her initial physical examination blood pressure 124/78, heart rate 83, respiratory rate 14, oxygen saturation 100%.  She was pale, moist mucous membranes, lungs clear to auscultation bilaterally, heart S1-S2 present and rhythmic, abdomen soft, no lower extremity edema.  Sodium 135, potassium 3.4, chloride 108, bicarb 21, glucose 88, BUN 24, creatinine 1.25, white count 5.6, hemoglobin 7.1, hematocrit 25.0, platelets 184.  Urine  analysis had 0-5 red cells, 21-50 white cells.  Her chest radiograph was hypoinflated.  Her EKG had 86 bpm, normal axis, normal intervals, sinus rhythm with normal conduction, J-point elevation in V3 to V5, no significant ST segment or T wave changes.  Patient was admitted to the hospital working diagnosis of symptomatic anemia.   1.  Symptomatic anemia/ anemia of chronic disease combined with iron deficiency.  Patient was admitted to the medical ward, and received 2 units packed red blood cells, with improvement of her hemoglobin and hematocrit, up to 9.4 and 31.5.  No signs of active bleeding.  Further work-up revealed iron of 36, TIBC 318, transferrin saturation 11, ferritin 121 and transferrin 227.  Patient received 1 dose of IV iron, recommendation to repeat iron panel in 3 weeks.  Further follow-up as an outpatient.   2.  Hypertension.  Her antihypertensive agents were held during her hospitalization, she had episodes of hypotension with a systolic blood pressure down to 97, at discharge is up to 117 mmHg. Recommendations to continue holding antihypertensive agents, home blood pressure monitoring, and to resume amlodipine if blood pressure greater than 140/90.  Close follow-up as an outpatient.  3.  Acute kidney injury with hypokalemia.  Patient received IV fluids and potassium chloride, her kidney function improved with serum creatinine at discharge 0.91, potassium up to 3.9, serum bicarb 24.  4.  Type 2 diabetes mellitus.  Patient was placed on insulin sliding scale while hospitalized, at discharge she will resume her oral hypoglycemic agents, metformin, sitagliptin, glimepride.   5.  Morbid obesity.  Calculated BMI is 47, she does have ambulatory dysfunction, will follow-up with physical therapy evaluation before discharge.  6.  Schizophrenia.  Patient is on multiple psychotropic agents including amitriptyline, bupropion,  clonazepam, olanzapine, trazodone , topamax, Latuda and sertraline.   Certainly polypharmacy can be linked to dizziness, I recommend close follow-up as an outpatient and possible discontinue some of his medications. I did left a message at her primary care office to address this issue. I called [facility] in regards her medications, she is not taking amitriptyline since January, Topamax and clonazepam are not listed, and [name] NP has been prescribing olanzapine, trazodone and Latuda and sertraline.   7. Renal stones and hypercalcemia.  Her calcium has been 10.1 to 11.4.  Will check a intact PTH before discharge.  Has no neurologic focal deficit. Will need close up as outpatient. Continue flomax.   Discharge Diagnoses:  Principal Problem:   Symptomatic anemia Active Problems:   AKI (acute kidney injury) (HCC)   Hypotension   Type 2 diabetes mellitus (HCC)   Schizophrenia (HCC)   Obesity, Class III, BMI 40-49.9 (morbid obesity) (HCC)   Iron deficiency    Discharge Instructions   Allergies as of 09/04/2018   No Known Allergies     Medication List    STOP taking these medications   amLODipine 10 MG tablet Commonly known as:  NORVASC   clonazePAM 1 MG tablet Commonly known as:  KLONOPIN   lisinopril-hydrochlorothiazide 20-12.5 MG tablet Commonly known as:  PRINZIDE,ZESTORETIC   OxyCONTIN 10 mg 12 hr tablet Generic drug:  oxyCODONE   propranolol 80 MG tablet Commonly known as:  INDERAL   topiramate 50 MG tablet Commonly known as:  TOPAMAX     TAKE these medications   amitriptyline 50 MG tablet Commonly known as:  ELAVIL Take 50 mg by mouth at bedtime.   buPROPion 100 MG tablet Commonly known as:  WELLBUTRIN Take 100 mg by mouth 2 (two) times daily.   cephALEXin 500 MG capsule Commonly known as:  KEFLEX Take 500 mg by mouth 2 (two) times daily.   glimepiride 2 MG tablet Commonly known as:  AMARYL Take 2 mg by mouth daily with breakfast.   lurasidone 40 MG Tabs tablet Commonly known as:  LATUDA Take 60 mg by mouth  at bedtime.   metFORMIN 1000 MG tablet Commonly known as:  GLUCOPHAGE Take 1,000 mg by mouth 2 (two) times daily with a meal.   OLANZapine 10 MG tablet Commonly known as:  ZYPREXA Take 10 mg by mouth at bedtime.   omeprazole 40 MG capsule Commonly known as:  PRILOSEC Take 40 mg by mouth daily after breakfast.   ondansetron 4 MG disintegrating tablet Commonly known as:  Zofran ODT Take 1 tablet (4 mg total) by mouth every 8 (eight) hours as needed for nausea or vomiting.   polyethylene glycol 17 g packet Commonly known as:  MIRALAX / GLYCOLAX Take 17 g by mouth daily for 30 days. Start taking on:  September 05, 2018   sertraline 100 MG tablet Commonly known as:  ZOLOFT Take 100 mg by mouth daily.   sitaGLIPtin 100 MG tablet Commonly known as:  JANUVIA Take 100 mg by mouth daily.   tamsulosin 0.4 MG Caps capsule Commonly known as:  FLOMAX Take 0.4 mg by mouth daily after breakfast.   traZODone 100 MG tablet Commonly known as:  DESYREL Take 100 mg by mouth at bedtime.   Ventolin HFA 108 (90 Base) MCG/ACT inhaler Generic drug:  albuterol Inhale 1-2 puffs into the lungs every 6 (six) hours as needed for wheezing or shortness of breath.   ZzzQuil 25 MG Caps Generic drug:  diphenhydrAMINE HCl (  Sleep) Take 25 mg by mouth at bedtime as needed (sleep).            Durable Medical Equipment  (From admission, onward)         Start     Ordered   09/03/18 1312  For home use only DME Walker  Once    Question:  Patient needs a walker to treat with the following condition  Answer:  Ambulatory dysfunction   09/03/18 1311   09/03/18 0000  DME Other see comment    Comments:  Home blood pressure monitoring, check blood pressure daily in the morning and keep a log.   09/03/18 1029          No Known Allergies  Consultations:     Procedures/Studies: Ct Abdomen Pelvis Wo Contrast  Result Date: 08/04/2018 CLINICAL DATA:  Right-sided abdominal pain and flank pain for 1  month. EXAM: CT ABDOMEN AND PELVIS WITHOUT CONTRAST TECHNIQUE: Multidetector CT imaging of the abdomen and pelvis was performed following the standard protocol without IV contrast. COMPARISON:  CT abdomen pelvis 09/09/2017 FINDINGS: Lower chest: Normal heart size.  Lung bases are clear. Hepatobiliary: Liver is diffusely low in attenuation compatible with steatosis. Gallbladder is unremarkable. No intrahepatic or extrahepatic biliary ductal dilatation. Pancreas: Unremarkable Spleen: Unremarkable Adrenals/Urinary Tract: Normal adrenal glands. Bilateral medullary nephrocalcinosis. There is a 7 mm stone within the proximal right ureter (image 46; series 2). This results in mild-to-moderate right hydronephrosis. No left-sided ureteral stones. Urinary bladder is unremarkable. Stomach/Bowel: No abnormal bowel wall thickening or evidence for bowel obstruction. No free fluid or free intraperitoneal air. Normal morphology of the stomach. Vascular/Lymphatic: Normal caliber abdominal aorta. No retroperitoneal lymphadenopathy. Reproductive: Uterus and adnexal structures are unremarkable. Other: None. Musculoskeletal: Lumbar spine degenerative changes. Lower thoracic spine degenerative changes. No aggressive or acute appearing osseous lesions. Grade 1 anterolisthesis L4 and L5 with bilateral L4 pars defects. IMPRESSION: There is an obstructing stone (7 mm) within the proximal right ureter resulting in mild to moderate right hydronephrosis. Bilateral medullary nephrocalcinosis. Electronically Signed   By: Annia Belt M.D.   On: 08/04/2018 18:31   Dg Chest 1 View  Result Date: 08/10/2018 CLINICAL DATA:  Fall at doctor's office with head injury. Knee pain. EXAM: CHEST  1 VIEW COMPARISON:  Radiographs 05/18/2018 and 07/23/2017. FINDINGS: 1631 hours. Lesser degree of inspiration with patchy right basilar pulmonary opacity, probably atelectasis. There is no edema, confluent airspace opacity, pleural effusion or pneumothorax. No  acute osseous findings are evident. Telemetry leads overlie the chest. IMPRESSION: Patchy right basilar opacity likely represents atelectasis given the lesser degree of inspiration. No acute findings. Electronically Signed   By: Carey Bullocks M.D.   On: 08/10/2018 17:13   Dg Abdomen 1 View  Result Date: 08/10/2018 CLINICAL DATA:  Fall at doctor's office with head injury. Knee pain. Known kidney stone. EXAM: ABDOMEN - 1 VIEW COMPARISON:  CT abdomen 08/04/2018. FINDINGS: The bowel gas pattern is normal. Bilateral nephrolithiasis again noted. The previously demonstrated proximal right ureteral calculus is likely positioned over the lower sacrum. No acute osseous findings. Mild lumbar spondylosis. IMPRESSION: Apparent interval distal passage of the proximal right ureteral calculus, now overlapping the sacrum, consistent with location in the distal ureter. Bilateral nephrolithiasis. Electronically Signed   By: Carey Bullocks M.D.   On: 08/10/2018 17:18   Ct Head Wo Contrast  Result Date: 08/10/2018 CLINICAL DATA:  Larey Seat and hit head with left-sided swelling EXAM: CT HEAD WITHOUT CONTRAST CT MAXILLOFACIAL WITHOUT CONTRAST CT CERVICAL  SPINE WITHOUT CONTRAST TECHNIQUE: Multidetector CT imaging of the head, cervical spine, and maxillofacial structures were performed using the standard protocol without intravenous contrast. Multiplanar CT image reconstructions of the cervical spine and maxillofacial structures were also generated. COMPARISON:  None. FINDINGS: CT HEAD FINDINGS Brain: No acute territorial infarction, hemorrhage, or intracranial mass. Mild atrophy. Normal ventricle size. Vascular: No hyperdense vessel.  No unexpected calcification Skull: No depressed skull fracture Other: None CT MAXILLOFACIAL FINDINGS Osseous: No acute nasal bone fracture. Bilateral mandibular heads are normally position. No mandibular fracture. Mastoid air cells are clear. Pterygoid plates and zygomatic arches are intact. Prominent  root lucency right maxillary central incisor. Orbits: Negative. No traumatic or inflammatory finding. Sinuses: Clear. Soft tissues: Negative. CT CERVICAL SPINE FINDINGS Alignment: Straightening of the cervical spine. No subluxation. Facet alignment maintained Skull base and vertebrae: No acute fracture. No primary bone lesion or focal pathologic process. Soft tissues and spinal canal: No prevertebral fluid or swelling. No visible canal hematoma. Disc levels:  Mild diffuse degenerative changes C4 through C7. Upper chest: Negative. Other: None IMPRESSION: 1. No CT evidence for acute intracranial abnormality.  Mild atrophy 2. No acute facial bone fracture 3. Straightening of the cervical spine without acute osseous abnormality. Electronically Signed   By: Jasmine Pang M.D.   On: 08/10/2018 16:48   Ct Cervical Spine Wo Contrast  Result Date: 08/10/2018 CLINICAL DATA:  Larey Seat and hit head with left-sided swelling EXAM: CT HEAD WITHOUT CONTRAST CT MAXILLOFACIAL WITHOUT CONTRAST CT CERVICAL SPINE WITHOUT CONTRAST TECHNIQUE: Multidetector CT imaging of the head, cervical spine, and maxillofacial structures were performed using the standard protocol without intravenous contrast. Multiplanar CT image reconstructions of the cervical spine and maxillofacial structures were also generated. COMPARISON:  None. FINDINGS: CT HEAD FINDINGS Brain: No acute territorial infarction, hemorrhage, or intracranial mass. Mild atrophy. Normal ventricle size. Vascular: No hyperdense vessel.  No unexpected calcification Skull: No depressed skull fracture Other: None CT MAXILLOFACIAL FINDINGS Osseous: No acute nasal bone fracture. Bilateral mandibular heads are normally position. No mandibular fracture. Mastoid air cells are clear. Pterygoid plates and zygomatic arches are intact. Prominent root lucency right maxillary central incisor. Orbits: Negative. No traumatic or inflammatory finding. Sinuses: Clear. Soft tissues: Negative. CT CERVICAL  SPINE FINDINGS Alignment: Straightening of the cervical spine. No subluxation. Facet alignment maintained Skull base and vertebrae: No acute fracture. No primary bone lesion or focal pathologic process. Soft tissues and spinal canal: No prevertebral fluid or swelling. No visible canal hematoma. Disc levels:  Mild diffuse degenerative changes C4 through C7. Upper chest: Negative. Other: None IMPRESSION: 1. No CT evidence for acute intracranial abnormality.  Mild atrophy 2. No acute facial bone fracture 3. Straightening of the cervical spine without acute osseous abnormality. Electronically Signed   By: Jasmine Pang M.D.   On: 08/10/2018 16:48   Dg Chest Portable 1 View  Result Date: 09/01/2018 CLINICAL DATA:  Syncope. Chronic orthostatic hypotension. Dizziness today. EXAM: PORTABLE CHEST 1 VIEW COMPARISON:  08/10/2018 FINDINGS: The cardiomediastinal silhouette is within normal limits for portable AP technique. Multiple telemetry leads overlie the chest and partially obscure the right lung base. No airspace consolidation, edema, sizable pleural effusion, or pneumothorax is identified. No acute osseous abnormality is seen. IMPRESSION: No active disease. Electronically Signed   By: Sebastian Ache M.D.   On: 09/01/2018 21:42   Dg Knee Complete 4 Views Left  Result Date: 08/10/2018 CLINICAL DATA:  Fall at doctor's office with head injury. Knee pain. EXAM: LEFT KNEE - COMPLETE 4+  VIEW COMPARISON:  Radiographs 09/05/2017 and 10/08/2015. FINDINGS: The mineralization and alignment are normal. There is no evidence of acute fracture or dislocation. Age advanced tricompartmental osteoarthritis is again noted with severe joint space narrowing and osteophyte formation in the medial and patellofemoral compartments. No significant joint effusion. IMPRESSION: No acute osseous findings. Advanced tricompartmental osteoarthritis. Electronically Signed   By: Carey BullocksWilliam  Veazey M.D.   On: 08/10/2018 17:16   Dg C-arm 1-60 Min-no  Report  Result Date: 08/15/2018 Fluoroscopy was utilized by the requesting physician.  No radiographic interpretation.   Dg C-arm 1-60 Min-no Report  Result Date: 08/10/2018 Fluoroscopy was utilized by the requesting physician.  No radiographic interpretation.   Ct Maxillofacial Wo Contrast  Result Date: 08/10/2018 CLINICAL DATA:  Larey SeatFell and hit head with left-sided swelling EXAM: CT HEAD WITHOUT CONTRAST CT MAXILLOFACIAL WITHOUT CONTRAST CT CERVICAL SPINE WITHOUT CONTRAST TECHNIQUE: Multidetector CT imaging of the head, cervical spine, and maxillofacial structures were performed using the standard protocol without intravenous contrast. Multiplanar CT image reconstructions of the cervical spine and maxillofacial structures were also generated. COMPARISON:  None. FINDINGS: CT HEAD FINDINGS Brain: No acute territorial infarction, hemorrhage, or intracranial mass. Mild atrophy. Normal ventricle size. Vascular: No hyperdense vessel.  No unexpected calcification Skull: No depressed skull fracture Other: None CT MAXILLOFACIAL FINDINGS Osseous: No acute nasal bone fracture. Bilateral mandibular heads are normally position. No mandibular fracture. Mastoid air cells are clear. Pterygoid plates and zygomatic arches are intact. Prominent root lucency right maxillary central incisor. Orbits: Negative. No traumatic or inflammatory finding. Sinuses: Clear. Soft tissues: Negative. CT CERVICAL SPINE FINDINGS Alignment: Straightening of the cervical spine. No subluxation. Facet alignment maintained Skull base and vertebrae: No acute fracture. No primary bone lesion or focal pathologic process. Soft tissues and spinal canal: No prevertebral fluid or swelling. No visible canal hematoma. Disc levels:  Mild diffuse degenerative changes C4 through C7. Upper chest: Negative. Other: None IMPRESSION: 1. No CT evidence for acute intracranial abnormality.  Mild atrophy 2. No acute facial bone fracture 3. Straightening of the cervical  spine without acute osseous abnormality. Electronically Signed   By: Jasmine PangKim  Fujinaga M.D.   On: 08/10/2018 16:48      Procedures:   Subjective: Patient is feeling better, continue to have some dizziness, no nausea or vomiting, tolerating po well.   Discharge Exam: Vitals:   09/02/18 2056 09/03/18 0501  BP: (!) 101/59 117/67  Pulse: 87 81  Resp: 16 18  Temp: 98.7 F (37.1 C) 98.4 F (36.9 C)  SpO2: 98% 96%   Vitals:   09/02/18 0522 09/02/18 1339 09/02/18 2056 09/03/18 0501  BP: (!) 97/57 (!) 107/52 (!) 101/59 117/67  Pulse: 80 88 87 81  Resp: 18  16 18   Temp: 98.6 F (37 C) 98.9 F (37.2 C) 98.7 F (37.1 C) 98.4 F (36.9 C)  TempSrc: Oral Oral Oral Oral  SpO2: 97% 98% 98% 96%  Weight:      Height:        General: Not in pain or dyspnea  Neurology: Awake and alert, non focal  E ENT: no pallor, no icterus, oral mucosa moist Cardiovascular: No JVD. S1-S2 present, rhythmic, no gallops, rubs, or murmurs. No lower extremity edema. Pulmonary: positive breath sounds bilaterally, adequate air movement, no wheezing, rhonchi or rales. Gastrointestinal. Abdomen protuberant with no organomegaly, non tender, no rebound or guarding Skin. No rashes Musculoskeletal: no joint deformities   The results of significant diagnostics from this hospitalization (including imaging, microbiology, ancillary and laboratory) are  listed below for reference.     Microbiology: No results found for this or any previous visit (from the past 240 hour(s)).   Labs: BNP (last 3 results) No results for input(s): BNP in the last 8760 hours. Basic Metabolic Panel: Recent Labs  Lab 09/01/18 2119 09/02/18 0720 09/03/18 0449  NA 135 138 139  K 3.4* 4.0 3.9  CL 108 107 109  CO2 21* 21* 24  GLUCOSE 88 116* 134*  BUN 24* 24* 18  CREATININE 1.25* 1.23* 0.91  CALCIUM 9.9 11.3* 11.4*   Liver Function Tests: Recent Labs  Lab 09/01/18 2119 09/02/18 0720  AST 18 18  ALT 15 16  ALKPHOS 46 47   BILITOT 0.6 0.7  PROT 6.2* 6.1*  ALBUMIN 3.1* 3.1*   No results for input(s): LIPASE, AMYLASE in the last 168 hours. No results for input(s): AMMONIA in the last 168 hours. CBC: Recent Labs  Lab 09/01/18 2119 09/02/18 0720 09/03/18 0449  WBC 5.6 5.2 5.5  NEUTROABS 2.5  --  2.0  HGB 7.1* 9.4* 9.4*  HCT 25.0* 33.1* 31.5*  MCV 80.4 81.3 79.9*  PLT 184 199 202   Cardiac Enzymes: Recent Labs  Lab 09/01/18 2119  TROPONINI <0.03   BNP: Invalid input(s): POCBNP CBG: Recent Labs  Lab 09/02/18 0739 09/02/18 1208 09/02/18 1717 09/02/18 2059 09/03/18 0729  GLUCAP 112* 115* 127* 137* 120*   D-Dimer No results for input(s): DDIMER in the last 72 hours. Hgb A1c No results for input(s): HGBA1C in the last 72 hours. Lipid Profile No results for input(s): CHOL, HDL, LDLCALC, TRIG, CHOLHDL, LDLDIRECT in the last 72 hours. Thyroid function studies No results for input(s): TSH, T4TOTAL, T3FREE, THYROIDAB in the last 72 hours.  Invalid input(s): FREET3 Anemia work up Recent Labs    09/01/18 2119 09/03/18 0449  FERRITIN  --  121  TIBC  --  318  IRON  --  36  RETICCTPCT 2.3  --    Urinalysis    Component Value Date/Time   COLORURINE YELLOW 09/01/2018 2205   APPEARANCEUR HAZY (A) 09/01/2018 2205   LABSPEC 1.008 09/01/2018 2205   PHURINE 6.0 09/01/2018 2205   GLUCOSEU NEGATIVE 09/01/2018 2205   HGBUR NEGATIVE 09/01/2018 2205   BILIRUBINUR NEGATIVE 09/01/2018 2205   KETONESUR NEGATIVE 09/01/2018 2205   PROTEINUR NEGATIVE 09/01/2018 2205   UROBILINOGEN 0.2 02/12/2015 1348   NITRITE NEGATIVE 09/01/2018 2205   LEUKOCYTESUR MODERATE (A) 09/01/2018 2205   Sepsis Labs Invalid input(s): PROCALCITONIN,  WBC,  LACTICIDVEN Microbiology No results found for this or any previous visit (from the past 240 hour(s)).   Time coordinating discharge: 45 minutes  SIGNED:   Coralie Keens, MD  [facility] 09/03/2018, 9:48 AM

## 2018-09-04 LAB — GLUCOSE, CAPILLARY
Glucose-Capillary: 134 mg/dL — ABNORMAL HIGH (ref 70–99)
Glucose-Capillary: 204 mg/dL — ABNORMAL HIGH (ref 70–99)
Glucose-Capillary: 159 mg/dL — ABNORMAL HIGH (ref 70–99)
Glucose-Capillary: 145 mg/dL — ABNORMAL HIGH (ref 70–99)
Glucose-Capillary: 161 mg/dL — ABNORMAL HIGH (ref 70–99)

## 2018-09-04 LAB — PTH, INTACT AND CALCIUM
Calcium, Total (PTH): 11.5 mg/dL — ABNORMAL HIGH (ref 8.7–10.2)
PTH: 45 pg/mL (ref 15–65)

## 2018-09-04 MED ORDER — POLYETHYLENE GLYCOL 3350 17 G PO PACK: 17.0000 g | PACK | Freq: Every day | ORAL | 0 refills | Status: AC

## 2018-09-04 NOTE — Progress Notes (Signed)
Patient has remained stable over the last 24 hours, her dizziness has improved.  Head CT negative for acute changes, her intact PTH is within normal ranges.  Will arrange for home physical therapy.  Patient is stable for discharge today.

## 2018-09-04 NOTE — TOC Initial Note (Signed)
Transition of Care [facility]) - Initial/Assessment Note    Patient Details  Name: Monique Holland MRN: [id number] Date of Birth: 15-Jun-1965  Transition of [facility] CM/SW Contact:    Coralyn Helling, LCSW Phone Number: 09/04/2018, 2:48 PM  Clinical Narrative:        Patient will dc home with home health.            Expected Discharge Plan: Home w Home Health Services Barriers to Discharge: Continued Medical Work up   Patient Goals and CMS Choice   CMS Medicare.gov Compare Post Acute Care list provided to:: Patient Choice offered to / list presented to : Patient  Expected Discharge Plan and Services Expected Discharge Plan: Home w Home Health Services     Post Acute Care Choice: Durable Medical Equipment, Home Health Living arrangements for the past 2 months: Single Family Home Expected Discharge Date: 09/04/18               DME Arranged: Dan Humphreys DME Agency: [employer] HH Arranged: PT HH Agency: [facility]  Prior Living Arrangements/Services Living arrangements for the past 2 months: Single Family Home Lives with:: Spouse Patient language and need for interpreter reviewed:: No Do you feel safe going back to the place where you live?: Yes      Need for Family Participation in Patient Care: Yes (Comment) Care giver support system in place?: Yes (comment) Current home services: Home PT Criminal Activity/Legal Involvement Pertinent to Current Situation/Hospitalization: No - Comment as needed  Activities of Daily Living Home Assistive Devices/Equipment: Eyeglasses ADL Screening (condition at time of admission) Patient's cognitive ability adequate to safely complete daily activities?: Yes Is the patient deaf or have difficulty hearing?: No Does the patient have difficulty seeing, even when wearing glasses/contacts?: No Does the patient have difficulty concentrating, remembering, or making decisions?: No Patient able to express need for assistance with ADLs?: Yes Does  the patient have difficulty dressing or bathing?: No Independently performs ADLs?: Yes (appropriate for developmental age) Does the patient have difficulty walking or climbing stairs?: Yes Weakness of Legs: Both Weakness of Arms/Hands: Both  Permission Sought/Granted Permission sought to share information with : Family Supports Permission granted to share information with : Yes, Verbal Permission Granted  Share Information with NAME: Elige Radon     Permission granted to share info w Relationship: Spouse     Emotional Assessment Appearance:: Appears stated age Attitude/Demeanor/Rapport: Engaged Affect (typically observed): Calm Orientation: : Oriented to Self, Oriented to Place, Oriented to  Time, Oriented to Situation Alcohol / Substance Use: Not Applicable Psych Involvement: No (comment)  Admission diagnosis:  Symptomatic anemia [D64.9] Syncope, unspecified syncope type [R55] Patient Active Problem List   Diagnosis Date Noted  . Schizophrenia (HCC) 09/02/2018  . Obesity, Class III, BMI 40-49.9 (morbid obesity) (HCC) 09/02/2018  . Iron deficiency 09/02/2018  . Symptomatic anemia 09/01/2018  . Hypokalemia 09/01/2018  . Severe sepsis (HCC) 08/11/2018  . AKI (acute kidney injury) (HCC) 08/11/2018  . Hypotension 08/11/2018  . Type 2 diabetes mellitus (HCC) 08/11/2018  . Acute appendicitis 05/22/2014  . Ureteral calculus, right 05/02/2013   PCP:  Mikael Spray, NP Pharmacy:   [id number] [place], [place] - [phone] [place] [address] Phone: [phone] Fax: [phone]     Social Determinants of Health (SDOH) Interventions    Readmission Risk Interventions Readmission Risk Prevention Plan 09/04/2018  Transportation Screening Complete  PCP or Specialist Appt within 5-7 Days Complete  Home Care Screening Complete  Medication Review (RN CM) Complete  Some recent data might be hidden

## 2018-09-04 NOTE — Progress Notes (Signed)
Patient has discharged to home on 04/14 /2020. Discharge instruction including medication and appointment was given to patient. Patient has no question at this time.

## 2018-09-04 NOTE — Care Management Important Message (Signed)
Important Message  Patient Details IM Letter given to Case Manager to present to the Patient Name: Monique Holland MRN: [id number] Date of Birth: [birth year]   Medicare Important Message Given:  Yes    Caren Macadam 09/04/2018, 10:52 AMImportant Message  Patient Details  Name: Monique Holland MRN: [id number] Date of Birth: 04/17/1966   Medicare Important Message Given:  Yes    Caren Macadam 09/04/2018, 10:52 AM

## 2018-11-20 ENCOUNTER — Other Ambulatory Visit: Payer: Self-pay | Admitting: Family Medicine

## 2018-11-20 DIAGNOSIS — Z1231 Encounter for screening mammogram for malignant neoplasm of breast: Secondary | ICD-10-CM

## 2018-12-31 ENCOUNTER — Ambulatory Visit: Payer: Self-pay

## 2019-01-11 ENCOUNTER — Ambulatory Visit: Payer: 59

## 2019-03-13 ENCOUNTER — Encounter: Payer: Self-pay | Admitting: Nurse Practitioner

## 2019-03-23 ENCOUNTER — Emergency Department (HOSPITAL_BASED_OUTPATIENT_CLINIC_OR_DEPARTMENT_OTHER): Payer: 59

## 2019-03-23 ENCOUNTER — Encounter (HOSPITAL_BASED_OUTPATIENT_CLINIC_OR_DEPARTMENT_OTHER): Payer: Self-pay | Admitting: Emergency Medicine

## 2019-03-23 ENCOUNTER — Emergency Department (HOSPITAL_BASED_OUTPATIENT_CLINIC_OR_DEPARTMENT_OTHER)
Admission: EM | Admit: 2019-03-23 | Discharge: 2019-03-23 | Disposition: A | Discharge status: Home / Self Care | Payer: 59 | Attending: Emergency Medicine | Admitting: Emergency Medicine

## 2019-03-23 ENCOUNTER — Other Ambulatory Visit: Payer: Self-pay

## 2019-03-23 DIAGNOSIS — Z7984 Long term (current) use of oral hypoglycemic drugs: Secondary | ICD-10-CM | POA: Insufficient documentation

## 2019-03-23 DIAGNOSIS — Z79899 Other long term (current) drug therapy: Secondary | ICD-10-CM | POA: Diagnosis not present

## 2019-03-23 DIAGNOSIS — Z20828 Contact with and (suspected) exposure to other viral communicable diseases: Secondary | ICD-10-CM | POA: Diagnosis not present

## 2019-03-23 DIAGNOSIS — R531 Weakness: Secondary | ICD-10-CM | POA: Diagnosis present

## 2019-03-23 DIAGNOSIS — E119 Type 2 diabetes mellitus without complications: Secondary | ICD-10-CM | POA: Diagnosis not present

## 2019-03-23 DIAGNOSIS — I10 Essential (primary) hypertension: Secondary | ICD-10-CM | POA: Insufficient documentation

## 2019-03-23 LAB — URINALYSIS, ROUTINE W REFLEX MICROSCOPIC
Bilirubin Urine: NEGATIVE
Glucose, UA: NEGATIVE mg/dL
Hgb urine dipstick: NEGATIVE
Ketones, ur: NEGATIVE mg/dL
Nitrite: POSITIVE — AB
Protein, ur: NEGATIVE mg/dL
Specific Gravity, Urine: 1.015 (ref 1.005–1.030)
pH: 6.5 (ref 5.0–8.0)

## 2019-03-23 LAB — CBC WITH DIFFERENTIAL/PLATELET
Abs Immature Granulocytes: 0.02 10*3/uL (ref 0.00–0.07)
Basophils Absolute: 0 10*3/uL (ref 0.0–0.1)
Basophils Relative: 1 %
Eosinophils Absolute: 0.1 10*3/uL (ref 0.0–0.5)
Eosinophils Relative: 3 %
HCT: 37.2 % (ref 36.0–46.0)
Hemoglobin: 10.5 g/dL — ABNORMAL LOW (ref 12.0–15.0)
Immature Granulocytes: 0 %
Lymphocytes Relative: 40 %
Lymphs Abs: 2.1 10*3/uL (ref 0.7–4.0)
MCH: 22.6 pg — ABNORMAL LOW (ref 26.0–34.0)
MCHC: 28.2 g/dL — ABNORMAL LOW (ref 30.0–36.0)
MCV: 80.2 fL (ref 80.0–100.0)
Monocytes Absolute: 0.5 10*3/uL (ref 0.1–1.0)
Monocytes Relative: 10 %
Neutro Abs: 2.4 10*3/uL (ref 1.7–7.7)
Neutrophils Relative %: 46 %
Platelets: 261 10*3/uL (ref 150–400)
RBC: 4.64 MIL/uL (ref 3.87–5.11)
RDW: 14.2 % (ref 11.5–15.5)
WBC: 5.2 10*3/uL (ref 4.0–10.5)
nRBC: 0 % (ref 0.0–0.2)

## 2019-03-23 LAB — COMPREHENSIVE METABOLIC PANEL
ALT: 14 U/L (ref 0–44)
AST: 20 U/L (ref 15–41)
Albumin: 3.9 g/dL (ref 3.5–5.0)
Alkaline Phosphatase: 51 U/L (ref 38–126)
Anion gap: 10 (ref 5–15)
BUN: 12 mg/dL (ref 6–20)
CO2: 23 mmol/L (ref 22–32)
Calcium: 11.1 mg/dL — ABNORMAL HIGH (ref 8.9–10.3)
Chloride: 106 mmol/L (ref 98–111)
Creatinine, Ser: 1.05 mg/dL — ABNORMAL HIGH (ref 0.44–1.00)
GFR calc Af Amer: >60 mL/min (ref >60)
GFR calc non Af Amer: >60 mL/min (ref >60)
Glucose, Bld: 158 mg/dL — ABNORMAL HIGH (ref 70–99)
Potassium: 3.7 mmol/L (ref 3.5–5.1)
Sodium: 139 mmol/L (ref 135–145)
Total Bilirubin: 0.5 mg/dL (ref 0.3–1.2)
Total Protein: 7.2 g/dL (ref 6.5–8.1)

## 2019-03-23 LAB — URINALYSIS, MICROSCOPIC (REFLEX)

## 2019-03-23 IMAGING — DX DG CHEST 1V PORT
1 series · 1 of 1 positions shown · non-contrast
Comparison: [DATE]

CLINICAL DATA: Fever and generalized weakness for the past 5 days.

EXAM:
PORTABLE CHEST 1 VIEW

[chest ap]
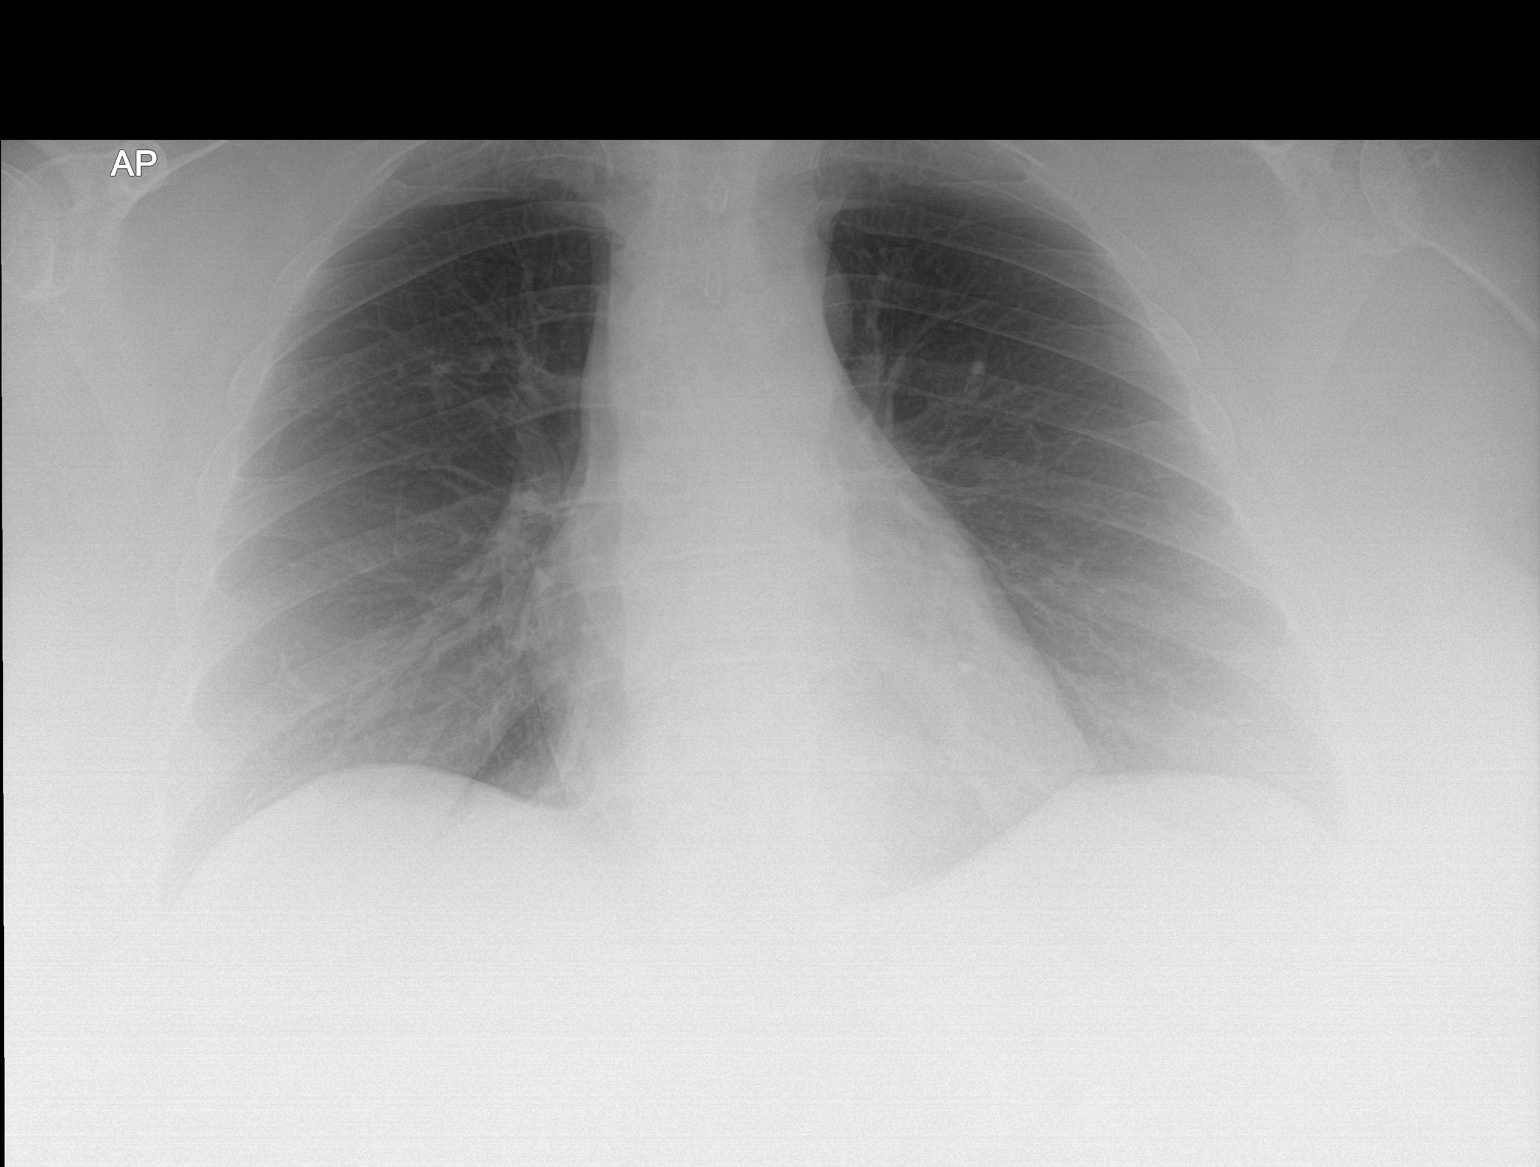

[1 of 1 positions shown; findings below may reference images not displayed]

FINDINGS: The heart size and mediastinal contours are within normal limits.
Both lungs are clear. The visualized skeletal structures are
unremarkable.
IMPRESSION: Normal examination.

## 2019-03-23 MED ORDER — SODIUM CHLORIDE 0.9 % IV BOLUS
1000.0000 mL | Freq: Once | INTRAVENOUS | Status: AC
  Administered 2019-03-23: 1000 mL via INTRAVENOUS

## 2019-03-23 MED ORDER — FOSFOMYCIN TROMETHAMINE 3 G PO PACK: 3.0000 g | PACK | Freq: Once | ORAL | Status: DC

## 2019-03-23 MED ORDER — CEPHALEXIN 250 MG PO CAPS
500.0000 mg | ORAL_CAPSULE | Freq: Once | ORAL | Status: AC
  Administered 2019-03-23: 13:00:00 500 mg via ORAL
  Filled 2019-03-23: qty 2

## 2019-03-23 MED ORDER — ACETAMINOPHEN 500 MG PO TABS
1000.0000 mg | ORAL_TABLET | Freq: Once | ORAL | Status: AC
  Administered 2019-03-23: 1000 mg via ORAL
  Filled 2019-03-23: qty 2

## 2019-03-23 MED ORDER — CEPHALEXIN 500 MG PO CAPS: 500.0000 mg | ORAL_CAPSULE | Freq: Four times a day (QID) | ORAL | 0 refills | Status: DC

## 2019-03-23 NOTE — ED Provider Notes (Signed)
[facility] EMERGENCY DEPARTMENT Provider Note   CSN: [id number] Arrival date & time: 03/23/19  5621     History   Chief Complaint Chief Complaint  Patient presents with  . Weakness    HPI Tene Gato is a 53 y.o. female.     53 yo F with a chief complaints of fatigue.  Going on for about 5 days.  Denies any other significant symptoms.  States that it feels like when she was anemic and was admitted back in April.  She feels that her stool has been dark.  She denies any iron use.  Denies abdominal pain denies vomiting denies diarrhea denies decreased oral intake denies cough congestion or fever.  The history is provided by the patient.  Weakness Severity:  Moderate Onset quality:  Sudden Duration:  2 days Timing:  Constant Progression:  Worsening Chronicity:  New Relieved by:  Nothing Worsened by:  Nothing Ineffective treatments:  None tried Associated symptoms: no arthralgias, no chest pain, no cough, no dizziness, no dysuria, no fever, no headaches, no myalgias, no nausea, no shortness of breath, no urgency and no vomiting     Past Medical History:  Diagnosis Date  . Anxiety   . Asthma   . Depression   . Diabetes mellitus without complication (HCC)   . Frequency of urination   . GERD (gastroesophageal reflux disease)   . History of asthma    last episode yrs ago  . Hypertension   . OSA (obstructive sleep apnea)    pt had study done oct 2014--  schedule for cpap titrate after kidney stone surgery  . Schizophrenia (HCC)   . Ureteral calculi    BILATERAL  . Wears glasses     Patient Active Problem List   Diagnosis Date Noted  . Schizophrenia (HCC) 09/02/2018  . Obesity, Class III, BMI 40-49.9 (morbid obesity) (HCC) 09/02/2018  . Iron deficiency 09/02/2018  . Symptomatic anemia 09/01/2018  . Hypokalemia 09/01/2018  . Severe sepsis (HCC) 08/11/2018  . AKI (acute kidney injury) (HCC) 08/11/2018  . Hypotension 08/11/2018  . Type 2 diabetes  mellitus (HCC) 08/11/2018  . Acute appendicitis 05/22/2014  . Ureteral calculus, right 05/02/2013    Past Surgical History:  Procedure Laterality Date  . CESAREAN SECTION  1991  &  2002   w/ bilateral tubal ligation in 2002  . CYSTOSCOPY W/ URETERAL STENT PLACEMENT Right 08/10/2018   Procedure: CYSTOSCOPY WITH RETROGRADE PYELOGRAM/URETERAL STENT PLACEMENT;  Surgeon: Jerilee Field, MD;  Location: [facility];  Service: Urology;  Laterality: Right;  . CYSTOSCOPY WITH RETROGRADE PYELOGRAM, URETEROSCOPY AND STENT PLACEMENT Bilateral 05/02/2013   Procedure: CYSTOSCOPY WITH RETROGRADE PYELOGRAM, URETEROSCOPY ;  Surgeon: Valetta Fuller, MD;  Location: [facility];  Service: Urology;  Laterality: Bilateral;  . CYSTOSCOPY WITH RETROGRADE PYELOGRAM, URETEROSCOPY AND STENT PLACEMENT Right 08/15/2018   Procedure: CYSTOSCOPY WITH RIGHT RETROGRADE PYELOGRAM, RIGHT URETEROSCOPY WITH HOLMIUM LASER AND STENT PLACEMENT;  Surgeon: Crista Elliot, MD;  Location: [facility];  Service: Urology;  Laterality: Right;  . CYSTOSCOPY WITH STENT PLACEMENT Left 05/02/2013   Procedure: CYSTOSCOPY WITH STENT PLACEMENT;  Surgeon: Valetta Fuller, MD;  Location: [facility];  Service: Urology;  Laterality: Left;  . HOLMIUM LASER APPLICATION Bilateral 05/02/2013   Procedure: HOLMIUM LASER APPLICATION;  Surgeon: Valetta Fuller, MD;  Location: [facility];  Service: Urology;  Laterality: Bilateral;  . LAPAROSCOPIC APPENDECTOMY N/A 05/22/2014   Procedure: APPENDECTOMY LAPAROSCOPIC;  Surgeon: Glenna Fellows,  MD;  Location: [facility];  Service: General;  Laterality: N/A;  . TONSILLECTOMY  age 53     OB History   No obstetric history on file.      Home Medications    Prior to Admission medications   Medication Sig Start Date End Date Taking? Authorizing Provider  albuterol (VENTOLIN HFA) 108 (90 Base) MCG/ACT inhaler Inhale 1-2 puffs into the lungs every 6 (six) hours as needed for  wheezing or shortness of breath.    [provider]  amitriptyline (ELAVIL) 50 MG tablet Take 50 mg by mouth at bedtime.    [provider]  buPROPion (WELLBUTRIN) 100 MG tablet Take 100 mg by mouth 2 (two) times daily.    [provider]  cephALEXin (KEFLEX) 500 MG capsule Take 500 mg by mouth 2 (two) times daily.    [provider]  diphenhydrAMINE HCl, Sleep, (ZZZQUIL) 25 MG CAPS Take 25 mg by mouth at bedtime as needed (sleep).    [provider]  glimepiride (AMARYL) 2 MG tablet Take 2 mg by mouth daily with breakfast.    [provider]  lurasidone (LATUDA) 40 MG TABS tablet Take 60 mg by mouth at bedtime.    [provider]  metFORMIN (GLUCOPHAGE) 1000 MG tablet Take 1,000 mg by mouth 2 (two) times daily with a meal.    [provider]  OLANZapine (ZYPREXA) 10 MG tablet Take 10 mg by mouth at bedtime.    [provider]  omeprazole (PRILOSEC) 40 MG capsule Take 40 mg by mouth daily after breakfast.    [provider]  ondansetron (ZOFRAN ODT) 4 MG disintegrating tablet Take 1 tablet (4 mg total) by mouth every 8 (eight) hours as needed for nausea or vomiting. 08/04/18   Robinson, [place] N, PA-C  sertraline (ZOLOFT) 100 MG tablet Take 100 mg by mouth daily.    [provider]  sitaGLIPtin (JANUVIA) 100 MG tablet Take 100 mg by mouth daily.    [provider]  tamsulosin (FLOMAX) 0.4 MG CAPS capsule Take 0.4 mg by mouth daily after breakfast.    [provider]  traZODone (DESYREL) 100 MG tablet Take 100 mg by mouth at bedtime.    [provider]    Family History No family history on file.  Social History Social History   Tobacco Use  . Smoking status: Never Smoker  . Smokeless tobacco: Never Used  Substance Use Topics  . Alcohol use: No  . Drug use: No     Allergies   Patient has no known allergies.   Review of Systems Review of Systems   Constitutional: Negative for chills and fever.  HENT: Negative for congestion and rhinorrhea.   Eyes: Negative for redness and visual disturbance.  Respiratory: Negative for cough, shortness of breath and wheezing.   Cardiovascular: Negative for chest pain and palpitations.  Gastrointestinal: Negative for nausea and vomiting.  Genitourinary: Negative for dysuria and urgency.  Musculoskeletal: Negative for arthralgias and myalgias.  Skin: Negative for pallor and wound.  Neurological: Positive for weakness. Negative for dizziness and headaches.     Physical Exam Updated Vital Signs BP 138/84   Pulse 86   Temp 98.4 F (36.9 C) (Oral)   Resp 16   Ht 5\' 9"  (1.753 m)   Wt (!) 145.2 kg   LMP 03/23/2014 Comment: patient states her periods are irregular  SpO2 99%   BMI 47.26 kg/m   Physical Exam Vitals signs and nursing note  reviewed.  Constitutional:      General: She is not in acute distress.    Appearance: She is well-developed. She is obese. She is not diaphoretic.  HENT:     Head: Normocephalic and atraumatic.  Eyes:     Pupils: Pupils are equal, round, and reactive to light.  Neck:     Musculoskeletal: Normal range of motion and neck supple.  Cardiovascular:     Rate and Rhythm: Regular rhythm. Tachycardia present.     Heart sounds: No murmur. No friction rub. No gallop.   Pulmonary:     Effort: Pulmonary effort is normal.     Breath sounds: No wheezing or rales.  Abdominal:     General: There is no distension.     Palpations: Abdomen is soft.     Tenderness: There is no abdominal tenderness.  Musculoskeletal:        General: No tenderness.  Skin:    General: Skin is warm and dry.  Neurological:     Mental Status: She is alert and oriented to person, place, and time.  Psychiatric:        Behavior: Behavior normal.      ED Treatments / Results  Labs (all labs ordered are listed, but only abnormal results are displayed) Labs Reviewed  CBC WITH  DIFFERENTIAL/PLATELET - Abnormal; Notable for the following components:      Result Value   Hemoglobin 10.5 (*)    MCH 22.6 (*)    MCHC 28.2 (*)    All other components within normal limits  COMPREHENSIVE METABOLIC PANEL - Abnormal; Notable for the following components:   Glucose, Bld 158 (*)    Creatinine, Ser 1.05 (*)    Calcium 11.1 (*)    All other components within normal limits  URINALYSIS, ROUTINE W REFLEX MICROSCOPIC - Abnormal; Notable for the following components:   APPearance CLOUDY (*)    Nitrite POSITIVE (*)    Leukocytes,Ua MODERATE (*)    All other components within normal limits  URINALYSIS, MICROSCOPIC (REFLEX) - Abnormal; Notable for the following components:   Bacteria, UA MANY (*)    All other components within normal limits  CULTURE, BLOOD (ROUTINE X 2)  CULTURE, BLOOD (ROUTINE X 2)  NOVEL CORONAVIRUS, NAA (HOSP ORDER, SEND-OUT TO REF LAB; TAT 18-24 HRS)    EKG None  Radiology Dg Chest Port 1 View  Result Date: 03/23/2019 CLINICAL DATA:  Fever and generalized weakness for the past 5 days. EXAM: PORTABLE CHEST 1 VIEW COMPARISON:  09/01/2018 FINDINGS: The heart size and mediastinal contours are within normal limits. Both lungs are clear. The visualized skeletal structures are unremarkable. IMPRESSION: Normal examination. Electronically Signed   By: Beckie Salts M.D.   On: 03/23/2019 10:02    Procedures Procedures (including critical care time)  Medications Ordered in ED Medications  cephALEXin (KEFLEX) capsule 500 mg (has no administration in time range)  sodium chloride 0.9 % bolus 1,000 mL (0 mLs Intravenous Stopped 03/23/19 1126)  acetaminophen (TYLENOL) tablet 1,000 mg (1,000 mg Oral Given 03/23/19 1015)     Initial Impression / Assessment and Plan / ED Course  I have reviewed the triage vital signs and the nursing notes.  Pertinent labs & imaging results that were available during my care of the patient were reviewed by me and considered in my  medical decision making (see chart for details).        53 yo F with a chief complaints of fatigue.  Going on for about  5 days now.  Patient is tachycardic and borderline febrile here.  She denies having a fever at home.  We will screen for infection as the patient has mental illness which could limit her ability to provide significant history or may have her present atypically.  Obtain a chest x-ray and urine.  Obtain lab work to evaluate for anemia or electrolyte abnormality.  Give a bolus of IV fluids and reassess.  Patient is feeling better on reassessment.  Her urine does look like it could be infected.  There are 6-10 squames and she has no urinary symptoms.  Will start on Keflex.  She is anemic but much higher than her past baseline.  Chest x-ray is viewed by me without focal infiltrate.  PCP follow-up.  As this is occurring during the novel coronavirus pandemic and her urine is not obviously infected we will have her self isolate.  Send an outpatient test.  Annelise Mccoy was evaluated in Emergency Department on 03/23/2019 for the symptoms described in the history of present illness. He/she was evaluated in the context of the global COVID-19 pandemic, which necessitated consideration that the patient might be at risk for infection with the SARS-CoV-2 virus that causes COVID-19. Institutional protocols and algorithms that pertain to the evaluation of patients at risk for COVID-19 are in a state of rapid change based on information released by regulatory bodies including the CDC and federal and state organizations. These policies and algorithms were followed during the patient's care in the ED.   12:08 PM:  I have discussed the diagnosis/risks/treatment options with the patient and believe the pt to be eligible for discharge home to follow-up with PCP. We also discussed returning to the ED immediately if new or worsening sx occur. We discussed the sx which are most concerning (e.g., sudden  worsening pain, fever, inability to tolerate by mouth) that necessitate immediate return. Medications administered to the patient during their visit and any new prescriptions provided to the patient are listed below.  Medications given during this visit Medications  cephALEXin (KEFLEX) capsule 500 mg (has no administration in time range)  sodium chloride 0.9 % bolus 1,000 mL (0 mLs Intravenous Stopped 03/23/19 1126)  acetaminophen (TYLENOL) tablet 1,000 mg (1,000 mg Oral Given 03/23/19 1015)     The patient appears reasonably screen and/or stabilized for discharge and I doubt any other medical condition or other [facility] requiring further screening, evaluation, or treatment in the ED at this time prior to discharge.    Final Clinical Impressions(s) / ED Diagnoses   Final diagnoses:  Weakness    ED Discharge Orders    None       Deno Etienne, DO 03/23/19 1208

## 2019-03-23 NOTE — ED Notes (Signed)
ED Provider at bedside. 

## 2019-03-23 NOTE — Discharge Instructions (Signed)
I am unsure of the exact cause of your symptoms.  Your urine looks like it could be infected and so we will start you on antibiotics.  Please take them as prescribed.  Please return for any worsening.  There is also concern that you could have the novel coronavirus as you are feeling weak during the coronavirus pandemic.  If you have worsening shortness of breath this is a reasons to return to the ED for evaluation.  Please call your family doctor and discuss your visit here and see when they want to see you in the office.

## 2019-03-23 NOTE — ED Triage Notes (Signed)
Generalized weakness x 5 days. States it feels the same as when she needed a blood transfusion 3 months ago. Denies recent illness, chest pain, SOB

## 2019-03-25 LAB — NOVEL CORONAVIRUS, NAA (HOSP ORDER, SEND-OUT TO REF LAB; TAT 18-24 HRS): SARS-CoV-2, NAA: NOT DETECTED

## 2019-03-28 LAB — CULTURE, BLOOD (ROUTINE X 2)
Culture: NO GROWTH
Culture: NO GROWTH
Special Requests: ADEQUATE
Special Requests: ADEQUATE

## 2019-04-24 ENCOUNTER — Ambulatory Visit: Payer: 59

## 2019-06-05 ENCOUNTER — Encounter: Payer: Self-pay | Admitting: Nurse Practitioner

## 2019-07-17 ENCOUNTER — Emergency Department (HOSPITAL_BASED_OUTPATIENT_CLINIC_OR_DEPARTMENT_OTHER): Payer: 59

## 2019-07-17 ENCOUNTER — Encounter (HOSPITAL_BASED_OUTPATIENT_CLINIC_OR_DEPARTMENT_OTHER): Payer: Self-pay | Admitting: Emergency Medicine

## 2019-07-17 ENCOUNTER — Inpatient Hospital Stay (HOSPITAL_BASED_OUTPATIENT_CLINIC_OR_DEPARTMENT_OTHER)
Admission: EM | Admit: 2019-07-17 | Discharge: 2019-07-21 | DRG: 377 | Disposition: A | Discharge status: Home / Self Care | Payer: 59 | Attending: Internal Medicine | Admitting: Internal Medicine

## 2019-07-17 ENCOUNTER — Other Ambulatory Visit: Payer: Self-pay

## 2019-07-17 DIAGNOSIS — Z87442 Personal history of urinary calculi: Secondary | ICD-10-CM | POA: Diagnosis not present

## 2019-07-17 DIAGNOSIS — D509 Iron deficiency anemia, unspecified: Secondary | ICD-10-CM | POA: Diagnosis present

## 2019-07-17 DIAGNOSIS — K2991 Gastroduodenitis, unspecified, with bleeding: Secondary | ICD-10-CM | POA: Diagnosis present

## 2019-07-17 DIAGNOSIS — I10 Essential (primary) hypertension: Secondary | ICD-10-CM | POA: Diagnosis present

## 2019-07-17 DIAGNOSIS — Z79899 Other long term (current) drug therapy: Secondary | ICD-10-CM

## 2019-07-17 DIAGNOSIS — K863 Pseudocyst of pancreas: Secondary | ICD-10-CM | POA: Diagnosis present

## 2019-07-17 DIAGNOSIS — K2951 Unspecified chronic gastritis with bleeding: Principal | ICD-10-CM | POA: Diagnosis present

## 2019-07-17 DIAGNOSIS — K922 Gastrointestinal hemorrhage, unspecified: Secondary | ICD-10-CM | POA: Diagnosis not present

## 2019-07-17 DIAGNOSIS — E041 Nontoxic single thyroid nodule: Secondary | ICD-10-CM | POA: Diagnosis present

## 2019-07-17 DIAGNOSIS — K2101 Gastro-esophageal reflux disease with esophagitis, with bleeding: Secondary | ICD-10-CM | POA: Diagnosis present

## 2019-07-17 DIAGNOSIS — K8501 Idiopathic acute pancreatitis with uninfected necrosis: Secondary | ICD-10-CM | POA: Diagnosis present

## 2019-07-17 DIAGNOSIS — B3781 Candidal esophagitis: Secondary | ICD-10-CM | POA: Diagnosis present

## 2019-07-17 DIAGNOSIS — F419 Anxiety disorder, unspecified: Secondary | ICD-10-CM | POA: Diagnosis present

## 2019-07-17 DIAGNOSIS — F209 Schizophrenia, unspecified: Secondary | ICD-10-CM | POA: Diagnosis present

## 2019-07-17 DIAGNOSIS — Z6841 Body Mass Index (BMI) 40.0 and over, adult: Secondary | ICD-10-CM | POA: Diagnosis not present

## 2019-07-17 DIAGNOSIS — Z20822 Contact with and (suspected) exposure to covid-19: Secondary | ICD-10-CM | POA: Diagnosis present

## 2019-07-17 DIAGNOSIS — R739 Hyperglycemia, unspecified: Secondary | ICD-10-CM

## 2019-07-17 DIAGNOSIS — K228 Other specified diseases of esophagus: Secondary | ICD-10-CM | POA: Diagnosis not present

## 2019-07-17 DIAGNOSIS — R1010 Upper abdominal pain, unspecified: Secondary | ICD-10-CM | POA: Diagnosis not present

## 2019-07-17 DIAGNOSIS — K2990 Gastroduodenitis, unspecified, without bleeding: Secondary | ICD-10-CM | POA: Diagnosis not present

## 2019-07-17 DIAGNOSIS — Z934 Other artificial openings of gastrointestinal tract status: Secondary | ICD-10-CM

## 2019-07-17 DIAGNOSIS — J4520 Mild intermittent asthma, uncomplicated: Secondary | ICD-10-CM | POA: Diagnosis present

## 2019-07-17 DIAGNOSIS — K760 Fatty (change of) liver, not elsewhere classified: Secondary | ICD-10-CM | POA: Diagnosis present

## 2019-07-17 DIAGNOSIS — K8500 Idiopathic acute pancreatitis without necrosis or infection: Secondary | ICD-10-CM | POA: Diagnosis not present

## 2019-07-17 DIAGNOSIS — Z794 Long term (current) use of insulin: Secondary | ICD-10-CM

## 2019-07-17 DIAGNOSIS — Z9851 Tubal ligation status: Secondary | ICD-10-CM | POA: Diagnosis not present

## 2019-07-17 DIAGNOSIS — Z86718 Personal history of other venous thrombosis and embolism: Secondary | ICD-10-CM

## 2019-07-17 DIAGNOSIS — Z96659 Presence of unspecified artificial knee joint: Secondary | ICD-10-CM | POA: Diagnosis present

## 2019-07-17 DIAGNOSIS — K2970 Gastritis, unspecified, without bleeding: Secondary | ICD-10-CM

## 2019-07-17 DIAGNOSIS — D638 Anemia in other chronic diseases classified elsewhere: Secondary | ICD-10-CM | POA: Diagnosis present

## 2019-07-17 DIAGNOSIS — E119 Type 2 diabetes mellitus without complications: Secondary | ICD-10-CM | POA: Diagnosis present

## 2019-07-17 DIAGNOSIS — E210 Primary hyperparathyroidism: Secondary | ICD-10-CM | POA: Diagnosis present

## 2019-07-17 DIAGNOSIS — R112 Nausea with vomiting, unspecified: Secondary | ICD-10-CM | POA: Diagnosis not present

## 2019-07-17 DIAGNOSIS — F319 Bipolar disorder, unspecified: Secondary | ICD-10-CM | POA: Diagnosis present

## 2019-07-17 DIAGNOSIS — Z8249 Family history of ischemic heart disease and other diseases of the circulatory system: Secondary | ICD-10-CM

## 2019-07-17 DIAGNOSIS — E892 Postprocedural hypoparathyroidism: Secondary | ICD-10-CM | POA: Diagnosis present

## 2019-07-17 DIAGNOSIS — K920 Hematemesis: Secondary | ICD-10-CM | POA: Diagnosis present

## 2019-07-17 DIAGNOSIS — Z833 Family history of diabetes mellitus: Secondary | ICD-10-CM

## 2019-07-17 DIAGNOSIS — K859 Acute pancreatitis without necrosis or infection, unspecified: Secondary | ICD-10-CM | POA: Diagnosis present

## 2019-07-17 DIAGNOSIS — K860 Alcohol-induced chronic pancreatitis: Secondary | ICD-10-CM | POA: Diagnosis not present

## 2019-07-17 DIAGNOSIS — F10929 Alcohol use, unspecified with intoxication, unspecified: Secondary | ICD-10-CM | POA: Diagnosis not present

## 2019-07-17 DIAGNOSIS — R9431 Abnormal electrocardiogram [ECG] [EKG]: Secondary | ICD-10-CM | POA: Diagnosis present

## 2019-07-17 DIAGNOSIS — Z9049 Acquired absence of other specified parts of digestive tract: Secondary | ICD-10-CM

## 2019-07-17 DIAGNOSIS — G4733 Obstructive sleep apnea (adult) (pediatric): Secondary | ICD-10-CM | POA: Diagnosis present

## 2019-07-17 DIAGNOSIS — K8591 Acute pancreatitis with uninfected necrosis, unspecified: Secondary | ICD-10-CM | POA: Diagnosis not present

## 2019-07-17 DIAGNOSIS — Z7951 Long term (current) use of inhaled steroids: Secondary | ICD-10-CM

## 2019-07-17 HISTORY — DX: Acute pancreatitis without necrosis or infection, unspecified: K85.90

## 2019-07-17 LAB — CBC WITH DIFFERENTIAL/PLATELET
Abs Immature Granulocytes: 0.01 10*3/uL (ref 0.00–0.07)
Basophils Absolute: 0 10*3/uL (ref 0.0–0.1)
Basophils Relative: 1 %
Eosinophils Absolute: 0.4 10*3/uL (ref 0.0–0.5)
Eosinophils Relative: 7 %
HCT: 31.2 % — ABNORMAL LOW (ref 36.0–46.0)
Hemoglobin: 9.2 g/dL — ABNORMAL LOW (ref 12.0–15.0)
Immature Granulocytes: 0 %
Lymphocytes Relative: 27 %
Lymphs Abs: 1.5 10*3/uL (ref 0.7–4.0)
MCH: 22.7 pg — ABNORMAL LOW (ref 26.0–34.0)
MCHC: 29.5 g/dL — ABNORMAL LOW (ref 30.0–36.0)
MCV: 76.8 fL — ABNORMAL LOW (ref 80.0–100.0)
Monocytes Absolute: 0.6 10*3/uL (ref 0.1–1.0)
Monocytes Relative: 11 %
Neutro Abs: 2.9 10*3/uL (ref 1.7–7.7)
Neutrophils Relative %: 54 %
Platelets: 409 10*3/uL — ABNORMAL HIGH (ref 150–400)
RBC: 4.06 MIL/uL (ref 3.87–5.11)
RDW: 15.8 % — ABNORMAL HIGH (ref 11.5–15.5)
WBC: 5.4 10*3/uL (ref 4.0–10.5)
nRBC: 0 % (ref 0.0–0.2)

## 2019-07-17 LAB — COMPREHENSIVE METABOLIC PANEL
ALT: 15 U/L (ref 0–44)
AST: 24 U/L (ref 15–41)
Albumin: 3.6 g/dL (ref 3.5–5.0)
Alkaline Phosphatase: 100 U/L (ref 38–126)
Anion gap: 12 (ref 5–15)
BUN: 23 mg/dL — ABNORMAL HIGH (ref 6–20)
CO2: 23 mmol/L (ref 22–32)
Calcium: 9.4 mg/dL (ref 8.9–10.3)
Chloride: 94 mmol/L — ABNORMAL LOW (ref 98–111)
Creatinine, Ser: 1 mg/dL (ref 0.44–1.00)
GFR calc Af Amer: >60 mL/min (ref >60)
GFR calc non Af Amer: >60 mL/min (ref >60)
Glucose, Bld: 581 mg/dL (ref 70–99)
Potassium: 4.9 mmol/L (ref 3.5–5.1)
Sodium: 129 mmol/L — ABNORMAL LOW (ref 135–145)
Total Bilirubin: 0.1 mg/dL — ABNORMAL LOW (ref 0.3–1.2)
Total Protein: 8 g/dL (ref 6.5–8.1)

## 2019-07-17 LAB — HEMOGLOBIN AND HEMATOCRIT, BLOOD
HCT: 30.1 % — ABNORMAL LOW (ref 36.0–46.0)
HCT: 29 % — ABNORMAL LOW (ref 36.0–46.0)
Hemoglobin: 8.5 g/dL — ABNORMAL LOW (ref 12.0–15.0)
Hemoglobin: 8.4 g/dL — ABNORMAL LOW (ref 12.0–15.0)

## 2019-07-17 LAB — HEMOGLOBIN A1C
Hgb A1c MFr Bld: 7.2 % — ABNORMAL HIGH (ref 4.8–5.6)
Mean Plasma Glucose: 159.94 mg/dL

## 2019-07-17 LAB — URINALYSIS, ROUTINE W REFLEX MICROSCOPIC
Bilirubin Urine: NEGATIVE
Glucose, UA: >=500 mg/dL — AB
Hgb urine dipstick: NEGATIVE
Ketones, ur: NEGATIVE mg/dL
Leukocytes,Ua: NEGATIVE
Nitrite: NEGATIVE
Protein, ur: NEGATIVE mg/dL
Specific Gravity, Urine: 1.01 (ref 1.005–1.030)
pH: 7 (ref 5.0–8.0)

## 2019-07-17 LAB — CBG MONITORING, ED
Glucose-Capillary: 331 mg/dL — ABNORMAL HIGH (ref 70–99)
Glucose-Capillary: 200 mg/dL — ABNORMAL HIGH (ref 70–99)

## 2019-07-17 LAB — PROTIME-INR
INR: 1 (ref 0.8–1.2)
Prothrombin Time: 12.8 seconds (ref 11.4–15.2)

## 2019-07-17 LAB — LIPASE, BLOOD: Lipase: 91 U/L — ABNORMAL HIGH (ref 11–51)

## 2019-07-17 LAB — GLUCOSE, CAPILLARY
Glucose-Capillary: 187 mg/dL — ABNORMAL HIGH (ref 70–99)
Glucose-Capillary: 232 mg/dL — ABNORMAL HIGH (ref 70–99)
Glucose-Capillary: 212 mg/dL — ABNORMAL HIGH (ref 70–99)

## 2019-07-17 LAB — URINALYSIS, MICROSCOPIC (REFLEX)

## 2019-07-17 LAB — SARS CORONAVIRUS 2 AG (30 MIN TAT): SARS Coronavirus 2 Ag: NEGATIVE

## 2019-07-17 LAB — RESPIRATORY PANEL BY RT PCR (FLU A&B, COVID)
Influenza A by PCR: NEGATIVE
Influenza B by PCR: NEGATIVE
SARS Coronavirus 2 by RT PCR: NEGATIVE

## 2019-07-17 LAB — MAGNESIUM: Magnesium: 1.9 mg/dL (ref 1.7–2.4)

## 2019-07-17 IMAGING — CT CT ABD-PELV W/ CM
2 of 5 series · 11 of 46 positions shown, 12 images · IV contrast (Omnipaque)
Comparison: CT chest [DATE] and CT abdomen pelvis performed
[DATE] at outside facility (report only)
COMPARISON: CT chest [DATE] and CT abdomen pelvis performed
[DATE] at outside facility (report only)

Addendum:
CLINICAL DATA: Generalized abdominal pain, recent hospitalization
to [facility] for necrotizing pancreatitis.

EXAM:
CT ABDOMEN AND PELVIS WITH CONTRAST
TECHNIQUE: Multidetector CT imaging of the abdomen and pelvis was performed
using the standard protocol following bolus administration of
intravenous contrast.
CONTRAST:  125mL OMNIPAQUE IOHEXOL 300 MG/ML  SOLN

[Series 2: axial st · axial · 0.93mm/px · z∈[-496,-72]mm · 8 of 109 slices shown, 9 images]
[im 12/109  soft-tissue]
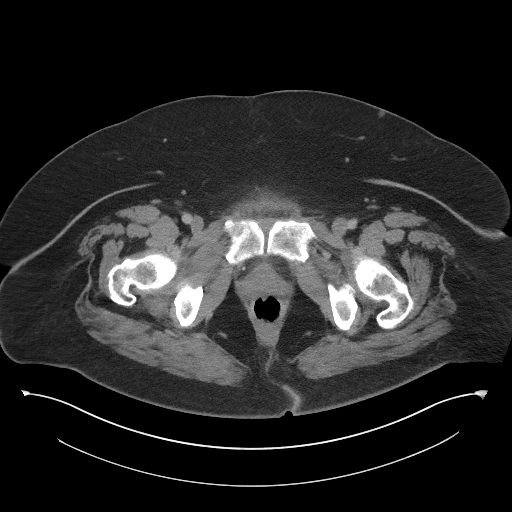
[im 12/109  bone]
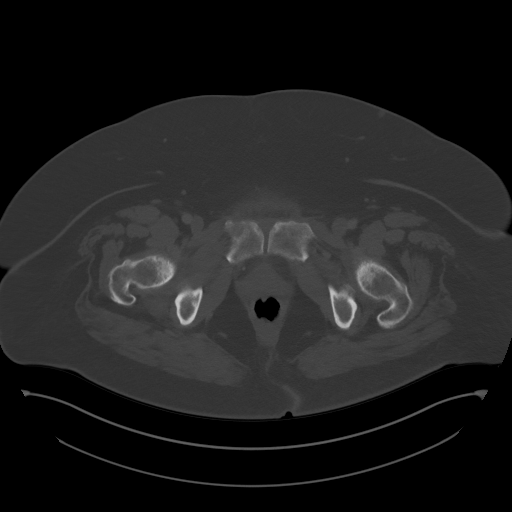
[im 23/109  soft-tissue]
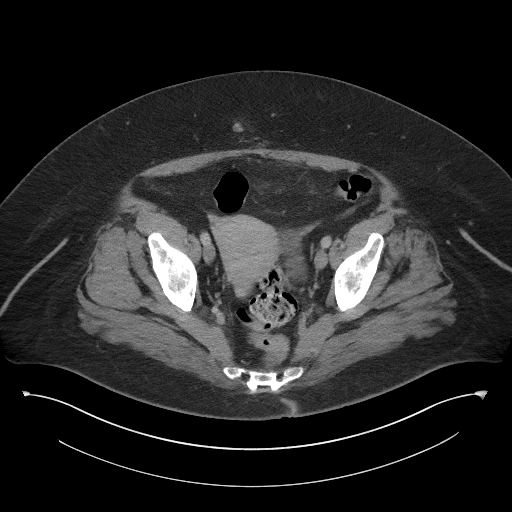
[im 35/109  soft-tissue]
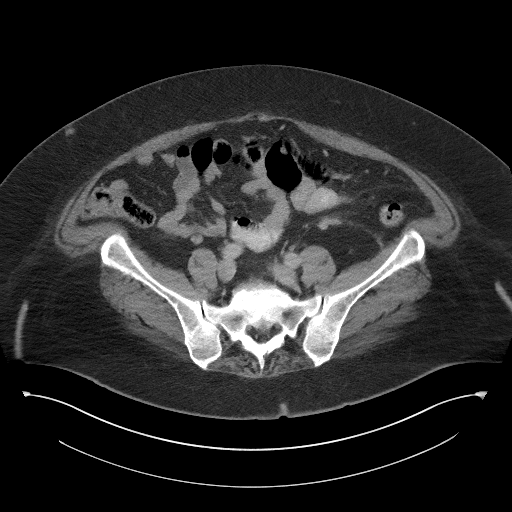
[im 46/109  soft-tissue]
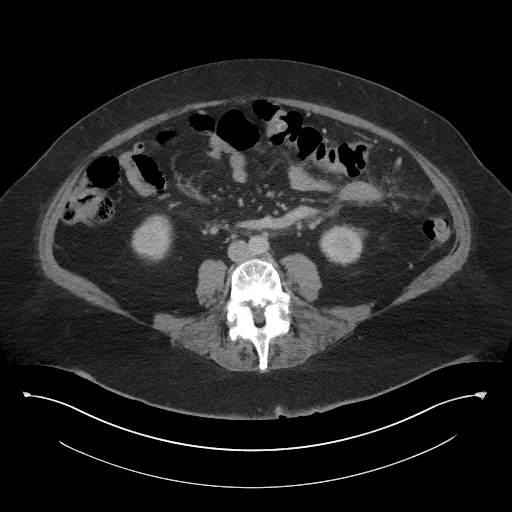
[im 63/109  soft-tissue]
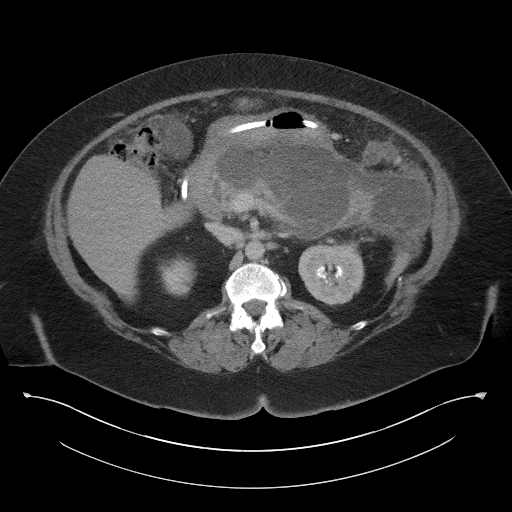
[im 74/109  soft-tissue]
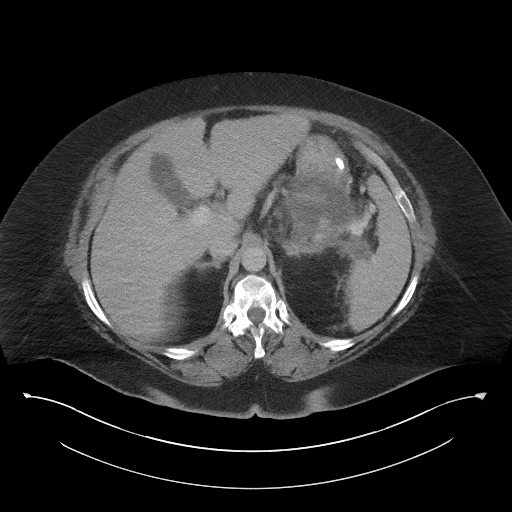
[im 86/109  soft-tissue]
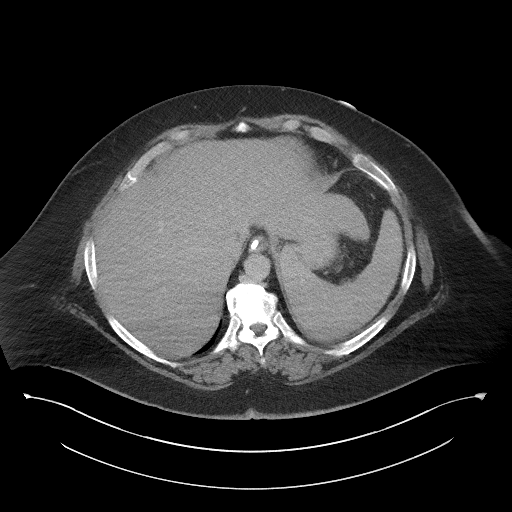
[im 97/109  soft-tissue]
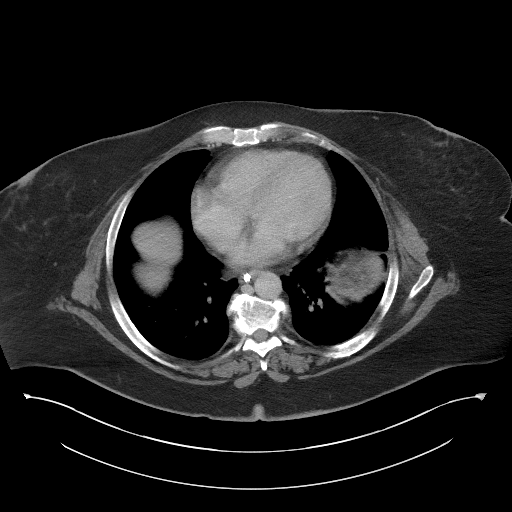

[Series 5: coronal st · coronal · 0.98mm/px · 3 of 103 slices shown]
[im 35/103  soft-tissue]
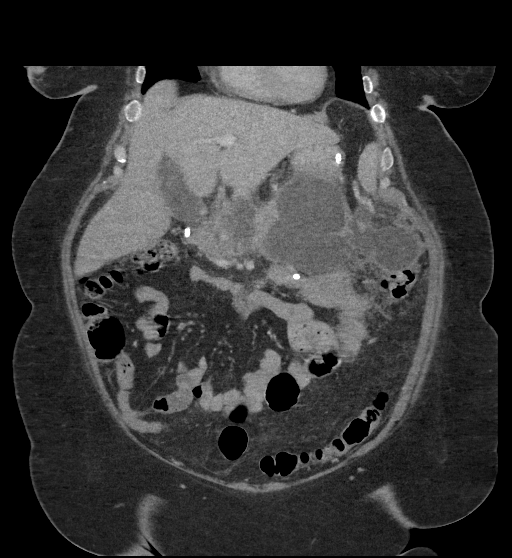
[im 46/103  soft-tissue]
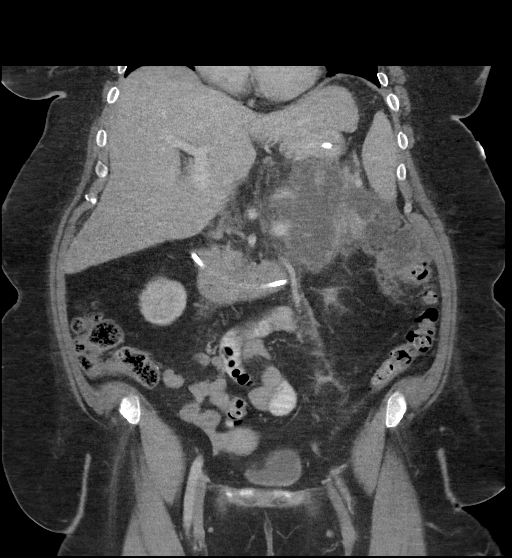
[im 57/103  soft-tissue]
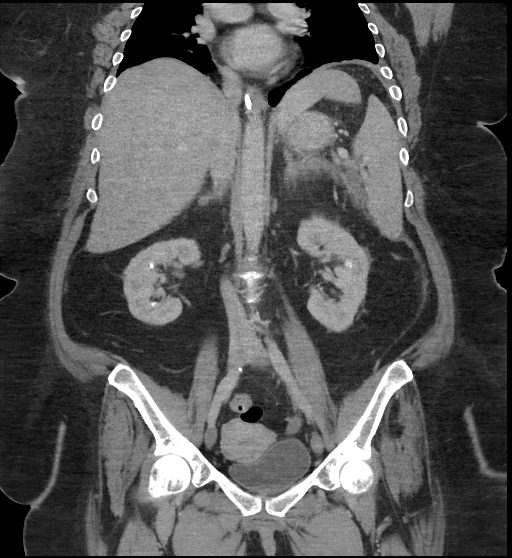

[11 of 46 positions shown; findings below may reference images not displayed]

FINDINGS: Lower chest: Atelectatic changes are present in the lung bases.
Normal heart size. No pericardial effusion.

Hepatobiliary: No focal liver abnormality is seen. No gallstones,
gallbladder wall thickening or pericholecystic inflammation.
Prominent fold at the gallbladder neck is similar to comparison. No
intra or extrahepatic biliary ductal dilatation.

Pancreas: There are extensive hypoattenuating parenchymal replacing
collections involving the pancreatic head body and tail, largest is
a multiloculated collection extending from the head to pancreatic
body measuring up to 7.9 x 12.2 x 10.4 cm in size with some
intermediate attenuation possibly reflecting hemorrhagic debris or
proteinaceous material (5/38, 2/48). Additional lobular collection
measuring approximately 5.2 x 6.7 x 6.3 cm involving the pancreatic
tail. Extensive peripancreatic inflammation is seen. Per outside
report dated [DATE] the pancreatic parenchyma previously
demonstrated areas of hypoattenuation compatible with necrotizing
pancreatitis. Given time course from prior imaging these collections
are most compatible with acute necrotic collections.
Nonvisualization of the pancreatic duct slight narrowing of the
splenic vein without complete occlusion or thrombus formation.

Spleen: Normal in size without focal abnormality.

Adrenals/Urinary Tract: Normal adrenal glands. Bilateral diffuse
medullary calcification similar to comparison studies. Stable mild
bilateral symmetric perinephric stranding, a nonspecific finding
though may correlate with either age or decreased renal function.
Kidneys are otherwise unremarkable, without renal calculi,
suspicious lesion, or hydronephrosis. Bladder is unremarkable.

Stomach/Bowel: Transesophageal tube terminates within the proximal
jejunum beyond the ligament of Treitz. Distal esophagus, stomach and
duodenal sweep are unremarkable. Several loops of small bowel
demonstrate some edematous thickening with adjacent inflammatory
changes likely arising from the pancreatic process detailed above.
No evidence of bowel obstruction. Postsurgical changes at the cecal
tip compatible with prior appendectomy. There is segmental
thickening of the splenic flexure as it comes in close proximity to
the tail of the pancreas. No resulting obstruction. Scattered
colonic diverticula without focal pericolonic inflammation to
suggest diverticulitis.

Vascular/Lymphatic: Atherosclerotic plaque within the normal caliber
aorta. Reactive adenopathy in the upper abdomen with edematous
retroperitoneal and intraperitoneal changes, as detailed above.

Reproductive: Anteverted uterus. No concerning adnexal lesions.

Other: Intraperitoneal and retroperitoneal inflammatory changes
centered upon the pancreatic process as detailed above. Small amount
of inflammatory retroperitoneal fluid is noted. No free air. No
bowel containing hernias. Soft tissue nodularity in the low anterior
abdomen subcutaneous compatible with injectable use such as insulin
or anticoagulation.

Musculoskeletal: Multilevel degenerative changes are present
throughout the thoracic and lumbar spine. Multilevel flowing
anterior osteophytosis of the upper thoracic spine, compatible with
features of diffuse idiopathic skeletal hyperostosis (DISH).
Anterolisthesis L4 on L5 with bilateral L4 pars defects.
IMPRESSION: 1. Extensive large intrapancreatic and peripancreatic fluid
collections associated with the edematous and necrotic pancreas in
the setting of known necrotizing pancreatitis. Given time course
from the initial imaging (less than 4 weeks) these collections are
most compatible with acute necrotic collection per revised [facility]
criteria.
2. Slight compression of the splenic vein without demonstrable
splenic vein thrombus.
3. Extensive intraperitoneal and retroperitoneal inflammatory
changes secondary to the pancreatic inflammation above. Associated
reactive thickening of both the small bowel and splenic flexure. No
resulting evidence of bowel obstruction.
4. Bilateral medullary nephrocalcinosis, similar to comparison
studies.
5. Colonic diverticulosis without evidence of diverticulitis.
6. Anterolisthesis L4 on L5 with bilateral L4 pars defects.
7. Soft tissue nodularity in the low anterior abdomen subcutaneous
tissues compatible with injectable use such as insulin or
anticoagulation.
8. Aortic Atherosclerosis ([SQ]-[SQ]).

ADDENDUM:
Following further discussion with the ordering provider, the larger
pancreatic collection with debris noted in the superior aspect is
notably positioned along the greater curvature of the stomach. There
is loss of discernible fat plane. It is difficult to ascertain
whether there is developing fistulization or direct involvement of
the greater curvature of the stomach though this is certainly a
possibility given reported history of 'black' emesis which could
reflect either bile, blood or necrosis. Consider urgent GI
consultation and consideration of endoscopy.

These results were called by telephone at the time of interpretation
on [DATE] at [DATE] to provider [facility] , who verbally
acknowledged these results.

*** End of Addendum ***
FINDINGS: Lower chest: Atelectatic changes are present in the lung bases.
Normal heart size. No pericardial effusion.

Hepatobiliary: No focal liver abnormality is seen. No gallstones,
gallbladder wall thickening or pericholecystic inflammation.
Prominent fold at the gallbladder neck is similar to comparison. No
intra or extrahepatic biliary ductal dilatation.

Pancreas: There are extensive hypoattenuating parenchymal replacing
collections involving the pancreatic head body and tail, largest is
a multiloculated collection extending from the head to pancreatic
body measuring up to 7.9 x 12.2 x 10.4 cm in size with some
intermediate attenuation possibly reflecting hemorrhagic debris or
proteinaceous material (5/38, 2/48). Additional lobular collection
measuring approximately 5.2 x 6.7 x 6.3 cm involving the pancreatic
tail. Extensive peripancreatic inflammation is seen. Per outside
report dated [DATE] the pancreatic parenchyma previously
demonstrated areas of hypoattenuation compatible with necrotizing
pancreatitis. Given time course from prior imaging these collections
are most compatible with acute necrotic collections.
Nonvisualization of the pancreatic duct slight narrowing of the
splenic vein without complete occlusion or thrombus formation.

Spleen: Normal in size without focal abnormality.

Adrenals/Urinary Tract: Normal adrenal glands. Bilateral diffuse
medullary calcification similar to comparison studies. Stable mild
bilateral symmetric perinephric stranding, a nonspecific finding
though may correlate with either age or decreased renal function.
Kidneys are otherwise unremarkable, without renal calculi,
suspicious lesion, or hydronephrosis. Bladder is unremarkable.

Stomach/Bowel: Transesophageal tube terminates within the proximal
jejunum beyond the ligament of Treitz. Distal esophagus, stomach and
duodenal sweep are unremarkable. Several loops of small bowel
demonstrate some edematous thickening with adjacent inflammatory
changes likely arising from the pancreatic process detailed above.
No evidence of bowel obstruction. Postsurgical changes at the cecal
tip compatible with prior appendectomy. There is segmental
thickening of the splenic flexure as it comes in close proximity to
the tail of the pancreas. No resulting obstruction. Scattered
colonic diverticula without focal pericolonic inflammation to
suggest diverticulitis.

Vascular/Lymphatic: Atherosclerotic plaque within the normal caliber
aorta. Reactive adenopathy in the upper abdomen with edematous
retroperitoneal and intraperitoneal changes, as detailed above.

Reproductive: Anteverted uterus. No concerning adnexal lesions.

Other: Intraperitoneal and retroperitoneal inflammatory changes
centered upon the pancreatic process as detailed above. Small amount
of inflammatory retroperitoneal fluid is noted. No free air. No
bowel containing hernias. Soft tissue nodularity in the low anterior
abdomen subcutaneous compatible with injectable use such as insulin
or anticoagulation.

Musculoskeletal: Multilevel degenerative changes are present
throughout the thoracic and lumbar spine. Multilevel flowing
anterior osteophytosis of the upper thoracic spine, compatible with
features of diffuse idiopathic skeletal hyperostosis (DISH).
Anterolisthesis L4 on L5 with bilateral L4 pars defects.
IMPRESSION: 1. Extensive large intrapancreatic and peripancreatic fluid
collections associated with the edematous and necrotic pancreas in
the setting of known necrotizing pancreatitis. Given time course
from the initial imaging (less than 4 weeks) these collections are
most compatible with acute necrotic collection per revised [facility]
criteria.
2. Slight compression of the splenic vein without demonstrable
splenic vein thrombus.
3. Extensive intraperitoneal and retroperitoneal inflammatory
changes secondary to the pancreatic inflammation above. Associated
reactive thickening of both the small bowel and splenic flexure. No
resulting evidence of bowel obstruction.
4. Bilateral medullary nephrocalcinosis, similar to comparison
studies.
5. Colonic diverticulosis without evidence of diverticulitis.
6. Anterolisthesis L4 on L5 with bilateral L4 pars defects.
7. Soft tissue nodularity in the low anterior abdomen subcutaneous
tissues compatible with injectable use such as insulin or
anticoagulation.
8. Aortic Atherosclerosis ([SQ]-[SQ]).

## 2019-07-17 MED ORDER — MORPHINE SULFATE (PF) 4 MG/ML IV SOLN
4.0000 mg | Freq: Once | INTRAVENOUS | Status: AC
  Administered 2019-07-17: 4 mg via INTRAVENOUS
  Filled 2019-07-17: qty 1

## 2019-07-17 MED ORDER — SODIUM CHLORIDE 0.9 % IV BOLUS (SEPSIS)
1000.0000 mL | Freq: Once | INTRAVENOUS | Status: AC
  Administered 2019-07-17: 05:00:00 1000 mL via INTRAVENOUS

## 2019-07-17 MED ORDER — ONDANSETRON HCL 4 MG/2ML IJ SOLN
4.0000 mg | Freq: Four times a day (QID) | INTRAMUSCULAR | Status: DC
  Administered 2019-07-17 – 2019-07-18 (×4): 4 mg via INTRAVENOUS
  Filled 2019-07-17 (×4): qty 2

## 2019-07-17 MED ORDER — PANTOPRAZOLE SODIUM 40 MG IV SOLR
INTRAVENOUS | Status: AC
  Filled 2019-07-17: qty 80

## 2019-07-17 MED ORDER — LURASIDONE HCL 40 MG PO TABS
60.0000 mg | ORAL_TABLET | Freq: Every day | ORAL | Status: DC
  Administered 2019-07-17: 60 mg via ORAL
  Filled 2019-07-17: qty 1

## 2019-07-17 MED ORDER — SENNA 8.6 MG PO TABS
1.0000 | ORAL_TABLET | Freq: Two times a day (BID) | ORAL | Status: DC
  Administered 2019-07-17 – 2019-07-21 (×9): 8.6 mg via ORAL
  Filled 2019-07-17 (×9): qty 1

## 2019-07-17 MED ORDER — OXYCODONE HCL 5 MG PO TABS
10.0000 mg | ORAL_TABLET | Freq: Four times a day (QID) | ORAL | Status: AC | PRN
  Administered 2019-07-17 – 2019-07-20 (×8): 10 mg via ORAL
  Filled 2019-07-17 (×8): qty 2

## 2019-07-17 MED ORDER — ONDANSETRON HCL 4 MG/2ML IJ SOLN
4.0000 mg | Freq: Once | INTRAMUSCULAR | Status: AC
  Administered 2019-07-17: 05:00:00 4 mg via INTRAVENOUS
  Filled 2019-07-17: qty 2

## 2019-07-17 MED ORDER — INSULIN REGULAR HUMAN 100 UNIT/ML IJ SOLN
10.0000 [IU] | Freq: Once | INTRAMUSCULAR | Status: AC
  Administered 2019-07-17: 06:00:00 10 [IU] via INTRAVENOUS
  Filled 2019-07-17: qty 1

## 2019-07-17 MED ORDER — FLEET ENEMA 7-19 GM/118ML RE ENEM: 1.0000 | ENEMA | Freq: Once | RECTAL | Status: DC | PRN

## 2019-07-17 MED ORDER — SODIUM CHLORIDE 0.9 % IV SOLN
80.0000 mg | Freq: Once | INTRAVENOUS | Status: AC
  Administered 2019-07-17: 80 mg via INTRAVENOUS
  Filled 2019-07-17: qty 80

## 2019-07-17 MED ORDER — ONDANSETRON HCL 4 MG/2ML IJ SOLN
4.0000 mg | Freq: Four times a day (QID) | INTRAMUSCULAR | Status: DC | PRN
  Administered 2019-07-17: 12:00:00 4 mg via INTRAVENOUS
  Filled 2019-07-17: qty 2

## 2019-07-17 MED ORDER — BUPROPION HCL 100 MG PO TABS
100.0000 mg | ORAL_TABLET | Freq: Two times a day (BID) | ORAL | Status: DC
  Administered 2019-07-17: 100 mg via ORAL
  Filled 2019-07-17 (×3): qty 1

## 2019-07-17 MED ORDER — SERTRALINE HCL 100 MG PO TABS
100.0000 mg | ORAL_TABLET | Freq: Every day | ORAL | Status: DC
  Administered 2019-07-17 – 2019-07-18 (×2): 100 mg via ORAL
  Filled 2019-07-17 (×2): qty 1

## 2019-07-17 MED ORDER — INSULIN ASPART 100 UNIT/ML ~~LOC~~ SOLN
0.0000 [IU] | Freq: Every day | SUBCUTANEOUS | Status: DC
  Administered 2019-07-17 – 2019-07-19 (×3): 2 [IU] via SUBCUTANEOUS

## 2019-07-17 MED ORDER — LAMOTRIGINE 100 MG PO TABS
200.0000 mg | ORAL_TABLET | Freq: Every day | ORAL | Status: DC
  Administered 2019-07-17 – 2019-07-21 (×5): 200 mg via ORAL
  Filled 2019-07-17 (×6): qty 2

## 2019-07-17 MED ORDER — SODIUM CHLORIDE 0.9% FLUSH
3.0000 mL | Freq: Two times a day (BID) | INTRAVENOUS | Status: DC
  Administered 2019-07-17 – 2019-07-20 (×7): 3 mL via INTRAVENOUS

## 2019-07-17 MED ORDER — ALBUTEROL SULFATE (2.5 MG/3ML) 0.083% IN NEBU: 3.0000 mL | INHALATION_SOLUTION | Freq: Four times a day (QID) | RESPIRATORY_TRACT | Status: DC | PRN

## 2019-07-17 MED ORDER — MAGNESIUM SULFATE 2 GM/50ML IV SOLN
2.0000 g | Freq: Once | INTRAVENOUS | Status: AC
  Administered 2019-07-17: 2 g via INTRAVENOUS
  Filled 2019-07-17: qty 50

## 2019-07-17 MED ORDER — ACETAMINOPHEN 650 MG RE SUPP: 650.0000 mg | Freq: Four times a day (QID) | RECTAL | Status: DC | PRN

## 2019-07-17 MED ORDER — MAGNESIUM HYDROXIDE 400 MG/5ML PO SUSP
30.0000 mL | Freq: Every day | ORAL | Status: DC | PRN
  Administered 2019-07-18: 30 mL via ORAL
  Filled 2019-07-17: qty 30

## 2019-07-17 MED ORDER — OLANZAPINE 5 MG PO TABS
10.0000 mg | ORAL_TABLET | Freq: Every day | ORAL | Status: DC
  Administered 2019-07-17 – 2019-07-20 (×4): 10 mg via ORAL
  Filled 2019-07-17 (×4): qty 2

## 2019-07-17 MED ORDER — AMITRIPTYLINE HCL 25 MG PO TABS
50.0000 mg | ORAL_TABLET | Freq: Every day | ORAL | Status: DC
  Administered 2019-07-17 – 2019-07-20 (×4): 50 mg via ORAL
  Filled 2019-07-17 (×3): qty 2

## 2019-07-17 MED ORDER — PANTOPRAZOLE SODIUM 40 MG IV SOLR
40.0000 mg | Freq: Two times a day (BID) | INTRAVENOUS | Status: DC
  Administered 2019-07-17 – 2019-07-18 (×4): 40 mg via INTRAVENOUS
  Filled 2019-07-17 (×4): qty 40

## 2019-07-17 MED ORDER — INSULIN GLARGINE 100 UNIT/ML ~~LOC~~ SOLN: 25.0000 [IU] | Freq: Every day | SUBCUTANEOUS | Status: DC

## 2019-07-17 MED ORDER — ENOXAPARIN SODIUM 60 MG/0.6ML ~~LOC~~ SOLN: 60.0000 mg | SUBCUTANEOUS | Status: DC

## 2019-07-17 MED ORDER — ONDANSETRON HCL 4 MG PO TABS
4.0000 mg | ORAL_TABLET | Freq: Four times a day (QID) | ORAL | Status: DC
  Administered 2019-07-18 – 2019-07-20 (×8): 4 mg via ORAL
  Filled 2019-07-17 (×8): qty 1

## 2019-07-17 MED ORDER — INSULIN GLARGINE 100 UNIT/ML ~~LOC~~ SOLN
10.0000 [IU] | Freq: Every day | SUBCUTANEOUS | Status: DC
  Administered 2019-07-17: 10 [IU] via SUBCUTANEOUS
  Filled 2019-07-17 (×2): qty 0.1

## 2019-07-17 MED ORDER — IOHEXOL 300 MG/ML  SOLN
125.0000 mL | Freq: Once | INTRAMUSCULAR | Status: AC | PRN
  Administered 2019-07-17: 06:00:00 125 mL via INTRAVENOUS

## 2019-07-17 MED ORDER — MOMETASONE FURO-FORMOTEROL FUM 200-5 MCG/ACT IN AERO
2.0000 | INHALATION_SPRAY | Freq: Two times a day (BID) | RESPIRATORY_TRACT | Status: DC
  Administered 2019-07-17 – 2019-07-21 (×7): 2 via RESPIRATORY_TRACT
  Filled 2019-07-17: qty 8.8

## 2019-07-17 MED ORDER — INSULIN ASPART 100 UNIT/ML ~~LOC~~ SOLN
0.0000 [IU] | Freq: Three times a day (TID) | SUBCUTANEOUS | Status: DC
  Administered 2019-07-17: 18:00:00 3 [IU] via SUBCUTANEOUS
  Administered 2019-07-17: 2 [IU] via SUBCUTANEOUS
  Administered 2019-07-18: 7 [IU] via SUBCUTANEOUS
  Administered 2019-07-18: 3 [IU] via SUBCUTANEOUS
  Administered 2019-07-19: 13:00:00 5 [IU] via SUBCUTANEOUS
  Administered 2019-07-19: 3 [IU] via SUBCUTANEOUS
  Administered 2019-07-19: 18:00:00 7 [IU] via SUBCUTANEOUS
  Administered 2019-07-20: 2 [IU] via SUBCUTANEOUS
  Administered 2019-07-20 (×2): 5 [IU] via SUBCUTANEOUS
  Administered 2019-07-21 (×2): 3 [IU] via SUBCUTANEOUS

## 2019-07-17 MED ORDER — LACTATED RINGERS IV SOLN: INTRAVENOUS | Status: DC

## 2019-07-17 MED ORDER — SORBITOL 70 % SOLN
30.0000 mL | Freq: Every day | Status: DC | PRN
  Administered 2019-07-20: 30 mL via ORAL
  Filled 2019-07-17: qty 30

## 2019-07-17 MED ORDER — INSULIN REGULAR HUMAN 100 UNIT/ML IJ SOLN
8.0000 [IU] | Freq: Once | INTRAMUSCULAR | Status: AC
  Administered 2019-07-17: 07:00:00 8 [IU] via INTRAVENOUS
  Filled 2019-07-17: qty 1

## 2019-07-17 MED ORDER — AMITRIPTYLINE HCL 25 MG PO TABS
100.0000 mg | ORAL_TABLET | Freq: Every day | ORAL | Status: DC
  Administered 2019-07-17 – 2019-07-20 (×4): 100 mg via ORAL
  Filled 2019-07-17 (×4): qty 4

## 2019-07-17 MED ORDER — SODIUM CHLORIDE 0.9 % IV BOLUS (SEPSIS)
1000.0000 mL | Freq: Once | INTRAVENOUS | Status: AC
  Administered 2019-07-17: 1000 mL via INTRAVENOUS

## 2019-07-17 MED ORDER — ONDANSETRON HCL 4 MG PO TABS: 4.0000 mg | ORAL_TABLET | Freq: Four times a day (QID) | ORAL | Status: DC | PRN

## 2019-07-17 MED ORDER — TRAZODONE HCL 50 MG PO TABS
100.0000 mg | ORAL_TABLET | Freq: Every day | ORAL | Status: DC
  Administered 2019-07-17 – 2019-07-20 (×4): 100 mg via ORAL
  Filled 2019-07-17 (×4): qty 2

## 2019-07-17 MED ORDER — ACETAMINOPHEN 325 MG PO TABS
650.0000 mg | ORAL_TABLET | Freq: Four times a day (QID) | ORAL | Status: DC | PRN
  Administered 2019-07-19 – 2019-07-20 (×4): 650 mg via ORAL
  Filled 2019-07-17 (×4): qty 2

## 2019-07-17 MED FILL — Insulin Regular (Human) Inj 100 Unit/ML: INTRAMUSCULAR | Qty: 0.1 | Status: AC

## 2019-07-17 MED FILL — Insulin Regular (Human) Inj 100 Unit/ML: INTRAMUSCULAR | Qty: 0.08 | Status: AC

## 2019-07-17 NOTE — ED Notes (Signed)
ED Provider at bedside. 

## 2019-07-17 NOTE — ED Notes (Signed)
Pt transported to CT ?

## 2019-07-17 NOTE — H&P (Signed)
[facility] History and Physical  Monique Holland XBD:[id number] DOB: 1965-09-13 DOA: 07/17/2019 Referring physician: ED PCP: Monique Cowman, MD  Chief Complaint: Dark-colored emesis with blood Came from: Home ------------------------------------------------------------------------------------------------------ Assessment/Plan: Active Problems:   Acute pancreatitis  Dark-colored blood mixed emesis -Possible causes: Mallory-Weiss tear from recurrent vomiting vs bleeding from pseudoaneurysm related to pseudocyst.  CT scan finding as below. -GI consult appreciated. -Noted a plan of EGD tomorrow. -Allowed for n.p.o. with meds at this time. -IV Protonix 40 mg twice daily. -Closely monitor for any further episode of bleeding which may require emergent EGD.  Recent acute necrotizing pancreatitis -Recently hospitalized at [facility] for 3 weeks for acute idiopathic necrotizing pancreatitis.  Details as below.  Was discharged a week ago with NJ feeding and a plan to follow-up with GI as an outpatient. -Lipase level remains slightly elevated at 91 at this time.  Type 2 diabetes mellitus -Home meds include Metformin 1000 mg twice daily and Humulin 70/30 70 units at night, 8 units in the morning. -While in the hospital, I will keep her on Lantus and sliding scale insulin.  Start with Lantus 20 units this morning.  Accu-Cheks with sliding scale. -Obtain A1c.  Hypertension -On losartan.  Continue to monitor.  IV hydralazine as needed.  Psychiatric disorders: Schizophrenia/anxiety/depression/bipolar disorder -Home meds include Amitriptyline 50 mg nightly, Lamictal, olanzapine, lurasidone, Zoloft, trazodone, PRN clonazepam. -With permission from GI, I will continue her oral psych meds. -EKG showed sinus tachycardia at 111 bpm with QTC prolonged at 503 ms. -Potassium level 4.9, magnesium level 1.9.  I will optimize magnesium level with IV replacement 2g MgSO4.  Arthritis, history of  knee replacement -On PRN Oxycodone 15 mg 3 times a day.  She says she can go few days without taking any oxycodone.  Morbid obesity - Body mass index is 43.65 kg/m. Patient has been advised to make an attempt to improve diet and exercise patterns to aid in weight loss.  OSA -Unclear if patient uses CPAP at home. Okay to resume if she does.  Hypercalcemia Hyperparathyroidism History of nephrolithiasis -During her recent hospitalization, she was also found to have hypercalcemia secondary to primary hyperparathyroidism.  CT neck showed parathyroid adenoma.  She also had relevant history of nephrolithiasis in the past.  On 07/04/2019, patient underwent left lower lobe parathyroidectomy.  Her PTH dropped appropriately during the procedure and she did not have hypocalcemia afterwards.  -I do not see any calcium supplement in her home med list. -Today, calcium level is normal at 9.4 with albumin level 3.6.  Continue to monitor.  DVT prophylaxis:  SCDs Antimicrobials:  None Fluid: LR at 100 mL/h Diet: N.p.o. except meds  Code Status:  Full code Mobility: Encourage ambulation.  At baseline, patient can ambulate with a cane.  May need PT evaluation if musculature is prolonged Family Communication:  I called and updated patient's husband this morning. Discharge plan:  Anticipated date: Anticipate more than 2 midnight stay Disposition: Hopefully home  Consultants:  GI    ----------------------------------------------------------------------------------------------------- History of Present Illness: Monique Holland is a 54 y.o. female with PMH significant for HTN, DM2, morbid obesity, OSA,, GERD, hyperparathyroidism, nephrolithiasis, schizophrenia, anxiety, depression. Patient presented to the ED earlier this morning with complaint of dark-colored emesis with bloody streaks last night.  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  She is a resident  of [place] but was visiting her in-laws in [place] at that time.  That her first episode of pancreatitis without history of alcohol use, gallstones or hypertriglyceridemia. She also likely has a pancreatic duct dysfunction that causes pain when eating.  She underwent NJ feeding, she refused PEG-J tube insertion.  She was discharged with nocturnal NJ tube feeding and only sips of water by mouth for comfort.  She was recommended to follow-up with GI in 2 to 3 weeks for repeat imaging and possible endoscopic drainage.  During that hospitalization, she was also found to have hypercalcemia secondary to primary hyperparathyroidism.  CT neck showed parathyroid adenoma.  She also had relevant history of nephrolithiasis in the past.  On 07/04/2019, patient underwent left lower lobe parathyroidectomy.  Her PTH dropped appropriately during the procedure and she did not have hypocalcemia afterwards. However, it was not clear at that time if hypercalcemia led to pancreatitis.   During that stay, she had shortness of breath and tachycardia.  PE was suspected but CT angio of chest 2/15 did not show any pulmonary embolism.  DVT scan of lower extremities 2/14 were negative as well.   Also, she was in DKA during that admission.  At discharge, she was continued on 70/30 insulin nightly before tube feeding and 18 units in the morning.  Trulicity and Janumet were stopped because of the risk of pancreatitis.  Metformin 1000 mg twice daily was started.  Patient states that even after discharge from [place] a week ago, she continued to have vomiting but this morning she had a dark-colored emesis with some blood.  No melena or bloody stool.  She has not had a bowel movement since her last discharge a week ago.  She complains of diffuse abdominal pain.  She has not  been able to keep down her diabetes her blood pressure medications.  Today in the ED, patient was afebrile, tachycardic to 120s, blood pressure elevated initially to 186/111, breathing comfortably on room air. Blood work showed sodium level low at 129, BUN/creatinine normal at 23/1, lipase slightly elevated to 91, liver enzymes normal, blood sugar level elevated to 581, WBC count normal at 5.4, hemoglobin 9.2, platelet 409.  CT abdomen pelvis showed: 1. Extensive large intrapancreatic and peripancreatic fluid collections associated with the edematous and necrotic pancreas in the setting of known necrotizing pancreatitis. Given time course from the initial imaging (less than 4 weeks) these collections are most compatible with acute necrotic collection per revised [place] Criteria.  The larger pancreatic collection with debris noted in the superior aspect is notably positioned along the greater curvature of the stomach. There is loss of discernible fat plane. It is difficult to ascertain whether there is developing fistulization or direct involvement of the greater curvature of the stomach though this is certainly a possibility given reported history of 'black' emesis which could reflect either bile, blood or necrosis. 2. Slight compression of the splenic vein without demonstrable splenic vein thrombus. 3. Extensive intraperitoneal and retroperitoneal inflammatory changes secondary to the pancreatic inflammation above. Associated reactive thickening of both the small bowel and splenic flexure. No resulting evidence of bowel obstruction.  As per the ED attending, with the above history and findings, GI consultation was called with on-call Dr. Rhea BeltonPyrtle.  Admission at [facility] was recommended.  At the time of my evaluation, patient was lying down in bed.  Had a feeding tube in her nose. GI team was also at bedside at that time.  Not in acute distress at this  time. Patient had changes in her medications  made during her recent hospitalization.  I confirmed the med list with her.   Review of Systems:  All systems were reviewed and were negative unless otherwise mentioned in the HPI   Past medical history: Past Medical History:  Diagnosis Date  . Anxiety   . Asthma   . Depression   . Diabetes mellitus without complication (HCC)   . Frequency of urination   . GERD (gastroesophageal reflux disease)   . History of asthma    last episode yrs ago  . Hypertension   . OSA (obstructive sleep apnea)    pt had study done oct 2014--  schedule for cpap titrate after kidney stone surgery  . Pancreatitis   . Schizophrenia (HCC)   . Ureteral calculi    BILATERAL  . Wears glasses     Past surgical history: Past Surgical History:  Procedure Laterality Date  . CESAREAN SECTION  1991  &  2002   w/ bilateral tubal ligation in 2002  . CYSTOSCOPY W/ URETERAL STENT PLACEMENT Right 08/10/2018   Procedure: CYSTOSCOPY WITH RETROGRADE PYELOGRAM/URETERAL STENT PLACEMENT;  Surgeon: Jerilee Field, MD;  Location: [facility];  Service: Urology;  Laterality: Right;  . CYSTOSCOPY WITH RETROGRADE PYELOGRAM, URETEROSCOPY AND STENT PLACEMENT Bilateral 05/02/2013   Procedure: CYSTOSCOPY WITH RETROGRADE PYELOGRAM, URETEROSCOPY ;  Surgeon: Valetta Fuller, MD;  Location: [facility];  Service: Urology;  Laterality: Bilateral;  . CYSTOSCOPY WITH RETROGRADE PYELOGRAM, URETEROSCOPY AND STENT PLACEMENT Right 08/15/2018   Procedure: CYSTOSCOPY WITH RIGHT RETROGRADE PYELOGRAM, RIGHT URETEROSCOPY WITH HOLMIUM LASER AND STENT PLACEMENT;  Surgeon: Crista Elliot, MD;  Location: [facility];  Service: Urology;  Laterality: Right;  . CYSTOSCOPY WITH STENT PLACEMENT Left 05/02/2013   Procedure: CYSTOSCOPY WITH STENT PLACEMENT;  Surgeon: Valetta Fuller, MD;  Location: [facility];  Service: Urology;  Laterality: Left;  . HOLMIUM LASER APPLICATION Bilateral 05/02/2013   Procedure: HOLMIUM LASER  APPLICATION;  Surgeon: Valetta Fuller, MD;  Location: [facility];  Service: Urology;  Laterality: Bilateral;  . LAPAROSCOPIC APPENDECTOMY N/A 05/22/2014   Procedure: APPENDECTOMY LAPAROSCOPIC;  Surgeon: Glenna Fellows, MD;  Location: [facility];  Service: General;  Laterality: N/A;  . TONSILLECTOMY  age 53    Social History:  reports that she has never smoked. She has never used smokeless tobacco. She reports that she does not drink alcohol or use drugs.  Allergies:  No Known Allergies  Family history:  No family history on file.   Home Meds: Prior to Admission medications   Medication Sig Start Date End Date Taking? Authorizing Provider  albuterol (VENTOLIN HFA) 108 (90 Base) MCG/ACT inhaler Inhale 1-2 puffs into the lungs every 6 (six) hours as needed for wheezing or shortness of breath.    [provider]  amitriptyline (ELAVIL) 100 MG tablet Take 100 mg by mouth at bedtime. 06/05/19   [provider]  amitriptyline (ELAVIL) 50 MG tablet Take 50 mg by mouth at bedtime.    [provider]  atorvastatin (LIPITOR) 40 MG tablet Take 40 mg by mouth daily. 06/17/19   [provider]  buPROPion (WELLBUTRIN) 100 MG tablet Take 100 mg by mouth 2 (two) times daily.    [provider]  cephALEXin (KEFLEX) 500 MG capsule Take 1 capsule (500 mg total) by mouth 4 (four) times daily. 03/23/19   Melene Plan, DO  clonazePAM (KLONOPIN) 0.5 MG tablet Take 0.5 mg by mouth daily  as needed for anxiety. 05/23/19   [provider]  diclofenac (VOLTAREN) 75 MG EC tablet Take 75 mg by mouth 2 (two) times daily. 07/15/19   [provider]  diphenhydrAMINE HCl, Sleep, (ZZZQUIL) 25 MG CAPS Take 25 mg by mouth at bedtime as needed (sleep).    [provider]  glimepiride (AMARYL) 2 MG tablet Take 2 mg by mouth daily with breakfast.    [provider]  JANUMET 50-1000 MG tablet Take 1 tablet by mouth 2 (two) times daily.  05/16/19   [provider]  lamoTRIgine (LAMICTAL) 200 MG tablet Take 200 mg by mouth daily. 06/06/19   [provider]  lisinopril-hydrochlorothiazide (ZESTORETIC) 20-12.5 MG tablet Take 2 tablets by mouth daily. 05/05/19   [provider]  losartan (COZAAR) 25 MG tablet Take 25 mg by mouth daily. 07/10/19   [provider]  lurasidone (LATUDA) 40 MG TABS tablet Take 60 mg by mouth at bedtime.    [provider]  metFORMIN (GLUCOPHAGE) 1000 MG tablet Take 1,000 mg by mouth 2 (two) times daily with a meal.    [provider]  OLANZapine (ZYPREXA) 10 MG tablet Take 10 mg by mouth at bedtime.    [provider]  omeprazole (PRILOSEC) 40 MG capsule Take 40 mg by mouth daily after breakfast.    [provider]  ondansetron (ZOFRAN ODT) 4 MG disintegrating tablet Take 1 tablet (4 mg total) by mouth every 8 (eight) hours as needed for nausea or vomiting. 08/04/18   Robinson, [place] N, PA-C  oxyCODONE (ROXICODONE) 15 MG immediate release tablet Take 15 mg by mouth 3 (three) times daily as needed for pain. 07/16/19   [provider]  sertraline (ZOLOFT) 100 MG tablet Take 100 mg by mouth daily.    [provider]  sitaGLIPtin (JANUVIA) 100 MG tablet Take 100 mg by mouth daily.    [provider]  SYMBICORT 160-4.5 MCG/ACT inhaler Inhale 2 puffs into the lungs 2 (two) times daily. 04/26/19   [provider]  tamsulosin (FLOMAX) 0.4 MG CAPS capsule Take 0.4 mg by mouth daily after breakfast.    [provider]  traZODone (DESYREL) 100 MG tablet Take 100 mg by mouth at bedtime.    [provider]    Physical Exam: Vitals:   07/17/19 0548 07/17/19 0655 07/17/19 0826 07/17/19 0937  BP: (!) 150/102 (!) 141/84 (!) 140/95 (!) 143/99  Pulse: (!) 110 (!) 110 (!) 109 (!) 108  Resp: 16 16  17   Temp:    98.7 F (37.1 C)  TempSrc:    Oral  SpO2: 98% 98% 96% 99%  Weight:      Height:        Wt Readings from Last 3 Encounters:  07/17/19 (!) 142 kg  03/23/19 (!) 145.2 kg  09/01/18 (!) 142 kg   Body mass index is 43.65 kg/m.  General exam: Appears calm and comfortable.  Skin: No rashes, lesions or ulcers. HEENT: Atraumatic, normocephalic, supple neck, no obvious bleeding.  Has a feeding tube through her nose Lungs: Clear to auscultation bilaterally CVS: Regular rate and rhythm, no murmur GI/Abd soft, distended from obesity, mild tenderness in epigastrium, bowel sound present CNS: Alert, awake, oriented x3 Psychiatry: Mood appropriate at this time Extremities: No pedal edema, no calf tenderness     Consult Orders  (From admission, onward)         Start     Ordered   07/17/19 0715  Consult  to hospitalist  ALL PATIENTS BEING ADMITTED/HAVING PROCEDURES NEED COVID-19 SCREENING Called made via [facility], spoke with Doug @ 716  Once    Comments: ALL PATIENTS BEING ADMITTED/HAVING PROCEDURES NEED COVID-19 SCREENING  Provider:  (Not yet assigned)  Question Answer Comment  Place call to: [facility]   Reason for Consult Admit      07/17/19 0714          Labs on Admission:   CBC: Recent Labs  Lab 07/17/19 0448  WBC 5.4  NEUTROABS 2.9  HGB 9.2*  HCT 31.2*  MCV 76.8*  PLT 409*    Basic Metabolic Panel: Recent Labs  Lab 07/17/19 0448  NA 129*  K 4.9  CL 94*  CO2 23  GLUCOSE 581*  BUN 23*  CREATININE 1.00  CALCIUM 9.4  MG 1.9    Liver Function Tests: Recent Labs  Lab 07/17/19 0448  AST 24  ALT 15  ALKPHOS 100  BILITOT 0.1*  PROT 8.0  ALBUMIN 3.6   Recent Labs  Lab 07/17/19 0448  LIPASE 91*   No results for input(s): AMMONIA in the last 168 hours.  Cardiac Enzymes: No results for input(s): CKTOTAL, CKMB, CKMBINDEX, TROPONINI in the last 168 hours.  BNP (last 3 results) No results for input(s): BNP in the last 8760 hours.  ProBNP (last 3 results) No results for input(s): PROBNP in the last 8760 hours.  CBG: Recent Labs   Lab 07/17/19 0652 07/17/19 0817  GLUCAP 331* 200*    Lipase     Component Value Date/Time   LIPASE 91 (H) 07/17/2019 0448     Urinalysis    Component Value Date/Time   COLORURINE YELLOW 07/17/2019 0529   APPEARANCEUR HAZY (A) 07/17/2019 0529   LABSPEC 1.010 07/17/2019 0529   PHURINE 7.0 07/17/2019 0529   GLUCOSEU >=500 (A) 07/17/2019 0529   HGBUR NEGATIVE 07/17/2019 0529   BILIRUBINUR NEGATIVE 07/17/2019 0529   KETONESUR NEGATIVE 07/17/2019 0529   PROTEINUR NEGATIVE 07/17/2019 0529   UROBILINOGEN 0.2 02/12/2015 1348   NITRITE NEGATIVE 07/17/2019 0529   LEUKOCYTESUR NEGATIVE 07/17/2019 0529     Drugs of Abuse  No results found for: LABOPIA, COCAINSCRNUR, LABBENZ, AMPHETMU, THCU, LABBARB    Radiological Exams on Admission: CT ABDOMEN PELVIS W CONTRAST  Addendum Date: 07/17/2019   ADDENDUM REPORT: 07/17/2019 06:59 ADDENDUM: Following further discussion with the ordering provider, the larger pancreatic collection with debris noted in the superior aspect is notably positioned along the greater curvature of the stomach. There is loss of discernible fat plane. It is difficult to ascertain whether there is developing fistulization or direct involvement of the greater curvature of the stomach though this is certainly a possibility given reported history of 'black' emesis which could reflect either bile, blood or necrosis. Consider urgent GI consultation and consideration of endoscopy. These results were called by telephone at the time of interpretation on 07/17/2019 at 6:59 am to provider [facility] , who verbally acknowledged these results. Electronically Signed   By: Kreg Shropshire M.D.   On: 07/17/2019 06:59   Result Date: 07/17/2019 CLINICAL DATA:  Generalized abdominal pain, recent hospitalization to [facility] for necrotizing pancreatitis. EXAM: CT ABDOMEN AND PELVIS WITH CONTRAST TECHNIQUE: Multidetector CT imaging of the abdomen and pelvis was performed using the standard protocol  following bolus administration of intravenous contrast. CONTRAST:  OMNIPAQUE IOHEXOL 300 MG/ML  SOLN COMPARISON:  CT chest 08/04/2018 and CT abdomen pelvis performed 06/21/2019 at outside facility (report only) FINDINGS: Lower chest: Atelectatic changes are  present in the lung bases. Normal heart size. No pericardial effusion. Hepatobiliary: No focal liver abnormality is seen. No gallstones, gallbladder wall thickening or pericholecystic inflammation. Prominent fold at the gallbladder neck is similar to comparison. No intra or extrahepatic biliary ductal dilatation. Pancreas: There are extensive hypoattenuating parenchymal replacing collections involving the pancreatic head body and tail, largest is a multiloculated collection extending from the head to pancreatic body measuring up to 7.9 x 12.2 x 10.4 cm in size with some intermediate attenuation possibly reflecting hemorrhagic debris or proteinaceous material (5/38, 2/48). Additional lobular collection measuring approximately 5.2 x 6.7 x 6.3 cm involving the pancreatic tail. Extensive peripancreatic inflammation is seen. Per outside report dated 06/21/2019 the pancreatic parenchyma previously demonstrated areas of hypoattenuation compatible with necrotizing pancreatitis. Given time course from prior imaging these collections are most compatible with acute necrotic collections. Nonvisualization of the pancreatic duct slight narrowing of the splenic vein without complete occlusion or thrombus formation. Spleen: Normal in size without focal abnormality. Adrenals/Urinary Tract: Normal adrenal glands. Bilateral diffuse medullary calcification similar to comparison studies. Stable mild bilateral symmetric perinephric stranding, a nonspecific finding though may correlate with either age or decreased renal function. Kidneys are otherwise unremarkable, without renal calculi, suspicious lesion, or hydronephrosis. Bladder is unremarkable. Stomach/Bowel: Transesophageal  tube terminates within the proximal jejunum beyond the ligament of Treitz. Distal esophagus, stomach and duodenal sweep are unremarkable. Several loops of small bowel demonstrate some edematous thickening with adjacent inflammatory changes likely arising from the pancreatic process detailed above. No evidence of bowel obstruction. Postsurgical changes at the cecal tip compatible with prior appendectomy. There is segmental thickening of the splenic flexure as it comes in close proximity to the tail of the pancreas. No resulting obstruction. Scattered colonic diverticula without focal pericolonic inflammation to suggest diverticulitis. Vascular/Lymphatic: Atherosclerotic plaque within the normal caliber aorta. Reactive adenopathy in the upper abdomen with edematous retroperitoneal and intraperitoneal changes, as detailed above. Reproductive: Anteverted uterus. No concerning adnexal lesions. Other: Intraperitoneal and retroperitoneal inflammatory changes centered upon the pancreatic process as detailed above. Small amount of inflammatory retroperitoneal fluid is noted. No free air. No bowel containing hernias. Soft tissue nodularity in the low anterior abdomen subcutaneous compatible with injectable use such as insulin or anticoagulation. Musculoskeletal: Multilevel degenerative changes are present throughout the thoracic and lumbar spine. 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 IMPRESSION: 1. Extensive large intrapancreatic and peripancreatic fluid collections associated with the edematous and necrotic pancreas in the setting of known necrotizing pancreatitis. Given time course from the initial imaging (less than 4 weeks) these collections are most compatible with acute necrotic collection per revised [place] criteria. 2. Slight compression of the splenic  vein without demonstrable splenic vein thrombus. 3. Extensive intraperitoneal and retroperitoneal inflammatory changes secondary to the pancreatic inflammation above. Associated reactive thickening of both the small bowel and splenic flexure. No resulting evidence of bowel obstruction. 4. Bilateral medullary nephrocalcinosis, similar to comparison studies. 5. Colonic diverticulosis without evidence of diverticulitis. 6. 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 7. Soft tissue nodularity in the low anterior abdomen subcutaneous tissues compatible with injectable use such as insulin or anticoagulation. 8. Aortic Atherosclerosis (ICD10-I70.0). Electronically Signed: By: Kreg Shropshire M.D. On: 07/17/2019 06:40   ----------------------------------------------------------------------------------------------------------------------------------------------------------- Severity of Illness: The appropriate patient status for this patient is INPATIENT. Inpatient status is judged to be reasonable and necessary in order to provide  the required intensity of service to ensure the patient's safety. The patient's presenting symptoms, physical exam findings, and initial radiographic and laboratory data in the context of their chronic comorbidities is felt to place them at high risk for further clinical deterioration. Furthermore, it is not anticipated that the patient will be medically stable for discharge from the hospital within 2 midnights of admission. The following factors support the patient status of inpatient.   " The patient's presenting symptoms include dark-colored emesis with blood. " The worrisome physical exam findings include abdominal tenderness. " The initial radiographic and laboratory data are worrisome because of pancreatitis, pseudocyst. " The chronic co-morbidities include diabetes mellitus, recent prolonged hospitalization.   * I certify that at the point of admission it is my  clinical judgment that the patient will require inpatient hospital care spanning beyond 2 midnights from the point of admission due to high intensity of service, high risk for further deterioration and high frequency of surveillance required.*   Signed, Lorin Glass, MD [facility] 07/17/2019

## 2019-07-17 NOTE — H&P (View-Only) (Signed)
Consultation  Referring Provider: [facility]/Dr. Dahal primary Care Physician:  Knox Royalty, MD Primary Gastroenterologist: None/unassigned  Reason for Consultation: Coffee-ground emesis  HPI: Monique Holland is a 54 y.o. female , transferred from med [facility], this morning after she presented there with hematemesis. She has history of bipolar disorder, schizophrenia, diabetes, hypertension and morbid obesity. She has just returned home from prolonged hospitalization in [place] at [facility], 06/18/2019 through 07/10/2019 she was admitted with severe acute necrotizing pancreatitis, and nonocclusive splenic vein thrombosis and also found to have hypercalcemia felt secondary to [facility] hyperparathyroidism.  She underwent left lower lobe parathyroidectomy on 07/04/2019 while hospitalized. She was discharged with nocturnal tube feeds via nasoenteric tube. Patient says she has not been doing well since discharge from the hospital has had very poor p.o. intake and has been vomiting on a daily basis usually several times.  She has been very nauseated.  She had not noted any blood in the emesis until early in the or morning hours today when she vomited up some dark blood x1.  She has not had any further episodes since arrival to the ER.  She has been unable to keep down her oral meds.  She also has not had a bowel movement for multiple days.  Labs-hemoglobin was apparently in the 7 range on discharge from [facility].  Today hemoglobin 9.2 hematocrit 31.2/MCV 76 Sodium 129/potassium 4.9/glucose 581 creatinine 1 LFTs within normal limits, anion gap 12, lipase 91 CT the abdomen pelvis was done today showed no focal liver abnormality, no gallstones or gallbladder wall thickening, there are extensive hypointense you waiting fluid collections involving the pancreatic head body and tail the largest is multiloculated and extending from the head to the pancreatic body measuring 7.9 x 12.2 x 10.4  cm There is an additional fluid collection measuring 5.2 x 6.7 x 6.3 cm along the pancreatic tail, extensive peripancreatic inflammation.  Slight narrowing of the splenic vein without complete occlusion or thrombus formation.  Transesophageal tube terminating in the proximal jejunum, there are several edematous loops of small bowel and thickening of the splenic flexure of the colon.   Past Medical History:  Diagnosis Date  . Anxiety   . Asthma   . Depression   . Diabetes mellitus without complication (HCC)   . Frequency of urination   . GERD (gastroesophageal reflux disease)   . History of asthma    last episode yrs ago  . Hypertension   . OSA (obstructive sleep apnea)    pt had study done oct 2014--  schedule for cpap titrate after kidney stone surgery  . Pancreatitis   . Schizophrenia (HCC)   . Ureteral calculi    BILATERAL  . Wears glasses     Past Surgical History:  Procedure Laterality Date  . CESAREAN SECTION  1991  &  2002   w/ bilateral tubal ligation in 2002  . CYSTOSCOPY W/ URETERAL STENT PLACEMENT Right 08/10/2018   Procedure: CYSTOSCOPY WITH RETROGRADE PYELOGRAM/URETERAL STENT PLACEMENT;  Surgeon: Jerilee Field, MD;  Location: [facility];  Service: Urology;  Laterality: Right;  . CYSTOSCOPY WITH RETROGRADE PYELOGRAM, URETEROSCOPY AND STENT PLACEMENT Bilateral 05/02/2013   Procedure: CYSTOSCOPY WITH RETROGRADE PYELOGRAM, URETEROSCOPY ;  Surgeon: Valetta Fuller, MD;  Location: [facility];  Service: Urology;  Laterality: Bilateral;  . CYSTOSCOPY WITH RETROGRADE PYELOGRAM, URETEROSCOPY AND STENT PLACEMENT Right 08/15/2018   Procedure: CYSTOSCOPY WITH RIGHT RETROGRADE PYELOGRAM, RIGHT URETEROSCOPY WITH HOLMIUM LASER AND STENT PLACEMENT;  Surgeon:  Lucas Mallow, MD;  Location: [facility];  Service: Urology;  Laterality: Right;  . CYSTOSCOPY WITH STENT PLACEMENT Left 05/02/2013   Procedure: CYSTOSCOPY WITH STENT PLACEMENT;  Surgeon: Bernestine Amass, MD;  Location:  [facility];  Service: Urology;  Laterality: Left;  . HOLMIUM LASER APPLICATION Bilateral 10/62/6948   Procedure: HOLMIUM LASER APPLICATION;  Surgeon: Bernestine Amass, MD;  Location: [facility];  Service: Urology;  Laterality: Bilateral;  . LAPAROSCOPIC APPENDECTOMY N/A 05/22/2014   Procedure: APPENDECTOMY LAPAROSCOPIC;  Surgeon: Excell Seltzer, MD;  Location: [facility];  Service: General;  Laterality: N/A;  . TONSILLECTOMY  age 80    Prior to Admission medications   Medication Sig Start Date End Date Taking? Authorizing Provider  albuterol (VENTOLIN HFA) 108 (90 Base) MCG/ACT inhaler Inhale 1-2 puffs into the lungs every 6 (six) hours as needed for wheezing or shortness of breath.   Yes [provider]  amitriptyline (ELAVIL) 100 MG tablet Take 100 mg by mouth at bedtime. 06/05/19  Yes [provider]  atorvastatin (LIPITOR) 40 MG tablet Take 40 mg by mouth daily. 06/17/19  Yes [provider]  clonazePAM (KLONOPIN) 0.5 MG tablet Take 0.5 mg by mouth daily as needed for anxiety. 05/23/19  Yes [provider]  diclofenac (VOLTAREN) 75 MG EC tablet Take 75 mg by mouth 2 (two) times daily. 07/15/19  Yes [provider]  glimepiride (AMARYL) 2 MG tablet Take 2 mg by mouth daily with breakfast.   Yes [provider]  JANUMET 50-1000 MG tablet Take 1 tablet by mouth 2 (two) times daily. 05/16/19  Yes [provider]  lamoTRIgine (LAMICTAL) 200 MG tablet Take 200 mg by mouth daily. 06/06/19  Yes [provider]  LANTUS SOLOSTAR 100 UNIT/ML Solostar Pen Inject 12 Units into the skin at bedtime. 05/05/19  Yes [provider]  lisinopril-hydrochlorothiazide (ZESTORETIC) 20-12.5 MG tablet Take 2 tablets by mouth daily. 05/05/19  Yes [provider]  losartan (COZAAR) 25 MG tablet Take 25 mg by mouth daily. 07/10/19  Yes [provider]  OLANZapine (ZYPREXA) 5 MG tablet Take 5 mg by  mouth at bedtime. 06/01/19  Yes [provider]  omeprazole (PRILOSEC) 40 MG capsule Take 40 mg by mouth daily after breakfast.   Yes [provider]  SYMBICORT 160-4.5 MCG/ACT inhaler Inhale 2 puffs into the lungs 2 (two) times daily as needed (SOB/wheezing).  04/26/19  Yes [provider]  traZODone (DESYREL) 100 MG tablet Take 100 mg by mouth at bedtime.   Yes [provider]  TRULICITY 1.5 NI/6.2VO SOPN Inject 1.5 mg into the skin once a week. 05/02/19  Yes [provider]  ondansetron (ZOFRAN ODT) 4 MG disintegrating tablet Take 1 tablet (4 mg total) by mouth every 8 (eight) hours as needed for nausea or vomiting. Patient not taking: Reported on 07/17/2019 08/04/18   Robinson, [place] N, PA-C  oxyCODONE (ROXICODONE) 15 MG immediate release tablet Take 15 mg by mouth 3 (three) times daily as needed for pain. 07/16/19   [provider]    Current Facility-Administered Medications  Medication Dose Route Frequency Provider Last Rate Last Admin  . acetaminophen (TYLENOL) tablet 650 mg  650 mg Oral Q6H PRN Dahal, Marlowe Aschoff, MD       Or  . acetaminophen (TYLENOL) suppository 650 mg  650 mg Rectal Q6H PRN Dahal, Binaya, MD      . albuterol (PROVENTIL) (2.5 MG/3ML) 0.083% nebulizer solution 3 mL  3 mL Inhalation Q6H PRN Dahal, Melina Schools, MD      . amitriptyline (ELAVIL) tablet 100 mg  100 mg Oral QHS Dahal, Binaya, MD      . amitriptyline (ELAVIL) tablet 50 mg  50 mg Oral QHS Dahal, Melina Schools, MD      . buPROPion (WELLBUTRIN) tablet 100 mg  100 mg Oral BID Dahal, Binaya, MD      . enoxaparin (LOVENOX) injection 60 mg  60 mg Subcutaneous Q24H Dahal, Binaya, MD      . insulin aspart (novoLOG) injection 0-5 Units  0-5 Units Subcutaneous QHS Dahal, Binaya, MD      . insulin aspart (novoLOG) injection 0-9 Units  0-9 Units Subcutaneous TID WC Dahal, Binaya, MD      . lactated ringers infusion   Intravenous Continuous Dahal, Binaya, MD      . lamoTRIgine (LAMICTAL)  tablet 200 mg  200 mg Oral Daily Dahal, Binaya, MD      . lurasidone (LATUDA) tablet 60 mg  60 mg Oral QHS Dahal, Binaya, MD      . magnesium hydroxide (MILK OF MAGNESIA) suspension 30 mL  30 mL Oral Daily PRN Dahal, Binaya, MD      . magnesium sulfate IVPB 2 g 50 mL  2 g Intravenous Once Dahal, Binaya, MD      . mometasone-formoterol (DULERA) 200-5 MCG/ACT inhaler 2 puff  2 puff Inhalation BID Dahal, Binaya, MD      . OLANZapine (ZYPREXA) tablet 10 mg  10 mg Oral QHS Dahal, Binaya, MD      . ondansetron (ZOFRAN) tablet 4 mg  4 mg Oral Q6H PRN Dahal, Binaya, MD       Or  . ondansetron (ZOFRAN) injection 4 mg  4 mg Intravenous Q6H PRN Dahal, Binaya, MD      . pantoprazole (PROTONIX) injection 40 mg  40 mg Intravenous Q12H Dahal, Binaya, MD      . senna (SENOKOT) tablet 8.6 mg  1 tablet Oral BID Dahal, Binaya, MD      . sertraline (ZOLOFT) tablet 100 mg  100 mg Oral Daily Dahal, Binaya, MD      . sodium chloride flush (NS) 0.9 % injection 3 mL  3 mL Intravenous Q12H Dahal, Binaya, MD      . sodium phosphate (FLEET) 7-19 GM/118ML enema 1 enema  1 enema Rectal Once PRN Dahal, Binaya, MD      . sorbitol 70 % solution 30 mL  30 mL Oral Daily PRN Dahal, Binaya, MD      . traZODone (DESYREL) tablet 100 mg  100 mg Oral QHS Lorin Glass, MD        Allergies as of 07/17/2019  . (No Known Allergies)    History reviewed. No pertinent family history.  Social History   Socioeconomic History  . Marital status: Married    Spouse name: Not on file  . Number of children: Not on file  . Years of education: Not on file  . Highest education level: Not on file  Occupational History  . Not on file  Tobacco Use  . Smoking status: Never Smoker  . Smokeless tobacco: Never Used  Substance and Sexual Activity  . Alcohol use: No  . Drug use: No  . Sexual activity: Not on file  Other Topics Concern  . Not on file  Social History Narrative  . Not on file   Social Determinants of Health   Financial  Resource Strain:   . Difficulty of Paying  Living Expenses: Not on file  Food Insecurity:   . Worried About Programme researcher, broadcasting/film/video in the Last Year: Not on file  . Ran Out of Food in the Last Year: Not on file  Transportation Needs:   . Lack of Transportation (Medical): Not on file  . Lack of Transportation (Non-Medical): Not on file  Physical Activity:   . Days of Exercise per Week: Not on file  . Minutes of Exercise per Session: Not on file  Stress:   . Feeling of Stress : Not on file  Social Connections:   . Frequency of Communication with Friends and Family: Not on file  . Frequency of Social Gatherings with Friends and Family: Not on file  . Attends Religious Services: Not on file  . Active Member of Clubs or Organizations: Not on file  . Attends Banker Meetings: Not on file  . Marital Status: Not on file  Intimate Partner Violence:   . Fear of Current or Ex-Partner: Not on file  . Emotionally Abused: Not on file  . Physically Abused: Not on file  . Sexually Abused: Not on file    Review of Systems: Pertinent positive and negative review of systems were noted in the above HPI section.  All other review of systems was otherwise negative.  Physical Exam: Vital signs in last 24 hours: Temp:  [98.5 F (36.9 C)-98.7 F (37.1 C)] 98.7 F (37.1 C) (02/24 0937) Pulse Rate:  [108-121] 108 (02/24 0937) Resp:  [16-18] 17 (02/24 0937) BP: (140-186)/(84-111) 143/99 (02/24 0937) SpO2:  [96 %-99 %] 99 % (02/24 0937) Weight:  [142 kg] 142 kg (02/24 0438)   General:   Alert,  Well-developed, obese female, pleasant and cooperative in NAD Head:  Normocephalic and atraumatic. Eyes:  Sclera clear, no icterus.   Conjunctiva pink. Ears:  Normal auditory acuity. Nose:  No deformity, discharge,  or lesions.  Feeding tube in left nares Mouth:  No deformity or lesions.   Neck:  Supple; no masses or thyromegaly. Lungs:  Clear throughout to auscultation.   No wheezes, crackles, or  rhonchi. Heart:  Regular rate and rhythm; no murmurs, clicks, rubs,  or gallops. Abdomen: Morbidly obese, soft, bowel sounds present, nondistended, she has some mild upper abdominal tenderness, not focal, no palpable mass or hepatosplenomegaly Rectal:  Deferred  Msk:  Symmetrical without gross deformities. . Pulses:  Normal pulses noted. Extremities:  Without clubbing or edema. Neurologic:  Alert and  oriented x4;  grossly normal neurologically. Skin:  Intact without significant lesions or rashes.. Psych:  Alert and cooperative. Normal mood and affect.  Intake/Output from previous day: No intake/output data recorded. Intake/Output this shift: Total I/O In: 1100.5 [IV Piggyback:1100.5] Out: -   Lab Results: Recent Labs    07/17/19 0448  WBC 5.4  HGB 9.2*  HCT 31.2*  PLT 409*   BMET Recent Labs    07/17/19 0448  NA 129*  K 4.9  CL 94*  CO2 23  GLUCOSE 581*  BUN 23*  CREATININE 1.00  CALCIUM 9.4   LFT Recent Labs    07/17/19 0448  PROT 8.0  ALBUMIN 3.6  AST 24  ALT 15  ALKPHOS 100  BILITOT 0.1*   PT/INR Recent Labs    07/17/19 0448  LABPROT 12.8  INR 1.0   Hepatitis Panel No results for input(s): HEPBSAG, HCVAB, HEPAIGM, HEPBIGM in the last 72 hours.    IMPRESSION:  #80 54 year old female, diabetic with morbid obesity with recent prolonged  hospitalization at [facility] with acute severe necrotizing pancreatitis.  She was discharged home 1 week ago.  Her oral intake was poor at the time of discharge and she was discharged with nocturnal tube feedings. Etiology of pancreatitis possibly secondary to hypercalcemia versus medication induced #2 new diagnosis of hypercalcemia and hyperparathyroidism, status post left lower lobe parathyroidectomy 07/04/2019. #3 insulin-dependent diabetes mellitus-poor control #4 persistent nausea and vomiting since discharge from [facility], with onset of hematemesis/coffee-ground emesis this a.m. Patient has been hemodynamically stable and  hemoglobin actually up from discharge from [facility].  At this point does not look like a major upper GI bleed. Consider Mallory-Weiss tear, esophagitis, peptic ulcer disease, and also consider bleed secondary to possible erosion of pseudocyst into the stomach Etiology of nausea and vomiting likely secondary to large necrotic fluid collections with gastric compression  #5-anemia multifactorial #6 psychiatric disorders/schizophrenia and bipolar disorder #7 sleep apnea  Plan; allow clear liquids as tolerated, n.p.o. after midnight Plan to resume nocturnal tube feeds-hold tonight until post EGD Twice daily PPI IV Around-the-clock antiemetics  Serial hemoglobins and transfuse for hemoglobin 7 or less Patient will be scheduled for upper endoscopy in a.m. with Dr. Barron Alvine.  Procedure was discussed in detail with the patient including indications risks and benefits and she is agreeable to proceed.     Amy Esterwood PA-C 07/17/2019, 12:19 PM       Attending physician's note   I have taken an interval history, reviewed the chart and examined the patient. I agree with the Advanced Practitioner's note, impression and recommendations.  Several previous notes and CT reviewed.   Acute necrotizing pancreatitis (d/t ? Hypercalcemia/HCTZ/Trulicity) with significant immature fluid collections. J-tube in place, tolerating feeds well. Adm @ [facility] [place] 1/26-2/17.  Persistent N/V, now with UGI bleed. HD stable. D/d MW tear, PUD, gastritis, esophagitis. Doubt pancreatic pseudoaneurysm. Hb stable.  Associated morbid obesity with OSA, schizophrenia, new onset DM.  Plan: -IV Protonix. -Resume J-tube feeds. OK to have clear liquids p.o. today. -EGD in a.m. (N.p.o. past midnight, hold tube feeds after midnight).  Explained the risks and benefits including the risks of dislodging J-tube which may need repositioning by radiology. -Trend CBC. -If with brisk bleeding, CTA followed by IR consultation for  possible embolization.  -Use antiemetics (Phenergan) around-the-clock. -Would need repeat CT abdomen/pelvis in 4 weeks.  Suspect she would need endoscopic cystogastrostomy ([facility]) in future, if fluid collections do not resolve spontaneously. Will discuss with Dr. Meridee Score. -D/W Dr Barron Alvine.   Edman Circle, MD Corinda Gubler [place] [phone].

## 2019-07-17 NOTE — Consult Note (Addendum)
Consultation  Referring Provider: [facility]/Dr. Dahal primary Care Physician:  Knox Royalty, MD Primary Gastroenterologist: None/unassigned  Reason for Consultation: Coffee-ground emesis  HPI: Monique Holland is a 54 y.o. female , transferred from med [facility], this morning after she presented there with hematemesis. She has history of bipolar disorder, schizophrenia, diabetes, hypertension and morbid obesity. She has just returned home from prolonged hospitalization in [place] at [facility], 06/18/2019 through 07/10/2019 she was admitted with severe acute necrotizing pancreatitis, and nonocclusive splenic vein thrombosis and also found to have hypercalcemia felt secondary to [facility] hyperparathyroidism.  She underwent left lower lobe parathyroidectomy on 07/04/2019 while hospitalized. She was discharged with nocturnal tube feeds via nasoenteric tube. Patient says she has not been doing well since discharge from the hospital has had very poor p.o. intake and has been vomiting on a daily basis usually several times.  She has been very nauseated.  She had not noted any blood in the emesis until early in the or morning hours today when she vomited up some dark blood x1.  She has not had any further episodes since arrival to the ER.  She has been unable to keep down her oral meds.  She also has not had a bowel movement for multiple days.  Labs-hemoglobin was apparently in the 7 range on discharge from [facility].  Today hemoglobin 9.2 hematocrit 31.2/MCV 76 Sodium 129/potassium 4.9/glucose 581 creatinine 1 LFTs within normal limits, anion gap 12, lipase 91 CT the abdomen pelvis was done today showed no focal liver abnormality, no gallstones or gallbladder wall thickening, there are extensive hypointense you waiting fluid collections involving the pancreatic head body and tail the largest is multiloculated and extending from the head to the pancreatic body measuring 7.9 x 12.2 x 10.4  cm There is an additional fluid collection measuring 5.2 x 6.7 x 6.3 cm along the pancreatic tail, extensive peripancreatic inflammation.  Slight narrowing of the splenic vein without complete occlusion or thrombus formation.  Transesophageal tube terminating in the proximal jejunum, there are several edematous loops of small bowel and thickening of the splenic flexure of the colon.   Past Medical History:  Diagnosis Date  . Anxiety   . Asthma   . Depression   . Diabetes mellitus without complication (HCC)   . Frequency of urination   . GERD (gastroesophageal reflux disease)   . History of asthma    last episode yrs ago  . Hypertension   . OSA (obstructive sleep apnea)    pt had study done oct 2014--  schedule for cpap titrate after kidney stone surgery  . Pancreatitis   . Schizophrenia (HCC)   . Ureteral calculi    BILATERAL  . Wears glasses     Past Surgical History:  Procedure Laterality Date  . CESAREAN SECTION  1991  &  2002   w/ bilateral tubal ligation in 2002  . CYSTOSCOPY W/ URETERAL STENT PLACEMENT Right 08/10/2018   Procedure: CYSTOSCOPY WITH RETROGRADE PYELOGRAM/URETERAL STENT PLACEMENT;  Surgeon: Jerilee Field, MD;  Location: [facility];  Service: Urology;  Laterality: Right;  . CYSTOSCOPY WITH RETROGRADE PYELOGRAM, URETEROSCOPY AND STENT PLACEMENT Bilateral 05/02/2013   Procedure: CYSTOSCOPY WITH RETROGRADE PYELOGRAM, URETEROSCOPY ;  Surgeon: Valetta Fuller, MD;  Location: [facility];  Service: Urology;  Laterality: Bilateral;  . CYSTOSCOPY WITH RETROGRADE PYELOGRAM, URETEROSCOPY AND STENT PLACEMENT Right 08/15/2018   Procedure: CYSTOSCOPY WITH RIGHT RETROGRADE PYELOGRAM, RIGHT URETEROSCOPY WITH HOLMIUM LASER AND STENT PLACEMENT;  Surgeon:  Lucas Mallow, MD;  Location: [facility];  Service: Urology;  Laterality: Right;  . CYSTOSCOPY WITH STENT PLACEMENT Left 05/02/2013   Procedure: CYSTOSCOPY WITH STENT PLACEMENT;  Surgeon: Bernestine Amass, MD;  Location:  [facility];  Service: Urology;  Laterality: Left;  . HOLMIUM LASER APPLICATION Bilateral 10/62/6948   Procedure: HOLMIUM LASER APPLICATION;  Surgeon: Bernestine Amass, MD;  Location: [facility];  Service: Urology;  Laterality: Bilateral;  . LAPAROSCOPIC APPENDECTOMY N/A 05/22/2014   Procedure: APPENDECTOMY LAPAROSCOPIC;  Surgeon: Excell Seltzer, MD;  Location: [facility];  Service: General;  Laterality: N/A;  . TONSILLECTOMY  age 80    Prior to Admission medications   Medication Sig Start Date End Date Taking? Authorizing Provider  albuterol (VENTOLIN HFA) 108 (90 Base) MCG/ACT inhaler Inhale 1-2 puffs into the lungs every 6 (six) hours as needed for wheezing or shortness of breath.   Yes [provider]  amitriptyline (ELAVIL) 100 MG tablet Take 100 mg by mouth at bedtime. 06/05/19  Yes [provider]  atorvastatin (LIPITOR) 40 MG tablet Take 40 mg by mouth daily. 06/17/19  Yes [provider]  clonazePAM (KLONOPIN) 0.5 MG tablet Take 0.5 mg by mouth daily as needed for anxiety. 05/23/19  Yes [provider]  diclofenac (VOLTAREN) 75 MG EC tablet Take 75 mg by mouth 2 (two) times daily. 07/15/19  Yes [provider]  glimepiride (AMARYL) 2 MG tablet Take 2 mg by mouth daily with breakfast.   Yes [provider]  JANUMET 50-1000 MG tablet Take 1 tablet by mouth 2 (two) times daily. 05/16/19  Yes [provider]  lamoTRIgine (LAMICTAL) 200 MG tablet Take 200 mg by mouth daily. 06/06/19  Yes [provider]  LANTUS SOLOSTAR 100 UNIT/ML Solostar Pen Inject 12 Units into the skin at bedtime. 05/05/19  Yes [provider]  lisinopril-hydrochlorothiazide (ZESTORETIC) 20-12.5 MG tablet Take 2 tablets by mouth daily. 05/05/19  Yes [provider]  losartan (COZAAR) 25 MG tablet Take 25 mg by mouth daily. 07/10/19  Yes [provider]  OLANZapine (ZYPREXA) 5 MG tablet Take 5 mg by  mouth at bedtime. 06/01/19  Yes [provider]  omeprazole (PRILOSEC) 40 MG capsule Take 40 mg by mouth daily after breakfast.   Yes [provider]  SYMBICORT 160-4.5 MCG/ACT inhaler Inhale 2 puffs into the lungs 2 (two) times daily as needed (SOB/wheezing).  04/26/19  Yes [provider]  traZODone (DESYREL) 100 MG tablet Take 100 mg by mouth at bedtime.   Yes [provider]  TRULICITY 1.5 NI/6.2VO SOPN Inject 1.5 mg into the skin once a week. 05/02/19  Yes [provider]  ondansetron (ZOFRAN ODT) 4 MG disintegrating tablet Take 1 tablet (4 mg total) by mouth every 8 (eight) hours as needed for nausea or vomiting. Patient not taking: Reported on 07/17/2019 08/04/18   Robinson, [place] N, PA-C  oxyCODONE (ROXICODONE) 15 MG immediate release tablet Take 15 mg by mouth 3 (three) times daily as needed for pain. 07/16/19   [provider]    Current Facility-Administered Medications  Medication Dose Route Frequency Provider Last Rate Last Admin  . acetaminophen (TYLENOL) tablet 650 mg  650 mg Oral Q6H PRN Dahal, Marlowe Aschoff, MD       Or  . acetaminophen (TYLENOL) suppository 650 mg  650 mg Rectal Q6H PRN Dahal, Binaya, MD      . albuterol (PROVENTIL) (2.5 MG/3ML) 0.083% nebulizer solution 3 mL  3 mL Inhalation Q6H PRN Dahal, Melina SchoolsBinaya, MD      . amitriptyline (ELAVIL) tablet 100 mg  100 mg Oral QHS Dahal, Binaya, MD      . amitriptyline (ELAVIL) tablet 50 mg  50 mg Oral QHS Dahal, Melina SchoolsBinaya, MD      . buPROPion (WELLBUTRIN) tablet 100 mg  100 mg Oral BID Dahal, Binaya, MD      . enoxaparin (LOVENOX) injection 60 mg  60 mg Subcutaneous Q24H Dahal, Binaya, MD      . insulin aspart (novoLOG) injection 0-5 Units  0-5 Units Subcutaneous QHS Dahal, Binaya, MD      . insulin aspart (novoLOG) injection 0-9 Units  0-9 Units Subcutaneous TID WC Dahal, Binaya, MD      . lactated ringers infusion   Intravenous Continuous Dahal, Binaya, MD      . lamoTRIgine (LAMICTAL)  tablet 200 mg  200 mg Oral Daily Dahal, Binaya, MD      . lurasidone (LATUDA) tablet 60 mg  60 mg Oral QHS Dahal, Binaya, MD      . magnesium hydroxide (MILK OF MAGNESIA) suspension 30 mL  30 mL Oral Daily PRN Dahal, Binaya, MD      . magnesium sulfate IVPB 2 g 50 mL  2 g Intravenous Once Dahal, Binaya, MD      . mometasone-formoterol (DULERA) 200-5 MCG/ACT inhaler 2 puff  2 puff Inhalation BID Dahal, Binaya, MD      . OLANZapine (ZYPREXA) tablet 10 mg  10 mg Oral QHS Dahal, Binaya, MD      . ondansetron (ZOFRAN) tablet 4 mg  4 mg Oral Q6H PRN Dahal, Binaya, MD       Or  . ondansetron (ZOFRAN) injection 4 mg  4 mg Intravenous Q6H PRN Dahal, Binaya, MD      . pantoprazole (PROTONIX) injection 40 mg  40 mg Intravenous Q12H Dahal, Binaya, MD      . senna (SENOKOT) tablet 8.6 mg  1 tablet Oral BID Dahal, Binaya, MD      . sertraline (ZOLOFT) tablet 100 mg  100 mg Oral Daily Dahal, Binaya, MD      . sodium chloride flush (NS) 0.9 % injection 3 mL  3 mL Intravenous Q12H Dahal, Binaya, MD      . sodium phosphate (FLEET) 7-19 GM/118ML enema 1 enema  1 enema Rectal Once PRN Dahal, Binaya, MD      . sorbitol 70 % solution 30 mL  30 mL Oral Daily PRN Dahal, Binaya, MD      . traZODone (DESYREL) tablet 100 mg  100 mg Oral QHS Lorin Glassahal, Binaya, MD        Allergies as of 07/17/2019  . (No Known Allergies)    History reviewed. No pertinent family history.  Social History   Socioeconomic History  . Marital status: Married    Spouse name: Not on file  . Number of children: Not on file  . Years of education: Not on file  . Highest education level: Not on file  Occupational History  . Not on file  Tobacco Use  . Smoking status: Never Smoker  . Smokeless tobacco: Never Used  Substance and Sexual Activity  . Alcohol use: No  . Drug use: No  . Sexual activity: Not on file  Other Topics Concern  . Not on file  Social History Narrative  . Not on file   Social Determinants of Health   Financial  Resource Strain:   . Difficulty of Paying  Living Expenses: Not on file  Food Insecurity:   . Worried About Programme researcher, broadcasting/film/video in the Last Year: Not on file  . Ran Out of Food in the Last Year: Not on file  Transportation Needs:   . Lack of Transportation (Medical): Not on file  . Lack of Transportation (Non-Medical): Not on file  Physical Activity:   . Days of Exercise per Week: Not on file  . Minutes of Exercise per Session: Not on file  Stress:   . Feeling of Stress : Not on file  Social Connections:   . Frequency of Communication with Friends and Family: Not on file  . Frequency of Social Gatherings with Friends and Family: Not on file  . Attends Religious Services: Not on file  . Active Member of Clubs or Organizations: Not on file  . Attends Banker Meetings: Not on file  . Marital Status: Not on file  Intimate Partner Violence:   . Fear of Current or Ex-Partner: Not on file  . Emotionally Abused: Not on file  . Physically Abused: Not on file  . Sexually Abused: Not on file    Review of Systems: Pertinent positive and negative review of systems were noted in the above HPI section.  All other review of systems was otherwise negative.  Physical Exam: Vital signs in last 24 hours: Temp:  [98.5 F (36.9 C)-98.7 F (37.1 C)] 98.7 F (37.1 C) (02/24 0937) Pulse Rate:  [108-121] 108 (02/24 0937) Resp:  [16-18] 17 (02/24 0937) BP: (140-186)/(84-111) 143/99 (02/24 0937) SpO2:  [96 %-99 %] 99 % (02/24 0937) Weight:  [142 kg] 142 kg (02/24 0438)   General:   Alert,  Well-developed, obese female, pleasant and cooperative in NAD Head:  Normocephalic and atraumatic. Eyes:  Sclera clear, no icterus.   Conjunctiva pink. Ears:  Normal auditory acuity. Nose:  No deformity, discharge,  or lesions.  Feeding tube in left nares Mouth:  No deformity or lesions.   Neck:  Supple; no masses or thyromegaly. Lungs:  Clear throughout to auscultation.   No wheezes, crackles, or  rhonchi. Heart:  Regular rate and rhythm; no murmurs, clicks, rubs,  or gallops. Abdomen: Morbidly obese, soft, bowel sounds present, nondistended, she has some mild upper abdominal tenderness, not focal, no palpable mass or hepatosplenomegaly Rectal:  Deferred  Msk:  Symmetrical without gross deformities. . Pulses:  Normal pulses noted. Extremities:  Without clubbing or edema. Neurologic:  Alert and  oriented x4;  grossly normal neurologically. Skin:  Intact without significant lesions or rashes.. Psych:  Alert and cooperative. Normal mood and affect.  Intake/Output from previous day: No intake/output data recorded. Intake/Output this shift: Total I/O In: 1100.5 [IV Piggyback:1100.5] Out: -   Lab Results: Recent Labs    07/17/19 0448  WBC 5.4  HGB 9.2*  HCT 31.2*  PLT 409*   BMET Recent Labs    07/17/19 0448  NA 129*  K 4.9  CL 94*  CO2 23  GLUCOSE 581*  BUN 23*  CREATININE 1.00  CALCIUM 9.4   LFT Recent Labs    07/17/19 0448  PROT 8.0  ALBUMIN 3.6  AST 24  ALT 15  ALKPHOS 100  BILITOT 0.1*   PT/INR Recent Labs    07/17/19 0448  LABPROT 12.8  INR 1.0   Hepatitis Panel No results for input(s): HEPBSAG, HCVAB, HEPAIGM, HEPBIGM in the last 72 hours.    IMPRESSION:  #80 54 year old female, diabetic with morbid obesity with recent prolonged  hospitalization at [facility] with acute severe necrotizing pancreatitis.  She was discharged home 1 week ago.  Her oral intake was poor at the time of discharge and she was discharged with nocturnal tube feedings. Etiology of pancreatitis possibly secondary to hypercalcemia versus medication induced #2 new diagnosis of hypercalcemia and hyperparathyroidism, status post left lower lobe parathyroidectomy 07/04/2019. #3 insulin-dependent diabetes mellitus-poor control #4 persistent nausea and vomiting since discharge from [facility], with onset of hematemesis/coffee-ground emesis this a.m. Patient has been hemodynamically stable and  hemoglobin actually up from discharge from [facility].  At this point does not look like a major upper GI bleed. Consider Mallory-Weiss tear, esophagitis, peptic ulcer disease, and also consider bleed secondary to possible erosion of pseudocyst into the stomach Etiology of nausea and vomiting likely secondary to large necrotic fluid collections with gastric compression  #5-anemia multifactorial #6 psychiatric disorders/schizophrenia and bipolar disorder #7 sleep apnea  Plan; allow clear liquids as tolerated, n.p.o. after midnight Plan to resume nocturnal tube feeds-hold tonight until post EGD Twice daily PPI IV Around-the-clock antiemetics  Serial hemoglobins and transfuse for hemoglobin 7 or less Patient will be scheduled for upper endoscopy in a.m. with Dr. Barron Alvine.  Procedure was discussed in detail with the patient including indications risks and benefits and she is agreeable to proceed.     Amy Esterwood PA-C 07/17/2019, 12:19 PM       Attending physician's note   I have taken an interval history, reviewed the chart and examined the patient. I agree with the Advanced Practitioner's note, impression and recommendations.  Several previous notes and CT reviewed.   Acute necrotizing pancreatitis (d/t ? Hypercalcemia/HCTZ/Trulicity) with significant immature fluid collections. J-tube in place, tolerating feeds well. Adm @ [facility] [place] 1/26-2/17.  Persistent N/V, now with UGI bleed. HD stable. D/d MW tear, PUD, gastritis, esophagitis. Doubt pancreatic pseudoaneurysm. Hb stable.  Associated morbid obesity with OSA, schizophrenia, new onset DM.  Plan: -IV Protonix. -Resume J-tube feeds. OK to have clear liquids p.o. today. -EGD in a.m. (N.p.o. past midnight, hold tube feeds after midnight).  Explained the risks and benefits including the risks of dislodging J-tube which may need repositioning by radiology. -Trend CBC. -If with brisk bleeding, CTA followed by IR consultation for  possible embolization.  -Use antiemetics (Phenergan) around-the-clock. -Would need repeat CT abdomen/pelvis in 4 weeks.  Suspect she would need endoscopic cystogastrostomy ([facility]) in future, if fluid collections do not resolve spontaneously. Will discuss with Dr. Meridee Score. -D/W Dr Barron Alvine.   Edman Circle, MD Corinda [facility] [phone].

## 2019-07-17 NOTE — ED Triage Notes (Signed)
Pt dc from hospital x 1 week ago for pancreatitis. Pt reports vomiting since leaving hospital and tonight had dark colored blood in emesis. Pt has feeding tube in left nare.

## 2019-07-17 NOTE — ED Provider Notes (Addendum)
TIME SEEN: 4:56 AM  CHIEF COMPLAINT: Vomiting blood, abdominal pain  HPI: Patient is a 54 year old female with history of bipolar disorder, diabetes, hypertension, obesity who was recently admitted to [facility] in [place] 06/18/19 - 07/10/19 for necrotizing pancreatitis, nonocclusive splenic vein thrombus, primary hyperparathyroidism status post left lower lobe parathyroidectomy on 07/04/2019 who presents today with vomiting that started at 4 AM.  States this vomit appears to be black.  No maroon-colored blood, bright red blood or coffee-ground emesis.  No bloody stools or melena.  Has not had a bowel movement since being discharged from the hospital.  Reports diffuse abdominal pain.  No fever.  She denies being on antiplatelets or anticoagulants.  Reports she has had kidney stones and has had ureteral stenting.  Has had C-section x 2 and appendectomy previously.  NJ tube placed 06/29/2019.  Here in the emergency department with her husband.  States she was in [place] visiting in-laws when she started having abdominal pain and vomiting.  She does plan to follow-up with GI in [place].  They live here in [place].  Patient reports she has had abdominal pain and vomiting ever since discharge.  Reports she was not discharged with pain or nausea medication.  States because of that she is unable to keep down her diabetes her blood pressure medications.  It appears CT of the abdomen pelvis on 06/21/2019 showed findings of acute pancreatitis involving greater than 50% of the pancreas.  There was significant peripancreatic fluid without drainable organized fluid collection.  There is a focal nonocclusive splenic vein thrombus.  Patient also has hepatic steatosis.  Of note, patient had a CTA of her chest that showed no pulmonary embolus on 07/08/2019.  She did have a trace left-sided pleural effusion.  She had negative venous Dopplers of her bilateral lower extremities on 07/07/2019.  The studies  appear to be done secondary to shortness of breath and tachycardia.    Discharge Note from [facility] 07/05/19:  "Admitting Diagnosis: Necrotizing Pancreatitis  Principal Diagnosis for Hospital Admission (after further review): Necrotizing Pancreatitis  Additional Diagnoses Treated During This Hospitalization:  Hypercalcemia 2/2 hyperparathyroidism  History on Presentation (from H&P):  Monique Holland is a 54 y.o. female w/ PMH T2DM, HTN, and obesity presenting due to abdominal pain, vomiting, and diarrhea.  She notes that around 2130 she had acute onset of abdominal pain about 30 minutes after eating, followed by profuse emesis and some diarrhea.   The pain is primarily in the epigastrium and non-radiating, constant pain. Previously as high as 10/10, now 7/10. Nothing she has noticed has helped or hurt the pain. She has not had this problem before. She has no history of gallstones, but has had kidney stones.  She is not sure how many times she has vomited. She has noted some intermittent streaks of blood, but it was primarily green/yellow. She does still feel nauseous.  She has noted 3-4 episodes of brown liquid bowel movements without blood.   Denies fevers, chills, chest pain, shortness of breath, cough, dysuria, and sick contacts. Denies alcohol, tobacco or other drugs.   She had a blood transfusion last year for fainting spells, she believes her hemoglobin was low, but is unsure. She then was given iron replacement.   Mom, grandma, and uncle have hypertension and diabetes.  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 CT showed necrotizing pancreatitis with >50% non-occlusive splenic vein thrombus. Patient's  workup was negative for hypertriglyceridemia, alcohol use, biliary stone. She was found to have hypercalcemia 2/2 primary hyperparathyroidism as  below, and unclear if hypercalcemia is the cause. She also likely has pancreatic duct dysfunction which caused her to have pain and leukocytosis when eating. Decision was made to continue patient as NPO to reduce pancreatic inflammation and allow healing. NJ tube was placed on 2/6 as patient opted against PEG-J. Was discharged with only sips of water for comfort and with nocturnal tube feeds. At day of discharge she did not have any abdominal symptoms or nausea. She will follow up with GI for repeat imaging and possible endoscopic drainage. Nutrition team is following her for tube feed support.   Recommendations:  - Continue NPO except for sips of water (until GI clinic follow-up) - Continue NJT with nocturnal tube feeds per nutrition  - Per GI will reimage fluid collection in 2-3 weeks; if collection has matured adequately they will pursue endoscopic drainage - Follow up with GI and Nutrition Support Team  - Follow up 2/10 IGG levels  Moderate Hypercalcemia 2/2 Primary Hyperparathyroidism (s/p LLL parathyroidectomy 2/11)  Calcium and PTH both elevated on admission.CT neckshowed parathyroid adenoma. Because she has been symptomatic with kidney stones in the past (also possibly causing pancreatitis) she underwent left lower lobe parathyroidectomy on 07/04/2019. Her PTH dropped appropriately during procedure and she did not have hypocalcemia afterwards.   Recommendations:  - Endocrine Surgery follow up scheduled 08/09/2019 - Ergocalciferol weekly for 8 weeks for anticipated post operative hypocalcemia  - Thyroid nodule requires outpatient follow up (endocrine will manage)  A-o-C Anemia Iron deficiency Anemia Hemoglobin 12.1 on admission with general downtrend to 7 prior to discharge. Likely multi-factorial 2/2 IDA, anemia of chronic disease, phlebotomy. She was given IV Feraheme and transfused 1 unit RBCs prior to discharge.   Recommendations:  - CBC at PCP follow up (referral made to [facility])  Asthma, Mild Intermittent She reported dyspnea and had associated wheezing on exam. however did not recall ever being diagnosed with asthma. Per chart review she previously was on Symbicort and had albuterol and epi pen. Re-started her Symbicort and Albuterol at discharge with instructions to use. Also switched from Lisinopril to Losartan out of concern for bradykinin-induced bronchospasm.   Recommendations:  - Re-start previous Symbicort 2 puffs BID - Re-start previous Albuterol PRN  T2DM c/b ketoacidosis  DKA on admission with AG 17, bicarb 15, A1c 7.8. No prior episodes of DKA.Newly elevated insulin requirements during admission, likelydue to worsening pancreatic function given extensive necrosis. High insulin requirements also suggest significant peripheral insulin resistance. Endocrine provided recommendations and will stop Lantus and start 70/30 twice daily to simplify her regimen. She should stop GLP-1 agonist and DPP4-inhibitor due to risk of pancreatitis.  - Start Humulin 70/30 70u QHS prior to nocturnal tube feeds  - Start Humulin 70/30 8u every morning  - Stop Lantus - Stop Trulicity and Janumet due to pancreatitis risk - Start Metformin 1000 BID   Chronic/ Resolved Problems: Hypertension  Initially poorly controlled in setting of pain but improved after pain was controlled. Stopped HCTZ as there was concern this could have contributed to pancreatitis. Changed from Lisinopril to Losartan due to concern for bronchospasm.  - Start Losartan - D/C PTA Lisinopril - D/C HCTZ - Continue PTA Carvedilol  - PCP follow up ([facility])  Bipolar Disorder:  Patient on amitriptyline, olanzapine, and lamotrigine PTA, however she is unsure why. Has been on these medicines for years  and did not change her home regimen during admission.  Recommendations: - Decreased Amitriptyline to 50mg  qhs - Lamotrigine 200 mg qday - Olanzapine 5 mg qhs "   PCP - Dr.  Kristie Cowman, [facility]   ROS: See HPI, limited as patient and husband are somewhat poor historians Constitutional: no fever  Eyes: no drainage  ENT: no runny nose   Cardiovascular:  no chest pain  Resp: no SOB  GI:  + vomiting GU: no dysuria Integumentary: no rash  Allergy: no hives  Musculoskeletal: no leg swelling  Neurological: no slurred speech ROS otherwise negative  PAST MEDICAL HISTORY/PAST SURGICAL HISTORY:  Past Medical History:  Diagnosis Date  . Anxiety   . Asthma   . Depression   . Diabetes mellitus without complication ([place])   . Frequency of urination   . GERD (gastroesophageal reflux disease)   . History of asthma    last episode yrs ago  . Hypertension   . OSA (obstructive sleep apnea)    pt had study done oct 2014--  schedule for cpap titrate after kidney stone surgery  . Pancreatitis   . Schizophrenia ([place])   . Ureteral calculi    BILATERAL  . Wears glasses     MEDICATIONS:  Prior to Admission medications   Medication Sig Start Date End Date Taking? Authorizing Provider  albuterol (VENTOLIN HFA) 108 (90 Base) MCG/ACT inhaler Inhale 1-2 puffs into the lungs every 6 (six) hours as needed for wheezing or shortness of breath.    [provider]  amitriptyline (ELAVIL) 50 MG tablet Take 50 mg by mouth at bedtime.    [provider]  buPROPion (WELLBUTRIN) 100 MG tablet Take 100 mg by mouth 2 (two) times daily.    [provider]  cephALEXin (KEFLEX) 500 MG capsule Take 1 capsule (500 mg total) by mouth 4 (four) times daily. 03/23/19   Deno Etienne, DO  diphenhydrAMINE HCl, Sleep, (ZZZQUIL) 25 MG CAPS Take 25 mg by mouth at bedtime as needed (sleep).    [provider]  glimepiride (AMARYL) 2 MG tablet Take 2 mg by mouth daily with breakfast.    [provider]  lurasidone (LATUDA) 40 MG TABS tablet Take 60 mg by mouth at bedtime.    [provider]  metFORMIN (GLUCOPHAGE) 1000 MG tablet Take 1,000  mg by mouth 2 (two) times daily with a meal.    [provider]  OLANZapine (ZYPREXA) 10 MG tablet Take 10 mg by mouth at bedtime.    [provider]  omeprazole (PRILOSEC) 40 MG capsule Take 40 mg by mouth daily after breakfast.    [provider]  ondansetron (ZOFRAN ODT) 4 MG disintegrating tablet Take 1 tablet (4 mg total) by mouth every 8 (eight) hours as needed for nausea or vomiting. 08/04/18   Robinson, [place] N, PA-C  sertraline (ZOLOFT) 100 MG tablet Take 100 mg by mouth daily.    [provider]  sitaGLIPtin (JANUVIA) 100 MG tablet Take 100 mg by mouth daily.    [provider]  tamsulosin (FLOMAX) 0.4 MG CAPS capsule Take 0.4 mg by mouth daily after breakfast.    [provider]  traZODone (DESYREL) 100 MG tablet Take 100 mg by mouth at bedtime.    [provider]    ALLERGIES:  No Known Allergies  SOCIAL HISTORY:  Social History   Tobacco Use  . Smoking status: Never Smoker  . Smokeless tobacco: Never Used  Substance Use Topics  .  Alcohol use: No    FAMILY HISTORY: No family history on file.  EXAM: BP (!) 186/111   Pulse (!) 121   Temp 98.5 F (36.9 C) (Oral)   Resp 18   Ht 5\' 11"  (1.803 m)   Wt (!) 142 kg   LMP 03/23/2014 Comment: patient states her periods are irregular  SpO2 98%   BMI 43.65 kg/m  CONSTITUTIONAL: Alert and oriented and responds appropriately to questions.  Chronically ill-appearing.  Obese.  In no distress. HEAD: Normocephalic EYES: Conjunctivae clear, pupils appear equal, EOM appear intact ENT: normal nose; moist mucous membranes, NJ tube in the left nare NECK: Supple, normal ROM CARD: Regular and tachycardic; S1 and S2 appreciated; no murmurs, no clicks, no rubs, no gallops RESP: Normal chest excursion without splinting or tachypnea; breath sounds clear and equal bilaterally; no wheezes, no rhonchi, no rales, no hypoxia or respiratory distress, speaking full sentences ABD/GI:  Normal bowel sounds; non-distended; soft, tender to palpation diffusely, no rebound, no guarding, no peritoneal signs, no hepatosplenomegaly RECTAL:  Normal rectal tone, no gross blood or melena, no stool at all in the rectal vault, no hemorrhoids appreciated, nontender rectal exam, no fecal impaction BACK:  The back appears normal EXT: Normal ROM in all joints; no deformity noted, no edema; no cyanosis SKIN: Normal color for age and race; warm; no rash on exposed skin NEURO: Moves all extremities equally PSYCH: The patient's mood and manner are appropriate.   MEDICAL DECISION MAKING: Patient here with reports of hematemesis and abdominal pain.  Recently admitted for necrotizing pancreatitis and underwent partial parathyroidectomy during her hospitalization.  Here with NJ tube in place and receiving nutrition through the NJ tube.  Had a partial splenic thrombus on CT scan at [facility] but does not appear to be on antiplatelets or anticoagulants.  Will obtain labs, urine, CT of the abdomen pelvis today.  Will give morphine, IV fluids, Zofran.  Not actively vomiting.  She is tachycardic but hypertensive.  Will monitor very closely.  ED PROGRESS: Patient's hemoglobin on discharge was 7.0 on 07/10/19.  Hb is currently 9.5.  She has reactive thrombocytosis.  Normal INR.  Blood glucose 581.  Normal anion gap and bicarb.  Will give IV insulin.  She is receiving IV fluids.  Potassium normal at 4.9.  Magnesium is normal at 1.9.  Patient's LFTs are normal.  Lipase is minimally elevated at 91.  Urine shows 6-10 white blood cells and many bacteria but no other sign of infection.  She is not currently having urinary symptoms.  6:45 AM  Pt's blood pressure improved with a more appropriate blood pressure cuff.  Blood sugar is coming down slowly.  Will give another dose of IV insulin.  She is getting a second liter of IV fluid.  She has not had any further vomiting here.  She states she is feeling better but is still  diffusely tender on exam.  Discussed patient CT imaging with Dr. 07/12/19, radiologist.  He is not able to see the imaging from 06/20/2018 one of the CT abdomen pelvis at [facility] but can read the report.  There is no mention of a fluid collection at that time.  He states based on the appearance today and the report from [facility], it appears that this is getting worse.  She has multiple extensive fluid collections today from her necrotizing pancreatitis.  He does state that near the greater curvature of the stomach there is a fluid collection with no fat plane in  between the stomach and the fluid collection and could represent a fistula that is developing.  Given that this could also be blood, will start IV Protonix.  She does not have a local gastroenterologist.  Will consult GI and admit to medicine.  Will obtain rapid Covid swab in case patient needs emergent procedure.  7:10 AM  Discussed with Dr. Rhea Belton with gastroenterology.  They will consult on patient this morning.  Agrees with medicine admission to [facility].  Patient updated with plan.   I reviewed all nursing notes and pertinent previous records as available.  I have interpreted any EKGs, lab and urine results, imaging (as available).   EKG Interpretation  Date/Time:  Wednesday July 17 2019 05:25:58 EST Ventricular Rate:  111 PR Interval:    QRS Duration: 92 QT Interval:  370 QTC Calculation: 503 R Axis:   14 Text Interpretation: Sinus tachycardia Borderline prolonged QT interval No significant change since last tracing other than QT is longer Confirmed by Rochele Raring [phone]) on 07/17/2019 5:29:55 AM       CRITICAL CARE Performed by: Baxter Hire Mir Fullilove   Total critical care time: 50 minutes  Critical care time was exclusive of separately billable procedures and treating other patients.  Critical care was necessary to treat or prevent imminent or life-threatening deterioration.  Critical care was time spent personally by me  on the following activities: development of treatment plan with patient and/or surrogate as well as nursing, discussions with consultants, evaluation of patient's response to treatment, examination of patient, obtaining history from patient or surrogate, ordering and performing treatments and interventions, ordering and review of laboratory studies, ordering and review of radiographic studies, pulse oximetry and re-evaluation of patient's condition.     Monique Holland was evaluated in Emergency Department on 07/17/2019 for the symptoms described in the history of present illness. She was evaluated in the context of the global COVID-19 pandemic, which necessitated consideration that the patient might be at risk for infection with the SARS-CoV-2 virus that causes COVID-19. Institutional protocols and algorithms that pertain to the evaluation of patients at risk for COVID-19 are in a state of rapid change based on information released by regulatory bodies including the CDC and federal and state organizations. These policies and algorithms were followed during the patient's care in the ED.  Patient was seen wearing N95, face shield, gloves.        Modell Fendrick, Layla Maw, DO 07/17/19 [phone]

## 2019-07-17 NOTE — Progress Notes (Signed)
Pharmacy Brief Note:  Discussed PTA insulin dosing with MD.   Upon med rec completed on admission, pt reported taking lantus 12 units qHS.   Verbal order received for lantus 10 units qHS.  Cindi Carbon, PharmD 07/17/19 5:52 PM

## 2019-07-18 ENCOUNTER — Inpatient Hospital Stay (HOSPITAL_COMMUNITY): Payer: 59 | Admitting: Certified Registered Nurse Anesthetist

## 2019-07-18 ENCOUNTER — Encounter (HOSPITAL_COMMUNITY): Payer: Self-pay | Admitting: Internal Medicine

## 2019-07-18 ENCOUNTER — Encounter (HOSPITAL_COMMUNITY)
Admission: EM | Disposition: A | Discharge status: Home / Self Care | Payer: Self-pay | Source: Home / Self Care | Attending: Internal Medicine

## 2019-07-18 DIAGNOSIS — R112 Nausea with vomiting, unspecified: Secondary | ICD-10-CM

## 2019-07-18 DIAGNOSIS — K2990 Gastroduodenitis, unspecified, without bleeding: Secondary | ICD-10-CM

## 2019-07-18 DIAGNOSIS — B3781 Candidal esophagitis: Secondary | ICD-10-CM

## 2019-07-18 DIAGNOSIS — K2970 Gastritis, unspecified, without bleeding: Secondary | ICD-10-CM

## 2019-07-18 DIAGNOSIS — K228 Other specified diseases of esophagus: Secondary | ICD-10-CM

## 2019-07-18 DIAGNOSIS — K8591 Acute pancreatitis with uninfected necrosis, unspecified: Secondary | ICD-10-CM

## 2019-07-18 HISTORY — PX: ESOPHAGOGASTRODUODENOSCOPY (EGD) WITH PROPOFOL: SHX5813

## 2019-07-18 HISTORY — PX: BIOPSY: SHX5522

## 2019-07-18 LAB — BASIC METABOLIC PANEL
Anion gap: 10 (ref 5–15)
BUN: 14 mg/dL (ref 6–20)
CO2: 25 mmol/L (ref 22–32)
Calcium: 8.8 mg/dL — ABNORMAL LOW (ref 8.9–10.3)
Chloride: 99 mmol/L (ref 98–111)
Creatinine, Ser: 1 mg/dL (ref 0.44–1.00)
GFR calc Af Amer: >60 mL/min (ref >60)
GFR calc non Af Amer: >60 mL/min (ref >60)
Glucose, Bld: 306 mg/dL — ABNORMAL HIGH (ref 70–99)
Potassium: 4.3 mmol/L (ref 3.5–5.1)
Sodium: 134 mmol/L — ABNORMAL LOW (ref 135–145)

## 2019-07-18 LAB — GLUCOSE, CAPILLARY
Glucose-Capillary: 232 mg/dL — ABNORMAL HIGH (ref 70–99)
Glucose-Capillary: 304 mg/dL — ABNORMAL HIGH (ref 70–99)
Glucose-Capillary: 232 mg/dL — ABNORMAL HIGH (ref 70–99)
Glucose-Capillary: 225 mg/dL — ABNORMAL HIGH (ref 70–99)

## 2019-07-18 LAB — HEMOGLOBIN AND HEMATOCRIT, BLOOD
HCT: 27.7 % — ABNORMAL LOW (ref 36.0–46.0)
HCT: 28.7 % — ABNORMAL LOW (ref 36.0–46.0)
Hemoglobin: 8 g/dL — ABNORMAL LOW (ref 12.0–15.0)
Hemoglobin: 8.3 g/dL — ABNORMAL LOW (ref 12.0–15.0)

## 2019-07-18 LAB — MAGNESIUM: Magnesium: 2 mg/dL (ref 1.7–2.4)

## 2019-07-18 SURGERY — ESOPHAGOGASTRODUODENOSCOPY (EGD) WITH PROPOFOL
Anesthesia: Monitor Anesthesia Care

## 2019-07-18 MED ORDER — SODIUM CHLORIDE 0.9 % IV SOLN: INTRAVENOUS | Status: DC

## 2019-07-18 MED ORDER — PROPOFOL 10 MG/ML IV BOLUS
INTRAVENOUS | Status: AC
  Filled 2019-07-18: qty 20

## 2019-07-18 MED ORDER — PROPOFOL 10 MG/ML IV BOLUS
INTRAVENOUS | Status: DC | PRN
  Administered 2019-07-18: 20 mg via INTRAVENOUS
  Administered 2019-07-18: 40 mg via INTRAVENOUS
  Administered 2019-07-18: 20 mg via INTRAVENOUS

## 2019-07-18 MED ORDER — PROPOFOL 500 MG/50ML IV EMUL
INTRAVENOUS | Status: AC
  Filled 2019-07-18: qty 50

## 2019-07-18 MED ORDER — INSULIN GLARGINE 100 UNIT/ML ~~LOC~~ SOLN
15.0000 [IU] | Freq: Every day | SUBCUTANEOUS | Status: DC
  Administered 2019-07-18 – 2019-07-19 (×2): 15 [IU] via SUBCUTANEOUS
  Filled 2019-07-18 (×4): qty 0.15

## 2019-07-18 MED ORDER — LACTATED RINGERS IV SOLN: INTRAVENOUS | Status: DC

## 2019-07-18 MED ORDER — FLUCONAZOLE 100 MG PO TABS
400.0000 mg | ORAL_TABLET | Freq: Once | ORAL | Status: AC
  Administered 2019-07-18: 400 mg via ORAL
  Filled 2019-07-18: qty 4

## 2019-07-18 MED ORDER — PROPOFOL 500 MG/50ML IV EMUL
INTRAVENOUS | Status: DC | PRN
  Administered 2019-07-18: 100 ug/kg/min via INTRAVENOUS

## 2019-07-18 MED ORDER — LIDOCAINE 2% (20 MG/ML) 5 ML SYRINGE
INTRAMUSCULAR | Status: DC | PRN
  Administered 2019-07-18: 100 mg via INTRAVENOUS

## 2019-07-18 MED ORDER — LURASIDONE HCL 40 MG PO TABS
40.0000 mg | ORAL_TABLET | Freq: Every day | ORAL | Status: DC
  Administered 2019-07-18 – 2019-07-20 (×3): 40 mg via ORAL
  Filled 2019-07-18 (×4): qty 1

## 2019-07-18 MED ORDER — FLUCONAZOLE 100 MG PO TABS
200.0000 mg | ORAL_TABLET | Freq: Every day | ORAL | Status: DC
  Administered 2019-07-19 – 2019-07-21 (×3): 200 mg via ORAL
  Filled 2019-07-18 (×3): qty 2

## 2019-07-18 SURGICAL SUPPLY — 15 items

## 2019-07-18 NOTE — Progress Notes (Addendum)
PROGRESS NOTE    Monique Holland  BJS:[id number] DOB: [birth year] DOA: 07/17/2019 PCP: Kristie Cowman, MD   Brief Narrative: 54 year old with past medical history significant for diabetes type 2, hypertension, morbid obese, OSA, hyperparathyroidism, nephrolithiasis, schizophrenia, who presents to the ED complaining of vomiting dark-colored red emesis with bloody streaks.  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  She underwent NJ feeding placement for nutrition.  She was discharged with Dr. Clerance Lav tube feeding and sips of water by mouth for comfort.  During that hospitalization she was also found to have hypercalcemia secondary to primary hyperparathyroidism.  CT chest showed parathyroid adenoma.  Patient underwent left left parathyroidectomy. Evaluation in the ED CT abdomen pelvis show extensive large intrapancreatic and peripancreatic fluid collection associated with erythematosus necrosis of the pancreas.  Slight compression of the splenic vein without demonstrable splenic vein thrombosis  Assessment & Plan:   Active Problems:   Acute pancreatitis   Nausea and vomiting in adult   Necrotizing pancreatitis   Gastritis and gastroduodenitis   Candida esophagitis (HCC)  1-coffee-ground emesis: Patient underwent endoscopy on 12/25 which showed diffuse plaque entire esophagus.  No bleeding noted.  Moderate inflammation and erythema in the gastric atrium.  Not ulcers or erosion. Started on Diflucan for Candida esophagitis. Hemoglobin remained stable. Continue with PPI.   2-recent history of acute necrotizing pancreatitis: Patient has Pukalani for nocturnal tube feedings.  Per GI okay to resume nocturnal tube feedings. Patient is started on liquid diet. Per GI patient might need in the future endoscopic cystogastrostomy for pancreatic fluid  collection if this does not resolve.  3-type 2 diabetes: Continue with Lantus and sliding scale insulin adjust as needed.  Hypertension: Continue with losartan.  Psychiatric disorder: Schizophrenia, bipolar: Continue with amitriptyline Lamictal olanzapine lurasidone.  Stopped Zoloft she reported she had not been taking this medication. Monitor QT closely now that she is on Diflucan as well.  Hypercalcemia, hyperparathyroidism, history of nephrolithiasis: During her recent hospitalization she was found to have hypercalcemia secondary to primary hyperparathyroidism.  She underwent went parathyroidectomy. Continue to monitor calcium level.  OSA resume CPAP  Morbid obesity: Need diet and exercise education Prolonged QT: Check mag level and follow-up EKG daily. Estimated body mass index is 43.65 kg/m as calculated from the following:   Height as of this encounter: 5' 11"  (1.803 m).   Weight as of this encounter: 142 kg.   DVT prophylaxis: SCDs, no anticoagulation due to GI bleed Code Status: Full code Family Communication: Care discussed with patient Disposition Plan:  Patient is from: Home Anticipated d/c date: 1 or 2 days if she is able to tolerate diet Barriers to d/c or necessity for inpatient status: Post endoscopy today for evaluation of melena.  Need to monitor hemoglobin and make sure patient is able to tolerate diet  Consultants:   GI  Procedures:   Endoscopy  Antimicrobials:    Subjective: She report feeling better, nausea improved.  No further vomiting.  She relate that she was eating at home  Objective: Vitals:   07/18/19 0749 07/18/19 0900 07/18/19 0910 07/18/19 1406  BP: (!) 176/99 134/71 140/84 (!) 131/99  Pulse: (!) 108 (!) 112 (!) 105 (!) 108  Resp: 13 15 15 16   Temp: 98.8 F (37.1 C) 98 F (36.7 C)  98.6 F (37 C)  TempSrc: Oral Oral    SpO2: 95% 100% 98% 98%  Weight: (!) 142 kg     Height: 5' 11"  (1.803 m)       Intake/Output Summary (Last  24 hours) at 07/18/2019 1524 Last data filed at 07/18/2019 0901 Gross per 24 hour  Intake 1140 ml  Output --  Net 1140 ml   Filed Weights   07/17/19 0438 07/18/19 0749  Weight: (!) 142 kg (!) 142 kg    Examination:  General exam: Appears calm and comfortable  Respiratory system: Clear to auscultation. Respiratory effort normal. Cardiovascular system: S1 & S2 heard, RRR. No JVD, murmurs, rubs, gallops or clicks. No pedal edema. Gastrointestinal system: Abdomen is nondistended, soft and nontender. No organomegaly or masses felt. Normal bowel sounds heard. Central nervous system: Alert and oriented. No focal neurological deficits. Extremities: Symmetric 5 x 5 power. Skin: No rashes, lesions or ulcers   Data Reviewed: I have personally reviewed following labs and imaging studies  CBC: Recent Labs  Lab 07/17/19 0448 07/17/19 1311 07/17/19 1835 07/18/19 0047 07/18/19 0708  WBC 5.4  --   --   --   --   NEUTROABS 2.9  --   --   --   --   HGB 9.2* 8.5* 8.4* 8.3* 8.0*  HCT 31.2* 30.1* 29.0* 28.7* 27.7*  MCV 76.8*  --   --   --   --   PLT 409*  --   --   --   --    Basic Metabolic Panel: Recent Labs  Lab 07/17/19 0448 07/18/19 0947 07/18/19 1355  NA 129* 134*  --   K 4.9 4.3  --   CL 94* 99  --   CO2 23 25  --   GLUCOSE 581* 306*  --   BUN 23* 14  --   CREATININE 1.00 1.00  --   CALCIUM 9.4 8.8*  --   MG 1.9  --  2.0   GFR: Estimated Creatinine Clearance: 103.2 mL/min (by C-G formula based on SCr of 1 mg/dL). Liver Function Tests: Recent Labs  Lab 07/17/19 0448  AST 24  ALT 15  ALKPHOS 100  BILITOT 0.1*  PROT 8.0  ALBUMIN 3.6   Recent Labs  Lab 07/17/19 0448  LIPASE 91*   No results for input(s): AMMONIA in the last 168 hours. Coagulation Profile: Recent Labs  Lab 07/17/19 0448  INR 1.0   Cardiac Enzymes: No results for input(s): CKTOTAL, CKMB, CKMBINDEX, TROPONINI in the last 168 hours. BNP (last 3 results) No results for input(s): PROBNP in the  last 8760 hours. HbA1C: Recent Labs    07/17/19 1149  HGBA1C 7.2*   CBG: Recent Labs  Lab 07/17/19 1204 07/17/19 1715 07/17/19 1937 07/18/19 0715 07/18/19 1130  GLUCAP 187* 232* 212* 232* 304*   Lipid Profile: No results for input(s): CHOL, HDL, LDLCALC, TRIG, CHOLHDL, LDLDIRECT in the last 72 hours. Thyroid Function Tests: No results for input(s): TSH, T4TOTAL, FREET4, T3FREE, THYROIDAB in the last 72 hours. Anemia Panel: No results for input(s): VITAMINB12, FOLATE, FERRITIN, TIBC, IRON, RETICCTPCT in the last 72 hours. Sepsis Labs: No results for input(s): PROCALCITON, LATICACIDVEN in the last 168 hours.  Recent Results (from the past 240 hour(s))  Respiratory Panel by RT PCR (Flu A&B, Covid) - Nasal Swab (BD Veritor Kit)     Status: None   Collection Time: 07/17/19  7:25 AM   Specimen: Nasal Swab (BD Veritor Kit)  Result Value Ref Range Status   SARS Coronavirus 2 by RT PCR NEGATIVE NEGATIVE Final  Comment: (NOTE) SARS-CoV-2 target nucleic acids are NOT DETECTED. The SARS-CoV-2 RNA is generally detectable in upper respiratoy specimens during the acute phase of infection. The lowest concentration of SARS-CoV-2 viral copies this assay can detect is 131 copies/mL. A negative result does not preclude SARS-Cov-2 infection and should not be used as the sole basis for treatment or other patient management decisions. A negative result may occur with  improper specimen collection/handling, submission of specimen other than nasopharyngeal swab, presence of viral mutation(s) within the areas targeted by this assay, and inadequate number of viral copies (<131 copies/mL). A negative result must be combined with clinical observations, patient history, and epidemiological information. The expected result is Negative. Fact Sheet for Patients:  PinkCheek.be Fact Sheet for Healthcare Providers:  GravelBags.it This test is  not yet ap proved or cleared by the [place] FDA and  has been authorized for detection and/or diagnosis of SARS-CoV-2 by FDA under an Emergency Use Authorization (EUA). This EUA will remain  in effect (meaning this test can be used) for the duration of the COVID-19 declaration under Section 564(b)(1) of the Act, 21 U.S.C. section 360bbb-3(b)(1), unless the authorization is terminated or revoked sooner.    Influenza A by PCR NEGATIVE NEGATIVE Final   Influenza B by PCR NEGATIVE NEGATIVE Final    Comment: (NOTE) The Xpert Xpress SARS-CoV-2/FLU/RSV assay is intended as an aid in  the diagnosis of influenza from Nasopharyngeal swab specimens and  should not be used as a sole basis for treatment. Nasal washings and  aspirates are unacceptable for Xpert Xpress SARS-CoV-2/FLU/RSV  testing. Fact Sheet for Patients: PinkCheek.be Fact Sheet for Healthcare Providers: GravelBags.it This test is not yet approved or cleared by the [place] FDA and  has been authorized for detection and/or diagnosis of SARS-CoV-2 by  FDA under an Emergency Use Authorization (EUA). This EUA will remain  in effect (meaning this test can be used) for the duration of the  Covid-19 declaration under Section 564(b)(1) of the Act, 21  U.S.C. section 360bbb-3(b)(1), unless the authorization is  terminated or revoked. Performed at [facility], [address] [place], [place] [address]   SARS Coronavirus 2 Ag (30 min TAT) - Nasal Swab (BD Veritor Kit)     Status: None   Collection Time: 07/17/19  7:25 AM   Specimen: Nasal Swab (BD Veritor Kit)  Result Value Ref Range Status   SARS Coronavirus 2 Ag NEGATIVE NEGATIVE Final    Comment: (NOTE) SARS-CoV-2 antigen NOT DETECTED.  Negative results are presumptive.  Negative results do not preclude SARS-CoV-2 infection and should not be used as the sole basis for treatment or other patient  management decisions, including infection  control decisions, particularly in the presence of clinical signs and  symptoms consistent with COVID-19, or in those who have been in contact with the virus.  Negative results must be combined with clinical observations, patient history, and epidemiological information. The expected result is Negative. Fact Sheet for Patients: PodPark.tn Fact Sheet for Healthcare Providers: GiftContent.is This test is not yet approved or cleared by the [place] FDA and  has been authorized for detection and/or diagnosis of SARS-CoV-2 by FDA under an Emergency Use Authorization (EUA).  This EUA will remain in effect (meaning this test can be used) for the duration of  the COVID-19 de claration under Section 564(b)(1) of the Act, 21 U.S.C. section 360bbb-3(b)(1), unless the authorization is terminated or revoked sooner. Performed at [facility] [address] [place], [place] [address]          Radiology Studies: CT ABDOMEN PELVIS W CONTRAST  Addendum Date: 07/17/2019   ADDENDUM REPORT: 07/17/2019 06:59 ADDENDUM: Following further discussion with the ordering provider, the larger pancreatic collection with debris noted in the superior aspect is notably positioned along the greater curvature of the stomach. There is loss of discernible fat plane. It is difficult to ascertain whether there is developing fistulization or direct involvement of the greater curvature of the stomach though this is certainly a possibility given reported history of 'black' emesis which could reflect either bile, blood or necrosis. Consider urgent GI consultation and consideration of endoscopy. These results were called by telephone at the time of interpretation on 07/17/2019 at 6:59 am to provider [facility] , who verbally acknowledged these results. Electronically Signed   By: Lovena Le M.D.   On: 07/17/2019  06:59   Result Date: 07/17/2019 CLINICAL DATA:  Generalized abdominal pain, recent hospitalization to [facility] for necrotizing pancreatitis. EXAM: CT ABDOMEN AND PELVIS WITH CONTRAST TECHNIQUE: Multidetector CT imaging of the abdomen and pelvis was performed using the standard protocol following bolus administration of intravenous contrast. CONTRAST:  114m OMNIPAQUE IOHEXOL 300 MG/ML  SOLN COMPARISON:  CT chest 08/04/2018 and CT abdomen pelvis performed 06/21/2019 at outside facility (report only) FINDINGS: Lower chest: Atelectatic changes are present in the lung bases. Normal heart size. No pericardial effusion. Hepatobiliary: No focal liver abnormality is seen. No gallstones, gallbladder wall thickening or pericholecystic inflammation. Prominent fold at the gallbladder neck is similar to comparison. No intra or extrahepatic biliary ductal dilatation. Pancreas: There are extensive hypoattenuating parenchymal replacing collections involving the pancreatic head body and tail, largest is a multiloculated collection extending from the head to pancreatic body measuring up to 7.9 x 12.2 x 10.4 cm in size with some intermediate attenuation possibly reflecting hemorrhagic debris or proteinaceous material (5/38, 2/48). Additional lobular collection measuring approximately 5.2 x 6.7 x 6.3 cm involving the pancreatic tail. Extensive peripancreatic inflammation is seen. Per outside report dated 06/21/2019 the pancreatic parenchyma previously demonstrated areas of hypoattenuation compatible with necrotizing pancreatitis. Given time course from prior imaging these collections are most compatible with acute necrotic collections. Nonvisualization of the pancreatic duct slight narrowing of the splenic vein without complete occlusion or thrombus formation. Spleen: Normal in size without focal abnormality. Adrenals/Urinary Tract: Normal adrenal glands. Bilateral diffuse medullary calcification similar to comparison studies. Stable mild  bilateral symmetric perinephric stranding, a nonspecific finding though may correlate with either age or decreased renal function. Kidneys are otherwise unremarkable, without renal calculi, suspicious lesion, or hydronephrosis. Bladder is unremarkable. Stomach/Bowel: Transesophageal tube terminates within the proximal jejunum beyond the ligament of Treitz. Distal esophagus, stomach and duodenal sweep are unremarkable. Several loops of small bowel demonstrate some edematous thickening with adjacent inflammatory changes likely arising from the pancreatic process detailed above. No evidence of bowel obstruction. Postsurgical changes at the cecal tip compatible with prior appendectomy. There is segmental thickening of the splenic flexure as it comes in close proximity to the tail of the pancreas. No resulting obstruction. Scattered colonic diverticula without focal pericolonic inflammation to suggest diverticulitis. Vascular/Lymphatic: Atherosclerotic plaque within the normal caliber aorta. Reactive adenopathy in the upper abdomen with edematous retroperitoneal and intraperitoneal changes, as detailed above. Reproductive: Anteverted uterus. No concerning adnexal lesions. Other: Intraperitoneal and retroperitoneal inflammatory changes centered upon the pancreatic process as detailed above. Small amount of inflammatory retroperitoneal fluid is noted. No free air. No bowel containing hernias.  Soft tissue nodularity in the low anterior abdomen subcutaneous compatible with injectable use such as insulin or anticoagulation. Musculoskeletal: Multilevel degenerative changes are present throughout the thoracic and lumbar spine. 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 IMPRESSION: 1. Extensive large intrapancreatic and peripancreatic fluid collections associated with  the edematous and necrotic pancreas in the setting of known necrotizing pancreatitis. Given time course from the initial imaging (less than 4 weeks) these collections are most compatible with acute necrotic collection per revised [place] criteria. 2. Slight compression of the splenic vein without demonstrable splenic vein thrombus. 3. Extensive intraperitoneal and retroperitoneal inflammatory changes secondary to the pancreatic inflammation above. Associated reactive thickening of both the small bowel and splenic flexure. No resulting evidence of bowel obstruction. 4. Bilateral medullary nephrocalcinosis, similar to comparison studies. 5. Colonic diverticulosis without evidence of diverticulitis. 6. 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 7. Soft tissue nodularity in the low anterior abdomen subcutaneous tissues compatible with injectable use such as insulin or anticoagulation. 8. Aortic Atherosclerosis (ICD10-I70.0). Electronically Signed: By: Lovena Le M.D. On: 07/17/2019 06:40        Scheduled Meds: . amitriptyline  100 mg Oral QHS  . amitriptyline  50 mg Oral QHS  . [START ON 07/19/2019] fluconazole  200 mg Oral Daily  . insulin aspart  0-5 Units Subcutaneous QHS  . insulin aspart  0-9 Units Subcutaneous TID WC  . insulin glargine  15 Units Subcutaneous QHS  . lamoTRIgine  200 mg Oral Daily  . lurasidone  40 mg Oral QHS  . mometasone-formoterol  2 puff Inhalation BID  . OLANZapine  10 mg Oral QHS  . ondansetron (ZOFRAN) IV  4 mg Intravenous Q6H   Or  . ondansetron  4 mg Oral Q6H  . pantoprazole (PROTONIX) IV  40 mg Intravenous Q12H  . senna  1 tablet Oral BID  . sodium chloride flush  3 mL Intravenous Q12H  . traZODone  100 mg Oral QHS   Continuous Infusions: . sodium chloride       LOS: 1 day    Time spent: 35 minutes.     Elmarie Shiley, MD [facility]   If 7PM-7AM, please contact night-coverage [email or web address]  07/18/2019, 3:24 PM

## 2019-07-18 NOTE — Op Note (Signed)
[facility] Patient Name: Monique Holland Procedure Date: 07/18/2019 MRN: [id number] Attending MD: Doristine Locks , MD Date of Birth: [birth year] CSN: [id number] Age: 54 Admit Type: Inpatient Procedure:                Upper GI endoscopy Indications:              Coffee-ground emesis, Nausea with vomiting                           54 yo female with recent prolonged admission to [facility]                            with pancreatitis/pancreatic pseudocysts. NJ tube                            placed. Now admitted with coffee ground emesis,                            nausea/vomiting, and anemia, with admission Hgb                            9.2, and 8.0 this morning (10.5 baseline in                            02/2019; discharged at 7 from [facility] earlier this                            month). No melena. Providers:                Doristine Locks, MD, Dwain Sarna, RN, Everardo Pacific, Technician, Waymond Cera, CRNA Referring MD:              Medicines:                Monitored Anesthesia Care Complications:            No immediate complications. Estimated Blood Loss:     Estimated blood loss was minimal. Procedure:                Pre-Anesthesia Assessment:                           - Prior to the procedure, a History and Physical                            was performed, and patient medications and                            allergies were reviewed. The patient's tolerance of                            previous anesthesia was also reviewed. The risks                            and  benefits of the procedure and the sedation                            options and risks were discussed with the patient.                            All questions were answered, and informed consent                            was obtained. Prior Anticoagulants: The patient has                            taken no previous anticoagulant or antiplatelet                            agents.  ASA Grade Assessment: III - A patient with                            severe systemic disease. After reviewing the risks                            and benefits, the patient was deemed in                            satisfactory condition to undergo the procedure.                           After obtaining informed consent, the endoscope was                            passed under direct vision. Throughout the                            procedure, the patient's blood pressure, pulse, and                            oxygen saturations were monitored continuously. The                            [id number]-[id number] ([id number] Olympus gastroscope was                            introduced through the mouth, and advanced to the                            second part of duodenum. The upper GI endoscopy was                            accomplished without difficulty. The patient                            tolerated the procedure well. Scope In: Scope Out: Findings:      Diffuse, white plaques were found in the entire esophagus. Biopsies  were       taken with a cold forceps for histology. Estimated blood loss was       minimal.      The Z-line was slightly irregular, characterized by mild inflammation       and was found 45 cm from the incisors. No bleeding noted.      Localized moderate inflammation characterized by congestion (edema) and       erythema was found in the gastric antrum and in the prepyloric region of       the stomach. No ulcers, erosions, or bleeding. Biopsies were taken with       a cold forceps for Helicobacter pylori testing. Estimated blood loss was       minimal.      The gastric fundus, gastric body and incisura were normal. Additional       biopsies were taken with a cold forceps for Helicobacter pylori testing.       Estimated blood loss was minimal.      The ampulla, duodenal bulb, first portion of the duodenum and second       portion of the duodenum were normal. The NJ tube was seen  coursing deep       into the small bowel beyond the extent reached today. Tube was in place       and position unchanged after removal of endoscope. Impression:               - Esophageal plaques were found, suspicious for                            candidiasis. Biopsied.                           - Z-line mildly irregular consistent with mild                            inflammation but no bleeding. Suspect some degree                            of inflammation from NJT irritation, reflux, and                            recent nausea/vomiting.                           - Gastritis. Biopsied.                           - Normal gastric fundus, gastric body and incisura.                            Biopsied.                           - Normal ampulla, duodenal bulb, first portion of                            the duodenum and second portion of the duodenum. Moderate Sedation:      Not Applicable - Patient had care per Anesthesia. Recommendation:           -  Return patient to hospital ward for ongoing care.                           - Clear liquid diet today and continue to advance                            PO intake as tolerated.                           - Ok to resume NJ tube feeds.                           - Continue present medications.                           - Await pathology results.                           - Diflucan (fluconazole) 400 mg x1 today then 200                            mg PO daily for 3 weeks.                           - Resume antiemetics.                           - Resume acid suppression therapy.                           - Results discussed with patient's son over the                            phone. Procedure Code(s):        --- Professional ---                           [phone], Esophagogastroduodenoscopy, flexible,                            transoral; with biopsy, single or multiple Diagnosis Code(s):        --- Professional ---                            K22.9, Disease of esophagus, unspecified                           K22.8, Other specified diseases of esophagus                           K29.70, Gastritis, unspecified, without bleeding                           K92.0, Hematemesis                           R11.2, Nausea with vomiting, unspecified CPT copyright 2019  American Medical Association. All rights reserved. The codes documented in this report are preliminary and upon coder review may  be revised to meet current compliance requirements. Doristine Locks, MD 07/18/2019 9:13:32 AM Number of Addenda: 0

## 2019-07-18 NOTE — Transfer of Care (Signed)
Immediate Anesthesia Transfer of Care Note  Patient: Monique Holland  Procedure(s) Performed: ESOPHAGOGASTRODUODENOSCOPY (EGD) WITH PROPOFOL (N/A ) BIOPSY  Patient Location: Endoscopy Unit  Anesthesia Type:MAC  Level of Consciousness: drowsy and patient cooperative  Airway & Oxygen Therapy: Patient Spontanous Breathing and Patient connected to nasal cannula oxygen  Post-op Assessment: Report given to RN and Post -op Vital signs reviewed and stable  Post vital signs: Reviewed and stable  Last Vitals:  Vitals Value Taken Time  BP 134/71 07/18/19 0900  Temp 36.7 C 07/18/19 0900  Pulse 111 07/18/19 0901  Resp 17 07/18/19 0901  SpO2 100 % 07/18/19 0901  Vitals shown include unvalidated device data.  Last Pain:  Vitals:   07/18/19 0900  TempSrc: Oral  PainSc: 0-No pain      Patients Stated Pain Goal: 0 (28/20/81 3887)  Complications: No apparent anesthesia complications

## 2019-07-18 NOTE — Progress Notes (Signed)
Pt states she is on Nutren 1.5 nocturnally only (6pm-8am). She eats a regular diet at home during the day.

## 2019-07-18 NOTE — Anesthesia Postprocedure Evaluation (Signed)
Anesthesia Post Note  Patient: Monique Holland  Procedure(s) Performed: ESOPHAGOGASTRODUODENOSCOPY (EGD) WITH PROPOFOL (N/A ) BIOPSY     Patient location during evaluation: PACU Anesthesia Type: MAC Level of consciousness: awake and alert Pain management: pain level controlled Vital Signs Assessment: post-procedure vital signs reviewed and stable Respiratory status: spontaneous breathing, nonlabored ventilation, respiratory function stable and patient connected to nasal cannula oxygen Cardiovascular status: stable and blood pressure returned to baseline Postop Assessment: no apparent nausea or vomiting Anesthetic complications: no    Last Vitals:  Vitals:   07/18/19 0900 07/18/19 0910  BP: 134/71 140/84  Pulse: (!) 112 (!) 105  Resp: 15 15  Temp: 36.7 C   SpO2: 100% 98%    Last Pain:  Vitals:   07/18/19 0910  TempSrc:   PainSc: 0-No pain                 Dajai Wahlert DAVID

## 2019-07-18 NOTE — Interval H&P Note (Signed)
History and Physical Interval Note:  07/18/2019 8:24 AM  Monique Holland  has presented today for surgery, with the diagnosis of Hematemesis.  The various methods of treatment have been discussed with the patient and family. After consideration of risks, benefits and other options for treatment, the patient has consented to  Procedure(s): ESOPHAGOGASTRODUODENOSCOPY (EGD) WITH PROPOFOL (N/A) as a surgical intervention.  The patient's history has been reviewed, patient examined, no change in status, stable for surgery.  I have reviewed the patient's chart and labs.  Questions were answered to the patient's satisfaction.     Verlin Dike Sabriya Yono

## 2019-07-18 NOTE — Anesthesia Preprocedure Evaluation (Signed)
Anesthesia Evaluation  Patient identified by MRN, date of birth, ID band Patient awake    Reviewed: Allergy & Precautions, NPO status , Patient's Chart, lab work & pertinent test results  Airway Mallampati: I  TM Distance: >3 FB Neck ROM: Full    Dental   Pulmonary sleep apnea ,    Pulmonary exam normal        Cardiovascular hypertension, Pt. on medications Normal cardiovascular exam     Neuro/Psych Anxiety Depression Schizophrenia    GI/Hepatic GERD  Medicated and Controlled,  Endo/Other  diabetes, Type 2  Renal/GU      Musculoskeletal   Abdominal   Peds  Hematology   Anesthesia Other Findings   Reproductive/Obstetrics                             Anesthesia Physical Anesthesia Plan  ASA: III  Anesthesia Plan: MAC   Post-op Pain Management:    Induction:   PONV Risk Score and Plan: 2 and Treatment may vary due to age or medical condition  Airway Management Planned: Nasal Cannula  Additional Equipment:   Intra-op Plan:   Post-operative Plan:   Informed Consent: I have reviewed the patients History and Physical, chart, labs and discussed the procedure including the risks, benefits and alternatives for the proposed anesthesia with the patient or authorized representative who has indicated his/her understanding and acceptance.       Plan Discussed with: CRNA and Surgeon  Anesthesia Plan Comments:         Anesthesia Quick Evaluation

## 2019-07-19 ENCOUNTER — Telehealth: Payer: Self-pay

## 2019-07-19 ENCOUNTER — Other Ambulatory Visit: Payer: Self-pay

## 2019-07-19 DIAGNOSIS — R112 Nausea with vomiting, unspecified: Secondary | ICD-10-CM

## 2019-07-19 DIAGNOSIS — K863 Pseudocyst of pancreas: Secondary | ICD-10-CM

## 2019-07-19 DIAGNOSIS — B3781 Candidal esophagitis: Secondary | ICD-10-CM

## 2019-07-19 LAB — CBC
HCT: 28.5 % — ABNORMAL LOW (ref 36.0–46.0)
Hemoglobin: 8.2 g/dL — ABNORMAL LOW (ref 12.0–15.0)
MCH: 22.8 pg — ABNORMAL LOW (ref 26.0–34.0)
MCHC: 28.8 g/dL — ABNORMAL LOW (ref 30.0–36.0)
MCV: 79.2 fL — ABNORMAL LOW (ref 80.0–100.0)
Platelets: 328 10*3/uL (ref 150–400)
RBC: 3.6 MIL/uL — ABNORMAL LOW (ref 3.87–5.11)
RDW: 15.8 % — ABNORMAL HIGH (ref 11.5–15.5)
WBC: 4.9 10*3/uL (ref 4.0–10.5)
nRBC: 0 % (ref 0.0–0.2)

## 2019-07-19 LAB — GLUCOSE, CAPILLARY
Glucose-Capillary: 202 mg/dL — ABNORMAL HIGH (ref 70–99)
Glucose-Capillary: 265 mg/dL — ABNORMAL HIGH (ref 70–99)
Glucose-Capillary: 317 mg/dL — ABNORMAL HIGH (ref 70–99)
Glucose-Capillary: 231 mg/dL — ABNORMAL HIGH (ref 70–99)

## 2019-07-19 LAB — BASIC METABOLIC PANEL
Anion gap: 13 (ref 5–15)
BUN: 11 mg/dL (ref 6–20)
CO2: 24 mmol/L (ref 22–32)
Calcium: 8.9 mg/dL (ref 8.9–10.3)
Chloride: 100 mmol/L (ref 98–111)
Creatinine, Ser: 0.83 mg/dL (ref 0.44–1.00)
GFR calc Af Amer: >60 mL/min (ref >60)
GFR calc non Af Amer: >60 mL/min (ref >60)
Glucose, Bld: 264 mg/dL — ABNORMAL HIGH (ref 70–99)
Potassium: 4.6 mmol/L (ref 3.5–5.1)
Sodium: 137 mmol/L (ref 135–145)

## 2019-07-19 LAB — SURGICAL PATHOLOGY

## 2019-07-19 MED ORDER — ADULT MULTIVITAMIN W/MINERALS CH
1.0000 | ORAL_TABLET | Freq: Every day | ORAL | Status: DC
  Administered 2019-07-19 – 2019-07-21 (×3): 1 via ORAL
  Filled 2019-07-19 (×3): qty 1

## 2019-07-19 MED ORDER — PANTOPRAZOLE SODIUM 40 MG PO TBEC
40.0000 mg | DELAYED_RELEASE_TABLET | Freq: Two times a day (BID) | ORAL | Status: DC
  Administered 2019-07-19 – 2019-07-21 (×5): 40 mg via ORAL
  Filled 2019-07-19 (×5): qty 1

## 2019-07-19 MED ORDER — ENSURE MAX PROTEIN PO LIQD
11.0000 [oz_av] | Freq: Every day | ORAL | Status: DC
  Administered 2019-07-19 – 2019-07-20 (×2): 11 [oz_av] via ORAL
  Filled 2019-07-19 (×3): qty 330

## 2019-07-19 NOTE — Progress Notes (Signed)
Initial Nutrition Assessment  DOCUMENTATION CODES:   Morbid obesity  INTERVENTION:  If patient should not tolerate po intake and nocturnal feeds are resumed, recommend 1000 ml Osmolite 1.5 from 1800 - 0600 (6pm-6am) -Provides 1500 kcal, 63 grams of protein, and 762 ml free water  Ensure Max po daily, each supplement provides 150 kcal and 30 grams of protein  MVI with minerals daily  Education  NUTRITION DIAGNOSIS:   Inadequate oral intake related to altered GI function(necrotizing pancreatitis) as evidenced by per patient/family report, energy intake < or equal to 75% for > or equal to 1 month(NJT for nocturanl tube feedings).   GOAL:   Patient will meet greater than or equal to 90% of their needs   MONITOR:   PO intake, Labs, Weight trends, I & O's, Diet advancement  REASON FOR ASSESSMENT:   Consult Enteral/tube feeding initiation and management  ASSESSMENT:   54 year old female transferred from [facility] [place] [facility] with hematemesis. Patient with past medical history of bipolar disorder, schizophrenia, IDDM2, HTN, recent prolonged hospitalization at [facility] 1/26-2/17 with severe acute necrotizing pancreatitis, nonocclusive splenic thrombosis, hypercalcemia secondary to hyperparathyroidism and is s/p parathyroidectomy on 2/11 discharged with nocturnal tube feeds via NJT. Patient presented to ED with complaints of very poor po intake, multiple episodes of emesis daily, noted 1 episode of vomiting dark blood, and no bowel movements for multiple days.  Patient admitted for necrotizing pancreatitis  2/25 EGD - candida esophagitis, gastritis, gastroduodenitis.  Per GI, possible need for endoscopic cystogastrostomy for pancreatic fluid collection if it does not resolve. Resume nocturnal tube feedings. Home TF regimen:  Nutren 1.5 1000 ml 6pm - 6am with prosource 60 ml daily via tube -Provides 1700 kcal, 80 grams of protein, and 775 ml H20 daily  Patient eating 100% x 2  documented meals since soft diet advancement. Patient out of bed, stretched out on window bench this morning and reports feeling much better today. She endorses 100% of french toast, sausage, and eggs this morning for breakfast. She denies nausea, vomiting, abdominal discomfort after eating, reports looking forward to the cheeseburger   that she has ordered a for lunch. Patient stated that she has not had real food in almost 2 months and feels that the TF were upsetting her stomach, reports that NJT was going to be removed today. Given patient improving po intake and tolerating diet, do not feel that patient needs supplemental tube feedings. Recommendations have been provided above should patient have decreased po intake or is unable to tolerate diet order.   Patient recalls usually intake of 1 meal/day. She stated that she hardly ever eats breakfast and recalls "soul food" for dinner (fried pots, chicken, hamburgers, [place]isbury steak and gravy, string beans, cabbage, lima beans) She reports cooking with an air fryer, seasons foods with garlic powder and accent.   RD educated on eating 4-6 smaller meals throughout the day verses one large meal for better glucose control and over consumption of food when eating 1 meal/day that promotes unhealthy weight. Obesity is a complex, chronic medical condition that is optimally managed by a multidisciplinary care team. Weight loss is not an ideal goal for an acute inpatient hospitalization. However, if further work-up for obesity is warranted, consider outpatient referral to outpatient bariatric service and/or [place].   Mild pitting generalized edema noted per RN assessment.  Current wt 312.4 lbs UBW 330 lbs per pt Weight history reviewed, 152.9 kg - 142 kg over the  past year.  Medications reviewed and include: Diflucan, SS novolog, Lantus, Protonix, Senokot  Labs: CBGs 202, 225 x 24 hrs Lab Results  Component  Value Date   HGBA1C 7.2 (H) 07/17/2019    NUTRITION - FOCUSED PHYSICAL EXAM: 2/26 Findings: Mild fat depletion to upper arm, buccal regions, Moderate fat depletion to orbital region; Mild muscle depletion to temple, clavicle, dorsal hand regions.    Diet Order:   Diet Order            DIET SOFT Room service appropriate? Yes; Fluid consistency: Thin  Diet effective now              EDUCATION NEEDS:   Education needs have been addressed  Skin:  Skin Assessment: Reviewed RN Assessment  Last BM:  2/21  Height:   Ht Readings from Last 1 Encounters:  07/18/19 5\' 11"  (1.803 m)    Weight:   Wt Readings from Last 1 Encounters:  07/18/19 (!) 142 kg    Ideal Body Weight:  70.4 kg  BMI:  Body mass index is 43.65 kg/m.  Estimated Nutritional Needs:   Kcal:  2100-2300  Protein:  105-115  Fluid:  >/= 2.1 L/day   Lajuan Lines, RD, LDN Clinical Nutrition After Hours/Weekend Pager # in [place]

## 2019-07-19 NOTE — Telephone Encounter (Signed)
-----   Message from [place] V, DO sent at 07/19/2019 12:54 PM EST ----- Regarding: f/u appt Need f/u OV with me in approx 4 weeks. Thanks.

## 2019-07-19 NOTE — Care Management Important Message (Signed)
Important Message  Patient Details IM Letter given to [name] RN Case Manager to present to the Patient Name: Monique Holland MRN: [id number] Date of Birth: Sep 23, 1965   Medicare Important Message Given:  Yes     Caren Macadam 07/19/2019, 9:33 AM

## 2019-07-19 NOTE — Progress Notes (Signed)
Patient ID: Monique Holland, female   DOB: Oct 04, 1965, 54 y.o.   MRN: [id number]    Progress Note   Subjective   day #3 CC; recent severe necrotizing pancreatitis, persistent nausea vomiting and hematemesis  EGD yesterday-diffuse white plaques in the esophagus consistent with candidiasis moderate gastritis, no ulcers, normal duodenum-biopsies done  WBC 4.9/hemoglobin 8.2 stable Glucose 264  Patient says she is feeling better, she was able to tolerate solid food last night and this morning and in fact ate a cheeseburger and fries last night.  No complaints of nausea or vomiting and no significant abdominal pain. She is asking about having the nasoenteric tube removed   Objective   Vital signs in last 24 hours: Temp:  [98.3 F (36.8 C)-98.7 F (37.1 C)] 98.3 F (36.8 C) (02/26 0528) Pulse Rate:  [99-109] 109 (02/26 0528) Resp:  [15-19] 19 (02/26 0528) BP: (131-145)/(77-100) 135/77 (02/26 0528) SpO2:  [96 %-98 %] 96 % (02/26 0528) Last BM Date: 07/14/19 General:    white female in NAD sitting on side of the bed Heart:  Regular rate and rhythm; no murmurs Lungs: Respirations even and unlabored, lungs CTA bilaterally Abdomen:  Soft, nontender and nondistended. Normal bowel sounds. Extremities:  Without edema. Neurologic:  Alert and oriented,  grossly normal neurologically. Psych:  Cooperative. Normal mood and affect.  Intake/Output from previous day: 02/25 0701 - 02/26 0700 In: 400 [I.V.:400] Out: -  Intake/Output this shift: Total I/O In: 3 [I.V.:3] Out: -   Lab Results: Recent Labs    07/17/19 0448 07/17/19 1311 07/18/19 0047 07/18/19 0708 07/19/19 0435  WBC 5.4  --   --   --  4.9  HGB 9.2*   < > 8.3* 8.0* 8.2*  HCT 31.2*   < > 28.7* 27.7* 28.5*  PLT 409*  --   --   --  328   < > = values in this interval not displayed.   BMET Recent Labs    07/17/19 0448 07/18/19 0947 07/19/19 0435  NA 129* 134* 137  K 4.9 4.3 4.6  CL 94* 99 100  CO2 23 25 24    GLUCOSE 581* 306* 264*  BUN 23* 14 11  CREATININE 1.00 1.00 0.83  CALCIUM 9.4 8.8* 8.9   LFT Recent Labs    07/17/19 0448  PROT 8.0  ALBUMIN 3.6  AST 24  ALT 15  ALKPHOS 100  BILITOT 0.1*   PT/INR Recent Labs    07/17/19 0448  LABPROT 12.8  INR 1.0    Studies/Results: No results found.     Assessment / Plan:    #62 54 year old female admitted with nausea vomiting and coffee-ground emesis, in setting of recent prolonged hospitalization at [facility] for severe necrotizing pancreatitis. Etiology of pancreatitis possibly secondary to hypercalcemia versus medication induced. Patient has developed multiple pseudocysts.  EGD yesterday revealed Candida esophagitis, and moderate gastritis.  No active bleeding  2 insulin-dependent diabetes mellitus 3 morbid obesity #4 bipolar disorder/schizophrenia  Plan; soft low-fat diet Convert antiemetics to oral and continue around-the-clock as this seems to be very effective. Continue twice daily Protonix p.o. Continue Diflucan, complete a 14-day course  If patient does well today, can tolerate solid diet without difficulty and oral antiemetics are effective, we probably can remove her naso-enteric tube. Please she will be able to be discharged this weekend. We will plan outpatient follow-up with Dr[facility].  She will need reimaging of her pancreas in 4 to 6 weeks.  Plan  Active Problems:   Acute pancreatitis   Nausea and vomiting in adult   Necrotizing pancreatitis   Gastritis and gastroduodenitis   Candida esophagitis (HCC)     LOS: 2 days   Macsen Nuttall PA-C 07/19/2019, 9:02 AM

## 2019-07-19 NOTE — Telephone Encounter (Signed)
Patient has been scheduled for a f/u OV on 09/03/2019 at 9:30 am at the [facility] office; patient is still currently inpatient at this time; will follow up with patient at a later date/time concerning this appt;

## 2019-07-19 NOTE — Progress Notes (Signed)

## 2019-07-19 NOTE — Progress Notes (Signed)
PROGRESS NOTE    Monique Holland  BVA:[id number] DOB: August 19, 1965 DOA: 07/17/2019 PCP: Kristie Cowman, MD   Brief Narrative: 54 year old with past medical history significant for diabetes type 2, hypertension, morbid obese, OSA, hyperparathyroidism, nephrolithiasis, schizophrenia, who presents to the ED complaining of vomiting dark-colored red emesis with bloody streaks.  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  She underwent NJ feeding placement for nutrition.  She was discharged with Dr. Clerance Lav tube feeding and sips of water by mouth for comfort.  During that hospitalization she was also found to have hypercalcemia secondary to primary hyperparathyroidism.  CT chest showed parathyroid adenoma.  Patient underwent left left parathyroidectomy. Evaluation in the ED CT abdomen pelvis show extensive large intrapancreatic and peripancreatic fluid collection associated with erythematosus necrosis of the pancreas.  Slight compression of the splenic vein without demonstrable splenic vein thrombosis  Assessment & Plan:   Active Problems:   Acute pancreatitis   Nausea and vomiting in adult   Necrotizing pancreatitis   Gastritis and gastroduodenitis   Candida esophagitis (HCC)   Pancreatic pseudocyst  1-Coffee-ground emesis: Patient underwent endoscopy on 12/25 which showed diffuse plaque entire esophagus.  No bleeding noted.  Moderate inflammation and erythema in the gastric atrium.  Not ulcers or erosion. Started on Diflucan for Candida esophagitis. Hemoglobin remained stable. Continue with PPI. Advance diet as tolerated.   2-recent history of acute necrotizing pancreatitis: Patient has [place] for nocturnal tube feedings.  Per GI okay to resume nocturnal tube feedings. Advanced diet. If tolerates diet might be able to remove NJ tuibe Per GI  patient might need in the future endoscopic cystogastrostomy for pancreatic fluid collection if this does not resolve.  3-type 2 diabetes: Continue with Lantus and sliding scale insulin adjust as needed.  Hypertension: Continue with losartan.  Psychiatric disorder: Schizophrenia, bipolar: Continue with amitriptyline Lamictal olanzapine lurasidone.  Stopped Zoloft she reported she had not been taking this medication. Monitor QT closely now that she is on Diflucan as well.  Hypercalcemia, hyperparathyroidism, history of nephrolithiasis: During her recent hospitalization she was found to have hypercalcemia secondary to primary hyperparathyroidism.  She underwent went parathyroidectomy. Continue to monitor calcium level.  OSA resume CPAP  Morbid obesity: Need diet and exercise education Prolonged QT: Mg normal. QT improved.   Estimated body mass index is 43.65 kg/m as calculated from the following:   Height as of this encounter: 5' 11"  (1.803 m).   Weight as of this encounter: 142 kg.   DVT prophylaxis: SCDs, no anticoagulation due to GI bleed Code Status: Full code Family Communication: Care discussed with patient Disposition Plan:  Patient is from: Home Anticipated d/c date: 1 or 2 days if she is able to tolerate diet Barriers to d/c or necessity for inpatient status: Post endoscopy today for evaluation of melena.  Need to monitor hemoglobin and make sure patient is able to tolerate diet  Consultants:   GI  Procedures:   Endoscopy  Antimicrobials:    Subjective: Feeling better, tolerating diet Objective: Vitals:   07/18/19 2145 07/19/19 0528 07/19/19 1345 07/19/19 1432  BP: (!) 145/100 135/77 (!) 149/89   Pulse: 99 (!) 109 (!) 120 (!) 108  Resp: 16 19 19    Temp: 98.7 F (37.1 C) 98.3 F (36.8 C) 98.7 F (37.1 C)   TempSrc: Oral Oral Oral   SpO2: 96% 96% 98%   Weight:  Height:        Intake/Output Summary (Last 24 hours) at 07/19/2019 1644 Last data  filed at 07/19/2019 1400 Gross per 24 hour  Intake 483 ml  Output --  Net 483 ml   Filed Weights   07/17/19 0438 07/18/19 0749  Weight: (!) 142 kg (!) 142 kg    Examination:  General exam: NAD Respiratory system: CTA Cardiovascular system: S 1, S 2 RRR Gastrointestinal system: BS present, soft, nt Central nervous system: Alert. Non focal.  Extremities: Symmetric power Skin: No rashes, lesions or ulcers   Data Reviewed: I have personally reviewed following labs and imaging studies  CBC: Recent Labs  Lab 07/17/19 0448 07/17/19 0448 07/17/19 1311 07/17/19 1835 07/18/19 0047 07/18/19 0708 07/19/19 0435  WBC 5.4  --   --   --   --   --  4.9  NEUTROABS 2.9  --   --   --   --   --   --   HGB 9.2*   < > 8.5* 8.4* 8.3* 8.0* 8.2*  HCT 31.2*   < > 30.1* 29.0* 28.7* 27.7* 28.5*  MCV 76.8*  --   --   --   --   --  79.2*  PLT 409*  --   --   --   --   --  328   < > = values in this interval not displayed.   Basic Metabolic Panel: Recent Labs  Lab 07/17/19 0448 07/18/19 0947 07/18/19 1355 07/19/19 0435  NA 129* 134*  --  137  K 4.9 4.3  --  4.6  CL 94* 99  --  100  CO2 23 25  --  24  GLUCOSE 581* 306*  --  264*  BUN 23* 14  --  11  CREATININE 1.00 1.00  --  0.83  CALCIUM 9.4 8.8*  --  8.9  MG 1.9  --  2.0  --    GFR: Estimated Creatinine Clearance: 124.3 mL/min (by C-G formula based on SCr of 0.83 mg/dL). Liver Function Tests: Recent Labs  Lab 07/17/19 0448  AST 24  ALT 15  ALKPHOS 100  BILITOT 0.1*  PROT 8.0  ALBUMIN 3.6   Recent Labs  Lab 07/17/19 0448  LIPASE 91*   No results for input(s): AMMONIA in the last 168 hours. Coagulation Profile: Recent Labs  Lab 07/17/19 0448  INR 1.0   Cardiac Enzymes: No results for input(s): CKTOTAL, CKMB, CKMBINDEX, TROPONINI in the last 168 hours. BNP (last 3 results) No results for input(s): PROBNP in the last 8760 hours. HbA1C: Recent Labs    07/17/19 1149  HGBA1C 7.2*   CBG: Recent Labs  Lab  07/18/19 1130 07/18/19 1640 07/18/19 2145 07/19/19 0811 07/19/19 1159  GLUCAP 304* 232* 225* 202* 265*   Lipid Profile: No results for input(s): CHOL, HDL, LDLCALC, TRIG, CHOLHDL, LDLDIRECT in the last 72 hours. Thyroid Function Tests: No results for input(s): TSH, T4TOTAL, FREET4, T3FREE, THYROIDAB in the last 72 hours. Anemia Panel: No results for input(s): VITAMINB12, FOLATE, FERRITIN, TIBC, IRON, RETICCTPCT in the last 72 hours. Sepsis Labs: No results for input(s): PROCALCITON, LATICACIDVEN in the last 168 hours.  Recent Results (from the past 240 hour(s))  Respiratory Panel by RT PCR (Flu A&B, Covid) - Nasal Swab (BD Veritor Kit)     Status: None   Collection Time: 07/17/19  7:25 AM   Specimen: Nasal Swab (BD Veritor Kit)  Result Value Ref Range Status   SARS Coronavirus 2  by RT PCR NEGATIVE NEGATIVE Final    Comment: (NOTE) SARS-CoV-2 target nucleic acids are NOT DETECTED. The SARS-CoV-2 RNA is generally detectable in upper respiratoy specimens during the acute phase of infection. The lowest concentration of SARS-CoV-2 viral copies this assay can detect is 131 copies/mL. A negative result does not preclude SARS-Cov-2 infection and should not be used as the sole basis for treatment or other patient management decisions. A negative result may occur with  improper specimen collection/handling, submission of specimen other than nasopharyngeal swab, presence of viral mutation(s) within the areas targeted by this assay, and inadequate number of viral copies (<131 copies/mL). A negative result must be combined with clinical observations, patient history, and epidemiological information. The expected result is Negative. Fact Sheet for Patients:  PinkCheek.be Fact Sheet for Healthcare Providers:  GravelBags.it This test is not yet ap proved or cleared by the [place] FDA and  has been authorized for detection and/or  diagnosis of SARS-CoV-2 by FDA under an Emergency Use Authorization (EUA). This EUA will remain  in effect (meaning this test can be used) for the duration of the COVID-19 declaration under Section 564(b)(1) of the Act, 21 U.S.C. section 360bbb-3(b)(1), unless the authorization is terminated or revoked sooner.    Influenza A by PCR NEGATIVE NEGATIVE Final   Influenza B by PCR NEGATIVE NEGATIVE Final    Comment: (NOTE) The Xpert Xpress SARS-CoV-2/FLU/RSV assay is intended as an aid in  the diagnosis of influenza from Nasopharyngeal swab specimens and  should not be used as a sole basis for treatment. Nasal washings and  aspirates are unacceptable for Xpert Xpress SARS-CoV-2/FLU/RSV  testing. Fact Sheet for Patients: PinkCheek.be Fact Sheet for Healthcare Providers: GravelBags.it This test is not yet approved or cleared by the [place] FDA and  has been authorized for detection and/or diagnosis of SARS-CoV-2 by  FDA under an Emergency Use Authorization (EUA). This EUA will remain  in effect (meaning this test can be used) for the duration of the  Covid-19 declaration under Section 564(b)(1) of the Act, 21  U.S.C. section 360bbb-3(b)(1), unless the authorization is  terminated or revoked. Performed at [facility], [address] [place], [place] [address]   SARS Coronavirus 2 Ag (30 min TAT) - Nasal Swab (BD Veritor Kit)     Status: None   Collection Time: 07/17/19  7:25 AM   Specimen: Nasal Swab (BD Veritor Kit)  Result Value Ref Range Status   SARS Coronavirus 2 Ag NEGATIVE NEGATIVE Final    Comment: (NOTE) SARS-CoV-2 antigen NOT DETECTED.  Negative results are presumptive.  Negative results do not preclude SARS-CoV-2 infection and should not be used as the sole basis for treatment or other patient management decisions, including infection  control decisions, particularly in the presence of clinical signs  and  symptoms consistent with COVID-19, or in those who have been in contact with the virus.  Negative results must be combined with clinical observations, patient history, and epidemiological information. The expected result is Negative. Fact Sheet for Patients: PodPark.tn Fact Sheet for Healthcare Providers: GiftContent.is This test is not yet approved or cleared by the [place] FDA and  has been authorized for detection and/or diagnosis of SARS-CoV-2 by FDA under an Emergency Use Authorization (EUA).  This EUA will remain in effect (meaning this test can be used) for the duration of  the COVID-19 de claration under Section 564(b)(1) of the Act, 21 U.S.C. section 360bbb-3(b)(1), unless the authorization is terminated or revoked  sooner. Performed at [facility], [address] [place], [place] [address]          Radiology Studies: No results found.      Scheduled Meds: . amitriptyline  100 mg Oral QHS  . amitriptyline  50 mg Oral QHS  . fluconazole  200 mg Oral Daily  . insulin aspart  0-5 Units Subcutaneous QHS  . insulin aspart  0-9 Units Subcutaneous TID WC  . insulin glargine  15 Units Subcutaneous QHS  . lamoTRIgine  200 mg Oral Daily  . lurasidone  40 mg Oral QHS  . mometasone-formoterol  2 puff Inhalation BID  . multivitamin with minerals  1 tablet Oral Daily  . OLANZapine  10 mg Oral QHS  . ondansetron  4 mg Oral Q6H  . pantoprazole  40 mg Oral BID  . Ensure Max Protein  11 oz Oral Daily  . senna  1 tablet Oral BID  . sodium chloride flush  3 mL Intravenous Q12H  . traZODone  100 mg Oral QHS   Continuous Infusions: . sodium chloride       LOS: 2 days    Time spent: 35 minutes.     Elmarie Shiley, MD [facility]   If 7PM-7AM, please contact night-coverage [email or web address]  07/19/2019, 4:44 PM

## 2019-07-20 LAB — CBC
HCT: 28.7 % — ABNORMAL LOW (ref 36.0–46.0)
Hemoglobin: 8 g/dL — ABNORMAL LOW (ref 12.0–15.0)
MCH: 22 pg — ABNORMAL LOW (ref 26.0–34.0)
MCHC: 27.9 g/dL — ABNORMAL LOW (ref 30.0–36.0)
MCV: 79.1 fL — ABNORMAL LOW (ref 80.0–100.0)
Platelets: 326 10*3/uL (ref 150–400)
RBC: 3.63 MIL/uL — ABNORMAL LOW (ref 3.87–5.11)
RDW: 15.8 % — ABNORMAL HIGH (ref 11.5–15.5)
WBC: 4.6 10*3/uL (ref 4.0–10.5)
nRBC: 0 % (ref 0.0–0.2)

## 2019-07-20 LAB — GLUCOSE, CAPILLARY
Glucose-Capillary: 267 mg/dL — ABNORMAL HIGH (ref 70–99)
Glucose-Capillary: 294 mg/dL — ABNORMAL HIGH (ref 70–99)
Glucose-Capillary: 187 mg/dL — ABNORMAL HIGH (ref 70–99)
Glucose-Capillary: 184 mg/dL — ABNORMAL HIGH (ref 70–99)

## 2019-07-20 MED ORDER — ONDANSETRON HCL 4 MG PO TABS
4.0000 mg | ORAL_TABLET | Freq: Four times a day (QID) | ORAL | Status: DC | PRN
  Administered 2019-07-21: 4 mg via ORAL
  Filled 2019-07-20: qty 1

## 2019-07-20 MED ORDER — OXYCODONE HCL 5 MG PO TABS
10.0000 mg | ORAL_TABLET | Freq: Once | ORAL | Status: AC
  Administered 2019-07-20: 10 mg via ORAL
  Filled 2019-07-20: qty 2

## 2019-07-20 MED ORDER — INSULIN ASPART 100 UNIT/ML ~~LOC~~ SOLN
3.0000 [IU] | Freq: Three times a day (TID) | SUBCUTANEOUS | Status: DC
  Administered 2019-07-20 – 2019-07-21 (×5): 3 [IU] via SUBCUTANEOUS

## 2019-07-20 MED ORDER — INSULIN GLARGINE 100 UNIT/ML ~~LOC~~ SOLN
20.0000 [IU] | Freq: Every day | SUBCUTANEOUS | Status: DC
  Administered 2019-07-20: 21:00:00 20 [IU] via SUBCUTANEOUS
  Filled 2019-07-20 (×2): qty 0.2

## 2019-07-20 NOTE — Progress Notes (Signed)
I DC NJ tube with no complications.

## 2019-07-20 NOTE — Progress Notes (Signed)
PROGRESS NOTE    Monique Holland  ZOX:[id number] DOB: Jan 26, 1966 DOA: 07/17/2019 PCP: Kristie Cowman, MD   Brief Narrative: 54 year old with past medical history significant for diabetes type 2, hypertension, morbid obese, OSA, hyperparathyroidism, nephrolithiasis, schizophrenia, who presents to the ED complaining of vomiting dark-colored red emesis with bloody streaks.  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  She underwent NJ feeding placement for nutrition.  She was discharged with Dr. Clerance Lav tube feeding and sips of water by mouth for comfort.  During that hospitalization she was also found to have hypercalcemia secondary to primary hyperparathyroidism.  CT chest showed parathyroid adenoma.  Patient underwent left left parathyroidectomy. Evaluation in the ED CT abdomen pelvis show extensive large intrapancreatic and peripancreatic fluid collection associated with erythematosus necrosis of the pancreas.  Slight compression of the splenic vein without demonstrable splenic vein thrombosis  Assessment & Plan:   Active Problems:   Acute pancreatitis   Nausea and vomiting in adult   Necrotizing pancreatitis   Gastritis and gastroduodenitis   Candida esophagitis (HCC)   Pancreatic pseudocyst  1-Coffee-ground emesis: Patient underwent endoscopy on 12/25 which showed diffuse plaque entire esophagus.  No bleeding noted.  Moderate inflammation and erythema in the gastric atrium.  Not ulcers or erosion. Started on Diflucan for Candida esophagitis. continue Hemoglobin remained stable. Continue with PPI. Advance diet as tolerated.   2-recent history of acute necrotizing pancreatitis: Patient has [place] for nocturnal tube feedings.  Per GI okay to resume nocturnal tube feedings. Plan to remove NJ. Monitor for 24 hours to make sure patient doesn't  start vomiting,  Per GI patient might need in the future endoscopic cystogastrostomy for pancreatic fluid collection if this does not resolve.  3-type 2 diabetes: Continue with Lantus and sliding scale insulin adjust as needed.  Hypertension: Continue with losartan.  Psychiatric disorder: Schizophrenia, bipolar: Continue with amitriptyline Lamictal olanzapine lurasidone.  Stopped Zoloft she reported she had not been taking this medication. Monitor QT closely now that she is on Diflucan as well. Dc schedule Zofran.   Hypercalcemia, hyperparathyroidism, history of nephrolithiasis: During her recent hospitalization she was found to have hypercalcemia secondary to primary hyperparathyroidism.  She underwent went parathyroidectomy. Continue to monitor calcium level.  OSA resume CPAP  Morbid obesity: Need diet and exercise education Prolonged QT: Mg normal. QT improved.   Estimated body mass index is 43.65 kg/m as calculated from the following:   Height as of this encounter: 5' 11"  (1.803 m).   Weight as of this encounter: 142 kg.   DVT prophylaxis: SCDs, no anticoagulation due to GI bleed Code Status: Full code Family Communication: Care discussed with patient Disposition Plan:  Patient is from: Home Anticipated d/c date: 1 or 2 days if she is able to tolerate diet Barriers to d/c or necessity for inpatient status: Plan  to monitor oral intake and make sure she doesn't have recurrence of vomiting.   Consultants:   GI  Procedures:   Endoscopy  Antimicrobials:    Subjective: Patient report feeling better, denies significant abdominal pain.  Denies nausea. No vomiting   Objective: Vitals:   07/20/19 0544 07/20/19 0700 07/20/19 0743 07/20/19 1402  BP: (!) 155/100   (!) 151/104  Pulse: (!) 108   (!) 110  Resp: 16   18  Temp: 97.9 F (36.6 C)   98.7 F (37.1 C)  TempSrc: Oral  SpO2: 91% 94% 94% 96%  Weight:      Height:        Intake/Output Summary (Last 24  hours) at 07/20/2019 1539 Last data filed at 07/20/2019 1402 Gross per 24 hour  Intake 600 ml  Output -  Net 600 ml   Filed Weights   07/17/19 0438 07/18/19 0749  Weight: (!) 142 kg (!) 142 kg    Examination:  General exam: NAD Respiratory system: CTA Cardiovascular system: S 1, S 2 RRR Gastrointestinal system: BS present, soft, nt Central nervous system: Non focal.  Extremities: Symmetric power.  Skin: no rashes   Data Reviewed: I have personally reviewed following labs and imaging studies  CBC: Recent Labs  Lab 07/17/19 0448 07/17/19 1311 07/17/19 1835 07/18/19 0047 07/18/19 0708 07/19/19 0435 07/20/19 0519  WBC 5.4  --   --   --   --  4.9 4.6  NEUTROABS 2.9  --   --   --   --   --   --   HGB 9.2*   < > 8.4* 8.3* 8.0* 8.2* 8.0*  HCT 31.2*   < > 29.0* 28.7* 27.7* 28.5* 28.7*  MCV 76.8*  --   --   --   --  79.2* 79.1*  PLT 409*  --   --   --   --  328 326   < > = values in this interval not displayed.   Basic Metabolic Panel: Recent Labs  Lab 07/17/19 0448 07/18/19 0947 07/18/19 1355 07/19/19 0435  NA 129* 134*  --  137  K 4.9 4.3  --  4.6  CL 94* 99  --  100  CO2 23 25  --  24  GLUCOSE 581* 306*  --  264*  BUN 23* 14  --  11  CREATININE 1.00 1.00  --  0.83  CALCIUM 9.4 8.8*  --  8.9  MG 1.9  --  2.0  --    GFR: Estimated Creatinine Clearance: 124.3 mL/min (by C-G formula based on SCr of 0.83 mg/dL). Liver Function Tests: Recent Labs  Lab 07/17/19 0448  AST 24  ALT 15  ALKPHOS 100  BILITOT 0.1*  PROT 8.0  ALBUMIN 3.6   Recent Labs  Lab 07/17/19 0448  LIPASE 91*   No results for input(s): AMMONIA in the last 168 hours. Coagulation Profile: Recent Labs  Lab 07/17/19 0448  INR 1.0   Cardiac Enzymes: No results for input(s): CKTOTAL, CKMB, CKMBINDEX, TROPONINI in the last 168 hours. BNP (last 3 results) No results for input(s): PROBNP in the last 8760 hours. HbA1C: No results for input(s): HGBA1C in the last 72 hours. CBG: Recent  Labs  Lab 07/19/19 1159 07/19/19 1703 07/19/19 2145 07/20/19 0727 07/20/19 1154  GLUCAP 265* 317* 231* 267* 294*   Lipid Profile: No results for input(s): CHOL, HDL, LDLCALC, TRIG, CHOLHDL, LDLDIRECT in the last 72 hours. Thyroid Function Tests: No results for input(s): TSH, T4TOTAL, FREET4, T3FREE, THYROIDAB in the last 72 hours. Anemia Panel: No results for input(s): VITAMINB12, FOLATE, FERRITIN, TIBC, IRON, RETICCTPCT in the last 72 hours. Sepsis Labs: No results for input(s): PROCALCITON, LATICACIDVEN in the last 168 hours.  Recent Results (from the past 240 hour(s))  Respiratory Panel by RT PCR (Flu A&B, Covid) - Nasal Swab (BD Veritor Kit)     Status: None   Collection Time: 07/17/19  7:25 AM   Specimen: Nasal Swab (BD Veritor Kit)  Result Value Ref Range Status   SARS Coronavirus  2 by RT PCR NEGATIVE NEGATIVE Final    Comment: (NOTE) SARS-CoV-2 target nucleic acids are NOT DETECTED. The SARS-CoV-2 RNA is generally detectable in upper respiratoy specimens during the acute phase of infection. The lowest concentration of SARS-CoV-2 viral copies this assay can detect is 131 copies/mL. A negative result does not preclude SARS-Cov-2 infection and should not be used as the sole basis for treatment or other patient management decisions. A negative result may occur with  improper specimen collection/handling, submission of specimen other than nasopharyngeal swab, presence of viral mutation(s) within the areas targeted by this assay, and inadequate number of viral copies (<131 copies/mL). A negative result must be combined with clinical observations, patient history, and epidemiological information. The expected result is Negative. Fact Sheet for Patients:  PinkCheek.be Fact Sheet for Healthcare Providers:  GravelBags.it This test is not yet ap proved or cleared by the [place] FDA and  has been authorized for  detection and/or diagnosis of SARS-CoV-2 by FDA under an Emergency Use Authorization (EUA). This EUA will remain  in effect (meaning this test can be used) for the duration of the COVID-19 declaration under Section 564(b)(1) of the Act, 21 U.S.C. section 360bbb-3(b)(1), unless the authorization is terminated or revoked sooner.    Influenza A by PCR NEGATIVE NEGATIVE Final   Influenza B by PCR NEGATIVE NEGATIVE Final    Comment: (NOTE) The Xpert Xpress SARS-CoV-2/FLU/RSV assay is intended as an aid in  the diagnosis of influenza from Nasopharyngeal swab specimens and  should not be used as a sole basis for treatment. Nasal washings and  aspirates are unacceptable for Xpert Xpress SARS-CoV-2/FLU/RSV  testing. Fact Sheet for Patients: PinkCheek.be Fact Sheet for Healthcare Providers: GravelBags.it This test is not yet approved or cleared by the [place] FDA and  has been authorized for detection and/or diagnosis of SARS-CoV-2 by  FDA under an Emergency Use Authorization (EUA). This EUA will remain  in effect (meaning this test can be used) for the duration of the  Covid-19 declaration under Section 564(b)(1) of the Act, 21  U.S.C. section 360bbb-3(b)(1), unless the authorization is  terminated or revoked. Performed at [facility], [address] [place], [place] [address]   SARS Coronavirus 2 Ag (30 min TAT) - Nasal Swab (BD Veritor Kit)     Status: None   Collection Time: 07/17/19  7:25 AM   Specimen: Nasal Swab (BD Veritor Kit)  Result Value Ref Range Status   SARS Coronavirus 2 Ag NEGATIVE NEGATIVE Final    Comment: (NOTE) SARS-CoV-2 antigen NOT DETECTED.  Negative results are presumptive.  Negative results do not preclude SARS-CoV-2 infection and should not be used as the sole basis for treatment or other patient management decisions, including infection  control decisions, particularly in the presence  of clinical signs and  symptoms consistent with COVID-19, or in those who have been in contact with the virus.  Negative results must be combined with clinical observations, patient history, and epidemiological information. The expected result is Negative. Fact Sheet for Patients: PodPark.tn Fact Sheet for Healthcare Providers: GiftContent.is This test is not yet approved or cleared by the [place] FDA and  has been authorized for detection and/or diagnosis of SARS-CoV-2 by FDA under an Emergency Use Authorization (EUA).  This EUA will remain in effect (meaning this test can be used) for the duration of  the COVID-19 de claration under Section 564(b)(1) of the Act, 21 U.S.C. section 360bbb-3(b)(1), unless the authorization is terminated or  revoked sooner. Performed at [facility], [address] [place], [place] [address]          Radiology Studies: No results found.      Scheduled Meds: . amitriptyline  100 mg Oral QHS  . amitriptyline  50 mg Oral QHS  . fluconazole  200 mg Oral Daily  . insulin aspart  0-5 Units Subcutaneous QHS  . insulin aspart  0-9 Units Subcutaneous TID WC  . insulin aspart  3 Units Subcutaneous TID WC  . insulin glargine  20 Units Subcutaneous QHS  . lamoTRIgine  200 mg Oral Daily  . lurasidone  40 mg Oral QHS  . mometasone-formoterol  2 puff Inhalation BID  . multivitamin with minerals  1 tablet Oral Daily  . OLANZapine  10 mg Oral QHS  . pantoprazole  40 mg Oral BID  . Ensure Max Protein  11 oz Oral Daily  . senna  1 tablet Oral BID  . sodium chloride flush  3 mL Intravenous Q12H  . traZODone  100 mg Oral QHS   Continuous Infusions: . sodium chloride       LOS: 3 days    Time spent: 35 minutes.     Elmarie Shiley, MD [facility]   If 7PM-7AM, please contact night-coverage [email or web address]  07/20/2019, 3:39 PM

## 2019-07-21 ENCOUNTER — Encounter: Payer: Self-pay | Admitting: *Deleted

## 2019-07-21 LAB — GLUCOSE, CAPILLARY
Glucose-Capillary: 211 mg/dL — ABNORMAL HIGH (ref 70–99)
Glucose-Capillary: 216 mg/dL — ABNORMAL HIGH (ref 70–99)

## 2019-07-21 MED ORDER — LURASIDONE HCL 40 MG PO TABS: 40.0000 mg | ORAL_TABLET | Freq: Every day | ORAL | 2 refills | Status: DC

## 2019-07-21 MED ORDER — FLUCONAZOLE 200 MG PO TABS: 200.0000 mg | ORAL_TABLET | Freq: Every day | ORAL | 0 refills | Status: AC

## 2019-07-21 MED ORDER — PANTOPRAZOLE SODIUM 40 MG PO TBEC: 40.0000 mg | DELAYED_RELEASE_TABLET | Freq: Two times a day (BID) | ORAL | 0 refills | Status: DC

## 2019-07-21 MED ORDER — ALBUTEROL SULFATE HFA 108 (90 BASE) MCG/ACT IN AERS: 1.0000 | INHALATION_SPRAY | Freq: Four times a day (QID) | RESPIRATORY_TRACT | 0 refills | Status: DC | PRN

## 2019-07-21 MED ORDER — OXYCODONE HCL 5 MG PO TABS: 5.0000 mg | ORAL_TABLET | Freq: Four times a day (QID) | ORAL | 0 refills | Status: AC | PRN

## 2019-07-21 MED ORDER — LANTUS SOLOSTAR 100 UNIT/ML ~~LOC~~ SOPN: 15.0000 [IU] | PEN_INJECTOR | Freq: Every day | SUBCUTANEOUS | 11 refills | Status: DC

## 2019-07-21 MED ORDER — SENNA 8.6 MG PO TABS: 1.0000 | ORAL_TABLET | Freq: Two times a day (BID) | ORAL | 0 refills | Status: DC

## 2019-07-21 MED ORDER — ONDANSETRON 4 MG PO TBDP: 4.0000 mg | ORAL_TABLET | Freq: Three times a day (TID) | ORAL | 0 refills | Status: DC | PRN

## 2019-07-21 MED ORDER — ACETAMINOPHEN 325 MG PO TABS: 650.0000 mg | ORAL_TABLET | Freq: Four times a day (QID) | ORAL | 0 refills | Status: DC | PRN

## 2019-07-21 MED ORDER — OXYCODONE HCL 5 MG PO TABS
5.0000 mg | ORAL_TABLET | Freq: Four times a day (QID) | ORAL | Status: DC | PRN
  Administered 2019-07-21: 10 mg via ORAL
  Filled 2019-07-21: qty 2

## 2019-07-21 NOTE — Discharge Summary (Addendum)
Physician Discharge Summary  Monique Holland FOY:[id number] DOB: [birth year] DOA: 07/17/2019  PCP: Kristie Cowman, MD  Admit date: 07/17/2019 Discharge date: 07/21/2019  Admitted From: Home  Disposition: Home   Recommendations for Outpatient Follow-up:  1. Follow up with PCP in 1-2 weeks 2. Please obtain BMP/CBC in one week 3. Follow up with GI for further evaluation of pancreatitis, fluid collection in the pancreas   Home Health: none  Discharge Condition: Stable.  CODE STATUS: Full code Diet recommendation: Heart Healthy   Brief/Interim Summary: 54 year old with past medical history significant for diabetes type 2, hypertension, morbid obese, OSA, hyperparathyroidism, nephrolithiasis, schizophrenia, who presents to the ED complaining of vomiting dark-colored red emesis with bloody streaks.  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  She underwent NJ feeding placement for nutrition.  She was discharged with Dr. Clerance Lav tube feeding and sips of water by mouth for comfort.  During that hospitalization she was also found to have hypercalcemia secondary to primary hyperparathyroidism.  CT chest showed parathyroid adenoma.  Patient underwent left left parathyroidectomy. Evaluation in the ED CT abdomen pelvis show extensive large intrapancreatic and peripancreatic fluid collection associated with erythematosus necrosis of the pancreas.  Slight compression of the splenic vein without demonstrable splenic vein thrombosis.   1-Coffee-ground emesis: Patient underwent endoscopy on 12/25 which showed diffuse plaque entire esophagus.  No bleeding noted.  Moderate inflammation and erythema in the gastric atrium.  Not ulcers or erosion. Started on Diflucan for Candida esophagitis. continue Hemoglobin remained stable. Continue with PPI. Advance diet as  tolerated.  hb stable.   2-recent history of acute necrotizing pancreatitis: Patient has [place] for nocturnal tube feedings.  Per GI okay to resume nocturnal tube feedings. Plan to remove NJ. Monitor for 24 hours to make sure patient doesn't start vomiting,  Per GI patient might need in the future endoscopic cystogastrostomy for pancreatic fluid collection if this does not resolve. Still with abdominal pain,. Will provide 5 days of oxycodone. Explain to patient pain should imprpoved over time. She should try tylenol first.  Close follow up with GI.   3-type 2 diabetes: Continue with Lantus and sliding scale insulin adjust as needed.  Candidal Esophagitis;  Started on diflucan. Will provide 20 days treatment at discharge   Hypertension: Continue with losartan.  Psychiatric disorder: Schizophrenia, bipolar: Continue with amitriptyline Lamictal olanzapine lurasidone.  Stopped Zoloft she reported she had not been taking this medication. Monitor QT closely now that she is on Diflucan as well. Dc schedule Zofran.   Hypercalcemia, hyperparathyroidism, history of nephrolithiasis: During her recent hospitalization she was found to have hypercalcemia secondary to primary hyperparathyroidism.  She underwent went parathyroidectomy. Continue to monitor calcium level.  OSA resume CPAP Acute esophagitis and chronic gastritis; on PPI  Morbid obesity: Need diet and exercise education Prolonged QT: Mg normal. QT improved.    Discharge Diagnoses:  Active Problems:   Acute pancreatitis   Nausea and vomiting in adult   Necrotizing pancreatitis   Gastritis and gastroduodenitis   Candida esophagitis (HCC)   Pancreatic pseudocyst    Discharge Instructions  Discharge Instructions    Diet - low sodium heart healthy   Complete by: As directed    Increase activity slowly   Complete by: As directed      Allergies as of 07/21/2019   No Known Allergies     Medication List  STOP taking  these medications   atorvastatin 40 MG tablet Commonly known as: LIPITOR   diclofenac 75 MG EC tablet Commonly known as: VOLTAREN   glimepiride 2 MG tablet Commonly known as: AMARYL   Janumet 50-1000 MG tablet Generic drug: sitaGLIPtin-metformin   lisinopril-hydrochlorothiazide 20-12.5 MG tablet Commonly known as: ZESTORETIC   losartan 25 MG tablet Commonly known as: COZAAR   omeprazole 40 MG capsule Commonly known as: PRILOSEC     TAKE these medications   acetaminophen 325 MG tablet Commonly known as: TYLENOL Take 2 tablets (650 mg total) by mouth every 6 (six) hours as needed for mild pain (or Fever >/= 101).   amitriptyline 100 MG tablet Commonly known as: ELAVIL Take 100 mg by mouth at bedtime.   clonazePAM 0.5 MG tablet Commonly known as: KLONOPIN Take 0.5 mg by mouth daily as needed for anxiety.   fluconazole 200 MG tablet Commonly known as: DIFLUCAN Take 1 tablet (200 mg total) by mouth daily for 20 days.   lamoTRIgine 200 MG tablet Commonly known as: LAMICTAL Take 200 mg by mouth daily.   Lantus SoloStar 100 UNIT/ML Solostar Pen Generic drug: Insulin Glargine Inject 15 Units into the skin at bedtime. What changed: how much to take   lurasidone 40 MG Tabs tablet Commonly known as: LATUDA Take 1 tablet (40 mg total) by mouth at bedtime. What changed: how much to take   OLANZapine 5 MG tablet Commonly known as: ZYPREXA Take 5 mg by mouth at bedtime.   ondansetron 4 MG disintegrating tablet Commonly known as: Zofran ODT Take 1 tablet (4 mg total) by mouth every 8 (eight) hours as needed for nausea or vomiting.   oxyCODONE 5 MG immediate release tablet Commonly known as: Oxy IR/ROXICODONE Take 1-2 tablets (5-10 mg total) by mouth every 6 (six) hours as needed for up to 5 days for severe pain. What changed:   medication strength  how much to take  when to take this  reasons to take this   pantoprazole 40 MG tablet Commonly known as:  PROTONIX Take 1 tablet (40 mg total) by mouth 2 (two) times daily.   senna 8.6 MG Tabs tablet Commonly known as: SENOKOT Take 1 tablet (8.6 mg total) by mouth 2 (two) times daily.   Symbicort 160-4.5 MCG/ACT inhaler Generic drug: budesonide-formoterol Inhale 2 puffs into the lungs 2 (two) times daily as needed (SOB/wheezing).   traZODone 100 MG tablet Commonly known as: DESYREL Take 100 mg by mouth at bedtime.   Trulicity 1.5 WT/8.8EK Sopn Generic drug: Dulaglutide Inject 1.5 mg into the skin once a week.   Ventolin HFA 108 (90 Base) MCG/ACT inhaler Generic drug: albuterol Inhale 1-2 puffs into the lungs every 6 (six) hours as needed for wheezing or shortness of breath.       No Known Allergies  Consultations:  GI   Procedures/Studies: CT ABDOMEN PELVIS W CONTRAST  Addendum Date: 07/17/2019   ADDENDUM REPORT: 07/17/2019 06:59 ADDENDUM: Following further discussion with the ordering provider, the larger pancreatic collection with debris noted in the superior aspect is notably positioned along the greater curvature of the stomach. There is loss of discernible fat plane. It is difficult to ascertain whether there is developing fistulization or direct involvement of the greater curvature of the stomach though this is certainly a possibility given reported history of 'black' emesis which could reflect either bile, blood or necrosis. Consider urgent GI consultation and consideration of endoscopy. These results were called by telephone at the  time of interpretation on 07/17/2019 at 6:59 am to provider [facility] , who verbally acknowledged these results. Electronically Signed   By: Lovena Le M.D.   On: 07/17/2019 06:59   Result Date: 07/17/2019 CLINICAL DATA:  Generalized abdominal pain, recent hospitalization to [facility] for necrotizing pancreatitis. EXAM: CT ABDOMEN AND PELVIS WITH CONTRAST TECHNIQUE: Multidetector CT imaging of the abdomen and pelvis was performed using the standard  protocol following bolus administration of intravenous contrast. CONTRAST:  169m OMNIPAQUE IOHEXOL 300 MG/ML  SOLN COMPARISON:  CT chest 08/04/2018 and CT abdomen pelvis performed 06/21/2019 at outside facility (report only) FINDINGS: Lower chest: Atelectatic changes are present in the lung bases. Normal heart size. No pericardial effusion. Hepatobiliary: No focal liver abnormality is seen. No gallstones, gallbladder wall thickening or pericholecystic inflammation. Prominent fold at the gallbladder neck is similar to comparison. No intra or extrahepatic biliary ductal dilatation. Pancreas: There are extensive hypoattenuating parenchymal replacing collections involving the pancreatic head body and tail, largest is a multiloculated collection extending from the head to pancreatic body measuring up to 7.9 x 12.2 x 10.4 cm in size with some intermediate attenuation possibly reflecting hemorrhagic debris or proteinaceous material (5/38, 2/48). Additional lobular collection measuring approximately 5.2 x 6.7 x 6.3 cm involving the pancreatic tail. Extensive peripancreatic inflammation is seen. Per outside report dated 06/21/2019 the pancreatic parenchyma previously demonstrated areas of hypoattenuation compatible with necrotizing pancreatitis. Given time course from prior imaging these collections are most compatible with acute necrotic collections. Nonvisualization of the pancreatic duct slight narrowing of the splenic vein without complete occlusion or thrombus formation. Spleen: Normal in size without focal abnormality. Adrenals/Urinary Tract: Normal adrenal glands. Bilateral diffuse medullary calcification similar to comparison studies. Stable mild bilateral symmetric perinephric stranding, a nonspecific finding though may correlate with either age or decreased renal function. Kidneys are otherwise unremarkable, without renal calculi, suspicious lesion, or hydronephrosis. Bladder is unremarkable. Stomach/Bowel:  Transesophageal tube terminates within the proximal jejunum beyond the ligament of Treitz. Distal esophagus, stomach and duodenal sweep are unremarkable. Several loops of small bowel demonstrate some edematous thickening with adjacent inflammatory changes likely arising from the pancreatic process detailed above. No evidence of bowel obstruction. Postsurgical changes at the cecal tip compatible with prior appendectomy. There is segmental thickening of the splenic flexure as it comes in close proximity to the tail of the pancreas. No resulting obstruction. Scattered colonic diverticula without focal pericolonic inflammation to suggest diverticulitis. Vascular/Lymphatic: Atherosclerotic plaque within the normal caliber aorta. Reactive adenopathy in the upper abdomen with edematous retroperitoneal and intraperitoneal changes, as detailed above. Reproductive: Anteverted uterus. No concerning adnexal lesions. Other: Intraperitoneal and retroperitoneal inflammatory changes centered upon the pancreatic process as detailed above. Small amount of inflammatory retroperitoneal fluid is noted. No free air. No bowel containing hernias. Soft tissue nodularity in the low anterior abdomen subcutaneous compatible with injectable use such as insulin or anticoagulation. Musculoskeletal: Multilevel degenerative changes are present throughout the thoracic and lumbar spine. 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 IMPRESSION: 1. Extensive large intrapancreatic and peripancreatic fluid collections associated with the edematous and necrotic pancreas in the setting of known necrotizing pancreatitis. Given time course from the initial imaging (less than 4 weeks) these collections are most compatible with acute necrotic collection per revised [place] criteria. 2. Slight compression  of the splenic vein without demonstrable splenic vein thrombus. 3. Extensive intraperitoneal and retroperitoneal inflammatory changes  secondary to the pancreatic inflammation above. Associated reactive thickening of both the small bowel and splenic flexure. No resulting evidence of bowel obstruction. 4. Bilateral medullary nephrocalcinosis, similar to comparison studies. 5. Colonic diverticulosis without evidence of diverticulitis. 6. 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 7. Soft tissue nodularity in the low anterior abdomen subcutaneous tissues compatible with injectable use such as insulin or anticoagulation. 8. Aortic Atherosclerosis (ICD10-I70.0). Electronically Signed: By: Lovena Le M.D. On: 07/17/2019 06:40      Subjective: She is still having abdominal pain 6/10, better than on admission , was 10/10./  No vomiting. Gets nauseous at time.   Discharge Exam: Vitals:   07/21/19 0603 07/21/19 0740  BP: 126/73   Pulse: (!) 102   Resp: 18   Temp: 98.2 F (36.8 C)   SpO2: 94% 96%     General: Pt is alert, awake, not in acute distress Cardiovascular: RRR, S1/S2 +, no rubs, no gallops Respiratory: CTA bilaterally, no wheezing, no rhonchi Abdominal: Soft, NT, ND, bowel sounds + Extremities: no edema, no cyanosis    The results of significant diagnostics from this hospitalization (including imaging, microbiology, ancillary and laboratory) are listed below for reference.     Microbiology: Recent Results (from the past 240 hour(s))  Respiratory Panel by RT PCR (Flu A&B, Covid) - Nasal Swab (BD Veritor Kit)     Status: None   Collection Time: 07/17/19  7:25 AM   Specimen: Nasal Swab (BD Veritor Kit)  Result Value Ref Range Status   SARS Coronavirus 2 by RT PCR NEGATIVE NEGATIVE Final    Comment: (NOTE) SARS-CoV-2 target nucleic acids are NOT DETECTED. The SARS-CoV-2 RNA is generally detectable in upper respiratoy specimens during the acute phase of infection.  The lowest concentration of SARS-CoV-2 viral copies this assay can detect is 131 copies/mL. A negative result does not preclude SARS-Cov-2 infection and should not be used as the sole basis for treatment or other patient management decisions. A negative result may occur with  improper specimen collection/handling, submission of specimen other than nasopharyngeal swab, presence of viral mutation(s) within the areas targeted by this assay, and inadequate number of viral copies (<131 copies/mL). A negative result must be combined with clinical observations, patient history, and epidemiological information. The expected result is Negative. Fact Sheet for Patients:  PinkCheek.be Fact Sheet for Healthcare Providers:  GravelBags.it This test is not yet ap proved or cleared by the [place] FDA and  has been authorized for detection and/or diagnosis of SARS-CoV-2 by FDA under an Emergency Use Authorization (EUA). This EUA will remain  in effect (meaning this test can be used) for the duration of the COVID-19 declaration under Section 564(b)(1) of the Act, 21 U.S.C. section 360bbb-3(b)(1), unless the authorization is terminated or revoked sooner.    Influenza A by PCR NEGATIVE NEGATIVE Final   Influenza B by PCR NEGATIVE NEGATIVE Final    Comment: (NOTE) The Xpert Xpress SARS-CoV-2/FLU/RSV assay is intended as an aid in  the diagnosis of influenza from Nasopharyngeal swab specimens and  should not be used as a sole basis for treatment. Nasal washings and  aspirates are unacceptable for Xpert Xpress SARS-CoV-2/FLU/RSV  testing. Fact Sheet for Patients: PinkCheek.be Fact Sheet for Healthcare Providers: GravelBags.it This test is not yet approved or cleared by the [place] FDA and  has been authorized for detection and/or diagnosis of SARS-CoV-2 by  FDA under an  Emergency Use Authorization (EUA). This EUA will remain  in effect (meaning  this test can be used) for the duration of the  Covid-19 declaration under Section 564(b)(1) of the Act, 21  U.S.C. section 360bbb-3(b)(1), unless the authorization is  terminated or revoked. Performed at [facility], [address] [place], [place] [address]   SARS Coronavirus 2 Ag (30 min TAT) - Nasal Swab (BD Veritor Kit)     Status: None   Collection Time: 07/17/19  7:25 AM   Specimen: Nasal Swab (BD Veritor Kit)  Result Value Ref Range Status   SARS Coronavirus 2 Ag NEGATIVE NEGATIVE Final    Comment: (NOTE) SARS-CoV-2 antigen NOT DETECTED.  Negative results are presumptive.  Negative results do not preclude SARS-CoV-2 infection and should not be used as the sole basis for treatment or other patient management decisions, including infection  control decisions, particularly in the presence of clinical signs and  symptoms consistent with COVID-19, or in those who have been in contact with the virus.  Negative results must be combined with clinical observations, patient history, and epidemiological information. The expected result is Negative. Fact Sheet for Patients: PodPark.tn Fact Sheet for Healthcare Providers: GiftContent.is This test is not yet approved or cleared by the [place] FDA and  has been authorized for detection and/or diagnosis of SARS-CoV-2 by FDA under an Emergency Use Authorization (EUA).  This EUA will remain in effect (meaning this test can be used) for the duration of  the COVID-19 de claration under Section 564(b)(1) of the Act, 21 U.S.C. section 360bbb-3(b)(1), unless the authorization is terminated or revoked sooner. Performed at [facility], [address] [place], [place] [address]      Labs: BNP (last 3 results) No results for input(s): BNP in the last 8760 hours. Basic Metabolic  Panel: Recent Labs  Lab 07/17/19 0448 07/18/19 0947 07/18/19 1355 07/19/19 0435  NA 129* 134*  --  137  K 4.9 4.3  --  4.6  CL 94* 99  --  100  CO2 23 25  --  24  GLUCOSE 581* 306*  --  264*  BUN 23* 14  --  11  CREATININE 1.00 1.00  --  0.83  CALCIUM 9.4 8.8*  --  8.9  MG 1.9  --  2.0  --    Liver Function Tests: Recent Labs  Lab 07/17/19 0448  AST 24  ALT 15  ALKPHOS 100  BILITOT 0.1*  PROT 8.0  ALBUMIN 3.6   Recent Labs  Lab 07/17/19 0448  LIPASE 91*   No results for input(s): AMMONIA in the last 168 hours. CBC: Recent Labs  Lab 07/17/19 0448 07/17/19 1311 07/17/19 1835 07/18/19 0047 07/18/19 0708 07/19/19 0435 07/20/19 0519  WBC 5.4  --   --   --   --  4.9 4.6  NEUTROABS 2.9  --   --   --   --   --   --   HGB 9.2*   < > 8.4* 8.3* 8.0* 8.2* 8.0*  HCT 31.2*   < > 29.0* 28.7* 27.7* 28.5* 28.7*  MCV 76.8*  --   --   --   --  79.2* 79.1*  PLT 409*  --   --   --   --  328 326   < > = values in this interval not displayed.   Cardiac Enzymes: No results for input(s): CKTOTAL, CKMB, CKMBINDEX, TROPONINI in the last 168 hours. BNP: Invalid input(s): POCBNP CBG: Recent Labs  Lab 07/20/19 0727 07/20/19 1154 07/20/19  1659 07/20/19 2100 07/21/19 0718  GLUCAP 267* 294* 187* 184* 211*   D-Dimer No results for input(s): DDIMER in the last 72 hours. Hgb A1c No results for input(s): HGBA1C in the last 72 hours. Lipid Profile No results for input(s): CHOL, HDL, LDLCALC, TRIG, CHOLHDL, LDLDIRECT in the last 72 hours. Thyroid function studies No results for input(s): TSH, T4TOTAL, T3FREE, THYROIDAB in the last 72 hours.  Invalid input(s): FREET3 Anemia work up No results for input(s): VITAMINB12, FOLATE, FERRITIN, TIBC, IRON, RETICCTPCT in the last 72 hours. Urinalysis    Component Value Date/Time   COLORURINE YELLOW 07/17/2019 0529   APPEARANCEUR HAZY (A) 07/17/2019 0529   LABSPEC 1.010 07/17/2019 0529   PHURINE 7.0 07/17/2019 0529   GLUCOSEU >=500  (A) 07/17/2019 0529   HGBUR NEGATIVE 07/17/2019 0529   BILIRUBINUR NEGATIVE 07/17/2019 0529   KETONESUR NEGATIVE 07/17/2019 0529   PROTEINUR NEGATIVE 07/17/2019 0529   UROBILINOGEN 0.2 02/12/2015 1348   NITRITE NEGATIVE 07/17/2019 0529   LEUKOCYTESUR NEGATIVE 07/17/2019 0529   Sepsis Labs Invalid input(s): PROCALCITONIN,  WBC,  LACTICIDVEN Microbiology Recent Results (from the past 240 hour(s))  Respiratory Panel by RT PCR (Flu A&B, Covid) - Nasal Swab (BD Veritor Kit)     Status: None   Collection Time: 07/17/19  7:25 AM   Specimen: Nasal Swab (BD Veritor Kit)  Result Value Ref Range Status   SARS Coronavirus 2 by RT PCR NEGATIVE NEGATIVE Final    Comment: (NOTE) SARS-CoV-2 target nucleic acids are NOT DETECTED. The SARS-CoV-2 RNA is generally detectable in upper respiratoy specimens during the acute phase of infection. The lowest concentration of SARS-CoV-2 viral copies this assay can detect is 131 copies/mL. A negative result does not preclude SARS-Cov-2 infection and should not be used as the sole basis for treatment or other patient management decisions. A negative result may occur with  improper specimen collection/handling, submission of specimen other than nasopharyngeal swab, presence of viral mutation(s) within the areas targeted by this assay, and inadequate number of viral copies (<131 copies/mL). A negative result must be combined with clinical observations, patient history, and epidemiological information. The expected result is Negative. Fact Sheet for Patients:  PinkCheek.be Fact Sheet for Healthcare Providers:  GravelBags.it This test is not yet ap proved or cleared by the [place] FDA and  has been authorized for detection and/or diagnosis of SARS-CoV-2 by FDA under an Emergency Use Authorization (EUA). This EUA will remain  in effect (meaning this test can be used) for the duration of the COVID-19  declaration under Section 564(b)(1) of the Act, 21 U.S.C. section 360bbb-3(b)(1), unless the authorization is terminated or revoked sooner.    Influenza A by PCR NEGATIVE NEGATIVE Final   Influenza B by PCR NEGATIVE NEGATIVE Final    Comment: (NOTE) The Xpert Xpress SARS-CoV-2/FLU/RSV assay is intended as an aid in  the diagnosis of influenza from Nasopharyngeal swab specimens and  should not be used as a sole basis for treatment. Nasal washings and  aspirates are unacceptable for Xpert Xpress SARS-CoV-2/FLU/RSV  testing. Fact Sheet for Patients: PinkCheek.be Fact Sheet for Healthcare Providers: GravelBags.it This test is not yet approved or cleared by the [place] FDA and  has been authorized for detection and/or diagnosis of SARS-CoV-2 by  FDA under an Emergency Use Authorization (EUA). This EUA will remain  in effect (meaning this test can be used) for the duration of the  Covid-19 declaration under Section 564(b)(1) of the Act, 21  U.S.C. section 360bbb-3(b)(1), unless the  authorization is  terminated or revoked. Performed at [facility], [address] [place], [place] [address]   SARS Coronavirus 2 Ag (30 min TAT) - Nasal Swab (BD Veritor Kit)     Status: None   Collection Time: 07/17/19  7:25 AM   Specimen: Nasal Swab (BD Veritor Kit)  Result Value Ref Range Status   SARS Coronavirus 2 Ag NEGATIVE NEGATIVE Final    Comment: (NOTE) SARS-CoV-2 antigen NOT DETECTED.  Negative results are presumptive.  Negative results do not preclude SARS-CoV-2 infection and should not be used as the sole basis for treatment or other patient management decisions, including infection  control decisions, particularly in the presence of clinical signs and  symptoms consistent with COVID-19, or in those who have been in contact with the virus.  Negative results must be combined with clinical observations, patient  history, and epidemiological information. The expected result is Negative. Fact Sheet for Patients: PodPark.tn Fact Sheet for Healthcare Providers: GiftContent.is This test is not yet approved or cleared by the [place] FDA and  has been authorized for detection and/or diagnosis of SARS-CoV-2 by FDA under an Emergency Use Authorization (EUA).  This EUA will remain in effect (meaning this test can be used) for the duration of  the COVID-19 de claration under Section 564(b)(1) of the Act, 21 U.S.C. section 360bbb-3(b)(1), unless the authorization is terminated or revoked sooner. Performed at [facility], [address], [place], [place] [address]      Time coordinating discharge: 40 minutes  SIGNED:   Elmarie Shiley, MD  [facility]

## 2019-07-21 NOTE — Progress Notes (Signed)
Discharge instructions explained to Patient. Script was given for Zofran and the rest of the prescriptions were called into the pharmacy. Educational information given about pancreatitis. Monique Holland stated she didn't have any questions about the discharge instructions or her medication instructions. She was discharged via wheelchair to her husband, who will take patient home.

## 2019-07-22 ENCOUNTER — Telehealth: Payer: Self-pay

## 2019-07-22 NOTE — Telephone Encounter (Signed)
Staff message received from Mike Gip for this patient-  Recent severe necrotizing pancreatitis with pseudocysts  Admitted with coffee-ground emesis and vomiting  Needs office follow-up in 3 to 4 weeks-Dr. Barron Alvine     Patient has already been scheduled for a f/u appt on 09/03/2019 at 9:40 am with Dr. Barron Alvine at the [facility] office;

## 2019-07-22 NOTE — Telephone Encounter (Signed)
Please verify this patient is aware of this appt

## 2019-07-23 NOTE — Telephone Encounter (Signed)
Called and spoke with patient -patient is agreeable to appt date/time on 09/03/2019 at 9:40 am; Patient advised to call back to the office at [phone] should questions/concerns arise;  Patient verbalized understanding of information/instructions;

## 2019-07-29 ENCOUNTER — Emergency Department (HOSPITAL_BASED_OUTPATIENT_CLINIC_OR_DEPARTMENT_OTHER): Payer: 59

## 2019-07-29 ENCOUNTER — Other Ambulatory Visit: Payer: Self-pay

## 2019-07-29 ENCOUNTER — Encounter (HOSPITAL_BASED_OUTPATIENT_CLINIC_OR_DEPARTMENT_OTHER): Payer: Self-pay | Admitting: *Deleted

## 2019-07-29 ENCOUNTER — Emergency Department (HOSPITAL_BASED_OUTPATIENT_CLINIC_OR_DEPARTMENT_OTHER)
Admission: EM | Admit: 2019-07-29 | Discharge: 2019-07-29 | Disposition: A | Discharge status: Home / Self Care | Payer: 59 | Attending: Emergency Medicine | Admitting: Emergency Medicine

## 2019-07-29 DIAGNOSIS — E119 Type 2 diabetes mellitus without complications: Secondary | ICD-10-CM | POA: Insufficient documentation

## 2019-07-29 DIAGNOSIS — K859 Acute pancreatitis without necrosis or infection, unspecified: Secondary | ICD-10-CM

## 2019-07-29 DIAGNOSIS — I10 Essential (primary) hypertension: Secondary | ICD-10-CM | POA: Diagnosis not present

## 2019-07-29 DIAGNOSIS — Z79899 Other long term (current) drug therapy: Secondary | ICD-10-CM | POA: Diagnosis not present

## 2019-07-29 DIAGNOSIS — R1030 Lower abdominal pain, unspecified: Secondary | ICD-10-CM | POA: Diagnosis present

## 2019-07-29 LAB — CBC
HCT: 36.3 % (ref 36.0–46.0)
Hemoglobin: 10.4 g/dL — ABNORMAL LOW (ref 12.0–15.0)
MCH: 22.3 pg — ABNORMAL LOW (ref 26.0–34.0)
MCHC: 28.7 g/dL — ABNORMAL LOW (ref 30.0–36.0)
MCV: 77.9 fL — ABNORMAL LOW (ref 80.0–100.0)
Platelets: 315 10*3/uL (ref 150–400)
RBC: 4.66 MIL/uL (ref 3.87–5.11)
RDW: 16.2 % — ABNORMAL HIGH (ref 11.5–15.5)
WBC: 6.6 10*3/uL (ref 4.0–10.5)
nRBC: 0 % (ref 0.0–0.2)

## 2019-07-29 LAB — COMPREHENSIVE METABOLIC PANEL
ALT: 16 U/L (ref 0–44)
AST: 23 U/L (ref 15–41)
Albumin: 3.8 g/dL (ref 3.5–5.0)
Alkaline Phosphatase: 89 U/L (ref 38–126)
Anion gap: 11 (ref 5–15)
BUN: 9 mg/dL (ref 6–20)
CO2: 23 mmol/L (ref 22–32)
Calcium: 9.1 mg/dL (ref 8.9–10.3)
Chloride: 97 mmol/L — ABNORMAL LOW (ref 98–111)
Creatinine, Ser: 0.98 mg/dL (ref 0.44–1.00)
GFR calc Af Amer: >60 mL/min (ref >60)
GFR calc non Af Amer: >60 mL/min (ref >60)
Glucose, Bld: 361 mg/dL — ABNORMAL HIGH (ref 70–99)
Potassium: 4 mmol/L (ref 3.5–5.1)
Sodium: 131 mmol/L — ABNORMAL LOW (ref 135–145)
Total Bilirubin: 0.5 mg/dL (ref 0.3–1.2)
Total Protein: 7.3 g/dL (ref 6.5–8.1)

## 2019-07-29 LAB — URINALYSIS, ROUTINE W REFLEX MICROSCOPIC
Bilirubin Urine: NEGATIVE
Glucose, UA: 250 mg/dL — AB
Hgb urine dipstick: NEGATIVE
Ketones, ur: NEGATIVE mg/dL
Nitrite: POSITIVE — AB
Protein, ur: NEGATIVE mg/dL
Specific Gravity, Urine: 1.015 (ref 1.005–1.030)
pH: 6.5 (ref 5.0–8.0)

## 2019-07-29 LAB — URINALYSIS, MICROSCOPIC (REFLEX)

## 2019-07-29 LAB — LIPASE, BLOOD: Lipase: 50 U/L (ref 11–51)

## 2019-07-29 IMAGING — CT CT ABD-PELV W/ CM
2 of 5 series · 16 of 46 positions shown, 18 images · IV contrast (Omnipaque)
Comparison: [DATE]

CLINICAL DATA: Pancreatitis.

EXAM:
CT ABDOMEN AND PELVIS WITH CONTRAST
TECHNIQUE: Multidetector CT imaging of the abdomen and pelvis was performed
using the standard protocol following bolus administration of
intravenous contrast.
CONTRAST:  100mL OMNIPAQUE IOHEXOL 300 MG/ML  SOLN

[Series 2: axial st · axial · 0.89mm/px · z∈[+678,+1108]mm · 13 of 98 slices shown, 15 images]
[im 6/98  soft-tissue]
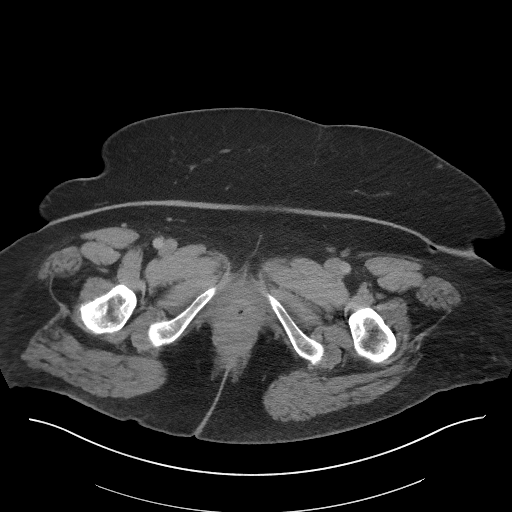
[im 6/98  bone]
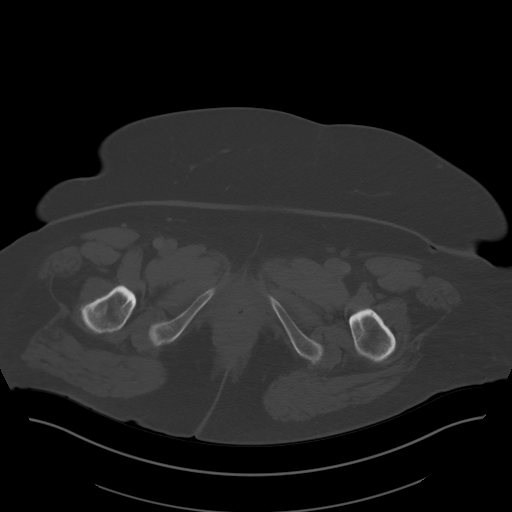
[im 16/98  soft-tissue]
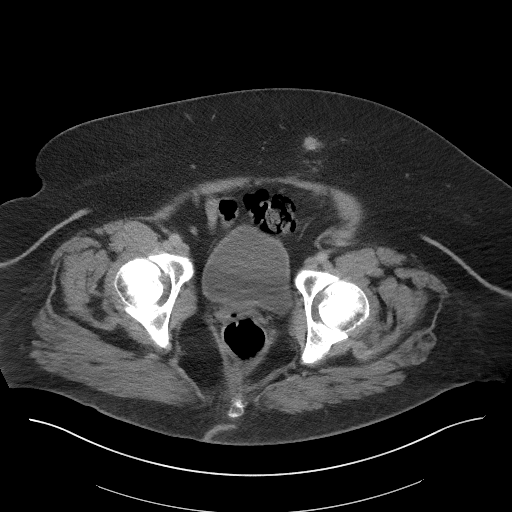
[im 21/98  soft-tissue]
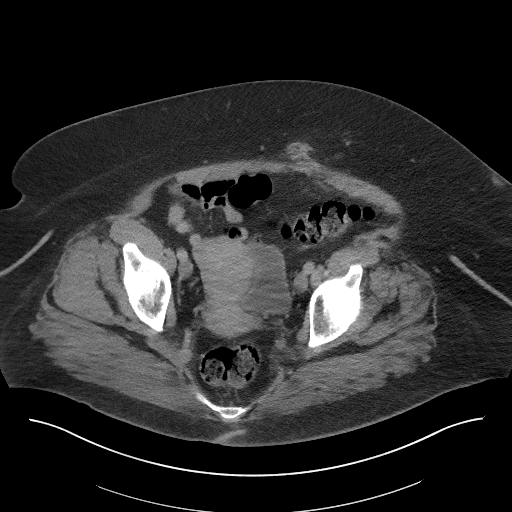
[im 26/98  soft-tissue]
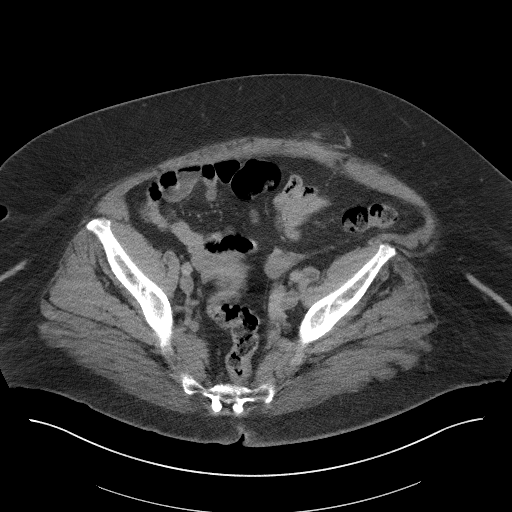
[im 36/98  soft-tissue]
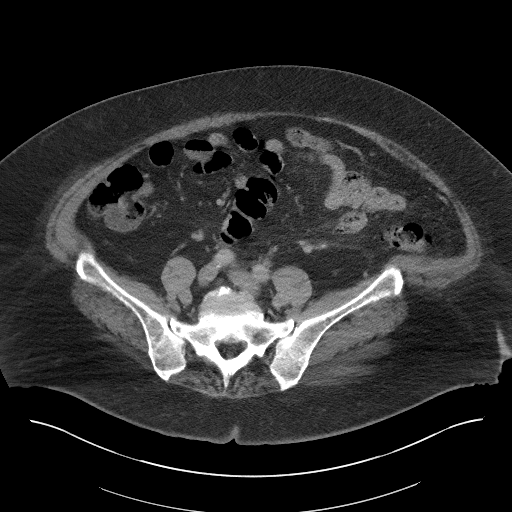
[im 41/98  soft-tissue]
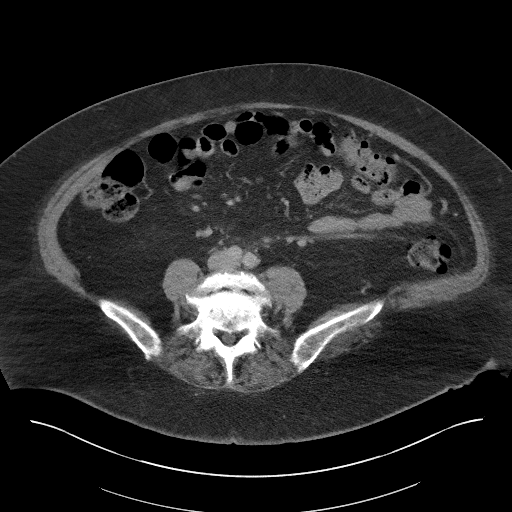
[im 52/98  soft-tissue]
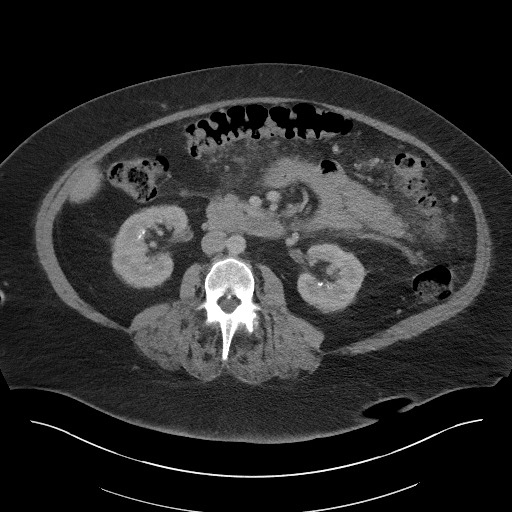
[im 57/98  soft-tissue]
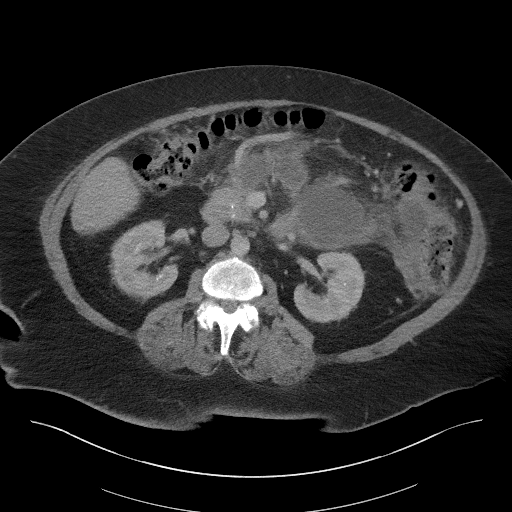
[im 62/98  soft-tissue]
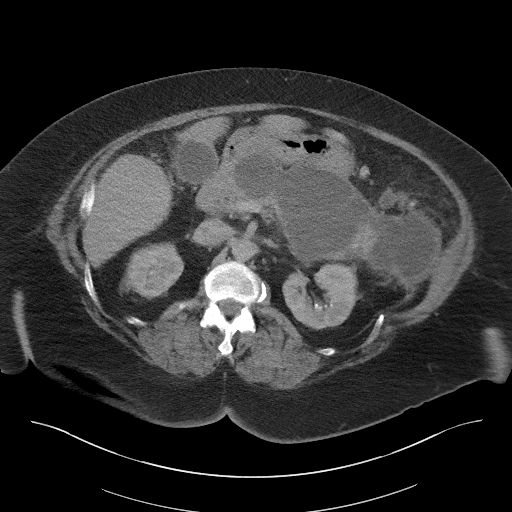
[im 62/98  bone]
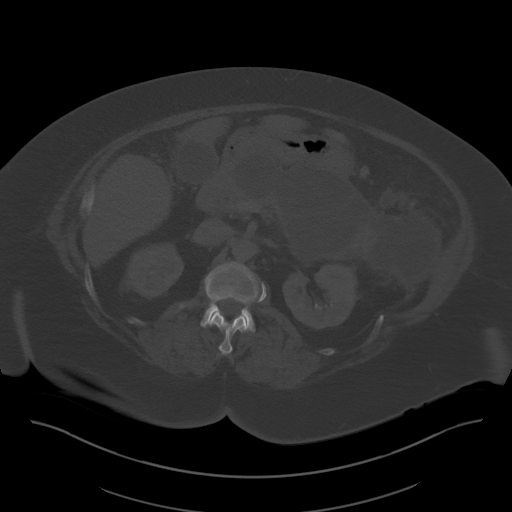
[im 72/98  soft-tissue]
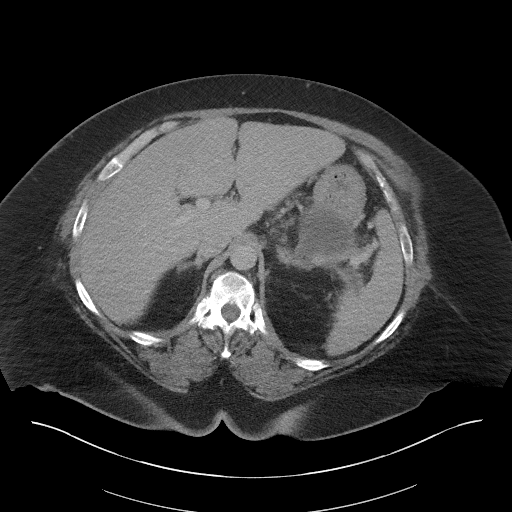
[im 77/98  soft-tissue]
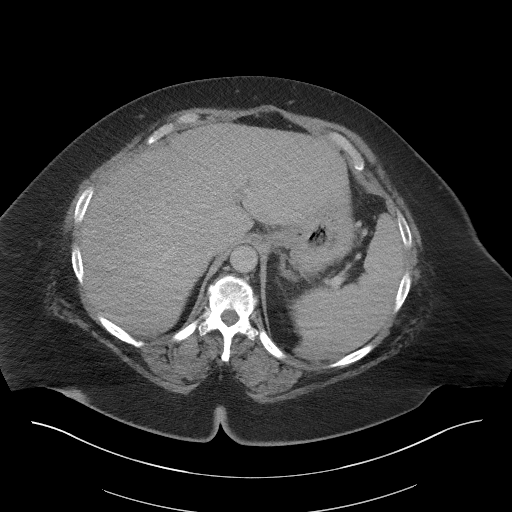
[im 82/98  soft-tissue]
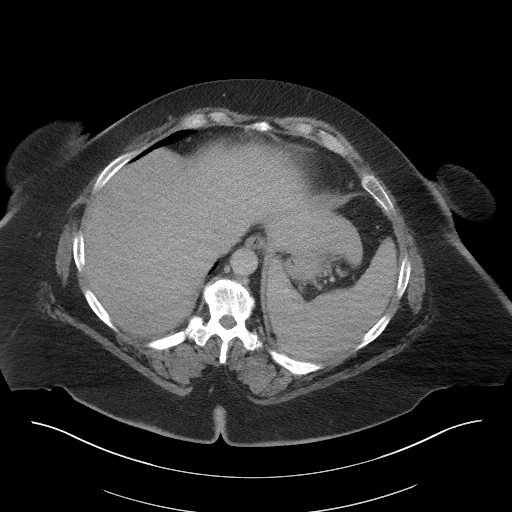
[im 92/98  soft-tissue]
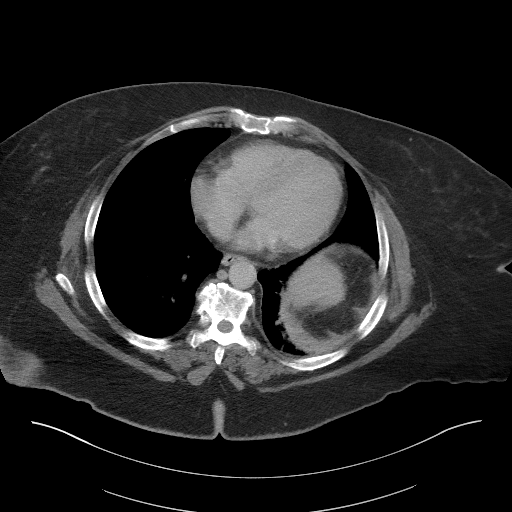

[Series 5: coronal st · coronal · 0.84mm/px · 3 of 115 slices shown]
[im 39/115  soft-tissue]
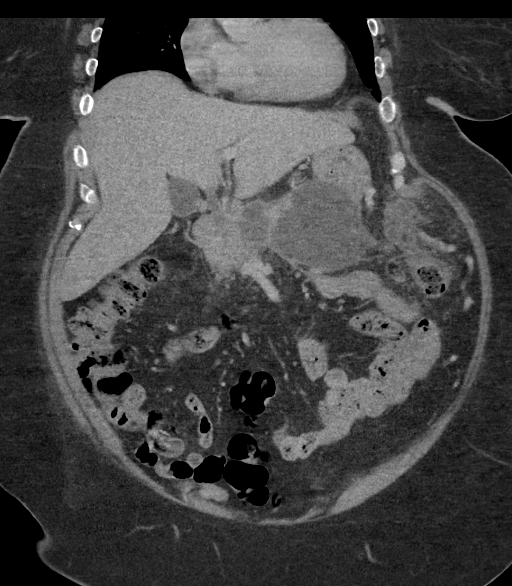
[im 51/115  soft-tissue]
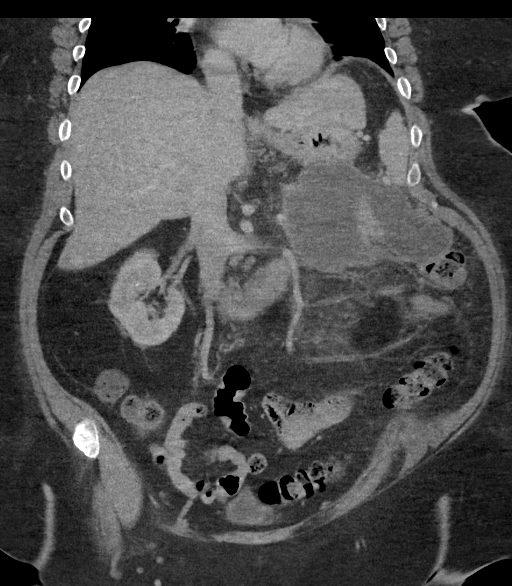
[im 64/115  soft-tissue]
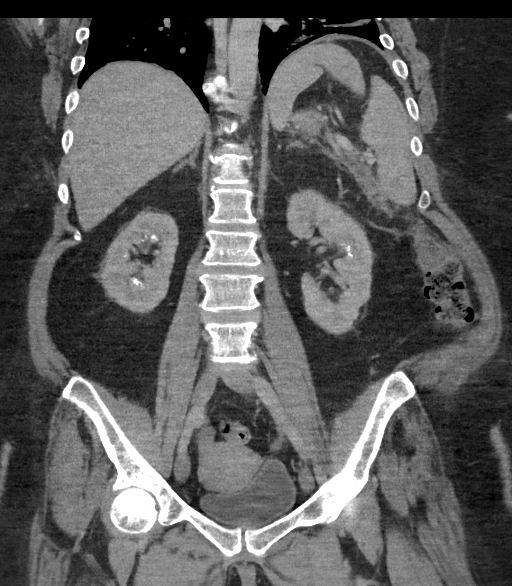

[16 of 46 positions shown; findings below may reference images not displayed]

FINDINGS: Lower chest: There is atelectasis at the left lung base.The heart
size is normal.

Hepatobiliary: The liver is normal. Normal gallbladder.There is no
biliary ductal dilation.

Pancreas: Again noted are large pancreatic fluid collections. The
largest measures approximately 12.4 x 7.5 cm (previously measuring
12.2 x 7.9 cm. The collection at the pancreatic tail measures
approximately 6.1 x 5.3 cm. No new collections are identified on
this exam. Again noted are peripancreatic inflammatory changes
similar to prior study. Calcifications are noted at the pancreatic
head

Spleen: No splenic laceration or hematoma.

Adrenals/Urinary Tract:

--Adrenal glands: No adrenal hemorrhage.

--Right kidney/ureter: Multiple nonobstructing stones are again
noted.

--Left kidney/ureter: Multiple nonobstructing stones are again
noted.

--Urinary bladder: Unremarkable.

Stomach/Bowel:

--Stomach/Duodenum: No hiatal hernia or other gastric abnormality.
Normal duodenal course and caliber.

--Small bowel: No dilatation or inflammation.

--Colon: There is some wall thickening at the level of the splenic
flexure almost certainly reactive from the nearby peripancreatic
fluid collections.

--Appendix: Surgically absent.

Vascular/Lymphatic: Atherosclerotic calcification is present within
the non-aneurysmal abdominal aorta, without hemodynamically
significant stenosis. The splenic vein is patent but significantly
attenuated. The splenic artery appears to be patent but is somewhat
difficult to follow distally.

--No retroperitoneal lymphadenopathy.

--No mesenteric lymphadenopathy.

--No pelvic or inguinal lymphadenopathy.

Reproductive: Unremarkable

Other: No ascites or free air. The abdominal wall is normal.

Musculoskeletal. There is a bilateral pars defect at L4 resulting in
grade 1-2 anterolisthesis of L4 on L5. There is severe disc height
loss at the L4-L5 level. There is mild disc height loss at the L5-S1
level.
IMPRESSION: 1. Again noted are enlarged peripancreatic and intrapancreatic fluid
collections as seen on the patient's prior CT. These collections are
essentially stable from prior study.
2. The splenic vein appears to be patent but again demonstrates
compression from the nearby fluid collections.
3. No evidence for small bowel obstruction.
4. Bilateral nonobstructing nephrolithiasis.
5.  Aortic Atherosclerosis ([V7]-[V7]).

## 2019-07-29 MED ORDER — OXYCODONE HCL 5 MG PO TABS: 5.0000 mg | ORAL_TABLET | ORAL | 0 refills | Status: DC | PRN

## 2019-07-29 MED ORDER — SODIUM CHLORIDE 0.9 % IV BOLUS
1000.0000 mL | Freq: Once | INTRAVENOUS | Status: AC
  Administered 2019-07-29: 1000 mL via INTRAVENOUS

## 2019-07-29 MED ORDER — ONDANSETRON 4 MG PO TBDP: 4.0000 mg | ORAL_TABLET | Freq: Three times a day (TID) | ORAL | 0 refills | Status: DC | PRN

## 2019-07-29 MED ORDER — HYDROCODONE-ACETAMINOPHEN 5-325 MG PO TABS
1.0000 | ORAL_TABLET | Freq: Once | ORAL | Status: AC
  Administered 2019-07-29: 1 via ORAL
  Filled 2019-07-29: qty 1

## 2019-07-29 MED ORDER — SODIUM CHLORIDE 0.9% FLUSH
3.0000 mL | Freq: Once | INTRAVENOUS | Status: DC
  Filled 2019-07-29: qty 3

## 2019-07-29 MED ORDER — IOHEXOL 300 MG/ML  SOLN
100.0000 mL | Freq: Once | INTRAMUSCULAR | Status: AC
  Administered 2019-07-29: 100 mL via INTRAVENOUS

## 2019-07-29 MED ORDER — ONDANSETRON HCL 4 MG/2ML IJ SOLN
4.0000 mg | Freq: Once | INTRAMUSCULAR | Status: AC
  Administered 2019-07-29: 4 mg via INTRAVENOUS
  Filled 2019-07-29: qty 2

## 2019-07-29 MED ORDER — HYDROMORPHONE HCL 1 MG/ML IJ SOLN
1.0000 mg | Freq: Once | INTRAMUSCULAR | Status: AC
  Administered 2019-07-29: 1 mg via INTRAVENOUS
  Filled 2019-07-29: qty 1

## 2019-07-29 NOTE — Discharge Instructions (Signed)
If you develop worsening, continued, or recurrent abdominal pain, uncontrolled vomiting, fever, chest or back pain, or any other new/concerning symptoms then return to the ER for evaluation.  

## 2019-07-29 NOTE — ED Notes (Signed)
PT asking for pain medication. RN informed.

## 2019-07-29 NOTE — ED Provider Notes (Signed)
[facility] EMERGENCY DEPARTMENT Provider Note   CSN: [id number] Arrival date & time: 07/29/19  1714     History Chief Complaint  Patient presents with  . Abdominal Pain    Monique Holland is a 54 y.o. female.  HPI 54 year old female presents with recurrent upper abdominal pain.  Consistent with prior pancreatitis.  Started today along with multiple episodes of vomiting.  No diarrhea or constipation.  No back or chest pain.  No fevers.  Has tried ibuprofen and Tylenol with no relief.  No alcohol use.  Pain is severe.   Past Medical History:  Diagnosis Date  . Anxiety   . Asthma   . Depression   . Diabetes mellitus without complication (HCC)   . Frequency of urination   . GERD (gastroesophageal reflux disease)   . History of asthma    last episode yrs ago  . Hypertension   . OSA (obstructive sleep apnea)    pt had study done oct 2014--  schedule for cpap titrate after kidney stone surgery  . Pancreatitis   . Schizophrenia (HCC)   . Ureteral calculi    BILATERAL  . Wears glasses     Patient Active Problem List   Diagnosis Date Noted  . Pancreatic pseudocyst   . Nausea and vomiting in adult   . Necrotizing pancreatitis   . Gastritis and gastroduodenitis   . Candida esophagitis (HCC)   . Acute pancreatitis 07/17/2019  . Schizophrenia (HCC) 09/02/2018  . Obesity, Class III, BMI 40-49.9 (morbid obesity) (HCC) 09/02/2018  . Iron deficiency 09/02/2018  . Symptomatic anemia 09/01/2018  . Hypokalemia 09/01/2018  . Severe sepsis (HCC) 08/11/2018  . AKI (acute kidney injury) (HCC) 08/11/2018  . Hypotension 08/11/2018  . Type 2 diabetes mellitus (HCC) 08/11/2018  . Acute appendicitis 05/22/2014  . Ureteral calculus, right 05/02/2013    Past Surgical History:  Procedure Laterality Date  . BIOPSY  07/18/2019   Procedure: BIOPSY;  Surgeon: Shellia Cleverly, DO;  Location: [facility];  Service: Gastroenterology;;  . CESAREAN SECTION  1991  &  2002   w/  bilateral tubal ligation in 2002  . CYSTOSCOPY W/ URETERAL STENT PLACEMENT Right 08/10/2018   Procedure: CYSTOSCOPY WITH RETROGRADE PYELOGRAM/URETERAL STENT PLACEMENT;  Surgeon: Jerilee Field, MD;  Location: [facility];  Service: Urology;  Laterality: Right;  . CYSTOSCOPY WITH RETROGRADE PYELOGRAM, URETEROSCOPY AND STENT PLACEMENT Bilateral 05/02/2013   Procedure: CYSTOSCOPY WITH RETROGRADE PYELOGRAM, URETEROSCOPY ;  Surgeon: Valetta Fuller, MD;  Location: [facility];  Service: Urology;  Laterality: Bilateral;  . CYSTOSCOPY WITH RETROGRADE PYELOGRAM, URETEROSCOPY AND STENT PLACEMENT Right 08/15/2018   Procedure: CYSTOSCOPY WITH RIGHT RETROGRADE PYELOGRAM, RIGHT URETEROSCOPY WITH HOLMIUM LASER AND STENT PLACEMENT;  Surgeon: Crista Elliot, MD;  Location: [facility];  Service: Urology;  Laterality: Right;  . CYSTOSCOPY WITH STENT PLACEMENT Left 05/02/2013   Procedure: CYSTOSCOPY WITH STENT PLACEMENT;  Surgeon: Valetta Fuller, MD;  Location: [facility];  Service: Urology;  Laterality: Left;  . ESOPHAGOGASTRODUODENOSCOPY (EGD) WITH PROPOFOL N/A 07/18/2019   Procedure: ESOPHAGOGASTRODUODENOSCOPY (EGD) WITH PROPOFOL;  Surgeon: Shellia Cleverly, DO;  Location: [facility];  Service: Gastroenterology;  Laterality: N/A;  . HOLMIUM LASER APPLICATION Bilateral 05/02/2013   Procedure: HOLMIUM LASER APPLICATION;  Surgeon: Valetta Fuller, MD;  Location: [facility];  Service: Urology;  Laterality: Bilateral;  . LAPAROSCOPIC APPENDECTOMY N/A 05/22/2014   Procedure: APPENDECTOMY LAPAROSCOPIC;  Surgeon: Glenna Fellows, MD;  Location: [facility];  Service: General;  Laterality: N/A;  . TONSILLECTOMY  age 70     OB History   No obstetric history on file.     No family history on file.  Social History   Tobacco Use  . Smoking status: Never Smoker  . Smokeless tobacco: Never Used  Substance Use Topics  . Alcohol use: No  . Drug use: No    Home  Medications Prior to Admission medications   Medication Sig Start Date End Date Taking? Authorizing Provider  acetaminophen (TYLENOL) 325 MG tablet Take 2 tablets (650 mg total) by mouth every 6 (six) hours as needed for mild pain (or Fever >/= 101). 07/21/19   Regalado, Belkys A, MD  albuterol (VENTOLIN HFA) 108 (90 Base) MCG/ACT inhaler Inhale 1-2 puffs into the lungs every 6 (six) hours as needed for wheezing or shortness of breath. 07/21/19   Regalado, Belkys A, MD  amitriptyline (ELAVIL) 100 MG tablet Take 100 mg by mouth at bedtime. 06/05/19   [provider]  clonazePAM (KLONOPIN) 0.5 MG tablet Take 0.5 mg by mouth daily as needed for anxiety. 05/23/19   [provider]  fluconazole (DIFLUCAN) 200 MG tablet Take 1 tablet (200 mg total) by mouth daily for 20 days. 07/21/19 08/10/19  Regalado, Jerald Kief A, MD  lamoTRIgine (LAMICTAL) 200 MG tablet Take 200 mg by mouth daily. 06/06/19   [provider]  LANTUS SOLOSTAR 100 UNIT/ML Solostar Pen Inject 15 Units into the skin at bedtime. 07/21/19   Regalado, Belkys A, MD  lurasidone (LATUDA) 40 MG TABS tablet Take 1 tablet (40 mg total) by mouth at bedtime. 07/21/19   Regalado, Belkys A, MD  OLANZapine (ZYPREXA) 5 MG tablet Take 5 mg by mouth at bedtime. 06/01/19   [provider]  ondansetron (ZOFRAN ODT) 4 MG disintegrating tablet Take 1 tablet (4 mg total) by mouth every 8 (eight) hours as needed. 07/29/19   Sherwood Gambler, MD  oxyCODONE (ROXICODONE) 5 MG immediate release tablet Take 1 tablet (5 mg total) by mouth every 4 (four) hours as needed for severe pain. 07/29/19   Sherwood Gambler, MD  pantoprazole (PROTONIX) 40 MG tablet Take 1 tablet (40 mg total) by mouth 2 (two) times daily. 07/21/19   Regalado, Belkys A, MD  senna (SENOKOT) 8.6 MG TABS tablet Take 1 tablet (8.6 mg total) by mouth 2 (two) times daily. 07/21/19   Regalado, Jerald Kief A, MD  SYMBICORT 160-4.5 MCG/ACT inhaler Inhale 2 puffs into the lungs 2 (two) times daily  as needed (SOB/wheezing).  04/26/19   [provider]  traZODone (DESYREL) 100 MG tablet Take 100 mg by mouth at bedtime.    [provider]  TRULICITY 1.5 JK/0.9FG SOPN Inject 1.5 mg into the skin once a week. 05/02/19   [provider]    Allergies    Patient has no known allergies.  Review of Systems   Review of Systems  Respiratory: Negative for shortness of breath.   Gastrointestinal: Positive for abdominal pain, nausea and vomiting.  Musculoskeletal: Negative for back pain.  All other systems reviewed and are negative.   Physical Exam Updated Vital Signs BP (!) 164/104 (BP Location: Right Wrist)   Pulse (!) 105   Temp 98.4 F (36.9 C) (Oral)   Resp 15   Ht 5\' 9"  (1.753 m)   Wt (!) 137.4 kg   LMP 03/23/2014 Comment: patient states her periods are irregular  SpO2 100%   BMI 44.75 kg/m   Physical Exam Vitals and  nursing note reviewed.  Constitutional:      Appearance: She is well-developed. She is obese.  HENT:     Head: Normocephalic and atraumatic.     Right Ear: External ear normal.     Left Ear: External ear normal.     Nose: Nose normal.  Eyes:     General:        Right eye: No discharge.        Left eye: No discharge.  Cardiovascular:     Rate and Rhythm: Regular rhythm. Tachycardia present.     Heart sounds: Normal heart sounds.  Pulmonary:     Effort: Pulmonary effort is normal.     Breath sounds: Normal breath sounds.  Abdominal:     Palpations: Abdomen is soft.     Tenderness: There is abdominal tenderness in the right upper quadrant, epigastric area and left upper quadrant.  Skin:    General: Skin is warm and dry.  Neurological:     Mental Status: She is alert.  Psychiatric:        Mood and Affect: Mood is not anxious.     ED Results / Procedures / Treatments   Labs (all labs ordered are listed, but only abnormal results are displayed) Labs Reviewed  COMPREHENSIVE METABOLIC PANEL - Abnormal; Notable for the  following components:      Result Value   Sodium 131 (*)    Chloride 97 (*)    Glucose, Bld 361 (*)    All other components within normal limits  CBC - Abnormal; Notable for the following components:   Hemoglobin 10.4 (*)    MCV 77.9 (*)    MCH 22.3 (*)    MCHC 28.7 (*)    RDW 16.2 (*)    All other components within normal limits  URINALYSIS, ROUTINE W REFLEX MICROSCOPIC - Abnormal; Notable for the following components:   APPearance CLOUDY (*)    Glucose, UA 250 (*)    Nitrite POSITIVE (*)    Leukocytes,Ua MODERATE (*)    All other components within normal limits  URINALYSIS, MICROSCOPIC (REFLEX) - Abnormal; Notable for the following components:   Bacteria, UA MANY (*)    All other components within normal limits  LIPASE, BLOOD    EKG None  Radiology CT ABDOMEN PELVIS W CONTRAST  Result Date: 07/29/2019 CLINICAL DATA:  Pancreatitis. EXAM: CT ABDOMEN AND PELVIS WITH CONTRAST TECHNIQUE: Multidetector CT imaging of the abdomen and pelvis was performed using the standard protocol following bolus administration of intravenous contrast. CONTRAST:  OMNIPAQUE IOHEXOL 300 MG/ML  SOLN COMPARISON:  07/17/2019 FINDINGS: Lower chest: There is atelectasis at the left lung base.The heart size is normal. Hepatobiliary: The liver is normal. Normal gallbladder.There is no biliary ductal dilation. Pancreas: Again noted are large pancreatic fluid collections. The largest measures approximately 12.4 x 7.5 cm (previously measuring 12.2 x 7.9 cm. The collection at the pancreatic tail measures approximately 6.1 x 5.3 cm. No new collections are identified on this exam. Again noted are peripancreatic inflammatory changes similar to prior study. Calcifications are noted at the pancreatic head Spleen: No splenic laceration or hematoma. Adrenals/Urinary Tract: --Adrenal glands: No adrenal hemorrhage. --Right kidney/ureter: Multiple nonobstructing stones are again noted. --Left kidney/ureter: Multiple  nonobstructing stones are again noted. --Urinary bladder: Unremarkable. Stomach/Bowel: --Stomach/Duodenum: No hiatal hernia or other gastric abnormality. Normal duodenal course and caliber. --Small bowel: No dilatation or inflammation. --Colon: There is some wall thickening at the level of the splenic flexure almost certainly reactive  from the nearby peripancreatic fluid collections. --Appendix: Surgically absent. Vascular/Lymphatic: Atherosclerotic calcification is present within the non-aneurysmal abdominal aorta, without hemodynamically significant stenosis. The splenic vein is patent but significantly attenuated. The splenic artery appears to be patent but is somewhat difficult to follow distally. --No retroperitoneal lymphadenopathy. --No mesenteric lymphadenopathy. --No pelvic or inguinal lymphadenopathy. Reproductive: Unremarkable Other: No ascites or free air. The abdominal wall is normal. Musculoskeletal. 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 There is severe disc height loss at the L4-L5 level. There is mild disc height loss at the L5-S1 level. IMPRESSION: 1. Again noted are enlarged peripancreatic and intrapancreatic fluid collections as seen on the patient's prior CT. These collections are essentially stable from prior study. 2. The splenic vein appears to be patent but again demonstrates compression from the nearby fluid collections. 3. No evidence for small bowel obstruction. 4. Bilateral nonobstructing nephrolithiasis. 5.  Aortic Atherosclerosis (ICD10-I70.0). Electronically Signed   By: Katherine Mantle M.D.   On: 07/29/2019 21:23    Procedures Procedures (including critical care time)  Medications Ordered in ED Medications  HYDROmorphone (DILAUDID) injection 1 mg (1 mg Intravenous Given 07/29/19 2043)  sodium chloride 0.9 % bolus 1,000 mL ( Intravenous Stopped 07/29/19 2154)  ondansetron (ZOFRAN) injection 4 mg (4 mg Intravenous Given  07/29/19 2041)  iohexol (OMNIPAQUE) 300 MG/ML solution 100 mL (100 mLs Intravenous Contrast Given 07/29/19 2100)  HYDROcodone-acetaminophen (NORCO/VICODIN) 5-325 MG per tablet 1 tablet (1 tablet Oral Given 07/29/19 2220)    ED Course  I have reviewed the triage vital signs and the nursing notes.  Pertinent labs & imaging results that were available during my care of the patient were reviewed by me and considered in my medical decision making (see chart for details).    MDM Rules/Calculators/A&P                      Patient's pain is under control after IV meds. No vomiting. Has hyperglycemia but otherwise reassuring labs. Given history, CT obtained. Pancreatic fluid collection is similar to before. Doubt more severe cause such as infection/necrosis. Given good pain control and having GI follow up in about a week, I think she is stable for discharge. Discussed return precautions.  Final Clinical Impression(s) / ED Diagnoses Final diagnoses:  Acute pancreatitis without infection or necrosis, unspecified pancreatitis type    Rx / DC Orders ED Discharge Orders         Ordered    oxyCODONE (ROXICODONE) 5 MG immediate release tablet  Every 4 hours PRN     07/29/19 2212    ondansetron (ZOFRAN ODT) 4 MG disintegrating tablet  Every 8 hours PRN     07/29/19 2212           Pricilla Loveless, MD 07/30/19 [phone]

## 2019-07-29 NOTE — ED Triage Notes (Signed)
States she has pancreatitis. Abdominal pain. Vomiting.

## 2019-08-01 ENCOUNTER — Encounter: Payer: Self-pay | Admitting: Nurse Practitioner

## 2019-08-02 ENCOUNTER — Other Ambulatory Visit: Payer: Self-pay

## 2019-08-02 ENCOUNTER — Inpatient Hospital Stay (HOSPITAL_COMMUNITY)
Admission: EM | Admit: 2019-08-02 | Discharge: 2019-08-06 | DRG: 369 | Disposition: A | Discharge status: Home / Self Care | Payer: 59 | Attending: Internal Medicine | Admitting: Internal Medicine

## 2019-08-02 ENCOUNTER — Encounter (HOSPITAL_COMMUNITY): Payer: Self-pay

## 2019-08-02 DIAGNOSIS — B3781 Candidal esophagitis: Secondary | ICD-10-CM | POA: Diagnosis not present

## 2019-08-02 DIAGNOSIS — E892 Postprocedural hypoparathyroidism: Secondary | ICD-10-CM | POA: Diagnosis present

## 2019-08-02 DIAGNOSIS — F209 Schizophrenia, unspecified: Secondary | ICD-10-CM | POA: Diagnosis present

## 2019-08-02 DIAGNOSIS — D638 Anemia in other chronic diseases classified elsewhere: Secondary | ICD-10-CM | POA: Diagnosis present

## 2019-08-02 DIAGNOSIS — Z20822 Contact with and (suspected) exposure to covid-19: Secondary | ICD-10-CM | POA: Diagnosis present

## 2019-08-02 DIAGNOSIS — F419 Anxiety disorder, unspecified: Secondary | ICD-10-CM | POA: Diagnosis present

## 2019-08-02 DIAGNOSIS — Z6841 Body Mass Index (BMI) 40.0 and over, adult: Secondary | ICD-10-CM

## 2019-08-02 DIAGNOSIS — K219 Gastro-esophageal reflux disease without esophagitis: Secondary | ICD-10-CM | POA: Diagnosis present

## 2019-08-02 DIAGNOSIS — I10 Essential (primary) hypertension: Secondary | ICD-10-CM | POA: Diagnosis present

## 2019-08-02 DIAGNOSIS — F319 Bipolar disorder, unspecified: Secondary | ICD-10-CM | POA: Diagnosis present

## 2019-08-02 DIAGNOSIS — Z79899 Other long term (current) drug therapy: Secondary | ICD-10-CM

## 2019-08-02 DIAGNOSIS — K859 Acute pancreatitis without necrosis or infection, unspecified: Secondary | ICD-10-CM

## 2019-08-02 DIAGNOSIS — K863 Pseudocyst of pancreas: Secondary | ICD-10-CM | POA: Diagnosis present

## 2019-08-02 DIAGNOSIS — Z713 Dietary counseling and surveillance: Secondary | ICD-10-CM

## 2019-08-02 DIAGNOSIS — Z7951 Long term (current) use of inhaled steroids: Secondary | ICD-10-CM

## 2019-08-02 DIAGNOSIS — Z960 Presence of urogenital implants: Secondary | ICD-10-CM | POA: Diagnosis present

## 2019-08-02 DIAGNOSIS — N390 Urinary tract infection, site not specified: Secondary | ICD-10-CM | POA: Diagnosis present

## 2019-08-02 DIAGNOSIS — K2970 Gastritis, unspecified, without bleeding: Secondary | ICD-10-CM | POA: Diagnosis present

## 2019-08-02 DIAGNOSIS — R109 Unspecified abdominal pain: Secondary | ICD-10-CM

## 2019-08-02 DIAGNOSIS — E119 Type 2 diabetes mellitus without complications: Secondary | ICD-10-CM

## 2019-08-02 DIAGNOSIS — Z87442 Personal history of urinary calculi: Secondary | ICD-10-CM

## 2019-08-02 DIAGNOSIS — K861 Other chronic pancreatitis: Secondary | ICD-10-CM | POA: Diagnosis present

## 2019-08-02 DIAGNOSIS — K8681 Exocrine pancreatic insufficiency: Secondary | ICD-10-CM | POA: Diagnosis present

## 2019-08-02 DIAGNOSIS — E1165 Type 2 diabetes mellitus with hyperglycemia: Secondary | ICD-10-CM | POA: Diagnosis present

## 2019-08-02 DIAGNOSIS — Z794 Long term (current) use of insulin: Secondary | ICD-10-CM

## 2019-08-02 DIAGNOSIS — Z9981 Dependence on supplemental oxygen: Secondary | ICD-10-CM

## 2019-08-02 DIAGNOSIS — G4733 Obstructive sleep apnea (adult) (pediatric): Secondary | ICD-10-CM | POA: Diagnosis present

## 2019-08-02 LAB — CBC
HCT: 35.4 % — ABNORMAL LOW (ref 36.0–46.0)
Hemoglobin: 10.2 g/dL — ABNORMAL LOW (ref 12.0–15.0)
MCH: 22.7 pg — ABNORMAL LOW (ref 26.0–34.0)
MCHC: 28.8 g/dL — ABNORMAL LOW (ref 30.0–36.0)
MCV: 78.7 fL — ABNORMAL LOW (ref 80.0–100.0)
Platelets: 268 10*3/uL (ref 150–400)
RBC: 4.5 MIL/uL (ref 3.87–5.11)
RDW: 15.9 % — ABNORMAL HIGH (ref 11.5–15.5)
WBC: 5.7 10*3/uL (ref 4.0–10.5)
nRBC: 0 % (ref 0.0–0.2)

## 2019-08-02 LAB — COMPREHENSIVE METABOLIC PANEL
ALT: 15 U/L (ref 0–44)
AST: 23 U/L (ref 15–41)
Albumin: 3.7 g/dL (ref 3.5–5.0)
Alkaline Phosphatase: 76 U/L (ref 38–126)
Anion gap: 11 (ref 5–15)
BUN: 8 mg/dL (ref 6–20)
CO2: 26 mmol/L (ref 22–32)
Calcium: 9 mg/dL (ref 8.9–10.3)
Chloride: 100 mmol/L (ref 98–111)
Creatinine, Ser: 0.82 mg/dL (ref 0.44–1.00)
GFR calc Af Amer: >60 mL/min (ref >60)
GFR calc non Af Amer: >60 mL/min (ref >60)
Glucose, Bld: 253 mg/dL — ABNORMAL HIGH (ref 70–99)
Potassium: 3.5 mmol/L (ref 3.5–5.1)
Sodium: 137 mmol/L (ref 135–145)
Total Bilirubin: 0.4 mg/dL (ref 0.3–1.2)
Total Protein: 7.2 g/dL (ref 6.5–8.1)

## 2019-08-02 LAB — LIPASE, BLOOD: Lipase: 39 U/L (ref 11–51)

## 2019-08-02 NOTE — ED Triage Notes (Addendum)
Arrived by [facility] with c/o diffuse abdominal pain, nausea and vomiting for past 4 hours. Patient reports hx of pancreatitis. Patient states she was here 2 weeks ago with diverticulitis. Patient reports taking 5 mg Oxycodone at 1900

## 2019-08-03 ENCOUNTER — Other Ambulatory Visit: Payer: Self-pay

## 2019-08-03 ENCOUNTER — Inpatient Hospital Stay (HOSPITAL_COMMUNITY): Payer: 59

## 2019-08-03 ENCOUNTER — Encounter (HOSPITAL_COMMUNITY): Payer: Self-pay | Admitting: Internal Medicine

## 2019-08-03 DIAGNOSIS — K859 Acute pancreatitis without necrosis or infection, unspecified: Secondary | ICD-10-CM | POA: Diagnosis not present

## 2019-08-03 DIAGNOSIS — K2970 Gastritis, unspecified, without bleeding: Secondary | ICD-10-CM | POA: Diagnosis present

## 2019-08-03 DIAGNOSIS — E892 Postprocedural hypoparathyroidism: Secondary | ICD-10-CM | POA: Diagnosis present

## 2019-08-03 DIAGNOSIS — R1010 Upper abdominal pain, unspecified: Secondary | ICD-10-CM | POA: Diagnosis not present

## 2019-08-03 DIAGNOSIS — F319 Bipolar disorder, unspecified: Secondary | ICD-10-CM | POA: Diagnosis present

## 2019-08-03 DIAGNOSIS — E1169 Type 2 diabetes mellitus with other specified complication: Secondary | ICD-10-CM | POA: Diagnosis not present

## 2019-08-03 DIAGNOSIS — Z20822 Contact with and (suspected) exposure to covid-19: Secondary | ICD-10-CM | POA: Diagnosis present

## 2019-08-03 DIAGNOSIS — F10929 Alcohol use, unspecified with intoxication, unspecified: Secondary | ICD-10-CM | POA: Diagnosis not present

## 2019-08-03 DIAGNOSIS — K861 Other chronic pancreatitis: Secondary | ICD-10-CM | POA: Diagnosis present

## 2019-08-03 DIAGNOSIS — G4733 Obstructive sleep apnea (adult) (pediatric): Secondary | ICD-10-CM | POA: Diagnosis present

## 2019-08-03 DIAGNOSIS — Z794 Long term (current) use of insulin: Secondary | ICD-10-CM | POA: Diagnosis not present

## 2019-08-03 DIAGNOSIS — Z7951 Long term (current) use of inhaled steroids: Secondary | ICD-10-CM | POA: Diagnosis not present

## 2019-08-03 DIAGNOSIS — K860 Alcohol-induced chronic pancreatitis: Secondary | ICD-10-CM | POA: Diagnosis not present

## 2019-08-03 DIAGNOSIS — Z79899 Other long term (current) drug therapy: Secondary | ICD-10-CM | POA: Diagnosis not present

## 2019-08-03 DIAGNOSIS — N390 Urinary tract infection, site not specified: Secondary | ICD-10-CM | POA: Diagnosis present

## 2019-08-03 DIAGNOSIS — B3781 Candidal esophagitis: Secondary | ICD-10-CM | POA: Diagnosis present

## 2019-08-03 DIAGNOSIS — I10 Essential (primary) hypertension: Secondary | ICD-10-CM | POA: Diagnosis present

## 2019-08-03 DIAGNOSIS — Z9981 Dependence on supplemental oxygen: Secondary | ICD-10-CM | POA: Diagnosis not present

## 2019-08-03 DIAGNOSIS — K863 Pseudocyst of pancreas: Secondary | ICD-10-CM | POA: Diagnosis present

## 2019-08-03 DIAGNOSIS — R109 Unspecified abdominal pain: Secondary | ICD-10-CM

## 2019-08-03 DIAGNOSIS — Z6841 Body Mass Index (BMI) 40.0 and over, adult: Secondary | ICD-10-CM | POA: Diagnosis not present

## 2019-08-03 DIAGNOSIS — Z87442 Personal history of urinary calculi: Secondary | ICD-10-CM | POA: Diagnosis not present

## 2019-08-03 DIAGNOSIS — F419 Anxiety disorder, unspecified: Secondary | ICD-10-CM | POA: Diagnosis present

## 2019-08-03 DIAGNOSIS — Z960 Presence of urogenital implants: Secondary | ICD-10-CM | POA: Diagnosis present

## 2019-08-03 DIAGNOSIS — F209 Schizophrenia, unspecified: Secondary | ICD-10-CM | POA: Diagnosis present

## 2019-08-03 DIAGNOSIS — K8681 Exocrine pancreatic insufficiency: Secondary | ICD-10-CM | POA: Diagnosis present

## 2019-08-03 DIAGNOSIS — D638 Anemia in other chronic diseases classified elsewhere: Secondary | ICD-10-CM | POA: Diagnosis present

## 2019-08-03 DIAGNOSIS — K219 Gastro-esophageal reflux disease without esophagitis: Secondary | ICD-10-CM | POA: Diagnosis present

## 2019-08-03 DIAGNOSIS — E1165 Type 2 diabetes mellitus with hyperglycemia: Secondary | ICD-10-CM | POA: Diagnosis present

## 2019-08-03 LAB — CBC
HCT: 33.1 % — ABNORMAL LOW (ref 36.0–46.0)
Hemoglobin: 9.3 g/dL — ABNORMAL LOW (ref 12.0–15.0)
MCH: 22.2 pg — ABNORMAL LOW (ref 26.0–34.0)
MCHC: 28.1 g/dL — ABNORMAL LOW (ref 30.0–36.0)
MCV: 79.2 fL — ABNORMAL LOW (ref 80.0–100.0)
Platelets: 222 10*3/uL (ref 150–400)
RBC: 4.18 MIL/uL (ref 3.87–5.11)
RDW: 15.9 % — ABNORMAL HIGH (ref 11.5–15.5)
WBC: 5.4 10*3/uL (ref 4.0–10.5)
nRBC: 0 % (ref 0.0–0.2)

## 2019-08-03 LAB — CBG MONITORING, ED
Glucose-Capillary: 198 mg/dL — ABNORMAL HIGH (ref 70–99)
Glucose-Capillary: 181 mg/dL — ABNORMAL HIGH (ref 70–99)
Glucose-Capillary: 161 mg/dL — ABNORMAL HIGH (ref 70–99)

## 2019-08-03 LAB — URINALYSIS, ROUTINE W REFLEX MICROSCOPIC
Bilirubin Urine: NEGATIVE
Glucose, UA: NEGATIVE mg/dL
Ketones, ur: NEGATIVE mg/dL
Nitrite: NEGATIVE
Protein, ur: 100 mg/dL — AB
Specific Gravity, Urine: 1.011 (ref 1.005–1.030)
WBC, UA: >50 WBC/hpf — ABNORMAL HIGH (ref 0–5)
pH: 6 (ref 5.0–8.0)

## 2019-08-03 LAB — BASIC METABOLIC PANEL
Anion gap: 8 (ref 5–15)
BUN: 8 mg/dL (ref 6–20)
CO2: 26 mmol/L (ref 22–32)
Calcium: 8.9 mg/dL (ref 8.9–10.3)
Chloride: 104 mmol/L (ref 98–111)
Creatinine, Ser: 0.72 mg/dL (ref 0.44–1.00)
GFR calc Af Amer: >60 mL/min (ref >60)
GFR calc non Af Amer: >60 mL/min (ref >60)
Glucose, Bld: 246 mg/dL — ABNORMAL HIGH (ref 70–99)
Potassium: 3.7 mmol/L (ref 3.5–5.1)
Sodium: 138 mmol/L (ref 135–145)

## 2019-08-03 LAB — HEPATIC FUNCTION PANEL
ALT: 13 U/L (ref 0–44)
AST: 21 U/L (ref 15–41)
Albumin: 3.4 g/dL — ABNORMAL LOW (ref 3.5–5.0)
Alkaline Phosphatase: 66 U/L (ref 38–126)
Bilirubin, Direct: 0.1 mg/dL (ref 0.0–0.2)
Indirect Bilirubin: 0.4 mg/dL (ref 0.3–0.9)
Total Bilirubin: 0.5 mg/dL (ref 0.3–1.2)
Total Protein: 6.6 g/dL (ref 6.5–8.1)

## 2019-08-03 LAB — GLUCOSE, CAPILLARY
Glucose-Capillary: 144 mg/dL — ABNORMAL HIGH (ref 70–99)
Glucose-Capillary: 231 mg/dL — ABNORMAL HIGH (ref 70–99)

## 2019-08-03 LAB — SARS CORONAVIRUS 2 (TAT 6-24 HRS): SARS Coronavirus 2: NEGATIVE

## 2019-08-03 IMAGING — MR MR MRCP
9 of 12 series · 32 of 48 positions shown · non-contrast
Comparison: CT abdomen/pelvis dated [DATE]

CLINICAL DATA: Upper abdominal pain with vomiting, follow-up
pancreatic cyst/pseudocyst

EXAM:
MRI ABDOMEN WITHOUT CONTRAST  (INCLUDING MRCP)
TECHNIQUE: Multiplanar multisequence MR imaging of the abdomen was performed.
Heavily T2-weighted images of the biliary and pancreatic ducts were
obtained, and three-dimensional MRCP images were rendered by post
processing.

[Series 4: T2 fat-sat · axial · 7.0mm · 1.56mm/px · z∈[-171,+190]mm · 3 of 44 slices shown]
[im 1/44]
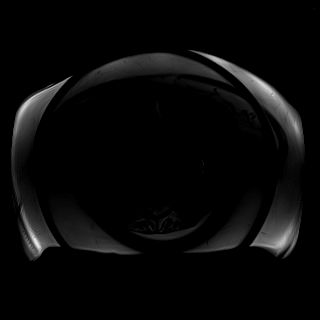
[im 22/44]
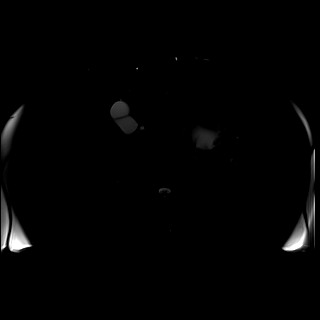
[im 44/44]
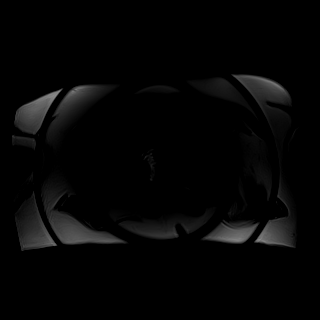

[Series 5: DWI · axial · 7.0mm · 1.84mm/px · z∈[-176,+185]mm · 6 of 88 slices shown (1 of 2)]
[im 1/88]
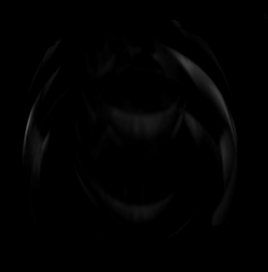
[im 18/88]
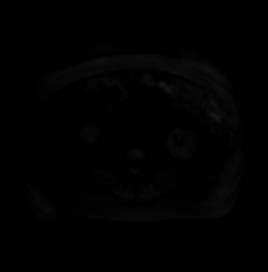
[im 35/88]
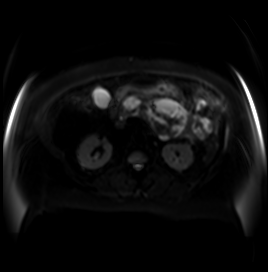
[im 53/88]
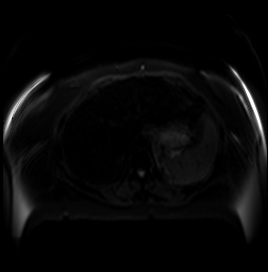
[im 70/88]
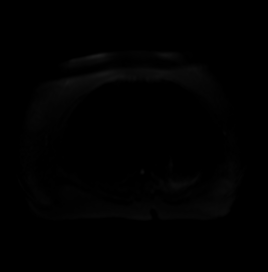
[im 88/88]
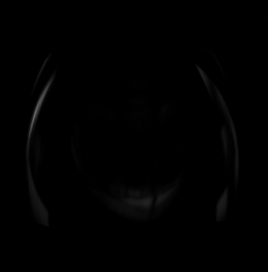

[Series 6: DWI · axial · 7.0mm · 1.84mm/px · z∈[-176,+185]mm · 3 of 44 slices shown (2 of 2)]
[im 1/44]
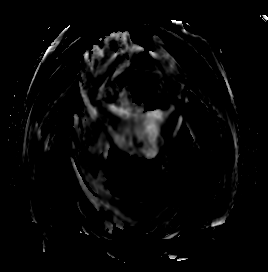
[im 22/44]
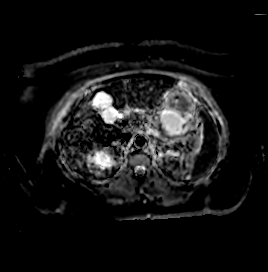
[im 44/44]
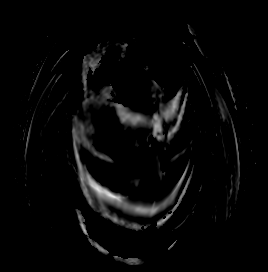

[Series 7: T1 · axial · 4.0mm · 1.56mm/px · z∈[-169,+179]mm · 6 of 88 slices shown (1 of 2)]
[im 1/88]
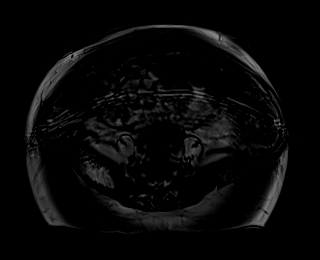
[im 18/88]
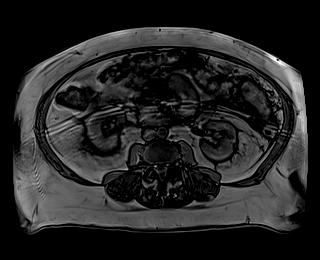
[im 35/88]
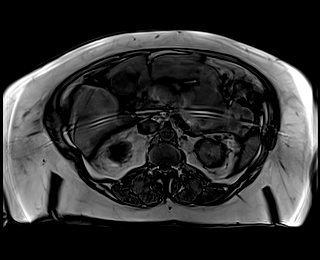
[im 53/88]
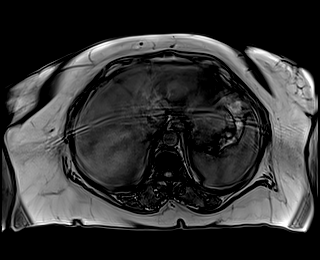
[im 70/88]
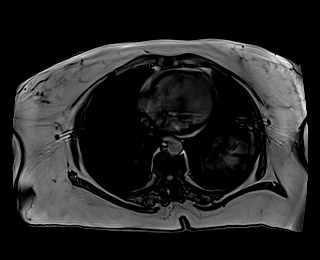
[im 88/88]
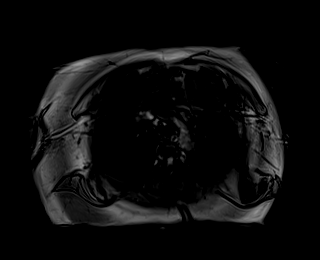

[Series 8: T1 · axial · 4.0mm · 1.56mm/px · z∈[-169,+179]mm · 6 of 88 slices shown (2 of 2)]
[im 1/88]
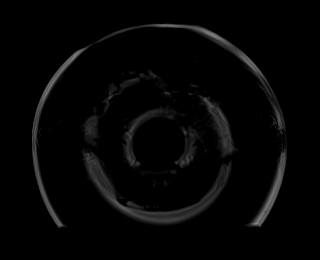
[im 18/88]
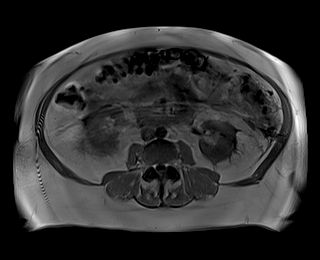
[im 35/88]
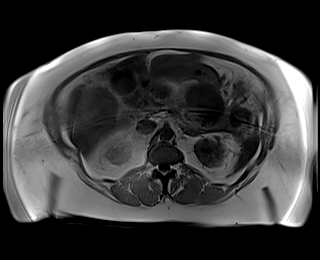
[im 53/88]
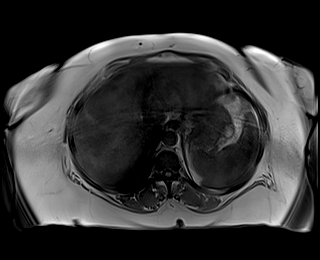
[im 70/88]
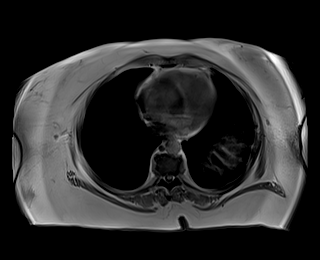
[im 88/88]
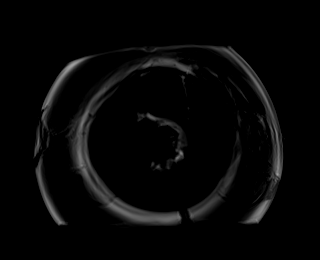

[Series 10: bSSFP · coronal · 7.0mm · 0.98mm/px · 3 of 40 slices shown]
[im 1/40]
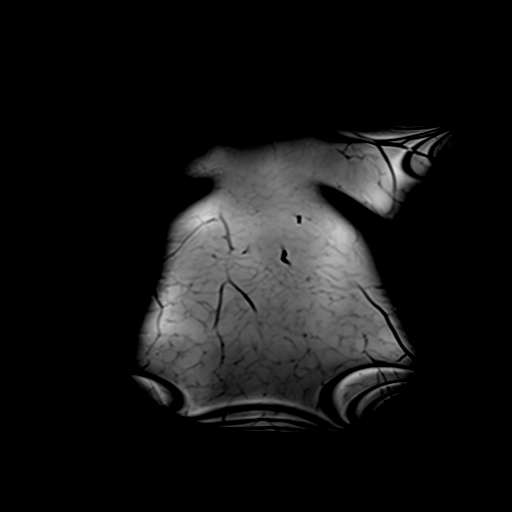
[im 20/40]
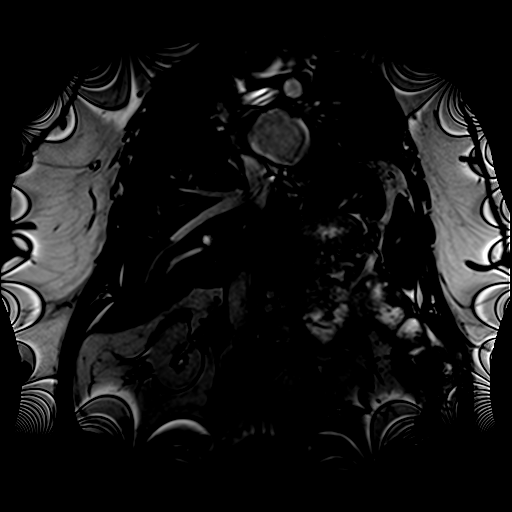
[im 40/40]
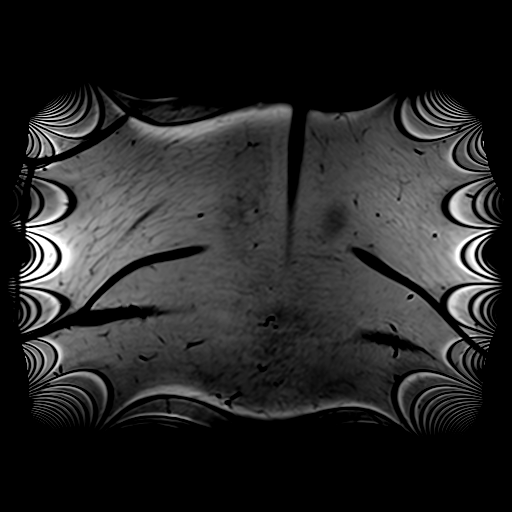

[Series 11: cor obl thk · sagittal · 50.0mm · 1.04mm/px · 1 of 9 slices shown]
[im 1/9]
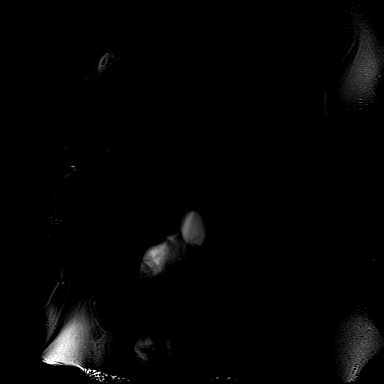

[Series 12: cor_3d_spc_trig-resp · 1 of 10 slices shown]
[im 1/10]
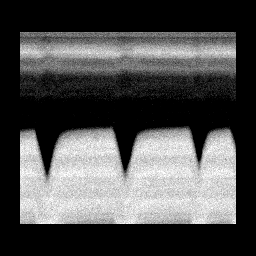

[Series 13: cor_3d_spc_trig · coronal · 2.0mm · 0.65mm/px · 3 of 72 slices shown]
[im 1/72]
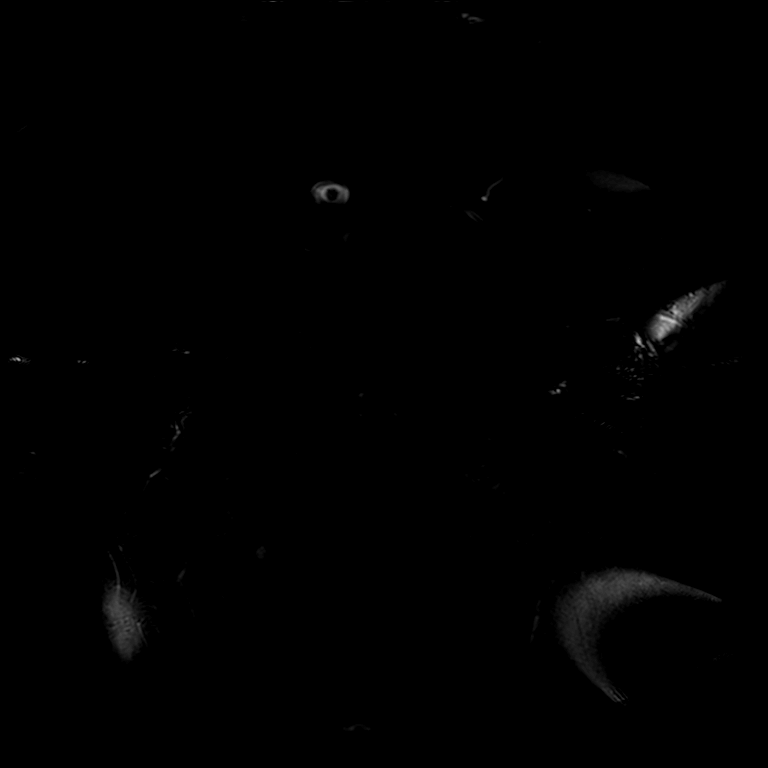
[im 18/72]
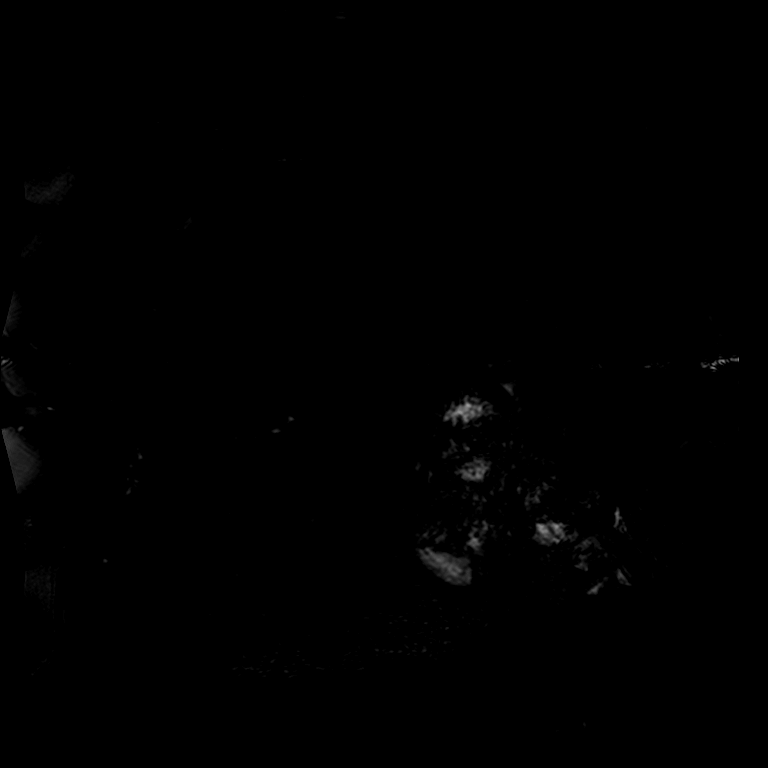
[im 36/72]
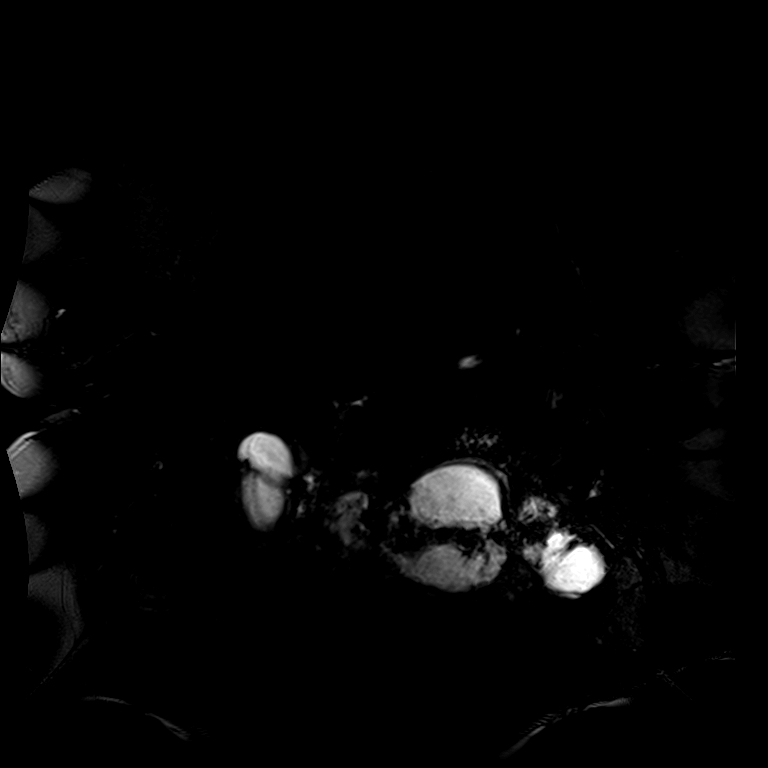

[32 of 48 positions shown; findings below may reference images not displayed]

FINDINGS: Lower chest: Lung bases are clear.

Hepatobiliary: Liver is within normal limits. No focal hepatic
lesions. No morphologic findings of cirrhosis. No hepatic steatosis.

Gallbladder is unremarkable. No intrahepatic or extrahepatic ductal
dilatation. Common duct measures 5 mm. No choledocholithiasis is
seen.

Pancreas: Again noted are peripancreatic fluid/inflammatory changes,
predominantly along the pancreatic tail. Two dominant pancreatic
fluid collection/pseudocysts, including a bilobed collection
measuring 7.9 x 12.6 cm along the pancreatic body/tail (series
16/image 26) and a 4.9 x 6.0 cm collection along the pancreatic tail
adjacent to the splenic flexure of colon (series 16/image 30). These
are grossly unchanged from recent CT, measuring 7.5 x 12.4 cm and
5.3 x 6.1 cm respectively. Additional 2.1 x 2.5 cm fluid collection
in the left anterior abdomen is unchanged. Known parenchymal
calcifications in the pancreatic head/uncinate process are not well
visualized on the current study.

Spleen:  Within normal limits.

Adrenals/Urinary Tract:  Adrenal glands are within normal limits.

Kidneys are within normal limits. Known nonobstructing calculi are
not evident on MR. No hydronephrosis.

Stomach/Bowel: Stomach is within normal limits.

Visualized bowel is grossly unremarkable.

Vascular/Lymphatic:  No evidence of abdominal aortic aneurysm.

No suspicious abdominal lymphadenopathy.

Other:  No abdominal ascites.

Musculoskeletal: No focal osseous lesions.
IMPRESSION: Sequela of prior/chronic pancreatitis, including a dominant 12.6 cm
fluid collection/pseudocyst along the pancreatic body/tail,
unchanged from recent CT. Mild peripancreatic inflammatory
changes/fluid.

No cholelithiasis or choledocholithiasis is evident on MR.

## 2019-08-03 IMAGING — MR MR 3D RECON AT SCANNER
9 of 12 series · 32 of 48 positions shown · non-contrast
Comparison: CT abdomen/pelvis dated [DATE]

CLINICAL DATA: Upper abdominal pain with vomiting, follow-up
pancreatic cyst/pseudocyst

EXAM:
MRI ABDOMEN WITHOUT CONTRAST  (INCLUDING MRCP)
TECHNIQUE: Multiplanar multisequence MR imaging of the abdomen was performed.
Heavily T2-weighted images of the biliary and pancreatic ducts were
obtained, and three-dimensional MRCP images were rendered by post
processing.

[Series 5: T2 fat-sat · axial · 7.0mm · 1.56mm/px · z∈[-171,+190]mm · 3 of 44 slices shown]
[im 1/44]
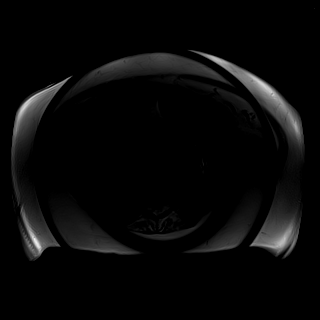
[im 22/44]
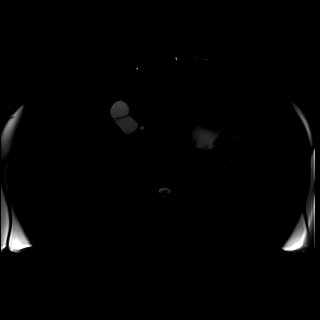
[im 44/44]
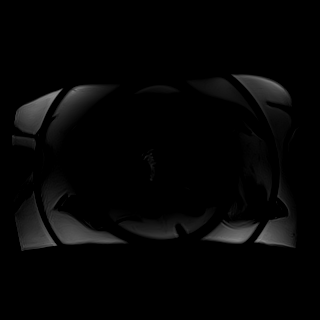

[Series 6: DWI · axial · 7.0mm · 1.84mm/px · z∈[-176,+185]mm · 6 of 88 slices shown (1 of 2)]
[im 1/88]
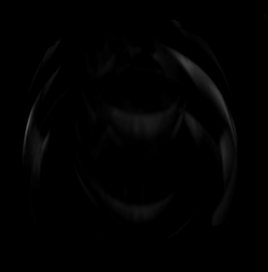
[im 18/88]
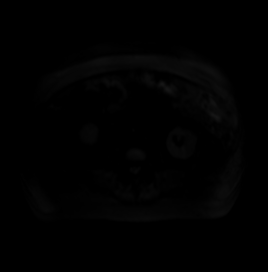
[im 35/88]
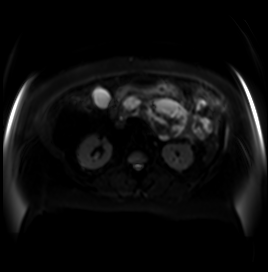
[im 53/88]
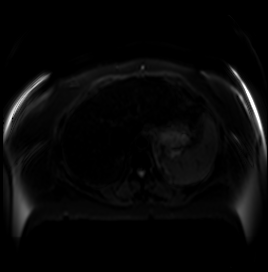
[im 70/88]
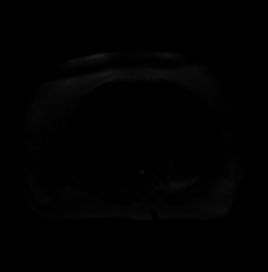
[im 88/88]
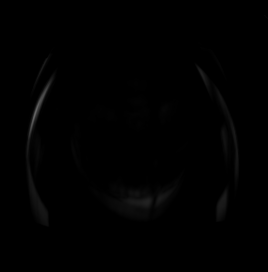

[Series 7: DWI · axial · 7.0mm · 1.84mm/px · z∈[-176,+185]mm · 3 of 44 slices shown (2 of 2)]
[im 1/44]
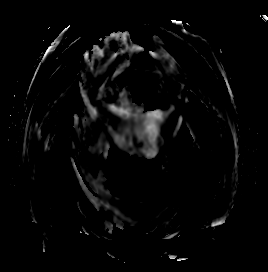
[im 22/44]
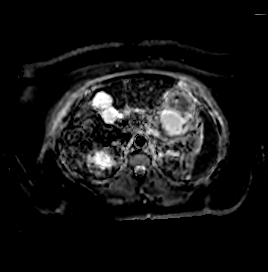
[im 44/44]
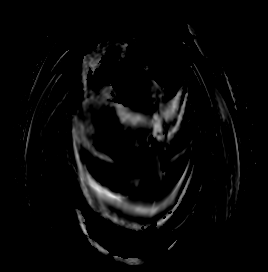

[Series 8: T1 · axial · 4.0mm · 1.56mm/px · z∈[-169,+179]mm · 6 of 88 slices shown (1 of 2)]
[im 1/88]
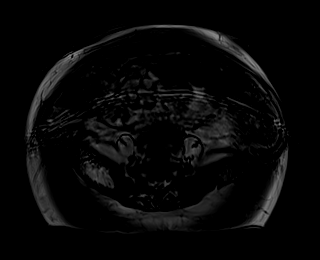
[im 18/88]
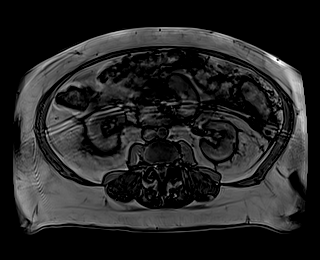
[im 35/88]
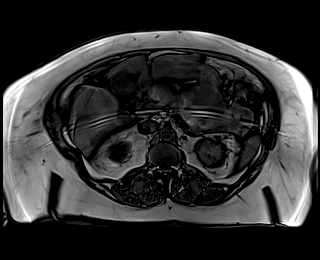
[im 53/88]
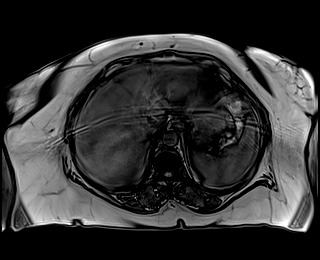
[im 70/88]
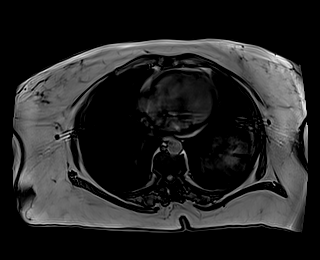
[im 88/88]
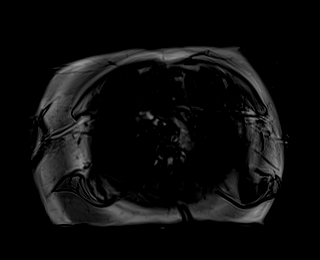

[Series 9: T1 · axial · 4.0mm · 1.56mm/px · z∈[-169,+179]mm · 6 of 88 slices shown (2 of 2)]
[im 1/88]
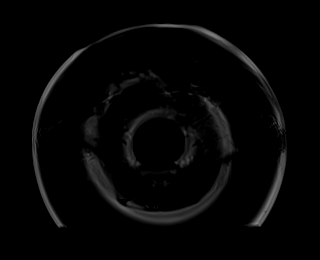
[im 18/88]
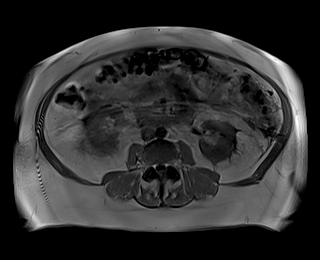
[im 35/88]
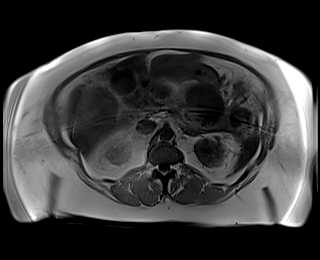
[im 53/88]
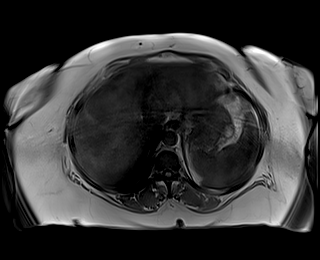
[im 70/88]
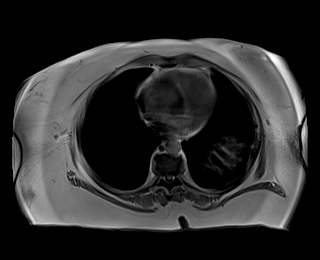
[im 88/88]
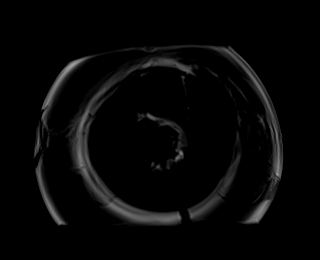

[Series 11: bSSFP · coronal · 7.0mm · 0.98mm/px · 3 of 40 slices shown]
[im 1/40]
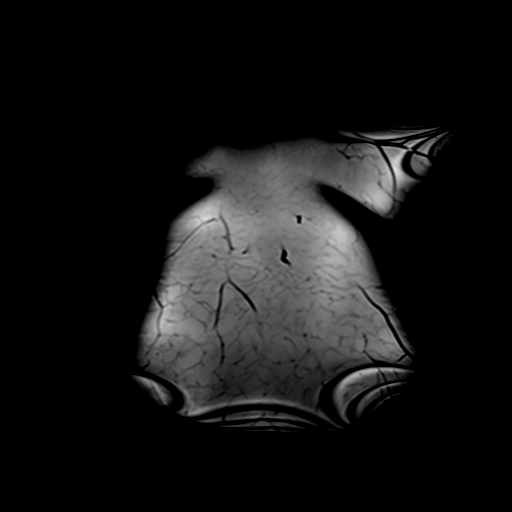
[im 20/40]
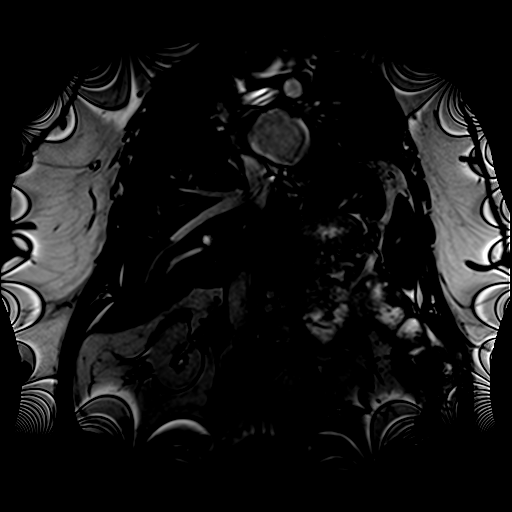
[im 40/40]
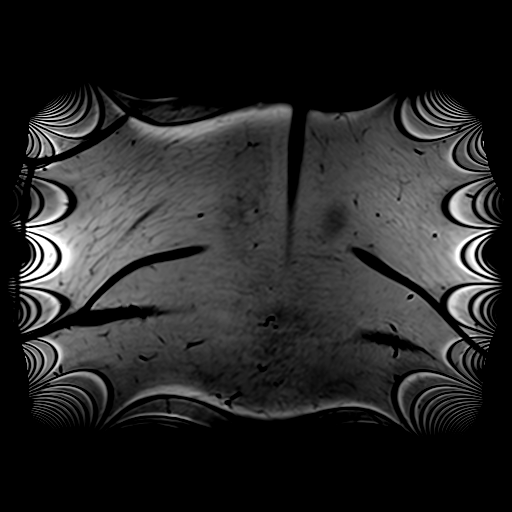

[Series 12: cor obl thk · sagittal · 50.0mm · 1.04mm/px · 1 of 9 slices shown]
[im 1/9]
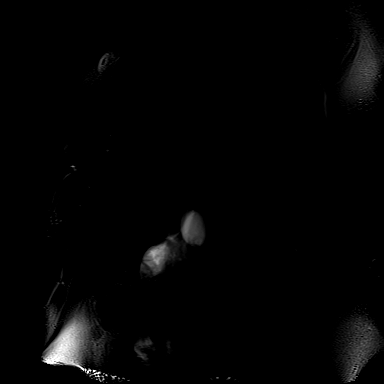

[Series 13: cor_3d_spc_trig-resp · 1 of 10 slices shown]
[im 1/10]
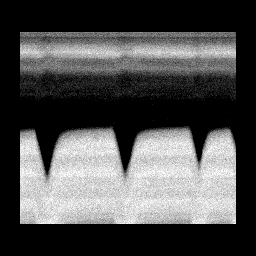

[Series 14: cor_3d_spc_trig · coronal · 2.0mm · 0.65mm/px · 3 of 72 slices shown]
[im 1/72]
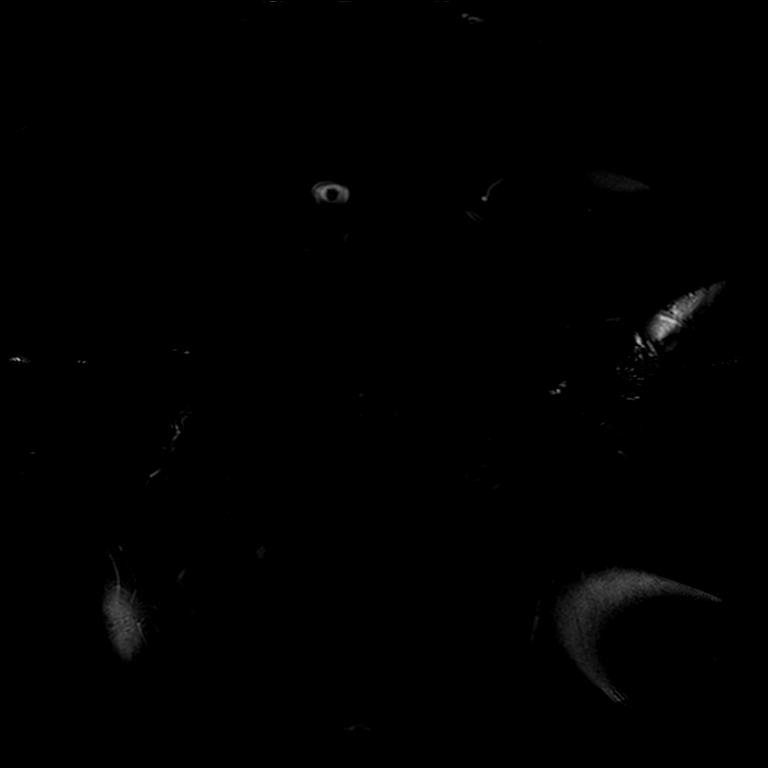
[im 18/72]
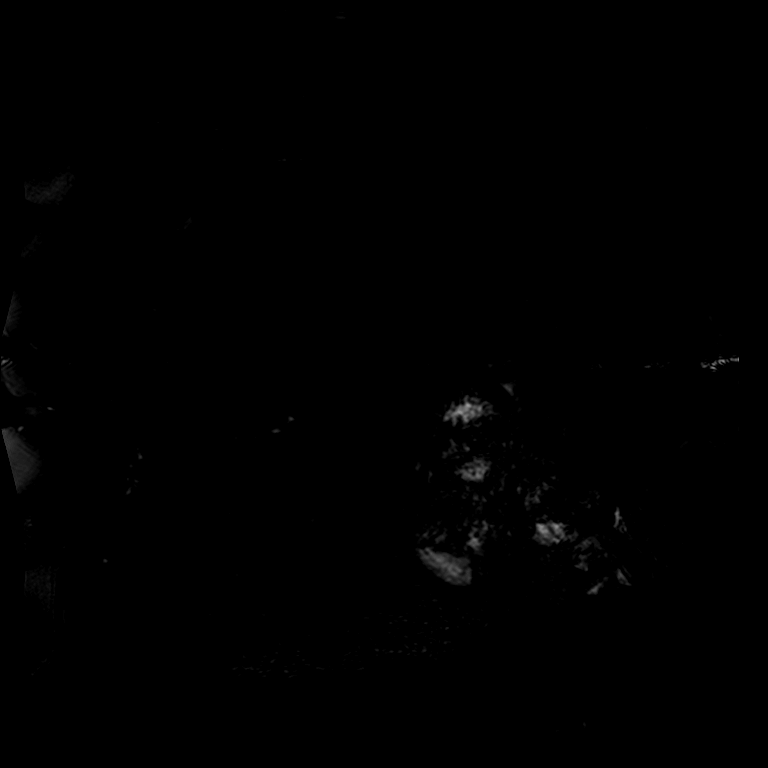
[im 36/72]
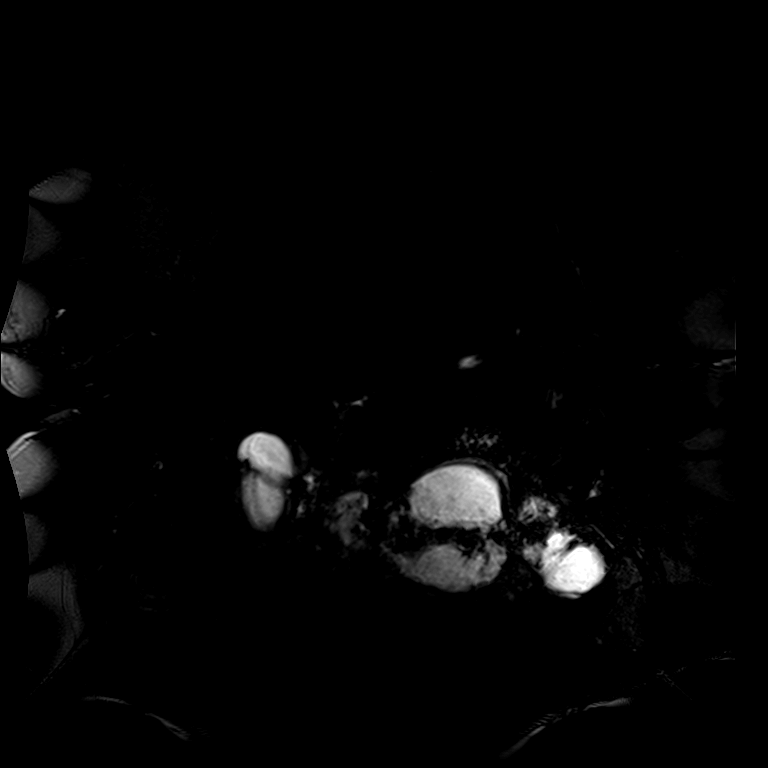

[32 of 48 positions shown; findings below may reference images not displayed]

FINDINGS: Lower chest: Lung bases are clear.

Hepatobiliary: Liver is within normal limits. No focal hepatic
lesions. No morphologic findings of cirrhosis. No hepatic steatosis.

Gallbladder is unremarkable. No intrahepatic or extrahepatic ductal
dilatation. Common duct measures 5 mm. No choledocholithiasis is
seen.

Pancreas: Again noted are peripancreatic fluid/inflammatory changes,
predominantly along the pancreatic tail. Two dominant pancreatic
fluid collection/pseudocysts, including a bilobed collection
measuring 7.9 x 12.6 cm along the pancreatic body/tail (series
16/image 26) and a 4.9 x 6.0 cm collection along the pancreatic tail
adjacent to the splenic flexure of colon (series 16/image 30). These
are grossly unchanged from recent CT, measuring 7.5 x 12.4 cm and
5.3 x 6.1 cm respectively. Additional 2.1 x 2.5 cm fluid collection
in the left anterior abdomen is unchanged. Known parenchymal
calcifications in the pancreatic head/uncinate process are not well
visualized on the current study.

Spleen:  Within normal limits.

Adrenals/Urinary Tract:  Adrenal glands are within normal limits.

Kidneys are within normal limits. Known nonobstructing calculi are
not evident on MR. No hydronephrosis.

Stomach/Bowel: Stomach is within normal limits.

Visualized bowel is grossly unremarkable.

Vascular/Lymphatic:  No evidence of abdominal aortic aneurysm.

No suspicious abdominal lymphadenopathy.

Other:  No abdominal ascites.

Musculoskeletal: No focal osseous lesions.
IMPRESSION: Sequela of prior/chronic pancreatitis, including a dominant 12.6 cm
fluid collection/pseudocyst along the pancreatic body/tail,
unchanged from recent CT. Mild peripancreatic inflammatory
changes/fluid.

No cholelithiasis or choledocholithiasis is evident on MR.

## 2019-08-03 MED ORDER — ACETAMINOPHEN 650 MG RE SUPP: 650.0000 mg | Freq: Four times a day (QID) | RECTAL | Status: DC | PRN

## 2019-08-03 MED ORDER — TRAZODONE HCL 100 MG PO TABS
100.0000 mg | ORAL_TABLET | Freq: Every day | ORAL | Status: DC
  Administered 2019-08-03 – 2019-08-05 (×3): 100 mg via ORAL
  Filled 2019-08-03 (×3): qty 1

## 2019-08-03 MED ORDER — MOMETASONE FURO-FORMOTEROL FUM 200-5 MCG/ACT IN AERO
2.0000 | INHALATION_SPRAY | Freq: Two times a day (BID) | RESPIRATORY_TRACT | Status: DC
  Administered 2019-08-03 – 2019-08-06 (×7): 2 via RESPIRATORY_TRACT
  Filled 2019-08-03: qty 8.8

## 2019-08-03 MED ORDER — FLUCONAZOLE 200 MG PO TABS
200.0000 mg | ORAL_TABLET | Freq: Every day | ORAL | Status: DC
  Administered 2019-08-03 – 2019-08-05 (×3): 200 mg via ORAL
  Filled 2019-08-03 (×4): qty 1

## 2019-08-03 MED ORDER — LISINOPRIL 20 MG PO TABS
20.0000 mg | ORAL_TABLET | Freq: Every day | ORAL | Status: DC
  Administered 2019-08-03 – 2019-08-06 (×4): 20 mg via ORAL
  Filled 2019-08-03 (×4): qty 1

## 2019-08-03 MED ORDER — AMLODIPINE BESYLATE 5 MG PO TABS
5.0000 mg | ORAL_TABLET | Freq: Every day | ORAL | Status: DC
  Administered 2019-08-03 – 2019-08-06 (×4): 5 mg via ORAL
  Filled 2019-08-03 (×4): qty 1

## 2019-08-03 MED ORDER — HYDROMORPHONE HCL 1 MG/ML IJ SOLN
1.0000 mg | Freq: Once | INTRAMUSCULAR | Status: AC
  Administered 2019-08-03: 1 mg via INTRAVENOUS
  Filled 2019-08-03: qty 1

## 2019-08-03 MED ORDER — INSULIN ASPART 100 UNIT/ML ~~LOC~~ SOLN
0.0000 [IU] | SUBCUTANEOUS | Status: DC
  Administered 2019-08-03: 2 [IU] via SUBCUTANEOUS
  Administered 2019-08-03: 1 [IU] via SUBCUTANEOUS
  Administered 2019-08-03: 3 [IU] via SUBCUTANEOUS
  Administered 2019-08-03 – 2019-08-04 (×3): 2 [IU] via SUBCUTANEOUS
  Administered 2019-08-04 (×2): 3 [IU] via SUBCUTANEOUS
  Administered 2019-08-04: 2 [IU] via SUBCUTANEOUS
  Administered 2019-08-04: 7 [IU] via SUBCUTANEOUS
  Administered 2019-08-04 – 2019-08-05 (×2): 3 [IU] via SUBCUTANEOUS
  Administered 2019-08-05: 5 [IU] via SUBCUTANEOUS
  Administered 2019-08-05: 3 [IU] via SUBCUTANEOUS
  Filled 2019-08-03: qty 0.09

## 2019-08-03 MED ORDER — AMITRIPTYLINE HCL 25 MG PO TABS
50.0000 mg | ORAL_TABLET | Freq: Every day | ORAL | Status: DC
  Administered 2019-08-03 – 2019-08-05 (×3): 50 mg via ORAL
  Filled 2019-08-03 (×3): qty 2

## 2019-08-03 MED ORDER — OLANZAPINE 5 MG PO TABS
5.0000 mg | ORAL_TABLET | Freq: Every day | ORAL | Status: DC
  Administered 2019-08-03 – 2019-08-05 (×3): 5 mg via ORAL
  Filled 2019-08-03 (×4): qty 1

## 2019-08-03 MED ORDER — CLONAZEPAM 0.5 MG PO TABS: 0.5000 mg | ORAL_TABLET | Freq: Every day | ORAL | Status: DC | PRN

## 2019-08-03 MED ORDER — ONDANSETRON HCL 4 MG/2ML IJ SOLN
4.0000 mg | Freq: Once | INTRAMUSCULAR | Status: AC
  Administered 2019-08-03: 4 mg via INTRAVENOUS
  Filled 2019-08-03: qty 2

## 2019-08-03 MED ORDER — HYDROMORPHONE HCL 1 MG/ML IJ SOLN
0.5000 mg | INTRAMUSCULAR | Status: DC | PRN
  Administered 2019-08-03 – 2019-08-05 (×6): 0.5 mg via INTRAVENOUS
  Filled 2019-08-03 (×6): qty 0.5

## 2019-08-03 MED ORDER — METOPROLOL TARTRATE 25 MG PO TABS
25.0000 mg | ORAL_TABLET | ORAL | Status: AC
  Administered 2019-08-03: 25 mg via ORAL
  Filled 2019-08-03: qty 1

## 2019-08-03 MED ORDER — ONDANSETRON HCL 4 MG PO TABS
4.0000 mg | ORAL_TABLET | Freq: Four times a day (QID) | ORAL | Status: DC | PRN
  Administered 2019-08-04 (×2): 4 mg via ORAL
  Filled 2019-08-03 (×2): qty 1

## 2019-08-03 MED ORDER — SENNA 8.6 MG PO TABS
1.0000 | ORAL_TABLET | Freq: Two times a day (BID) | ORAL | Status: DC
  Administered 2019-08-03 – 2019-08-06 (×6): 8.6 mg via ORAL
  Filled 2019-08-03 (×6): qty 1

## 2019-08-03 MED ORDER — LURASIDONE HCL 40 MG PO TABS
40.0000 mg | ORAL_TABLET | Freq: Every day | ORAL | Status: DC
  Administered 2019-08-03 – 2019-08-05 (×3): 40 mg via ORAL
  Filled 2019-08-03 (×4): qty 1

## 2019-08-03 MED ORDER — SODIUM CHLORIDE 0.9 % IV SOLN
1.0000 g | Freq: Every day | INTRAVENOUS | Status: DC
  Administered 2019-08-03 – 2019-08-06 (×4): 1 g via INTRAVENOUS
  Filled 2019-08-03: qty 10
  Filled 2019-08-03 (×3): qty 1

## 2019-08-03 MED ORDER — ONDANSETRON HCL 4 MG/2ML IJ SOLN: 4.0000 mg | Freq: Four times a day (QID) | INTRAMUSCULAR | Status: DC | PRN

## 2019-08-03 MED ORDER — PANTOPRAZOLE SODIUM 40 MG IV SOLR
40.0000 mg | Freq: Two times a day (BID) | INTRAVENOUS | Status: DC
  Administered 2019-08-03 – 2019-08-04 (×2): 40 mg via INTRAVENOUS
  Filled 2019-08-03 (×3): qty 40

## 2019-08-03 MED ORDER — LACTATED RINGERS IV BOLUS
1000.0000 mL | Freq: Once | INTRAVENOUS | Status: AC
  Administered 2019-08-03: 1000 mL via INTRAVENOUS

## 2019-08-03 MED ORDER — LACTATED RINGERS IV SOLN: INTRAVENOUS | Status: DC

## 2019-08-03 MED ORDER — SODIUM CHLORIDE 0.9 % IV BOLUS
500.0000 mL | Freq: Once | INTRAVENOUS | Status: AC
  Administered 2019-08-03: 500 mL via INTRAVENOUS

## 2019-08-03 MED ORDER — ACETAMINOPHEN 325 MG PO TABS
650.0000 mg | ORAL_TABLET | Freq: Four times a day (QID) | ORAL | Status: DC | PRN
  Administered 2019-08-05: 650 mg via ORAL
  Filled 2019-08-03: qty 2

## 2019-08-03 MED ORDER — PANTOPRAZOLE SODIUM 40 MG IV SOLR
40.0000 mg | Freq: Every day | INTRAVENOUS | Status: DC
  Filled 2019-08-03: qty 40

## 2019-08-03 MED ORDER — HYDROCHLOROTHIAZIDE 12.5 MG PO CAPS
12.5000 mg | ORAL_CAPSULE | Freq: Every day | ORAL | Status: DC
  Administered 2019-08-03 – 2019-08-06 (×4): 12.5 mg via ORAL
  Filled 2019-08-03 (×4): qty 1

## 2019-08-03 MED ORDER — HYDRALAZINE HCL 20 MG/ML IJ SOLN
10.0000 mg | INTRAMUSCULAR | Status: DC | PRN
  Administered 2019-08-03: 10 mg via INTRAVENOUS
  Filled 2019-08-03: qty 1

## 2019-08-03 MED ORDER — HYDROMORPHONE HCL 1 MG/ML IJ SOLN
0.5000 mg | Freq: Once | INTRAMUSCULAR | Status: AC
  Administered 2019-08-03: 0.5 mg via INTRAVENOUS
  Filled 2019-08-03: qty 1

## 2019-08-03 MED ORDER — LAMOTRIGINE 100 MG PO TABS
200.0000 mg | ORAL_TABLET | Freq: Every day | ORAL | Status: DC
  Administered 2019-08-03 – 2019-08-06 (×4): 200 mg via ORAL
  Filled 2019-08-03 (×4): qty 2

## 2019-08-03 NOTE — Progress Notes (Signed)
   Vital Signs MEWS/VS Documentation      08/03/2019 1500 08/03/2019 1501 08/03/2019 1600 08/03/2019 1800   MEWS Score:  1  1  1  2    MEWS Score Color:  Green  Green  Green  Yellow   Resp:  20  --  --  18   Pulse:  (!) 106  --  --  (!) 124   BP:  (!) 179/122  (!) 162/124  (!) 181/127  (!) 152/110   Temp:  --  --  --  99.1 F (37.3 C)   O2 Device:  Room Air  --  --  Room Air   Level of Consciousness:  --  --  --  Alert      Patient is in YELLOW MEWS due to pulse. Attending physician notified. New orders given.   Dameian Crisman 08/03/2019,6:48 PM

## 2019-08-03 NOTE — ED Notes (Signed)
ED TO INPATIENT HANDOFF REPORT  ED Nurse Name and Phone #: [phone]  S Name/Age/Gender Monique Holland 54 y.o. female Room/Bed: WA15/WA15  Code Status   Code Status: Full Code  Home/SNF/Other Home Patient oriented to: self, place, time and situation Is this baseline? Yes   Triage Complete: Triage complete  Chief Complaint Abdominal pain [R10.9]  Triage Note Arrived by [facility] with c/o diffuse abdominal pain, nausea and vomiting for past 4 hours. Patient reports hx of pancreatitis. Patient states she was here 2 weeks ago with diverticulitis. Patient reports taking 5 mg Oxycodone at 1900    Allergies No Known Allergies  Level of Care/Admitting Diagnosis ED Disposition    ED Disposition Condition Comment   Admit  Hospital Area: [facility] [[id number]]  Level of Care: Telemetry [5]  Admit to tele based on following criteria: Monitor for Ischemic changes  Covid Evaluation: Asymptomatic Screening Protocol (No Symptoms)  Diagnosis: Abdominal pain [124580]  Admitting Physician: Eduard Clos [phone]  Attending Physician: Eduard Clos [phone]  Estimated length of stay: past midnight tomorrow  Certification:: I certify this patient will need inpatient services for at least 2 midnights       B Medical/Surgery History Past Medical History:  Diagnosis Date  . Anxiety   . Asthma   . Depression   . Diabetes mellitus without complication (HCC)   . Frequency of urination   . GERD (gastroesophageal reflux disease)   . History of asthma    last episode yrs ago  . Hypertension   . OSA (obstructive sleep apnea)    pt had study done oct 2014--  schedule for cpap titrate after kidney stone surgery  . Pancreatitis   . Schizophrenia (HCC)   . Ureteral calculi    BILATERAL  . Wears glasses    Past Surgical History:  Procedure Laterality Date  . BIOPSY  07/18/2019   Procedure: BIOPSY;  Surgeon: Shellia Cleverly, DO;  Location: [facility];   Service: Gastroenterology;;  . CESAREAN SECTION  1991  &  2002   w/ bilateral tubal ligation in 2002  . CYSTOSCOPY W/ URETERAL STENT PLACEMENT Right 08/10/2018   Procedure: CYSTOSCOPY WITH RETROGRADE PYELOGRAM/URETERAL STENT PLACEMENT;  Surgeon: Jerilee Field, MD;  Location: [facility];  Service: Urology;  Laterality: Right;  . CYSTOSCOPY WITH RETROGRADE PYELOGRAM, URETEROSCOPY AND STENT PLACEMENT Bilateral 05/02/2013   Procedure: CYSTOSCOPY WITH RETROGRADE PYELOGRAM, URETEROSCOPY ;  Surgeon: Valetta Fuller, MD;  Location[facility];  Service: Urology;  Laterality: Bilateral;  . CYSTOSCOPY WITH RETROGRADE PYELOGRAM, URETEROSCOPY AND STENT PLACEMENT Right 08/15/2018   Procedure: CYSTOSCOPY WITH RIGHT RETROGRADE PYELOGRAM, RIGHT URETEROSCOPY WITH HOLMIUM LASER AND STENT PLACEMENT;  Surgeon: Crista Elliot, MD;  Location: [facility];  Service: Urology;  Laterality: Right;  . CYSTOSCOPY WITH STENT PLACEMENT Left 05/02/2013   Procedure: CYSTOSCOPY WITH STENT PLACEMENT;  Surgeon: Valetta Fuller, MD;  Location: [facility];  Service: Urology;  Laterality: Left;  . ESOPHAGOGASTRODUODENOSCOPY (EGD) WITH PROPOFOL N/A 07/18/2019   Procedure: ESOPHAGOGASTRODUODENOSCOPY (EGD) WITH PROPOFOL;  Surgeon: Shellia Cleverly, DO;  Location: [facility];  Service: Gastroenterology;  Laterality: N/A;  . HOLMIUM LASER APPLICATION Bilateral 05/02/2013   Procedure: HOLMIUM LASER APPLICATION;  Surgeon: Valetta Fuller, MD;  Location: [facility];  Service: Urology;  Laterality: Bilateral;  . LAPAROSCOPIC APPENDECTOMY N/A 05/22/2014   Procedure: APPENDECTOMY LAPAROSCOPIC;  Surgeon: Glenna Fellows, MD;  Location: [facility];  Service: General;  Laterality: N/A;  .  TONSILLECTOMY  age 34     A IV Location/Drains/Wounds Patient Lines/Drains/Airways Status   Active Line/Drains/Airways    Name:   Placement date:   Placement time:   Site:   Days:   Peripheral IV 08/03/19 Right  Forearm   08/03/19    0143    Forearm   less than 1   Ureteral Drain/Stent Right ureter 6 Fr.   08/15/18    0933    Right ureter   353          Intake/Output Last 24 hours No intake or output data in the 24 hours ending 08/03/19 1357  Labs/Imaging Results for orders placed or performed during the hospital encounter of 08/02/19 (from the past 48 hour(s))  Urinalysis, Routine w reflex microscopic     Status: Abnormal   Collection Time: 08/02/19  8:44 PM  Result Value Ref Range   Color, Urine YELLOW YELLOW   APPearance CLOUDY (A) CLEAR   Specific Gravity, Urine 1.011 1.005 - 1.030   pH 6.0 5.0 - 8.0   Glucose, UA NEGATIVE NEGATIVE mg/dL   Hgb urine dipstick SMALL (A) NEGATIVE   Bilirubin Urine NEGATIVE NEGATIVE   Ketones, ur NEGATIVE NEGATIVE mg/dL   Protein, ur 834 (A) NEGATIVE mg/dL   Nitrite NEGATIVE NEGATIVE   Leukocytes,Ua LARGE (A) NEGATIVE   RBC / HPF 11-20 0 - 5 RBC/hpf   WBC, UA >50 (H) 0 - 5 WBC/hpf   Bacteria, UA MANY (A) NONE SEEN   Squamous Epithelial / LPF 0-5 0 - 5   WBC Clumps PRESENT    Mucus PRESENT     Comment: Performed at [facility], [address] [place], [place] [address]  Lipase, blood     Status: None   Collection Time: 08/02/19  8:45 PM  Result Value Ref Range   Lipase 39 11 - 51 U/L    Comment: Performed at [facility], [address], [place], [place] [address]  Comprehensive metabolic panel     Status: Abnormal   Collection Time: 08/02/19  8:45 PM  Result Value Ref Range   Sodium 137 135 - 145 mmol/L   Potassium 3.5 3.5 - 5.1 mmol/L   Chloride 100 98 - 111 mmol/L   CO2 26 22 - 32 mmol/L   Glucose, Bld 253 (H) 70 - 99 mg/dL    Comment: Glucose reference range applies only to samples taken after fasting for at least 8 hours.   BUN 8 6 - 20 mg/dL   Creatinine, Ser 9.21 0.44 - 1.00 mg/dL   Calcium 9.0 8.9 - 19.4 mg/dL   Total Protein 7.2 6.5 - 8.1 g/dL   Albumin 3.7 3.5 - 5.0 g/dL   AST 23 15 - 41 U/L    ALT 15 0 - 44 U/L   Alkaline Phosphatase 76 38 - 126 U/L   Total Bilirubin 0.4 0.3 - 1.2 mg/dL   GFR calc non Af Amer >60 >60 mL/min   GFR calc Af Amer >60 >60 mL/min   Anion gap 11 5 - 15    Comment: Performed at [facility], [address], [place], [place] [address]  CBC     Status: Abnormal   Collection Time: 08/02/19  8:45 PM  Result Value Ref Range   WBC 5.7 4.0 - 10.5 K/uL   RBC 4.50 3.87 - 5.11 MIL/uL   Hemoglobin 10.2 (L) 12.0 - 15.0 g/dL   HCT 14.4 (L) 81.8 - 56.3 %  MCV 78.7 (L) 80.0 - 100.0 fL   MCH 22.7 (L) 26.0 - 34.0 pg   MCHC 28.8 (L) 30.0 - 36.0 g/dL   RDW 66.0 (H) 63.0 - 16.0 %   Platelets 268 150 - 400 K/uL   nRBC 0.0 0.0 - 0.2 %    Comment: Performed at [facility], [address] [place], [place] [address]  CBG monitoring, ED     Status: Abnormal   Collection Time: 08/03/19  5:07 AM  Result Value Ref Range   Glucose-Capillary 198 (H) 70 - 99 mg/dL    Comment: Glucose reference range applies only to samples taken after fasting for at least 8 hours.  Basic metabolic panel     Status: Abnormal   Collection Time: 08/03/19  5:09 AM  Result Value Ref Range   Sodium 138 135 - 145 mmol/L   Potassium 3.7 3.5 - 5.1 mmol/L   Chloride 104 98 - 111 mmol/L   CO2 26 22 - 32 mmol/L   Glucose, Bld 246 (H) 70 - 99 mg/dL    Comment: Glucose reference range applies only to samples taken after fasting for at least 8 hours.   BUN 8 6 - 20 mg/dL   Creatinine, Ser 3.55 0.44 - 1.00 mg/dL   Calcium 8.9 8.9 - 73.2 mg/dL   GFR calc non Af Amer >60 >60 mL/min   GFR calc Af Amer >60 >60 mL/min   Anion gap 8 5 - 15    Comment: Performed at [facility], [address], [place], [place] [address]  CBC     Status: Abnormal   Collection Time: 08/03/19  5:09 AM  Result Value Ref Range   WBC 5.4 4.0 - 10.5 K/uL   RBC 4.18 3.87 - 5.11 MIL/uL   Hemoglobin 9.3 (L) 12.0 - 15.0 g/dL   HCT 27.0 (L) 62.3 - 76.2 %   MCV 79.2  (L) 80.0 - 100.0 fL   MCH 22.2 (L) 26.0 - 34.0 pg   MCHC 28.1 (L) 30.0 - 36.0 g/dL   RDW 83.1 (H) 51.7 - 61.6 %   Platelets 222 150 - 400 K/uL   nRBC 0.0 0.0 - 0.2 %    Comment: Performed at [facility], [address] [place], [place] [address]  Hepatic function panel     Status: Abnormal   Collection Time: 08/03/19  5:09 AM  Result Value Ref Range   Total Protein 6.6 6.5 - 8.1 g/dL   Albumin 3.4 (L) 3.5 - 5.0 g/dL   AST 21 15 - 41 U/L   ALT 13 0 - 44 U/L   Alkaline Phosphatase 66 38 - 126 U/L   Total Bilirubin 0.5 0.3 - 1.2 mg/dL   Bilirubin, Direct 0.1 0.0 - 0.2 mg/dL   Indirect Bilirubin 0.4 0.3 - 0.9 mg/dL    Comment: Performed at [facility], [address], [place], [place] [address]  CBG monitoring, ED     Status: Abnormal   Collection Time: 08/03/19  8:42 AM  Result Value Ref Range   Glucose-Capillary 181 (H) 70 - 99 mg/dL    Comment: Glucose reference range applies only to samples taken after fasting for at least 8 hours.  CBG monitoring, ED     Status: Abnormal   Collection Time: 08/03/19 11:44 AM  Result Value Ref Range   Glucose-Capillary 161 (H) 70 - 99 mg/dL    Comment: Glucose reference range applies only to samples taken  after fasting for at least 8 hours.   MR ABDOMEN MRCP WO CONTRAST  Result Date: 08/03/2019 CLINICAL DATA:  Upper abdominal pain with vomiting, follow-up pancreatic cyst/pseudocyst EXAM: MRI ABDOMEN WITHOUT CONTRAST  (INCLUDING MRCP) TECHNIQUE: Multiplanar multisequence MR imaging of the abdomen was performed. Heavily T2-weighted images of the biliary and pancreatic ducts were obtained, and three-dimensional MRCP images were rendered by post processing. COMPARISON:  CT abdomen/pelvis dated 07/29/2019 FINDINGS: Lower chest: Lung bases are clear. Hepatobiliary: Liver is within normal limits. No focal hepatic lesions. No morphologic findings of cirrhosis. No hepatic steatosis. Gallbladder is unremarkable. No  intrahepatic or extrahepatic ductal dilatation. Common duct measures 5 mm. No choledocholithiasis is seen. Pancreas: Again noted are peripancreatic fluid/inflammatory changes, predominantly along the pancreatic tail. Two dominant pancreatic fluid collection/pseudocysts, including a bilobed collection measuring 7.9 x 12.6 cm along the pancreatic body/tail (series 16/image 26) and a 4.9 x 6.0 cm collection along the pancreatic tail adjacent to the splenic flexure of colon (series 16/image 30). These are grossly unchanged from recent CT, measuring 7.5 x 12.4 cm and 5.3 x 6.1 cm respectively. Additional 2.1 x 2.5 cm fluid collection in the left anterior abdomen is unchanged. Known parenchymal calcifications in the pancreatic head/uncinate process are not well visualized on the current study. Spleen:  Within normal limits. Adrenals/Urinary Tract:  Adrenal glands are within normal limits. Kidneys are within normal limits. Known nonobstructing calculi are not evident on MR. No hydronephrosis. Stomach/Bowel: Stomach is within normal limits. Visualized bowel is grossly unremarkable. Vascular/Lymphatic:  No evidence of abdominal aortic aneurysm. No suspicious abdominal lymphadenopathy. Other:  No abdominal ascites. Musculoskeletal: No focal osseous lesions. IMPRESSION: Sequela of prior/chronic pancreatitis, including a dominant 12.6 cm fluid collection/pseudocyst along the pancreatic body/tail, unchanged from recent CT. Mild peripancreatic inflammatory changes/fluid. No cholelithiasis or choledocholithiasis is evident on MR. Electronically Signed   By: Julian Hy M.D.   On: 08/03/2019 11:52   MR 3D Recon At Scanner  Result Date: 08/03/2019 CLINICAL DATA:  Upper abdominal pain with vomiting, follow-up pancreatic cyst/pseudocyst EXAM: MRI ABDOMEN WITHOUT CONTRAST  (INCLUDING MRCP) TECHNIQUE: Multiplanar multisequence MR imaging of the abdomen was performed. Heavily T2-weighted images of the biliary and pancreatic  ducts were obtained, and three-dimensional MRCP images were rendered by post processing. COMPARISON:  CT abdomen/pelvis dated 07/29/2019 FINDINGS: Lower chest: Lung bases are clear. Hepatobiliary: Liver is within normal limits. No focal hepatic lesions. No morphologic findings of cirrhosis. No hepatic steatosis. Gallbladder is unremarkable. No intrahepatic or extrahepatic ductal dilatation. Common duct measures 5 mm. No choledocholithiasis is seen. Pancreas: Again noted are peripancreatic fluid/inflammatory changes, predominantly along the pancreatic tail. Two dominant pancreatic fluid collection/pseudocysts, including a bilobed collection measuring 7.9 x 12.6 cm along the pancreatic body/tail (series 16/image 26) and a 4.9 x 6.0 cm collection along the pancreatic tail adjacent to the splenic flexure of colon (series 16/image 30). These are grossly unchanged from recent CT, measuring 7.5 x 12.4 cm and 5.3 x 6.1 cm respectively. Additional 2.1 x 2.5 cm fluid collection in the left anterior abdomen is unchanged. Known parenchymal calcifications in the pancreatic head/uncinate process are not well visualized on the current study. Spleen:  Within normal limits. Adrenals/Urinary Tract:  Adrenal glands are within normal limits. Kidneys are within normal limits. Known nonobstructing calculi are not evident on MR. No hydronephrosis. Stomach/Bowel: Stomach is within normal limits. Visualized bowel is grossly unremarkable. Vascular/Lymphatic:  No evidence of abdominal aortic aneurysm. No suspicious abdominal lymphadenopathy. Other:  No abdominal ascites. Musculoskeletal: No focal osseous  lesions. IMPRESSION: Sequela of prior/chronic pancreatitis, including a dominant 12.6 cm fluid collection/pseudocyst along the pancreatic body/tail, unchanged from recent CT. Mild peripancreatic inflammatory changes/fluid. No cholelithiasis or choledocholithiasis is evident on MR. Electronically Signed   By: Charline BillsSriyesh  Krishnan M.D.   On:  08/03/2019 11:52    Pending Labs Unresulted Labs (From admission, onward)    Start     Ordered   08/04/19 0500  CBC  Tomorrow morning,   R     08/03/19 1143   08/04/19 0500  Comprehensive metabolic panel  Tomorrow morning,   R     08/03/19 1143   08/04/19 0500  Magnesium  Tomorrow morning,   R     08/03/19 1143   08/03/19 0435  SARS CORONAVIRUS 2 (TAT 6-24 HRS) Nasopharyngeal Nasopharyngeal Swab  (Tier 3 (TAT 6-24 hrs))  ONCE - STAT,   STAT    Question Answer Comment  Is this test for diagnosis or screening Screening   Symptomatic for COVID-19 as defined by CDC No   Hospitalized for COVID-19 No   Admitted to ICU for COVID-19 No   Previously tested for COVID-19 Yes   Resident in a congregate (group) care setting No   Employed in healthcare setting No   Pregnant No      08/03/19 0434   08/03/19 0421  Culture, Urine  Once,   STAT     08/03/19 0420          Vitals/Pain Today's Vitals   08/03/19 0900 08/03/19 1007 08/03/19 1008 08/03/19 1223  BP: (!) 154/105 (!) 145/99  (!) 184/138  Pulse: (!) 103 (!) 106  (!) 107  Resp: 13 11  20   Temp:      TempSrc:      SpO2: 91% 97%  98%  PainSc:   4      Isolation Precautions No active isolations  Medications Medications  OLANZapine (ZYPREXA) tablet 5 mg (has no administration in time range)  lamoTRIgine (LAMICTAL) tablet 200 mg (200 mg Oral Given 08/03/19 1007)  clonazePAM (KLONOPIN) tablet 0.5 mg (has no administration in time range)  traZODone (DESYREL) tablet 100 mg (has no administration in time range)  mometasone-formoterol (DULERA) 200-5 MCG/ACT inhaler 2 puff (2 puffs Inhalation Given 08/03/19 0736)  acetaminophen (TYLENOL) tablet 650 mg (has no administration in time range)    Or  acetaminophen (TYLENOL) suppository 650 mg (has no administration in time range)  ondansetron (ZOFRAN) tablet 4 mg (has no administration in time range)    Or  ondansetron (ZOFRAN) injection 4 mg (has no administration in time range)   insulin aspart (novoLOG) injection 0-9 Units (2 Units Subcutaneous Given 08/03/19 1235)  lactated ringers infusion ( Intravenous New Bag/Given 08/03/19 0525)  cefTRIAXone (ROCEPHIN) 1 g in sodium chloride 0.9 % 100 mL IVPB (1 g Intravenous New Bag/Given 08/03/19 0527)  hydrALAZINE (APRESOLINE) injection 10 mg (has no administration in time range)  pantoprazole (PROTONIX) injection 40 mg (has no administration in time range)  HYDROmorphone (DILAUDID) injection 0.5 mg (has no administration in time range)  HYDROmorphone (DILAUDID) injection 1 mg (1 mg Intravenous Given 08/03/19 0145)  ondansetron (ZOFRAN) injection 4 mg (4 mg Intravenous Given 08/03/19 0144)  sodium chloride 0.9 % bolus 500 mL (0 mLs Intravenous Stopped 08/03/19 0302)  lactated ringers bolus 1,000 mL (0 mLs Intravenous Stopped 08/03/19 0529)  HYDROmorphone (DILAUDID) injection 0.5 mg (0.5 mg Intravenous Given 08/03/19 0920)    Mobility walks     Focused Assessments GI   R Recommendations:  See Admitting Provider Note  Report given to:   Additional Notes: n/a

## 2019-08-03 NOTE — ED Notes (Signed)
Patient transported to MRI 

## 2019-08-03 NOTE — Consult Note (Addendum)
Referring Provider: Dr. Rodena Piety, [facility] Primary Care Physician:  Kristie Cowman, MD Primary Gastroenterologist:  None/unassigned  Reason for Consultation:  N/V/epigastric pain  HPI: Monique Holland is a 54 y.o. female with history of bipolar disorder, schizophrenia, diabetes, hypertension and morbid obesity.  She has just returned home from prolonged hospitalization in [place] at [facility], 06/18/2019 through 07/10/2019 she was admitted with severe acute necrotizing pancreatitis, and nonocclusive splenic vein thrombosis and also found to have hypercalcemia felt secondary to hyperparathyroidism.  She underwent left lower lobe parathyroidectomy on 07/04/2019 while hospitalized. She was discharged with nocturnal tube feeds via nasoenteric tube.  She then she presented to [facility] on February 24, discharged on February 28, with complaints of hematemesis.  She was seen in consult by her GI service and underwent EGD by Dr. Bryan Lemma on February 25 that showed the following:  - Esophageal plaques were found, suspicious for candidiasis. Biopsied. - Z-line mildly irregular consistent with mild inflammation but no bleeding. Suspect some degree of inflammation from NJT irritation, reflux, and recent nausea/vomiting. - Gastritis. Biopsied. - Normal gastric fundus, gastric body and incisura. Biopsied. - Normal ampulla, duodenal bulb, first portion of the duodenum and second portion of the duodenum.  She keeps presenting back to the emergency department with complaints of nausea, vomiting, and epigastric abdominal pain.  She was seen on March 8 and had a CT scan of the abdomen pelvis with contrast.  Here again today and underwent MR I of the abdomen/MRCP.  All of these imaging studies show stable findings as compared to previous studies with sequelae of chronic/prior pancreatitis including a dominant 12.6 cm fluid collection along the pancreatic body/tail and mild peripancreatic  inflammatory changes.  Lipase and all of her other labs are unremarkable.  When she was seen in the emergency department she looked well.  Sitting in bed on the phone.  Asked if she could have a cheeseburger and [place] fries.  Said that she has been eating cheeseburger and [place] fries at home and they do not seem to bother her.  Says that some days she does not eat anything and will have vomiting of acid material.   Past Medical History:  Diagnosis Date  . Anxiety   . Asthma   . Depression   . Diabetes mellitus without complication ([place])   . Frequency of urination   . GERD (gastroesophageal reflux disease)   . History of asthma    last episode yrs ago  . Hypertension   . OSA (obstructive sleep apnea)    pt had study done oct 2014--  schedule for cpap titrate after kidney stone surgery  . Pancreatitis   . Schizophrenia ([place])   . Ureteral calculi    BILATERAL  . Wears glasses     Past Surgical History:  Procedure Laterality Date  . BIOPSY  07/18/2019   Procedure: BIOPSY;  Surgeon: Lavena Bullion, DO;  Location: [facility];  Service: Gastroenterology;;  . [place]  &  2002   w/ bilateral tubal ligation in 2002  . CYSTOSCOPY W/ URETERAL STENT PLACEMENT Right 08/10/2018   Procedure: CYSTOSCOPY WITH RETROGRADE PYELOGRAM/URETERAL STENT PLACEMENT;  Surgeon: Festus Aloe, MD;  Location: [facility];  Service: Urology;  Laterality: Right;  . CYSTOSCOPY WITH RETROGRADE PYELOGRAM, URETEROSCOPY AND STENT PLACEMENT Bilateral 05/02/2013   Procedure: CYSTOSCOPY WITH RETROGRADE PYELOGRAM, URETEROSCOPY ;  Surgeon: Bernestine Amass, MD;  Location: [facility];  Service: Urology;  Laterality: Bilateral;  . CYSTOSCOPY  WITH RETROGRADE PYELOGRAM, URETEROSCOPY AND STENT PLACEMENT Right 08/15/2018   Procedure: CYSTOSCOPY WITH RIGHT RETROGRADE PYELOGRAM, RIGHT URETEROSCOPY WITH HOLMIUM LASER AND STENT PLACEMENT;  Surgeon: Crista ElliotBell, Eugene D III, MD;  Location: [facility];  Service:  Urology;  Laterality: Right;  . CYSTOSCOPY WITH STENT PLACEMENT Left 05/02/2013   Procedure: CYSTOSCOPY WITH STENT PLACEMENT;  Surgeon: Valetta Fulleravid S Grapey, MD;  [facility];  Service: Urology;  Laterality: Left;  . ESOPHAGOGASTRODUODENOSCOPY (EGD) WITH PROPOFOL N/A 07/18/2019   Procedure: ESOPHAGOGASTRODUODENOSCOPY (EGD) WITH PROPOFOL;  Surgeon: Shellia Cleverlyirigliano, Vito V, DO;  Location: [facility];  Service: Gastroenterology;  Laterality: N/A;  . HOLMIUM LASER APPLICATION Bilateral 05/02/2013   Procedure: HOLMIUM LASER APPLICATION;  Surgeon: Valetta Fulleravid S Grapey, MD;  [facility] ;  Service: Urology;  Laterality: Bilateral;  . LAPAROSCOPIC APPENDECTOMY N/A 05/22/2014   Procedure: APPENDECTOMY LAPAROSCOPIC;  Surgeon: Glenna FellowsBenjamin Hoxworth, MD;  Location: [facility];  Service: General;  Laterality: N/A;  . TONSILLECTOMY  age 54    Prior to Admission medications   Medication Sig Start Date End Date Taking? Authorizing Provider  acetaminophen (TYLENOL) 325 MG tablet Take 2 tablets (650 mg total) by mouth every 6 (six) hours as needed for mild pain (or Fever >/= 101). 07/21/19  Yes Regalado, Belkys A, MD  albuterol (VENTOLIN HFA) 108 (90 Base) MCG/ACT inhaler Inhale 1-2 puffs into the lungs every 6 (six) hours as needed for wheezing or shortness of breath. 07/21/19  Yes Regalado, Belkys A, MD  amitriptyline (ELAVIL) 50 MG tablet Take 50 mg by mouth at bedtime. 07/22/19  Yes [provider]  amLODipine (NORVASC) 5 MG tablet Take 5 mg by mouth daily. 07/26/19  Yes [provider]  clonazePAM (KLONOPIN) 0.5 MG tablet Take 0.5 mg by mouth daily as needed for anxiety. 05/23/19  Yes [provider]  fluconazole (DIFLUCAN) 200 MG tablet Take 1 tablet (200 mg total) by mouth daily for 20 days. 07/21/19 08/10/19 Yes Regalado, Belkys A, MD  lamoTRIgine (LAMICTAL) 200 MG tablet Take 200 mg by mouth daily. 06/06/19  Yes [provider]  LANTUS SOLOSTAR 100  UNIT/ML Solostar Pen Inject 15 Units into the skin at bedtime. 07/21/19  Yes Regalado, Belkys A, MD  lisinopril-hydrochlorothiazide (ZESTORETIC) 20-12.5 MG tablet Take 1 tablet by mouth 2 (two) times daily. 07/31/19  Yes [provider]  lurasidone (LATUDA) 40 MG TABS tablet Take 1 tablet (40 mg total) by mouth at bedtime. 07/21/19  Yes Regalado, Belkys A, MD  OLANZapine (ZYPREXA) 5 MG tablet Take 5 mg by mouth at bedtime. 06/01/19  Yes [provider]  ondansetron (ZOFRAN) 8 MG tablet Take 8 mg by mouth every 8 (eight) hours as needed for nausea or vomiting.  07/26/19  Yes [provider]  oxyCODONE (ROXICODONE) 5 MG immediate release tablet Take 1 tablet (5 mg total) by mouth every 4 (four) hours as needed for severe pain. 07/29/19  Yes Pricilla LovelessGoldston, Scott, MD  senna (SENOKOT) 8.6 MG TABS tablet Take 1 tablet (8.6 mg total) by mouth 2 (two) times daily. 07/21/19  Yes Regalado, Belkys A, MD  SYMBICORT 160-4.5 MCG/ACT inhaler Inhale 2 puffs into the lungs 2 (two) times daily as needed (SOB/wheezing).  04/26/19  Yes [provider]  traZODone (DESYREL) 100 MG tablet Take 100 mg by mouth at bedtime.   Yes [provider]  TRULICITY 4.5 MG/0.5ML SOPN Inject 4.5 mg into the skin once a week. Take on Mondays 07/29/19  Yes [provider]  ondansetron [facility](ZOFRAN  ODT) 4 MG disintegrating tablet Take 1 tablet (4 mg total) by mouth every 8 (eight) hours as needed. Patient not taking: Reported on 08/03/2019 07/29/19   Pricilla Loveless, MD  pantoprazole (PROTONIX) 40 MG tablet Take 1 tablet (40 mg total) by mouth 2 (two) times daily. Patient not taking: Reported on 08/03/2019 07/21/19   Alba Cory, MD    Current Facility-Administered Medications  Medication Dose Route Frequency Provider Last Rate Last Admin  . acetaminophen (TYLENOL) tablet 650 mg  650 mg Oral Q6H PRN Eduard Clos, MD       Or  . acetaminophen (TYLENOL) suppository 650 mg  650 mg Rectal Q6H PRN  Eduard Clos, MD      . cefTRIAXone (ROCEPHIN) 1 g in sodium chloride 0.9 % 100 mL IVPB  1 g Intravenous Q0600 Eduard Clos, MD 200 mL/hr at 08/03/19 0527 1 g at 08/03/19 0527  . clonazePAM (KLONOPIN) tablet 0.5 mg  0.5 mg Oral Daily PRN Eduard Clos, MD      . hydrALAZINE (APRESOLINE) injection 10 mg  10 mg Intravenous Q4H PRN Eduard Clos, MD      . HYDROmorphone (DILAUDID) injection 0.5 mg  0.5 mg Intravenous Q4H PRN Alwyn Ren, MD      . insulin aspart (novoLOG) injection 0-9 Units  0-9 Units Subcutaneous Q4H Eduard Clos, MD   2 Units at 08/03/19 0919  . lactated ringers infusion   Intravenous Continuous Eduard Clos, MD 125 mL/hr at 08/03/19 0525 New Bag at 08/03/19 0525  . lamoTRIgine (LAMICTAL) tablet 200 mg  200 mg Oral Daily Eduard Clos, MD   200 mg at 08/03/19 1007  . mometasone-formoterol (DULERA) 200-5 MCG/ACT inhaler 2 puff  2 puff Inhalation BID Eduard Clos, MD   2 puff at 08/03/19 0736  . OLANZapine (ZYPREXA) tablet 5 mg  5 mg Oral QHS Eduard Clos, MD      . ondansetron [facility]) tablet 4 mg  4 mg Oral Q6H PRN Eduard Clos, MD       Or  . ondansetron [facility]) injection 4 mg  4 mg Intravenous Q6H PRN Eduard Clos, MD      . pantoprazole (PROTONIX) injection 40 mg  40 mg Intravenous Q12H Alwyn Ren, MD      . traZODone (DESYREL) tablet 100 mg  100 mg Oral QHS Eduard Clos, MD       Current Outpatient Medications  Medication Sig Dispense Refill  . acetaminophen (TYLENOL) 325 MG tablet Take 2 tablets (650 mg total) by mouth every 6 (six) hours as needed for mild pain (or Fever >/= 101). 30 tablet 0  . albuterol (VENTOLIN HFA) 108 (90 Base) MCG/ACT inhaler Inhale 1-2 puffs into the lungs every 6 (six) hours as needed for wheezing or shortness of breath. 6.7 g 0  . amitriptyline (ELAVIL) 50 MG tablet Take 50 mg by mouth at bedtime.    Marland Kitchen amLODipine (NORVASC) 5 MG tablet  Take 5 mg by mouth daily.    . clonazePAM (KLONOPIN) 0.5 MG tablet Take 0.5 mg by mouth daily as needed for anxiety.    . fluconazole (DIFLUCAN) 200 MG tablet Take 1 tablet (200 mg total) by mouth daily for 20 days. 20 tablet 0  . lamoTRIgine (LAMICTAL) 200 MG tablet Take 200 mg by mouth daily.    Marland Kitchen LANTUS SOLOSTAR 100 UNIT/ML Solostar Pen Inject 15 Units into the skin at bedtime. 15 mL 11  .  lisinopril-hydrochlorothiazide (ZESTORETIC) 20-12.5 MG tablet Take 1 tablet by mouth 2 (two) times daily.    Marland Kitchen lurasidone (LATUDA) 40 MG TABS tablet Take 1 tablet (40 mg total) by mouth at bedtime. 30 tablet 2  . OLANZapine (ZYPREXA) 5 MG tablet Take 5 mg by mouth at bedtime.    . ondansetron (ZOFRAN) 8 MG tablet Take 8 mg by mouth every 8 (eight) hours as needed for nausea or vomiting.     Marland Kitchen oxyCODONE (ROXICODONE) 5 MG immediate release tablet Take 1 tablet (5 mg total) by mouth every 4 (four) hours as needed for severe pain. 15 tablet 0  . senna (SENOKOT) 8.6 MG TABS tablet Take 1 tablet (8.6 mg total) by mouth 2 (two) times daily. 120 tablet 0  . SYMBICORT 160-4.5 MCG/ACT inhaler Inhale 2 puffs into the lungs 2 (two) times daily as needed (SOB/wheezing).     . traZODone (DESYREL) 100 MG tablet Take 100 mg by mouth at bedtime.    . TRULICITY 4.5 MG/0.5ML SOPN Inject 4.5 mg into the skin once a week. Take on Mondays    . ondansetron (ZOFRAN ODT) 4 MG disintegrating tablet Take 1 tablet (4 mg total) by mouth every 8 (eight) hours as needed. (Patient not taking: Reported on 08/03/2019) 10 tablet 0  . pantoprazole (PROTONIX) 40 MG tablet Take 1 tablet (40 mg total) by mouth 2 (two) times daily. (Patient not taking: Reported on 08/03/2019) 60 tablet 0    Allergies as of 08/02/2019  . (No Known Allergies)    Family History  Family history unknown: Yes    Social History   Socioeconomic History  . Marital status: Married    Spouse name: Not on file  . Number of children: Not on file  . Years of  education: Not on file  . Highest education level: Not on file  Occupational History  . Not on file  Tobacco Use  . Smoking status: Never Smoker  . Smokeless tobacco: Never Used  Substance and Sexual Activity  . Alcohol use: No  . Drug use: No  . Sexual activity: Not on file  Other Topics Concern  . Not on file  Social History Narrative  . Not on file   Social Determinants of Health   Financial Resource Strain:   . Difficulty of Paying Living Expenses:   Food Insecurity:   . Worried About Programme researcher, broadcasting/film/video in the Last Year:   . Barista in the Last Year:   Transportation Needs:   . Freight forwarder (Medical):   Marland Kitchen Lack of Transportation (Non-Medical):   Physical Activity:   . Days of Exercise per Week:   . Minutes of Exercise per Session:   Stress:   . Feeling of Stress :   Social Connections:   . Frequency of Communication with Friends and Family:   . Frequency of Social Gatherings with Friends and Family:   . Attends Religious Services:   . Active Member of Clubs or Organizations:   . Attends Banker Meetings:   Marland Kitchen Marital Status:   Intimate Partner Violence:   . Fear of Current or Ex-Partner:   . Emotionally Abused:   Marland Kitchen Physically Abused:   . Sexually Abused:     Review of Systems: ROS is O/W negative except as mentioned in HPI.  Physical Exam: Vital signs in last 24 hours: Temp:  [98.1 F (36.7 C)-99.5 F (37.5 C)] 98.1 F (36.7 C) (03/13 0528) Pulse Rate:  [97-120]  106 (03/13 1007) Resp:  [11-20] 11 (03/13 1007) BP: (145-193)/(83-123) 145/99 (03/13 1007) SpO2:  [89 %-100 %] 97 % (03/13 1007)   General:  Alert, Well-developed, well-nourished, pleasant and cooperative in NAD Head:  Normocephalic and atraumatic. Eyes:  Sclera clear, no icterus.  Conjunctiva pink. Ears:  Normal auditory acuity. Mouth:  No deformity or lesions.   Neck:  Supple; no masses or thyromegaly. Lungs:  Clear throughout to auscultation.  No wheezes,  crackles, or rhonchi.  Heart:  Regular rate and rhythm; no murmurs, clicks, rubs, or gallops. Abdomen:  Soft, obese, non-distended.  BS present.  Epigastric TTP. Msk:  Symmetrical without gross deformities. Pulses:  Normal pulses noted. Extremities:  Without clubbing or edema. Neurologic:  Alert and oriented x 4;  grossly normal neurologically. Skin:  Intact without significant lesions or rashes. Psych:  Alert and cooperative. Normal mood and affect.  Lab Results: Recent Labs    08/02/19 2045 08/03/19 0509  WBC 5.7 5.4  HGB 10.2* 9.3*  HCT 35.4* 33.1*  PLT 268 222   BMET Recent Labs    08/02/19 2045 08/03/19 0509  NA 137 138  K 3.5 3.7  CL 100 104  CO2 26 26  GLUCOSE 253* 246*  BUN 8 8  CREATININE 0.82 0.72  CALCIUM 9.0 8.9   LFT Recent Labs    08/03/19 0509  PROT 6.6  ALBUMIN 3.4*  AST 21  ALT 13  ALKPHOS 66  BILITOT 0.5  BILIDIR 0.1  IBILI 0.4   Studies/Results: MR ABDOMEN MRCP WO CONTRAST  Result Date: 08/03/2019 CLINICAL DATA:  Upper abdominal pain with vomiting, follow-up pancreatic cyst/pseudocyst EXAM: MRI ABDOMEN WITHOUT CONTRAST  (INCLUDING MRCP) TECHNIQUE: Multiplanar multisequence MR imaging of the abdomen was performed. Heavily T2-weighted images of the biliary and pancreatic ducts were obtained, and three-dimensional MRCP images were rendered by post processing. COMPARISON:  CT abdomen/pelvis dated 07/29/2019 FINDINGS: Lower chest: Lung bases are clear. Hepatobiliary: Liver is within normal limits. No focal hepatic lesions. No morphologic findings of cirrhosis. No hepatic steatosis. Gallbladder is unremarkable. No intrahepatic or extrahepatic ductal dilatation. Common duct measures 5 mm. No choledocholithiasis is seen. Pancreas: Again noted are peripancreatic fluid/inflammatory changes, predominantly along the pancreatic tail. Two dominant pancreatic fluid collection/pseudocysts, including a bilobed collection measuring 7.9 x 12.6 cm along the pancreatic  body/tail (series 16/image 26) and a 4.9 x 6.0 cm collection along the pancreatic tail adjacent to the splenic flexure of colon (series 16/image 30). These are grossly unchanged from recent CT, measuring 7.5 x 12.4 cm and 5.3 x 6.1 cm respectively. Additional 2.1 x 2.5 cm fluid collection in the left anterior abdomen is unchanged. Known parenchymal calcifications in the pancreatic head/uncinate process are not well visualized on the current study. Spleen:  Within normal limits. Adrenals/Urinary Tract:  Adrenal glands are within normal limits. Kidneys are within normal limits. Known nonobstructing calculi are not evident on MR. No hydronephrosis. Stomach/Bowel: Stomach is within normal limits. Visualized bowel is grossly unremarkable. Vascular/Lymphatic:  No evidence of abdominal aortic aneurysm. No suspicious abdominal lymphadenopathy. Other:  No abdominal ascites. Musculoskeletal: No focal osseous lesions. IMPRESSION: Sequela of prior/chronic pancreatitis, including a dominant 12.6 cm fluid collection/pseudocyst along the pancreatic body/tail, unchanged from recent CT. Mild peripancreatic inflammatory changes/fluid. No cholelithiasis or choledocholithiasis is evident on MR. Electronically Signed   By: Charline Bills M.D.   On: 08/03/2019 11:52   MR 3D Recon At Scanner  Result Date: 08/03/2019 CLINICAL DATA:  Upper abdominal pain with vomiting, follow-up pancreatic cyst/pseudocyst  EXAM: MRI ABDOMEN WITHOUT CONTRAST  (INCLUDING MRCP) TECHNIQUE: Multiplanar multisequence MR imaging of the abdomen was performed. Heavily T2-weighted images of the biliary and pancreatic ducts were obtained, and three-dimensional MRCP images were rendered by post processing. COMPARISON:  CT abdomen/pelvis dated 07/29/2019 FINDINGS: Lower chest: Lung bases are clear. Hepatobiliary: Liver is within normal limits. No focal hepatic lesions. No morphologic findings of cirrhosis. No hepatic steatosis. Gallbladder is unremarkable. No  intrahepatic or extrahepatic ductal dilatation. Common duct measures 5 mm. No choledocholithiasis is seen. Pancreas: Again noted are peripancreatic fluid/inflammatory changes, predominantly along the pancreatic tail. Two dominant pancreatic fluid collection/pseudocysts, including a bilobed collection measuring 7.9 x 12.6 cm along the pancreatic body/tail (series 16/image 26) and a 4.9 x 6.0 cm collection along the pancreatic tail adjacent to the splenic flexure of colon (series 16/image 30). These are grossly unchanged from recent CT, measuring 7.5 x 12.4 cm and 5.3 x 6.1 cm respectively. Additional 2.1 x 2.5 cm fluid collection in the left anterior abdomen is unchanged. Known parenchymal calcifications in the pancreatic head/uncinate process are not well visualized on the current study. Spleen:  Within normal limits. Adrenals/Urinary Tract:  Adrenal glands are within normal limits. Kidneys are within normal limits. Known nonobstructing calculi are not evident on MR. No hydronephrosis. Stomach/Bowel: Stomach is within normal limits. Visualized bowel is grossly unremarkable. Vascular/Lymphatic:  No evidence of abdominal aortic aneurysm. No suspicious abdominal lymphadenopathy. Other:  No abdominal ascites. Musculoskeletal: No focal osseous lesions. IMPRESSION: Sequela of prior/chronic pancreatitis, including a dominant 12.6 cm fluid collection/pseudocyst along the pancreatic body/tail, unchanged from recent CT. Mild peripancreatic inflammatory changes/fluid. No cholelithiasis or choledocholithiasis is evident on MR. Electronically Signed   By: Charline Bills M.D.   On: 08/03/2019 11:52   IMPRESSION:  #35 54 year old female, diabetic with morbid obesity with recent prolonged hospitalization at [facility] with acute severe necrotizing pancreatitis.  She was discharged home in February.  Etiology of pancreatitis possibly secondary to hypercalcemia versus medication induced.  All recent imaging studies are stable.  Labs  unremarkable. #2 new diagnosis of hypercalcemia and hyperparathyroidism, status post left lower lobe parathyroidectomy 07/04/2019. #3 insulin-dependent diabetes mellitus-poor control #4 persistent nausea and vomiting and epigastric abdomina pain since discharge from [facility]-had EGD for hematemesis on 2/25 with only gastritis and Candida esophagitis seen.  PLAN: *Advised that all of her recent imaging studies are stable and unchanged.  Labs unremarkable.  She does have peripancreatic fluid collections, but they do not seem to be pressing on her stomach, etc.  She was advised that it would take weeks to months for all of this to improve/resolve. *She looks well.  Is asking for cheeseburger and [place] fries.  Advised that she needs to follow a low-fat diet and that eating these types of foods may be in fact what keeps triggering her symptoms and bringing her back to the emergency department.  I told her that I would place her on a low-fat diet.  If she tolerates that then may be discharged later today.   Princella Pellegrini. Zehr  08/03/2019, 12:17 PM    Attending physician's note   I have taken a history, examined the patient and reviewed the chart. I agree with the Advanced Practitioner's note, impression and recommendations.   Recent hospitalization with necrotizing pancreatitis and SVT Stable CT abd & pelvis with findings of chronic pancreatitis and sequela of recent acute necrotizing pancreatitis. 12.6cm pseudocyst.  Reviewed recent EGD 07/18/19: no high risk lesions other than esophagitis and gastritis  Labs unremarkable, stable  compared to prior  No evidence of infections of pseudocyst, no indication for drainage  Continue supportive care.  Low fat diet Pain control  GI available if needed, please call with any questions  K. Scherry Ran , MD [phone]

## 2019-08-03 NOTE — Progress Notes (Signed)
This patient is admitted this morning.  54 year old with history of necrotizing pancreatitis Candida esophagitis obesity type 2 diabetes aminal pain nausea and vomiting.  The pain is more in her epigastrium. CT scan done on 07/29/2019 shows peripancreatic fluid collections with a normal lipase. Patient continues to have abdominal pain nausea and vomiting. Increase Protonix to twice a day As needed Zofran/Reglan Scopolamine patch Clear liquid diet Follow-up MRI Fluids Continue Rocephin for UTI cultures Discussed with Shanda Bumps with [facility] -if no improvement with consult GI officially.

## 2019-08-03 NOTE — ED Provider Notes (Signed)
[facility]-EMERGENCY DEPT Provider Note   CSN: [id number] Arrival date & time: 08/02/19  2028     History Chief Complaint  Patient presents with  . Abdominal Pain  . Emesis    Monique Holland is a 54 y.o. female with a hx of pancreatitis, diabetes, schizophrenia, obesity presents to the Emergency Department complaining of gradual, persistent, progressively worsening epigastric abd pain onset 4:30pm this afternoon.  Pt reports intractable vomiting that was NBNB.  Pt reports hx of pancreatitis and pain is similar.  She reports she was seen earlier in the week for similar and pain has waxed and waned but has not resolved. She reports she was vomiting at that time as well.  She reports she has been taking oxycodone at home without relief.  She reports she has not followed-up wit GI. Pt reports eating and drinking makes her pain worse.  Nothing makes it better.  She denies fever, chills, headache, neck pain, chest pain, weakness, dizziness, syncope.    The history is provided by the patient and medical records. No language interpreter was used.       Past Medical History:  Diagnosis Date  . Anxiety   . Asthma   . Depression   . Diabetes mellitus without complication (HCC)   . Frequency of urination   . GERD (gastroesophageal reflux disease)   . History of asthma    last episode yrs ago  . Hypertension   . OSA (obstructive sleep apnea)    pt had study done oct 2014--  schedule for cpap titrate after kidney stone surgery  . Pancreatitis   . Schizophrenia (HCC)   . Ureteral calculi    BILATERAL  . Wears glasses     Patient Active Problem List   Diagnosis Date Noted  . Pancreatic pseudocyst   . Nausea and vomiting in adult   . Necrotizing pancreatitis   . Gastritis and gastroduodenitis   . Candida esophagitis (HCC)   . Acute pancreatitis 07/17/2019  . Schizophrenia (HCC) 09/02/2018  . Obesity, Class III, BMI 40-49.9 (morbid obesity) (HCC) 09/02/2018  .  Iron deficiency 09/02/2018  . Symptomatic anemia 09/01/2018  . Hypokalemia 09/01/2018  . Severe sepsis (HCC) 08/11/2018  . AKI (acute kidney injury) (HCC) 08/11/2018  . Hypotension 08/11/2018  . Type 2 diabetes mellitus (HCC) 08/11/2018  . Acute appendicitis 05/22/2014  . Ureteral calculus, right 05/02/2013    Past Surgical History:  Procedure Laterality Date  . BIOPSY  07/18/2019   Procedure: BIOPSY;  Surgeon: Shellia Cleverly, DO;  Location: [facility];  Service: Gastroenterology;;  . CESAREAN SECTION  1991  &  2002   w/ bilateral tubal ligation in 2002  . CYSTOSCOPY W/ URETERAL STENT PLACEMENT Right 08/10/2018   Procedure: CYSTOSCOPY WITH RETROGRADE PYELOGRAM/URETERAL STENT PLACEMENT;  Surgeon: Jerilee Field, MD;  Location: [facility];  Service: Urology;  Laterality: Right;  . CYSTOSCOPY WITH RETROGRADE PYELOGRAM, URETEROSCOPY AND STENT PLACEMENT Bilateral 05/02/2013   Procedure: CYSTOSCOPY WITH RETROGRADE PYELOGRAM, URETEROSCOPY ;  Surgeon: Valetta Fuller, MD;  Location: [facility];  Service: Urology;  Laterality: Bilateral;  . CYSTOSCOPY WITH RETROGRADE PYELOGRAM, URETEROSCOPY AND STENT PLACEMENT Right 08/15/2018   Procedure: CYSTOSCOPY WITH RIGHT RETROGRADE PYELOGRAM, RIGHT URETEROSCOPY WITH HOLMIUM LASER AND STENT PLACEMENT;  Surgeon: Crista Elliot, MD;  [facility]: [facility];  Service: Urology;  Laterality: Right;  . CYSTOSCOPY WITH STENT PLACEMENT Left 05/02/2013   Procedure: CYSTOSCOPY WITH STENT PLACEMENT;  Surgeon: Valetta Fuller, MD;  Location: [facility];  Service: Urology;  Laterality: Left;  . ESOPHAGOGASTRODUODENOSCOPY (EGD) WITH PROPOFOL N/A 07/18/2019   Procedure: ESOPHAGOGASTRODUODENOSCOPY (EGD) WITH PROPOFOL;  Surgeon: Lavena Bullion, DO;  [facility];  Service: Gastroenterology;  Laterality: N/A;  . HOLMIUM LASER APPLICATION Bilateral 64/40/3474   Procedure: HOLMIUM LASER APPLICATION;  Surgeon: Bernestine Amass, MD;   Location: [facility];  Service: Urology;  Laterality: Bilateral;  . LAPAROSCOPIC APPENDECTOMY N/A 05/22/2014   Procedure: APPENDECTOMY LAPAROSCOPIC;  Surgeon: Excell Seltzer, MD;  [facility]: [facility];  Service: General;  Laterality: N/A;  . TONSILLECTOMY  age 12     OB History   No obstetric history on file.     No family history on file.  Social History   Tobacco Use  . Smoking status: Never Smoker  . Smokeless tobacco: Never Used  Substance Use Topics  . Alcohol use: No  . Drug use: No    Home Medications Prior to Admission medications   Medication Sig Start Date End Date Taking? Authorizing Provider  acetaminophen (TYLENOL) 325 MG tablet Take 2 tablets (650 mg total) by mouth every 6 (six) hours as needed for mild pain (or Fever >/= 101). 07/21/19  Yes Regalado, Belkys A, MD  albuterol (VENTOLIN HFA) 108 (90 Base) MCG/ACT inhaler Inhale 1-2 puffs into the lungs every 6 (six) hours as needed for wheezing or shortness of breath. 07/21/19  Yes Regalado, Belkys A, MD  amitriptyline (ELAVIL) 50 MG tablet Take 50 mg by mouth at bedtime. 07/22/19  Yes [provider]  amLODipine (NORVASC) 5 MG tablet Take 5 mg by mouth daily. 07/26/19  Yes [provider]  clonazePAM (KLONOPIN) 0.5 MG tablet Take 0.5 mg by mouth daily as needed for anxiety. 05/23/19  Yes [provider]  fluconazole (DIFLUCAN) 200 MG tablet Take 1 tablet (200 mg total) by mouth daily for 20 days. 07/21/19 08/10/19 Yes Regalado, Belkys A, MD  lamoTRIgine (LAMICTAL) 200 MG tablet Take 200 mg by mouth daily. 06/06/19  Yes [provider]  LANTUS SOLOSTAR 100 UNIT/ML Solostar Pen Inject 15 Units into the skin at bedtime. 07/21/19  Yes Regalado, Belkys A, MD  lisinopril-hydrochlorothiazide (ZESTORETIC) 20-12.5 MG tablet Take 1 tablet by mouth 2 (two) times daily. 07/31/19  Yes [provider]  lurasidone (LATUDA) 40 MG TABS tablet Take 1 tablet (40 mg total) by mouth at  bedtime. 07/21/19  Yes Regalado, Belkys A, MD  OLANZapine (ZYPREXA) 5 MG tablet Take 5 mg by mouth at bedtime. 06/01/19  Yes [provider]  ondansetron (ZOFRAN) 8 MG tablet Take 8 mg by mouth every 8 (eight) hours as needed for nausea or vomiting.  07/26/19  Yes [provider]  oxyCODONE (ROXICODONE) 5 MG immediate release tablet Take 1 tablet (5 mg total) by mouth every 4 (four) hours as needed for severe pain. 07/29/19  Yes Sherwood Gambler, MD  senna (SENOKOT) 8.6 MG TABS tablet Take 1 tablet (8.6 mg total) by mouth 2 (two) times daily. 07/21/19  Yes Regalado, Belkys A, MD  SYMBICORT 160-4.5 MCG/ACT inhaler Inhale 2 puffs into the lungs 2 (two) times daily as needed (SOB/wheezing).  04/26/19  Yes [provider]  traZODone (DESYREL) 100 MG tablet Take 100 mg by mouth at bedtime.   Yes [provider]  TRULICITY 4.5 QV/9.5GL SOPN Inject 4.5 mg into the skin once a week. Take on Mondays 07/29/19  Yes [provider]  ondansetron (ZOFRAN ODT) 4 MG disintegrating tablet Take 1  tablet (4 mg total) by mouth every 8 (eight) hours as needed. Patient not taking: Reported on 08/03/2019 07/29/19   Pricilla Loveless, MD  pantoprazole (PROTONIX) 40 MG tablet Take 1 tablet (40 mg total) by mouth 2 (two) times daily. Patient not taking: Reported on 08/03/2019 07/21/19   Alba Cory, MD    Allergies    Patient has no known allergies.  Review of Systems   Review of Systems  Constitutional: Negative for appetite change, diaphoresis, fatigue, fever and unexpected weight change.  HENT: Negative for mouth sores.   Eyes: Negative for visual disturbance.  Respiratory: Negative for cough, chest tightness, shortness of breath and wheezing.   Cardiovascular: Negative for chest pain.  Gastrointestinal: Positive for abdominal pain, nausea and vomiting. Negative for constipation and diarrhea.  Endocrine: Negative for polydipsia, polyphagia and polyuria.  Genitourinary: Negative  for dysuria, frequency, hematuria and urgency.  Musculoskeletal: Negative for back pain and neck stiffness.  Skin: Negative for rash.  Allergic/Immunologic: Negative for immunocompromised state.  Neurological: Negative for syncope, light-headedness and headaches.  Hematological: Does not bruise/bleed easily.  Psychiatric/Behavioral: Negative for sleep disturbance. The patient is not nervous/anxious.     Physical Exam Updated Vital Signs BP (!) 183/107 (BP Location: Left Arm)   Pulse (!) 114   Temp 99 F (37.2 C) (Oral)   Resp 20   LMP 03/23/2014 Comment: patient states her periods are irregular  SpO2 96%   Physical Exam Vitals and nursing note reviewed.  Constitutional:      General: She is not in acute distress.    Appearance: She is obese. She is not diaphoretic.  HENT:     Head: Normocephalic.  Eyes:     General: No scleral icterus.    Conjunctiva/sclera: Conjunctivae normal.  Cardiovascular:     Rate and Rhythm: Regular rhythm. Tachycardia present.     Pulses: Normal pulses.          Radial pulses are 2+ on the right side and 2+ on the left side.  Pulmonary:     Effort: No tachypnea, accessory muscle usage, prolonged expiration, respiratory distress or retractions.     Breath sounds: No stridor.     Comments: Equal chest rise. No increased work of breathing. Abdominal:     General: Bowel sounds are normal. There is no distension.     Palpations: Abdomen is soft.     Tenderness: There is abdominal tenderness in the right upper quadrant, epigastric area and left upper quadrant. There is no guarding or rebound.  Musculoskeletal:     Cervical back: Normal range of motion.     Comments: Moves all extremities equally and without difficulty.  Skin:    General: Skin is warm and dry.     Capillary Refill: Capillary refill takes less than 2 seconds.  Neurological:     Mental Status: She is alert.     GCS: GCS eye subscore is 4. GCS verbal subscore is 5. GCS motor subscore  is 6.     Comments: Speech is clear and goal oriented.  Psychiatric:        Mood and Affect: Mood normal.     ED Results / Procedures / Treatments   Labs (all labs ordered are listed, but only abnormal results are displayed) Labs Reviewed  COMPREHENSIVE METABOLIC PANEL - Abnormal; Notable for the following components:      Result Value   Glucose, Bld 253 (*)    All other components within normal limits  CBC -  Abnormal; Notable for the following components:   Hemoglobin 10.2 (*)    HCT 35.4 (*)    MCV 78.7 (*)    MCH 22.7 (*)    MCHC 28.8 (*)    RDW 15.9 (*)    All other components within normal limits  URINALYSIS, ROUTINE W REFLEX MICROSCOPIC - Abnormal; Notable for the following components:   APPearance CLOUDY (*)    Hgb urine dipstick SMALL (*)    Protein, ur 100 (*)    Leukocytes,Ua LARGE (*)    WBC, UA >50 (*)    Bacteria, UA MANY (*)    All other components within normal limits  LIPASE, BLOOD    Procedures Procedures (including critical care time)  Medications Ordered in ED Medications  HYDROmorphone (DILAUDID) injection 1 mg (1 mg Intravenous Given 08/03/19 0145)  ondansetron (ZOFRAN) injection 4 mg (4 mg Intravenous Given 08/03/19 0144)  sodium chloride 0.9 % bolus 500 mL (500 mLs Intravenous New Bag/Given 08/03/19 0144)    ED Course  I have reviewed the triage vital signs and the nursing notes.  Pertinent labs & imaging results that were available during my care of the patient were reviewed by me and considered in my medical decision making (see chart for details).    MDM Rules/Calculators/A&P                       Patient presents with epigastric abdominal pain.  She has a history of pancreatitis and pancreatic pseudocyst.  Labs today are largely reassuring without leukocytosis, elevation in AST/ALT or lipase.  No evidence of urinary tract infection.  Patient reports uncontrolled pain and emesis at home.  Records reviewed.  Patient had CT scan of her  abdomen on 07/17/2019 showed extensive intra pancreatic and peripancreatic fluid collections.  She was admitted this time.  She presented on 07/29/2019 with persistent abdominal pain.  CT scan at that time showed persistent pancreatic fluid collections but no new collections.  She continued to have peripancreatic inflammatory changes.  Patient's labs are reassuring and she is not clinically dehydrated however she is persistently tachycardic and reporting failure of outpatient therapies.  Will admit for pain control and po progression.  Discussed patient's case with hospitalist, Dr. Toniann Fail.  I have recommended admission and patient (and family if present) agree with this plan. Admitting physician will place admission orders.     .Final Clinical Impression(s) / ED Diagnoses Final diagnoses:  Acute pancreatitis, unspecified complication status, unspecified pancreatitis type    Rx / DC Orders ED Discharge Orders    None       Daziyah Cogan, Boyd Kerbs 08/03/19 0228    Mesner, Barbara Cower, MD 08/03/19 [phone]

## 2019-08-03 NOTE — H&P (Signed)
History and Physical    Monique Holland DPO:[id number] DOB: 1965/11/19 DOA: 08/02/2019  PCP: Kristie Cowman, MD  Patient coming from: Home.  Chief Complaint: Abdominal pain nausea vomiting.  HPI: Monique Holland is a 54 y.o. female with history of diabetes mellitus type 2, hypertension who was recently admitted from hematemesis underwent EGD showed Candida esophagitis was on Diflucan prior to which patient was admitted for necrotizing pancreatitis with hypercalcemia at [facility] was on tube feeds presently off tube feeds presents to the ER for the second time in last 4 days with persistent epigastric abdominal pain with nausea vomiting.  Denies any blood in the vomitus.  Pain increases when eating food.  Denies any diarrhea.  Denies chest pain or shortness of breath or productive cough.  4 days ago when patient came to the ER patient's CAT scan abdomen pelvis done showed peripancreatic and pancreatic fluid collection stable with no new changes.  ED Course: In the ER today patient was having persistent pain and labs show normal LFTs with the complete metabolic panel showing anemia which is stable.  UA shows features concerning for UTI.  Covid test is pending.  Given the persistent pain and recent necrotizing pancreatitis patient admitted for further hydration and further work-up.  Review of Systems: As per HPI, rest all negative.   Past Medical History:  Diagnosis Date  . Anxiety   . Asthma   . Depression   . Diabetes mellitus without complication ([place])   . Frequency of urination   . GERD (gastroesophageal reflux disease)   . History of asthma    last episode yrs ago  . Hypertension   . OSA (obstructive sleep apnea)    pt had study done oct 2014--  schedule for cpap titrate after kidney stone surgery  . Pancreatitis   . Schizophrenia ([place])   . Ureteral calculi    BILATERAL  . Wears glasses     Past Surgical History:  Procedure Laterality Date  . BIOPSY  07/18/2019   Procedure:  BIOPSY;  Surgeon: Lavena Bullion, DO;  [facility];  Service: Gastroenterology;;  . [place]  &  2002   w/ bilateral tubal ligation in 2002  . CYSTOSCOPY W/ URETERAL STENT PLACEMENT Right 08/10/2018   Procedure: CYSTOSCOPY WITH RETROGRADE PYELOGRAM/URETERAL STENT PLACEMENT;  Surgeon: Festus Aloe, MD;  [facility];  Service: Urology;  Laterality: Right;  . CYSTOSCOPY WITH RETROGRADE PYELOGRAM, URETEROSCOPY AND STENT PLACEMENT Bilateral 05/02/2013   Procedure: CYSTOSCOPY WITH RETROGRADE PYELOGRAM, URETEROSCOPY ;  Surgeon: Bernestine Amass, MD;  Location: [facility];  Service: Urology;  Laterality: Bilateral;  . CYSTOSCOPY WITH RETROGRADE PYELOGRAM, URETEROSCOPY AND STENT PLACEMENT Right 08/15/2018   Procedure: CYSTOSCOPY WITH RIGHT RETROGRADE PYELOGRAM, RIGHT URETEROSCOPY WITH HOLMIUM LASER AND STENT PLACEMENT;  Surgeon: Lucas Mallow, MD;  [facility];  Service: Urology;  Laterality: Right;  . CYSTOSCOPY WITH STENT PLACEMENT Left 05/02/2013   Procedure: CYSTOSCOPY WITH STENT PLACEMENT;  Surgeon: Bernestine Amass, MD;  Location[facility];  Service: Urology;  Laterality: Left;  . ESOPHAGOGASTRODUODENOSCOPY (EGD) WITH PROPOFOL N/A 07/18/2019   Procedure: ESOPHAGOGASTRODUODENOSCOPY (EGD) WITH PROPOFOL;  Surgeon: Lavena Bullion, DO;  [facility];  Service: Gastroenterology;  Laterality: N/A;  . HOLMIUM LASER APPLICATION Bilateral 43/15/4008   Procedure: HOLMIUM LASER APPLICATION;  Surgeon: Bernestine Amass, MD;  Location: [facility];  Service: Urology;  Laterality: Bilateral;  . LAPAROSCOPIC APPENDECTOMY N/A 05/22/2014  Procedure: APPENDECTOMY LAPAROSCOPIC;  Surgeon: Glenna Fellows, MD;  [facility];  Service: General;  Laterality: N/A;  . TONSILLECTOMY  age 74     reports that she has never smoked. She has never used smokeless tobacco. She reports that she does not drink alcohol or use  drugs.  No Known Allergies  Family History  Family history unknown: Yes    Prior to Admission medications   Medication Sig Start Date End Date Taking? Authorizing Provider  acetaminophen (TYLENOL) 325 MG tablet Take 2 tablets (650 mg total) by mouth every 6 (six) hours as needed for mild pain (or Fever >/= 101). 07/21/19  Yes Regalado, Belkys A, MD  albuterol (VENTOLIN HFA) 108 (90 Base) MCG/ACT inhaler Inhale 1-2 puffs into the lungs every 6 (six) hours as needed for wheezing or shortness of breath. 07/21/19  Yes Regalado, Belkys A, MD  amitriptyline (ELAVIL) 50 MG tablet Take 50 mg by mouth at bedtime. 07/22/19  Yes [provider]  amLODipine (NORVASC) 5 MG tablet Take 5 mg by mouth daily. 07/26/19  Yes [provider]  clonazePAM (KLONOPIN) 0.5 MG tablet Take 0.5 mg by mouth daily as needed for anxiety. 05/23/19  Yes [provider]  fluconazole (DIFLUCAN) 200 MG tablet Take 1 tablet (200 mg total) by mouth daily for 20 days. 07/21/19 08/10/19 Yes Regalado, Belkys A, MD  lamoTRIgine (LAMICTAL) 200 MG tablet Take 200 mg by mouth daily. 06/06/19  Yes [provider]  LANTUS SOLOSTAR 100 UNIT/ML Solostar Pen Inject 15 Units into the skin at bedtime. 07/21/19  Yes Regalado, Belkys A, MD  lisinopril-hydrochlorothiazide (ZESTORETIC) 20-12.5 MG tablet Take 1 tablet by mouth 2 (two) times daily. 07/31/19  Yes [provider]  lurasidone (LATUDA) 40 MG TABS tablet Take 1 tablet (40 mg total) by mouth at bedtime. 07/21/19  Yes Regalado, Belkys A, MD  OLANZapine (ZYPREXA) 5 MG tablet Take 5 mg by mouth at bedtime. 06/01/19  Yes [provider]  ondansetron (ZOFRAN) 8 MG tablet Take 8 mg by mouth every 8 (eight) hours as needed for nausea or vomiting.  07/26/19  Yes [provider]  oxyCODONE (ROXICODONE) 5 MG immediate release tablet Take 1 tablet (5 mg total) by mouth every 4 (four) hours as needed for severe pain. 07/29/19  Yes Pricilla Loveless, MD  senna  (SENOKOT) 8.6 MG TABS tablet Take 1 tablet (8.6 mg total) by mouth 2 (two) times daily. 07/21/19  Yes Regalado, Belkys A, MD  SYMBICORT 160-4.5 MCG/ACT inhaler Inhale 2 puffs into the lungs 2 (two) times daily as needed (SOB/wheezing).  04/26/19  Yes [provider]  traZODone (DESYREL) 100 MG tablet Take 100 mg by mouth at bedtime.   Yes [provider]  TRULICITY 4.5 MG/0.5ML SOPN Inject 4.5 mg into the skin once a week. Take on Mondays 07/29/19  Yes [provider]  ondansetron (ZOFRAN ODT) 4 MG disintegrating tablet Take 1 tablet (4 mg total) by mouth every 8 (eight) hours as needed. Patient not taking: Reported on 08/03/2019 07/29/19   Pricilla Loveless, MD  pantoprazole (PROTONIX) 40 MG tablet Take 1 tablet (40 mg total) by mouth 2 (two) times daily. Patient not taking: Reported on 08/03/2019 07/21/19   Alba Cory, MD    Physical Exam: Constitutional: Moderately built and nourished. Vitals:   08/03/19 0215 08/03/19 0245 08/03/19 0300 08/03/19 0315  BP: (!) 168/104 (!) 181/101 (!) 184/111 (!) 171/123  Pulse: (!) 117 (!) 111 (!) 113 (!) 107  Resp: 17  15  Temp: 98.8 F (37.1 C)     TempSrc: Oral     SpO2: 95% 99% 96% 97%   Eyes: Anicteric no pallor. ENMT: No discharge from the ears eyes nose or mouth. Neck: No mass felt.  No neck rigidity. Respiratory: No rhonchi or crepitations. Cardiovascular: S1-S2 heard. Abdomen: Epigastric tenderness present no guarding no rigidity no rebound tenderness. Musculoskeletal: No edema. Skin: No rash. Neurologic: Alert awake oriented to time place and person.  Moves all extremities. Psychiatric: Appears normal per normal affect.   Labs on Admission: I have personally reviewed following labs and imaging studies  CBC: Recent Labs  Lab 07/29/19 1724 08/02/19 2045  WBC 6.6 5.7  HGB 10.4* 10.2*  HCT 36.3 35.4*  MCV 77.9* 78.7*  PLT 315 268   Basic Metabolic Panel: Recent Labs  Lab 07/29/19 1724 08/02/19 2045   NA 131* 137  K 4.0 3.5  CL 97* 100  CO2 23 26  GLUCOSE 361* 253*  BUN 9 8  CREATININE 0.98 0.82  CALCIUM 9.1 9.0   GFR: Estimated Creatinine Clearance: 120 mL/min (by C-G formula based on SCr of 0.82 mg/dL). Liver Function Tests: Recent Labs  Lab 07/29/19 1724 08/02/19 2045  AST 23 23  ALT 16 15  ALKPHOS 89 76  BILITOT 0.5 0.4  PROT 7.3 7.2  ALBUMIN 3.8 3.7   Recent Labs  Lab 07/29/19 1724 08/02/19 2045  LIPASE 50 39   No results for input(s): AMMONIA in the last 168 hours. Coagulation Profile: No results for input(s): INR, PROTIME in the last 168 hours. Cardiac Enzymes: No results for input(s): CKTOTAL, CKMB, CKMBINDEX, TROPONINI in the last 168 hours. BNP (last 3 results) No results for input(s): PROBNP in the last 8760 hours. HbA1C: No results for input(s): HGBA1C in the last 72 hours. CBG: No results for input(s): GLUCAP in the last 168 hours. Lipid Profile: No results for input(s): CHOL, HDL, LDLCALC, TRIG, CHOLHDL, LDLDIRECT in the last 72 hours. Thyroid Function Tests: No results for input(s): TSH, T4TOTAL, FREET4, T3FREE, THYROIDAB in the last 72 hours. Anemia Panel: No results for input(s): VITAMINB12, FOLATE, FERRITIN, TIBC, IRON, RETICCTPCT in the last 72 hours. Urine analysis:    Component Value Date/Time   COLORURINE YELLOW 08/02/2019 2044   APPEARANCEUR CLOUDY (A) 08/02/2019 2044   LABSPEC 1.011 08/02/2019 2044   PHURINE 6.0 08/02/2019 2044   GLUCOSEU NEGATIVE 08/02/2019 2044   HGBUR SMALL (A) 08/02/2019 2044   BILIRUBINUR NEGATIVE 08/02/2019 2044   KETONESUR NEGATIVE 08/02/2019 2044   PROTEINUR 100 (A) 08/02/2019 2044   UROBILINOGEN 0.2 02/12/2015 1348   NITRITE NEGATIVE 08/02/2019 2044   LEUKOCYTESUR LARGE (A) 08/02/2019 2044   Sepsis Labs: @LABRCNTIP (procalcitonin:4,lacticidven:4) )No results found for this or any previous visit (from the past 240 hour(s)).   Radiological Exams on Admission: No results  found.   Assessment/Plan Principal Problem:   Abdominal pain Active Problems:   Type 2 diabetes mellitus (HCC)   Essential hypertension    1. Abdominal pain with persistent nausea vomiting -pain is mostly in the epigastric area with recent history of necrotizing pancreatitis will get MRCP in the morning.  We will keep patient n.p.o. except medications and continue hydration and pain relief medications. 2. Possible UTI on ceftriaxone.  Check urine cultures. 3. Uncontrolled hypertension likely from pain.  We will keep patient n.p.o. and have hydralazine for now. 4. Diabetes mellitus type 2 uncontrolled with hyperglycemia -keep patient on sliding scale coverage.  Patient also takes long-acting insulin which will  be continued as no hypoglycemic episodes.  5. Anemia with recent EGD showing esophagitis.  We will keep patient on Protonix. 6. History of sleep apnea.  Covid test is pending.  Given that patient has significant abdominal pain with persistent vomiting and recent history of necrotizing pancreatitis will need close monitoring for any further deterioration in inpatient status.   DVT prophylaxis: SCDs for now until we get MRCP results. Code Status: Full code. Family Communication: Discussed with patient. Disposition Plan: Home. Consults called: None. Admission status: Inpatient.   Eduard Clos MD [facility] Pager [phone].  If 7PM-7AM, please contact night-coverage [email or web address] Password [facility]  08/03/2019, 4:19 AM

## 2019-08-03 NOTE — Progress Notes (Signed)
End of shift note: Patient is resting in bed. Pulse has decreased to low 100s. Respirations are within normal limit. Patient remains alert, cooperative, and oriented x4. Denies any adverse side effects to Metoprolol. Patient reports she ate 3 bites of cheeseburger and 3 potato wedges as well as drank lemonade for dinner. Present on bedside table is a piece of cake in a plastic container. Patient states she will eat cake later in evening. Care transitioned to evening nurse.   Note: medication reconciliation was completed upon arrival to unit. All home medications were added to [facility] related to HTN and mental health.

## 2019-08-03 NOTE — ED Notes (Signed)
Pt. Documented in error see above note in chart. 

## 2019-08-04 DIAGNOSIS — R1010 Upper abdominal pain, unspecified: Secondary | ICD-10-CM

## 2019-08-04 LAB — GLUCOSE, CAPILLARY
Glucose-Capillary: 181 mg/dL — ABNORMAL HIGH (ref 70–99)
Glucose-Capillary: 183 mg/dL — ABNORMAL HIGH (ref 70–99)
Glucose-Capillary: 210 mg/dL — ABNORMAL HIGH (ref 70–99)
Glucose-Capillary: 212 mg/dL — ABNORMAL HIGH (ref 70–99)
Glucose-Capillary: 222 mg/dL — ABNORMAL HIGH (ref 70–99)

## 2019-08-04 LAB — COMPREHENSIVE METABOLIC PANEL
ALT: 15 U/L (ref 0–44)
AST: 21 U/L (ref 15–41)
Albumin: 3.3 g/dL — ABNORMAL LOW (ref 3.5–5.0)
Alkaline Phosphatase: 67 U/L (ref 38–126)
Anion gap: 11 (ref 5–15)
BUN: 7 mg/dL (ref 6–20)
CO2: 27 mmol/L (ref 22–32)
Calcium: 9 mg/dL (ref 8.9–10.3)
Chloride: 101 mmol/L (ref 98–111)
Creatinine, Ser: 0.68 mg/dL (ref 0.44–1.00)
GFR calc Af Amer: >60 mL/min (ref >60)
GFR calc non Af Amer: >60 mL/min (ref >60)
Glucose, Bld: 202 mg/dL — ABNORMAL HIGH (ref 70–99)
Potassium: 3.5 mmol/L (ref 3.5–5.1)
Sodium: 139 mmol/L (ref 135–145)
Total Bilirubin: 0.6 mg/dL (ref 0.3–1.2)
Total Protein: 6.3 g/dL — ABNORMAL LOW (ref 6.5–8.1)

## 2019-08-04 LAB — CBC
HCT: 32.6 % — ABNORMAL LOW (ref 36.0–46.0)
Hemoglobin: 9.4 g/dL — ABNORMAL LOW (ref 12.0–15.0)
MCH: 22.5 pg — ABNORMAL LOW (ref 26.0–34.0)
MCHC: 28.8 g/dL — ABNORMAL LOW (ref 30.0–36.0)
MCV: 78.2 fL — ABNORMAL LOW (ref 80.0–100.0)
Platelets: 231 10*3/uL (ref 150–400)
RBC: 4.17 MIL/uL (ref 3.87–5.11)
RDW: 15.7 % — ABNORMAL HIGH (ref 11.5–15.5)
WBC: 5 10*3/uL (ref 4.0–10.5)
nRBC: 0 % (ref 0.0–0.2)

## 2019-08-04 LAB — MAGNESIUM: Magnesium: 1.6 mg/dL — ABNORMAL LOW (ref 1.7–2.4)

## 2019-08-04 MED ORDER — PANTOPRAZOLE SODIUM 40 MG PO TBEC
40.0000 mg | DELAYED_RELEASE_TABLET | Freq: Two times a day (BID) | ORAL | Status: DC
  Administered 2019-08-04 – 2019-08-06 (×4): 40 mg via ORAL
  Filled 2019-08-04 (×4): qty 1

## 2019-08-04 MED ORDER — METOPROLOL TARTRATE 12.5 MG HALF TABLET
12.5000 mg | ORAL_TABLET | Freq: Two times a day (BID) | ORAL | Status: DC
  Administered 2019-08-04 – 2019-08-06 (×5): 12.5 mg via ORAL
  Filled 2019-08-04 (×5): qty 1

## 2019-08-04 MED ORDER — INSULIN GLARGINE 100 UNIT/ML ~~LOC~~ SOLN
5.0000 [IU] | Freq: Every day | SUBCUTANEOUS | Status: DC
  Administered 2019-08-04 – 2019-08-05 (×2): 5 [IU] via SUBCUTANEOUS
  Filled 2019-08-04 (×2): qty 0.05

## 2019-08-04 NOTE — Progress Notes (Signed)
   Vital Signs MEWS/VS Documentation      08/04/2019 0001 08/04/2019 0413 08/04/2019 0700 08/04/2019 0806   MEWS Score:  1  1  2  1    MEWS Score Color:  Green  Green  Yellow  Green   Resp:  16  20  -  16   Pulse:  (!) 102  (!) 103  -  (!) 107   BP:  (!) 158/97  (!) 143/85  -  139/87   Temp:  98 F (36.7 C)  98.2 F (36.8 C)  -  98.2 F (36.8 C)   O2 Device:  Room Air  Room Air  -  Room Air      MEWS update: Patient has completed YELLOW MEWS protocol and is now in GREEN MEWS. VSS. BG 183 with 2 units of insulin per SSI. Patient denies any concerns with pain at this time. Patient lying in bed ordering breakfast. Call bell within reach. Will continue to monitor.      Pinson 08/04/2019,8:10 AM

## 2019-08-04 NOTE — Progress Notes (Signed)
PROGRESS NOTE    Monique Holland  QHU:[id number] DOB: September 02, 1965 DOA: 08/02/2019 PCP: Knox Royalty, MD    Brief Narrative: 54 y.o. female with history of diabetes mellitus type 2, hypertension who was recently admitted from hematemesis underwent EGD showed Candida esophagitis was on Diflucan prior to which patient was admitted for necrotizing pancreatitis with hypercalcemia at [facility] was on tube feeds presently off tube feeds presents to the ER for the second time in last 4 days with persistent epigastric abdominal pain with nausea vomiting.  Denies any blood in the vomitus.  Pain increases when eating food.  Denies any diarrhea.  Denies chest pain or shortness of breath or productive cough.  4 days ago when patient came to the ER patient's CAT scan abdomen pelvis done showed peripancreatic and pancreatic fluid collection stable with no new changes.  ED Course: In the ER today patient was having persistent pain and labs show normal LFTs with the complete metabolic panel showing anemia which is stable.  UA shows features concerning for UTI.  Covid test is pending.  Given the persistent pain and recent necrotizing pancreatitis patient admitted for further hydration and further work-up.  Assessment & Plan:   Principal Problem:   Abdominal pain Active Problems:   Type 2 diabetes mellitus (HCC)   Essential hypertension    #1  Gastritis-  patient admitted with epigastric pain nausea and vomiting.  Patient had recent diagnosis of candidal esophagitis on Diflucan.  Continue Diflucan to finish the course.  Continue Protonix.  Discussed with her multiple times to take her diet slowly and gradually up and to have small quantity of meals at frequent times then eating 3 large meals.   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  #2 UTI UA consistent with UTI on Rocephin follow-up urine culture pending.  #3 hypertension blood pressure 139/87 Home medications were restarted yesterday Norvasc, HCTZ, Zestril and metoprolol.  #4 type 2 diabetes CBG (last 3)  Recent Labs    08/04/19 0416 08/04/19 0726 08/04/19 1200  GLUCAP 181* 183* 212*   Restart Lantus 5 units nightly since patient is on a diet.  #5 chronic anemia iron deficient hemoglobin stable at 9.4  #6 history of sleep apnea stable  #7 schizophrenia continue home meds  #8 morbid obesity needs  Ongoing outpatient counseling.  However patient is not at all compliant to any kind of dietary restriction.   Estimated body mass index is 45.32 kg/m as calculated from the following:   Height as of this encounter: 5\' 9"  (1.753 m).   Weight as of this encounter: 139.2 kg.  DVT prophylaxis: Lovenox Code Status: Full code Family Communication: Discussed with patient Disposition Plan: She came from home  Plan is to discharge her home  Barriers to discharge patient should be able to tolerate a diet prior to discharge Consultants:   GI  Procedures:none Antimicrobials:none  Subjective: Complains of pain epigastric with nausea no vomiting or diarrhea continues to have abdominal pain.  She is wondering why the pseudocyst cannot be drained.  Objective: Vitals:   08/04/19 0001 08/04/19 0413 08/04/19 0806 08/04/19 0829  BP: (!) 158/97 (!) 143/85 139/87   Pulse: (!) 102 (!) 103 (!) 107   Resp: 16 20 16    Temp: 98 F (36.7 C) 98.2 F (36.8 C) 98.2 F (36.8 C)   TempSrc:  Oral Oral Oral   SpO2: 100% 96% 94% 95%  Weight:  (!) 139.2 kg    Height:        Intake/Output Summary (Last 24 hours) at 08/04/2019 1321 Last data filed at 08/04/2019 0329 Gross per 24 hour  Intake 1310.72 ml  Output --  Net 1310.72 ml   Filed Weights   08/03/19 1911 08/04/19 0413  Weight: (!) 137.4 kg (!) 139.2 kg    Examination:  General exam: Appears calm and  comfortable  Respiratory system: Clear to auscultation. Respiratory effort normal. Cardiovascular system: S1 & S2 heard, RRR. No JVD, murmurs, rubs, gallops or clicks. No pedal edema. Gastrointestinal system: Abdomen is nondistended, soft and epigastric tenderness. No organomegaly or masses felt. Normal bowel sounds heard. Central nervous system: Alert and oriented. No focal neurological deficits. Extremities: Trace edema bilaterally Skin: No rashes, lesions or ulcers Psychiatry: Judgement and insight appear normal. Mood & affect appropriate.     Data Reviewed: I have personally reviewed following labs and imaging studies  CBC: Recent Labs  Lab 07/29/19 1724 08/02/19 2045 08/03/19 0509 08/04/19 0549  WBC 6.6 5.7 5.4 5.0  HGB 10.4* 10.2* 9.3* 9.4*  HCT 36.3 35.4* 33.1* 32.6*  MCV 77.9* 78.7* 79.2* 78.2*  PLT 315 268 222 231   Basic Metabolic Panel: Recent Labs  Lab 07/29/19 1724 08/02/19 2045 08/03/19 0509 08/04/19 0549  NA 131* 137 138 139  K 4.0 3.5 3.7 3.5  CL 97* 100 104 101  CO2 23 26 26 27   GLUCOSE 361* 253* 246* 202*  BUN 9 8 8 7   CREATININE 0.98 0.82 0.72 0.68  CALCIUM 9.1 9.0 8.9 9.0  MG  --   --   --  1.6*   GFR: Estimated Creatinine Clearance: 123.9 mL/min (by C-G formula based on SCr of 0.68 mg/dL). Liver Function Tests: Recent Labs  Lab 07/29/19 1724 08/02/19 2045 08/03/19 0509 08/04/19 0549  AST 23 23 21 21   ALT 16 15 13 15   ALKPHOS 89 76 66 67  BILITOT 0.5 0.4 0.5 0.6  PROT 7.3 7.2 6.6 6.3*  ALBUMIN 3.8 3.7 3.4* 3.3*   Recent Labs  Lab 07/29/19 1724 08/02/19 2045  LIPASE 50 39   No results for input(s): AMMONIA in the last 168 hours. Coagulation Profile: No results for input(s): INR, PROTIME in the last 168 hours. Cardiac Enzymes: No results for input(s): CKTOTAL, CKMB, CKMBINDEX, TROPONINI in the last 168 hours. BNP (last 3 results) No results for input(s): PROBNP in the last 8760 hours. HbA1C: No results for input(s): HGBA1C in  the last 72 hours. CBG: Recent Labs  Lab 08/03/19 2103 08/04/19 0004 08/04/19 0416 08/04/19 0726 08/04/19 1200  GLUCAP 231* 210* 181* 183* 212*   Lipid Profile: No results for input(s): CHOL, HDL, LDLCALC, TRIG, CHOLHDL, LDLDIRECT in the last 72 hours. Thyroid Function Tests: No results for input(s): TSH, T4TOTAL, FREET4, T3FREE, THYROIDAB in the last 72 hours. Anemia Panel: No results for input(s): VITAMINB12, FOLATE, FERRITIN, TIBC, IRON, RETICCTPCT in the last 72 hours. Sepsis Labs: No results for input(s): PROCALCITON, LATICACIDVEN in the last 168 hours.  Recent Results (from the past 240 hour(s))  SARS CORONAVIRUS 2 (TAT 6-24 HRS) Nasopharyngeal Nasopharyngeal Swab     Status: None   Collection Time: 08/03/19  5:10 AM   Specimen: Nasopharyngeal Swab  Result Value Ref Range Status   SARS Coronavirus 2 NEGATIVE NEGATIVE Final    Comment: (NOTE) SARS-CoV-2 target nucleic acids are NOT DETECTED. The SARS-CoV-2 RNA is generally  detectable in upper and lower respiratory specimens during the acute phase of infection. Negative results do not preclude SARS-CoV-2 infection, do not rule out co-infections with other pathogens, and should not be used as the sole basis for treatment or other patient management decisions. Negative results must be combined with clinical observations, patient history, and epidemiological information. The expected result is Negative. Fact Sheet for Patients: SugarRoll.be Fact Sheet for Healthcare Providers: https://www.woods-.com/ This test is not yet approved or cleared by the [place] FDA and  has been authorized for detection and/or diagnosis of SARS-CoV-2 by FDA under an Emergency Use Authorization (EUA). This EUA will remain  in effect (meaning this test can be used) for the duration of the COVID-19 declaration under Section 56 4(b)(1) of the Act, 21 U.S.C. section 360bbb-3(b)(1), unless the  authorization is terminated or revoked sooner. Performed at [facility], [place] [address] [place],  [address]          Radiology Studies: MR ABDOMEN MRCP WO CONTRAST  Result Date: 08/03/2019 CLINICAL DATA:  Upper abdominal pain with vomiting, follow-up pancreatic cyst/pseudocyst EXAM: MRI ABDOMEN WITHOUT CONTRAST  (INCLUDING MRCP) TECHNIQUE: Multiplanar multisequence MR imaging of the abdomen was performed. Heavily T2-weighted images of the biliary and pancreatic ducts were obtained, and three-dimensional MRCP images were rendered by post processing. COMPARISON:  CT abdomen/pelvis dated 07/29/2019 FINDINGS: Lower chest: Lung bases are clear. Hepatobiliary: Liver is within normal limits. No focal hepatic lesions. No morphologic findings of cirrhosis. No hepatic steatosis. Gallbladder is unremarkable. No intrahepatic or extrahepatic ductal dilatation. Common duct measures 5 mm. No choledocholithiasis is seen. Pancreas: Again noted are peripancreatic fluid/inflammatory changes, predominantly along the pancreatic tail. Two dominant pancreatic fluid collection/pseudocysts, including a bilobed collection measuring 7.9 x 12.6 cm along the pancreatic body/tail (series 16/image 26) and a 4.9 x 6.0 cm collection along the pancreatic tail adjacent to the splenic flexure of colon (series 16/image 30). These are grossly unchanged from recent CT, measuring 7.5 x 12.4 cm and 5.3 x 6.1 cm respectively. Additional 2.1 x 2.5 cm fluid collection in the left anterior abdomen is unchanged. Known parenchymal calcifications in the pancreatic head/uncinate process are not well visualized on the current study. Spleen:  Within normal limits. Adrenals/Urinary Tract:  Adrenal glands are within normal limits. Kidneys are within normal limits. Known nonobstructing calculi are not evident on MR. No hydronephrosis. Stomach/Bowel: Stomach is within normal limits. Visualized bowel is grossly unremarkable.  Vascular/Lymphatic:  No evidence of abdominal aortic aneurysm. No suspicious abdominal lymphadenopathy. Other:  No abdominal ascites. Musculoskeletal: No focal osseous lesions. IMPRESSION: Sequela of prior/chronic pancreatitis, including a dominant 12.6 cm fluid collection/pseudocyst along the pancreatic body/tail, unchanged from recent CT. Mild peripancreatic inflammatory changes/fluid. No cholelithiasis or choledocholithiasis is evident on MR. Electronically Signed   By: Julian Hy M.D.   On: 08/03/2019 11:52   MR 3D Recon At Scanner  Result Date: 08/03/2019 CLINICAL DATA:  Upper abdominal pain with vomiting, follow-up pancreatic cyst/pseudocyst EXAM: MRI ABDOMEN WITHOUT CONTRAST  (INCLUDING MRCP) TECHNIQUE: Multiplanar multisequence MR imaging of the abdomen was performed. Heavily T2-weighted images of the biliary and pancreatic ducts were obtained, and three-dimensional MRCP images were rendered by post processing. COMPARISON:  CT abdomen/pelvis dated 07/29/2019 FINDINGS: Lower chest: Lung bases are clear. Hepatobiliary: Liver is within normal limits. No focal hepatic lesions. No morphologic findings of cirrhosis. No hepatic steatosis. Gallbladder is unremarkable. No intrahepatic or extrahepatic ductal dilatation. Common duct measures 5 mm. No choledocholithiasis is seen. Pancreas: Again noted are  peripancreatic fluid/inflammatory changes, predominantly along the pancreatic tail. Two dominant pancreatic fluid collection/pseudocysts, including a bilobed collection measuring 7.9 x 12.6 cm along the pancreatic body/tail (series 16/image 26) and a 4.9 x 6.0 cm collection along the pancreatic tail adjacent to the splenic flexure of colon (series 16/image 30). These are grossly unchanged from recent CT, measuring 7.5 x 12.4 cm and 5.3 x 6.1 cm respectively. Additional 2.1 x 2.5 cm fluid collection in the left anterior abdomen is unchanged. Known parenchymal calcifications in the pancreatic head/uncinate  process are not well visualized on the current study. Spleen:  Within normal limits. Adrenals/Urinary Tract:  Adrenal glands are within normal limits. Kidneys are within normal limits. Known nonobstructing calculi are not evident on MR. No hydronephrosis. Stomach/Bowel: Stomach is within normal limits. Visualized bowel is grossly unremarkable. Vascular/Lymphatic:  No evidence of abdominal aortic aneurysm. No suspicious abdominal lymphadenopathy. Other:  No abdominal ascites. Musculoskeletal: No focal osseous lesions. IMPRESSION: Sequela of prior/chronic pancreatitis, including a dominant 12.6 cm fluid collection/pseudocyst along the pancreatic body/tail, unchanged from recent CT. Mild peripancreatic inflammatory changes/fluid. No cholelithiasis or choledocholithiasis is evident on MR. Electronically Signed   By: Charline Bills M.D.   On: 08/03/2019 11:52        Scheduled Meds: . amitriptyline  50 mg Oral QHS  . amLODipine  5 mg Oral Daily  . fluconazole  200 mg Oral Daily  . hydrochlorothiazide  12.5 mg Oral Daily  . insulin aspart  0-9 Units Subcutaneous Q4H  . lamoTRIgine  200 mg Oral Daily  . lisinopril  20 mg Oral Daily  . lurasidone  40 mg Oral QHS  . mometasone-formoterol  2 puff Inhalation BID  . OLANZapine  5 mg Oral QHS  . pantoprazole (PROTONIX) IV  40 mg Intravenous Q12H  . senna  1 tablet Oral BID  . traZODone  100 mg Oral QHS   Continuous Infusions: . cefTRIAXone (ROCEPHIN)  IV 1 g (08/04/19 0540)     LOS: 1 day     Alwyn Ren, MD  08/04/2019, 1:21 PM

## 2019-08-04 NOTE — Progress Notes (Signed)
The patient is receiving Protonix by the intravenous route.  Based on criteria approved by the Pharmacy and Therapeutics Committee and the Medical Executive Committee, the medication is being converted to the equivalent oral dose form.  These criteria include: -No active GI bleeding -Able to tolerate diet of full liquids (or better) or tube feeding -Able to tolerate other medications by the oral or enteral route  If you have any questions about this conversion, please contact the Pharmacy Department (phone [phone]).  Thank you.  Otho Bellows PharmD 08/04/2019, 3:04 PM

## 2019-08-05 DIAGNOSIS — K859 Acute pancreatitis without necrosis or infection, unspecified: Secondary | ICD-10-CM

## 2019-08-05 LAB — GLUCOSE, CAPILLARY
Glucose-Capillary: 306 mg/dL — ABNORMAL HIGH (ref 70–99)
Glucose-Capillary: 224 mg/dL — ABNORMAL HIGH (ref 70–99)
Glucose-Capillary: 245 mg/dL — ABNORMAL HIGH (ref 70–99)
Glucose-Capillary: 211 mg/dL — ABNORMAL HIGH (ref 70–99)
Glucose-Capillary: 273 mg/dL — ABNORMAL HIGH (ref 70–99)

## 2019-08-05 MED ORDER — INSULIN ASPART 100 UNIT/ML ~~LOC~~ SOLN
0.0000 [IU] | Freq: Three times a day (TID) | SUBCUTANEOUS | Status: DC
  Administered 2019-08-06: 3 [IU] via SUBCUTANEOUS

## 2019-08-05 MED ORDER — PANTOPRAZOLE SODIUM 40 MG PO TBEC: 40.0000 mg | DELAYED_RELEASE_TABLET | Freq: Two times a day (BID) | ORAL | 1 refills | Status: DC

## 2019-08-05 MED ORDER — INSULIN GLARGINE 100 UNIT/ML ~~LOC~~ SOLN
10.0000 [IU] | Freq: Every day | SUBCUTANEOUS | Status: DC
  Filled 2019-08-05: qty 0.1

## 2019-08-05 MED ORDER — INSULIN ASPART 100 UNIT/ML ~~LOC~~ SOLN: 0.0000 [IU] | Freq: Every day | SUBCUTANEOUS | Status: DC

## 2019-08-05 MED ORDER — HYOSCYAMINE SULFATE 0.125 MG SL SUBL
0.2500 mg | SUBLINGUAL_TABLET | Freq: Once | SUBLINGUAL | Status: AC
  Administered 2019-08-05: 0.25 mg via SUBLINGUAL
  Filled 2019-08-05: qty 2

## 2019-08-05 MED ORDER — SUCRALFATE 1 GM/10ML PO SUSP
1.0000 g | Freq: Three times a day (TID) | ORAL | Status: DC
  Administered 2019-08-05 – 2019-08-06 (×4): 1 g via ORAL
  Filled 2019-08-05 (×3): qty 10

## 2019-08-05 MED ORDER — OXYCODONE HCL 5 MG PO TABS
5.0000 mg | ORAL_TABLET | ORAL | Status: DC | PRN
  Administered 2019-08-05 – 2019-08-06 (×3): 5 mg via ORAL
  Filled 2019-08-05 (×3): qty 1

## 2019-08-05 MED ORDER — METOPROLOL TARTRATE 25 MG PO TABS: 12.5000 mg | ORAL_TABLET | Freq: Two times a day (BID) | ORAL | 1 refills | Status: DC

## 2019-08-05 NOTE — Progress Notes (Signed)
Patient refusing discharge at this time.  MD notified.

## 2019-08-05 NOTE — Progress Notes (Signed)
PROGRESS NOTE    Monique Holland  WJX:[id number] DOB: 05/28/1965 DOA: 08/02/2019 PCP: Knox Royalty, MD    Brief Narrative:54 y.o.femalewithhistory of diabetes mellitus type 2, hypertension who was recently admitted from hematemesis underwent EGD showed Candida esophagitis was on Diflucan prior to which patient was admitted for necrotizing pancreatitis with hypercalcemia at [facility] was on tube feeds presently off tube feeds presents to the ER for the second time in last 4 days with persistent epigastric abdominal pain with nausea vomiting. Denies any blood in the vomitus. Pain increases when eating food. Denies any diarrhea. Denies chest pain or shortness of breath or productive cough. 4 days ago when patient came to the ER patient's CAT scan abdomen pelvis done showed peripancreatic and pancreatic fluid collection stable with no new changes.  ED Course:In the ER today patient was having persistent pain and labs show normal LFTs with the complete metabolic panel showing anemia which is stable. UA shows features concerning for UTI. Covid test is pending. Given the persistent pain and recent necrotizing pancreatitis patient admitted for further hydration and further work-up.  Assessment & Plan:   Principal Problem:   Abdominal pain Active Problems:   Type 2 diabetes mellitus (HCC)   Essential hypertension    #1  Gastritis/esophagitis-  patient admitted with epigastric pain nausea and vomiting.  Patient had recent diagnosis of candidal esophagitis on Diflucan.  Continue Diflucan to finish the course.  Continue Protonix.  Discussed with her multiple times to take her diet slowly and gradually up and to have small quantity of meals at frequent times then eating 3 large meals.   She is again refusing to be discharged today.  She is agreeable to be discharged tomorrow.  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  #2 UTI UA consistent with UTI on Rocephin follow-up urine culture pending.  #3 hypertension blood pressure 139/87 Home medications were restarted yesterday Norvasc, HCTZ, Zestril and metoprolol.  #4 type 2 diabetes-continue Lantus increase Lantus to 10 units. CBG (last 3)  Recent Labs    08/04/19 2004 08/05/19 0726 08/05/19 1220  GLUCAP 306* 224* 245*    #5 chronic anemia iron deficient hemoglobin stable at 9.4  #6 history of sleep apnea stable  #7 schizophrenia continue home meds  #8 morbid obesity needs  Ongoing outpatient counseling.  However patient is not at all compliant to any kind of dietary restriction.       Nutrition Problem: Inadequate oral intake Etiology: acute illness, nausea, vomiting     Signs/Symptoms: per patient/family report, meal completion < 50%    Interventions: Refer to RD note for recommendations  Estimated body mass index is 44.6 kg/m as calculated from the following:   Height as of this encounter: 5\' 9"  (1.753 m).   Weight as of this encounter: 137 kg.   DVT prophylaxis: Lovenox Code Status: Full code Family Communication: Discussed with patient Disposition Plan: She came from home  Plan is to discharge her home   Barriers to discharge-none patient refusing to go home   Procedures:none Antimicrobials:none  Subjective: She continues to complain of abdominal pain more in the epigastrium however she is tolerating a di she ate chicken and mashed potato for last night without any nausea or vomiting.  Patient is resting in bed she does not appear to be in any distress Objective: Vitals:   08/04/19 2032 08/04/19 2148 08/05/19 0554 08/05/19 0813  BP:  (!) 143/83 08/07/19)  148/94   Pulse:  (!) 103 98 (!) 104  Resp:   16 17  Temp:   98.3 F (36.8 C)   TempSrc:   Oral   SpO2:   94% 98%  Weight: (!) 140.3 kg  (!) 137 kg   Height:        Intake/Output Summary (Last 24 hours) at 08/05/2019  1223 Last data filed at 08/05/2019 0835 Gross per 24 hour  Intake 355 ml  Output --  Net 355 ml   Filed Weights   08/04/19 0413 08/04/19 2032 08/05/19 0554  Weight: (!) 139.2 kg (!) 140.3 kg (!) 137 kg    Examination:  General exam: Appears calm and comfortable  Respiratory system: Clear to auscultation. Respiratory effort normal. Cardiovascular system: S1 & S2 heard, RRR. No JVD, murmurs, rubs, gallops or clicks. No pedal edema. Gastrointestinal system: Abdomen is nondistended, soft and epi gastric tender. No organomegaly or masses felt. Normal bowel sounds heard. Central nervous system: Alert and oriented. No focal neurological deficits. Extremities: Symmetric 5 x 5 power. Skin: No rashes, lesions or ulcers Psychiatry: Judgement and insight appear normal. Mood & affect appropriate.     Data Reviewed: I have personally reviewed following labs and imaging studies  CBC: Recent Labs  Lab 07/29/19 1724 08/02/19 2045 08/03/19 0509 08/04/19 0549  WBC 6.6 5.7 5.4 5.0  HGB 10.4* 10.2* 9.3* 9.4*  HCT 36.3 35.4* 33.1* 32.6*  MCV 77.9* 78.7* 79.2* 78.2*  PLT 315 268 222 231   Basic Metabolic Panel: Recent Labs  Lab 07/29/19 1724 08/02/19 2045 08/03/19 0509 08/04/19 0549  NA 131* 137 138 139  K 4.0 3.5 3.7 3.5  CL 97* 100 104 101  CO2 23 26 26 27   GLUCOSE 361* 253* 246* 202*  BUN 9 8 8 7   CREATININE 0.98 0.82 0.72 0.68  CALCIUM 9.1 9.0 8.9 9.0  MG  --   --   --  1.6*   GFR: Estimated Creatinine Clearance: 122.7 mL/min (by C-G formula based on SCr of 0.68 mg/dL). Liver Function Tests: Recent Labs  Lab 07/29/19 1724 08/02/19 2045 08/03/19 0509 08/04/19 0549  AST 23 23 21 21   ALT 16 15 13 15   ALKPHOS 89 76 66 67  BILITOT 0.5 0.4 0.5 0.6  PROT 7.3 7.2 6.6 6.3*  ALBUMIN 3.8 3.7 3.4* 3.3*   Recent Labs  Lab 07/29/19 1724 08/02/19 2045  LIPASE 50 39   No results for input(s): AMMONIA in the last 168 hours. Coagulation Profile: No results for input(s):  INR, PROTIME in the last 168 hours. Cardiac Enzymes: No results for input(s): CKTOTAL, CKMB, CKMBINDEX, TROPONINI in the last 168 hours. BNP (last 3 results) No results for input(s): PROBNP in the last 8760 hours. HbA1C: No results for input(s): HGBA1C in the last 72 hours. CBG: Recent Labs  Lab 08/04/19 1200 08/04/19 1643 08/04/19 2004 08/05/19 0726 08/05/19 1220  GLUCAP 212* 222* 306* 224* 245*   Lipid Profile: No results for input(s): CHOL, HDL, LDLCALC, TRIG, CHOLHDL, LDLDIRECT in the last 72 hours. Thyroid Function Tests: No results for input(s): TSH, T4TOTAL, FREET4, T3FREE, THYROIDAB in the last 72 hours. Anemia Panel: No results for input(s): VITAMINB12, FOLATE, FERRITIN, TIBC, IRON, RETICCTPCT in the last 72 hours. Sepsis Labs: No results for input(s): PROCALCITON, LATICACIDVEN in the last 168 hours.  Recent Results (from the past 240 hour(s))  SARS CORONAVIRUS 2 (TAT 6-24 HRS) Nasopharyngeal Nasopharyngeal Swab     Status: None   Collection Time: 08/03/19  5:10  AM   Specimen: Nasopharyngeal Swab  Result Value Ref Range Status   SARS Coronavirus 2 NEGATIVE NEGATIVE Final    Comment: (NOTE) SARS-CoV-2 target nucleic acids are NOT DETECTED. The SARS-CoV-2 RNA is generally detectable in upper and lower respiratory specimens during the acute phase of infection. Negative results do not preclude SARS-CoV-2 infection, do not rule out co-infections with other pathogens, and should not be used as the sole basis for treatment or other patient management decisions. Negative results must be combined with clinical observations, patient history, and epidemiological information. The expected result is Negative. Fact Sheet for Patients: SugarRoll.be Fact Sheet for Healthcare Providers: https://www.woods-[place].com/ This test is not yet approved or cleared by the [place] FDA and  has been authorized for detection and/or diagnosis  of SARS-CoV-2 by FDA under an Emergency Use Authorization (EUA). This EUA will remain  in effect (meaning this test can be used) for the duration of the COVID-19 declaration under Section 56 4(b)(1) of the Act, 21 U.S.C. section 360bbb-3(b)(1), unless the authorization is terminated or revoked sooner. Performed at [facility], [place] [address] [place], [place] [address]          Radiology Studies: No results found.      Scheduled Meds: . amitriptyline  50 mg Oral QHS  . amLODipine  5 mg Oral Daily  . fluconazole  200 mg Oral Daily  . hydrochlorothiazide  12.5 mg Oral Daily  . hyoscyamine  0.25 mg Sublingual Once  . insulin aspart  0-9 Units Subcutaneous Q4H  . insulin glargine  5 Units Subcutaneous QHS  . lamoTRIgine  200 mg Oral Daily  . lisinopril  20 mg Oral Daily  . lurasidone  40 mg Oral QHS  . metoprolol tartrate  12.5 mg Oral BID  . mometasone-formoterol  2 puff Inhalation BID  . OLANZapine  5 mg Oral QHS  . pantoprazole  40 mg Oral BID  . senna  1 tablet Oral BID  . sucralfate  1 g Oral TID WC & HS  . traZODone  100 mg Oral QHS   Continuous Infusions: . cefTRIAXone (ROCEPHIN)  IV 1 g (08/05/19 0558)     LOS: 2 days     Georgette Shell, MD 08/05/2019, 12:23 PM

## 2019-08-05 NOTE — Progress Notes (Signed)
Pt walked the entire hallway x 1 - HR up to 140s during the walk, pt tolerated well without complaints

## 2019-08-05 NOTE — Discharge Summary (Addendum)
Physician Discharge Summary  Monique AdlerLisa Ann Holland ZOX:[id number] DOB: 06/19/1965 DOA: 08/02/2019  PCP: Knox RoyaltyJones, Enrico, MD  Admit date: 08/02/2019 Discharge date: 08/06/19  Admitted From: Home  disposition: Home Recommendations for Outpatient Follow-up:  1. Follow up with PCP in 1-2 weeks 2. Please obtain BMP/CBC in one week  Home Health: None  equipment/Devices: None  discharge Condition stable and improved  CODE STATUS: Full code Diet recommendation: Cardiac low-fat  brief/Interim Summary:53 y.o.femalewithhistory of diabetes mellitus type 2, hypertension who was recently admitted from hematemesis underwent EGD showed Candida esophagitis was on Diflucan prior to which patient was admitted for necrotizing pancreat[facility] with [facility] was on tube feeds presently off tube feeds presents to the ER for the second time in last 4 days with persistent epigastric abdominal pain with nausea vomiting. Denies any blood in the vomitus. Pain increases when eating food. Denies any diarrhea. Denies chest pain or shortness of breath or productive cough. 4 days ago when patient came to the ER patient's CAT scan abdomen pelvis done showed peripancreatic and pancreatic fluid collection stable with no new changes.  ED Course:In the ER today patient was having persistent pain and labs show normal LFTs with the complete metabolic panel showing anemia which is stable. UA shows features concerning for UTI. Covid test is pending. Given the persistent pain and recent necrotizing pancreatitis patient admitted for further hydration and further work-up.  Discharge Diagnoses:  Principal Problem:   Abdominal pain Active Problems:   Type 2 diabetes mellitus (HCC)   Essential hypertension  #1  Gastritis/esophagitis-  patient admitted with epigastric pain nausea and vomiting.  Patient had recent diagnosis of candidal esophagitis on Diflucan.  She was treated with Protonix and Diflucan.  She was able to  tolerate a regular diet prior to discharge.  She will be discharged today on Protonix twice a day and to finish the course of Diflucan for a total of 20 days.  Recent EGD from 3 weeks ago showed candidal esophagitis and gastritis.  She will follow up with Dr. Barron Alvineirigliano as an outpatient.  Patient has a history of necrotizing pancreatitis and pseudocyst.  MRCP done during this admission shows a pseudocyst of 12.6 cm.   #2 UTI UA consistent with UTI, she received Rocephin.  However urine culture so far shows no growth.  She has got a total of 3 days of Rocephin.    #3 hypertension blood pressure 139/87 Home medications were restarted continue Norvasc, HCTZ, Zestril.  Metoprolol was added for better control of blood pressure.    #4 type 2 diabetes CBG (last 3)  Recent Labs (last 2 labs)        Recent Labs    08/04/19 0416 08/04/19 0726 08/04/19 1200  GLUCAP 181* 183* 212*     Continue Lantus and rest of the home medications.  #5 chronic anemia iron deficient hemoglobin stable at 9.4  #6 history of sleep apnea stable patient needs outpatient follow-up for this.  #7 schizophrenia continue home meds  #8 morbid obesity needs  Ongoing outpatient counseling.  However patient is not at all compliant to any kind of dietary restriction.    Estimated body mass index is 44.6 kg/m as calculated from the following:   Height as of this encounter: 5\' 9"  (1.753 m).   Weight as of this encounter: 137 kg.  Discharge Instructions  Discharge Instructions    Diet - low sodium heart healthy   Complete by: As directed    Increase activity slowly  Complete by: As directed      Allergies as of 08/05/2019   No Known Allergies     Medication List    STOP taking these medications   ondansetron 4 MG disintegrating tablet Commonly known as: Zofran ODT     TAKE these medications   acetaminophen 325 MG tablet Commonly known as: TYLENOL Take 2 tablets (650 mg total) by mouth every 6  (six) hours as needed for mild pain (or Fever >/= 101).   albuterol 108 (90 Base) MCG/ACT inhaler Commonly known as: Ventolin HFA Inhale 1-2 puffs into the lungs every 6 (six) hours as needed for wheezing or shortness of breath.   amitriptyline 50 MG tablet Commonly known as: ELAVIL Take 50 mg by mouth at bedtime.   amLODipine 5 MG tablet Commonly known as: NORVASC Take 5 mg by mouth daily.   clonazePAM 0.5 MG tablet Commonly known as: KLONOPIN Take 0.5 mg by mouth daily as needed for anxiety.   fluconazole 200 MG tablet Commonly known as: DIFLUCAN Take 1 tablet (200 mg total) by mouth daily for 20 days.   lamoTRIgine 200 MG tablet Commonly known as: LAMICTAL Take 200 mg by mouth daily.   Lantus SoloStar 100 UNIT/ML Solostar Pen Generic drug: insulin glargine Inject 15 Units into the skin at bedtime.   lisinopril-hydrochlorothiazide 20-12.5 MG tablet Commonly known as: ZESTORETIC Take 1 tablet by mouth 2 (two) times daily.   lurasidone 40 MG Tabs tablet Commonly known as: LATUDA Take 1 tablet (40 mg total) by mouth at bedtime.   metoprolol tartrate 25 MG tablet Commonly known as: LOPRESSOR Take 0.5 tablets (12.5 mg total) by mouth 2 (two) times daily.   OLANZapine 5 MG tablet Commonly known as: ZYPREXA Take 5 mg by mouth at bedtime.   ondansetron 8 MG tablet Commonly known as: ZOFRAN Take 8 mg by mouth every 8 (eight) hours as needed for nausea or vomiting.   oxyCODONE 5 MG immediate release tablet Commonly known as: Roxicodone Take 1 tablet (5 mg total) by mouth every 4 (four) hours as needed for severe pain.   pantoprazole 40 MG tablet Commonly known as: Protonix Take 1 tablet (40 mg total) by mouth 2 (two) times daily.   senna 8.6 MG Tabs tablet Commonly known as: SENOKOT Take 1 tablet (8.6 mg total) by mouth 2 (two) times daily.   Symbicort 160-4.5 MCG/ACT inhaler Generic drug: budesonide-formoterol Inhale 2 puffs into the lungs 2 (two) times daily  as needed (SOB/wheezing).   traZODone 100 MG tablet Commonly known as: DESYREL Take 100 mg by mouth at bedtime.   Trulicity 4.5 EA/5.4UJ Sopn Generic drug: Dulaglutide Inject 4.5 mg into the skin once a week. Take on Mondays      Follow-up Information    Kristie Cowman, MD Follow up.   Specialty: Family Medicine Contact information: [place] [address] [phone]        Gerrit Heck V, DO Follow up.   Specialty: Gastroenterology Contact information: [address] [place] [address] [phone]          No Known Allergies  Consultations: Lab our GI  Procedures/Studies: CT ABDOMEN PELVIS W CONTRAST  Result Date: 07/29/2019 CLINICAL DATA:  Pancreatitis. EXAM: CT ABDOMEN AND PELVIS WITH CONTRAST TECHNIQUE: Multidetector CT imaging of the abdomen and pelvis was performed using the standard protocol following bolus administration of intravenous contrast. CONTRAST:  188mL OMNIPAQUE IOHEXOL 300 MG/ML  SOLN COMPARISON:  07/17/2019 FINDINGS: Lower chest: There is  atelectasis at the left lung base.The heart size is normal. Hepatobiliary: The liver is normal. Normal gallbladder.There is no biliary ductal dilation. Pancreas: Again noted are large pancreatic fluid collections. The largest measures approximately 12.4 x 7.5 cm (previously measuring 12.2 x 7.9 cm. The collection at the pancreatic tail measures approximately 6.1 x 5.3 cm. No new collections are identified on this exam. Again noted are peripancreatic inflammatory changes similar to prior study. Calcifications are noted at the pancreatic head Spleen: No splenic laceration or hematoma. Adrenals/Urinary Tract: --Adrenal glands: No adrenal hemorrhage. --Right kidney/ureter: Multiple nonobstructing stones are again noted. --Left kidney/ureter: Multiple nonobstructing stones are again noted. --Urinary bladder: Unremarkable. Stomach/Bowel: --Stomach/Duodenum: No hiatal hernia or other gastric  abnormality. Normal duodenal course and caliber. --Small bowel: No dilatation or inflammation. --Colon: There is some wall thickening at the level of the splenic flexure almost certainly reactive from the nearby peripancreatic fluid collections. --Appendix: Surgically absent. Vascular/Lymphatic: Atherosclerotic calcification is present within the non-aneurysmal abdominal aorta, without hemodynamically significant stenosis. The splenic vein is patent but significantly attenuated. The splenic artery appears to be patent but is somewhat difficult to follow distally. --No retroperitoneal lymphadenopathy. --No mesenteric lymphadenopathy. --No pelvic or inguinal lymphadenopathy. Reproductive: Unremarkable Other: No ascites or free air. The abdominal wall is normal. Musculoskeletal. 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 There is severe disc height loss at the L4-L5 level. There is mild disc height loss at the L5-S1 level. IMPRESSION: 1. Again noted are enlarged peripancreatic and intrapancreatic fluid collections as seen on the patient's prior CT. These collections are essentially stable from prior study. 2. The splenic vein appears to be patent but again demonstrates compression from the nearby fluid collections. 3. No evidence for small bowel obstruction. 4. Bilateral nonobstructing nephrolithiasis. 5.  Aortic Atherosclerosis (ICD10-I70.0). Electronically Signed   By: Katherine Mantle M.D.   On: 07/29/2019 21:23   CT ABDOMEN PELVIS W CONTRAST  Addendum Date: 07/17/2019   ADDENDUM REPORT: 07/17/2019 06:59 ADDENDUM: Following further discussion with the ordering provider, the larger pancreatic collection with debris noted in the superior aspect is notably positioned along the greater curvature of the stomach. There is loss of discernible fat plane. It is difficult to ascertain whether there is developing fistulization or direct involvement of the greater curvature  of the stomach though this is certainly a possibility given reported history of 'black' emesis which could reflect either bile, blood or necrosis. Consider urgent GI consultation and consideration of endoscopy. These results were called by telephone at the time of interpretation on 07/17/2019 at 6:59 am to provider [facility] , who verbally acknowledged these results. Electronically Signed   By: Kreg Shropshire M.D.   On: 07/17/2019 06:59   Result Date: 07/17/2019 CLINICAL DATA:  Generalized abdominal pain, recent hospitalization to [facility] for necrotizing pancreatitis. EXAM: CT ABDOMEN AND PELVIS WITH CONTRAST TECHNIQUE: Multidetector CT imaging of the abdomen and pelvis was performed using the standard protocol following bolus administration of intravenous contrast. CONTRAST:  OMNIPAQUE IOHEXOL 300 MG/ML  SOLN COMPARISON:  CT chest 08/04/2018 and CT abdomen pelvis performed 06/21/2019 at outside facility (report only) FINDINGS: Lower chest: Atelectatic changes are present in the lung bases. Normal heart size. No pericardial effusion. Hepatobiliary: No focal liver abnormality is seen. No gallstones, gallbladder wall thickening or pericholecystic inflammation. Prominent fold at the gallbladder neck is similar to comparison. No intra or extrahepatic biliary ductal dilatation. Pancreas: There are extensive hypoattenuating parenchymal replacing collections  involving the pancreatic head body and tail, largest is a multiloculated collection extending from the head to pancreatic body measuring up to 7.9 x 12.2 x 10.4 cm in size with some intermediate attenuation possibly reflecting hemorrhagic debris or proteinaceous material (5/38, 2/48). Additional lobular collection measuring approximately 5.2 x 6.7 x 6.3 cm involving the pancreatic tail. Extensive peripancreatic inflammation is seen. Per outside report dated 06/21/2019 the pancreatic parenchyma previously demonstrated areas of hypoattenuation compatible with  necrotizing pancreatitis. Given time course from prior imaging these collections are most compatible with acute necrotic collections. Nonvisualization of the pancreatic duct slight narrowing of the splenic vein without complete occlusion or thrombus formation. Spleen: Normal in size without focal abnormality. Adrenals/Urinary Tract: Normal adrenal glands. Bilateral diffuse medullary calcification similar to comparison studies. Stable mild bilateral symmetric perinephric stranding, a nonspecific finding though may correlate with either age or decreased renal function. Kidneys are otherwise unremarkable, without renal calculi, suspicious lesion, or hydronephrosis. Bladder is unremarkable. Stomach/Bowel: Transesophageal tube terminates within the proximal jejunum beyond the ligament of Treitz. Distal esophagus, stomach and duodenal sweep are unremarkable. Several loops of small bowel demonstrate some edematous thickening with adjacent inflammatory changes likely arising from the pancreatic process detailed above. No evidence of bowel obstruction. Postsurgical changes at the cecal tip compatible with prior appendectomy. There is segmental thickening of the splenic flexure as it comes in close proximity to the tail of the pancreas. No resulting obstruction. Scattered colonic diverticula without focal pericolonic inflammation to suggest diverticulitis. Vascular/Lymphatic: Atherosclerotic plaque within the normal caliber aorta. Reactive adenopathy in the upper abdomen with edematous retroperitoneal and intraperitoneal changes, as detailed above. Reproductive: Anteverted uterus. No concerning adnexal lesions. Other: Intraperitoneal and retroperitoneal inflammatory changes centered upon the pancreatic process as detailed above. Small amount of inflammatory retroperitoneal fluid is noted. No free air. No bowel containing hernias. Soft tissue nodularity in the low anterior abdomen subcutaneous compatible with injectable use  such as insulin or anticoagulation. Musculoskeletal: Multilevel degenerative changes are present throughout the thoracic and lumbar spine. 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 IMPRESSION: 1. Extensive large intrapancreatic and peripancreatic fluid collections associated with the edematous and necrotic pancreas in the setting of known necrotizing pancreatitis. Given time course from the initial imaging (less than 4 weeks) these collections are most compatible with acute necrotic collection per revised [place] criteria. 2. Slight compression of the splenic vein without demonstrable splenic vein thrombus. 3. Extensive intraperitoneal and retroperitoneal inflammatory changes secondary to the pancreatic inflammation above. Associated reactive thickening of both the small bowel and splenic flexure. No resulting evidence of bowel obstruction. 4. Bilateral medullary nephrocalcinosis, similar to comparison studies. 5. Colonic diverticulosis without evidence of diverticulitis. 6. 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 7. Soft tissue nodularity in the low anterior abdomen subcutaneous tissues compatible with injectable use such as insulin or anticoagulation. 8. Aortic Atherosclerosis (ICD10-I70.0). Electronically Signed: By: Kreg Shropshire M.D. On: 07/17/2019 06:40   MR ABDOMEN MRCP WO CONTRAST  Result Date: 08/03/2019 CLINICAL DATA:  Upper abdominal pain with vomiting, follow-up pancreatic cyst/pseudocyst EXAM: MRI ABDOMEN WITHOUT CONTRAST  (INCLUDING MRCP) TECHNIQUE: Multiplanar multisequence MR imaging of the abdomen was performed. Heavily T2-weighted images of the biliary and pancreatic ducts were obtained, and three-dimensional MRCP images were rendered by post processing. COMPARISON:  CT abdomen/pelvis dated 07/29/2019 FINDINGS: Lower  chest: Lung bases are clear. Hepatobiliary: Liver is within normal  limits. No focal hepatic lesions. No morphologic findings of cirrhosis. No hepatic steatosis. Gallbladder is unremarkable. No intrahepatic or extrahepatic ductal dilatation. Common duct measures 5 mm. No choledocholithiasis is seen. Pancreas: Again noted are peripancreatic fluid/inflammatory changes, predominantly along the pancreatic tail. Two dominant pancreatic fluid collection/pseudocysts, including a bilobed collection measuring 7.9 x 12.6 cm along the pancreatic body/tail (series 16/image 26) and a 4.9 x 6.0 cm collection along the pancreatic tail adjacent to the splenic flexure of colon (series 16/image 30). These are grossly unchanged from recent CT, measuring 7.5 x 12.4 cm and 5.3 x 6.1 cm respectively. Additional 2.1 x 2.5 cm fluid collection in the left anterior abdomen is unchanged. Known parenchymal calcifications in the pancreatic head/uncinate process are not well visualized on the current study. Spleen:  Within normal limits. Adrenals/Urinary Tract:  Adrenal glands are within normal limits. Kidneys are within normal limits. Known nonobstructing calculi are not evident on MR. No hydronephrosis. Stomach/Bowel: Stomach is within normal limits. Visualized bowel is grossly unremarkable. Vascular/Lymphatic:  No evidence of abdominal aortic aneurysm. No suspicious abdominal lymphadenopathy. Other:  No abdominal ascites. Musculoskeletal: No focal osseous lesions. IMPRESSION: Sequela of prior/chronic pancreatitis, including a dominant 12.6 cm fluid collection/pseudocyst along the pancreatic body/tail, unchanged from recent CT. Mild peripancreatic inflammatory changes/fluid. No cholelithiasis or choledocholithiasis is evident on MR. Electronically Signed   By: Charline Bills M.D.   On: 08/03/2019 11:52   MR 3D Recon At Scanner  Result Date: 08/03/2019 CLINICAL DATA:  Upper abdominal pain with vomiting, follow-up pancreatic  cyst/pseudocyst EXAM: MRI ABDOMEN WITHOUT CONTRAST  (INCLUDING MRCP) TECHNIQUE: Multiplanar multisequence MR imaging of the abdomen was performed. Heavily T2-weighted images of the biliary and pancreatic ducts were obtained, and three-dimensional MRCP images were rendered by post processing. COMPARISON:  CT abdomen/pelvis dated 07/29/2019 FINDINGS: Lower chest: Lung bases are clear. Hepatobiliary: Liver is within normal limits. No focal hepatic lesions. No morphologic findings of cirrhosis. No hepatic steatosis. Gallbladder is unremarkable. No intrahepatic or extrahepatic ductal dilatation. Common duct measures 5 mm. No choledocholithiasis is seen. Pancreas: Again noted are peripancreatic fluid/inflammatory changes, predominantly along the pancreatic tail. Two dominant pancreatic fluid collection/pseudocysts, including a bilobed collection measuring 7.9 x 12.6 cm along the pancreatic body/tail (series 16/image 26) and a 4.9 x 6.0 cm collection along the pancreatic tail adjacent to the splenic flexure of colon (series 16/image 30). These are grossly unchanged from recent CT, measuring 7.5 x 12.4 cm and 5.3 x 6.1 cm respectively. Additional 2.1 x 2.5 cm fluid collection in the left anterior abdomen is unchanged. Known parenchymal calcifications in the pancreatic head/uncinate process are not well visualized on the current study. Spleen:  Within normal limits. Adrenals/Urinary Tract:  Adrenal glands are within normal limits. Kidneys are within normal limits. Known nonobstructing calculi are not evident on MR. No hydronephrosis. Stomach/Bowel: Stomach is within normal limits. Visualized bowel is grossly unremarkable. Vascular/Lymphatic:  No evidence of abdominal aortic aneurysm. No suspicious abdominal lymphadenopathy. Other:  No abdominal ascites. Musculoskeletal: No focal osseous lesions. IMPRESSION: Sequela of prior/chronic pancreatitis, including a dominant 12.6 cm fluid collection/pseudocyst along the pancreatic  body/tail, unchanged from recent CT. Mild peripancreatic inflammatory changes/fluid. No cholelithiasis or choledocholithiasis is evident on MR. Electronically Signed   By: Charline Bills M.D.   On: 08/03/2019 11:52    (Echo, Carotid, EGD, Colonoscopy, ERCP)    Subjective: Patient resting in bed she tolerated a diet last night she ate grilled chicken and mashed potatoes and kept it down denied any vomiting continues to  complain of abdominal pain.  Discharge Exam: Vitals:   08/05/19 0554 08/05/19 0813  BP: (!) 148/94   Pulse: 98 (!) 104  Resp: 16 17  Temp: 98.3 F (36.8 C)   SpO2: 94% 98%   Vitals:   08/04/19 2032 08/04/19 2148 08/05/19 0554 08/05/19 0813  BP:  (!) 143/83 (!) 148/94   Pulse:  (!) 103 98 (!) 104  Resp:   16 17  Temp:   98.3 F (36.8 C)   TempSrc:   Oral   SpO2:   94% 98%  Weight: (!) 140.3 kg  (!) 137 kg   Height:        General: Pt is alert, awake, not in acute distress Cardiovascular: RRR, S1/S2 +, no rubs, no gallops Respiratory: CTA bilaterally, no wheezing, no rhonchi Abdominal: Soft, epigastric tenderness, ND, bowel sounds + Extremities: no edema, no cyanosis    The results of significant diagnostics from this hospitalization (including imaging, microbiology, ancillary and laboratory) are listed below for reference.     Microbiology: Recent Results (from the past 240 hour(s))  SARS CORONAVIRUS 2 (TAT 6-24 HRS) Nasopharyngeal Nasopharyngeal Swab     Status: None   Collection Time: 08/03/19  5:10 AM   Specimen: Nasopharyngeal Swab  Result Value Ref Range Status   SARS Coronavirus 2 NEGATIVE NEGATIVE Final    Comment: (NOTE) SARS-CoV-2 target nucleic acids are NOT DETECTED. The SARS-CoV-2 RNA is generally detectable in upper and lower respiratory specimens during the acute phase of infection. Negative results do not preclude SARS-CoV-2 infection, do not rule out co-infections with other pathogens, and should not be used as the sole basis for  treatment or other patient management decisions. Negative results must be combined with clinical observations, patient history, and epidemiological information. The expected result is Negative. Fact Sheet for Patients: HairSlick.no Fact Sheet for Healthcare Providers: quierodirigir.com This test is not yet approved or cleared by the [place] FDA and  has been authorized for detection and/or diagnosis of SARS-CoV-2 by FDA under an Emergency Use Authorization (EUA). This EUA will remain  in effect (meaning this test can be used) for the duration of the COVID-19 declaration under Section 56 4(b)(1) of the Act, 21 U.S.C. section 360bbb-3(b)(1), unless the authorization is terminated or revoked sooner. Performed at [facility], [address], [place], [place] [address]      Labs: BNP (last 3 results) No results for input(s): BNP in the last 8760 hours. Basic Metabolic Panel: Recent Labs  Lab 07/29/19 1724 08/02/19 2045 08/03/19 0509 08/04/19 0549  NA 131* 137 138 139  K 4.0 3.5 3.7 3.5  CL 97* 100 104 101  CO2 23 26 26 27   GLUCOSE 361* 253* 246* 202*  BUN 9 8 8 7   CREATININE 0.98 0.82 0.72 0.68  CALCIUM 9.1 9.0 8.9 9.0  MG  --   --   --  1.6*   Liver Function Tests: Recent Labs  Lab 07/29/19 1724 08/02/19 2045 08/03/19 0509 08/04/19 0549  AST 23 23 21 21   ALT 16 15 13 15   ALKPHOS 89 76 66 67  BILITOT 0.5 0.4 0.5 0.6  PROT 7.3 7.2 6.6 6.3*  ALBUMIN 3.8 3.7 3.4* 3.3*   Recent Labs  Lab 07/29/19 1724 08/02/19 2045  LIPASE 50 39   No results for input(s): AMMONIA in the last 168 hours. CBC: Recent Labs  Lab 07/29/19 1724 08/02/19 2045 08/03/19 0509 08/04/19 0549  WBC 6.6 5.7 5.4 5.0  HGB 10.4* 10.2*  9.3* 9.4*  HCT 36.3 35.4* 33.1* 32.6*  MCV 77.9* 78.7* 79.2* 78.2*  PLT 315 268 222 231   Cardiac Enzymes: No results for input(s): CKTOTAL, CKMB, CKMBINDEX, TROPONINI in the last 168  hours. BNP: Invalid input(s): POCBNP CBG: Recent Labs  Lab 08/04/19 0416 08/04/19 0726 08/04/19 1200 08/04/19 1643 08/05/19 0726  GLUCAP 181* 183* 212* 222* 224*   D-Dimer No results for input(s): DDIMER in the last 72 hours. Hgb A1c No results for input(s): HGBA1C in the last 72 hours. Lipid Profile No results for input(s): CHOL, HDL, LDLCALC, TRIG, CHOLHDL, LDLDIRECT in the last 72 hours. Thyroid function studies No results for input(s): TSH, T4TOTAL, T3FREE, THYROIDAB in the last 72 hours.  Invalid input(s): FREET3 Anemia work up No results for input(s): VITAMINB12, FOLATE, FERRITIN, TIBC, IRON, RETICCTPCT in the last 72 hours. Urinalysis    Component Value Date/Time   COLORURINE YELLOW 08/02/2019 2044   APPEARANCEUR CLOUDY (A) 08/02/2019 2044   LABSPEC 1.011 08/02/2019 2044   PHURINE 6.0 08/02/2019 2044   GLUCOSEU NEGATIVE 08/02/2019 2044   HGBUR SMALL (A) 08/02/2019 2044   BILIRUBINUR NEGATIVE 08/02/2019 2044   KETONESUR NEGATIVE 08/02/2019 2044   PROTEINUR 100 (A) 08/02/2019 2044   UROBILINOGEN 0.2 02/12/2015 1348   NITRITE NEGATIVE 08/02/2019 2044   LEUKOCYTESUR LARGE (A) 08/02/2019 2044   Sepsis Labs Invalid input(s): PROCALCITONIN,  WBC,  LACTICIDVEN Microbiology Recent Results (from the past 240 hour(s))  SARS CORONAVIRUS 2 (TAT 6-24 HRS) Nasopharyngeal Nasopharyngeal Swab     Status: None   Collection Time: 08/03/19  5:10 AM   Specimen: Nasopharyngeal Swab  Result Value Ref Range Status   SARS Coronavirus 2 NEGATIVE NEGATIVE Final    Comment: (NOTE) SARS-CoV-2 target nucleic acids are NOT DETECTED. The SARS-CoV-2 RNA is generally detectable in upper and lower respiratory specimens during the acute phase of infection. Negative results do not preclude SARS-CoV-2 infection, do not rule out co-infections with other pathogens, and should not be used as the sole basis for treatment or other patient management decisions. Negative results must be combined  with clinical observations, patient history, and epidemiological information. The expected result is Negative. Fact Sheet for Patients: HairSlick.no Fact Sheet for Healthcare Providers: quierodirigir.com This test is not yet approved or cleared by the [place] FDA and  has been authorized for detection and/or diagnosis of SARS-CoV-2 by FDA under an Emergency Use Authorization (EUA). This EUA will remain  in effect (meaning this test can be used) for the duration of the COVID-19 declaration under Section 56 4(b)(1) of the Act, 21 U.S.C. section 360bbb-3(b)(1), unless the authorization is terminated or revoked sooner. Performed at [facility], [address], [place], [place] [address]      Time coordinating discharge:  39 minutes  SIGNED:   Alwyn Ren, MD  [facility] 08/05/2019, 9:00 AM Pager   If 7PM-7AM, please contact night-coverage [email or web address] Password [email or web address]

## 2019-08-05 NOTE — Progress Notes (Signed)
Initial Nutrition Assessment  DOCUMENTATION CODES:   Morbid obesity  INTERVENTION:  - will order ONS if patient is unable to d/c today.    NUTRITION DIAGNOSIS:   Inadequate oral intake related to acute illness, nausea, vomiting as evidenced by per patient/family report, meal completion < 50%  GOAL:   Patient will meet greater than or equal to 90% of their needs  MONITOR:   PO intake, Labs, Weight trends  REASON FOR ASSESSMENT:   Malnutrition Screening Tool  ASSESSMENT:   54 y.o. female with medical history of type 2 DM and HTN. She was recently admitted for hematemesis and underwent EGD which showed candida esophagitis. She had been admitted to [facility] for necrotizing pancreatitis with hypercalcemia and was on tube feeding during that hospitalization. Not on TF after d/c. She presented to the ED for the second time in 4 days due to persistent epigastric pain with associated N/V. Pain is exacerbated by intake of food. When she presented to the ED 4 days ago, CAT scan of abdomen/pelvis showed peripancreatic and pancreatic fluid.  She did not eat breakfast this AM. Patient was last seen by a RD on 2/26. At that time, patient was eating 100% of meals, did not have any N/V or abdominal pain associated with eating. Nasojejunal tube placed prior to last admission and was removed during the time of last admission. She does not have any enteral nutrition route at this time.   She states that for the past week she has had abdominal pain and nausea with intermittent vomiting with all PO intakes. She has not had any episodes of emesis since admission on 3/13.  She is s/p MRCP which showed 12.6 cm pseudocyst. She was dx with gastritis/esophagitis. UA indicated UTI.   Discharge order and discharge summary entered early this AM with plan for patient to discharge home. She is currently ordered a Heart Healthy diet, but MD notes indicate that patient has not been compliant with diet restrictions at  home in the past.    Labs reviewed; CBG: 224 mg/dl, Mg: 1.6 mg/dl. Medications reviewed; 200 mg oral diflucan/day, sliding scale novolog, 5 units lantus/day, 40 mg oral protonix BID, 1 tablet senokot BID, 1 g carafate TID.      NUTRITION - FOCUSED PHYSICAL EXAM:    Most Recent Value  Orbital Region  Mild depletion  Upper Arm Region  Mild depletion  Thoracic and Lumbar Region  Unable to assess  Buccal Region  Mild depletion  Temple Region  No depletion  Clavicle Bone Region  Mild depletion  Clavicle and Acromion Bone Region  No depletion  Scapular Bone Region  No depletion  Dorsal Hand  No depletion  Patellar Region  No depletion  Anterior Thigh Region  Unable to assess  Posterior Calf Region  No depletion  Edema (RD Assessment)  Mild [BLE]  Hair  Reviewed  Eyes  Reviewed  Mouth  Reviewed  Skin  Reviewed  Nails  Reviewed       Diet Order:   Diet Order            Diet - low sodium heart healthy        Diet Heart Room service appropriate? Yes; Fluid consistency: Thin  Diet effective now              EDUCATION NEEDS:   Not appropriate for education at this time  Skin:  Skin Assessment: Reviewed RN Assessment  Last BM:  3/10 (PTA)  Height:   Ht Readings  from Last 1 Encounters:  08/03/19 5\' 9"  (1.753 m)    Weight:   Wt Readings from Last 1 Encounters:  08/05/19 (!) 137 kg    Ideal Body Weight:  65.9 kg  BMI:  Body mass index is 44.6 kg/m.  Estimated Nutritional Needs:   Kcal:  2637-8588 kcal  Protein:  105-120 grams  Fluid:  >/= 2 L/day     Jarome Matin, MS, RD, LDN, CNSC Inpatient Clinical Dietitian RD pager # available in AMION  After hours/weekend pager # available in [facility]

## 2019-08-05 NOTE — Discharge Instructions (Signed)
Abdominal Pain, Adult Many things can cause belly (abdominal) pain. Most times, belly pain is not dangerous. Many cases of belly pain can be watched and treated at home. Sometimes, though, belly pain is serious. Your doctor will try to find the cause of your belly pain. Follow these instructions at home:  Medicines  Take over-the-counter and prescription medicines only as told by your doctor.  Do not take medicines that help you poop (laxatives) unless told by your doctor. General instructions  Watch your belly pain for any changes.  Drink enough fluid to keep your pee (urine) pale yellow.  Keep all follow-up visits as told by your doctor. This is important. Contact a doctor if:  Your belly pain changes or gets worse.  You are not hungry, or you lose weight without trying.  You are having trouble pooping (constipated) or have watery poop (diarrhea) for more than 2-3 days.  You have pain when you pee or poop.  Your belly pain wakes you up at night.  Your pain gets worse with meals, after eating, or with certain foods.  You are vomiting and cannot keep anything down.  You have a fever.  You have blood in your pee. Get help right away if:  Your pain does not go away as soon as your doctor says it should.  You cannot stop vomiting.  Your pain is only in areas of your belly, such as the right side or the left lower part of the belly.  You have bloody or black poop, or poop that looks like tar.  You have very bad pain, cramping, or bloating in your belly.  You have signs of not having enough fluid or water in your body (dehydration), such as: ? Dark pee, very little pee, or no pee. ? Cracked lips. ? Dry mouth. ? Sunken eyes. ? Sleepiness. ? Weakness.  You have trouble breathing or chest pain. Summary  Many cases of belly pain can be watched and treated at home.  Watch your belly pain for any changes.  Take over-the-counter and prescription medicines only as  told by your doctor.  Contact a doctor if your belly pain changes or gets worse.  Get help right away if you have very bad pain, cramping, or bloating in your belly. This information is not intended to replace advice given to you by your health care provider. Make sure you discuss any questions you have with your health care provider. Document Revised: 09/17/2018 Document Reviewed: 09/17/2018 Elsevier Patient Education  2020 Elsevier Inc.   Acute Pancreatitis  Acute pancreatitis happens when the pancreas gets swollen. The pancreas is a large gland in the body that helps to control blood sugar. It also makes enzymes that help to digest food. This condition can last a few days and cause serious problems. The lungs, heart, and kidneys may stop working. What are the causes? Causes include:  Alcohol abuse.  Drug abuse.  Gallstones.  A tumor in the pancreas. Other causes include:  Some medicines.  Some chemicals.  Diabetes.  An infection.  Damage caused by an accident.  The poison (venom) from a scorpion bite.  Belly (abdominal) surgery.  The body's defense system (immune system) attacking the pancreas (autoimmune pancreatitis).  Genes that are passed from parent to child (inherited). In some cases, the cause is not known. What are the signs or symptoms?  Pain in the upper belly that may be felt in the back. The pain may be very bad.  Swelling of the  belly.  Feeling sick to your stomach (nauseous) and throwing up (vomiting).  Fever. How is this treated? You will likely have to stay in the hospital. Treatment may include:  Pain medicine.  Fluid through an IV tube.  Placing a tube in the stomach to take out the stomach contents. This may help you stop throwing up.  Not eating for 3-4 days.  Antibiotic medicines, if you have an infection.  Treating any other problems that may be the cause.  Steroid medicines, if your problem is caused by your defense system  attacking your body's own tissues.  Surgery. Follow these instructions at home: Eating and drinking   Follow instructions from your doctor about what to eat and drink.  Eat foods that do not have a lot of fat in them.  Eat small meals often. Do not eat big meals.  Drink enough fluid to keep your pee (urine) pale yellow.  Do not drink alcohol if it caused your condition. Medicines  Take over-the-counter and prescription medicines only as told by your doctor.  Ask your doctor if the medicine prescribed to you: ? Requires you to avoid driving or using heavy machinery. ? Can cause trouble pooping (constipation). You may need to take steps to prevent or treat trouble pooping:  Take over-the-counter or prescription medicines.  Eat foods that are high in fiber. These include beans, whole grains, and fresh fruits and vegetables.  Limit foods that are high in fat and sugar. These include fried or sweet foods. General instructions  Do not use any products that contain nicotine or tobacco, such as cigarettes, e-cigarettes, and chewing tobacco. If you need help quitting, ask your doctor.  Get plenty of rest.  Check your blood sugar at home as told by your doctor.  Keep all follow-up visits as told by your doctor. This is important. Contact a doctor if:  You do not get better as quickly as expected.  You have new symptoms.  Your symptoms get worse.  You have pain or weakness that lasts a long time.  You keep feeling sick to your stomach.  You get better and then you have pain again.  You have a fever. Get help right away if:  You cannot eat or keep fluids down.  Your pain gets very bad.  Your skin or the white part of your eyes turns yellow.  You have sudden swelling in your belly.  You throw up.  You feel dizzy or you pass out (faint).  Your blood sugar is high (over 300 mg/dL). Summary  Acute pancreatitis happens when the pancreas gets swollen.  This  condition is often caused by alcohol abuse, drug abuse, or gallstones.  You will likely have to stay in the hospital for treatment. This information is not intended to replace advice given to you by your health care provider. Make sure you discuss any questions you have with your health care provider. Document Revised: 02/26/2018 Document Reviewed: 02/26/2018 Elsevier Patient Education  2020 [employer].   Acute Pancreatitis  The pancreas is a gland that is located behind the stomach on the left side of the abdomen. It produces enzymes that help to digest food. The pancreas also releases the hormones glucagon and insulin, which help to regulate blood sugar. Acute pancreatitis happens when inflammation of the pancreas suddenly occurs and the pancreas becomes irritated and swollen. Most acute attacks last a few days and cause serious problems. Some people become dehydrated and develop low blood pressure. In severe cases,  bleeding in the abdomen can lead to shock and can be life-threatening. The lungs, heart, and kidneys may fail. What are the causes? This condition may be caused by:  Alcohol abuse.  Drug abuse.  Gallstones or other conditions that can block the tube that drains the pancreas (pancreatic duct).  A tumor in the pancreas. Other causes include:  Certain medicines.  Exposure to certain chemicals.  Diabetes.  An infection in the pancreas.  Damage caused by an accident (trauma).  The poison (venom) from a scorpion bite.  Abdominal surgery.  Autoimmune pancreatitis. This is when the body's disease-fighting (immune) system attacks the pancreas.  Genes that are passed from parent to child (inherited). In some cases, the cause of this condition is not known. What are the signs or symptoms? Symptoms of this condition include:  Pain in the upper abdomen that may radiate to the back. Pain may be severe.  Tenderness and swelling of the abdomen.  Nausea and  vomiting.  Fever. How is this diagnosed? This condition may be diagnosed based on:  A physical exam.  Blood tests.  Imaging tests, such as X-rays, CT or MRI scans, or an ultrasound of the abdomen. How is this treated? Treatment for this condition usually requires a stay in the hospital. Treatment for this condition may include:  Pain medicine.  Fluid replacement through an IV.  Placing a tube in the stomach to remove stomach contents and to control vomiting (NG tube, or nasogastric tube).  Not eating for 3-4 days. This gives the pancreas a rest, because enzymes are not being produced that can cause further damage.  Antibiotic medicines, if your condition is caused by an infection.  Treating any underlying conditions that may be the cause.  Steroid medicines, if your condition is caused by your immune system attacking your body's own tissues (autoimmune disease).  Surgery on the pancreas or gallbladder. Follow these instructions at home: Eating and drinking   Follow instructions from your health care provider about diet. This may involve avoiding alcohol and decreasing the amount of fat in your diet.  Eat smaller, more frequent meals. This reduces the amount of digestive fluids that the pancreas produces.  Drink enough fluid to keep your urine pale yellow.  Do not drink alcohol if it caused your condition. General instructions  Take over-the-counter and prescription medicines only as told by your health care provider.  Do not drive or use heavy machinery while taking prescription pain medicine.  Ask your health care provider if the medicine prescribed to you can cause constipation. You may need to take steps to prevent or treat constipation, such as: ? Take an over-the-counter or prescription medicine for constipation. ? Eat foods that are high in fiber such as whole grains and beans. ? Limit foods that are high in fat and processed sugars, such as fried or sweet  foods.  Do not use any products that contain nicotine or tobacco, such as cigarettes, e-cigarettes, and chewing tobacco. If you need help quitting, ask your health care provider.  Get plenty of rest.  If directed, check your blood sugar at home as told by your health care provider.  Keep all follow-up visits as told by your health care provider. This is important. Contact a health care provider if you:  Do not recover as quickly as expected.  Develop new or worsening symptoms.  Have persistent pain, weakness, or nausea.  Recover and then have another episode of pain.  Have a fever. Get help right  away if:  You cannot eat or keep fluids down.  Your pain becomes severe.  Your skin or the white part of your eyes turns yellow (jaundice).  You have sudden swelling in your abdomen.  You vomit.  You feel dizzy or you faint.  Your blood sugar is high (over 300 mg/dL). Summary  Acute pancreatitis happens when inflammation of the pancreas suddenly occurs and the pancreas becomes irritated and swollen.  This condition is typically caused by alcohol abuse, drug abuse, or gallstones.  Treatment for this condition usually requires a stay in the hospital. This information is not intended to replace advice given to you by your health care provider. Make sure you discuss any questions you have with your health care provider. Document Revised: 02/26/2018 Document Reviewed: 11/13/2017 Elsevier Patient Education  [place].

## 2019-08-06 LAB — GLUCOSE, CAPILLARY
Glucose-Capillary: 234 mg/dL — ABNORMAL HIGH (ref 70–99)
Glucose-Capillary: 220 mg/dL — ABNORMAL HIGH (ref 70–99)

## 2019-08-06 MED ORDER — LACTULOSE 10 GM/15ML PO SOLN
20.0000 g | Freq: Once | ORAL | Status: AC
  Administered 2019-08-06: 20 g via ORAL
  Filled 2019-08-06: qty 30

## 2019-08-06 MED ORDER — LACTULOSE 10 GM/15ML PO SOLN: 20.0000 g | Freq: Every day | ORAL | 0 refills | Status: DC | PRN

## 2019-08-06 NOTE — Care Management Important Message (Signed)
Important Message  Patient Details IM Letter given to Daryel Gerald SW Case Manager to present to the Patient Name: Monique Holland MRN: [id number] Date of Birth: March 12, 1966   Medicare Important Message Given:  Yes     Caren Macadam 08/06/2019, 10:31 AM

## 2019-08-07 LAB — URINE CULTURE: Culture: >=100000 — AB

## 2019-08-16 ENCOUNTER — Emergency Department (HOSPITAL_COMMUNITY): Payer: 59

## 2019-08-16 ENCOUNTER — Encounter (HOSPITAL_COMMUNITY): Payer: Self-pay

## 2019-08-16 ENCOUNTER — Other Ambulatory Visit: Payer: Self-pay

## 2019-08-16 ENCOUNTER — Inpatient Hospital Stay (HOSPITAL_COMMUNITY)
Admission: EM | Admit: 2019-08-16 | Discharge: 2019-08-27 | DRG: 439 | Disposition: A | Discharge status: Home / Self Care | Payer: 59 | Attending: Internal Medicine | Admitting: Internal Medicine

## 2019-08-16 DIAGNOSIS — I10 Essential (primary) hypertension: Secondary | ICD-10-CM | POA: Diagnosis present

## 2019-08-16 DIAGNOSIS — N179 Acute kidney failure, unspecified: Secondary | ICD-10-CM

## 2019-08-16 DIAGNOSIS — G8929 Other chronic pain: Secondary | ICD-10-CM | POA: Diagnosis present

## 2019-08-16 DIAGNOSIS — E1165 Type 2 diabetes mellitus with hyperglycemia: Secondary | ICD-10-CM | POA: Diagnosis present

## 2019-08-16 DIAGNOSIS — E1169 Type 2 diabetes mellitus with other specified complication: Secondary | ICD-10-CM

## 2019-08-16 DIAGNOSIS — Z8719 Personal history of other diseases of the digestive system: Secondary | ICD-10-CM

## 2019-08-16 DIAGNOSIS — K8500 Idiopathic acute pancreatitis without necrosis or infection: Secondary | ICD-10-CM

## 2019-08-16 DIAGNOSIS — K8681 Exocrine pancreatic insufficiency: Secondary | ICD-10-CM | POA: Diagnosis present

## 2019-08-16 DIAGNOSIS — K8591 Acute pancreatitis with uninfected necrosis, unspecified: Secondary | ICD-10-CM

## 2019-08-16 DIAGNOSIS — Z87442 Personal history of urinary calculi: Secondary | ICD-10-CM

## 2019-08-16 DIAGNOSIS — K219 Gastro-esophageal reflux disease without esophagitis: Secondary | ICD-10-CM | POA: Diagnosis present

## 2019-08-16 DIAGNOSIS — D509 Iron deficiency anemia, unspecified: Secondary | ICD-10-CM | POA: Diagnosis present

## 2019-08-16 DIAGNOSIS — E119 Type 2 diabetes mellitus without complications: Secondary | ICD-10-CM

## 2019-08-16 DIAGNOSIS — K2900 Acute gastritis without bleeding: Secondary | ICD-10-CM

## 2019-08-16 DIAGNOSIS — K3189 Other diseases of stomach and duodenum: Secondary | ICD-10-CM | POA: Diagnosis present

## 2019-08-16 DIAGNOSIS — R1110 Vomiting, unspecified: Secondary | ICD-10-CM

## 2019-08-16 DIAGNOSIS — Z794 Long term (current) use of insulin: Secondary | ICD-10-CM

## 2019-08-16 DIAGNOSIS — Z7951 Long term (current) use of inhaled steroids: Secondary | ICD-10-CM

## 2019-08-16 DIAGNOSIS — R739 Hyperglycemia, unspecified: Secondary | ICD-10-CM

## 2019-08-16 DIAGNOSIS — E876 Hypokalemia: Secondary | ICD-10-CM | POA: Diagnosis present

## 2019-08-16 DIAGNOSIS — K863 Pseudocyst of pancreas: Principal | ICD-10-CM | POA: Diagnosis present

## 2019-08-16 DIAGNOSIS — Z20822 Contact with and (suspected) exposure to covid-19: Secondary | ICD-10-CM | POA: Diagnosis present

## 2019-08-16 DIAGNOSIS — E861 Hypovolemia: Secondary | ICD-10-CM | POA: Diagnosis present

## 2019-08-16 DIAGNOSIS — R109 Unspecified abdominal pain: Secondary | ICD-10-CM | POA: Diagnosis not present

## 2019-08-16 DIAGNOSIS — K861 Other chronic pancreatitis: Secondary | ICD-10-CM | POA: Diagnosis present

## 2019-08-16 DIAGNOSIS — Z9851 Tubal ligation status: Secondary | ICD-10-CM

## 2019-08-16 DIAGNOSIS — J45909 Unspecified asthma, uncomplicated: Secondary | ICD-10-CM | POA: Diagnosis present

## 2019-08-16 DIAGNOSIS — G4733 Obstructive sleep apnea (adult) (pediatric): Secondary | ICD-10-CM | POA: Diagnosis present

## 2019-08-16 DIAGNOSIS — K920 Hematemesis: Secondary | ICD-10-CM | POA: Diagnosis present

## 2019-08-16 DIAGNOSIS — F209 Schizophrenia, unspecified: Secondary | ICD-10-CM | POA: Diagnosis present

## 2019-08-16 DIAGNOSIS — K862 Cyst of pancreas: Secondary | ICD-10-CM | POA: Diagnosis present

## 2019-08-16 DIAGNOSIS — Z6841 Body Mass Index (BMI) 40.0 and over, adult: Secondary | ICD-10-CM

## 2019-08-16 DIAGNOSIS — F329 Major depressive disorder, single episode, unspecified: Secondary | ICD-10-CM | POA: Diagnosis present

## 2019-08-16 DIAGNOSIS — K859 Acute pancreatitis without necrosis or infection, unspecified: Secondary | ICD-10-CM | POA: Diagnosis present

## 2019-08-16 DIAGNOSIS — F419 Anxiety disorder, unspecified: Secondary | ICD-10-CM | POA: Diagnosis present

## 2019-08-16 DIAGNOSIS — R599 Enlarged lymph nodes, unspecified: Secondary | ICD-10-CM | POA: Diagnosis present

## 2019-08-16 DIAGNOSIS — R112 Nausea with vomiting, unspecified: Secondary | ICD-10-CM

## 2019-08-16 DIAGNOSIS — R1010 Upper abdominal pain, unspecified: Secondary | ICD-10-CM

## 2019-08-16 DIAGNOSIS — I82890 Acute embolism and thrombosis of other specified veins: Secondary | ICD-10-CM | POA: Diagnosis present

## 2019-08-16 DIAGNOSIS — B9561 Methicillin susceptible Staphylococcus aureus infection as the cause of diseases classified elsewhere: Secondary | ICD-10-CM | POA: Diagnosis present

## 2019-08-16 DIAGNOSIS — E860 Dehydration: Secondary | ICD-10-CM | POA: Diagnosis present

## 2019-08-16 DIAGNOSIS — Z79899 Other long term (current) drug therapy: Secondary | ICD-10-CM

## 2019-08-16 DIAGNOSIS — B3781 Candidal esophagitis: Secondary | ICD-10-CM | POA: Diagnosis not present

## 2019-08-16 LAB — URINALYSIS, ROUTINE W REFLEX MICROSCOPIC
Bacteria, UA: NONE SEEN
Bilirubin Urine: NEGATIVE
Glucose, UA: >=500 mg/dL — AB
Hgb urine dipstick: NEGATIVE
Ketones, ur: NEGATIVE mg/dL
Leukocytes,Ua: NEGATIVE
Nitrite: NEGATIVE
Protein, ur: NEGATIVE mg/dL
Specific Gravity, Urine: 1.013 (ref 1.005–1.030)
pH: 7 (ref 5.0–8.0)

## 2019-08-16 LAB — CBC WITH DIFFERENTIAL/PLATELET
Abs Immature Granulocytes: 0.03 10*3/uL (ref 0.00–0.07)
Basophils Absolute: 0.1 10*3/uL (ref 0.0–0.1)
Basophils Relative: 1 %
Eosinophils Absolute: 0.2 10*3/uL (ref 0.0–0.5)
Eosinophils Relative: 3 %
HCT: 36 % (ref 36.0–46.0)
Hemoglobin: 10.5 g/dL — ABNORMAL LOW (ref 12.0–15.0)
Immature Granulocytes: 0 %
Lymphocytes Relative: 35 %
Lymphs Abs: 2.8 10*3/uL (ref 0.7–4.0)
MCH: 23 pg — ABNORMAL LOW (ref 26.0–34.0)
MCHC: 29.2 g/dL — ABNORMAL LOW (ref 30.0–36.0)
MCV: 78.9 fL — ABNORMAL LOW (ref 80.0–100.0)
Monocytes Absolute: 0.7 10*3/uL (ref 0.1–1.0)
Monocytes Relative: 9 %
Neutro Abs: 4.1 10*3/uL (ref 1.7–7.7)
Neutrophils Relative %: 52 %
Platelets: 297 10*3/uL (ref 150–400)
RBC: 4.56 MIL/uL (ref 3.87–5.11)
RDW: 15.3 % (ref 11.5–15.5)
WBC: 7.9 10*3/uL (ref 4.0–10.5)
nRBC: 0 % (ref 0.0–0.2)

## 2019-08-16 LAB — COMPREHENSIVE METABOLIC PANEL
ALT: 25 U/L (ref 0–44)
AST: 34 U/L (ref 15–41)
Albumin: 3.7 g/dL (ref 3.5–5.0)
Alkaline Phosphatase: 77 U/L (ref 38–126)
Anion gap: 12 (ref 5–15)
BUN: 11 mg/dL (ref 6–20)
CO2: 24 mmol/L (ref 22–32)
Calcium: 9.3 mg/dL (ref 8.9–10.3)
Chloride: 98 mmol/L (ref 98–111)
Creatinine, Ser: 1.1 mg/dL — ABNORMAL HIGH (ref 0.44–1.00)
GFR calc Af Amer: >60 mL/min (ref >60)
GFR calc non Af Amer: 58 mL/min — ABNORMAL LOW (ref >60)
Glucose, Bld: 403 mg/dL — ABNORMAL HIGH (ref 70–99)
Potassium: 3.6 mmol/L (ref 3.5–5.1)
Sodium: 134 mmol/L — ABNORMAL LOW (ref 135–145)
Total Bilirubin: 0.7 mg/dL (ref 0.3–1.2)
Total Protein: 7 g/dL (ref 6.5–8.1)

## 2019-08-16 LAB — POC OCCULT BLOOD, ED: Fecal Occult Bld: NEGATIVE

## 2019-08-16 LAB — CBG MONITORING, ED: Glucose-Capillary: 315 mg/dL — ABNORMAL HIGH (ref 70–99)

## 2019-08-16 LAB — LIPASE, BLOOD: Lipase: 35 U/L (ref 11–51)

## 2019-08-16 IMAGING — CT CT ABD-PELV W/ CM
2 of 5 series · 15 of 46 positions shown, 17 images · IV contrast (APPLIED)
Comparison: Most recent CT [DATE].

CLINICAL DATA: Recurrent left upper quadrant abdominal pain. Nausea
and vomiting. Recent diagnosis of necrotizing pancreatitis.

EXAM:
CT ABDOMEN AND PELVIS WITH CONTRAST
TECHNIQUE: Multidetector CT imaging of the abdomen and pelvis was performed
using the standard protocol following bolus administration of
intravenous contrast.
CONTRAST:  100mL OMNIPAQUE IOHEXOL 300 MG/ML  SOLN

[Series 2: axial st · axial · 0.90mm/px · z∈[-495,-85]mm · 12 of 96 slices shown, 14 images]
[im 7/96  soft-tissue]
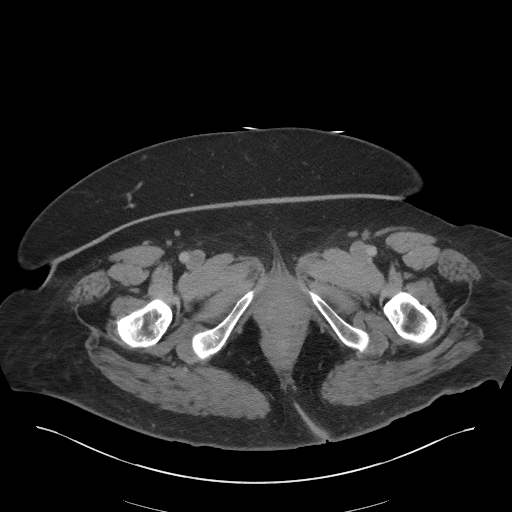
[im 7/96  bone]
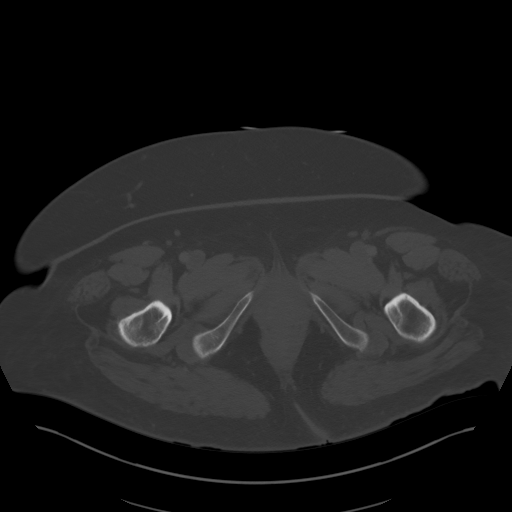
[im 13/96  soft-tissue]
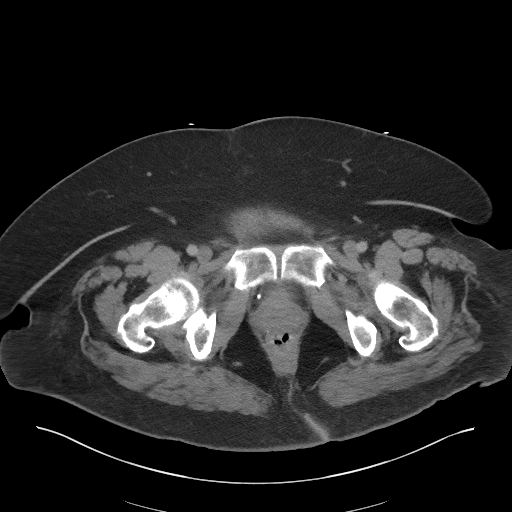
[im 20/96  soft-tissue]
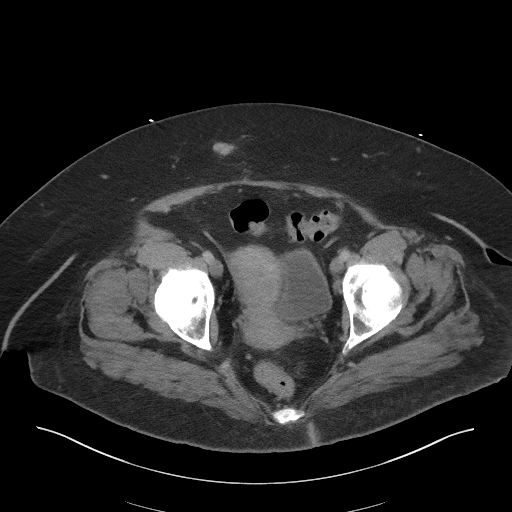
[im 32/96  soft-tissue]
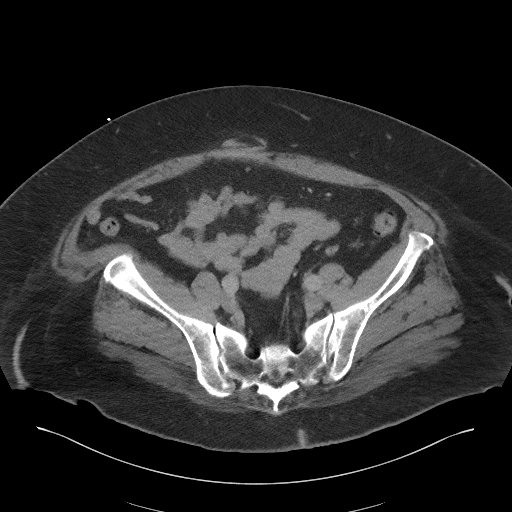
[im 39/96  soft-tissue]
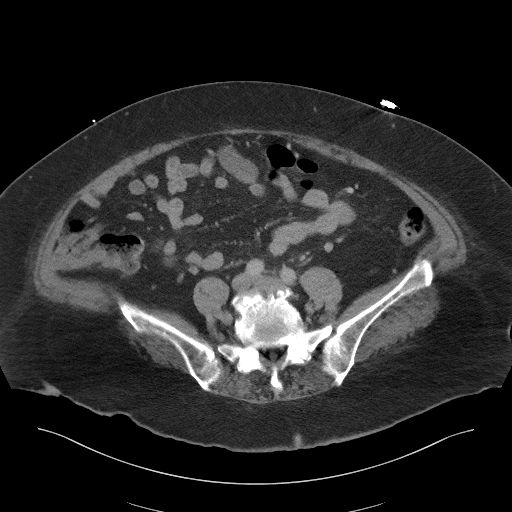
[im 45/96  soft-tissue]
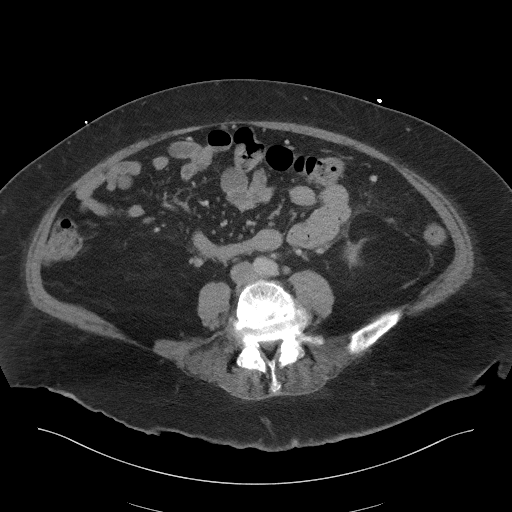
[im 51/96  soft-tissue]
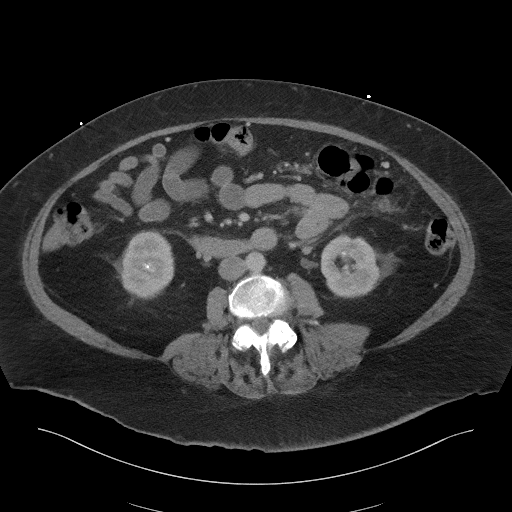
[im 58/96  soft-tissue]
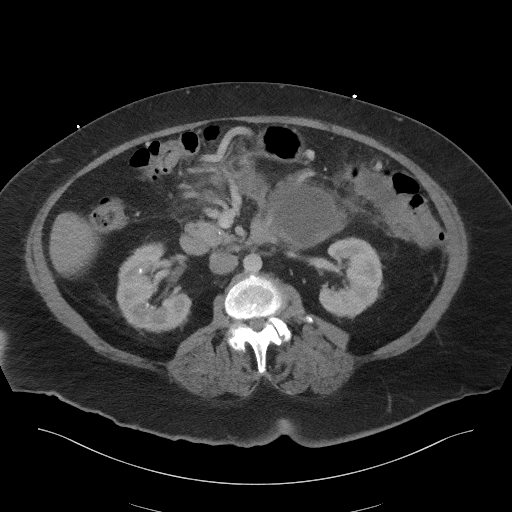
[im 64/96  soft-tissue]
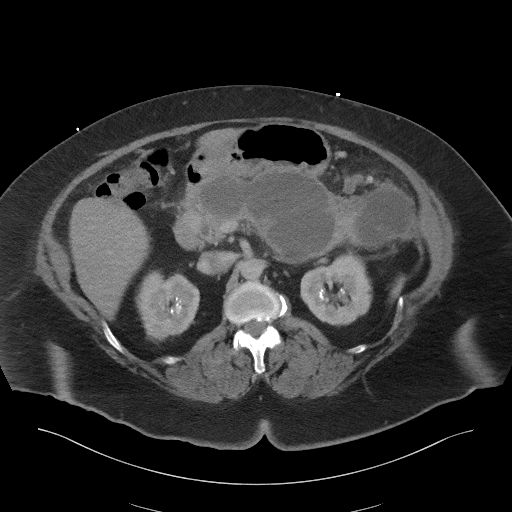
[im 64/96  bone]
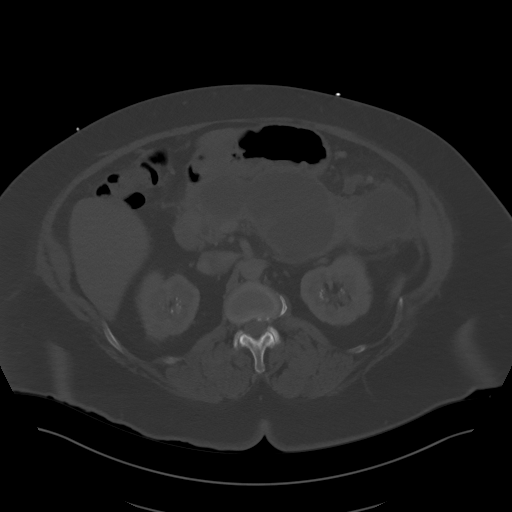
[im 77/96  soft-tissue]
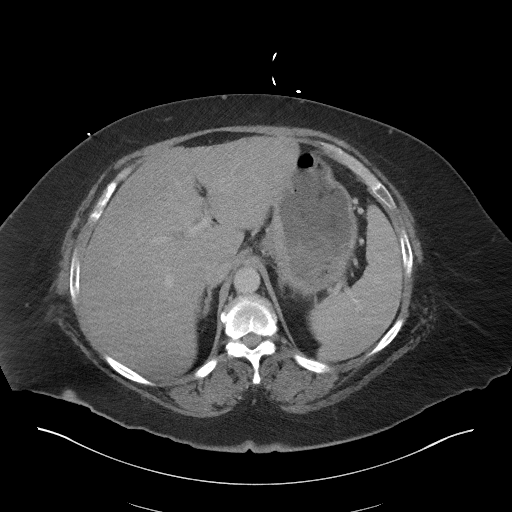
[im 83/96  soft-tissue]
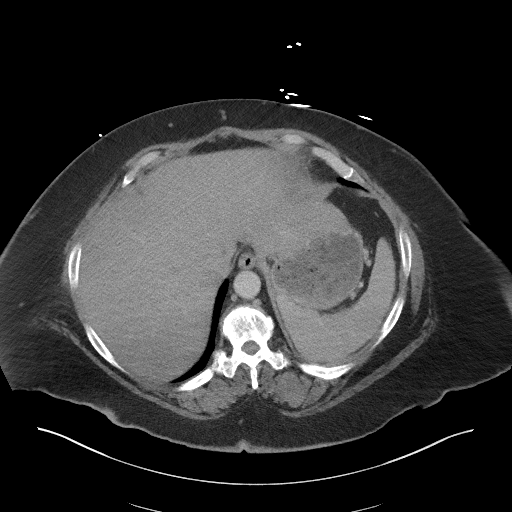
[im 89/96  soft-tissue]
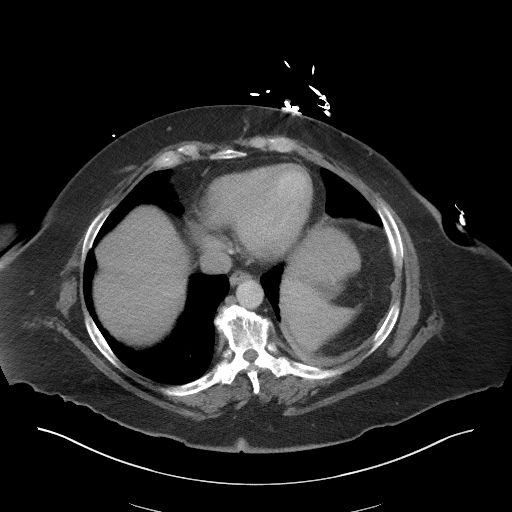

[Series 4: coronal st · coronal · 0.87mm/px · 3 of 104 slices shown]
[im 35/104  soft-tissue]
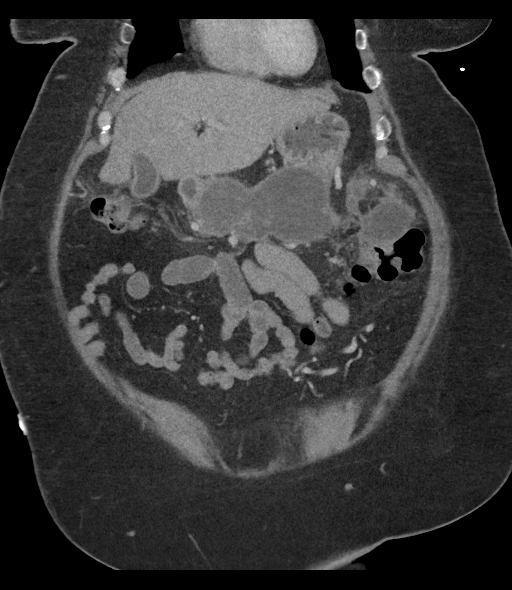
[im 46/104  soft-tissue]
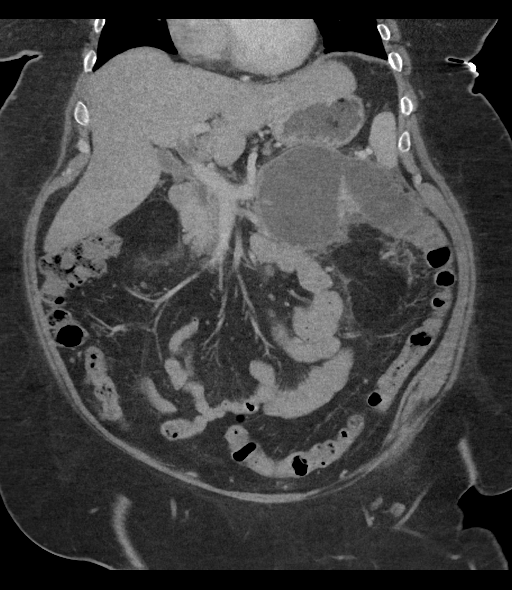
[im 58/104  soft-tissue]
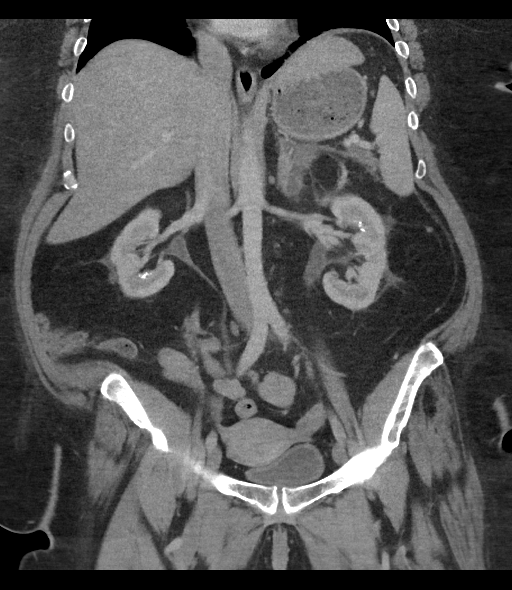

[15 of 46 positions shown; findings below may reference images not displayed]

FINDINGS: Lower chest: Left lower lobe atelectasis with trace left pleural
effusion.

Hepatobiliary: No focal hepatic lesion. There are slightly prominent
size. Gallbladder partially distended. No calcified gallstone.
Slight common bile duct prominence a 7 mm.

Pancreas: Peripancreatic fluid collection extending along the body
and tail, dominant collection measuring 12.3 x 6.7 cm, not
significantly changed from prior exam. Adjacent collection measures
5.9 x 5.2 cm, also not significantly changed. A 2.5 cm collection in
the left upper quadrant may be contiguous with the smaller
pseudocysts. No air within either of these collections. Mild
generalized fat stranding which is grossly stable. No new
peripancreatic fluid collection. Calcifications at the pancreatic
head again seen. Pancreatic duct not well defined.

Spleen: Upper normal in size spanning 13 cm. No splenic infarct or
focal abnormality.

Adrenals/Urinary Tract: No adrenal nodule. No hydronephrosis.
Similar bilateral perinephric edema. Multiple calcifications within
bilateral renal pyramids likely combination of nonobstructing stones
and nephrocalcinosis. No ureteral calculi. Urinary bladder is
partially distended.

Stomach/Bowel: Portions of the stomach about the pancreatic
pseudocyst. No abnormal gastric distension. No small bowel
inflammation or obstruction. Prior appendectomy. The splenic flexure
abuts the pancreatic pseudocyst in the left upper quadrant with mild
adjacent wall thickening, likely reactive. Small volume of colonic
stool.

Vascular/Lymphatic: Aortic atherosclerosis. No aortic aneurysm. The
portal vein is patent. Splenic vein is attenuated not definitely
included. There a few prominent left upper quadrant collaterals. No
adenopathy.

Reproductive: Uterus and bilateral adnexa are unremarkable.

Other: Peripancreatic fluid collections as described. No new
intra-abdominal fluid collection. No ascites. Scattered subcutaneous
densities in the anterior abdominal wall suggesting medication
injection sites. There is no free air.

Musculoskeletal: Degenerative change in the lumbar spine. Bilateral
L4 pars interarticularis defects with anterolisthesis of L4 on L5.
IMPRESSION: 1. Unchanged pancreatic pseudocysts, largest collection in the left
upper quadrant measures 12.3 x 6.7 cm, not significantly changed
from exam earlier this month. No air within either of these
collections. Mild residual stranding which has slightly improved. No
new fluid collection.
2. The stomach and splenic flexure of the colon about these
pancreatic collections with mild wall thickening, likely reactive.
3. Sequela of chronic pancreatitis with pancreatic head
calcifications.
4. Bilateral nephrolithiasis and nephrocalcinosis.
5. Left lower lobe atelectasis with trace left pleural effusion.
6. Additional stable chronic findings as described.

Aortic Atherosclerosis ([GM]-[GM]).

## 2019-08-16 MED ORDER — INSULIN ASPART 100 UNIT/ML ~~LOC~~ SOLN
5.0000 [IU] | Freq: Once | SUBCUTANEOUS | Status: AC
  Administered 2019-08-17: 5 [IU] via SUBCUTANEOUS
  Filled 2019-08-16: qty 0.05

## 2019-08-16 MED ORDER — SODIUM CHLORIDE (PF) 0.9 % IJ SOLN
INTRAMUSCULAR | Status: AC
  Filled 2019-08-16: qty 50

## 2019-08-16 MED ORDER — SODIUM CHLORIDE 0.9 % IV BOLUS
1000.0000 mL | Freq: Once | INTRAVENOUS | Status: AC
  Administered 2019-08-16: 1000 mL via INTRAVENOUS

## 2019-08-16 MED ORDER — MORPHINE SULFATE (PF) 4 MG/ML IV SOLN
6.0000 mg | Freq: Once | INTRAVENOUS | Status: AC
  Administered 2019-08-17: 6 mg via INTRAVENOUS
  Filled 2019-08-16: qty 2

## 2019-08-16 MED ORDER — MORPHINE SULFATE (PF) 4 MG/ML IV SOLN
6.0000 mg | Freq: Once | INTRAVENOUS | Status: AC
  Administered 2019-08-16: 6 mg via INTRAVENOUS
  Filled 2019-08-16: qty 2

## 2019-08-16 MED ORDER — LIDOCAINE VISCOUS HCL 2 % MT SOLN
15.0000 mL | Freq: Once | OROMUCOSAL | Status: AC
  Administered 2019-08-16: 15 mL via ORAL
  Filled 2019-08-16: qty 15

## 2019-08-16 MED ORDER — INSULIN ASPART 100 UNIT/ML ~~LOC~~ SOLN
5.0000 [IU] | Freq: Once | SUBCUTANEOUS | Status: AC
  Administered 2019-08-16: 5 [IU] via SUBCUTANEOUS
  Filled 2019-08-16: qty 0.05

## 2019-08-16 MED ORDER — ONDANSETRON HCL 4 MG/2ML IJ SOLN
4.0000 mg | Freq: Once | INTRAMUSCULAR | Status: AC
  Administered 2019-08-16: 19:00:00 4 mg via INTRAVENOUS
  Filled 2019-08-16: qty 2

## 2019-08-16 MED ORDER — IOHEXOL 300 MG/ML  SOLN
100.0000 mL | Freq: Once | INTRAMUSCULAR | Status: AC | PRN
  Administered 2019-08-16: 100 mL via INTRAVENOUS

## 2019-08-16 MED ORDER — ALUM & MAG HYDROXIDE-SIMETH 200-200-20 MG/5ML PO SUSP
30.0000 mL | Freq: Once | ORAL | Status: AC
  Administered 2019-08-16: 30 mL via ORAL
  Filled 2019-08-16: qty 30

## 2019-08-16 NOTE — H&P (Signed)
[facility] History and Physical  Monique AdlerLisa Ann Holland ZOX:[id number] DOB: 12/29/1965 DOA: 08/16/2019  Referring physician: Fayrene HelperBowie Tran, PA-C PCP: Knox RoyaltyJones, Enrico, MD   Chief Complaint: abdominal pain  HPI: Monique AdlerLisa Ann Holland is a 54 y.o. female with hx of IDDM2, HTN, gastritis, chronic pancreatitis w pseudocyst, morbid obesity, who presents with abdominal pain.   Chart review shows multiple recent admissions for abdominal pain secondary to pancreatitis: - 08/02/2019 - 08/06/2019 admitted for epigastric abdominal pain and n/v thought to be secondary to gastritis/esophagitis. Also had MRCP which showed 12.6cm pseudocyst unchanged from recent CT. Seen by GI during admission with no plan for acute intervention. - 07/17/2019 - 07/21/2019 admitted for hematemesis. 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 [facility] she had endoscopy which showed diffuse white plaque in esophagus suspicious for candidal disease and gastritis but otherwise normal, pathology ultimately showed acute esophagitis and mild chronic gastritis, notably pathology stains were negative for fungal organisms - 06/18/2019 - 07/10/2019 d/c summ from [facility] in Care Everywhere and already summarized above, per plan GI at [facility] considering endoscopic drainage pending progression of fluid collection  Patient reports today has had significant abdominal pain in stomach area. Then started to vomit and noted blood in her emesis. Describes blood as liquid, no clots or coffee grounds that she saw. Also endorsing diarrhea with black and green color, says it is liquidy and denies any stickiness/tarriness. Denies fevers. Endorses regular diet, no ibuprofen, no EtOH. Endorses compliance with protonix prescribed after last discharge.   In the ED VS were stable, labs  notable for hyperglycemia to 403 on initial CMP as well as mild AKI with Cr 1.1 (b/l 0.8-0.9), hemoccult negative, hgb 10.5 stable from last check. CT A/P obtained with no significant changes from prior. Treated with zofran, maalox, viscous lido, IV moprhine, NS bolus x2, initially symptoms improved however then worsened and patient apprehensive about returning home, admitted for acute on chronic abdominal pain.    Review of Systems:  Pertinent positives and negative per HPI, all others reviewed and negative   Past Medical History:  Diagnosis Date  . Anxiety   . Asthma   . Depression   . Diabetes mellitus without complication (HCC)   . Frequency of urination   . GERD (gastroesophageal reflux disease)   . History of asthma    last episode yrs ago  . Hypertension   . OSA (obstructive sleep apnea)    pt had study done oct 2014--  schedule for cpap titrate after kidney stone surgery  . Pancreatitis   . Schizophrenia (HCC)   . Ureteral calculi    BILATERAL  . Wears glasses    Past Surgical History:  Procedure Laterality Date  . BIOPSY  07/18/2019   Procedure: BIOPSY;  Surgeon: Shellia Cleverlyirigliano, Vito V, DO;  Location: [facility];  Service: Gastroenterology;;  . CESAREAN SECTION  1991  &  2002   w/ bilateral tubal ligation in 2002  . CYSTOSCOPY W/ URETERAL STENT PLACEMENT Right 08/10/2018   Procedure: CYSTOSCOPY WITH RETROGRADE PYELOGRAM/URETERAL STENT PLACEMENT;  Surgeon: Jerilee FieldEskridge, Nikos Anglemyer, MD;  Location: [facility];  Service: Urology;  Laterality: Right;  . CYSTOSCOPY WITH RETROGRADE PYELOGRAM, URETEROSCOPY AND STENT PLACEMENT Bilateral 05/02/2013   Procedure: CYSTOSCOPY WITH RETROGRADE PYELOGRAM, URETEROSCOPY ;  Surgeon: Valetta Fulleravid S Grapey, MD;  [facility];  Service: Urology;  Laterality: Bilateral;  .  CYSTOSCOPY WITH RETROGRADE PYELOGRAM, URETEROSCOPY AND STENT PLACEMENT Right 08/15/2018   Procedure: CYSTOSCOPY WITH RIGHT RETROGRADE PYELOGRAM, RIGHT URETEROSCOPY WITH  HOLMIUM LASER AND STENT PLACEMENT;  Surgeon: Crista Elliot, MD;  Location: [facility];  Service: Urology;  Laterality: Right;  . CYSTOSCOPY WITH STENT PLACEMENT Left 05/02/2013   Procedure: CYSTOSCOPY WITH STENT PLACEMENT;  Surgeon: Valetta Fuller, MD;  Location: [facility];  Service: Urology;  Laterality: Left;  . ESOPHAGOGASTRODUODENOSCOPY (EGD) WITH PROPOFOL N/A 07/18/2019   Procedure: ESOPHAGOGASTRODUODENOSCOPY (EGD) WITH PROPOFOL;  Surgeon: Shellia Cleverly, DO;  Location: [facility];  Service: Gastroenterology;  Laterality: N/A;  . HOLMIUM LASER APPLICATION Bilateral 05/02/2013   Procedure: HOLMIUM LASER APPLICATION;  Surgeon: Valetta Fuller, MD;  Location: [facility];  Service: Urology;  Laterality: Bilateral;  . LAPAROSCOPIC APPENDECTOMY N/A 05/22/2014   Procedure: APPENDECTOMY LAPAROSCOPIC;  Surgeon: Glenna Fellows, MD;  Location: [facility];  Service: General;  Laterality: N/A;  . TONSILLECTOMY  age 72   Social History:  reports that she has never smoked. She has never used smokeless tobacco. She reports that she does not drink alcohol or use drugs.  No Known Allergies  Family History  Family history unknown: Yes     Prior to Admission medications   Medication Sig Start Date End Date Taking? Authorizing Provider  albuterol (VENTOLIN HFA) 108 (90 Base) MCG/ACT inhaler Inhale 1-2 puffs into the lungs every 6 (six) hours as needed for wheezing or shortness of breath. 07/21/19  Yes Regalado, Belkys A, MD  amitriptyline (ELAVIL) 75 MG tablet Take 75 mg by mouth at bedtime. 08/10/19  Yes [provider]  amLODipine (NORVASC) 10 MG tablet Take 10 mg by mouth daily. 08/09/19  Yes [provider]  clonazePAM (KLONOPIN) 0.5 MG tablet Take 0.5 mg by mouth daily as needed for anxiety. 05/23/19  Yes [provider]  lamoTRIgine (LAMICTAL) 200 MG tablet Take 200 mg by mouth daily. 06/06/19  Yes [provider]  LANTUS SOLOSTAR  100 UNIT/ML Solostar Pen Inject 15 Units into the skin at bedtime. Patient taking differently: Inject 30 Units into the skin at bedtime.  07/21/19  Yes Regalado, Belkys A, MD  LATUDA 60 MG TABS Take 1 tablet by mouth at bedtime. 08/10/19  Yes [provider]  lisinopril-hydrochlorothiazide (ZESTORETIC) 20-12.5 MG tablet Take 1 tablet by mouth 2 (two) times daily. 07/31/19  Yes [provider]  metoprolol tartrate (LOPRESSOR) 25 MG tablet Take 0.5 tablets (12.5 mg total) by mouth 2 (two) times daily. 08/05/19  Yes Alwyn Ren, MD  ondansetron (ZOFRAN) 8 MG tablet Take 8 mg by mouth every 8 (eight) hours as needed for nausea or vomiting.  07/26/19  Yes [provider]  Oxycodone HCl 10 MG TABS Take 10 mg by mouth 4 (four) times daily as needed (pain).  08/12/19  Yes [provider]  pantoprazole (PROTONIX) 40 MG tablet Take 1 tablet (40 mg total) by mouth 2 (two) times daily. 08/05/19 09/04/19 Yes Alwyn Ren, MD  PREMARIN vaginal cream Place 1 Applicatorful vaginally at bedtime.  08/10/19  Yes [provider]  senna (SENOKOT) 8.6 MG TABS tablet Take 1 tablet (8.6 mg total) by mouth 2 (two) times daily. 07/21/19  Yes Regalado, Belkys A, MD  SYMBICORT 160-4.5 MCG/ACT inhaler Inhale 2 puffs into the lungs 2 (two) times daily as needed (SOB/wheezing).  04/26/19  Yes [provider]  traZODone (DESYREL) 100 MG tablet Take 100 mg by mouth at bedtime.  Yes [provider]  TRULICITY 4.5 ZJ/6.7HA SOPN Inject 4.5 mg into the skin once a week. Take on Wed 07/29/19   [provider]   Physical Exam: Vitals:   08/16/19 2200 08/16/19 2236 08/16/19 2300 08/17/19 0015  BP: 130/85 130/85 (!) 129/91   Pulse: 95 97 96 95  Resp: (!) 22 14 (!) 29 17  Temp:      TempSrc:      SpO2: 98% 93% 91% 97%    Wt Readings from Last 3 Encounters:  08/06/19 (!) 138.4 kg  07/29/19 (!) 137.4 kg  07/18/19 (!) 142 kg    General:  Appears calm and  comfortable Eyes: PERRL, normal lids, irises & conjunctiva ENT: grossly normal hearing, lips & tongue Neck: no LAD, masses or thyromegaly Cardiovascular: RRR, no m/r/g. No LE edema.  Respiratory: CTA bilaterally, no w/r/r. Normal respiratory effort. Abdomen: soft, mild epigastric and diffuse upper quadrant ttp Skin: no rash or induration seen on limited exam Musculoskeletal: grossly normal tone BUE/BLE Psychiatric: grossly normal mood and affect, speech fluent and appropriate Neurologic: grossly non-focal.          Labs on Admission:  Basic Metabolic Panel: Recent Labs  Lab 08/16/19 1920  NA 134*  K 3.6  CL 98  CO2 24  GLUCOSE 403*  BUN 11  CREATININE 1.10*  CALCIUM 9.3   Liver Function Tests: Recent Labs  Lab 08/16/19 1920  AST 34  ALT 25  ALKPHOS 77  BILITOT 0.7  PROT 7.0  ALBUMIN 3.7   Recent Labs  Lab 08/16/19 1920  LIPASE 35   No results for input(s): AMMONIA in the last 168 hours. CBC: Recent Labs  Lab 08/16/19 1920  WBC 7.9  NEUTROABS 4.1  HGB 10.5*  HCT 36.0  MCV 78.9*  PLT 297   Cardiac Enzymes: No results for input(s): CKTOTAL, CKMB, CKMBINDEX, TROPONINI in the last 168 hours.  BNP (last 3 results) No results for input(s): BNP in the last 8760 hours.  ProBNP (last 3 results) No results for input(s): PROBNP in the last 8760 hours.  CBG: Recent Labs  Lab 08/16/19 2230 08/17/19 0016  GLUCAP 315* 280*    Radiological Exams on Admission: CT ABDOMEN PELVIS W CONTRAST  Result Date: 08/17/2019 CLINICAL DATA:  Recurrent left upper quadrant abdominal pain. Nausea and vomiting. Recent diagnosis of necrotizing pancreatitis. EXAM: CT ABDOMEN AND PELVIS WITH CONTRAST TECHNIQUE: Multidetector CT imaging of the abdomen and pelvis was performed using the standard protocol following bolus administration of intravenous contrast. CONTRAST:  171mL OMNIPAQUE IOHEXOL 300 MG/ML  SOLN COMPARISON:  Most recent CT 07/29/2019. FINDINGS: Lower chest: Left  lower lobe atelectasis with trace left pleural effusion. Hepatobiliary: No focal hepatic lesion. There are slightly prominent size. Gallbladder partially distended. No calcified gallstone. Slight common bile duct prominence a 7 mm. Pancreas: Peripancreatic fluid collection extending along the body and tail, dominant collection measuring 12.3 x 6.7 cm, not significantly changed from prior exam. Adjacent collection measures 5.9 x 5.2 cm, also not significantly changed. A 2.5 cm collection in the left upper quadrant may be contiguous with the smaller pseudocysts. No air within either of these collections. Mild generalized fat stranding which is grossly stable. No new peripancreatic fluid collection. Calcifications at the pancreatic head again seen. Pancreatic duct not well defined. Spleen: Upper normal in size spanning 13 cm. No splenic infarct or focal abnormality. Adrenals/Urinary Tract: No adrenal nodule. No hydronephrosis. Similar bilateral perinephric edema. Multiple calcifications within bilateral renal pyramids likely combination of  nonobstructing stones and nephrocalcinosis. No ureteral calculi. Urinary bladder is partially distended. Stomach/Bowel: Portions of the stomach about the pancreatic pseudocyst. No abnormal gastric distension. No small bowel inflammation or obstruction. Prior appendectomy. The splenic flexure abuts the pancreatic pseudocyst in the left upper quadrant with mild adjacent wall thickening, likely reactive. Small volume of colonic stool. Vascular/Lymphatic: Aortic atherosclerosis. No aortic aneurysm. The portal vein is patent. Splenic vein is attenuated not definitely included. There a few prominent left upper quadrant collaterals. No adenopathy. Reproductive: Uterus and bilateral adnexa are unremarkable. Other: Peripancreatic fluid collections as described. No new intra-abdominal fluid collection. No ascites. Scattered subcutaneous densities in the anterior abdominal wall suggesting  medication injection sites. There is no free air. Musculoskeletal: Degenerative change in the lumbar spine. 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 IMPRESSION: 1. Unchanged pancreatic pseudocysts, largest collection in the left upper quadrant measures 12.3 x 6.7 cm, not significantly changed from exam earlier this month. No air within either of these collections. Mild residual stranding which has slightly improved. No new fluid collection. 2. The stomach and splenic flexure of the colon about these pancreatic collections with mild wall thickening, likely reactive. 3. Sequela of chronic pancreatitis with pancreatic head calcifications. 4. Bilateral nephrolithiasis and nephrocalcinosis. 5. Left lower lobe atelectasis with trace left pleural effusion. 6. Additional stable chronic findings as described. Aortic Atherosclerosis (ICD10-I70.0). Electronically Signed   By: Narda Rutherford M.D.   On: 08/17/2019 00:21    EKG: not obtained  Assessment/Plan Active Problems:   AKI (acute kidney injury) (HCC)   Type 2 diabetes mellitus (HCC)   Schizophrenia (HCC)   Obesity, Class III, BMI 40-49.9 (morbid obesity) (HCC)   Acute pancreatitis   Pancreatic pseudocyst   Abdominal pain   Essential hypertension   #Acute on chronic abdominal pain #Chronic pancreatitis  #Pancreatic pseudocyst Patient presenting for fourth admission in three months for acute on chronic abdominal pain. Mild esophagitis and gastritis on prior exams, thought to be fungal though notably pathology stains negative for fungal organisms. Reports she is compliant with PPI therapy. CT scan on admission without significant changes or acute pathology. Etiology most likely secondary to combination of esophagitis/gastritis as well as mass effect of pseudocyst. Pancreatitis also a possibility, lipase low though given extensive disease not a reliable indicator. Given ongoing pain and recurrent admissions warrants  re-evaluation for possible intervention of her pseudocyst which appears to have been previously considered based on d/c summary from [facility].  - IVF@150cc  - NPO, ADAT - dilaudid PRN for pain - cont PPI - GI consult in AM  #Hematemesis #?Melena Patient reporting hematemesis and melena, though description not totally consistent with the latter. Suspect streaks of blood rather than frank hematemesis patient is describing, and negative hemoccult as well as stable CBC argues against significant bleeding or melena. Regardless given prior esophagitis/gastritis seen on recent EGD will make NPO and T&S.  - T&S x2u - NPO - consider repeat EGD - GI consult in AM  #Diarrhea May be secondary to pancreatitis, consider replacement enzymes pending progression.   #AKI Likely secondary to dehydration, s/p 2L IVF, repeat CMP in AM.   #IDDM Resume regimen from prior admission - lantus 5u qhs - LISS - hold trulicity  #Known medical problems Asthma - cont albuterol HTN - cont amlodipine, lisinopril-HCTZ, metoprolol Anxiety - cont clonazepam 0.5mg  daily PRN Depression - cont amitriptyline, trazodone Schizophrenia?/BPD? - cont lamictal, latuda   Code Status: Full Code confirmed DVT Prophylaxis: held Family Communication: not updated  at time of admission Disposition Plan: Obs  Time spent: 100 min  Venora Maples MD/[facility]

## 2019-08-16 NOTE — ED Notes (Signed)
Pt sitting up in bed watching t.v. monitor on. Pt denies any needs. Will continue to monitor.

## 2019-08-16 NOTE — ED Provider Notes (Signed)
[facility]-EMERGENCY DEPT Provider Note   CSN: [id number] Arrival date & time: 08/16/19  1741     History Chief Complaint  Patient presents with  . Nausea  . Emesis    Aima Mcwhirt is a 54 y.o. female.  The history is provided by the patient and medical records. No language interpreter was used.  Emesis    54 year old female with history of nonalcoholic pancreatitis, schizophrenia, GERD, hypertension, diabetes, brought here via EMS from home for evaluation of abdominal pain.  Patient report gradual onset of upper abdominal pain that started earlier today.  Pain is described as a sharp sensation across her upper abdomen, persistent, moderate to severe, with associated nausea vomiting diarrhea.  States she vomited up copious amount of bright red blood as well as having black tarry stool.  No associated fever chills no chest pain or shortness of breath no productive cough no dysuria.  She mentioned being admitted to the hospital for several days approximately 2 weeks ago for similar symptoms and was diagnosed with pancreatitis.  She denies alcohol use.  No recent medication changes.  Past Medical History:  Diagnosis Date  . Anxiety   . Asthma   . Depression   . Diabetes mellitus without complication (HCC)   . Frequency of urination   . GERD (gastroesophageal reflux disease)   . History of asthma    last episode yrs ago  . Hypertension   . OSA (obstructive sleep apnea)    pt had study done oct 2014--  schedule for cpap titrate after kidney stone surgery  . Pancreatitis   . Schizophrenia (HCC)   . Ureteral calculi    BILATERAL  . Wears glasses     Patient Active Problem List   Diagnosis Date Noted  . Abdominal pain 08/03/2019  . Essential hypertension 08/03/2019  . Pancreatic pseudocyst   . Nausea and vomiting in adult   . Necrotizing pancreatitis   . Gastritis and gastroduodenitis   . Candida esophagitis (HCC)   . Acute pancreatitis 07/17/2019   . Schizophrenia (HCC) 09/02/2018  . Obesity, Class III, BMI 40-49.9 (morbid obesity) (HCC) 09/02/2018  . Iron deficiency 09/02/2018  . Symptomatic anemia 09/01/2018  . Hypokalemia 09/01/2018  . Severe sepsis (HCC) 08/11/2018  . AKI (acute kidney injury) (HCC) 08/11/2018  . Hypotension 08/11/2018  . Type 2 diabetes mellitus (HCC) 08/11/2018  . Acute appendicitis 05/22/2014  . Ureteral calculus, right 05/02/2013    Past Surgical History:  Procedure Laterality Date  . BIOPSY  07/18/2019   Procedure: BIOPSY;  Surgeon: Shellia Cleverly, DO;  Location: [facility];  Service: Gastroenterology;;  . CESAREAN SECTION  1991  &  2002   w/ bilateral tubal ligation in 2002  . CYSTOSCOPY W/ URETERAL STENT PLACEMENT Right 08/10/2018   Procedure: CYSTOSCOPY WITH RETROGRADE PYELOGRAM/URETERAL STENT PLACEMENT;  Surgeon: Jerilee Field, MD;  Location: [facility];  Service: Urology;  Laterality: Right;  . CYSTOSCOPY WITH RETROGRADE PYELOGRAM, URETEROSCOPY AND STENT PLACEMENT Bilateral 05/02/2013   Procedure: CYSTOSCOPY WITH RETROGRADE PYELOGRAM, URETEROSCOPY ;  Surgeon: Valetta Fuller, MD;  Location[facility];  Service: Urology;  Laterality: Bilateral;  . CYSTOSCOPY WITH RETROGRADE PYELOGRAM, URETEROSCOPY AND STENT PLACEMENT Right 08/15/2018   Procedure: CYSTOSCOPY WITH RIGHT RETROGRADE PYELOGRAM, RIGHT URETEROSCOPY WITH HOLMIUM LASER AND STENT PLACEMENT;  Surgeon: Crista Elliot, MD;  Location: [facility];  Service: Urology;  Laterality: Right;  . CYSTOSCOPY WITH STENT PLACEMENT Left 05/02/2013   Procedure: CYSTOSCOPY WITH STENT PLACEMENT;  Surgeon: Valetta Fuller, MD;  Location: [facility];  Service: Urology;  Laterality: Left;  . ESOPHAGOGASTRODUODENOSCOPY (EGD) WITH PROPOFOL N/A 07/18/2019   Procedure: ESOPHAGOGASTRODUODENOSCOPY (EGD) WITH PROPOFOL;  Surgeon: Shellia Cleverly, DO;  Location: [facility];  Service: Gastroenterology;  Laterality: N/A;  . HOLMIUM LASER  APPLICATION Bilateral 05/02/2013   Procedure: HOLMIUM LASER APPLICATION;  Surgeon: Valetta Fuller, MD;  Location: [facility];  Service: Urology;  Laterality: Bilateral;  . LAPAROSCOPIC APPENDECTOMY N/A 05/22/2014   Procedure: APPENDECTOMY LAPAROSCOPIC;  Surgeon: Glenna Fellows, MD;  Location: [facility];  Service: General;  Laterality: N/A;  . TONSILLECTOMY  age 87     OB History   No obstetric history on file.     Family History  Family history unknown: Yes    Social History   Tobacco Use  . Smoking status: Never Smoker  . Smokeless tobacco: Never Used  Substance Use Topics  . Alcohol use: No  . Drug use: No    Home Medications Prior to Admission medications   Medication Sig Start Date End Date Taking? Authorizing Provider  acetaminophen (TYLENOL) 325 MG tablet Take 2 tablets (650 mg total) by mouth every 6 (six) hours as needed for mild pain (or Fever >/= 101). 07/21/19   Regalado, Belkys A, MD  albuterol (VENTOLIN HFA) 108 (90 Base) MCG/ACT inhaler Inhale 1-2 puffs into the lungs every 6 (six) hours as needed for wheezing or shortness of breath. 07/21/19   Regalado, Belkys A, MD  amitriptyline (ELAVIL) 50 MG tablet Take 50 mg by mouth at bedtime. 07/22/19   [provider]  amLODipine (NORVASC) 5 MG tablet Take 5 mg by mouth daily. 07/26/19   [provider]  clonazePAM (KLONOPIN) 0.5 MG tablet Take 0.5 mg by mouth daily as needed for anxiety. 05/23/19   [provider]  lactulose (CHRONULAC) 10 GM/15ML solution Take 30 mLs (20 g total) by mouth daily as needed for mild constipation. 08/06/19   Alwyn Ren, MD  lamoTRIgine (LAMICTAL) 200 MG tablet Take 200 mg by mouth daily. 06/06/19   [provider]  LANTUS SOLOSTAR 100 UNIT/ML Solostar Pen Inject 15 Units into the skin at bedtime. 07/21/19   Regalado, Belkys A, MD  lisinopril-hydrochlorothiazide (ZESTORETIC) 20-12.5 MG tablet Take 1 tablet by mouth 2 (two) times daily.  07/31/19   [provider]  lurasidone (LATUDA) 40 MG TABS tablet Take 1 tablet (40 mg total) by mouth at bedtime. 07/21/19   Regalado, Belkys A, MD  metoprolol tartrate (LOPRESSOR) 25 MG tablet Take 0.5 tablets (12.5 mg total) by mouth 2 (two) times daily. 08/05/19   Alwyn Ren, MD  OLANZapine (ZYPREXA) 5 MG tablet Take 5 mg by mouth at bedtime. 06/01/19   [provider]  ondansetron (ZOFRAN) 8 MG tablet Take 8 mg by mouth every 8 (eight) hours as needed for nausea or vomiting.  07/26/19   [provider]  oxyCODONE (ROXICODONE) 5 MG immediate release tablet Take 1 tablet (5 mg total) by mouth every 4 (four) hours as needed for severe pain. 07/29/19   Pricilla Loveless, MD  pantoprazole (PROTONIX) 40 MG tablet Take 1 tablet (40 mg total) by mouth 2 (two) times daily. 08/05/19 09/04/19  Alwyn Ren, MD  senna (SENOKOT) 8.6 MG TABS tablet Take 1 tablet (8.6 mg total) by mouth 2 (two) times daily. 07/21/19   Regalado, Prentiss Bells, MD  SYMBICORT 160-4.5 MCG/ACT inhaler Inhale 2 puffs into the lungs 2 (two) times  daily as needed (SOB/wheezing).  04/26/19   [provider]  traZODone (DESYREL) 100 MG tablet Take 100 mg by mouth at bedtime.    [provider]  TRULICITY 4.5 VO/1.6WV SOPN Inject 4.5 mg into the skin once a week. Take on Mondays 07/29/19   [provider]    Allergies    Patient has no known allergies.  Review of Systems   Review of Systems  Gastrointestinal: Positive for vomiting.  All other systems reviewed and are negative.   Physical Exam Updated Vital Signs BP 128/86 (BP Location: Right Arm)   Pulse (!) 101   Temp 99.1 F (37.3 C) (Oral)   Resp 14   LMP 03/23/2014 Comment: patient states her periods are irregular  SpO2 99%   Physical Exam Vitals and nursing note reviewed.  Constitutional:      General: She is not in acute distress.    Appearance: She is well-developed. She is obese.  HENT:     Head: Atraumatic.   Eyes:     Conjunctiva/sclera: Conjunctivae normal.  Cardiovascular:     Rate and Rhythm: Tachycardia present.     Pulses: Normal pulses.     Heart sounds: Normal heart sounds.  Pulmonary:     Effort: Pulmonary effort is normal.     Breath sounds: Normal breath sounds.  Abdominal:     General: Abdomen is flat.     Palpations: Abdomen is soft.     Tenderness: There is abdominal tenderness (Tenderness to epigastric right upper and left upper quadrant on palpation no guarding rebound tenderness.  No pain at McBurney's point.).  Genitourinary:    Comments: Chaperone present during exam.  Normal rectal tone, no obvious mass, normal color stool on glove, fecal occult blood test is negative. Musculoskeletal:     Cervical back: Neck supple.  Skin:    Findings: No rash.  Neurological:     Mental Status: She is alert and oriented to person, place, and time.  Psychiatric:        Mood and Affect: Mood normal.     ED Results / Procedures / Treatments   Labs (all labs ordered are listed, but only abnormal results are displayed) Labs Reviewed  CBC WITH DIFFERENTIAL/PLATELET - Abnormal; Notable for the following components:      Result Value   Hemoglobin 10.5 (*)    MCV 78.9 (*)    MCH 23.0 (*)    MCHC 29.2 (*)    All other components within normal limits  COMPREHENSIVE METABOLIC PANEL - Abnormal; Notable for the following components:   Sodium 134 (*)    Glucose, Bld 403 (*)    Creatinine, Ser 1.10 (*)    GFR calc non Af Amer 58 (*)    All other components within normal limits  URINALYSIS, ROUTINE W REFLEX MICROSCOPIC - Abnormal; Notable for the following components:   Glucose, UA >=500 (*)    All other components within normal limits  CBG MONITORING, ED - Abnormal; Notable for the following components:   Glucose-Capillary 315 (*)    All other components within normal limits  SARS CORONAVIRUS 2 (TAT 6-24 HRS)  LIPASE, BLOOD  POC OCCULT BLOOD, ED    EKG None  Radiology No  results found.  Procedures Procedures (including critical care time)  Medications Ordered in ED Medications  morphine 4 MG/ML injection 6 mg (6 mg Intravenous Given 08/16/19 1923)  ondansetron (ZOFRAN) injection 4 mg (4 mg Intravenous Given 08/16/19 1924)  sodium chloride 0.9 %  bolus 1,000 mL (1,000 mLs Intravenous New Bag/Given (Non-Interop) 08/16/19 1923)  alum & mag hydroxide-simeth (MAALOX/MYLANTA) 200-200-20 MG/5ML suspension 30 mL (30 mLs Oral Given 08/16/19 2109)    And  lidocaine (XYLOCAINE) 2 % viscous mouth solution 15 mL (15 mLs Oral Given 08/16/19 2109)  sodium chloride 0.9 % bolus 1,000 mL (1,000 mLs Intravenous New Bag/Given (Non-Interop) 08/16/19 2135)  insulin aspart (novoLOG) injection 5 Units (5 Units Subcutaneous Given 08/16/19 2135)    ED Course  I have reviewed the triage vital signs and the nursing notes.  Pertinent labs & imaging results that were available during my care of the patient were reviewed by me and considered in my medical decision making (see chart for details).    MDM Rules/Calculators/A&P                      BP (!) 138/99   Pulse 98   Temp 99.1 F (37.3 C) (Oral)   Resp 19   LMP 03/23/2014 Comment: patient states her periods are irregular  SpO2 94%   Final Clinical Impression(s) / ED Diagnoses Final diagnoses:  Acute gastritis, presence of bleeding unspecified, unspecified gastritis type  Non-intractable vomiting with nausea, unspecified vomiting type  Hyperglycemia    Rx / DC Orders ED Discharge Orders    None     6:36 PM Patient here with complaints of abdominal pain that felt similar to prior pancreatitis 2 weeks ago when she was admitted.  She also endorsed nauseous, hematochezia and melena.  I have reviewed patient's prior notes.  She was recently admitted on 08/02/2019 and was discharged on 08/06/2019 for acute pancreatitis.  She had hematemesis at that admission and underwent EGD which showed Candida esophagitis and was on  Diflucan.  She was diagnosed with necrotizing pancreatitis.  9:14 PM Labs are reassuring.  After receiving GI cocktail and pain medication patient report feeling much better.  At this time, I felt additional CT scan provide any other useful information as her pain has improved and she has had 2 CT scans within the past month.  CBG of 403, IV fluid given, 5 units of insulin given.  Will recheck.  Normal anion gap.  11:04 PM Improves CBG to 315.  Patient however states her abdominal pain is returning and she is a friend to go home tonight.  She did mention her nausea did improve.  She is currently taking Protonix and Diflucan previously prescribed.  Given persistent abdominal discomfort, will repeat abdominal pelvis CT scan to assess pancreas.  She has normal lipase today.  FOBT negative.   11:38 PM Appreciate consultation from Hospitalist who agrees to admit pt for obs.  Repeat CT scan pending at this time.  COVID-19 test ordered.    Monique Holland was evaluated in Emergency Department on 08/16/2019 for the symptoms described in the history of present illness. She was evaluated in the context of the global COVID-19 pandemic, which necessitated consideration that the patient might be at risk for infection with the SARS-CoV-2 virus that causes COVID-19. Institutional protocols and algorithms that pertain to the evaluation of patients at risk for COVID-19 are in a state of rapid change based on information released by regulatory bodies including the CDC and federal and state organizations. These policies and algorithms were followed during the patient's care in the ED.    Fayrene Helper, PA-C 08/16/19 2340    Lorre Nick, MD 08/20/19 [phone]

## 2019-08-16 NOTE — ED Triage Notes (Signed)
Pt BIBA from home.   Per EMS-  Pt c/o n/v abd pain starting today. Pt has hx of pancreatitis.

## 2019-08-17 ENCOUNTER — Encounter (HOSPITAL_COMMUNITY): Payer: Self-pay | Admitting: Family Medicine

## 2019-08-17 DIAGNOSIS — K8681 Exocrine pancreatic insufficiency: Secondary | ICD-10-CM | POA: Diagnosis present

## 2019-08-17 DIAGNOSIS — R1013 Epigastric pain: Secondary | ICD-10-CM | POA: Diagnosis not present

## 2019-08-17 DIAGNOSIS — E876 Hypokalemia: Secondary | ICD-10-CM | POA: Diagnosis present

## 2019-08-17 DIAGNOSIS — R935 Abnormal findings on diagnostic imaging of other abdominal regions, including retroperitoneum: Secondary | ICD-10-CM | POA: Diagnosis not present

## 2019-08-17 DIAGNOSIS — R109 Unspecified abdominal pain: Secondary | ICD-10-CM | POA: Diagnosis present

## 2019-08-17 DIAGNOSIS — F329 Major depressive disorder, single episode, unspecified: Secondary | ICD-10-CM | POA: Diagnosis present

## 2019-08-17 DIAGNOSIS — K863 Pseudocyst of pancreas: Secondary | ICD-10-CM | POA: Diagnosis not present

## 2019-08-17 DIAGNOSIS — K859 Acute pancreatitis without necrosis or infection, unspecified: Secondary | ICD-10-CM

## 2019-08-17 DIAGNOSIS — Z20822 Contact with and (suspected) exposure to covid-19: Secondary | ICD-10-CM | POA: Diagnosis not present

## 2019-08-17 DIAGNOSIS — B9561 Methicillin susceptible Staphylococcus aureus infection as the cause of diseases classified elsewhere: Secondary | ICD-10-CM | POA: Diagnosis present

## 2019-08-17 DIAGNOSIS — F209 Schizophrenia, unspecified: Secondary | ICD-10-CM | POA: Diagnosis present

## 2019-08-17 DIAGNOSIS — K920 Hematemesis: Secondary | ICD-10-CM | POA: Diagnosis not present

## 2019-08-17 DIAGNOSIS — R599 Enlarged lymph nodes, unspecified: Secondary | ICD-10-CM | POA: Diagnosis present

## 2019-08-17 DIAGNOSIS — N179 Acute kidney failure, unspecified: Secondary | ICD-10-CM | POA: Diagnosis not present

## 2019-08-17 DIAGNOSIS — K862 Cyst of pancreas: Secondary | ICD-10-CM | POA: Diagnosis not present

## 2019-08-17 DIAGNOSIS — I10 Essential (primary) hypertension: Secondary | ICD-10-CM | POA: Diagnosis not present

## 2019-08-17 DIAGNOSIS — K861 Other chronic pancreatitis: Secondary | ICD-10-CM | POA: Diagnosis not present

## 2019-08-17 DIAGNOSIS — J45909 Unspecified asthma, uncomplicated: Secondary | ICD-10-CM | POA: Diagnosis present

## 2019-08-17 DIAGNOSIS — Z6841 Body Mass Index (BMI) 40.0 and over, adult: Secondary | ICD-10-CM | POA: Diagnosis not present

## 2019-08-17 DIAGNOSIS — K2900 Acute gastritis without bleeding: Secondary | ICD-10-CM

## 2019-08-17 DIAGNOSIS — B3781 Candidal esophagitis: Secondary | ICD-10-CM | POA: Diagnosis not present

## 2019-08-17 DIAGNOSIS — K8592 Acute pancreatitis with infected necrosis, unspecified: Secondary | ICD-10-CM | POA: Diagnosis not present

## 2019-08-17 DIAGNOSIS — K3189 Other diseases of stomach and duodenum: Secondary | ICD-10-CM | POA: Diagnosis present

## 2019-08-17 DIAGNOSIS — D509 Iron deficiency anemia, unspecified: Secondary | ICD-10-CM | POA: Diagnosis present

## 2019-08-17 DIAGNOSIS — F419 Anxiety disorder, unspecified: Secondary | ICD-10-CM | POA: Diagnosis present

## 2019-08-17 DIAGNOSIS — G4733 Obstructive sleep apnea (adult) (pediatric): Secondary | ICD-10-CM | POA: Diagnosis present

## 2019-08-17 DIAGNOSIS — Z8719 Personal history of other diseases of the digestive system: Secondary | ICD-10-CM | POA: Diagnosis not present

## 2019-08-17 DIAGNOSIS — R112 Nausea with vomiting, unspecified: Secondary | ICD-10-CM | POA: Diagnosis not present

## 2019-08-17 DIAGNOSIS — I82890 Acute embolism and thrombosis of other specified veins: Secondary | ICD-10-CM | POA: Diagnosis not present

## 2019-08-17 DIAGNOSIS — E1165 Type 2 diabetes mellitus with hyperglycemia: Secondary | ICD-10-CM | POA: Diagnosis not present

## 2019-08-17 DIAGNOSIS — K219 Gastro-esophageal reflux disease without esophagitis: Secondary | ICD-10-CM | POA: Diagnosis present

## 2019-08-17 LAB — GLUCOSE, CAPILLARY
Glucose-Capillary: 280 mg/dL — ABNORMAL HIGH (ref 70–99)
Glucose-Capillary: 177 mg/dL — ABNORMAL HIGH (ref 70–99)
Glucose-Capillary: 193 mg/dL — ABNORMAL HIGH (ref 70–99)
Glucose-Capillary: 188 mg/dL — ABNORMAL HIGH (ref 70–99)

## 2019-08-17 LAB — CBC
HCT: 34.6 % — ABNORMAL LOW (ref 36.0–46.0)
HCT: 37.5 % (ref 36.0–46.0)
HCT: 33.3 % — ABNORMAL LOW (ref 36.0–46.0)
Hemoglobin: 9.8 g/dL — ABNORMAL LOW (ref 12.0–15.0)
Hemoglobin: 10.6 g/dL — ABNORMAL LOW (ref 12.0–15.0)
Hemoglobin: 9.6 g/dL — ABNORMAL LOW (ref 12.0–15.0)
MCH: 22.3 pg — ABNORMAL LOW (ref 26.0–34.0)
MCH: 22.2 pg — ABNORMAL LOW (ref 26.0–34.0)
MCH: 22.6 pg — ABNORMAL LOW (ref 26.0–34.0)
MCHC: 28.3 g/dL — ABNORMAL LOW (ref 30.0–36.0)
MCHC: 28.3 g/dL — ABNORMAL LOW (ref 30.0–36.0)
MCHC: 28.8 g/dL — ABNORMAL LOW (ref 30.0–36.0)
MCV: 78.8 fL — ABNORMAL LOW (ref 80.0–100.0)
MCV: 78.5 fL — ABNORMAL LOW (ref 80.0–100.0)
MCV: 78.5 fL — ABNORMAL LOW (ref 80.0–100.0)
Platelets: 223 10*3/uL (ref 150–400)
Platelets: 273 10*3/uL (ref 150–400)
Platelets: 207 10*3/uL (ref 150–400)
RBC: 4.39 MIL/uL (ref 3.87–5.11)
RBC: 4.78 MIL/uL (ref 3.87–5.11)
RBC: 4.24 MIL/uL (ref 3.87–5.11)
RDW: 15.2 % (ref 11.5–15.5)
RDW: 15.1 % (ref 11.5–15.5)
RDW: 15.1 % (ref 11.5–15.5)
WBC: 5.4 10*3/uL (ref 4.0–10.5)
WBC: 5.9 10*3/uL (ref 4.0–10.5)
WBC: 6.7 10*3/uL (ref 4.0–10.5)
nRBC: 0 % (ref 0.0–0.2)
nRBC: 0 % (ref 0.0–0.2)
nRBC: 0 % (ref 0.0–0.2)

## 2019-08-17 LAB — COMPREHENSIVE METABOLIC PANEL
ALT: 23 U/L (ref 0–44)
AST: 28 U/L (ref 15–41)
Albumin: 3.4 g/dL — ABNORMAL LOW (ref 3.5–5.0)
Alkaline Phosphatase: 71 U/L (ref 38–126)
Anion gap: 9 (ref 5–15)
BUN: 12 mg/dL (ref 6–20)
CO2: 24 mmol/L (ref 22–32)
Calcium: 8.7 mg/dL — ABNORMAL LOW (ref 8.9–10.3)
Chloride: 104 mmol/L (ref 98–111)
Creatinine, Ser: 0.92 mg/dL (ref 0.44–1.00)
GFR calc Af Amer: >60 mL/min (ref >60)
GFR calc non Af Amer: >60 mL/min (ref >60)
Glucose, Bld: 250 mg/dL — ABNORMAL HIGH (ref 70–99)
Potassium: 3.3 mmol/L — ABNORMAL LOW (ref 3.5–5.1)
Sodium: 137 mmol/L (ref 135–145)
Total Bilirubin: 0.6 mg/dL (ref 0.3–1.2)
Total Protein: 6.4 g/dL — ABNORMAL LOW (ref 6.5–8.1)

## 2019-08-17 LAB — SARS CORONAVIRUS 2 (TAT 6-24 HRS): SARS Coronavirus 2: NEGATIVE

## 2019-08-17 LAB — PREPARE RBC (CROSSMATCH)

## 2019-08-17 LAB — CBG MONITORING, ED: Glucose-Capillary: 280 mg/dL — ABNORMAL HIGH (ref 70–99)

## 2019-08-17 LAB — HIV ANTIBODY (ROUTINE TESTING W REFLEX): HIV Screen 4th Generation wRfx: NONREACTIVE

## 2019-08-17 MED ORDER — LISINOPRIL 20 MG PO TABS
20.0000 mg | ORAL_TABLET | Freq: Every day | ORAL | Status: DC
  Administered 2019-08-17: 20 mg via ORAL
  Filled 2019-08-17: qty 1

## 2019-08-17 MED ORDER — AMITRIPTYLINE HCL 50 MG PO TABS
75.0000 mg | ORAL_TABLET | Freq: Every day | ORAL | Status: DC
  Administered 2019-08-17 – 2019-08-26 (×10): 75 mg via ORAL
  Filled 2019-08-17 (×11): qty 1

## 2019-08-17 MED ORDER — INSULIN GLARGINE 100 UNIT/ML ~~LOC~~ SOLN
5.0000 [IU] | Freq: Every day | SUBCUTANEOUS | Status: DC
  Administered 2019-08-17 – 2019-08-18 (×2): 5 [IU] via SUBCUTANEOUS
  Filled 2019-08-17 (×2): qty 0.05

## 2019-08-17 MED ORDER — INSULIN ASPART 100 UNIT/ML ~~LOC~~ SOLN
0.0000 [IU] | Freq: Every day | SUBCUTANEOUS | Status: DC
  Administered 2019-08-18 – 2019-08-20 (×2): 2 [IU] via SUBCUTANEOUS
  Administered 2019-08-21 – 2019-08-22 (×2): 3 [IU] via SUBCUTANEOUS
  Administered 2019-08-24: 4 [IU] via SUBCUTANEOUS
  Administered 2019-08-25: 2 [IU] via SUBCUTANEOUS

## 2019-08-17 MED ORDER — SODIUM CHLORIDE 0.9 % IV SOLN: INTRAVENOUS | Status: DC

## 2019-08-17 MED ORDER — LISINOPRIL-HYDROCHLOROTHIAZIDE 20-12.5 MG PO TABS: 1.0000 | ORAL_TABLET | Freq: Two times a day (BID) | ORAL | Status: DC

## 2019-08-17 MED ORDER — METOPROLOL TARTRATE 12.5 MG HALF TABLET
12.5000 mg | ORAL_TABLET | Freq: Two times a day (BID) | ORAL | Status: DC
  Administered 2019-08-17 – 2019-08-18 (×4): 12.5 mg via ORAL
  Filled 2019-08-17 (×4): qty 1

## 2019-08-17 MED ORDER — ALBUTEROL SULFATE (2.5 MG/3ML) 0.083% IN NEBU: 2.5000 mg | INHALATION_SOLUTION | Freq: Four times a day (QID) | RESPIRATORY_TRACT | Status: DC | PRN

## 2019-08-17 MED ORDER — PANTOPRAZOLE SODIUM 40 MG PO TBEC
40.0000 mg | DELAYED_RELEASE_TABLET | Freq: Two times a day (BID) | ORAL | Status: DC
  Administered 2019-08-17: 40 mg via ORAL
  Filled 2019-08-17: qty 1

## 2019-08-17 MED ORDER — AMLODIPINE BESYLATE 10 MG PO TABS
10.0000 mg | ORAL_TABLET | Freq: Every day | ORAL | Status: DC
  Administered 2019-08-17: 10 mg via ORAL
  Filled 2019-08-17: qty 1

## 2019-08-17 MED ORDER — CLONAZEPAM 0.5 MG PO TABS: 0.5000 mg | ORAL_TABLET | Freq: Every day | ORAL | Status: DC | PRN

## 2019-08-17 MED ORDER — LAMOTRIGINE 100 MG PO TABS
200.0000 mg | ORAL_TABLET | Freq: Every day | ORAL | Status: DC
  Administered 2019-08-17 – 2019-08-27 (×10): 200 mg via ORAL
  Filled 2019-08-17 (×11): qty 2

## 2019-08-17 MED ORDER — ACETAMINOPHEN 650 MG RE SUPP: 650.0000 mg | Freq: Four times a day (QID) | RECTAL | Status: DC | PRN

## 2019-08-17 MED ORDER — INSULIN ASPART 100 UNIT/ML ~~LOC~~ SOLN
0.0000 [IU] | Freq: Three times a day (TID) | SUBCUTANEOUS | Status: DC
  Administered 2019-08-17: 8 [IU] via SUBCUTANEOUS
  Administered 2019-08-17 (×2): 3 [IU] via SUBCUTANEOUS
  Administered 2019-08-18: 5 [IU] via SUBCUTANEOUS
  Administered 2019-08-18: 11 [IU] via SUBCUTANEOUS
  Administered 2019-08-19: 8 [IU] via SUBCUTANEOUS
  Administered 2019-08-19 – 2019-08-20 (×3): 5 [IU] via SUBCUTANEOUS
  Administered 2019-08-20: 8 [IU] via SUBCUTANEOUS
  Administered 2019-08-20: 15 [IU] via SUBCUTANEOUS
  Administered 2019-08-21: 3 [IU] via SUBCUTANEOUS
  Administered 2019-08-21 – 2019-08-22 (×2): 8 [IU] via SUBCUTANEOUS
  Administered 2019-08-22: 5 [IU] via SUBCUTANEOUS
  Administered 2019-08-22: 8 [IU] via SUBCUTANEOUS
  Administered 2019-08-23 – 2019-08-24 (×4): 5 [IU] via SUBCUTANEOUS
  Administered 2019-08-24: 3 [IU] via SUBCUTANEOUS
  Administered 2019-08-25: 12:00:00 8 [IU] via SUBCUTANEOUS
  Administered 2019-08-25 (×2): 5 [IU] via SUBCUTANEOUS
  Administered 2019-08-26: 8 [IU] via SUBCUTANEOUS
  Administered 2019-08-26: 3 [IU] via SUBCUTANEOUS
  Administered 2019-08-26: 5 [IU] via SUBCUTANEOUS
  Administered 2019-08-27: 11 [IU] via SUBCUTANEOUS
  Administered 2019-08-27: 15 [IU] via SUBCUTANEOUS

## 2019-08-17 MED ORDER — PANTOPRAZOLE SODIUM 40 MG IV SOLR
40.0000 mg | Freq: Two times a day (BID) | INTRAVENOUS | Status: DC
  Administered 2019-08-17 – 2019-08-18 (×3): 40 mg via INTRAVENOUS
  Filled 2019-08-17 (×4): qty 40

## 2019-08-17 MED ORDER — HYDROMORPHONE HCL 1 MG/ML IJ SOLN
0.5000 mg | INTRAMUSCULAR | Status: DC | PRN
  Administered 2019-08-17 – 2019-08-27 (×8): 1 mg via INTRAVENOUS
  Filled 2019-08-17 (×8): qty 1

## 2019-08-17 MED ORDER — TRAZODONE HCL 100 MG PO TABS
100.0000 mg | ORAL_TABLET | Freq: Every day | ORAL | Status: DC
  Administered 2019-08-17 – 2019-08-26 (×10): 100 mg via ORAL
  Filled 2019-08-17 (×10): qty 1

## 2019-08-17 MED ORDER — LURASIDONE HCL 20 MG PO TABS
60.0000 mg | ORAL_TABLET | Freq: Every day | ORAL | Status: DC
  Administered 2019-08-17 – 2019-08-25 (×9): 60 mg via ORAL
  Filled 2019-08-17 (×10): qty 3

## 2019-08-17 MED ORDER — POTASSIUM CHLORIDE 10 MEQ/100ML IV SOLN
10.0000 meq | INTRAVENOUS | Status: AC
  Administered 2019-08-17 (×3): 10 meq via INTRAVENOUS
  Filled 2019-08-17 (×3): qty 100

## 2019-08-17 MED ORDER — HYDROCHLOROTHIAZIDE 12.5 MG PO CAPS
12.5000 mg | ORAL_CAPSULE | Freq: Every day | ORAL | Status: DC
  Administered 2019-08-17: 12.5 mg via ORAL
  Filled 2019-08-17: qty 1

## 2019-08-17 MED ORDER — ACETAMINOPHEN 325 MG PO TABS
650.0000 mg | ORAL_TABLET | Freq: Four times a day (QID) | ORAL | Status: DC | PRN
  Administered 2019-08-17 – 2019-08-24 (×6): 650 mg via ORAL
  Filled 2019-08-17 (×6): qty 2

## 2019-08-17 MED ORDER — ONDANSETRON HCL 4 MG/2ML IJ SOLN
4.0000 mg | Freq: Four times a day (QID) | INTRAMUSCULAR | Status: DC | PRN
  Administered 2019-08-17 – 2019-08-18 (×2): 4 mg via INTRAVENOUS
  Filled 2019-08-17 (×2): qty 2

## 2019-08-17 MED ORDER — ONDANSETRON HCL 4 MG PO TABS: 4.0000 mg | ORAL_TABLET | Freq: Four times a day (QID) | ORAL | Status: DC | PRN

## 2019-08-17 MED ORDER — SODIUM CHLORIDE 0.9% IV SOLUTION: Freq: Once | INTRAVENOUS | Status: AC

## 2019-08-17 NOTE — ED Notes (Signed)
Pt sitting up in bed watching t.v. NAD noted. Full monitor on. MD at  Bedside.

## 2019-08-17 NOTE — Progress Notes (Signed)
PROGRESS NOTE    Monique Holland  ELF:[id number] DOB: [birth year] DOA: 08/16/2019 PCP: Kristie Cowman, MD   Brief Narrative: 54 year old with past medical history significant for ID DM2, hypertension, gastritis, chronic pancreatitis with pseudocyst, morbid obesity who presents complaining of abdominal pain and vomiting a small amount of blood.  -Patient last hospitalization from 08/02/2019 130 07/1618 21 for epigastric abdominal pain nausea vomiting thought to be secondary to gastritis esophagitis.  Renal admission she had an MRCP which showed 12.6 cm pseudocyst, unchanged from recent CT scan. 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     Assessment & Plan:   Active Problems:   AKI (acute kidney injury) ([place])   Type 2 diabetes mellitus (HCC)   Schizophrenia (HCC)   Obesity, Class III, BMI 40-49.9 (morbid obesity) ([place])   Acute pancreatitis   Pancreatic pseudocyst   Abdominal pain   Essential hypertension  1-Acute on chronic abdominal pain, chronic pancreatitis, pancreatic pseudocyst: Continue n.p.o. status. Continue with IV fluids. Continue with IV pain meds and Zofran as needed for nausea. GI has been consulted. Wonder  if she required drainage of the pancreatic pseudocyst.   2-Hematemesis, questionable melena  Change Protonix to IV twice daily. Check hemoglobin every 8 hours. No need for blood transfusion at this time. GI has been consulted History of Candida esophagitis: She should have  completed treatment.  3-Diarrhea: We will consider start pancreatic enzymes. Hypokalemia: Replete IV AKI; hypovolemia.  Resolved.  Diabetes type 2 insulin-dependent CBG elevated at 300 range. Continue with low-dose Lantus. Continue with sliding scale  insulin.  Asthma: Continue with albuterol Hypertension; hold diuretics and amlodipine due to vomiting. Anxiety: Continue with clonazepam as needed Depression: Continue with amitriptyline and trazodone Schizophrenia BPD: Continue with Lamictal and Latuda    Estimated body mass index is 45.06 kg/m as calculated from the following:   Height as of this encounter: 5\' 9"  (1.753 m).   Weight as of this encounter: 138.4 kg.   DVT prophylaxis: SCD Code Status: Full code Family Communication: Care discussed directly with patient Disposition Plan:  Patient is from: Home Anticipated d/c date: Home in 2 to 3 days after resolution of GI bleed and when patient is able to tolerate diet Barriers to d/c or necessity for inpatient status: Patient with persistent vomiting, hematemesis.  Continue with fluids.  Await GI consultation  Consultants:   GI  Procedures:   None  Antimicrobials:  None  Subjective: Patient with persistent nausea and vomiting, she is vomited a small amount of undigested fluid with mild redness  Objective: Vitals:   08/17/19 0529 08/17/19 0921 08/17/19 0923 08/17/19 1330  BP: (!) 154/107 (!) 147/109  (!) 142/98  Pulse: 99  98 96  Resp: 18   18  Temp: 97.7 F (36.5 C)   98 F (36.7 C)  TempSrc: Oral   Oral  SpO2: 98%   100%  Weight: (!) 138.4 kg     Height: 5\' 9"  (1.753 m)       Intake/Output Summary (Last 24 hours) at 08/17/2019 1420 Last data filed at 08/17/2019 1300 Gross per 24 hour  Intake 260 ml  Output --  Net 260 ml   Filed Weights   08/17/19 0529  Weight: (!) 138.4 kg    Examination:  General exam: Appears calm and comfortable  Respiratory system: Clear to  auscultation. Respiratory effort normal. Cardiovascular system: S1 & S2 heard, RRR. No JVD, murmurs, rubs, gallops or clicks. No pedal edema. Gastrointestinal system: Abdomen is nondistended, soft and tender. No organomegaly or masses felt. Normal bowel sounds heard. Central nervous  system: Alert and oriented. No focal neurological deficits. Extremities: Symmetric 5 x 5 power. Skin: No rashes, lesions or ulcers   Data Reviewed: I have personally reviewed following labs and imaging studies  CBC: Recent Labs  Lab 08/16/19 1920 08/17/19 0522 08/17/19 1229  WBC 7.9 5.4 5.9  NEUTROABS 4.1  --   --   HGB 10.5* 9.8* 10.6*  HCT 36.0 34.6* 37.5  MCV 78.9* 78.8* 78.5*  PLT 297 223 273   Basic Metabolic Panel: Recent Labs  Lab 08/16/19 1920 08/17/19 0522  NA 134* 137  K 3.6 3.3*  CL 98 104  CO2 24 24  GLUCOSE 403* 250*  BUN 11 12  CREATININE 1.10* 0.92  CALCIUM 9.3 8.7*   GFR: Estimated Creatinine Clearance: 107.4 mL/min (by C-G formula based on SCr of 0.92 mg/dL). Liver Function Tests: Recent Labs  Lab 08/16/19 1920 08/17/19 0522  AST 34 28  ALT 25 23  ALKPHOS 77 71  BILITOT 0.7 0.6  PROT 7.0 6.4*  ALBUMIN 3.7 3.4*   Recent Labs  Lab 08/16/19 1920  LIPASE 35   No results for input(s): AMMONIA in the last 168 hours. Coagulation Profile: No results for input(s): INR, PROTIME in the last 168 hours. Cardiac Enzymes: No results for input(s): CKTOTAL, CKMB, CKMBINDEX, TROPONINI in the last 168 hours. BNP (last 3 results) No results for input(s): PROBNP in the last 8760 hours. HbA1C: No results for input(s): HGBA1C in the last 72 hours. CBG: Recent Labs  Lab 08/16/19 2230 08/17/19 0016 08/17/19 0722 08/17/19 1201  GLUCAP 315* 280* 280* 177*   Lipid Profile: No results for input(s): CHOL, HDL, LDLCALC, TRIG, CHOLHDL, LDLDIRECT in the last 72 hours. Thyroid Function Tests: No results for input(s): TSH, T4TOTAL, FREET4, T3FREE, THYROIDAB in the last 72 hours. Anemia Panel: No results for input(s): VITAMINB12, FOLATE, FERRITIN, TIBC, IRON, RETICCTPCT in the last 72 hours. Sepsis Labs: No results for input(s): PROCALCITON, LATICACIDVEN in the last 168 hours.  Recent Results (from the past 240 hour(s))  SARS CORONAVIRUS 2 (TAT 6-24 HRS)  Nasopharyngeal Nasopharyngeal Swab     Status: None   Collection Time: 08/17/19  2:10 AM   Specimen: Nasopharyngeal Swab  Result Value Ref Range Status   SARS Coronavirus 2 NEGATIVE NEGATIVE Final    Comment: (NOTE) SARS-CoV-2 target nucleic acids are NOT DETECTED. The SARS-CoV-2 RNA is generally detectable in upper and lower respiratory specimens during the acute phase of infection. Negative results do not preclude SARS-CoV-2 infection, do not rule out co-infections with other pathogens, and should not be used as the sole basis for treatment or other patient management decisions. Negative results must be combined with clinical observations, patient history, and epidemiological information. The expected result is Negative. Fact Sheet for Patients: HairSlick.no Fact Sheet for Healthcare Providers: quierodirigir.com This test is not yet approved or cleared by the [place] FDA and  has been authorized for detection and/or diagnosis of SARS-CoV-2 by FDA under an Emergency Use Authorization (EUA). This EUA will remain  in effect (meaning this test can be used) for the duration of the COVID-19 declaration under Section 56 4(b)(1) of the Act, 21 U.S.C. section 360bbb-3(b)(1), unless the authorization is terminated or revoked sooner. Performed at [facility], [address] [place], [place] [address]          Radiology Studies: CT ABDOMEN PELVIS W CONTRAST  Result Date: 08/17/2019 CLINICAL DATA:  Recurrent left upper quadrant abdominal pain. Nausea and vomiting. Recent diagnosis of necrotizing pancreatitis. EXAM: CT ABDOMEN AND PELVIS WITH CONTRAST TECHNIQUE: Multidetector CT imaging of the abdomen and pelvis was performed using the standard protocol following bolus administration of intravenous contrast. CONTRAST:  OMNIPAQUE IOHEXOL 300 MG/ML  SOLN COMPARISON:  Most recent CT 07/29/2019. FINDINGS: Lower chest: Left  lower lobe atelectasis with trace left pleural effusion. Hepatobiliary: No focal hepatic lesion. There are slightly prominent size. Gallbladder partially distended. No calcified gallstone. Slight common bile duct prominence a 7 mm. Pancreas: Peripancreatic fluid collection extending along the body and tail, dominant collection measuring 12.3 x 6.7 cm, not significantly changed from prior exam. Adjacent collection measures 5.9 x 5.2 cm, also not significantly changed. A 2.5 cm collection in the left upper quadrant may be contiguous with the smaller pseudocysts. No air within either of these collections. Mild generalized fat stranding which is grossly stable. No new peripancreatic fluid collection. Calcifications at the pancreatic head again seen. Pancreatic duct not well defined. Spleen: Upper normal in size spanning 13 cm. No splenic infarct or focal abnormality. Adrenals/Urinary Tract: No adrenal nodule. No hydronephrosis. Similar bilateral perinephric edema. Multiple calcifications within bilateral renal pyramids likely combination of nonobstructing stones and nephrocalcinosis. No ureteral calculi. Urinary bladder is partially distended. Stomach/Bowel: Portions of the stomach about the pancreatic pseudocyst. No abnormal gastric distension. No small bowel inflammation or obstruction. Prior appendectomy. The splenic flexure abuts the pancreatic pseudocyst in the left upper quadrant with mild adjacent wall thickening, likely reactive. Small volume of colonic stool. Vascular/Lymphatic: Aortic atherosclerosis. No aortic aneurysm. The portal vein is patent. Splenic vein is attenuated not definitely included. There a few prominent left upper quadrant collaterals. No adenopathy. Reproductive: Uterus and bilateral adnexa are unremarkable. Other: Peripancreatic fluid collections as described. No new intra-abdominal fluid collection. No ascites. Scattered subcutaneous densities in the anterior abdominal wall suggesting  medication injection sites. There is no free air. Musculoskeletal: Degenerative change in the lumbar spine. 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 IMPRESSION: 1. Unchanged pancreatic pseudocysts, largest collection in the left upper quadrant measures 12.3 x 6.7 cm, not significantly changed from exam earlier this month. No air within either of these collections. Mild residual stranding which has slightly improved. No new fluid collection. 2. The stomach and splenic flexure of the colon about these pancreatic collections with mild wall thickening, likely reactive. 3. Sequela of chronic pancreatitis with pancreatic head calcifications. 4. Bilateral nephrolithiasis and nephrocalcinosis. 5. Left lower lobe atelectasis with trace left pleural effusion. 6. Additional stable chronic findings as described. Aortic Atherosclerosis (ICD10-I70.0). Electronically Signed   By: Narda Rutherford M.D.   On: 08/17/2019 00:21        Scheduled Meds: . amitriptyline  75 mg Oral QHS  . insulin aspart  0-15 Units Subcutaneous TID WC  . insulin aspart  0-5 Units Subcutaneous QHS  . insulin glargine  5 Units Subcutaneous Daily  . lamoTRIgine  200 mg Oral Daily  . lurasidone  60 mg Oral QHS  . metoprolol tartrate  12.5 mg Oral BID  . pantoprazole (PROTONIX) IV  40 mg Intravenous Q12H  . traZODone  100 mg Oral QHS   Continuous Infusions: . sodium chloride 100 mL/hr at 08/17/19 1017     LOS: 0 days    Time spent: 35  Minutes.     Alba Cory, MD [facility]   If 7PM-7AM, please contact night-coverage [email or web address]  08/17/2019, 2:20 PM

## 2019-08-17 NOTE — H&P (View-Only) (Signed)
Referring Provider: No ref. provider found Primary Care Physician:  Kristie Cowman, MD Primary Gastroenterologist: Dr. Lyndel Safe  Reason for Consultation:  Hematemesis, abdominal pain, pancreatic pseuodocyts  HPI: Monique Holland is a 54 y.o. female with a past medical history significant for anxiety, depression, schizophrenia,  asthma, attention,  diabetes mellitus type 2, chronic pancreatitis with a pancreatic pseudocyst.  In review of her records, she had a prolonged hospitalization in [place] at [facility], 06/18/2019 through 07/10/2019. She was admitted with severe acute necrotizing pancreatitis, and nonocclusive splenic vein thrombosis and also found to have hypercalcemia felt secondary to hyperparathyroidism. She underwent left lower lobe parathyroidectomy on 07/04/2019 while hospitalized. She was discharged with nocturnal tube feeds via nasoenteric tube.   She presented to [place] ED on 07/17/2019 with nausea and hematemesis.  She reported having poor p.o. intake since being discharged from the hospital with vomiting on a daily basis.  Hemoglobin level was 9.2.  Sodium 129.  LFTs were normal.  Abdominal/pelvic CT showed fluid collections involving the pancreatic head body and tail the largest is multiloculated and extending from the head to the pancreatic body measuring 7.9 x 12.2 x 10.4 cm There is an additional fluid collection measuring 5.2 x 6.7 x 6.3 cm along the pancreatic tail, extensive peripancreatic inflammation.  Slight narrowing of the splenic vein without complete occlusion or thrombus formation.  Several edematous loops of small bowel and thickening at the splenic flexure of the colon was noted.  She was transferred to [facility] for admission. She   underwent an EGD by Dr. Bryan Lemma 07/18/2019 which identified candidiasis esophagitis, and gastritis.  The duodenum was normal. She was prescribed Diflucan for her candidiasis esophagitis, PPI continued and she was discharged  home on 07/21/2019. Possible endoscopic cystgastrostomy was standard for her pancreatic pseudocyst management.  She was readmitted to the hospital 08/02/2019 with complaints of nausea, vomiting and epigastric pain.  Laboratory studies were unremarkable.  An abdominal MRI/MRCP showed stable peripancreatic fluid collections which did not appear to be compressing on her stomach.  She was discharged home on 08/06/2019.   Her upper abdominal pain never completely resolved.  However, she denied having any further nausea or vomiting until yesterday.  She developed watery nonbloody dark brown diarrhea, possibly black in color early yesterday morning followed by epigastric pain then later vomited partially undigested food with a moderate amount of bright red blood x3 episodes.She presented to [facility] ED on 08/16/2019 further evaluation.  Currently, she is ambulating up in her room.  She denies having any nausea or vomiting.  She continues to have upper abdominal pain which is not severe at this time.  No heartburn or dysphagia.  She takes Pantoprazole 40 mg p.o. twice daily. No diarrhea since arriving to the ED.  She reported having a colonoscopy by Dr. Alan Ripper at [facility] in [facility] that was normal, she had hemorrhoids banded. I will request a copy of the colonoscopy report. NSAID use.  No alcohol use.   ED course 08/16/2019: Sodium 134.  Potassium 3.6.  Glucose 403.  BUN 11.  Creatinine 1.10.  Anion gap 12.  Alk phos 77.  AST 34.  ALT 25.  Albumin 3.7.  Lipase 5.  Total bili 0.7.  WBC 7.9.  Hemoglobin 10.5.  Hematocrit 36.  MCV 78.9.  Platelet 297.  Abdominal/pelvic CT with contrast 08/17/2019:  1. Unchanged pancreatic pseudocysts, largest collection in the left upper quadrant measures 12.3 x 6.7 cm, not significantly changed from  exam earlier this month. No air within either of these collections. Mild residual stranding which has slightly improved. No new fluid collection. 2. The  stomach and splenic flexure of the colon about these pancreatic collections with mild wall thickening, likely reactive. 3. Sequela of chronic pancreatitis with pancreatic head calcifications. 4. Bilateral nephrolithiasis and nephrocalcinosis. 5. Left lower lobe atelectasis with trace left pleural effusion. 6. Additional stable chronic findings as described. Aortic Atherosclerosis   Abdominal MRI/MRCP 08/03/2019: Sequela of prior/chronic pancreatitis, including a dominant 12.6 cm fluid collection/pseudocyst along the pancreatic body/tail, unchanged from recent CT. Mild peripancreatic inflammatory changes/fluid. No cholelithiasis or choledocholithiasis is evident on MR.   Past Medical History:  Diagnosis Date  . Anxiety   . Asthma   . Depression   . Diabetes mellitus without complication ([place])   . Frequency of urination   . GERD (gastroesophageal reflux disease)   . History of asthma    last episode yrs ago  . Hypertension   . OSA (obstructive sleep apnea)    pt had study done oct 2014--  schedule for cpap titrate after kidney stone surgery  . Pancreatitis   . Schizophrenia ([place])   . Ureteral calculi    BILATERAL  . Wears glasses     Past Surgical History:  Procedure Laterality Date  . BIOPSY  07/18/2019   Procedure: BIOPSY;  Surgeon: Lavena Bullion, DO;  Location: [facility];  Service: Gastroenterology;;  . [place]  &  2002   w/ bilateral tubal ligation in 2002  . CYSTOSCOPY W/ URETERAL STENT PLACEMENT Right 08/10/2018   Procedure: CYSTOSCOPY WITH RETROGRADE PYELOGRAM/URETERAL STENT PLACEMENT;  Surgeon: Festus Aloe, MD;  Location: [facility];  Service: Urology;  Laterality: Right;  . CYSTOSCOPY WITH RETROGRADE PYELOGRAM, URETEROSCOPY AND STENT PLACEMENT Bilateral 05/02/2013   Procedure: CYSTOSCOPY WITH RETROGRADE PYELOGRAM, URETEROSCOPY ;  Surgeon: Bernestine Amass, MD;  Location: [facility];  Service: Urology;  Laterality: Bilateral;   . CYSTOSCOPY WITH RETROGRADE PYELOGRAM, URETEROSCOPY AND STENT PLACEMENT Right 08/15/2018   Procedure: CYSTOSCOPY WITH RIGHT RETROGRADE PYELOGRAM, RIGHT URETEROSCOPY WITH HOLMIUM LASER AND STENT PLACEMENT;  Surgeon: Lucas Mallow, MD;  Location: [facility];  Service: Urology;  Laterality: Right;  . CYSTOSCOPY WITH STENT PLACEMENT Left 05/02/2013   Procedure: CYSTOSCOPY WITH STENT PLACEMENT;  Surgeon: Bernestine Amass, MD;  Location: [facility];  Service: Urology;  Laterality: Left;  . ESOPHAGOGASTRODUODENOSCOPY (EGD) WITH PROPOFOL N/A 07/18/2019   Procedure: ESOPHAGOGASTRODUODENOSCOPY (EGD) WITH PROPOFOL;  Surgeon: Lavena Bullion, DO;  Location: [facility];  Service: Gastroenterology;  Laterality: N/A;  . HOLMIUM LASER APPLICATION Bilateral [id number]   Procedure: HOLMIUM LASER APPLICATION;  Surgeon: Bernestine Amass, MD;  [facility];  Service: Urology;  Laterality: Bilateral;  . LAPAROSCOPIC APPENDECTOMY N/A 05/22/2014   Procedure: APPENDECTOMY LAPAROSCOPIC;  Surgeon: Excell Seltzer, MD;  Location: [facility];  Service: General;  Laterality: N/A;  . TONSILLECTOMY  age 54    Prior to Admission medications   Medication Sig Start Date End Date Taking? Authorizing Provider  albuterol (VENTOLIN HFA) 108 (90 Base) MCG/ACT inhaler Inhale 1-2 puffs into the lungs every 6 (six) hours as needed for wheezing or shortness of breath. 07/21/19  Yes Regalado, Belkys A, MD  amitriptyline (ELAVIL) 75 MG tablet Take 75 mg by mouth at bedtime. 08/10/19  Yes [provider]  amLODipine (NORVASC) 10 MG tablet Take 10 mg by mouth daily. 08/09/19  Yes [provider]  clonazePAM (  KLONOPIN) 0.5 MG tablet Take 0.5 mg by mouth daily as needed for anxiety. 05/23/19  Yes [provider]  lamoTRIgine (LAMICTAL) 200 MG tablet Take 200 mg by mouth daily. 06/06/19  Yes [provider]  LANTUS SOLOSTAR 100 UNIT/ML Solostar Pen Inject 15 Units into the  skin at bedtime. Patient taking differently: Inject 30 Units into the skin at bedtime.  07/21/19  Yes Regalado, Belkys A, MD  LATUDA 60 MG TABS Take 1 tablet by mouth at bedtime. 08/10/19  Yes [provider]  lisinopril-hydrochlorothiazide (ZESTORETIC) 20-12.5 MG tablet Take 1 tablet by mouth 2 (two) times daily. 07/31/19  Yes [provider]  metoprolol tartrate (LOPRESSOR) 25 MG tablet Take 0.5 tablets (12.5 mg total) by mouth 2 (two) times daily. 08/05/19  Yes Georgette Shell, MD  ondansetron (ZOFRAN) 8 MG tablet Take 8 mg by mouth every 8 (eight) hours as needed for nausea or vomiting.  07/26/19  Yes [provider]  Oxycodone HCl 10 MG TABS Take 10 mg by mouth 4 (four) times daily as needed (pain).  08/12/19  Yes [provider]  pantoprazole (PROTONIX) 40 MG tablet Take 1 tablet (40 mg total) by mouth 2 (two) times daily. 08/05/19 09/04/19 Yes Georgette Shell, MD  PREMARIN vaginal cream Place 1 Applicatorful vaginally at bedtime.  08/10/19  Yes [provider]  senna (SENOKOT) 8.6 MG TABS tablet Take 1 tablet (8.6 mg total) by mouth 2 (two) times daily. 07/21/19  Yes Regalado, Belkys A, MD  SYMBICORT 160-4.5 MCG/ACT inhaler Inhale 2 puffs into the lungs 2 (two) times daily as needed (SOB/wheezing).  04/26/19  Yes [provider]  traZODone (DESYREL) 100 MG tablet Take 100 mg by mouth at bedtime.   Yes [provider]  TRULICITY 4.5 GB/1.5VV SOPN Inject 4.5 mg into the skin once a week. Take on Wed 07/29/19   [provider]    Current Facility-Administered Medications  Medication Dose Route Frequency Provider Last Rate Last Admin  . 0.9 %  sodium chloride infusion   Intravenous Continuous Regalado, Belkys A, MD 100 mL/hr at 08/17/19 1017 Rate Change at 08/17/19 1017  . acetaminophen (TYLENOL) tablet 650 mg  650 mg Oral Q6H PRN Clarnce Flock, MD   650 mg at 08/17/19 6160   Or  . acetaminophen (TYLENOL) suppository 650  mg  650 mg Rectal Q6H PRN Clarnce Flock, MD      . albuterol (PROVENTIL) (2.5 MG/3ML) 0.083% nebulizer solution 2.5 mg  2.5 mg Inhalation Q6H PRN Clarnce Flock, MD      . amitriptyline (ELAVIL) tablet 75 mg  75 mg Oral QHS Clarnce Flock, MD      . clonazePAM Bobbye [place]) tablet 0.5 mg  0.5 mg Oral Daily PRN Clarnce Flock, MD      . HYDROmorphone (DILAUDID) injection 0.5-1 mg  0.5-1 mg Intravenous Q2H PRN Clarnce Flock, MD   1 mg at 08/17/19 0530  . insulin aspart (novoLOG) injection 0-15 Units  0-15 Units Subcutaneous TID WC Clarnce Flock, MD   3 Units at 08/17/19 1245  . insulin aspart (novoLOG) injection 0-5 Units  0-5 Units Subcutaneous QHS Clarnce Flock, MD      . insulin glargine (LANTUS) injection 5 Units  5 Units Subcutaneous Daily Clarnce Flock, MD   5 Units at 08/17/19 [phone]  . lamoTRIgine (LAMICTAL) tablet 200 mg  200 mg Oral Daily Clarnce Flock, MD   200 mg at 08/17/19 0626  .  lurasidone (LATUDA) tablet 60 mg  60 mg Oral QHS Clarnce Flock, MD      . metoprolol tartrate (LOPRESSOR) tablet 12.5 mg  12.5 mg Oral BID Clarnce Flock, MD   12.5 mg at 08/17/19 7124  . ondansetron (ZOFRAN) tablet 4 mg  4 mg Oral Q6H PRN Clarnce Flock, MD       Or  . ondansetron [facility]) injection 4 mg  4 mg Intravenous Q6H PRN Clarnce Flock, MD   4 mg at 08/17/19 0831  . pantoprazole (PROTONIX) injection 40 mg  40 mg Intravenous Q12H Regalado, Belkys A, MD      . traZODone (DESYREL) tablet 100 mg  100 mg Oral QHS Clarnce Flock, MD        Allergies as of 08/16/2019  . (No Known Allergies)    Family History  Family history unknown: Yes    Social History   Socioeconomic History  . Marital status: Married    Spouse name: Not on file  . Number of children: Not on file  . Years of education: Not on file  . Highest education level: Not on file  Occupational History  . Not on file  Tobacco Use  . Smoking status: Never Smoker  .  Smokeless tobacco: Never Used  Substance and Sexual Activity  . Alcohol use: No  . Drug use: No  . Sexual activity: Not on file  Other Topics Concern  . Not on file  Social History Narrative  . Not on file   Social Determinants of Health   Financial Resource Strain:   . Difficulty of Paying Living Expenses:   Food Insecurity:   . Worried About Charity fundraiser in the Last Year:   . Arboriculturist in the Last Year:   Transportation Needs:   . Film/video editor (Medical):   Marland Kitchen Lack of Transportation (Non-Medical):   Physical Activity:   . Days of Exercise per Week:   . Minutes of Exercise per Session:   Stress:   . Feeling of Stress :   Social Connections:   . Frequency of Communication with Friends and Family:   . Frequency of Social Gatherings with Friends and Family:   . Attends Religious Services:   . Active Member of Clubs or Organizations:   . Attends Archivist Meetings:   Marland Kitchen Marital Status:   Intimate Partner Violence:   . Fear of Current or Ex-Partner:   . Emotionally Abused:   Marland Kitchen Physically Abused:   . Sexually Abused:     Review of Systems: See HPI, all other systems reviewed and are negative  Physical Exam: Vital signs in last 24 hours: Temp:  [97.7 F (36.5 C)-99.1 F (37.3 C)] 97.7 F (36.5 C) (03/27 0529) Pulse Rate:  [94-104] 98 (03/27 0923) Resp:  [10-29] 18 (03/27 0529) BP: (128-154)/(85-109) 147/109 (03/27 0921) SpO2:  [87 %-100 %] 98 % (03/27 0529) Weight:  [138.4 kg] 138.4 kg (03/27 0529) Last BM Date: 08/16/19 General:  Alert,  well-developed, well-nourished, pleasant and cooperative in NAD. Head:  Normocephalic and atraumatic. Eyes:  No scleral icterus. Conjunctiva pink. Ears:  Normal auditory acuity. Nose:  No deformity, discharge or lesions. Mouth: Dentition intact. No ulcers or lesions.  Neck:  Supple. No lymphadenopathy or thyromegaly.  Lungs: Breath sounds clear throughout. Heart: Regular rate and rhythm, no  murmurs. Abdomen: Soft, nondistended.  Mild epigastric and left upper quadrant tenderness without rebound or guarding.  Positive bowel sounds  all 4 quadrants.  No obvious HSM in the setting of an obese abdomen. Rectal:  Deferred. Msk:  Symmetrical without gross deformities.  Pulses:  Normal pulses noted. Extremities:  Without clubbing or edema. Neurologic:  Alert and  oriented x4. No focal deficits.  Skin:  Intact without significant lesions or rashes. Psych:  Alert and cooperative. Normal mood and affect.  Intake/Output from previous day: 03/26 0701 - 03/27 0700 In: 260 [I.V.:260] Out: -  Intake/Output this shift: No intake/output data recorded.  Lab Results: Recent Labs    08/16/19 1920 08/17/19 0522 08/17/19 1229  WBC 7.9 5.4 5.9  HGB 10.5* 9.8* 10.6*  HCT 36.0 34.6* 37.5  PLT 297 223 273   BMET Recent Labs    08/16/19 1920 08/17/19 0522  NA 134* 137  K 3.6 3.3*  CL 98 104  CO2 24 24  GLUCOSE 403* 250*  BUN 11 12  CREATININE 1.10* 0.92  CALCIUM 9.3 8.7*   LFT Recent Labs    08/17/19 0522  PROT 6.4*  ALBUMIN 3.4*  AST 28  ALT 23  ALKPHOS 71  BILITOT 0.6   PT/INR No results for input(s): LABPROT, INR in the last 72 hours. Hepatitis Panel No results for input(s): HEPBSAG, HCVAB, HEPAIGM, HEPBIGM in the last 72 hours.    Studies/Results: CT ABDOMEN PELVIS W CONTRAST  Result Date: 08/17/2019 CLINICAL DATA:  Recurrent left upper quadrant abdominal pain. Nausea and vomiting. Recent diagnosis of necrotizing pancreatitis. EXAM: CT ABDOMEN AND PELVIS WITH CONTRAST TECHNIQUE: Multidetector CT imaging of the abdomen and pelvis was performed using the standard protocol following bolus administration of intravenous contrast. CONTRAST:  119m OMNIPAQUE IOHEXOL 300 MG/ML  SOLN COMPARISON:  Most recent CT 07/29/2019. FINDINGS: Lower chest: Left lower lobe atelectasis with trace left pleural effusion. Hepatobiliary: No focal hepatic lesion. There are slightly prominent  size. Gallbladder partially distended. No calcified gallstone. Slight common bile duct prominence a 7 mm. Pancreas: Peripancreatic fluid collection extending along the body and tail, dominant collection measuring 12.3 x 6.7 cm, not significantly changed from prior exam. Adjacent collection measures 5.9 x 5.2 cm, also not significantly changed. A 2.5 cm collection in the left upper quadrant may be contiguous with the smaller pseudocysts. No air within either of these collections. Mild generalized fat stranding which is grossly stable. No new peripancreatic fluid collection. Calcifications at the pancreatic head again seen. Pancreatic duct not well defined. Spleen: Upper normal in size spanning 13 cm. No splenic infarct or focal abnormality. Adrenals/Urinary Tract: No adrenal nodule. No hydronephrosis. Similar bilateral perinephric edema. Multiple calcifications within bilateral renal pyramids likely combination of nonobstructing stones and nephrocalcinosis. No ureteral calculi. Urinary bladder is partially distended. Stomach/Bowel: Portions of the stomach about the pancreatic pseudocyst. No abnormal gastric distension. No small bowel inflammation or obstruction. Prior appendectomy. The splenic flexure abuts the pancreatic pseudocyst in the left upper quadrant with mild adjacent wall thickening, likely reactive. Small volume of colonic stool. Vascular/Lymphatic: Aortic atherosclerosis. No aortic aneurysm. The portal vein is patent. Splenic vein is attenuated not definitely included. There a few prominent left upper quadrant collaterals. No adenopathy. Reproductive: Uterus and bilateral adnexa are unremarkable. Other: Peripancreatic fluid collections as described. No new intra-abdominal fluid collection. No ascites. Scattered subcutaneous densities in the anterior abdominal wall suggesting medication injection sites. There is no free air. Musculoskeletal: Degenerative change in the lumbar spine. 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 IMPRESSION: 1. Unchanged pancreatic pseudocysts, largest collection in the left upper quadrant  measures 12.3 x 6.7 cm, not significantly changed from exam earlier this month. No air within either of these collections. Mild residual stranding which has slightly improved. No new fluid collection. 2. The stomach and splenic flexure of the colon about these pancreatic collections with mild wall thickening, likely reactive. 3. Sequela of chronic pancreatitis with pancreatic head calcifications. 4. Bilateral nephrolithiasis and nephrocalcinosis. 5. Left lower lobe atelectasis with trace left pleural effusion. 6. Additional stable chronic findings as described. Aortic Atherosclerosis (ICD10-I70.0). Electronically Signed   By: Keith Rake M.D.   On: 08/17/2019 00:21    IMPRESSION/PLAN:  28.  54 year old female with a history of pancreatitis with pancreatic pseudocyst presents to the ED 3/26 with nausea, hematemesis and upper abdominal pain an diarrhea ? melena.  CT abdomen pelvis 3/27 showed stable pancreatic pseudocyst, sequela of chronic pancreatitis with pancreatic head calcifications.  The stomach and splenic flexure of the colon with mild wall thickening.  -EGD with Dr.  Henrene Pastor  08/18/2019.  EGD benefits and risk discussed including risk of sedation, risk of bleeding, perforation and infection -Clear liquid diet then n.p.o. after midnight -Possible cyst gastrostomy in the near future as pancreatic pseudocyst are stable without significant improvement -Continue normal saline at 100 cc an hour -Pain management per the hospitalist -Ondansetron 4 mg p.o. IV every 6 hours as needed -Check GI panel if diarrhea recurs  -PPI 26m IV bid  -[facility]  Chronic microcytic anemia.  Hemoglobin 10.6 up from 9.8.  MCV 78.5. -Repeat CBC, iron panel in a.m.  3. DM on insulin.   Hyperglycemia.  Admission glucose 403.  Further recommendations per Dr. PVerta EllenMDorathy Daft 08/17/2019, 1:14 PM  GI ATTENDING  History,labs,x-rays,previous hospital stay and endoscopy report reviewed. Agree with above consultation note as outlined. Plans for EGD tomorrow to evaluate hematemesis from yesterday. Looks well this afternoon save epigastric pain with palpation. The patient is high risk given her body habitus and diabetes.The nature of the procedure, as well as the risks, benefits, and alternatives were carefully and thoroughly reviewed with the patient. Ample time for discussion and questions allowed. The patient understood, was satisfied, and agreed to proceed.  JDocia Chuck PGeri Seminole, M.D. [facility]Harry [facility]

## 2019-08-17 NOTE — Consult Note (Addendum)
Referring Provider: No ref. provider found Primary Care Physician:  Kristie Cowman, MD Primary Gastroenterologist: Dr. Lyndel Safe  Reason for Consultation:  Hematemesis, abdominal pain, pancreatic pseuodocyts  HPI: Monique Holland is a 54 y.o. female with a past medical history significant for anxiety, depression, schizophrenia,  asthma, attention,  diabetes mellitus type 2, chronic pancreatitis with a pancreatic pseudocyst.  In review of her records, she had a prolonged hospitalization in [place] at [facility], 06/18/2019 through 07/10/2019. She was admitted with severe acute necrotizing pancreatitis, and nonocclusive splenic vein thrombosis and also found to have hypercalcemia felt secondary to hyperparathyroidism. She underwent left lower lobe parathyroidectomy on 07/04/2019 while hospitalized. She was discharged with nocturnal tube feeds via nasoenteric tube.   She presented to [place] ED on 07/17/2019 with nausea and hematemesis.  She reported having poor p.o. intake since being discharged from the hospital with vomiting on a daily basis.  Hemoglobin level was 9.2.  Sodium 129.  LFTs were normal.  Abdominal/pelvic CT showed fluid collections involving the pancreatic head body and tail the largest is multiloculated and extending from the head to the pancreatic body measuring 7.9 x 12.2 x 10.4 cm There is an additional fluid collection measuring 5.2 x 6.7 x 6.3 cm along the pancreatic tail, extensive peripancreatic inflammation.  Slight narrowing of the splenic vein without complete occlusion or thrombus formation.  Several edematous loops of small bowel and thickening at the splenic flexure of the colon was noted.  She was transferred to [facility] for admission. She   underwent an EGD by Dr. Bryan Lemma 07/18/2019 which identified candidiasis esophagitis, and gastritis.  The duodenum was normal. She was prescribed Diflucan for her candidiasis esophagitis, PPI continued and she was discharged  home on 07/21/2019. Possible endoscopic cystgastrostomy was standard for her pancreatic pseudocyst management.  She was readmitted to the hospital 08/02/2019 with complaints of nausea, vomiting and epigastric pain.  Laboratory studies were unremarkable.  An abdominal MRI/MRCP showed stable peripancreatic fluid collections which did not appear to be compressing on her stomach.  She was discharged home on 08/06/2019.   Her upper abdominal pain never completely resolved.  However, she denied having any further nausea or vomiting until yesterday.  She developed watery nonbloody dark brown diarrhea, possibly black in color early yesterday morning followed by epigastric pain then later vomited partially undigested food with a moderate amount of bright red blood x3 episodes.She presented to [facility] ED on 08/16/2019 further evaluation.  Currently, she is ambulating up in her room.  She denies having any nausea or vomiting.  She continues to have upper abdominal pain which is not severe at this time.  No heartburn or dysphagia.  She takes Pantoprazole 40 mg p.o. twice daily. No diarrhea since arriving to the ED.  She reported having a colonoscopy by Dr. Alan Ripper at [facility] in [facility] that was normal, she had hemorrhoids banded. I will request a copy of the colonoscopy report. NSAID use.  No alcohol use.   ED course 08/16/2019: Sodium 134.  Potassium 3.6.  Glucose 403.  BUN 11.  Creatinine 1.10.  Anion gap 12.  Alk phos 77.  AST 34.  ALT 25.  Albumin 3.7.  Lipase 5.  Total bili 0.7.  WBC 7.9.  Hemoglobin 10.5.  Hematocrit 36.  MCV 78.9.  Platelet 297.  Abdominal/pelvic CT with contrast 08/17/2019:  1. Unchanged pancreatic pseudocysts, largest collection in the left upper quadrant measures 12.3 x 6.7 cm, not significantly changed from  exam earlier this month. No air within either of these collections. Mild residual stranding which has slightly improved. No new fluid collection. 2. The  stomach and splenic flexure of the colon about these pancreatic collections with mild wall thickening, likely reactive. 3. Sequela of chronic pancreatitis with pancreatic head calcifications. 4. Bilateral nephrolithiasis and nephrocalcinosis. 5. Left lower lobe atelectasis with trace left pleural effusion. 6. Additional stable chronic findings as described. Aortic Atherosclerosis   Abdominal MRI/MRCP 08/03/2019: Sequela of prior/chronic pancreatitis, including a dominant 12.6 cm fluid collection/pseudocyst along the pancreatic body/tail, unchanged from recent CT. Mild peripancreatic inflammatory changes/fluid. No cholelithiasis or choledocholithiasis is evident on MR.   Past Medical History:  Diagnosis Date  . Anxiety   . Asthma   . Depression   . Diabetes mellitus without complication ([place])   . Frequency of urination   . GERD (gastroesophageal reflux disease)   . History of asthma    last episode yrs ago  . Hypertension   . OSA (obstructive sleep apnea)    pt had study done oct 2014--  schedule for cpap titrate after kidney stone surgery  . Pancreatitis   . Schizophrenia ([place])   . Ureteral calculi    BILATERAL  . Wears glasses     Past Surgical History:  Procedure Laterality Date  . BIOPSY  07/18/2019   Procedure: BIOPSY;  Surgeon: Lavena Bullion, DO;  Location: [facility];  Service: Gastroenterology;;  . [place]  &  2002   w/ bilateral tubal ligation in 2002  . CYSTOSCOPY W/ URETERAL STENT PLACEMENT Right 08/10/2018   Procedure: CYSTOSCOPY WITH RETROGRADE PYELOGRAM/URETERAL STENT PLACEMENT;  Surgeon: Festus Aloe, MD;  Location: [facility];  Service: Urology;  Laterality: Right;  . CYSTOSCOPY WITH RETROGRADE PYELOGRAM, URETEROSCOPY AND STENT PLACEMENT Bilateral 05/02/2013   Procedure: CYSTOSCOPY WITH RETROGRADE PYELOGRAM, URETEROSCOPY ;  Surgeon: Bernestine Amass, MD;  Location: [facility];  Service: Urology;  Laterality: Bilateral;   . CYSTOSCOPY WITH RETROGRADE PYELOGRAM, URETEROSCOPY AND STENT PLACEMENT Right 08/15/2018   Procedure: CYSTOSCOPY WITH RIGHT RETROGRADE PYELOGRAM, RIGHT URETEROSCOPY WITH HOLMIUM LASER AND STENT PLACEMENT;  Surgeon: Lucas Mallow, MD;  Location: [facility];  Service: Urology;  Laterality: Right;  . CYSTOSCOPY WITH STENT PLACEMENT Left 05/02/2013   Procedure: CYSTOSCOPY WITH STENT PLACEMENT;  Surgeon: Bernestine Amass, MD;  Location: [facility];  Service: Urology;  Laterality: Left;  . ESOPHAGOGASTRODUODENOSCOPY (EGD) WITH PROPOFOL N/A 07/18/2019   Procedure: ESOPHAGOGASTRODUODENOSCOPY (EGD) WITH PROPOFOL;  Surgeon: Lavena Bullion, DO;  Location: [facility];  Service: Gastroenterology;  Laterality: N/A;  . HOLMIUM LASER APPLICATION Bilateral [id number]/6314   Procedure: HOLMIUM LASER APPLICATION;  Surgeon: Bernestine Amass, MD;  [facility];  Service: Urology;  Laterality: Bilateral;  . LAPAROSCOPIC APPENDECTOMY N/A 05/22/2014   Procedure: APPENDECTOMY LAPAROSCOPIC;  Surgeon: Excell Seltzer, MD;  Location: [facility];  Service: General;  Laterality: N/A;  . TONSILLECTOMY  age 54    Prior to Admission medications   Medication Sig Start Date End Date Taking? Authorizing Provider  albuterol (VENTOLIN HFA) 108 (90 Base) MCG/ACT inhaler Inhale 1-2 puffs into the lungs every 6 (six) hours as needed for wheezing or shortness of breath. 07/21/19  Yes Regalado, Belkys A, MD  amitriptyline (ELAVIL) 75 MG tablet Take 75 mg by mouth at bedtime. 08/10/19  Yes [provider]  amLODipine (NORVASC) 10 MG tablet Take 10 mg by mouth daily. 08/09/19  Yes [provider]  clonazePAM (  KLONOPIN) 0.5 MG tablet Take 0.5 mg by mouth daily as needed for anxiety. 05/23/19  Yes [provider]  lamoTRIgine (LAMICTAL) 200 MG tablet Take 200 mg by mouth daily. 06/06/19  Yes [provider]  LANTUS SOLOSTAR 100 UNIT/ML Solostar Pen Inject 15 Units into the  skin at bedtime. Patient taking differently: Inject 30 Units into the skin at bedtime.  07/21/19  Yes Regalado, Belkys A, MD  LATUDA 60 MG TABS Take 1 tablet by mouth at bedtime. 08/10/19  Yes [provider]  lisinopril-hydrochlorothiazide (ZESTORETIC) 20-12.5 MG tablet Take 1 tablet by mouth 2 (two) times daily. 07/31/19  Yes [provider]  metoprolol tartrate (LOPRESSOR) 25 MG tablet Take 0.5 tablets (12.5 mg total) by mouth 2 (two) times daily. 08/05/19  Yes Georgette Shell, MD  ondansetron (ZOFRAN) 8 MG tablet Take 8 mg by mouth every 8 (eight) hours as needed for nausea or vomiting.  07/26/19  Yes [provider]  Oxycodone HCl 10 MG TABS Take 10 mg by mouth 4 (four) times daily as needed (pain).  08/12/19  Yes [provider]  pantoprazole (PROTONIX) 40 MG tablet Take 1 tablet (40 mg total) by mouth 2 (two) times daily. 08/05/19 09/04/19 Yes Georgette Shell, MD  PREMARIN vaginal cream Place 1 Applicatorful vaginally at bedtime.  08/10/19  Yes [provider]  senna (SENOKOT) 8.6 MG TABS tablet Take 1 tablet (8.6 mg total) by mouth 2 (two) times daily. 07/21/19  Yes Regalado, Belkys A, MD  SYMBICORT 160-4.5 MCG/ACT inhaler Inhale 2 puffs into the lungs 2 (two) times daily as needed (SOB/wheezing).  04/26/19  Yes [provider]  traZODone (DESYREL) 100 MG tablet Take 100 mg by mouth at bedtime.   Yes [provider]  TRULICITY 4.5 GB/1.5VV SOPN Inject 4.5 mg into the skin once a week. Take on Wed 07/29/19   [provider]    Current Facility-Administered Medications  Medication Dose Route Frequency Provider Last Rate Last Admin  . 0.9 %  sodium chloride infusion   Intravenous Continuous Regalado, Belkys A, MD 100 mL/hr at 08/17/19 1017 Rate Change at 08/17/19 1017  . acetaminophen (TYLENOL) tablet 650 mg  650 mg Oral Q6H PRN Clarnce Flock, MD   650 mg at 08/17/19 6160   Or  . acetaminophen (TYLENOL) suppository 650  mg  650 mg Rectal Q6H PRN Clarnce Flock, MD      . albuterol (PROVENTIL) (2.5 MG/3ML) 0.083% nebulizer solution 2.5 mg  2.5 mg Inhalation Q6H PRN Clarnce Flock, MD      . amitriptyline (ELAVIL) tablet 75 mg  75 mg Oral QHS Clarnce Flock, MD      . clonazePAM Bobbye [place]) tablet 0.5 mg  0.5 mg Oral Daily PRN Clarnce Flock, MD      . HYDROmorphone (DILAUDID) injection 0.5-1 mg  0.5-1 mg Intravenous Q2H PRN Clarnce Flock, MD   1 mg at 08/17/19 0530  . insulin aspart (novoLOG) injection 0-15 Units  0-15 Units Subcutaneous TID WC Clarnce Flock, MD   3 Units at 08/17/19 1245  . insulin aspart (novoLOG) injection 0-5 Units  0-5 Units Subcutaneous QHS Clarnce Flock, MD      . insulin glargine (LANTUS) injection 5 Units  5 Units Subcutaneous Daily Clarnce Flock, MD   5 Units at 08/17/19 [phone]  . lamoTRIgine (LAMICTAL) tablet 200 mg  200 mg Oral Daily Clarnce Flock, MD   200 mg at 08/17/19 0626  .  lurasidone (LATUDA) tablet 60 mg  60 mg Oral QHS Clarnce Flock, MD      . metoprolol tartrate (LOPRESSOR) tablet 12.5 mg  12.5 mg Oral BID Clarnce Flock, MD   12.5 mg at 08/17/19 7124  . ondansetron (ZOFRAN) tablet 4 mg  4 mg Oral Q6H PRN Clarnce Flock, MD       Or  . ondansetron [facility]) injection 4 mg  4 mg Intravenous Q6H PRN Clarnce Flock, MD   4 mg at 08/17/19 0831  . pantoprazole (PROTONIX) injection 40 mg  40 mg Intravenous Q12H Regalado, Belkys A, MD      . traZODone (DESYREL) tablet 100 mg  100 mg Oral QHS Clarnce Flock, MD        Allergies as of 08/16/2019  . (No Known Allergies)    Family History  Family history unknown: Yes    Social History   Socioeconomic History  . Marital status: Married    Spouse name: Not on file  . Number of children: Not on file  . Years of education: Not on file  . Highest education level: Not on file  Occupational History  . Not on file  Tobacco Use  . Smoking status: Never Smoker  .  Smokeless tobacco: Never Used  Substance and Sexual Activity  . Alcohol use: No  . Drug use: No  . Sexual activity: Not on file  Other Topics Concern  . Not on file  Social History Narrative  . Not on file   Social Determinants of Health   Financial Resource Strain:   . Difficulty of Paying Living Expenses:   Food Insecurity:   . Worried About Charity fundraiser in the Last Year:   . Arboriculturist in the Last Year:   Transportation Needs:   . Film/video editor (Medical):   Marland Kitchen Lack of Transportation (Non-Medical):   Physical Activity:   . Days of Exercise per Week:   . Minutes of Exercise per Session:   Stress:   . Feeling of Stress :   Social Connections:   . Frequency of Communication with Friends and Family:   . Frequency of Social Gatherings with Friends and Family:   . Attends Religious Services:   . Active Member of Clubs or Organizations:   . Attends Archivist Meetings:   Marland Kitchen Marital Status:   Intimate Partner Violence:   . Fear of Current or Ex-Partner:   . Emotionally Abused:   Marland Kitchen Physically Abused:   . Sexually Abused:     Review of Systems: See HPI, all other systems reviewed and are negative  Physical Exam: Vital signs in last 24 hours: Temp:  [97.7 F (36.5 C)-99.1 F (37.3 C)] 97.7 F (36.5 C) (03/27 0529) Pulse Rate:  [94-104] 98 (03/27 0923) Resp:  [10-29] 18 (03/27 0529) BP: (128-154)/(85-109) 147/109 (03/27 0921) SpO2:  [87 %-100 %] 98 % (03/27 0529) Weight:  [138.4 kg] 138.4 kg (03/27 0529) Last BM Date: 08/16/19 General:  Alert,  well-developed, well-nourished, pleasant and cooperative in NAD. Head:  Normocephalic and atraumatic. Eyes:  No scleral icterus. Conjunctiva pink. Ears:  Normal auditory acuity. Nose:  No deformity, discharge or lesions. Mouth: Dentition intact. No ulcers or lesions.  Neck:  Supple. No lymphadenopathy or thyromegaly.  Lungs: Breath sounds clear throughout. Heart: Regular rate and rhythm, no  murmurs. Abdomen: Soft, nondistended.  Mild epigastric and left upper quadrant tenderness without rebound or guarding.  Positive bowel sounds  all 4 quadrants.  No obvious HSM in the setting of an obese abdomen. Rectal:  Deferred. Msk:  Symmetrical without gross deformities.  Pulses:  Normal pulses noted. Extremities:  Without clubbing or edema. Neurologic:  Alert and  oriented x4. No focal deficits.  Skin:  Intact without significant lesions or rashes. Psych:  Alert and cooperative. Normal mood and affect.  Intake/Output from previous day: 03/26 0701 - 03/27 0700 In: 260 [I.V.:260] Out: -  Intake/Output this shift: No intake/output data recorded.  Lab Results: Recent Labs    08/16/19 1920 08/17/19 0522 08/17/19 1229  WBC 7.9 5.4 5.9  HGB 10.5* 9.8* 10.6*  HCT 36.0 34.6* 37.5  PLT 297 223 273   BMET Recent Labs    08/16/19 1920 08/17/19 0522  NA 134* 137  K 3.6 3.3*  CL 98 104  CO2 24 24  GLUCOSE 403* 250*  BUN 11 12  CREATININE 1.10* 0.92  CALCIUM 9.3 8.7*   LFT Recent Labs    08/17/19 0522  PROT 6.4*  ALBUMIN 3.4*  AST 28  ALT 23  ALKPHOS 71  BILITOT 0.6   PT/INR No results for input(s): LABPROT, INR in the last 72 hours. Hepatitis Panel No results for input(s): HEPBSAG, HCVAB, HEPAIGM, HEPBIGM in the last 72 hours.    Studies/Results: CT ABDOMEN PELVIS W CONTRAST  Result Date: 08/17/2019 CLINICAL DATA:  Recurrent left upper quadrant abdominal pain. Nausea and vomiting. Recent diagnosis of necrotizing pancreatitis. EXAM: CT ABDOMEN AND PELVIS WITH CONTRAST TECHNIQUE: Multidetector CT imaging of the abdomen and pelvis was performed using the standard protocol following bolus administration of intravenous contrast. CONTRAST:  119m OMNIPAQUE IOHEXOL 300 MG/ML  SOLN COMPARISON:  Most recent CT 07/29/2019. FINDINGS: Lower chest: Left lower lobe atelectasis with trace left pleural effusion. Hepatobiliary: No focal hepatic lesion. There are slightly prominent  size. Gallbladder partially distended. No calcified gallstone. Slight common bile duct prominence a 7 mm. Pancreas: Peripancreatic fluid collection extending along the body and tail, dominant collection measuring 12.3 x 6.7 cm, not significantly changed from prior exam. Adjacent collection measures 5.9 x 5.2 cm, also not significantly changed. A 2.5 cm collection in the left upper quadrant may be contiguous with the smaller pseudocysts. No air within either of these collections. Mild generalized fat stranding which is grossly stable. No new peripancreatic fluid collection. Calcifications at the pancreatic head again seen. Pancreatic duct not well defined. Spleen: Upper normal in size spanning 13 cm. No splenic infarct or focal abnormality. Adrenals/Urinary Tract: No adrenal nodule. No hydronephrosis. Similar bilateral perinephric edema. Multiple calcifications within bilateral renal pyramids likely combination of nonobstructing stones and nephrocalcinosis. No ureteral calculi. Urinary bladder is partially distended. Stomach/Bowel: Portions of the stomach about the pancreatic pseudocyst. No abnormal gastric distension. No small bowel inflammation or obstruction. Prior appendectomy. The splenic flexure abuts the pancreatic pseudocyst in the left upper quadrant with mild adjacent wall thickening, likely reactive. Small volume of colonic stool. Vascular/Lymphatic: Aortic atherosclerosis. No aortic aneurysm. The portal vein is patent. Splenic vein is attenuated not definitely included. There a few prominent left upper quadrant collaterals. No adenopathy. Reproductive: Uterus and bilateral adnexa are unremarkable. Other: Peripancreatic fluid collections as described. No new intra-abdominal fluid collection. No ascites. Scattered subcutaneous densities in the anterior abdominal wall suggesting medication injection sites. There is no free air. Musculoskeletal: Degenerative change in the lumbar spine. 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 IMPRESSION: 1. Unchanged pancreatic pseudocysts, largest collection in the left upper quadrant  measures 12.3 x 6.7 cm, not significantly changed from exam earlier this month. No air within either of these collections. Mild residual stranding which has slightly improved. No new fluid collection. 2. The stomach and splenic flexure of the colon about these pancreatic collections with mild wall thickening, likely reactive. 3. Sequela of chronic pancreatitis with pancreatic head calcifications. 4. Bilateral nephrolithiasis and nephrocalcinosis. 5. Left lower lobe atelectasis with trace left pleural effusion. 6. Additional stable chronic findings as described. Aortic Atherosclerosis (ICD10-I70.0). Electronically Signed   By: Keith Rake M.D.   On: 08/17/2019 00:21    IMPRESSION/PLAN:  28.  54 year old female with a history of pancreatitis with pancreatic pseudocyst presents to the ED 3/26 with nausea, hematemesis and upper abdominal pain an diarrhea ? melena.  CT abdomen pelvis 3/27 showed stable pancreatic pseudocyst, sequela of chronic pancreatitis with pancreatic head calcifications.  The stomach and splenic flexure of the colon with mild wall thickening.  -EGD with Dr.  Henrene Pastor  08/18/2019.  EGD benefits and risk discussed including risk of sedation, risk of bleeding, perforation and infection -Clear liquid diet then n.p.o. after midnight -Possible cyst gastrostomy in the near future as pancreatic pseudocyst are stable without significant improvement -Continue normal saline at 100 cc an hour -Pain management per the hospitalist -Ondansetron 4 mg p.o. IV every 6 hours as needed -Check GI panel if diarrhea recurs  -PPI 26m IV bid  -[facility]  Chronic microcytic anemia.  Hemoglobin 10.6 up from 9.8.  MCV 78.5. -Repeat CBC, iron panel in a.m.  3. DM on insulin.   Hyperglycemia.  Admission glucose 403.  Further recommendations per Dr. PVerta EllenMDorathy Daft 08/17/2019, 1:14 PM  GI ATTENDING  History,labs,x-rays,previous hospital stay and endoscopy report reviewed. Agree with above consultation note as outlined. Plans for EGD tomorrow to evaluate hematemesis from yesterday. Looks well this afternoon save epigastric pain with palpation. The patient is high risk given her body habitus and diabetes.The nature of the procedure, as well as the risks, benefits, and alternatives were carefully and thoroughly reviewed with the patient. Ample time for discussion and questions allowed. The patient understood, was satisfied, and agreed to proceed.  JDocia Chuck PGeri Seminole, M.D. [facility]Harry [facility]

## 2019-08-17 NOTE — Progress Notes (Signed)
Pt came from ED this morning. Lab called for pt's blood is ready. Pt's Hgb-10.5, messaged  To Dr Corinna Lines just to conformed , no response. Will continue to monitor.

## 2019-08-18 ENCOUNTER — Encounter (HOSPITAL_COMMUNITY)
Admission: EM | Disposition: A | Discharge status: Home / Self Care | Payer: Self-pay | Source: Home / Self Care | Attending: Internal Medicine

## 2019-08-18 ENCOUNTER — Inpatient Hospital Stay (HOSPITAL_COMMUNITY): Payer: 59 | Admitting: Certified Registered Nurse Anesthetist

## 2019-08-18 ENCOUNTER — Encounter (HOSPITAL_COMMUNITY): Payer: Self-pay | Admitting: Family Medicine

## 2019-08-18 DIAGNOSIS — K920 Hematemesis: Secondary | ICD-10-CM

## 2019-08-18 DIAGNOSIS — R1110 Vomiting, unspecified: Secondary | ICD-10-CM

## 2019-08-18 HISTORY — PX: ESOPHAGOGASTRODUODENOSCOPY (EGD) WITH PROPOFOL: SHX5813

## 2019-08-18 LAB — CBC
HCT: 33.9 % — ABNORMAL LOW (ref 36.0–46.0)
HCT: 36.4 % (ref 36.0–46.0)
Hemoglobin: 9.9 g/dL — ABNORMAL LOW (ref 12.0–15.0)
Hemoglobin: 10.4 g/dL — ABNORMAL LOW (ref 12.0–15.0)
MCH: 22.5 pg — ABNORMAL LOW (ref 26.0–34.0)
MCH: 22.2 pg — ABNORMAL LOW (ref 26.0–34.0)
MCHC: 29.2 g/dL — ABNORMAL LOW (ref 30.0–36.0)
MCHC: 28.6 g/dL — ABNORMAL LOW (ref 30.0–36.0)
MCV: 77 fL — ABNORMAL LOW (ref 80.0–100.0)
MCV: 77.6 fL — ABNORMAL LOW (ref 80.0–100.0)
Platelets: 183 10*3/uL (ref 150–400)
Platelets: 249 10*3/uL (ref 150–400)
RBC: 4.4 MIL/uL (ref 3.87–5.11)
RBC: 4.69 MIL/uL (ref 3.87–5.11)
RDW: 15 % (ref 11.5–15.5)
RDW: 15.1 % (ref 11.5–15.5)
WBC: 5.5 10*3/uL (ref 4.0–10.5)
WBC: 5.7 10*3/uL (ref 4.0–10.5)
nRBC: 0 % (ref 0.0–0.2)
nRBC: 0 % (ref 0.0–0.2)

## 2019-08-18 LAB — GLUCOSE, CAPILLARY
Glucose-Capillary: 219 mg/dL — ABNORMAL HIGH (ref 70–99)
Glucose-Capillary: 186 mg/dL — ABNORMAL HIGH (ref 70–99)
Glucose-Capillary: 308 mg/dL — ABNORMAL HIGH (ref 70–99)
Glucose-Capillary: 224 mg/dL — ABNORMAL HIGH (ref 70–99)

## 2019-08-18 LAB — BASIC METABOLIC PANEL
Anion gap: 11 (ref 5–15)
BUN: 6 mg/dL (ref 6–20)
CO2: 21 mmol/L — ABNORMAL LOW (ref 22–32)
Calcium: 8.4 mg/dL — ABNORMAL LOW (ref 8.9–10.3)
Chloride: 102 mmol/L (ref 98–111)
Creatinine, Ser: 0.77 mg/dL (ref 0.44–1.00)
GFR calc Af Amer: >60 mL/min (ref >60)
GFR calc non Af Amer: >60 mL/min (ref >60)
Glucose, Bld: 227 mg/dL — ABNORMAL HIGH (ref 70–99)
Potassium: 3.7 mmol/L (ref 3.5–5.1)
Sodium: 134 mmol/L — ABNORMAL LOW (ref 135–145)

## 2019-08-18 SURGERY — ESOPHAGOGASTRODUODENOSCOPY (EGD) WITH PROPOFOL
Anesthesia: Monitor Anesthesia Care

## 2019-08-18 MED ORDER — HYDRALAZINE HCL 20 MG/ML IJ SOLN
20.0000 mg | Freq: Once | INTRAMUSCULAR | Status: AC
  Administered 2019-08-18: 20 mg via INTRAVENOUS

## 2019-08-18 MED ORDER — PROPOFOL 500 MG/50ML IV EMUL
INTRAVENOUS | Status: AC
  Filled 2019-08-18: qty 50

## 2019-08-18 MED ORDER — PROPOFOL 500 MG/50ML IV EMUL
INTRAVENOUS | Status: DC | PRN
  Administered 2019-08-18: 150 ug/kg/min via INTRAVENOUS

## 2019-08-18 MED ORDER — HYDRALAZINE HCL 20 MG/ML IJ SOLN
INTRAMUSCULAR | Status: AC
  Filled 2019-08-18: qty 1

## 2019-08-18 MED ORDER — PROPOFOL 10 MG/ML IV BOLUS
INTRAVENOUS | Status: DC | PRN
  Administered 2019-08-18 (×2): 30 mg via INTRAVENOUS

## 2019-08-18 MED ORDER — AMLODIPINE BESYLATE 10 MG PO TABS
10.0000 mg | ORAL_TABLET | Freq: Every day | ORAL | Status: DC
  Administered 2019-08-18 – 2019-08-27 (×10): 10 mg via ORAL
  Filled 2019-08-18 (×10): qty 1

## 2019-08-18 MED ORDER — LACTATED RINGERS IV SOLN
INTRAVENOUS | Status: AC | PRN
  Administered 2019-08-18: 1000 mL via INTRAVENOUS

## 2019-08-18 MED ORDER — SODIUM CHLORIDE 0.9 % IV BOLUS
500.0000 mL | Freq: Once | INTRAVENOUS | Status: AC
  Administered 2019-08-18: 500 mL via INTRAVENOUS

## 2019-08-18 MED ORDER — INSULIN GLARGINE 100 UNIT/ML ~~LOC~~ SOLN
10.0000 [IU] | Freq: Every day | SUBCUTANEOUS | Status: DC
  Administered 2019-08-19 – 2019-08-22 (×4): 10 [IU] via SUBCUTANEOUS
  Filled 2019-08-18 (×5): qty 0.1

## 2019-08-18 SURGICAL SUPPLY — 15 items

## 2019-08-18 NOTE — Transfer of Care (Signed)
Immediate Anesthesia Transfer of Care Note  Patient: Monique Holland  Procedure(s) Performed: ESOPHAGOGASTRODUODENOSCOPY (EGD) WITH PROPOFOL (N/A )  Patient Location: PACU  Anesthesia Type:MAC  Level of Consciousness: awake, alert , oriented and patient cooperative  Airway & Oxygen Therapy: Patient Spontanous Breathing and Patient connected to face mask  Post-op Assessment: Report given to RN and Post -op Vital signs reviewed and stable  Post vital signs: Reviewed and stable  Last Vitals:  Vitals Value Taken Time  BP    Temp    Pulse 103 08/18/19 1131  Resp 17 08/18/19 1131  SpO2 100 % 08/18/19 1131  Vitals shown include unvalidated device data.  Last Pain:  Vitals:   08/18/19 1059  TempSrc: Oral  PainSc: 0-No pain      Patients Stated Pain Goal: 2 (56/38/93 7342)  Complications: No apparent anesthesia complications

## 2019-08-18 NOTE — Op Note (Signed)
[facility] Patient Name: Monique Holland Procedure Date: 08/18/2019 MRN: [id number] Attending MD: Wilhemina Bonito. Marina Goodell , MD Date of Birth: Dec 09, 1965 CSN: [id number] Age: 54 Admit Type: Inpatient Procedure:                Upper GI endoscopy Indications:              Coffee-ground emesis (minor and self-limited). No                            change in hemoglobin. History of severe                            pancreatitis with evolving pseudocyst. Has had no                            further nausea or vomiting since admission. Pain is                            better. She is hungry Providers:                Wilhemina Bonito. Marina Goodell, MD, Zoe Lan, RN, Koren Bound,                            Technician Referring MD:             [facility] Medicines:                Monitored Anesthesia Care Complications:            No immediate complications. Estimated Blood Loss:     Estimated blood loss: none. Procedure:                Pre-Anesthesia Assessment:                           - Prior to the procedure, a History and Physical                            was performed, and patient medications and                            allergies were reviewed. The patient's tolerance of                            previous anesthesia was also reviewed. The risks                            and benefits of the procedure and the sedation                            options and risks were discussed with the patient.                            All questions were answered, and informed consent  was obtained. Prior Anticoagulants: The patient has                            taken no previous anticoagulant or antiplatelet                            agents. ASA Grade Assessment: III - A patient with                            severe systemic disease. After reviewing the risks                            and benefits, the patient was deemed in                            satisfactory  condition to undergo the procedure.                           After obtaining informed consent, the endoscope was                            passed under direct vision. Throughout the                            procedure, the patient's blood pressure, pulse, and                            oxygen saturations were monitored continuously. The                            [id number]-H190 ([id number]) Olympus gastroscope was                            introduced through the mouth, and advanced to the                            second part of duodenum. The upper GI endoscopy was                            accomplished without difficulty. The patient                            tolerated the procedure well. Scope In: Scope Out: Findings:      The esophagus was normal.      The stomach was normal. No blood or bleeding.      The examined duodenum revealed evidence of mild to moderate, but       definite, extrinsic compression in the region of the bulb. The exam was       otherwise normal.      The cardia and gastric fundus were normal on retroflexion. Impression:               1. Essentially normal EGD. Suggestion of mild to  moderate extrinsic compression in the region of the                            duodenal bulb from known pancreatic pseudocyst. No                            blood, bleeding, or mucosal lesions                           2. History of severe pancreatitis with evolving                            pseudocyst                           3. Patient is feeling better since her initial                            presentation. Moderate Sedation:      none Recommendation:           1. Low-fat diet                           2. If no significant problems overnight may be                            discharged in the morning                           3. She has follow-up with Dr. Bryan Lemma (GI) September 03, 2019. She is aware. She will keep that                             appointment                           I have reviewed these findings with the patient. I                            have provided her a copy of this report. We are                            available for questions or problems. We will sign                            off. Procedure Code(s):        --- Professional ---                           [phone], Esophagogastroduodenoscopy, flexible,                            transoral; diagnostic, including collection of  specimen(s) by brushing or washing, when performed                            (separate procedure) Diagnosis Code(s):        --- Professional ---                           K92.0, Hematemesis CPT copyright 2019 American Medical Association. All rights reserved. The codes documented in this report are preliminary and upon coder review may  be revised to meet current compliance requirements. Wilhemina Bonito. Marina Goodell, MD 08/18/2019 11:32:30 AM This report has been signed electronically. Number of Addenda: 0

## 2019-08-18 NOTE — Interval H&P Note (Signed)
History and Physical Interval Note:  08/18/2019 10:59 AM  Monique Holland  has presented today for surgery, with the diagnosis of hematemesis.  The various methods of treatment have been discussed with the patient and family. After consideration of risks, benefits and other options for treatment, the patient has consented to  Procedure(s): ESOPHAGOGASTRODUODENOSCOPY (EGD) WITH PROPOFOL (N/A) as a surgical intervention.  The patient's history has been reviewed, patient examined, no change in status, stable for surgery.  I have reviewed the patient's chart and labs.  Questions were answered to the patient's satisfaction.     [name]

## 2019-08-18 NOTE — Anesthesia Postprocedure Evaluation (Signed)
Anesthesia Post Note  Patient: Jazzlyn Huizenga  Procedure(s) Performed: ESOPHAGOGASTRODUODENOSCOPY (EGD) WITH PROPOFOL (N/A )     Patient location during evaluation: Endoscopy Anesthesia Type: MAC Level of consciousness: awake Pain management: pain level controlled Vital Signs Assessment: post-procedure vital signs reviewed and stable Respiratory status: spontaneous breathing Cardiovascular status: stable Postop Assessment: no apparent nausea or vomiting Anesthetic complications: no    Last Vitals:  Vitals:   08/18/19 1140 08/18/19 1150  BP: (!) 152/92 (!) 145/80  Pulse: (!) 101 (!) 102  Resp: 17 (!) 22  Temp:    SpO2: 100% 99%    Last Pain:  Vitals:   08/18/19 1150  TempSrc:   PainSc: 0-No pain   Pain Goal: Patients Stated Pain Goal: 7 (08/18/19 1134)                 Huston Foley

## 2019-08-18 NOTE — Progress Notes (Signed)
Initial Nutrition Assessment  RD working remotely.  DOCUMENTATION CODES:   Morbid obesity  INTERVENTION:   -RD will follow for diet advancement and add supplements as appropriate  NUTRITION DIAGNOSIS:   Increased nutrient needs related to chronic illness(chronic pancreatitis with pseudocyst) as evidenced by estimated needs.  GOAL:   Patient will meet greater than or equal to 90% of their needs  MONITOR:   Diet advancement, Labs, Weight trends, Skin, I & O's  REASON FOR ASSESSMENT:   Malnutrition Screening Tool    ASSESSMENT:   Monique Holland is a 54 y.o. female with hx of IDDM2, HTN, gastritis, chronic pancreatitis w pseudocyst, morbid obesity, who presents with abdominal pain.  Pt admitted with acute on chronic abdominal pain, chronic pancreatitis, and pancreatic pseudocyst.   Reviewed I/O's: +3.1 L x 24 hours and +3.4 L since admission  Attempted to speak with pt x 2 via phone, however, unable to reach. Pt now in endoscopy suite. Per GI notes, plan for EGD today. Pt currently NPO.   Reviewed wt hx; pt has experienced a 4.5% wt loss over the past month, which is not significant for time frame.  Medications reviewed and include 0.9% sodium chloride infusion @ 100 ml/hr.  Lab Results  Component Value Date   HGBA1C 7.2 (H) 07/17/2019   PTA DM medications are 30 units insulin lantus solostar daily.   Labs reviewed: Na: 134, CBGS: 188-219 (inpatient orders for glycemic control are 0-15 units insulin aspart TID with meals, 0-5 units insulin aspart q HS, and 5 units insulin glargine daily).   Diet Order:   Diet Order            Diet NPO time specified  Diet effective midnight              EDUCATION NEEDS:   No education needs have been identified at this time  Skin:  Skin Assessment: Reviewed RN Assessment  Last BM:  08/16/19  Height:   Ht Readings from Last 1 Encounters:  08/18/19 5\' 9"  (1.753 m)    Weight:   Wt Readings from Last 1 Encounters:   08/18/19 135.6 kg    Ideal Body Weight:  65.9 kg  BMI:  Body mass index is 44.15 kg/m.  Estimated Nutritional Needs:   Kcal:  2100-2300  Protein:  115-130 grams  Fluid:  > 2.1 L    08/20/19, RD, LDN, CDCES Registered Dietitian II Certified Diabetes Care and Education Specialist Please refer to [facility] for RD and/or RD on-call/weekend/after hours pager

## 2019-08-18 NOTE — Anesthesia Preprocedure Evaluation (Addendum)
Anesthesia Evaluation  Patient identified by MRN, date of birth, ID band Patient awake    Reviewed: Allergy & Precautions, NPO status , Patient's Chart, lab work & pertinent test results, reviewed documented beta blocker date and time   Airway Mallampati: I  TM Distance: >3 FB Neck ROM: Full    Dental no notable dental hx. (+) Teeth Intact   Pulmonary asthma , sleep apnea ,    Pulmonary exam normal        Cardiovascular hypertension, Pt. on medications and Pt. on home beta blockers Normal cardiovascular exam     Neuro/Psych PSYCHIATRIC DISORDERS Anxiety Depression Schizophrenia negative neurological ROS     GI/Hepatic GERD  Medicated and Controlled,  Endo/Other  diabetes, Type 2, Insulin Dependent, Oral Hypoglycemic AgentsMorbid obesity  Renal/GU   negative genitourinary   Musculoskeletal   Abdominal (+) + obese,   Peds  Hematology  (+) Blood dyscrasia, anemia ,   Anesthesia Other Findings   Reproductive/Obstetrics                             Anesthesia Physical  Anesthesia Plan  ASA: III  Anesthesia Plan: MAC   Post-op Pain Management:    Induction:   PONV Risk Score and Plan: 2 and Treatment may vary due to age or medical condition  Airway Management Planned: Nasal Cannula, Natural Airway and Mask  Additional Equipment: None  Intra-op Plan:   Post-operative Plan:   Informed Consent: I have reviewed the patients History and Physical, chart, labs and discussed the procedure including the risks, benefits and alternatives for the proposed anesthesia with the patient or authorized representative who has indicated his/her understanding and acceptance.       Plan Discussed with: CRNA  Anesthesia Plan Comments:         Anesthesia Quick Evaluation

## 2019-08-18 NOTE — Progress Notes (Signed)
BP 167/126 prior to scheduled Metoprolol, after due medication given BP 160/111, MD paged with orders for Hydralazine 20 mg IV.  Medication given as ordered, off the floor to Endo for scheduled EGD

## 2019-08-18 NOTE — Progress Notes (Signed)
PROGRESS NOTE    Monique Holland  OIN:[id number] DOB: March 08, 1966 DOA: 08/16/2019 PCP: Knox Royalty, MD   Brief Narrative: 54 year old with past medical history significant for ID DM2, hypertension, gastritis, chronic pancreatitis with pseudocyst, morbid obesity who presents complaining of abdominal pain and vomiting a small amount of blood.  -Patient last hospitalization from 08/02/2019 130 07/1618 21 for epigastric abdominal pain nausea vomiting thought to be secondary to gastritis esophagitis.  Renal admission she had an MRCP which showed 12.6 cm pseudocyst, unchanged from recent CT scan. 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     Assessment & Plan:   Active Problems:   AKI (acute kidney injury) (HCC)   Type 2 diabetes mellitus (HCC)   Schizophrenia (HCC)   Obesity, Class III, BMI 40-49.9 (morbid obesity) (HCC)   Acute pancreatitis   Pancreatic pseudocyst   Abdominal pain   Essential hypertension   Non-intractable vomiting   Hematemesis with nausea  1-Acute on chronic abdominal pain, chronic pancreatitis, pancreatic pseudocyst: Continue with IV fluids. Continue with IV pain meds and Zofran as needed for nausea. GI has been consulted.  Follow GI recommendation in regards pancreatic pseudocyst.  Plan to resume low fat diet.   2-Hematemesis, questionable melena  Change Protonix to IV twice daily. Check hemoglobin every 8 hours. No need for blood transfusion at this time. GI has been consulted History of Candida esophagitis: She should have  completed treatment. Plan for endoscopy today which was normal.   3-Diarrhea: We will consider start pancreatic enzymes.  Hypokalemia: Resolved AKI; hypovolemia.  Resolved.  Diabetes type 2  insulin-dependent CBG elevated at 300 range. Continue with low-dose Lantus. Continue with sliding scale insulin.  Asthma: Continue with albuterol Hypertension; resume norvasc.  Anxiety: Continue with clonazepam as needed Depression: Continue with amitriptyline and trazodone Schizophrenia BPD: Continue with Lamictal and Latuda    Estimated body mass index is 44.15 kg/m as calculated from the following:   Height as of this encounter: 5\' 9"  (1.753 m).   Weight as of this encounter: 135.6 kg.   DVT prophylaxis: SCD Code Status: Full code Family Communication: Care discussed directly with patient Disposition Plan:  Patient is from: Home Anticipated d/c date: Home in 2 to 3 days after resolution of GI bleed and when patient is able to tolerate diet Barriers to d/c or necessity for inpatient status: monitor on diet, if tolerates diet might be able to be discharge  Consultants:   GI  Procedures:   None  Antimicrobials:  None  Subjective: Feeling better, no further vomiting since yesterday.   Objective: Vitals:   08/18/19 1059 08/18/19 1134 08/18/19 1140 08/18/19 1150  BP: (!) 156/100 136/71 (!) 152/92 (!) 145/80  Pulse:   (!) 101 (!) 102  Resp: 16 20 17  (!) 22  Temp: 98.7 F (37.1 C) 98.9 F (37.2 C)    TempSrc: Oral Oral    SpO2: 99% 100% 100% 99%  Weight: 135.6 kg     Height: 5\' 9"  (1.753 m)       Intake/Output Summary (Last 24 hours) at 08/18/2019 1236 Last data filed at 08/18/2019 1124 Gross per 24 hour  Intake 2432 ml  Output -  Net 2432 ml   Filed Weights   08/17/19 0529 08/18/19 1059  Weight: (!) 138.4 kg 135.6 kg    Examination:  General  exam: NAD Respiratory system: CTA Cardiovascular system: S 1, S 2 RRR Gastrointestinal system: BS present,soft, nt Central nervous system: non focal.  Extremities: symmetric power Skin: No rashes, lesions or ulcers   Data Reviewed: I have personally reviewed following labs and imaging  studies  CBC: Recent Labs  Lab 08/16/19 1920 08/17/19 0522 08/17/19 1229 08/17/19 2024 08/18/19 0453  WBC 7.9 5.4 5.9 6.7 5.5  NEUTROABS 4.1  --   --   --   --   HGB 10.5* 9.8* 10.6* 9.6* 9.9*  HCT 36.0 34.6* 37.5 33.3* 33.9*  MCV 78.9* 78.8* 78.5* 78.5* 77.0*  PLT 297 223 273 207 161   Basic Metabolic Panel: Recent Labs  Lab 08/16/19 1920 08/17/19 0522 08/18/19 0453  NA 134* 137 134*  K 3.6 3.3* 3.7  CL 98 104 102  CO2 24 24 21*  GLUCOSE 403* 250* 227*  BUN 11 12 6   CREATININE 1.10* 0.92 0.77  CALCIUM 9.3 8.7* 8.4*   GFR: Estimated Creatinine Clearance: 122.1 mL/min (by C-G formula based on SCr of 0.77 mg/dL). Liver Function Tests: Recent Labs  Lab 08/16/19 1920 08/17/19 0522  AST 34 28  ALT 25 23  ALKPHOS 77 71  BILITOT 0.7 0.6  PROT 7.0 6.4*  ALBUMIN 3.7 3.4*   Recent Labs  Lab 08/16/19 1920  LIPASE 35   No results for input(s): AMMONIA in the last 168 hours. Coagulation Profile: No results for input(s): INR, PROTIME in the last 168 hours. Cardiac Enzymes: No results for input(s): CKTOTAL, CKMB, CKMBINDEX, TROPONINI in the last 168 hours. BNP (last 3 results) No results for input(s): PROBNP in the last 8760 hours. HbA1C: No results for input(s): HGBA1C in the last 72 hours. CBG: Recent Labs  Lab 08/17/19 0722 08/17/19 1201 08/17/19 1618 08/17/19 2145 08/18/19 0721  GLUCAP 280* 177* 193* 188* 219*   Lipid Profile: No results for input(s): CHOL, HDL, LDLCALC, TRIG, CHOLHDL, LDLDIRECT in the last 72 hours. Thyroid Function Tests: No results for input(s): TSH, T4TOTAL, FREET4, T3FREE, THYROIDAB in the last 72 hours. Anemia Panel: No results for input(s): VITAMINB12, FOLATE, FERRITIN, TIBC, IRON, RETICCTPCT in the last 72 hours. Sepsis Labs: No results for input(s): PROCALCITON, LATICACIDVEN in the last 168 hours.  Recent Results (from the past 240 hour(s))  SARS CORONAVIRUS 2 (TAT 6-24 HRS) Nasopharyngeal Nasopharyngeal Swab     Status:  None   Collection Time: 08/17/19  2:10 AM   Specimen: Nasopharyngeal Swab  Result Value Ref Range Status   SARS Coronavirus 2 NEGATIVE NEGATIVE Final    Comment: (NOTE) SARS-CoV-2 target nucleic acids are NOT DETECTED. The SARS-CoV-2 RNA is generally detectable in upper and lower respiratory specimens during the acute phase of infection. Negative results do not preclude SARS-CoV-2 infection, do not rule out co-infections with other pathogens, and should not be used as the sole basis for treatment or other patient management decisions. Negative results must be combined with clinical observations, patient history, and epidemiological information. The expected result is Negative. Fact Sheet for Patients: SugarRoll.be Fact Sheet for Healthcare Providers: https://www.woods-mathews.com/ This test is not yet approved or cleared by the [place] FDA and  has been authorized for detection and/or diagnosis of SARS-CoV-2 by FDA under an Emergency Use Authorization (EUA). This EUA will remain  in effect (meaning this test can be used) for the duration of the COVID-19 declaration under Section 56 4(b)(1) of the Act, 21 U.S.C. section 360bbb-3(b)(1), unless the authorization is terminated or revoked sooner. Performed at [facility], [address], [place], [place] [address]          Radiology Studies: CT ABDOMEN PELVIS W CONTRAST  Result Date: 08/17/2019 CLINICAL DATA:  Recurrent left upper quadrant abdominal pain. Nausea and vomiting. Recent diagnosis of necrotizing pancreatitis. EXAM: CT ABDOMEN AND PELVIS WITH CONTRAST TECHNIQUE: Multidetector CT imaging of the abdomen and pelvis was performed using the standard protocol following bolus administration of intravenous contrast. CONTRAST:  OMNIPAQUE IOHEXOL 300 MG/ML  SOLN COMPARISON:  Most recent CT 07/29/2019. FINDINGS: Lower chest: Left lower lobe atelectasis with trace left pleural  effusion. Hepatobiliary: No focal hepatic lesion. There are slightly prominent size. Gallbladder partially distended. No calcified gallstone. Slight common bile duct prominence a 7 mm. Pancreas: Peripancreatic fluid collection extending along the body and tail, dominant collection measuring 12.3 x 6.7 cm, not significantly changed from prior exam. Adjacent collection measures 5.9 x 5.2 cm, also not significantly changed. A 2.5 cm collection in the left upper quadrant may be contiguous with the smaller pseudocysts. No air within either of these collections. Mild generalized fat stranding which is grossly stable. No new peripancreatic fluid collection. Calcifications at the pancreatic head again seen. Pancreatic duct not well defined. Spleen: Upper normal in size spanning 13 cm. No splenic infarct or focal abnormality. Adrenals/Urinary Tract: No adrenal nodule. No hydronephrosis. Similar bilateral perinephric edema. Multiple calcifications within bilateral renal pyramids likely combination of nonobstructing stones and nephrocalcinosis. No ureteral calculi. Urinary bladder is partially distended. Stomach/Bowel: Portions of the stomach about the pancreatic pseudocyst. No abnormal gastric distension. No small bowel inflammation or obstruction. Prior appendectomy. The splenic flexure abuts the pancreatic pseudocyst in the left upper quadrant with mild adjacent wall thickening, likely reactive. Small volume of colonic stool. Vascular/Lymphatic: Aortic atherosclerosis. No aortic aneurysm. The portal vein is patent. Splenic vein is attenuated not definitely included. There a few prominent left upper quadrant collaterals. No adenopathy. Reproductive: Uterus and bilateral adnexa are unremarkable. Other: Peripancreatic fluid collections as described. No new intra-abdominal fluid collection. No ascites. Scattered subcutaneous densities in the anterior abdominal wall suggesting medication injection sites. There is no free air.  Musculoskeletal: Degenerative change in the lumbar spine. 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 IMPRESSION: 1. Unchanged pancreatic pseudocysts, largest collection in the left upper quadrant measures 12.3 x 6.7 cm, not significantly changed from exam earlier this month. No air within either of these collections. Mild residual stranding which has slightly improved. No new fluid collection. 2. The stomach and splenic flexure of the colon about these pancreatic collections with mild wall thickening, likely reactive. 3. Sequela of chronic pancreatitis with pancreatic head calcifications. 4. Bilateral nephrolithiasis and nephrocalcinosis. 5. Left lower lobe atelectasis with trace left pleural effusion. 6. Additional stable chronic findings as described. Aortic Atherosclerosis (ICD10-I70.0). Electronically Signed   By: Narda Rutherford M.D.   On: 08/17/2019 00:21        Scheduled Meds: . amitriptyline  75 mg Oral QHS  . hydrALAZINE      . insulin aspart  0-15 Units Subcutaneous TID WC  . insulin aspart  0-5 Units Subcutaneous QHS  . insulin glargine  5 Units Subcutaneous Daily  . lamoTRIgine  200 mg Oral Daily  . lurasidone  60 mg Oral QHS  . metoprolol tartrate  12.5 mg Oral BID  . pantoprazole (PROTONIX) IV  40 mg Intravenous Q12H  . traZODone  100 mg Oral QHS   Continuous Infusions: . sodium chloride 100 mL/hr at 08/18/19 0214  LOS: 1 day    Time spent: 35 Minutes.     Alba Cory, MD [facility]   If 7PM-7AM, please contact night-coverage [email or web address]  08/18/2019, 12:36 PM

## 2019-08-19 ENCOUNTER — Telehealth: Payer: Self-pay

## 2019-08-19 ENCOUNTER — Encounter: Payer: Self-pay | Admitting: *Deleted

## 2019-08-19 DIAGNOSIS — R1013 Epigastric pain: Secondary | ICD-10-CM

## 2019-08-19 DIAGNOSIS — K863 Pseudocyst of pancreas: Principal | ICD-10-CM

## 2019-08-19 DIAGNOSIS — R112 Nausea with vomiting, unspecified: Secondary | ICD-10-CM

## 2019-08-19 LAB — GLUCOSE, CAPILLARY
Glucose-Capillary: 280 mg/dL — ABNORMAL HIGH (ref 70–99)
Glucose-Capillary: 229 mg/dL — ABNORMAL HIGH (ref 70–99)
Glucose-Capillary: 238 mg/dL — ABNORMAL HIGH (ref 70–99)
Glucose-Capillary: 190 mg/dL — ABNORMAL HIGH (ref 70–99)

## 2019-08-19 MED ORDER — METOPROLOL TARTRATE 25 MG PO TABS
25.0000 mg | ORAL_TABLET | Freq: Two times a day (BID) | ORAL | Status: DC
  Administered 2019-08-19 – 2019-08-22 (×8): 25 mg via ORAL
  Filled 2019-08-19 (×8): qty 1

## 2019-08-19 MED ORDER — PANTOPRAZOLE SODIUM 40 MG PO TBEC
40.0000 mg | DELAYED_RELEASE_TABLET | Freq: Two times a day (BID) | ORAL | Status: DC
  Administered 2019-08-19 – 2019-08-22 (×8): 40 mg via ORAL
  Filled 2019-08-19 (×8): qty 1

## 2019-08-19 MED ORDER — ACETAMINOPHEN 325 MG PO TABS: 650.0000 mg | ORAL_TABLET | Freq: Four times a day (QID) | ORAL | 0 refills | Status: DC | PRN

## 2019-08-19 MED ORDER — HYDRALAZINE HCL 25 MG PO TABS
25.0000 mg | ORAL_TABLET | Freq: Four times a day (QID) | ORAL | Status: DC | PRN
  Administered 2019-08-19 – 2019-08-25 (×5): 25 mg via ORAL
  Filled 2019-08-19 (×5): qty 1

## 2019-08-19 MED ORDER — ONDANSETRON HCL 8 MG PO TABS: 8.0000 mg | ORAL_TABLET | Freq: Three times a day (TID) | ORAL | 0 refills | Status: DC | PRN

## 2019-08-19 MED ORDER — PANTOPRAZOLE SODIUM 40 MG PO TBEC: 40.0000 mg | DELAYED_RELEASE_TABLET | Freq: Two times a day (BID) | ORAL | 1 refills | Status: DC

## 2019-08-19 MED ORDER — METOPROLOL TARTRATE 25 MG PO TABS: 25.0000 mg | ORAL_TABLET | Freq: Two times a day (BID) | ORAL | 0 refills | Status: DC

## 2019-08-19 NOTE — Progress Notes (Signed)
RN paged MD regarding BP. No new orders.

## 2019-08-19 NOTE — Progress Notes (Signed)
Inpatient Diabetes Program Recommendations  AACE/ADA: New Consensus Statement on Inpatient Glycemic Control (2015)  Target Ranges:  Prepandial:   less than 140 mg/dL      Peak postprandial:   less than 180 mg/dL (1-2 hours)      Critically ill patients:  140 - 180 mg/dL   Lab Results  Component Value Date   GLUCAP 280 (H) 08/19/2019   HGBA1C 7.2 (H) 07/17/2019    Review of Glycemic Control  Diabetes history: DM2 Outpatient Diabetes medications: Lantus 30 units QD, Trulicity 4.5 mg weekly on Wed Current orders for Inpatient glycemic control: Lantus 10 units QD, Novolog 0-15 units tidwc and hs  Inpatient Diabetes Program Recommendations:     Increase Lantus to 20 units QD  Continue to follow.   Thank you. Ailene Ards, RD, LDN, CDE Inpatient Diabetes Coordinator [phone]

## 2019-08-19 NOTE — Progress Notes (Signed)
PROGRESS NOTE    Lauranne Beyersdorf  XAJ:[id number] DOB: Sep 10, 1965 DOA: 08/16/2019 PCP: Knox Royalty, MD   Brief Narrative: 54 year old with past medical history significant for ID DM2, hypertension, gastritis, chronic pancreatitis with pseudocyst, morbid obesity who presents complaining of abdominal pain and vomiting a small amount of blood.  -Patient last hospitalization from 08/02/2019 130 07/1618 21 for epigastric abdominal pain nausea vomiting thought to be secondary to gastritis esophagitis.  Renal admission she had an MRCP which showed 12.6 cm pseudocyst, unchanged from recent CT scan. 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     Assessment & Plan:   Active Problems:   AKI (acute kidney injury) (HCC)   Type 2 diabetes mellitus (HCC)   Schizophrenia (HCC)   Obesity, Class III, BMI 40-49.9 (morbid obesity) (HCC)   Acute pancreatitis   Pancreatic pseudocyst   Abdominal pain   Essential hypertension   Non-intractable vomiting   Hematemesis with nausea  1-Acute on chronic abdominal pain, chronic pancreatitis, pancreatic pseudocyst: Continue with IV fluids. Continue with IV pain meds and Zofran as needed for nausea. GI has been consulted.  Follow GI recommendation in regards pancreatic pseudocyst.  Plan to resume low fat diet.  Vomited again last night. Await GI recommendations .   2-Hematemesis, questionable melena  Change Protonix to IV twice daily. Check hemoglobin every 8 hours. No need for blood transfusion at this time. GI has been consulted History of Candida esophagitis: She should have  completed treatment. Endoscopy 3-28 which was normal. Mild to moderate extrinsic compression in the region of duodenal bulb from Pancreatic pseudocyst.    3-Diarrhea: We will consider start pancreatic enzymes.  Hypokalemia: Resolved AKI; hypovolemia.  Resolved.  Diabetes type 2 insulin-dependent CBG elevated at 300 range. Continue with low-dose Lantus. Continue with sliding scale insulin.  Asthma: Continue with albuterol Hypertension; resume norvasc.  Anxiety: Continue with clonazepam as needed Depression: Continue with amitriptyline and trazodone Schizophrenia BPD: Continue with Lamictal and Latuda    Estimated body mass index is 44.15 kg/m as calculated from the following:   Height as of this encounter: 5\' 9"  (1.753 m).   Weight as of this encounter: 135.6 kg.   DVT prophylaxis: SCD Code Status: Full code Family Communication: Care discussed directly with patient Disposition Plan:  Patient is from: Home Anticipated d/c date: Home when able to tolerates diet.  Barriers to d/c or necessity for inpatient status: Still vomiting. Awaiting GI recommendation   Consultants:   GI  Procedures:   None  Antimicrobials:  None  Subjective: She vomited multiples times last night.   Objective: Vitals:   08/18/19 2114 08/19/19 0234 08/19/19 0548 08/19/19 0935  BP: (!) 142/90 (!) 156/98 (!) 148/93 (!) 140/104  Pulse: (!) 106 (!) 105 (!) 102 98  Resp: 16 17 15 18   Temp: 98.2 F (36.8 C) 98.3 F (36.8 C) 98.2 F (36.8 C) 98.6 F (37 C)  TempSrc:  Oral Oral Oral  SpO2: 95% 97% 98% 100%  Weight:      Height:        Intake/Output Summary (Last 24 hours) at 08/19/2019 1134 Last data filed at 08/19/2019 0816 Gross per 24 hour  Intake 2875.07 ml  Output 300 ml  Net 2575.07 ml   Filed Weights   08/17/19 0529 08/18/19 1059  Weight: (!) 138.4 kg 135.6 kg  Examination:  General exam: NAD Respiratory system: CTA Cardiovascular system: S 1, S 2 RRR Gastrointestinal system: BS present, soft, nt Central nervous system: non focal.  Extremities: symmetric power  Data Reviewed: I have personally reviewed following  labs and imaging studies  CBC: Recent Labs  Lab 08/16/19 1920 08/16/19 1920 08/17/19 0522 08/17/19 1229 08/17/19 2024 08/18/19 0453 08/18/19 1230  WBC 7.9   < > 5.4 5.9 6.7 5.5 5.7  NEUTROABS 4.1  --   --   --   --   --   --   HGB 10.5*   < > 9.8* 10.6* 9.6* 9.9* 10.4*  HCT 36.0   < > 34.6* 37.5 33.3* 33.9* 36.4  MCV 78.9*   < > 78.8* 78.5* 78.5* 77.0* 77.6*  PLT 297   < > 223 273 207 183 249   < > = values in this interval not displayed.   Basic Metabolic Panel: Recent Labs  Lab 08/16/19 1920 08/17/19 0522 08/18/19 0453  NA 134* 137 134*  K 3.6 3.3* 3.7  CL 98 104 102  CO2 24 24 21*  GLUCOSE 403* 250* 227*  BUN 11 12 6   CREATININE 1.10* 0.92 0.77  CALCIUM 9.3 8.7* 8.4*   GFR: Estimated Creatinine Clearance: 122.1 mL/min (by C-G formula based on SCr of 0.77 mg/dL). Liver Function Tests: Recent Labs  Lab 08/16/19 1920 08/17/19 0522  AST 34 28  ALT 25 23  ALKPHOS 77 71  BILITOT 0.7 0.6  PROT 7.0 6.4*  ALBUMIN 3.7 3.4*   Recent Labs  Lab 08/16/19 1920  LIPASE 35   No results for input(s): AMMONIA in the last 168 hours. Coagulation Profile: No results for input(s): INR, PROTIME in the last 168 hours. Cardiac Enzymes: No results for input(s): CKTOTAL, CKMB, CKMBINDEX, TROPONINI in the last 168 hours. BNP (last 3 results) No results for input(s): PROBNP in the last 8760 hours. HbA1C: No results for input(s): HGBA1C in the last 72 hours. CBG: Recent Labs  Lab 08/18/19 0721 08/18/19 1248 08/18/19 1631 08/18/19 2142 08/19/19 0733  GLUCAP 219* 186* 308* 224* 280*   Lipid Profile: No results for input(s): CHOL, HDL, LDLCALC, TRIG, CHOLHDL, LDLDIRECT in the last 72 hours. Thyroid Function Tests: No results for input(s): TSH, T4TOTAL, FREET4, T3FREE, THYROIDAB in the last 72 hours. Anemia Panel: No results for input(s): VITAMINB12, FOLATE, FERRITIN, TIBC, IRON, RETICCTPCT in the last 72 hours. Sepsis Labs: No results for input(s): PROCALCITON,  LATICACIDVEN in the last 168 hours.  Recent Results (from the past 240 hour(s))  SARS CORONAVIRUS 2 (TAT 6-24 HRS) Nasopharyngeal Nasopharyngeal Swab     Status: None   Collection Time: 08/17/19  2:10 AM   Specimen: Nasopharyngeal Swab  Result Value Ref Range Status   SARS Coronavirus 2 NEGATIVE NEGATIVE Final    Comment: (NOTE) SARS-CoV-2 target nucleic acids are NOT DETECTED. The SARS-CoV-2 RNA is generally detectable in upper and lower respiratory specimens during the acute phase of infection. Negative results do not preclude SARS-CoV-2 infection, do not rule out co-infections with other pathogens, and should not be used as the sole basis for treatment or other patient management decisions. Negative results must be combined with clinical observations, patient history, and epidemiological information. The expected result is Negative. Fact Sheet for Patients: SugarRoll.be Fact Sheet for Healthcare Providers: https://www.woods-mathews.com/ This test is not yet approved or cleared by the [place] FDA and  has been authorized for detection and/or diagnosis of SARS-CoV-2 by FDA under an Emergency Use Authorization (EUA).  This EUA will remain  in effect (meaning this test can be used) for the duration of the COVID-19 declaration under Section 56 4(b)(1) of the Act, 21 U.S.C. section 360bbb-3(b)(1), unless the authorization is terminated or revoked sooner. Performed at [facility], [address], [place], [place] [address]          Radiology Studies: No results found.      Scheduled Meds: . amitriptyline  75 mg Oral QHS  . amLODipine  10 mg Oral Daily  . insulin aspart  0-15 Units Subcutaneous TID WC  . insulin aspart  0-5 Units Subcutaneous QHS  . insulin glargine  10 Units Subcutaneous Daily  . lamoTRIgine  200 mg Oral Daily  . lurasidone  60 mg Oral QHS  . metoprolol tartrate  25 mg Oral BID  . pantoprazole  40  mg Oral Q12H  . traZODone  100 mg Oral QHS   Continuous Infusions: . sodium chloride 100 mL/hr at 08/19/19 0256     LOS: 2 days    Time spent: 35 Minutes.     Alba Cory, MD [facility]   If 7PM-7AM, please contact night-coverage [email or web address]  08/19/2019, 11:34 AM

## 2019-08-19 NOTE — Progress Notes (Signed)
Progress Note    ASSESSMENT AND PLAN:   Called back to see patient for ongoing nausea / vomiting.   Brief history, obtained from our consult note on 3/27   54 yo female with pmh significant for anxiety, depression schizophrenia, asthma, DM. She also has a history of severe acute necrotizing pancreatitis at [facility] late January 2021 through mid February 2021. Hospitalized late February with nausea / poor po intake, anemia and electrolyte abnormalities. CT scan showed fluid collections involving pancreas / pancreatic inflammation, several edematous loops of small bowel and thickening at splenic flexure. Underwent EGD by Dr. Barron Alvine 07/18/19 with findings of candida esophagitis and gastritis. Treated with Diflucan. Discharged home on 2/28, ? Maybe for eventual cystogastrostomy.   Readmitted 3/12 for N/V and epigastric pain. MRCP showed stable peripancreatic fluid collections. Discharged home on 3/16.   I3/26/21. Abdominal pain never completely resolved. She developed diarrhea ( ? Black) and developed hematemesis. We saw her in consult 08/17/19. Repeat CT scan with contrast was done and pseudocysts were unchanges unchanged. She underwent EGD by Dr. Marina Goodell yesterday. Exam basically normal except for possible mild to moderate extrinsic compression in the region of the duodenal bulb from known pancreatic pseudocyst. She was given an appt to see Dr. Barron Alvine on 09/03/19. We were called back today out of concern for ongoing vomiting.    # Persistent nausea, vomiting  --Last episode of vomiting was around 2am, she had eaten around 6 pm --Delayed gastric emptying?  Blood sugars have been a little high at times, could be contributing to some delayed gastric emptying. She isn't taking much in the way of narcotics. Only 3 doses earlier yesterday. No pain meds today.  No N/V today but she hasn't eaten either. Even if she has extrinsic compression from pseudocyst I would think she could tolerate small  amounts of PO so N/V probably not anatomical. --Increasing Zofran to 4 gm IV Q 6 hours --Will follow      SUBJECTIVE    Says she can't eat anything. Vomits several hours after eating. No vomiting today, last episode was 2 am   OBJECTIVE:     Vital signs in last 24 hours: Temp:  [97.8 F (36.6 C)-98.6 F (37 C)] 98.6 F (37 C) (03/29 0935) Pulse Rate:  [98-123] 98 (03/29 0935) Resp:  [15-18] 18 (03/29 0935) BP: (140-156)/(66-104) 140/104 (03/29 0935) SpO2:  [95 %-100 %] 100 % (03/29 0935) Last BM Date: 08/16/19 General:   Alert  in NAD EENT:  Normal hearing, non icteric sclera   Heart:  Regular rate and rhythm;  No lower extremity edema   Pulm: Normal respiratory effort   Abdomen:  Soft, nondistended, nontender.  Normal bowel sounds.          Neurologic:  Alert and  oriented x4;  grossly normal neurologically. Psych:  Pleasant, cooperative.  Normal mood and affect.   Intake/Output from previous day: 03/28 0701 - 03/29 0700 In: 2975.1 [P.O.:180; I.V.:2295.1; IV Piggyback:500] Out: -  Intake/Output this shift: Total I/O In: -  Out: 300 [Urine:300]  Lab Results: Recent Labs    08/17/19 2024 08/18/19 0453 08/18/19 1230  WBC 6.7 5.5 5.7  HGB 9.6* 9.9* 10.4*  HCT 33.3* 33.9* 36.4  PLT 207 183 249   BMET Recent Labs    08/16/19 1920 08/17/19 0522 08/18/19 0453  NA 134* 137 134*  K 3.6 3.3* 3.7  CL 98 104 102  CO2 24 24 21*  GLUCOSE 403* 250* 227*  BUN  11 12 6   CREATININE 1.10* 0.92 0.77  CALCIUM 9.3 8.7* 8.4*   LFT Recent Labs    08/17/19 0522  PROT 6.4*  ALBUMIN 3.4*  AST 28  ALT 23  ALKPHOS 79  BILITOT 0.6     Active Problems:   AKI (acute kidney injury) ([place])   Type 2 diabetes mellitus (HCC)   Schizophrenia (HCC)   Obesity, Class III, BMI 40-49.9 (morbid obesity) ([place])   Acute pancreatitis   Pancreatic pseudocyst   Abdominal pain   Essential hypertension   Non-intractable vomiting   Hematemesis with nausea     LOS: 2 days    Tye Savoy ,NP 08/19/2019, 12:44 PM

## 2019-08-19 NOTE — Telephone Encounter (Signed)
Records received, placed in folder for [place] review.

## 2019-08-19 NOTE — Telephone Encounter (Signed)
-----   Message from Arnaldo Natal, NP sent at 08/17/2019  2:46 PM EDT ----- Monia Pouch, can you get a copy of the patient's recent colonoscopy done by Dr. Cloretta Ned at [facility] in [facility]. thx

## 2019-08-19 NOTE — Telephone Encounter (Signed)
Records have been requested via fax and phone from [facility]-patient has also been sent at [facility] (records available through [facility]); will place on provider's desk when received;   Tracy-please be aware these records have been requested-please given to [place], NP when they become available;

## 2019-08-19 NOTE — Progress Notes (Signed)
Patient verbalizes wanting to leave AMA. RN made patient aware that symptoms may be unresolved and may come back if not properly treated. Patient verbalized she felt better and "is tired of waiting on the doctor to come see me". [facility] and GI notified of this and advised to inform patient that the doctor is in a procedure and is coming afterwards.

## 2019-08-20 DIAGNOSIS — K862 Cyst of pancreas: Secondary | ICD-10-CM

## 2019-08-20 DIAGNOSIS — R935 Abnormal findings on diagnostic imaging of other abdominal regions, including retroperitoneum: Secondary | ICD-10-CM

## 2019-08-20 DIAGNOSIS — Z8719 Personal history of other diseases of the digestive system: Secondary | ICD-10-CM

## 2019-08-20 LAB — CBC
HCT: 36.3 % (ref 36.0–46.0)
Hemoglobin: 10.4 g/dL — ABNORMAL LOW (ref 12.0–15.0)
MCH: 22.4 pg — ABNORMAL LOW (ref 26.0–34.0)
MCHC: 28.7 g/dL — ABNORMAL LOW (ref 30.0–36.0)
MCV: 78.2 fL — ABNORMAL LOW (ref 80.0–100.0)
Platelets: 243 10*3/uL (ref 150–400)
RBC: 4.64 MIL/uL (ref 3.87–5.11)
RDW: 15.4 % (ref 11.5–15.5)
WBC: 4.7 10*3/uL (ref 4.0–10.5)
nRBC: 0 % (ref 0.0–0.2)

## 2019-08-20 LAB — HEPATIC FUNCTION PANEL
ALT: 18 U/L (ref 0–44)
AST: 27 U/L (ref 15–41)
Albumin: 3.6 g/dL (ref 3.5–5.0)
Alkaline Phosphatase: 64 U/L (ref 38–126)
Bilirubin, Direct: 0.2 mg/dL (ref 0.0–0.2)
Indirect Bilirubin: 0.1 mg/dL — ABNORMAL LOW (ref 0.3–0.9)
Total Bilirubin: 0.3 mg/dL (ref 0.3–1.2)
Total Protein: 6.9 g/dL (ref 6.5–8.1)

## 2019-08-20 LAB — BASIC METABOLIC PANEL
Anion gap: 12 (ref 5–15)
BUN: 7 mg/dL (ref 6–20)
CO2: 21 mmol/L — ABNORMAL LOW (ref 22–32)
Calcium: 8.8 mg/dL — ABNORMAL LOW (ref 8.9–10.3)
Chloride: 104 mmol/L (ref 98–111)
Creatinine, Ser: 0.77 mg/dL (ref 0.44–1.00)
GFR calc Af Amer: >60 mL/min (ref >60)
GFR calc non Af Amer: >60 mL/min (ref >60)
Glucose, Bld: 301 mg/dL — ABNORMAL HIGH (ref 70–99)
Potassium: 3.6 mmol/L (ref 3.5–5.1)
Sodium: 137 mmol/L (ref 135–145)

## 2019-08-20 LAB — GLUCOSE, CAPILLARY
Glucose-Capillary: 268 mg/dL — ABNORMAL HIGH (ref 70–99)
Glucose-Capillary: 373 mg/dL — ABNORMAL HIGH (ref 70–99)
Glucose-Capillary: 227 mg/dL — ABNORMAL HIGH (ref 70–99)
Glucose-Capillary: 204 mg/dL — ABNORMAL HIGH (ref 70–99)

## 2019-08-20 LAB — PHOSPHORUS: Phosphorus: 3.6 mg/dL (ref 2.5–4.6)

## 2019-08-20 LAB — PROTIME-INR
INR: 0.9 (ref 0.8–1.2)
Prothrombin Time: 11.9 seconds (ref 11.4–15.2)

## 2019-08-20 LAB — MAGNESIUM: Magnesium: 1.7 mg/dL (ref 1.7–2.4)

## 2019-08-20 MED ORDER — SODIUM CHLORIDE 0.9 % IV BOLUS
500.0000 mL | Freq: Once | INTRAVENOUS | Status: AC
  Administered 2019-08-20: 500 mL via INTRAVENOUS

## 2019-08-20 MED ORDER — INSULIN ASPART 100 UNIT/ML ~~LOC~~ SOLN
2.0000 [IU] | Freq: Three times a day (TID) | SUBCUTANEOUS | Status: DC
  Administered 2019-08-20 – 2019-08-23 (×5): 2 [IU] via SUBCUTANEOUS

## 2019-08-20 MED ORDER — POLYETHYLENE GLYCOL 3350 17 G PO PACK
17.0000 g | PACK | Freq: Two times a day (BID) | ORAL | Status: DC
  Administered 2019-08-20 – 2019-08-27 (×8): 17 g via ORAL
  Filled 2019-08-20 (×12): qty 1

## 2019-08-20 NOTE — Progress Notes (Signed)
     Progress Note    ASSESSMENT AND PLAN:   # Nausea / vomiting. Better.  --ate only 1/2 sandwich last night and no vomiting since.  --Continue small portions --Patient was gong to leave AMA.  --Dr. Mansouraty has reviewed case. He has made arrangements to proceed with pancreatic cyst gastrostomy tomorrow at noon. I reviewed the procedure with her ( Dr. Mansouraty will give a more in depth description of procedure).  --Requested pre-procedure labs:  CBC, Mg+, Phos INR and CMET (she had BMET today, will add liver tests)   --NPO after MN   SUBJECTIVE   No nausea / vomiting last night (had 1/2 sandwich for dinner) or this am.     OBJECTIVE:     Vital signs in last 24 hours: Temp:  [97.8 F (36.6 C)-99.3 F (37.4 C)] 97.8 F (36.6 C) (03/30 0621) Pulse Rate:  [90-107] 107 (03/30 0942) Resp:  [16-18] 16 (03/30 0621) BP: (129-178)/(81-123) 149/89 (03/30 0942) SpO2:  [96 %-100 %] 96 % (03/30 0621) Last BM Date: 08/16/19 General:   Pleasant, up making her bed.  Pulm: Normal respiratory effort   Abdomen:  Soft, nondistended, nontender.  Normal bowel sounds.          Neurologic:  Alert and  oriented x4;  grossly normal neurologically. Psych:  Pleasant, cooperative.  Normal mood and affect.   Intake/Output from previous day: 03/29 0701 - 03/30 0700 In: 1266 [P.O.:360; I.V.:906] Out: 1200 [Urine:1200] Intake/Output this shift: Total I/O In: 240 [P.O.:240] Out: -   Lab Results: Recent Labs    08/17/19 2024 08/18/19 0453 08/18/19 1230  WBC 6.7 5.5 5.7  HGB 9.6* 9.9* 10.4*  HCT 33.3* 33.9* 36.4  PLT 207 183 249   BMET Recent Labs    08/18/19 0453 08/20/19 0823  NA 134* 137  K 3.7 3.6  CL 102 104  CO2 21* 21*  GLUCOSE 227* 301*  BUN 6 7  CREATININE 0.77 0.77  CALCIUM 8.4* 8.8*    Active Problems:   AKI (acute kidney injury) (HCC)   Type 2 diabetes mellitus (HCC)   Schizophrenia (HCC)   Obesity, Class III, BMI 40-49.9 (morbid obesity) (HCC)   Acute  pancreatitis   Pancreatic pseudocyst   Abdominal pain   Essential hypertension   Non-intractable vomiting   Hematemesis with nausea     LOS: 3 days   Tommi Crepeau ,NP 08/20/2019, 10:01 AM       

## 2019-08-20 NOTE — H&P (View-Only) (Signed)
     Progress Note    ASSESSMENT AND PLAN:   # Nausea / vomiting. Better.  --ate only 1/2 sandwich last night and no vomiting since.  --Continue small portions --Patient was gong to leave AMA.  --Dr. Meridee Score has reviewed case. He has made arrangements to proceed with pancreatic cyst gastrostomy tomorrow at noon. I reviewed the procedure with her ( Dr. Meridee Score will give a more in depth description of procedure).  --Requested pre-procedure labs:  CBC, Mg+, Phos INR and CMET (she had BMET today, will add liver tests)   --NPO after MN   SUBJECTIVE   No nausea / vomiting last night (had 1/2 sandwich for dinner) or this am.     OBJECTIVE:     Vital signs in last 24 hours: Temp:  [97.8 F (36.6 C)-99.3 F (37.4 C)] 97.8 F (36.6 C) (03/30 0621) Pulse Rate:  [90-107] 107 (03/30 0942) Resp:  [16-18] 16 (03/30 0621) BP: (129-178)/(81-123) 149/89 (03/30 0942) SpO2:  [96 %-100 %] 96 % (03/30 0621) Last BM Date: 08/16/19 General:   Pleasant, up making her bed.  Pulm: Normal respiratory effort   Abdomen:  Soft, nondistended, nontender.  Normal bowel sounds.          Neurologic:  Alert and  oriented x4;  grossly normal neurologically. Psych:  Pleasant, cooperative.  Normal mood and affect.   Intake/Output from previous day: 03/29 0701 - 03/30 0700 In: 1266 [P.O.:360; I.V.:906] Out: 1200 [Urine:1200] Intake/Output this shift: Total I/O In: 240 [P.O.:240] Out: -   Lab Results: Recent Labs    08/17/19 2024 08/18/19 0453 08/18/19 1230  WBC 6.7 5.5 5.7  HGB 9.6* 9.9* 10.4*  HCT 33.3* 33.9* 36.4  PLT 207 183 249   BMET Recent Labs    08/18/19 0453 08/20/19 0823  NA 134* 137  K 3.7 3.6  CL 102 104  CO2 21* 21*  GLUCOSE 227* 301*  BUN 6 7  CREATININE 0.77 0.77  CALCIUM 8.4* 8.8*    Active Problems:   AKI (acute kidney injury) (HCC)   Type 2 diabetes mellitus (HCC)   Schizophrenia (HCC)   Obesity, Class III, BMI 40-49.9 (morbid obesity) (HCC)   Acute  pancreatitis   Pancreatic pseudocyst   Abdominal pain   Essential hypertension   Non-intractable vomiting   Hematemesis with nausea     LOS: 3 days   Willette Cluster ,NP 08/20/2019, 10:01 AM

## 2019-08-20 NOTE — Progress Notes (Signed)
PROGRESS NOTE    Monique Holland  JQB:[id number] DOB: September 17, 1965 DOA: 08/16/2019 PCP: Knox Royalty, MD   Brief Narrative: 54 year old with past medical history significant for ID DM2, hypertension, gastritis, chronic pancreatitis with pseudocyst, morbid obesity who presents complaining of abdominal pain and vomiting a small amount of blood.  -Patient last hospitalization from 08/02/2019 130 07/1618 21 for epigastric abdominal pain nausea vomiting thought to be secondary to gastritis esophagitis.  Renal admission she had an MRCP which showed 12.6 cm pseudocyst, unchanged from recent CT scan. 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  Patient admitted with recurrent vomiting.  Underwent endoscopy which was normal except for Mild to moderate extrinsic compression in the region of duodenal bulb from Pancreatic pseudocyst.  GI planning EGD EUS and possible cystgastrostomy creation tomorrow.     Assessment & Plan:   Active Problems:   AKI (acute kidney injury) (HCC)   Type 2 diabetes mellitus (HCC)   Schizophrenia (HCC)   Obesity, Class III, BMI 40-49.9 (morbid obesity) (HCC)   Acute pancreatitis   Pancreatic pseudocyst   Abdominal pain   Essential hypertension   Non-intractable vomiting   Hematemesis with nausea  1-Acute on chronic abdominal pain, chronic pancreatitis, pancreatic pseudocyst: Persistent vomiting Continue with IV fluids. Continue with IV pain meds and Zofran as needed for nausea. GI has been consulted.  Continue with low fat diet.  Endoscopy 3-28 which was normal. Mild to moderate extrinsic compression in the region of duodenal bulb from Pancreatic pseudocyst.  Plan to proceed with EUS and possible cystogastrostomy creation.  2-Hematemesis,  questionable melena  Change Protonix to IV twice daily. Check hemoglobin every 8 hours. No need for blood transfusion at this time. GI has been consulted History of Candida esophagitis: She should have  completed treatment. Endoscopy 3-28 which was normal. Mild to moderate extrinsic compression in the region of duodenal bulb from Pancreatic pseudocyst.   3-Diarrhea: We will consider start pancreatic enzymes.  Hypokalemia: Resolved AKI; hypovolemia.  Resolved.  Diabetes type 2 insulin-dependent Continue with low-dose Lantus. 10 units  Continue with sliding scale insulin.  Asthma: Continue with albuterol Hypertension; resume norvasc.  Anxiety: Continue with clonazepam as needed Depression: Continue with amitriptyline and trazodone Schizophrenia BPD: Continue with Lamictal and Latuda    Estimated body mass index is 44.15 kg/m as calculated from the following:   Height as of this encounter: 5\' 9"  (1.753 m).   Weight as of this encounter: 135.6 kg.   DVT prophylaxis: SCD Code Status: Full code Family Communication: Care discussed directly with patient Disposition Plan:  Patient is from: Home Anticipated d/c date: Home when able to tolerates diet.  Barriers to d/c or necessity for inpatient status: Plan to proceed with EUS on 3/31st  Consultants:   GI  Procedures:   None  Antimicrobials:  None  Subjective: Feels well, agree to stay for procedure tomorrow.   Objective: Vitals:   08/19/19 1616 08/19/19 2100 08/20/19 0621 08/20/19 0942  BP: (!) 152/97 (!) 161/105 129/81 (!) 149/89  Pulse: (!) 101 90 96 (!) 107  Resp:  18 16   Temp:  98.5 F (36.9 C) 97.8 F (36.6 C)   TempSrc:  Oral Oral   SpO2:  99% 96%   Weight:      Height:        Intake/Output Summary (Last  24 hours) at 08/20/2019 1351 Last data filed at 08/20/2019 5885 Gross per 24 hour  Intake 1506.03 ml  Output 900 ml  Net 606.03 ml   Filed Weights   08/17/19 0529 08/18/19 1059  Weight: (!)  138.4 kg 135.6 kg    Examination:  General exam: NAD Respiratory system: CTA Cardiovascular system: S 1, S 2 RRR Gastrointestinal system: BS present, soft, nt Central nervous system: Non focal.  Extremities: Symmetric power.   Data Reviewed: I have personally reviewed following labs and imaging studies  CBC: Recent Labs  Lab 08/16/19 1920 08/17/19 0522 08/17/19 1229 08/17/19 2024 08/18/19 0453 08/18/19 1230 08/20/19 0031  WBC 7.9   < > 5.9 6.7 5.5 5.7 4.7  NEUTROABS 4.1  --   --   --   --   --   --   HGB 10.5*   < > 10.6* 9.6* 9.9* 10.4* 10.4*  HCT 36.0   < > 37.5 33.3* 33.9* 36.4 36.3  MCV 78.9*   < > 78.5* 78.5* 77.0* 77.6* 78.2*  PLT 297   < > 273 207 183 249 243   < > = values in this interval not displayed.   Basic Metabolic Panel: Recent Labs  Lab 08/16/19 1920 08/17/19 0522 08/18/19 0453 08/20/19 0031 08/20/19 0823  NA 134* 137 134*  --  137  K 3.6 3.3* 3.7  --  3.6  CL 98 104 102  --  104  CO2 24 24 21*  --  21*  GLUCOSE 403* 250* 227*  --  301*  BUN 11 12 6   --  7  CREATININE 1.10* 0.92 0.77  --  0.77  CALCIUM 9.3 8.7* 8.4*  --  8.8*  MG  --   --   --  1.7  --   PHOS  --   --   --  3.6  --    GFR: Estimated Creatinine Clearance: 122.1 mL/min (by C-G formula based on SCr of 0.77 mg/dL). Liver Function Tests: Recent Labs  Lab 08/16/19 1920 08/17/19 0522 08/20/19 0031  AST 34 28 27  ALT 25 23 18   ALKPHOS 77 71 64  BILITOT 0.7 0.6 0.3  PROT 7.0 6.4* 6.9  ALBUMIN 3.7 3.4* 3.6   Recent Labs  Lab 08/16/19 1920  LIPASE 35   No results for input(s): AMMONIA in the last 168 hours. Coagulation Profile: Recent Labs  Lab 08/20/19 0031  INR 0.9   Cardiac Enzymes: No results for input(s): CKTOTAL, CKMB, CKMBINDEX, TROPONINI in the last 168 hours. BNP (last 3 results) No results for input(s): PROBNP in the last 8760 hours. HbA1C: No results for input(s): HGBA1C in the last 72 hours. CBG: Recent Labs  Lab 08/19/19 0733 08/19/19 1228  08/19/19 1644 08/19/19 2055 08/20/19 0718  GLUCAP 280* 229* 238* 190* 268*   Lipid Profile: No results for input(s): CHOL, HDL, LDLCALC, TRIG, CHOLHDL, LDLDIRECT in the last 72 hours. Thyroid Function Tests: No results for input(s): TSH, T4TOTAL, FREET4, T3FREE, THYROIDAB in the last 72 hours. Anemia Panel: No results for input(s): VITAMINB12, FOLATE, FERRITIN, TIBC, IRON, RETICCTPCT in the last 72 hours. Sepsis Labs: No results for input(s): PROCALCITON, LATICACIDVEN in the last 168 hours.  Recent Results (from the past 240 hour(s))  SARS CORONAVIRUS 2 (TAT 6-24 HRS) Nasopharyngeal Nasopharyngeal Swab     Status: None   Collection Time: 08/17/19  2:10 AM   Specimen: Nasopharyngeal Swab  Result Value Ref Range Status   SARS Coronavirus 2 NEGATIVE NEGATIVE Final  Comment: (NOTE) SARS-CoV-2 target nucleic acids are NOT DETECTED. The SARS-CoV-2 RNA is generally detectable in upper and lower respiratory specimens during the acute phase of infection. Negative results do not preclude SARS-CoV-2 infection, do not rule out co-infections with other pathogens, and should not be used as the sole basis for treatment or other patient management decisions. Negative results must be combined with clinical observations, patient history, and epidemiological information. The expected result is Negative. Fact Sheet for Patients: SugarRoll.be Fact Sheet for Healthcare Providers: https://www.woods-mathews.com/ This test is not yet approved or cleared by the [place] FDA and  has been authorized for detection and/or diagnosis of SARS-CoV-2 by FDA under an Emergency Use Authorization (EUA). This EUA will remain  in effect (meaning this test can be used) for the duration of the COVID-19 declaration under Section 56 4(b)(1) of the Act, 21 U.S.C. section 360bbb-3(b)(1), unless the authorization is terminated or revoked sooner. Performed at [facility], [place] [address] [place], [place] [address]          Radiology Studies: No results found.      Scheduled Meds: . amitriptyline  75 mg Oral QHS  . amLODipine  10 mg Oral Daily  . insulin aspart  0-15 Units Subcutaneous TID WC  . insulin aspart  0-5 Units Subcutaneous QHS  . insulin glargine  10 Units Subcutaneous Daily  . lamoTRIgine  200 mg Oral Daily  . lurasidone  60 mg Oral QHS  . metoprolol tartrate  25 mg Oral BID  . pantoprazole  40 mg Oral Q12H  . traZODone  100 mg Oral QHS   Continuous Infusions: . sodium chloride 100 mL/hr at 08/20/19 0609     LOS: 3 days    Time spent: 35 Minutes.     Elmarie Shiley, MD [facility]   If 7PM-7AM, please contact night-coverage [email or web address]  08/20/2019, 1:51 PM

## 2019-08-20 NOTE — Progress Notes (Signed)
Patient is requesting her diet to be changed from [facility] to regular because she can't get anything she likes due to the sodium and sugar restrictions. MD paged with this request.

## 2019-08-20 NOTE — Care Management Important Message (Signed)
Important Message  Patient Details IM Letter given to Amada Jupiter SW Case Manager to present to the Patient Name: Nelta Caudill MRN: [id number] Date of Birth: 1965/11/10   Medicare Important Message Given:  Yes     Caren Macadam 08/20/2019, 10:56 AM

## 2019-08-20 NOTE — TOC Initial Note (Signed)
Transition of Care [facility]) - Initial/Assessment Note    Patient Details  Name: Monique Holland MRN: [id number] Date of Birth: 04/28/1966  Transition of Care [facility]) CM/SW Contact:    Lennart Pall, LCSW Phone Number: 08/20/2019, 3:19 PM  Clinical Narrative:  Met with pt today to review support system. Pt living with her spouse who does work but has two sons in the area.  Pt denies any concerns about d/c and hopes this may be possible tomorrow after procedure.  Pt following with her primary MD as well as psychiatry at [facility], Angie Fava, FNP. Will monitor for any d/c needs.                 Expected Discharge Plan: Home/Self Care Barriers to Discharge: Continued Medical Work up   Patient Goals and CMS Choice Patient states their goals for this hospitalization and ongoing recovery are:: to get home ASAP      Expected Discharge Plan and Services Expected Discharge Plan: Home/Self Care In-house Referral: Clinical Social Work     Living arrangements for the past 2 months: Single Family Home Expected Discharge Date: 08/19/19                                    Prior Living Arrangements/Services Living arrangements for the past 2 months: Single Family Home Lives with:: Minor Children, Spouse Patient language and need for interpreter reviewed:: Yes Do you feel safe going back to the place where you live?: Yes      Need for Family Participation in Patient Care: No (Comment) Care giver support system in place?: Yes (comment)   Criminal Activity/Legal Involvement Pertinent to Current Situation/Hospitalization: No - Comment as needed  Activities of Daily Living Home Assistive Devices/Equipment: None ADL Screening (condition at time of admission) Patient's cognitive ability adequate to safely complete daily activities?: Yes Is the patient deaf or have difficulty hearing?: No Does the patient have difficulty seeing, even when wearing glasses/contacts?:  No Does the patient have difficulty concentrating, remembering, or making decisions?: No Patient able to express need for assistance with ADLs?: Yes Does the patient have difficulty dressing or bathing?: No Independently performs ADLs?: Yes (appropriate for developmental age) Does the patient have difficulty walking or climbing stairs?: Yes Weakness of Legs: None Weakness of Arms/Hands: None  Permission Sought/Granted Permission sought to share information with : Family Supports Permission granted to share information with : Yes, Verbal Permission Granted  Share Information with NAME: Jasiyah Poland     Permission granted to share info w Relationship: spouse  Permission granted to share info w Contact Information: [phone]  Emotional Assessment Appearance:: Appears stated age Attitude/Demeanor/Rapport: Engaged, Self-Confident Affect (typically observed): Pleasant Orientation: : Oriented to Self, Oriented to Place, Oriented to  Time, Oriented to Situation Alcohol / Substance Use: Not Applicable Psych Involvement: No (comment)  Admission diagnosis:  Hyperglycemia [R73.9] Abdominal pain [R10.9] Acute gastritis, presence of bleeding unspecified, unspecified gastritis type [K29.00] Non-intractable vomiting with nausea, unspecified vomiting type [R11.2] Patient Active Problem List   Diagnosis Date Noted  . Non-intractable vomiting   . Hematemesis with nausea   . Abdominal pain 08/03/2019  . Essential hypertension 08/03/2019  . Pancreatic pseudocyst   . Nausea and vomiting in adult   . Necrotizing pancreatitis   . Gastritis and gastroduodenitis   . Candida esophagitis ([place])   . Acute pancreatitis 07/17/2019  . Schizophrenia ([place]) 09/02/2018  .  Obesity, Class III, BMI 40-49.9 (morbid obesity) ([place]) 09/02/2018  . Iron deficiency 09/02/2018  . Symptomatic anemia 09/01/2018  . Hypokalemia 09/01/2018  . Severe sepsis ([place]) 08/11/2018  . AKI (acute kidney injury) ([place])  08/11/2018  . Hypotension 08/11/2018  . Type 2 diabetes mellitus ([place]) 08/11/2018  . Acute appendicitis 05/22/2014  . Ureteral calculus, right 05/02/2013   PCP:  Kristie Cowman, MD Pharmacy:   [id number] [place], [id number]: [phone]: [phone]    Social Determinants of Health ([place] Interventions    Readmission Risk Interventions Readmission Risk Prevention Plan 08/20/2019 09/04/2018  Transportation Screening Complete Complete  PCP or Specialist Appt within 5-7 Days - Complete  Home Care Screening - Complete  Medication Review (RN CM) - Complete  Medication Review ([place] Complete -  PCP or Specialist appointment within 3-5 days of discharge Complete -  SW Recovery Care/Counseling Consult Complete -  PBeckleyNot Applicable -  Some recent data might be hidden

## 2019-08-21 ENCOUNTER — Inpatient Hospital Stay (HOSPITAL_COMMUNITY): Payer: 59 | Admitting: Certified Registered Nurse Anesthetist

## 2019-08-21 ENCOUNTER — Inpatient Hospital Stay (HOSPITAL_COMMUNITY): Payer: 59

## 2019-08-21 ENCOUNTER — Encounter (HOSPITAL_COMMUNITY): Payer: Self-pay | Admitting: Family Medicine

## 2019-08-21 ENCOUNTER — Encounter (HOSPITAL_COMMUNITY)
Admission: EM | Disposition: A | Discharge status: Home / Self Care | Payer: Self-pay | Source: Home / Self Care | Attending: Internal Medicine

## 2019-08-21 HISTORY — PX: UPPER ESOPHAGEAL ENDOSCOPIC ULTRASOUND (EUS): SHX6562

## 2019-08-21 HISTORY — PX: ESOPHAGOGASTRODUODENOSCOPY (EGD) WITH PROPOFOL: SHX5813

## 2019-08-21 LAB — GLUCOSE, CAPILLARY
Glucose-Capillary: 257 mg/dL — ABNORMAL HIGH (ref 70–99)
Glucose-Capillary: 181 mg/dL — ABNORMAL HIGH (ref 70–99)
Glucose-Capillary: 173 mg/dL — ABNORMAL HIGH (ref 70–99)
Glucose-Capillary: 162 mg/dL — ABNORMAL HIGH (ref 70–99)
Glucose-Capillary: 188 mg/dL — ABNORMAL HIGH (ref 70–99)
Glucose-Capillary: 298 mg/dL — ABNORMAL HIGH (ref 70–99)

## 2019-08-21 LAB — TYPE AND SCREEN
ABO/RH(D): A POS
Antibody Screen: NEGATIVE
Unit division: 0
Unit division: 0

## 2019-08-21 LAB — CBC
HCT: 33.5 % — ABNORMAL LOW (ref 36.0–46.0)
Hemoglobin: 9.7 g/dL — ABNORMAL LOW (ref 12.0–15.0)
MCH: 22.5 pg — ABNORMAL LOW (ref 26.0–34.0)
MCHC: 29 g/dL — ABNORMAL LOW (ref 30.0–36.0)
MCV: 77.5 fL — ABNORMAL LOW (ref 80.0–100.0)
Platelets: 216 10*3/uL (ref 150–400)
RBC: 4.32 MIL/uL (ref 3.87–5.11)
RDW: 15.1 % (ref 11.5–15.5)
WBC: 5.2 10*3/uL (ref 4.0–10.5)
nRBC: 0 % (ref 0.0–0.2)

## 2019-08-21 LAB — BASIC METABOLIC PANEL
Anion gap: 9 (ref 5–15)
BUN: 8 mg/dL (ref 6–20)
CO2: 24 mmol/L (ref 22–32)
Calcium: 8.7 mg/dL — ABNORMAL LOW (ref 8.9–10.3)
Chloride: 107 mmol/L (ref 98–111)
Creatinine, Ser: 0.71 mg/dL (ref 0.44–1.00)
GFR calc Af Amer: >60 mL/min (ref >60)
GFR calc non Af Amer: >60 mL/min (ref >60)
Glucose, Bld: 284 mg/dL — ABNORMAL HIGH (ref 70–99)
Potassium: 3.5 mmol/L (ref 3.5–5.1)
Sodium: 140 mmol/L (ref 135–145)

## 2019-08-21 LAB — BPAM RBC
Blood Product Expiration Date: 202104172359
Blood Product Expiration Date: 202104172359
Unit Type and Rh: 6200
Unit Type and Rh: 6200

## 2019-08-21 IMAGING — CT CT ANGIO ABDOMEN
2 of 12 series · 8 of 46 positions shown, 14 images · IV contrast (omnipaque)
Comparison: [DATE]

CLINICAL DATA: Pancreatic pseudocyst with concern for arterial flow
seen on recent EUS. Concern for pseudoaneurysm.

EXAM:
CT ANGIOGRAPHY OF ABDOMINAL AORTA
TECHNIQUE: Multidetector CT imaging of the abdomen was performed using the
standard protocol during bolus administration of intravenous
contrast.
CONTRAST:  100mL OMNIPAQUE IOHEXOL 350 MG/ML SOLN

[Series 6: coronal arterial · coronal · arterial · 0.55mm/px · 3 of 119 slices shown, 4 images]
[im 30/119  soft-tissue]
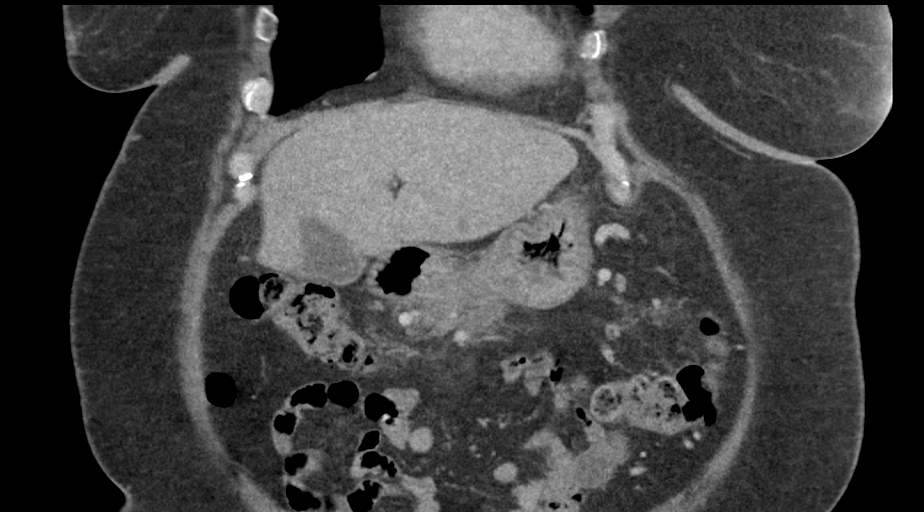
[im 60/119  soft-tissue]
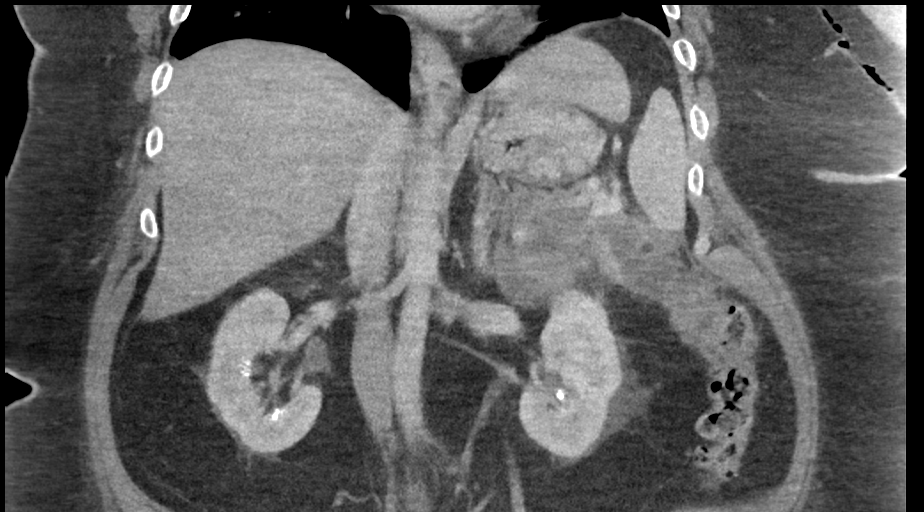
[im 60/119  bone]
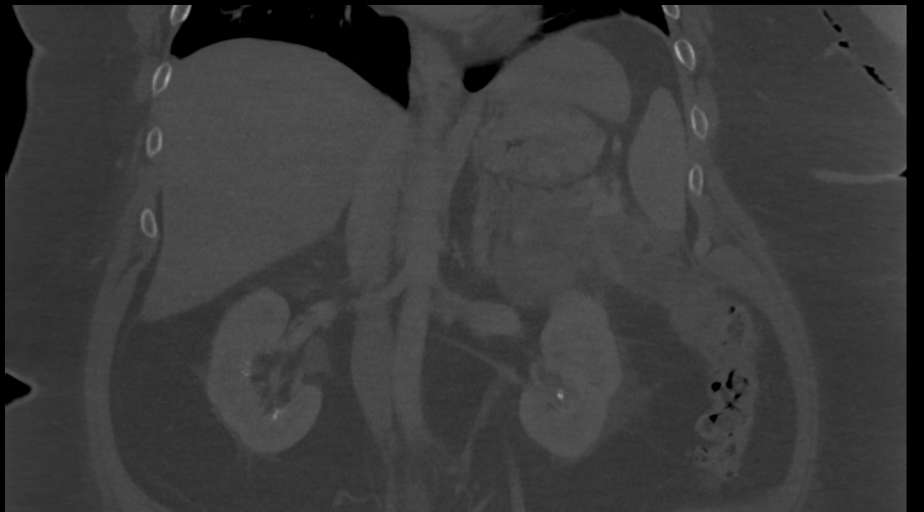
[im 89/119  soft-tissue]
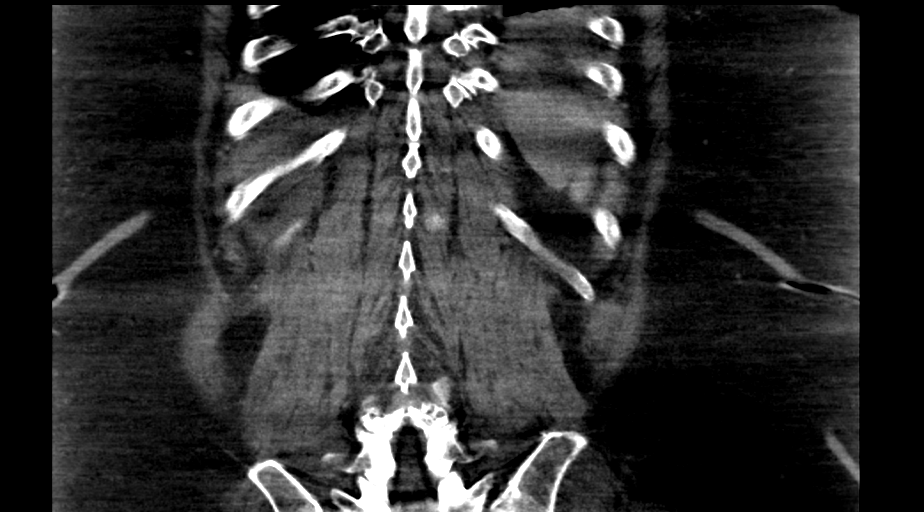

[Series 10: axial venous · axial · portal-venous · 0.95mm/px · z∈[+1315,+1495]mm · 5 of 92 slices shown, 10 images]
[im 16/92  soft-tissue]
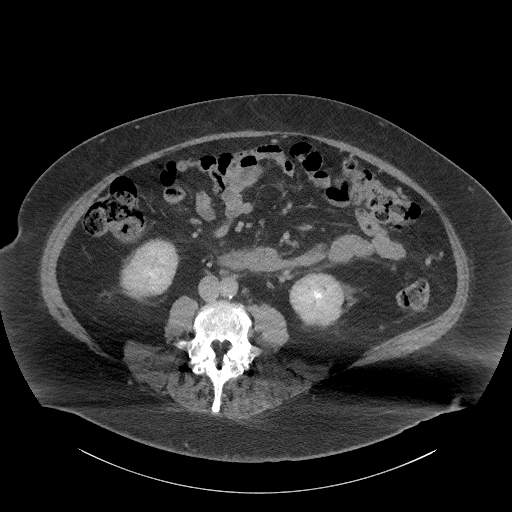
[im 16/92  bone]
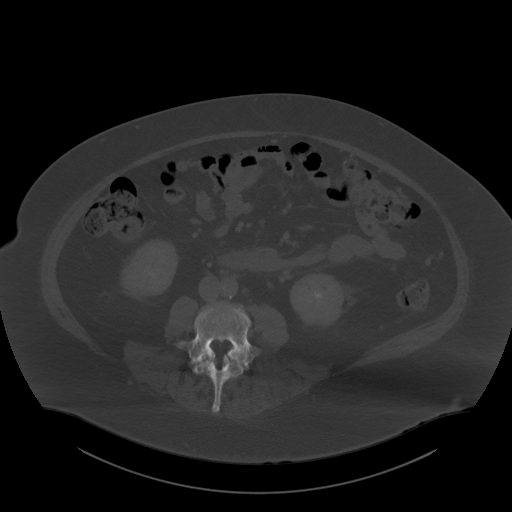
[im 31/92  soft-tissue]
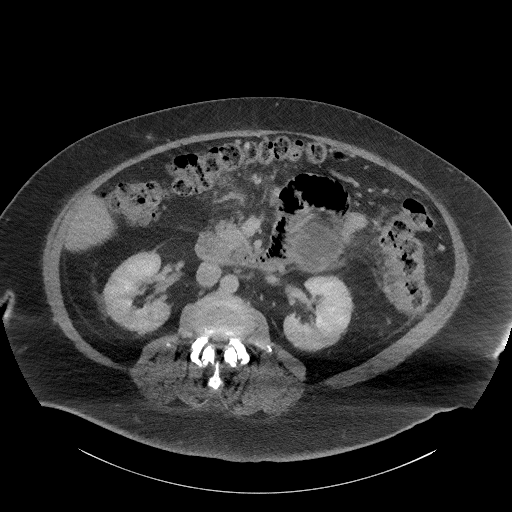
[im 31/92  lung]
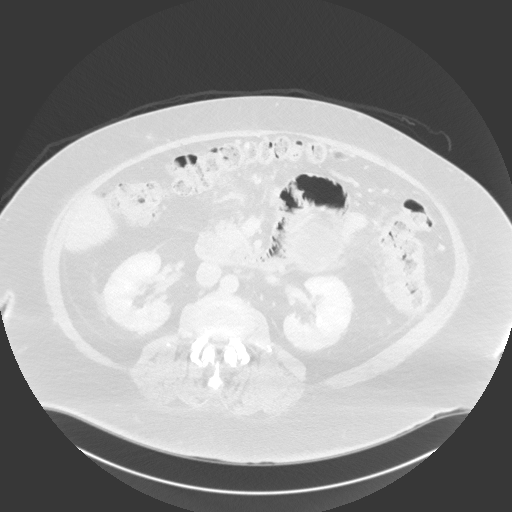
[im 46/92  soft-tissue]
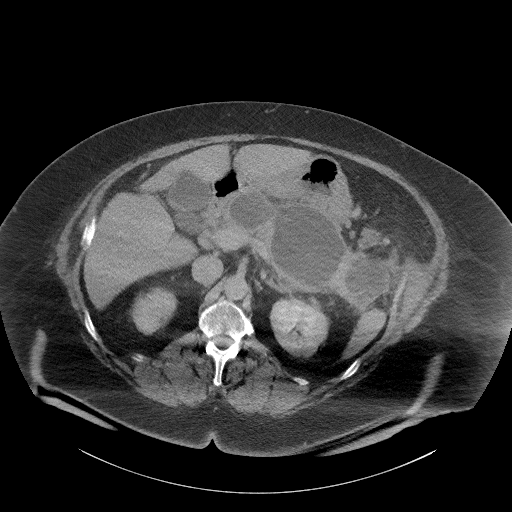
[im 46/92  lung]
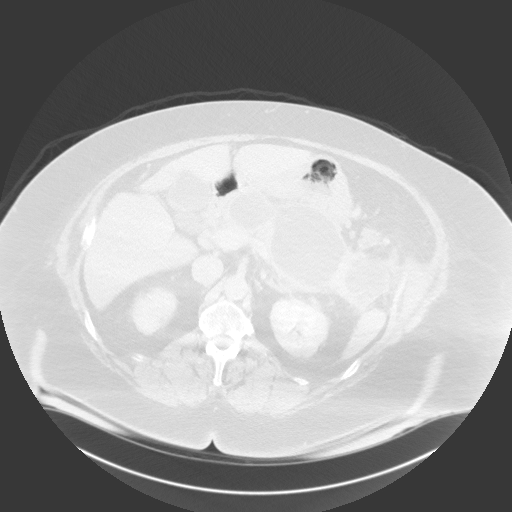
[im 61/92  soft-tissue]
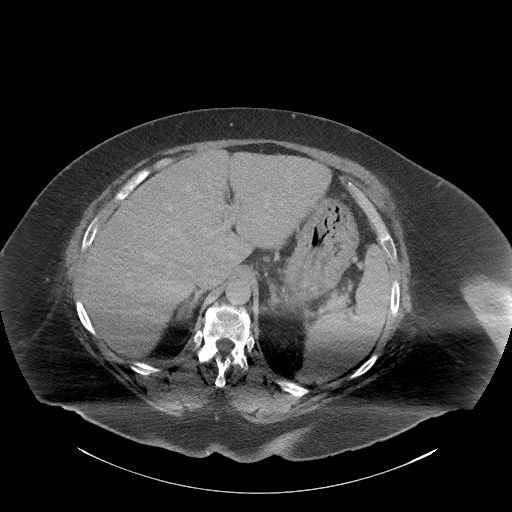
[im 61/92  lung]
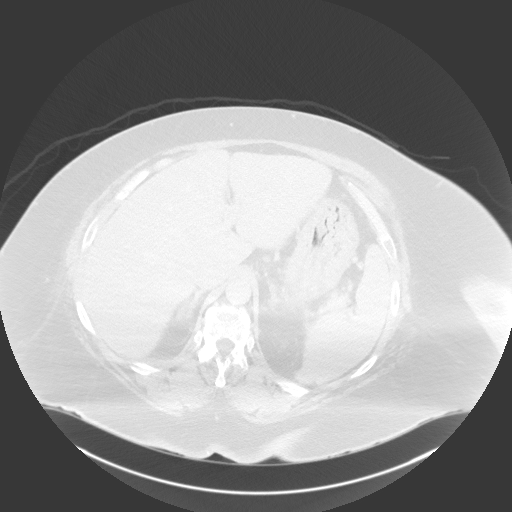
[im 76/92  soft-tissue]
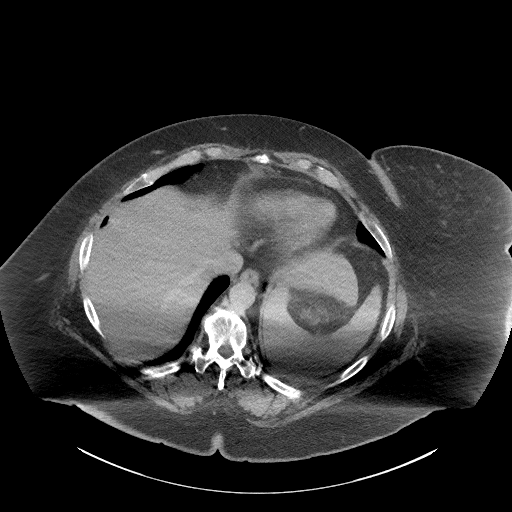
[im 76/92  lung]
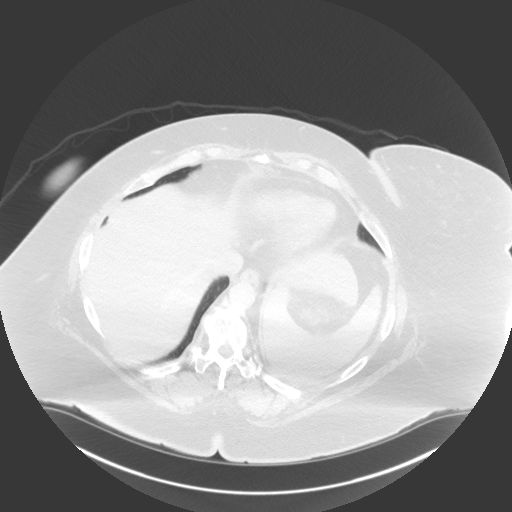

[8 of 46 positions shown; findings below may reference images not displayed]

FINDINGS: VASCULAR

Evaluation of the arterial vasculature it was severely limited by
suboptimal contrast bolus timing.

Aorta: Normal caliber aorta without aneurysm, dissection, vasculitis
or significant stenosis.

Celiac: Patent without evidence of aneurysm, dissection, vasculitis
or significant stenosis.

SMA: Patent without evidence of aneurysm, dissection, vasculitis or
significant stenosis.

Renals: Both renal arteries are patent without evidence of aneurysm,
dissection, vasculitis, fibromuscular dysplasia or significant
stenosis.

IMA: Patent without evidence of aneurysm, dissection, vasculitis or
significant stenosis.

Veins: The portal vein is patent. The SMV is patent. The splenic
vein is significantly attenuated at the level of the pancreatic body
and dominant pancreatic fluid collection (axial series 5, image 49).
The distal pancreatic vein is difficult to follow and is felt to be
occluded given the presence of portal systemic collaterals coursing
through the patient's anterior abdomen. There is no definite CT
evidence for an aneurysm or pseudoaneurysm.

NON-VASCULAR

Lower chest: The lung bases are clear. The heart size is normal.

Hepatobiliary: There is decreased hepatic attenuation suggestive of
hepatic steatosis. Normal gallbladder.There is no biliary ductal
dilation.

Pancreas: Again noted are multiple peripancreatic in intrahepatic
fluid collections/pseudocyst. Currently, the dominant collection
measures approximately 12.1 by 7 cm (previously measuring 12.3 x
cm). The pancreatic tail collection measures approximately 4.4 by
5.4 cm (previously measuring 5.9 x 5.2 cm. Peripancreatic
inflammatory changes are again noted. Pancreatic calcifications are
again noted in the pancreatic head.

Spleen: The spleen is enlarged measuring approximately 15.4 cm
craniocaudad. This is stable in size from [DATE].

Adrenals/Urinary Tract:

--Adrenal glands: No adrenal hemorrhage.

--Right kidney/ureter: Nonobstructing right-sided nephrolithiasis is
noted.

--Left kidney/ureter: Nonobstructing left-sided nephrolithiasis is
noted.

--Urinary bladder: Outside the field of view.

Stomach/Bowel:

--Stomach/Duodenum: There is some wall thickening of the gastric
body and fundus which is likely reactive from the nearby pancreatic
collections.

--Small bowel: The distal duodenum is mildly dilated without
evidence for high-grade ileus or small bowel obstruction.

--Colon: There is some wall thickening of the splenic flexure of the
colon which is likely reactive. The remaining portions of the colon
are unremarkable.

--Appendix: Outside the field of view.

Lymphatic:

--No retroperitoneal lymphadenopathy.

--No mesenteric lymphadenopathy.

Other: No ascites or free air. The abdominal wall is normal.

Musculoskeletal. There is a bilateral pars defect at the presumed
L4-L5 level resulting in grade 1-2 anterolisthesis. There is no
definite evidence for an acute displaced fracture.
IMPRESSION: 1. Evaluation of the arterial structures is significantly limited by
suboptimal contrast bolus timing in the patient's habitus. Given
these limitations, no pseudoaneurysm was detected.
2. Probable occlusion of the splenic vein. Portosystemic collaterals
are noted.
3. The portal vein and SMV remain patent. The splenic artery is
patent.
4. Stable pancreatic fluid collections as previously described.
5. Bilateral nonobstructing nephrolithiasis.
6. Wall thickening of the splenic flexure of the colon, likely
reactive. There is mild dilatation of the distal duodenum and
proximal jejunum likely representing a low-grade ileus.
7. Hepatic steatosis.

## 2019-08-21 SURGERY — UPPER ESOPHAGEAL ENDOSCOPIC ULTRASOUND (EUS)
Anesthesia: Monitor Anesthesia Care

## 2019-08-21 MED ORDER — LACTATED RINGERS IV SOLN: INTRAVENOUS | Status: DC

## 2019-08-21 MED ORDER — CIPROFLOXACIN IN D5W 400 MG/200ML IV SOLN
INTRAVENOUS | Status: AC
  Filled 2019-08-21: qty 200

## 2019-08-21 MED ORDER — IOHEXOL 350 MG/ML SOLN
100.0000 mL | Freq: Once | INTRAVENOUS | Status: AC | PRN
  Administered 2019-08-21: 100 mL via INTRAVENOUS

## 2019-08-21 MED ORDER — PROPOFOL 10 MG/ML IV BOLUS
INTRAVENOUS | Status: DC | PRN
  Administered 2019-08-21: 200 mg via INTRAVENOUS

## 2019-08-21 MED ORDER — EPINEPHRINE 1 MG/10ML IJ SOSY
PREFILLED_SYRINGE | INTRAMUSCULAR | Status: AC
  Filled 2019-08-21: qty 10

## 2019-08-21 MED ORDER — FENTANYL CITRATE (PF) 100 MCG/2ML IJ SOLN
INTRAMUSCULAR | Status: DC | PRN
  Administered 2019-08-21 (×3): 50 ug via INTRAVENOUS

## 2019-08-21 MED ORDER — LIDOCAINE 2% (20 MG/ML) 5 ML SYRINGE
INTRAMUSCULAR | Status: DC | PRN
  Administered 2019-08-21: 100 mg via INTRAVENOUS

## 2019-08-21 MED ORDER — FENTANYL CITRATE (PF) 100 MCG/2ML IJ SOLN
INTRAMUSCULAR | Status: AC
  Filled 2019-08-21: qty 2

## 2019-08-21 MED ORDER — PROPOFOL 500 MG/50ML IV EMUL
INTRAVENOUS | Status: AC
  Filled 2019-08-21: qty 50

## 2019-08-21 MED ORDER — SUGAMMADEX SODIUM 200 MG/2ML IV SOLN
INTRAVENOUS | Status: DC | PRN
  Administered 2019-08-21: 500 mg via INTRAVENOUS

## 2019-08-21 MED ORDER — GLUCAGON HCL RDNA (DIAGNOSTIC) 1 MG IJ SOLR
INTRAMUSCULAR | Status: AC
  Filled 2019-08-21: qty 1

## 2019-08-21 MED ORDER — ROCURONIUM BROMIDE 50 MG/5ML IV SOSY
PREFILLED_SYRINGE | INTRAVENOUS | Status: DC | PRN
  Administered 2019-08-21: 70 mg via INTRAVENOUS

## 2019-08-21 MED ORDER — ONDANSETRON HCL 4 MG/2ML IJ SOLN
INTRAMUSCULAR | Status: DC | PRN
  Administered 2019-08-21: 4 mg via INTRAVENOUS

## 2019-08-21 NOTE — Anesthesia Preprocedure Evaluation (Addendum)
Anesthesia Evaluation  Patient identified by MRN, date of birth, ID band Patient awake    Reviewed: Allergy & Precautions, NPO status , Patient's Chart, lab work & pertinent test results, reviewed documented beta blocker date and time   Airway Mallampati: I  TM Distance: >3 FB Neck ROM: Full    Dental no notable dental hx. (+) Teeth Intact   Pulmonary asthma , sleep apnea ,    Pulmonary exam normal        Cardiovascular hypertension, Pt. on medications and Pt. on home beta blockers Normal cardiovascular exam     Neuro/Psych PSYCHIATRIC DISORDERS Anxiety Depression Schizophrenia negative neurological ROS     GI/Hepatic GERD  Medicated and Controlled,  Endo/Other  diabetes, Type 2, Insulin Dependent, Oral Hypoglycemic AgentsMorbid obesity  Renal/GU   negative genitourinary   Musculoskeletal   Abdominal (+) + obese,   Peds  Hematology  (+) Blood dyscrasia, anemia ,   Anesthesia Other Findings   Reproductive/Obstetrics                             Anesthesia Physical  Anesthesia Plan  ASA: III  Anesthesia Plan: General   Post-op Pain Management:    Induction: Intravenous  PONV Risk Score and Plan: 2 and Treatment may vary due to age or medical condition  Airway Management Planned: Oral ETT  Additional Equipment: None  Intra-op Plan:   Post-operative Plan: Extubation in OR  Informed Consent: I have reviewed the patients History and Physical, chart, labs and discussed the procedure including the risks, benefits and alternatives for the proposed anesthesia with the patient or authorized representative who has indicated his/her understanding and acceptance.       Plan Discussed with: CRNA, Anesthesiologist and Surgeon  Anesthesia Plan Comments: (  )       Anesthesia Quick Evaluation

## 2019-08-21 NOTE — Transfer of Care (Signed)
Immediate Anesthesia Transfer of Care Note  Patient: Monique Holland  Procedure(s) Performed: UPPER ESOPHAGEAL ENDOSCOPIC ULTRASOUND (EUS) (N/A ) ESOPHAGOGASTRODUODENOSCOPY (EGD) WITH PROPOFOL (N/A )  Patient Location: Endoscopy Unit  Anesthesia Type:General  Level of Consciousness: drowsy and patient cooperative  Airway & Oxygen Therapy: Patient Spontanous Breathing and Patient connected to face mask oxygen  Post-op Assessment: Report given to RN and Post -op Vital signs reviewed and stable  Post vital signs: Reviewed and stable  Last Vitals:  Vitals Value Taken Time  BP 174/111 08/21/19 1345  Temp    Pulse 100 08/21/19 1347  Resp 16 08/21/19 1347  SpO2 100 % 08/21/19 1347  Vitals shown include unvalidated device data.  Last Pain:  Vitals:   08/21/19 1154  TempSrc: Oral  PainSc: 0-No pain      Patients Stated Pain Goal: 0 (08/18/19 2300)  Complications: No apparent anesthesia complications

## 2019-08-21 NOTE — Op Note (Signed)
[facility] Patient Name: Monique Holland Procedure Date: 08/21/2019 MRN: [id number] Attending MD: Justice Britain , MD Date of Birth: [birth year] CSN: [id number] Age: 54 Admit Type: Inpatient Procedure:                Upper EUS Indications:              History of Acute pancreatitis, Pancreatic cyst,                            Pancreatic necrosis, Pancreatic pseudocyst Providers:                Justice Britain, MD, Benetta Spar RN, RN, Carlyn Reichert, RN, Lazaro Arms, Technician Referring MD:             Inpatient GI Team, [facility] Medicines:                General Anesthesia Complications:            No immediate complications. Estimated Blood Loss:     Estimated blood loss: none. Procedure:                Pre-Anesthesia Assessment:                           - Prior to the procedure, a History and Physical                            was performed, and patient medications and                            allergies were reviewed. The patient's tolerance of                            previous anesthesia was also reviewed. The risks                            and benefits of the procedure and the sedation                            options and risks were discussed with the patient.                            All questions were answered, and informed consent                            was obtained. Prior Anticoagulants: The patient has                            taken no previous anticoagulant or antiplatelet                            agents. ASA Grade Assessment: III - A patient with  severe systemic disease. After reviewing the risks                            and benefits, the patient was deemed in                            satisfactory condition to undergo the procedure.                           After obtaining informed consent, the endoscope was                            passed under direct vision.  Throughout the                            procedure, the patient's blood pressure, pulse, and                            oxygen saturations were monitored continuously. The                            [id number] ([id number]) Olympus therapeutic endoscope                            was introduced through the mouth, and advanced to                            the second part of duodenum. The [id number]                            ([id number]) Olympus was introduced through the mouth,                            and advanced to the second part of duodenum. The                            [id number] ([id number]) Olympus Linear EUS was                            introduced through the mouth, and advanced to the                            duodenum for ultrasound examination from the                            stomach and duodenum. The upper EUS was                            accomplished without difficulty. The patient                            tolerated the procedure. Scope In: Scope Out: Findings:      ENDOSCOPIC FINDING: :      No gross lesions were noted in  the entire esophagus.      A mild extrinsic deformity was found on the posterior wall of the       stomach likely from pseudocyst/walled off necrosis.      Patchy mildly erythematous mucosa without bleeding was found in the       gastric body and in the gastric antrum.      No other gross lesions were noted in the entire examined stomach.      No gross lesions were noted in the duodenal bulb, in the first portion       of the duodenum, in the second portion of the duodenum and in the major       papilla.      ENDOSONOGRAPHIC FINDING: :      An anechoic and heterogenous, septated and shadowing lesion suggestive       of a pseudocyst with walled off necrosis was identified in the       peripancreatic region. The lesion measured 85 mm by 84 mm in maximal       cross-sectional diameter. There was a single compartment The outer wall       of the lesion  was thick. There was no associated mass. There was       internal debris within the fluid-filled cavity consistent with necrosis.       There seemed to be a blood vessel interposed into the cyst wall with       component within the cyst itself. This revealed to be arterial       pulsatility on doppler imaging. Did not move forward with attempt at       Pseudocystgastrostomy today due to concern for pulsatile arterial flow       moving into the cyst wall cavity - query pseudoaneurysm (though none       seen on last CT) vs arterial flow coursing behind the walled off       necrosis.      Endosonographic imaging in the visualized portion of the liver showed no       mass.      One enlarged lymph node was visualized in the porta hepatis region. It       measured 14 mm by 7 mm in maximal cross-sectional diameter. The node was       triangular, isoechoic and had well defined margins. Inflammatory in       nature.      The celiac region was visualized. Impression:               EGD Impression:                           - No gross lesions in esophagus.                           - Acquired extrinsic deformity in the posterior                            wall of the stomach.                           - Erythematous mucosa in the gastric body and  antrum.                           - No gross lesions in the duodenal bulb, in the                            first portion of the duodenum, in the second                            portion of the duodenum and in the major papilla.                           EUS Impression:                           - A 85 mm by 84 mm cyst with walled off necrosis                            was seen in the peripancreatic region. Tissue has                            not been obtained. However, the endosonographic                            appearance is consistent with a pancreatic                            pseudocyst with necrosis.                            - One enlarged lymph node was visualized in the                            porta hepatis region. Tissue has not been obtained.                            However, the endosonographic appearance is                            consistent with benign inflammatory changes. Moderate Sedation:      Not Applicable - Patient had care per Anesthesia. Recommendation:           - The patient will be observed post-procedure,                            until all discharge criteria are met.                           - Return patient to hospital ward for ongoing care.                           - Observe patient's clinical course.                           - Recommend  proceeding with a CT-Angiography                            Abdomen (Arterial/Venous phase) to evaluate for                            potential pseudoaneurysm or arterial flow into the                            cyst as concerning on today's evaluation.                           - OK for liquid diet. After CT-A has been complete                            we can consider repeat attempt.                           - The findings and recommendations were discussed                            with the patient.                           - The findings and recommendations were discussed                            with the patient's family.                           - The findings and recommendations were discussed                            with the referring physician. Procedure Code(s):        --- Professional ---                           [phone], Esophagogastroduodenoscopy, flexible,                            transoral; with endoscopic ultrasound examination                            limited to the esophagus, stomach or duodenum, and                            adjacent structures Diagnosis Code(s):        --- Professional ---                           K31.89, Other diseases of stomach and duodenum                           K86.3,  Pseudocyst of pancreas  R59.0, Localized enlarged lymph nodes                           K85.90, Acute pancreatitis without necrosis or                            infection, unspecified                           K86.2, Cyst of pancreas                           K86.89, Other specified diseases of pancreas CPT copyright 2019 American Medical Association. All rights reserved. The codes documented in this report are preliminary and upon coder review may  be revised to meet current compliance requirements. Justice Britain, MD 08/21/2019 1:45:35 PM Number of Addenda: 0

## 2019-08-21 NOTE — Anesthesia Procedure Notes (Signed)
Procedure Name: Intubation Date/Time: 08/21/2019 12:52 PM Performed by: Montel Clock, CRNA Pre-anesthesia Checklist: Patient identified, Emergency Drugs available, Suction available, Patient being monitored and Timeout performed Patient Re-evaluated:Patient Re-evaluated prior to induction Oxygen Delivery Method: Circle system utilized Preoxygenation: Pre-oxygenation with 100% oxygen Induction Type: IV induction Ventilation: Mask ventilation without difficulty and Oral airway inserted - appropriate to patient size Laryngoscope Size: Mac and 3 Grade View: Grade II Tube type: Oral Tube size: 7.0 mm Number of attempts: 1 Airway Equipment and Method: Stylet Placement Confirmation: ETT inserted through vocal cords under direct vision,  positive ETCO2 and breath sounds checked- equal and bilateral Secured at: 21 cm Tube secured with: Tape Dental Injury: Teeth and Oropharynx as per pre-operative assessment

## 2019-08-21 NOTE — Anesthesia Postprocedure Evaluation (Signed)
Anesthesia Post Note  Patient: Monique Holland  Procedure(s) Performed: UPPER ESOPHAGEAL ENDOSCOPIC ULTRASOUND (EUS) (N/A ) ESOPHAGOGASTRODUODENOSCOPY (EGD) WITH PROPOFOL (N/A )     Patient location during evaluation: PACU Anesthesia Type: MAC Level of consciousness: awake and alert Pain management: pain level controlled Vital Signs Assessment: post-procedure vital signs reviewed and stable Respiratory status: spontaneous breathing, nonlabored ventilation, respiratory function stable and patient connected to nasal cannula oxygen Cardiovascular status: stable and blood pressure returned to baseline Postop Assessment: no apparent nausea or vomiting Anesthetic complications: no    Last Vitals:  Vitals:   08/21/19 1410 08/21/19 1420  BP: (!) 166/102 (!) 157/94  Pulse: 99 97  Resp: (!) 21 18  Temp:  36.9 C  SpO2: 98% 96%    Last Pain:  Vitals:   08/21/19 1420  TempSrc: Oral  PainSc: 0-No pain                 Tesneem Dufrane

## 2019-08-21 NOTE — Progress Notes (Signed)
PROGRESS NOTE    Monique Holland    Code Status: Full Code  IRS:[id number] DOB: Mar 03, 1966 DOA: 08/16/2019 LOS: 4 days  PCP: Knox Royalty, MD CC:  Chief Complaint  Patient presents with  . Nausea  . Emesis       Hospital Summary   54 year old with past medical history significant for ID DM2, hypertension, gastritis, chronic pancreatitis with pseudocyst, morbid obesity who presents complaining of abdominal pain and vomiting a small amount of blood.  -Patient last hospitalization from 08/02/2019 130 07/1618 21 for epigastric abdominal pain nausea vomiting thought to be secondary to gastritis esophagitis.  Renal admission she had an MRCP which showed 12.6 cm pseudocyst, unchanged from recent CT scan. 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  Patient admitted with recurrent vomiting.  Underwent endoscopy which was normal except for Mild to moderate extrinsic compression in the region of duodenal bulb from Pancreatic pseudocyst.    3/31: EGD/EUS with Dr. Meridee Score -acquired extrinsic deformity and posterior wall of stomach, erythematous mucosa of gastric body and antrum.  85 x 84 mm cyst walled off necrosis in peripancreatic region consistent with pancreatic pseudocyst and necrosis with 1 enlarged lymph node.  Further imaging ordered by GI for further evaluation to evaluate for potential pseudoaneurysm  A & P   Active Problems:   AKI (acute kidney injury) (HCC)   Type 2 diabetes mellitus (HCC)   Schizophrenia (HCC)   Obesity, Class III, BMI 40-49.9 (morbid obesity) (HCC)   Acute pancreatitis   Pancreatic pseudocyst   Abdominal pain   Essential hypertension   Non-intractable vomiting   Hematemesis with nausea   1. Acute on chronic abdominal pain, chronic  pancreatitis, pancreatic pseudocyst, persistent vomiting a. S/p EGD/EUS 3/31:acquired extrinsic deformity and posterior wall of stomach, erythematous mucosa of gastric body and antrum.  85 x 84 mm cyst walled off necrosis in peripancreatic region consistent with pancreatic pseudocyst and necrosis with 1 enlarged lymph node.   i. CTA abdomen ordered by GI for further evaluation to evaluate for potential pseudoaneurysm b. Appreciate further GI recommendations  2. Hematemesis a. No recurrence b. EGD findings as above c. Appreciate further GI recommendations  3. Diarrhea, resolved  4. Hypokalemia, resolved  5. AKI, resolved  6. Type 2 diabetes a. Continue Lantus 10 units daily, NovoLog 2 units 3 times daily with meals and sliding scale  7. Asthma, stable on room air  8. Hypertension a. Continue Lopressor and amlodipine  9. Anxiety/depression/ Schizophrenia BPD a. Continue trazodone, amitriptyline, Latuda  DVT prophylaxis: SCDs Family Communication: Patient stated at bedside Disposition Plan:   Patient came from:   Home                                                                                          Anticipated d/c place: Home  Barriers to d/c: Pending further GI recommendations  Pressure injury documentation    None  Consultants  GI  Procedures  3/31: EGD/EUS  Antibiotics  Anti-infectives (From admission, onward)   None        Subjective   Seen and examined at bedside no acute distress resting comfortably post gastro procedure.  Denies any complaints at this time tolerated procedure well.  Has appetite.  Objective   Vitals:   08/21/19 1350 08/21/19 1400 08/21/19 1410 08/21/19 1420  BP: (!) 168/97 (!) 180/103 (!) 166/102 (!) 157/94  Pulse: 99 99 99 97  Resp: 19 11 (!) 21 18  Temp:    98.5 F (36.9 C)  TempSrc:    Oral  SpO2: 100% 100% 98% 96%  Weight:      Height:        Intake/Output Summary (Last 24 hours) at 08/21/2019 1700 Last data  filed at 08/21/2019 1335 Gross per 24 hour  Intake 2910.56 ml  Output 0 ml  Net 2910.56 ml   Filed Weights   08/17/19 0529 08/18/19 1059 08/21/19 1154  Weight: (!) 138.4 kg 135.6 kg 135.6 kg    Examination:  Physical Exam Vitals and nursing note reviewed.  Constitutional:      Appearance: She is obese. She is not ill-appearing.  HENT:     Head: Normocephalic and atraumatic.  Eyes:     Conjunctiva/sclera: Conjunctivae normal.  Cardiovascular:     Rate and Rhythm: Normal rate and regular rhythm.  Pulmonary:     Effort: Pulmonary effort is normal.     Breath sounds: Normal breath sounds.  Abdominal:     General: Abdomen is flat.     Palpations: Abdomen is soft.     Tenderness: There is abdominal tenderness.  Musculoskeletal:        General: No swelling or tenderness.  Skin:    Coloration: Skin is not jaundiced or pale.  Neurological:     Mental Status: She is alert. Mental status is at baseline.  Psychiatric:        Mood and Affect: Mood normal.        Behavior: Behavior normal.     Data Reviewed: I have personally reviewed following labs and imaging studies  CBC: Recent Labs  Lab 08/16/19 1920 08/17/19 0522 08/17/19 2024 08/18/19 0453 08/18/19 1230 08/20/19 0031 08/21/19 0436  WBC 7.9   < > 6.7 5.5 5.7 4.7 5.2  NEUTROABS 4.1  --   --   --   --   --   --   HGB 10.5*   < > 9.6* 9.9* 10.4* 10.4* 9.7*  HCT 36.0   < > 33.3* 33.9* 36.4 36.3 33.5*  MCV 78.9*   < > 78.5* 77.0* 77.6* 78.2* 77.5*  PLT 297   < > 207 183 249 243 216   < > = values in this interval not displayed.   Basic Metabolic Panel: Recent Labs  Lab 08/16/19 1920 08/17/19 0522 08/18/19 0453 08/20/19 0031 08/20/19 0823 08/21/19 0436  NA 134* 137 134*  --  137 140  K 3.6 3.3* 3.7  --  3.6 3.5  CL 98 104 102  --  104 107  CO2 24 24 21*  --  21* 24  GLUCOSE 403* 250* 227*  --  301* 284*  BUN 11 12 6   --  7 8  CREATININE 1.10* 0.92 0.77  --  0.77 0.71  CALCIUM 9.3 8.7* 8.4*  --  8.8* 8.7*   MG  --   --   --  1.7  --   --   PHOS  --   --   --  3.6  --   --    GFR: Estimated Creatinine Clearance: 120.3 mL/min (by C-G formula based on SCr of 0.71 mg/dL). Liver Function Tests: Recent Labs  Lab 08/16/19 1920 08/17/19 0522 08/20/19 0031  AST 34 28 27  ALT 25 23 18   ALKPHOS 77 71 64  BILITOT 0.7 0.6 0.3  PROT 7.0 6.4* 6.9  ALBUMIN 3.7 3.4* 3.6   Recent Labs  Lab 08/16/19 1920  LIPASE 35   No results for input(s): AMMONIA in the last 168 hours. Coagulation Profile: Recent Labs  Lab 08/20/19 0031  INR 0.9   Cardiac Enzymes: No results for input(s): CKTOTAL, CKMB, CKMBINDEX, TROPONINI in the last 168 hours. BNP (last 3 results) No results for input(s): PROBNP in the last 8760 hours. HbA1C: No results for input(s): HGBA1C in the last 72 hours. CBG: Recent Labs  Lab 08/21/19 0725 08/21/19 1122 08/21/19 1219 08/21/19 1410 08/21/19 1644  GLUCAP 257* 181* 173* 162* 188*   Lipid Profile: No results for input(s): CHOL, HDL, LDLCALC, TRIG, CHOLHDL, LDLDIRECT in the last 72 hours. Thyroid Function Tests: No results for input(s): TSH, T4TOTAL, FREET4, T3FREE, THYROIDAB in the last 72 hours. Anemia Panel: No results for input(s): VITAMINB12, FOLATE, FERRITIN, TIBC, IRON, RETICCTPCT in the last 72 hours. Sepsis Labs: No results for input(s): PROCALCITON, LATICACIDVEN in the last 168 hours.  Recent Results (from the past 240 hour(s))  SARS CORONAVIRUS 2 (TAT 6-24 HRS) Nasopharyngeal Nasopharyngeal Swab     Status: None   Collection Time: 08/17/19  2:10 AM   Specimen: Nasopharyngeal Swab  Result Value Ref Range Status   SARS Coronavirus 2 NEGATIVE NEGATIVE Final    Comment: (NOTE) SARS-CoV-2 target nucleic acids are NOT DETECTED. The SARS-CoV-2 RNA is generally detectable in upper and lower respiratory specimens during the acute phase of infection. Negative results do not preclude SARS-CoV-2 infection, do not rule out co-infections with other pathogens, and  should not be used as the sole basis for treatment or other patient management decisions. Negative results must be combined with clinical observations, patient history, and epidemiological information. The expected result is Negative. Fact Sheet for Patients: SugarRoll.be Fact Sheet for Healthcare Providers: https://www.woods-mathews.com/ This test is not yet approved or cleared by the [place] FDA and  has been authorized for detection and/or diagnosis of SARS-CoV-2 by FDA under an Emergency Use Authorization (EUA). This EUA will remain  in effect (meaning this test can be used) for the duration of the COVID-19 declaration under Section 56 4(b)(1) of the Act, 21 U.S.C. section 360bbb-3(b)(1), unless the authorization is terminated or revoked sooner. Performed at [facility], [place] [address], [place], [place] [address]          Radiology Studies: No results found.      Scheduled Meds: . amitriptyline  75 mg Oral QHS  . amLODipine  10 mg Oral Daily  . insulin aspart  0-15 Units Subcutaneous TID WC  . insulin aspart  0-5 Units Subcutaneous QHS  . insulin aspart  2 Units Subcutaneous TID WC  . insulin glargine  10 Units Subcutaneous Daily  . lamoTRIgine  200 mg Oral Daily  . lurasidone  60 mg Oral QHS  . metoprolol tartrate  25 mg Oral BID  . pantoprazole  40 mg Oral Q12H  . polyethylene glycol  17 g Oral BID  . traZODone  100 mg Oral QHS   Continuous Infusions: . sodium chloride 100 mL/hr at 08/21/19 0526     Time spent: 28  minutes with over 50% of the time coordinating the patient's care    Harold Hedge, DO [facility] Pager [phone]  Call night coverage person covering after 7pm

## 2019-08-21 NOTE — Progress Notes (Signed)
Call placed to primary RN to verify NPO status prior to endoscopy procedure. She states patient has been NPO since midnight.

## 2019-08-21 NOTE — Plan of Care (Signed)

## 2019-08-21 NOTE — Interval H&P Note (Signed)
History and Physical Interval Note:  08/21/2019 12:25 PM  Monique Holland  has presented today for surgery, with the diagnosis of vomiting, pseudocysts.  The various methods of treatment have been discussed with the patient and family. After consideration of risks, benefits and other options for treatment, the patient has consented to  Procedure(s) with comments: UPPER ESOPHAGEAL ENDOSCOPIC ULTRASOUND (EUS) (N/A) - For cyst gastrostomy as a surgical intervention.  The patient's history has been reviewed, patient examined, no change in status, stable for surgery.  I have reviewed the patient's chart and labs.  Questions were answered to the patient's satisfaction.     [employer]

## 2019-08-22 ENCOUNTER — Encounter: Payer: Self-pay | Admitting: *Deleted

## 2019-08-22 ENCOUNTER — Ambulatory Visit: Payer: 59

## 2019-08-22 LAB — CBC
HCT: 33.9 % — ABNORMAL LOW (ref 36.0–46.0)
Hemoglobin: 9.6 g/dL — ABNORMAL LOW (ref 12.0–15.0)
MCH: 22.1 pg — ABNORMAL LOW (ref 26.0–34.0)
MCHC: 28.3 g/dL — ABNORMAL LOW (ref 30.0–36.0)
MCV: 78.1 fL — ABNORMAL LOW (ref 80.0–100.0)
Platelets: 221 10*3/uL (ref 150–400)
RBC: 4.34 MIL/uL (ref 3.87–5.11)
RDW: 15.4 % (ref 11.5–15.5)
WBC: 5.4 10*3/uL (ref 4.0–10.5)
nRBC: 0 % (ref 0.0–0.2)

## 2019-08-22 LAB — COMPREHENSIVE METABOLIC PANEL
ALT: 17 U/L (ref 0–44)
AST: 17 U/L (ref 15–41)
Albumin: 3.1 g/dL — ABNORMAL LOW (ref 3.5–5.0)
Alkaline Phosphatase: 56 U/L (ref 38–126)
Anion gap: 8 (ref 5–15)
BUN: 6 mg/dL (ref 6–20)
CO2: 24 mmol/L (ref 22–32)
Calcium: 8.5 mg/dL — ABNORMAL LOW (ref 8.9–10.3)
Chloride: 106 mmol/L (ref 98–111)
Creatinine, Ser: 0.61 mg/dL (ref 0.44–1.00)
GFR calc Af Amer: >60 mL/min (ref >60)
GFR calc non Af Amer: >60 mL/min (ref >60)
Glucose, Bld: 191 mg/dL — ABNORMAL HIGH (ref 70–99)
Potassium: 3.4 mmol/L — ABNORMAL LOW (ref 3.5–5.1)
Sodium: 138 mmol/L (ref 135–145)
Total Bilirubin: 0.5 mg/dL (ref 0.3–1.2)
Total Protein: 6.1 g/dL — ABNORMAL LOW (ref 6.5–8.1)

## 2019-08-22 LAB — GLUCOSE, CAPILLARY
Glucose-Capillary: 213 mg/dL — ABNORMAL HIGH (ref 70–99)
Glucose-Capillary: 289 mg/dL — ABNORMAL HIGH (ref 70–99)
Glucose-Capillary: 285 mg/dL — ABNORMAL HIGH (ref 70–99)
Glucose-Capillary: 276 mg/dL — ABNORMAL HIGH (ref 70–99)

## 2019-08-22 MED ORDER — POTASSIUM CHLORIDE CRYS ER 20 MEQ PO TBCR
20.0000 meq | EXTENDED_RELEASE_TABLET | Freq: Once | ORAL | Status: AC
  Administered 2019-08-22: 20 meq via ORAL
  Filled 2019-08-22: qty 1

## 2019-08-22 NOTE — Progress Notes (Signed)
Inpatient Diabetes Program Recommendations  AACE/ADA: New Consensus Statement on Inpatient Glycemic Control (2015)  Target Ranges:  Prepandial:   less than 140 mg/dL      Peak postprandial:   less than 180 mg/dL (1-2 hours)      Critically ill patients:  140 - 180 mg/dL   Lab Results  Component Value Date   GLUCAP 213 (H) 08/22/2019   HGBA1C 7.2 (H) 07/17/2019    Review of Glycemic Control Results for [facility], [facility] (MRN [id number]) as of 08/22/2019 09:21  Ref. Range 08/21/2019 07:25 08/21/2019 11:22 08/21/2019 12:19 08/21/2019 14:10 08/21/2019 16:44 08/21/2019 20:07 08/22/2019 07:27  Glucose-Capillary Latest Ref Range: 70 - 99 mg/dL 370 (H) 488 (H) 891 (H) 162 (H) 188 (H) 298 (H) 213 (H)   Diabetes history: Dm 2 Outpatient Diabetes medications: Lantus 30 units qhs, Trulicity 4.5 weekly Current orders for Inpatient glycemic control:  Lantus 10 units Novolog 0-15 units tid + hs Novolog 2 units tid meal coverage  Inpatient Diabetes Program Recommendations:    Increase Lantus to 15-18 units  Thanks,  Christena Deem RN, MSN, BC-ADM Inpatient Diabetes Coordinator Team Pager [phone] (8a-5p)

## 2019-08-22 NOTE — H&P (View-Only) (Signed)
Progress Note    ASSESSMENT AND PLAN:   # Nausea / vomiting, resolved.   # Pseudocysts with walled off necrosis.  --Cystgastrostomy not attempted yesterday as an arterial vessel seemed to be involved with the fluid collection   --Had CTA yesterday but exam was significantly limited due to body habitus.   --Plan is to proceed with repeat EUS and hopefully cystgastrostomy tomorrow with Dr. Rush Landmark   SUBJECTIVE   Tolerating a soft diet. No vomiting since Monday   OBJECTIVE:     Vital signs in last 24 hours: Temp:  [98.1 F (36.7 C)-99 F (37.2 C)] 98.2 F (36.8 C) (04/01 0626) Pulse Rate:  [93-105] 99 (04/01 0626) Resp:  [11-21] 18 (04/01 0626) BP: (135-180)/(90-111) 150/97 (04/01 0626) SpO2:  [94 %-100 %] 94 % (04/01 0626) Weight:  [135.6 kg] 135.6 kg (03/31 1154) Last BM Date: 08/16/19 General:   Alert in NAD Heart:  Regular rate and rhythm;  No lower extremity edema   Pulm: Normal respiratory effort   Abdomen:  Soft, nondistended, nontender.  Normal bowel sounds.          Psych:  Pleasant, cooperative.  Normal mood and affect.   Intake/Output from previous day: 03/31 0701 - 04/01 0700 In: 2905.1 [P.O.:840; I.V.:2065.1] Out: 0  Intake/Output this shift: Total I/O In: 245.9 [I.V.:245.9] Out: -   Lab Results: Recent Labs    08/20/19 0031 08/21/19 0436 08/22/19 0422  WBC 4.7 5.2 5.4  HGB 10.4* 9.7* 9.6*  HCT 36.3 33.5* 33.9*  PLT 243 216 221   BMET Recent Labs    08/20/19 0823 08/21/19 0436 08/22/19 0422  NA 137 140 138  K 3.6 3.5 3.4*  CL 104 107 106  CO2 21* 24 24  GLUCOSE 301* 284* 191*  BUN 7 8 6   CREATININE 0.77 0.71 0.61  CALCIUM 8.8* 8.7* 8.5*   LFT Recent Labs    08/20/19 0031 08/20/19 0031 08/22/19 0422  PROT 6.9   < > 6.1*  ALBUMIN 3.6   < > 3.1*  AST 27   < > 17  ALT 18   < > 17  ALKPHOS 64   < > 56  BILITOT 0.3   < > 0.5  BILIDIR 0.2  --   --   IBILI 0.1*  --   --    < > = values in this interval not displayed.    PT/INR Recent Labs    08/20/19 0031  LABPROT 11.9  INR 0.9   Hepatitis Panel No results for input(s): HEPBSAG, HCVAB, HEPAIGM, HEPBIGM in the last 72 hours.  CT ANGIO ABDOMEN W &/OR WO CONTRAST  Result Date: 08/21/2019 CLINICAL DATA:  Pancreatic pseudocyst with concern for arterial flow seen on recent EUS. Concern for pseudoaneurysm. EXAM: CT ANGIOGRAPHY OF ABDOMINAL AORTA TECHNIQUE: Multidetector CT imaging of the abdomen was performed using the standard protocol during bolus administration of intravenous contrast. CONTRAST:  155mL OMNIPAQUE IOHEXOL 350 MG/ML SOLN COMPARISON:  August 16, 2019 FINDINGS: VASCULAR Evaluation of the arterial vasculature it was severely limited by suboptimal contrast bolus timing. Aorta: Normal caliber aorta without aneurysm, dissection, vasculitis or significant stenosis. Celiac: Patent without evidence of aneurysm, dissection, vasculitis or significant stenosis. SMA: Patent without evidence of aneurysm, dissection, vasculitis or significant stenosis. Renals: Both renal arteries are patent without evidence of aneurysm, dissection, vasculitis, fibromuscular dysplasia or significant stenosis. IMA: Patent without evidence of aneurysm, dissection, vasculitis or significant stenosis. Veins: The portal vein is patent. The SMV is  patent. The splenic vein is significantly attenuated at the level of the pancreatic body and dominant pancreatic fluid collection (axial series 5, image 49). The distal pancreatic vein is difficult to follow and is felt to be occluded given the presence of portal systemic collaterals coursing through the patient's anterior abdomen. There is no definite CT evidence for an aneurysm or pseudoaneurysm. NON-VASCULAR Lower chest: The lung bases are clear. The heart size is normal. Hepatobiliary: There is decreased hepatic attenuation suggestive of hepatic steatosis. Normal gallbladder.There is no biliary ductal dilation. Pancreas: Again noted are multiple  peripancreatic in intrahepatic fluid collections/pseudocyst. Currently, the dominant collection measures approximately 12.1 by 7 cm (previously measuring 12.3 x 6.7 cm). The pancreatic tail collection measures approximately 4.4 by 5.4 cm (previously measuring 5.9 x 5.2 cm. Peripancreatic inflammatory changes are again noted. Pancreatic calcifications are again noted in the pancreatic head. Spleen: The spleen is enlarged measuring approximately 15.4 cm craniocaudad. This is stable in size from July 16, 2018. Adrenals/Urinary Tract: --Adrenal glands: No adrenal hemorrhage. --Right kidney/ureter: Nonobstructing right-sided nephrolithiasis is noted. --Left kidney/ureter: Nonobstructing left-sided nephrolithiasis is noted. --Urinary bladder: Outside the field of view. Stomach/Bowel: --Stomach/Duodenum: There is some wall thickening of the gastric body and fundus which is likely reactive from the nearby pancreatic collections. --Small bowel: The distal duodenum is mildly dilated without evidence for high-grade ileus or small bowel obstruction. --Colon: There is some wall thickening of the splenic flexure of the colon which is likely reactive. The remaining portions of the colon are unremarkable. --Appendix: Outside the field of view. Lymphatic: --No retroperitoneal lymphadenopathy. --No mesenteric lymphadenopathy. Other: No ascites or free air. The abdominal wall is normal. Musculoskeletal. 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 There is no definite evidence for an acute displaced fracture. IMPRESSION: 1. Evaluation of the arterial structures is significantly limited by suboptimal contrast bolus timing in the patient's habitus. Given these limitations, no pseudoaneurysm was detected. 2. 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 Portosystemic collaterals are noted. 3. The portal vein and SMV remain patent. The splenic artery is patent. 4. Stable pancreatic  fluid collections as previously described. 5. Bilateral nonobstructing nephrolithiasis. 6. Wall thickening of the splenic flexure of the colon, likely reactive. There is mild dilatation of the distal duodenum and proximal jejunum likely representing a low-grade ileus. 7. Hepatic steatosis. Electronically Signed   By: Katherine Mantle M.D.   On: 08/21/2019 21:47    Active Problems:   AKI (acute kidney injury) (HCC)   Type 2 diabetes mellitus (HCC)   Schizophrenia (HCC)   Obesity, Class III, BMI 40-49.9 (morbid obesity) (HCC)   Acute pancreatitis   Pancreatic pseudocyst   Abdominal pain   Essential hypertension   Non-intractable vomiting   Hematemesis with nausea     LOS: 5 days   Willette Cluster ,NP 08/22/2019, 11:03 AM

## 2019-08-22 NOTE — Progress Notes (Signed)
   Progress Note    ASSESSMENT AND PLAN:   # Nausea / vomiting, resolved.   # Pseudocysts with walled off necrosis.  --Cystgastrostomy not attempted yesterday as an arterial vessel seemed to be involved with the fluid collection   --Had CTA yesterday but exam was significantly limited due to body habitus.   --Plan is to proceed with repeat EUS and hopefully cystgastrostomy tomorrow with Dr. Mansouraty   SUBJECTIVE   Tolerating a soft diet. No vomiting since Monday   OBJECTIVE:     Vital signs in last 24 hours: Temp:  [98.1 F (36.7 C)-99 F (37.2 C)] 98.2 F (36.8 C) (04/01 0626) Pulse Rate:  [93-105] 99 (04/01 0626) Resp:  [11-21] 18 (04/01 0626) BP: (135-180)/(90-111) 150/97 (04/01 0626) SpO2:  [94 %-100 %] 94 % (04/01 0626) Weight:  [135.6 kg] 135.6 kg (03/31 1154) Last BM Date: 08/16/19 General:   Alert in NAD Heart:  Regular rate and rhythm;  No lower extremity edema   Pulm: Normal respiratory effort   Abdomen:  Soft, nondistended, nontender.  Normal bowel sounds.          Psych:  Pleasant, cooperative.  Normal mood and affect.   Intake/Output from previous day: 03/31 0701 - 04/01 0700 In: 2905.1 [P.O.:840; I.V.:2065.1] Out: 0  Intake/Output this shift: Total I/O In: 245.9 [I.V.:245.9] Out: -   Lab Results: Recent Labs    08/20/19 0031 08/21/19 0436 08/22/19 0422  WBC 4.7 5.2 5.4  HGB 10.4* 9.7* 9.6*  HCT 36.3 33.5* 33.9*  PLT 243 216 221   BMET Recent Labs    08/20/19 0823 08/21/19 0436 08/22/19 0422  NA 137 140 138  K 3.6 3.5 3.4*  CL 104 107 106  CO2 21* 24 24  GLUCOSE 301* 284* 191*  BUN 7 8 6  CREATININE 0.77 0.71 0.61  CALCIUM 8.8* 8.7* 8.5*   LFT Recent Labs    08/20/19 0031 08/20/19 0031 08/22/19 0422  PROT 6.9   < > 6.1*  ALBUMIN 3.6   < > 3.1*  AST 27   < > 17  ALT 18   < > 17  ALKPHOS 64   < > 56  BILITOT 0.3   < > 0.5  BILIDIR 0.2  --   --   IBILI 0.1*  --   --    < > = values in this interval not displayed.    PT/INR Recent Labs    08/20/19 0031  LABPROT 11.9  INR 0.9   Hepatitis Panel No results for input(s): HEPBSAG, HCVAB, HEPAIGM, HEPBIGM in the last 72 hours.  CT ANGIO ABDOMEN W &/OR WO CONTRAST  Result Date: 08/21/2019 CLINICAL DATA:  Pancreatic pseudocyst with concern for arterial flow seen on recent EUS. Concern for pseudoaneurysm. EXAM: CT ANGIOGRAPHY OF ABDOMINAL AORTA TECHNIQUE: Multidetector CT imaging of the abdomen was performed using the standard protocol during bolus administration of intravenous contrast. CONTRAST:  100mL OMNIPAQUE IOHEXOL 350 MG/ML SOLN COMPARISON:  August 16, 2019 FINDINGS: VASCULAR Evaluation of the arterial vasculature it was severely limited by suboptimal contrast bolus timing. Aorta: Normal caliber aorta without aneurysm, dissection, vasculitis or significant stenosis. Celiac: Patent without evidence of aneurysm, dissection, vasculitis or significant stenosis. SMA: Patent without evidence of aneurysm, dissection, vasculitis or significant stenosis. Renals: Both renal arteries are patent without evidence of aneurysm, dissection, vasculitis, fibromuscular dysplasia or significant stenosis. IMA: Patent without evidence of aneurysm, dissection, vasculitis or significant stenosis. Veins: The portal vein is patent. The SMV is   patent. The splenic vein is significantly attenuated at the level of the pancreatic body and dominant pancreatic fluid collection (axial series 5, image 49). The distal pancreatic vein is difficult to follow and is felt to be occluded given the presence of portal systemic collaterals coursing through the patient's anterior abdomen. There is no definite CT evidence for an aneurysm or pseudoaneurysm. NON-VASCULAR Lower chest: The lung bases are clear. The heart size is normal. Hepatobiliary: There is decreased hepatic attenuation suggestive of hepatic steatosis. Normal gallbladder.There is no biliary ductal dilation. Pancreas: Again noted are multiple  peripancreatic in intrahepatic fluid collections/pseudocyst. Currently, the dominant collection measures approximately 12.1 by 7 cm (previously measuring 12.3 x 6.7 cm). The pancreatic tail collection measures approximately 4.4 by 5.4 cm (previously measuring 5.9 x 5.2 cm. Peripancreatic inflammatory changes are again noted. Pancreatic calcifications are again noted in the pancreatic head. Spleen: The spleen is enlarged measuring approximately 15.4 cm craniocaudad. This is stable in size from July 16, 2018. Adrenals/Urinary Tract: --Adrenal glands: No adrenal hemorrhage. --Right kidney/ureter: Nonobstructing right-sided nephrolithiasis is noted. --Left kidney/ureter: Nonobstructing left-sided nephrolithiasis is noted. --Urinary bladder: Outside the field of view. Stomach/Bowel: --Stomach/Duodenum: There is some wall thickening of the gastric body and fundus which is likely reactive from the nearby pancreatic collections. --Small bowel: The distal duodenum is mildly dilated without evidence for high-grade ileus or small bowel obstruction. --Colon: There is some wall thickening of the splenic flexure of the colon which is likely reactive. The remaining portions of the colon are unremarkable. --Appendix: Outside the field of view. Lymphatic: --No retroperitoneal lymphadenopathy. --No mesenteric lymphadenopathy. Other: No ascites or free air. The abdominal wall is normal. Musculoskeletal. 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 There is no definite evidence for an acute displaced fracture. IMPRESSION: 1. Evaluation of the arterial structures is significantly limited by suboptimal contrast bolus timing in the patient's habitus. Given these limitations, no pseudoaneurysm was detected. 2. 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 Portosystemic collaterals are noted. 3. The portal vein and SMV remain patent. The splenic artery is patent. 4. Stable pancreatic  fluid collections as previously described. 5. Bilateral nonobstructing nephrolithiasis. 6. Wall thickening of the splenic flexure of the colon, likely reactive. There is mild dilatation of the distal duodenum and proximal jejunum likely representing a low-grade ileus. 7. Hepatic steatosis. Electronically Signed   By: Katherine Mantle M.D.   On: 08/21/2019 21:47    Active Problems:   AKI (acute kidney injury) (HCC)   Type 2 diabetes mellitus (HCC)   Schizophrenia (HCC)   Obesity, Class III, BMI 40-49.9 (morbid obesity) (HCC)   Acute pancreatitis   Pancreatic pseudocyst   Abdominal pain   Essential hypertension   Non-intractable vomiting   Hematemesis with nausea     LOS: 5 days   Willette Cluster ,NP 08/22/2019, 11:03 AM

## 2019-08-22 NOTE — Progress Notes (Signed)
PROGRESS NOTE    Monique Holland    Code Status: Full Code  CHE:[id number] DOB: 12-Jun-1965 DOA: 08/16/2019 LOS: 5 days  PCP: Kristie Cowman, MD CC:  Chief Complaint  Patient presents with  . Nausea  . Emesis       Hospital Summary   54 year old with past medical history significant for ID DM2, hypertension, gastritis, chronic pancreatitis with pseudocyst, morbid obesity who presents complaining of abdominal pain and vomiting a small amount of blood.  -Patient last hospitalization from 08/02/2019 130 07/1618 21 for epigastric abdominal pain nausea vomiting thought to be secondary to gastritis esophagitis.  Renal admission she had an MRCP which showed 12.6 cm pseudocyst, unchanged from recent CT scan. 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  Patient admitted with recurrent vomiting.  Underwent endoscopy which was normal except for Mild to moderate extrinsic compression in the region of duodenal bulb from Pancreatic pseudocyst.    3/31: EGD/EUS with Dr. Rush Landmark -acquired extrinsic deformity and posterior wall of stomach, erythematous mucosa of gastric body and antrum.  85 x 84 mm cyst walled off necrosis in peripancreatic region consistent with pancreatic pseudocyst and necrosis with 1 enlarged lymph node.  Further imaging ordered by GI for further evaluation to evaluate for potential pseudoaneurysm  A & P   Active Problems:   AKI (acute kidney injury) ([facility])   Type 2 diabetes mellitus (HCC)   Schizophrenia (HCC)   Obesity, Class III, BMI 40-49.9 (morbid obesity) ([place])   Acute pancreatitis   Pancreatic pseudocyst   Abdominal pain   Essential hypertension   Non-intractable vomiting   Hematemesis with nausea   1. Acute on chronic abdominal pain, chronic  pancreatitis, pancreatic pseudocyst, persistent vomiting a. S/p EGD/EUS 3/31:acquired extrinsic deformity and posterior wall of stomach, erythematous mucosa of gastric body and antrum.  85 x 84 mm cyst walled off necrosis in peripancreatic region consistent with pancreatic pseudocyst and necrosis with 1 enlarged lymph node.   b. CTA abdomen ordered by GI for further evaluation to evaluate for potential pseudoaneurysm - no pseudoaneurysm noted. c. EUS and possible cystgastrostomy tomorrow with Dr. Rush Landmark  2. Incidental Splenic Vein Occlusion Noted on CT angio a. Discussed with vascular surgery.  Patient's abdominal pain is likely from pseudocyst and chronic pancreatitis and splenic vein occlusion is likely incidental asymptomatic finding.  Will hold off on anticoagulation at this time however if patient becomes symptomatic in the future and/or there is interval progression with recommend starting anticoagulation at that time  3. Hematemesis a. No recurrence b. EGD findings as above c. Appreciate further GI recommendations  4. Diarrhea, resolved  5. Hypokalemia, replete   6. AKI, resolved  7. Type 2 diabetes a. Continue Lantus 10 units daily, NovoLog 2 units 3 times daily with meals and sliding scale  8. Asthma, stable on room air  9. Hypertension a. Continue Lopressor and amlodipine  10. Anxiety/depression/ Schizophrenia BPD a. Continue trazodone, amitriptyline, Latuda  DVT prophylaxis: SCDs Family Communication: Patient stated at bedside Disposition Plan:   Patient came from:   Home  Anticipated d/c place: Home  Barriers to d/c: Pending further GI recommendations  Pressure injury documentation    None  Consultants  GI Discussed findings of CTA abdomen with Vascular surgery   Procedures  3/31: EGD/EUS  Antibiotics   Anti-infectives (From admission, onward)   None         Subjective   Denies any new complaints.  Tolerating diet.  Eating lunch at time of interview.  No issues overnight  Objective   Vitals:   08/21/19 1410 08/21/19 1420 08/21/19 2043 08/22/19 0626  BP: (!) 166/102 (!) 157/94 (!) 135/92 (!) 150/97  Pulse: 99 97 (!) 105 99  Resp: (!) 21 18 16 18   Temp:  98.5 F (36.9 C) 98.1 F (36.7 C) 98.2 F (36.8 C)  TempSrc:  Oral Oral Oral  SpO2: 98% 96% 97% 94%  Weight:      Height:        Intake/Output Summary (Last 24 hours) at 08/22/2019 0718 Last data filed at 08/22/2019 0630 Gross per 24 hour  Intake 2905.07 ml  Output 0 ml  Net 2905.07 ml   Filed Weights   08/17/19 0529 08/18/19 1059 08/21/19 1154  Weight: (!) 138.4 kg 135.6 kg 135.6 kg    Examination:  Physical Exam Vitals and nursing note reviewed.  Constitutional:      Appearance: Normal appearance.  HENT:     Head: Normocephalic and atraumatic.  Eyes:     Conjunctiva/sclera: Conjunctivae normal.  Cardiovascular:     Rate and Rhythm: Normal rate and regular rhythm.  Pulmonary:     Effort: Pulmonary effort is normal.     Breath sounds: Normal breath sounds.  Abdominal:     General: Abdomen is flat.     Palpations: Abdomen is soft.  Musculoskeletal:        General: No swelling or tenderness.  Skin:    Coloration: Skin is not jaundiced or pale.  Neurological:     Mental Status: She is alert. Mental status is at baseline.  Psychiatric:        Mood and Affect: Mood normal.        Behavior: Behavior normal.     Data Reviewed: I have personally reviewed following labs and imaging studies  CBC: Recent Labs  Lab 08/16/19 1920 08/17/19 0522 08/18/19 0453 08/18/19 1230 08/20/19 0031 08/21/19 0436 08/22/19 0422  WBC 7.9   < > 5.5 5.7 4.7 5.2 5.4  NEUTROABS 4.1  --   --   --   --   --   --   HGB 10.5*   < > 9.9* 10.4* 10.4* 9.7* 9.6*  HCT 36.0   < > 33.9* 36.4 36.3 33.5* 33.9*  MCV 78.9*   < > 77.0* 77.6* 78.2* 77.5* 78.1*  PLT 297   < > 183 249 243  216 221   < > = values in this interval not displayed.   Basic Metabolic Panel: Recent Labs  Lab 08/17/19 0522 08/18/19 0453 08/20/19 0031 08/20/19 0823 08/21/19 0436 08/22/19 0422  NA 137 134*  --  137 140 138  K 3.3* 3.7  --  3.6 3.5 3.4*  CL 104 102  --  104 107 106  CO2 24 21*  --  21* 24 24  GLUCOSE 250* 227*  --  301* 284* 191*  BUN 12 6  --  7 8 6   CREATININE 0.92 0.77  --  0.77 0.71 0.61  CALCIUM 8.7* 8.4*  --  8.8* 8.7* 8.5*  MG  --   --  1.7  --   --   --   PHOS  --   --  3.6  --   --   --    GFR: Estimated Creatinine Clearance: 120.3 mL/min (by C-G formula based on SCr of 0.61 mg/dL). Liver Function Tests: Recent Labs  Lab 08/16/19 1920 08/17/19 0522 08/20/19 0031 08/22/19 0422  AST 34 28 27 17   ALT 25 23 18 17   ALKPHOS 77 71 64 56  BILITOT 0.7 0.6 0.3 0.5  PROT 7.0 6.4* 6.9 6.1*  ALBUMIN 3.7 3.4* 3.6 3.1*   Recent Labs  Lab 08/16/19 1920  LIPASE 35   No results for input(s): AMMONIA in the last 168 hours. Coagulation Profile: Recent Labs  Lab 08/20/19 0031  INR 0.9   Cardiac Enzymes: No results for input(s): CKTOTAL, CKMB, CKMBINDEX, TROPONINI in the last 168 hours. BNP (last 3 results) No results for input(s): PROBNP in the last 8760 hours. HbA1C: No results for input(s): HGBA1C in the last 72 hours. CBG: Recent Labs  Lab 08/21/19 1122 08/21/19 1219 08/21/19 1410 08/21/19 1644 08/21/19 2007  GLUCAP 181* 173* 162* 188* 298*   Lipid Profile: No results for input(s): CHOL, HDL, LDLCALC, TRIG, CHOLHDL, LDLDIRECT in the last 72 hours. Thyroid Function Tests: No results for input(s): TSH, T4TOTAL, FREET4, T3FREE, THYROIDAB in the last 72 hours. Anemia Panel: No results for input(s): VITAMINB12, FOLATE, FERRITIN, TIBC, IRON, RETICCTPCT in the last 72 hours. Sepsis Labs: No results for input(s): PROCALCITON, LATICACIDVEN in the last 168 hours.  Recent Results (from the past 240 hour(s))  SARS CORONAVIRUS 2 (TAT 6-24 HRS) Nasopharyngeal  Nasopharyngeal Swab     Status: None   Collection Time: 08/17/19  2:10 AM   Specimen: Nasopharyngeal Swab  Result Value Ref Range Status   SARS Coronavirus 2 NEGATIVE NEGATIVE Final    Comment: (NOTE) SARS-CoV-2 target nucleic acids are NOT DETECTED. The SARS-CoV-2 RNA is generally detectable in upper and lower respiratory specimens during the acute phase of infection. Negative results do not preclude SARS-CoV-2 infection, do not rule out co-infections with other pathogens, and should not be used as the sole basis for treatment or other patient management decisions. Negative results must be combined with clinical observations, patient history, and epidemiological information. The expected result is Negative. Fact Sheet for Patients: 08/23/19 Fact Sheet for Healthcare Providers: 08/19/19 This test is not yet approved or cleared by the [employer] FDA and  has been authorized for detection and/or diagnosis of SARS-CoV-2 by FDA under an Emergency Use Authorization (EUA). This EUA will remain  in effect (meaning this test can be used) for the duration of the COVID-19 declaration under Section 56 4(b)(1) of the Act, 21 U.S.C. section 360bbb-3(b)(1), unless the authorization is terminated or revoked sooner. Performed at [facility], [address] [place], [address] [place]          Radiology Studies: CT ANGIO ABDOMEN W &/OR WO CONTRAST  Result Date: 08/21/2019 CLINICAL DATA:  Pancreatic pseudocyst with concern for arterial flow seen on recent EUS. Concern for pseudoaneurysm. EXAM: CT ANGIOGRAPHY OF ABDOMINAL AORTA TECHNIQUE: Multidetector CT imaging of the abdomen was performed using the standard protocol during bolus administration of intravenous contrast. CONTRAST:  [id number] OMNIPAQUE IOHEXOL 350 MG/ML SOLN COMPARISON:  August 16, 2019 FINDINGS: VASCULAR Evaluation of the arterial vasculature it was severely  limited by suboptimal contrast bolus timing. Aorta: Normal caliber aorta without aneurysm, dissection, vasculitis or significant stenosis. Celiac: Patent without evidence of aneurysm, dissection, vasculitis or  significant stenosis. SMA: Patent without evidence of aneurysm, dissection, vasculitis or significant stenosis. Renals: Both renal arteries are patent without evidence of aneurysm, dissection, vasculitis, fibromuscular dysplasia or significant stenosis. IMA: Patent without evidence of aneurysm, dissection, vasculitis or significant stenosis. Veins: The portal vein is patent. The SMV is patent. The splenic vein is significantly attenuated at the level of the pancreatic body and dominant pancreatic fluid collection (axial series 5, image 49). The distal pancreatic vein is difficult to follow and is felt to be occluded given the presence of portal systemic collaterals coursing through the patient's anterior abdomen. There is no definite CT evidence for an aneurysm or pseudoaneurysm. NON-VASCULAR Lower chest: The lung bases are clear. The heart size is normal. Hepatobiliary: There is decreased hepatic attenuation suggestive of hepatic steatosis. Normal gallbladder.There is no biliary ductal dilation. Pancreas: Again noted are multiple peripancreatic in intrahepatic fluid collections/pseudocyst. Currently, the dominant collection measures approximately 12.1 by 7 cm (previously measuring 12.3 x 6.7 cm). The pancreatic tail collection measures approximately 4.4 by 5.4 cm (previously measuring 5.9 x 5.2 cm. Peripancreatic inflammatory changes are again noted. Pancreatic calcifications are again noted in the pancreatic head. Spleen: The spleen is enlarged measuring approximately 15.4 cm craniocaudad. This is stable in size from July 16, 2018. Adrenals/Urinary Tract: --Adrenal glands: No adrenal hemorrhage. --Right kidney/ureter: Nonobstructing right-sided nephrolithiasis is noted. --Left kidney/ureter:  Nonobstructing left-sided nephrolithiasis is noted. --Urinary bladder: Outside the field of view. Stomach/Bowel: --Stomach/Duodenum: There is some wall thickening of the gastric body and fundus which is likely reactive from the nearby pancreatic collections. --Small bowel: The distal duodenum is mildly dilated without evidence for high-grade ileus or small bowel obstruction. --Colon: There is some wall thickening of the splenic flexure of the colon which is likely reactive. The remaining portions of the colon are unremarkable. --Appendix: Outside the field of view. Lymphatic: --No retroperitoneal lymphadenopathy. --No mesenteric lymphadenopathy. Other: No ascites or free air. The abdominal wall is normal. Musculoskeletal. 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 There is no definite evidence for an acute displaced fracture. IMPRESSION: 1. Evaluation of the arterial structures is significantly limited by suboptimal contrast bolus timing in the patient's habitus. Given these limitations, no pseudoaneurysm was detected. 2. 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 Portosystemic collaterals are noted. 3. The portal vein and SMV remain patent. The splenic artery is patent. 4. Stable pancreatic fluid collections as previously described. 5. Bilateral nonobstructing nephrolithiasis. 6. Wall thickening of the splenic flexure of the colon, likely reactive. There is mild dilatation of the distal duodenum and proximal jejunum likely representing a low-grade ileus. 7. Hepatic steatosis. Electronically Signed   By: Katherine Mantle M.D.   On: 08/21/2019 21:47        Scheduled Meds: . amitriptyline  75 mg Oral QHS  . amLODipine  10 mg Oral Daily  . insulin aspart  0-15 Units Subcutaneous TID WC  . insulin aspart  0-5 Units Subcutaneous QHS  . insulin aspart  2 Units Subcutaneous TID WC  . insulin glargine  10 Units Subcutaneous Daily  . lamoTRIgine  200 mg  Oral Daily  . lurasidone  60 mg Oral QHS  . metoprolol tartrate  25 mg Oral BID  . pantoprazole  40 mg Oral Q12H  . polyethylene glycol  17 g Oral BID  . potassium chloride  20 mEq Oral Once  . traZODone  100 mg Oral QHS   Continuous Infusions: . sodium chloride 100 mL/hr  at 08/21/19 2216     Time spent: 30  minutes with over 50% of the time coordinating the patient's care    Jae DireJared E Vernell Back, DO [facility] Pager [phone]  Call night coverage person covering after 7pm

## 2019-08-23 ENCOUNTER — Encounter (HOSPITAL_COMMUNITY)
Admission: EM | Disposition: A | Discharge status: Home / Self Care | Payer: Self-pay | Source: Home / Self Care | Attending: Internal Medicine

## 2019-08-23 ENCOUNTER — Inpatient Hospital Stay (HOSPITAL_COMMUNITY): Payer: 59 | Admitting: Anesthesiology

## 2019-08-23 ENCOUNTER — Encounter (HOSPITAL_COMMUNITY): Payer: Self-pay | Admitting: Family Medicine

## 2019-08-23 HISTORY — PX: PANCREATIC STENT PLACEMENT: SHX5539

## 2019-08-23 HISTORY — PX: BIOPSY: SHX5522

## 2019-08-23 HISTORY — PX: EUS: SHX5427

## 2019-08-23 HISTORY — PX: BALLOON DILATION: SHX5330

## 2019-08-23 HISTORY — PX: ESOPHAGOGASTRODUODENOSCOPY (EGD) WITH PROPOFOL: SHX5813

## 2019-08-23 LAB — CBC
HCT: 32.7 % — ABNORMAL LOW (ref 36.0–46.0)
Hemoglobin: 9.3 g/dL — ABNORMAL LOW (ref 12.0–15.0)
MCH: 22 pg — ABNORMAL LOW (ref 26.0–34.0)
MCHC: 28.4 g/dL — ABNORMAL LOW (ref 30.0–36.0)
MCV: 77.3 fL — ABNORMAL LOW (ref 80.0–100.0)
Platelets: 218 10*3/uL (ref 150–400)
RBC: 4.23 MIL/uL (ref 3.87–5.11)
RDW: 15.2 % (ref 11.5–15.5)
WBC: 5 10*3/uL (ref 4.0–10.5)
nRBC: 0 % (ref 0.0–0.2)

## 2019-08-23 LAB — BASIC METABOLIC PANEL
Anion gap: 8 (ref 5–15)
BUN: 5 mg/dL — ABNORMAL LOW (ref 6–20)
CO2: 25 mmol/L (ref 22–32)
Calcium: 8.6 mg/dL — ABNORMAL LOW (ref 8.9–10.3)
Chloride: 106 mmol/L (ref 98–111)
Creatinine, Ser: 0.7 mg/dL (ref 0.44–1.00)
GFR calc Af Amer: >60 mL/min (ref >60)
GFR calc non Af Amer: >60 mL/min (ref >60)
Glucose, Bld: 233 mg/dL — ABNORMAL HIGH (ref 70–99)
Potassium: 3.8 mmol/L (ref 3.5–5.1)
Sodium: 139 mmol/L (ref 135–145)

## 2019-08-23 LAB — GLUCOSE, CAPILLARY
Glucose-Capillary: 193 mg/dL — ABNORMAL HIGH (ref 70–99)
Glucose-Capillary: 201 mg/dL — ABNORMAL HIGH (ref 70–99)
Glucose-Capillary: 239 mg/dL — ABNORMAL HIGH (ref 70–99)
Glucose-Capillary: 206 mg/dL — ABNORMAL HIGH (ref 70–99)
Glucose-Capillary: 168 mg/dL — ABNORMAL HIGH (ref 70–99)

## 2019-08-23 SURGERY — ESOPHAGOGASTRODUODENOSCOPY (EGD) WITH PROPOFOL
Anesthesia: General

## 2019-08-23 MED ORDER — PROPOFOL 10 MG/ML IV BOLUS
INTRAVENOUS | Status: DC | PRN
  Administered 2019-08-23: 200 mg via INTRAVENOUS

## 2019-08-23 MED ORDER — PROPOFOL 10 MG/ML IV BOLUS
INTRAVENOUS | Status: AC
  Filled 2019-08-23: qty 20

## 2019-08-23 MED ORDER — CARVEDILOL 6.25 MG PO TABS
6.2500 mg | ORAL_TABLET | Freq: Two times a day (BID) | ORAL | Status: DC
  Administered 2019-08-23: 6.25 mg via ORAL
  Filled 2019-08-23: qty 1

## 2019-08-23 MED ORDER — FENTANYL CITRATE (PF) 250 MCG/5ML IJ SOLN
INTRAMUSCULAR | Status: DC | PRN
  Administered 2019-08-23: 100 ug via INTRAVENOUS
  Administered 2019-08-23: 50 ug via INTRAVENOUS

## 2019-08-23 MED ORDER — PHENYLEPHRINE 40 MCG/ML (10ML) SYRINGE FOR IV PUSH (FOR BLOOD PRESSURE SUPPORT)
PREFILLED_SYRINGE | INTRAVENOUS | Status: DC | PRN
  Administered 2019-08-23: 80 ug via INTRAVENOUS

## 2019-08-23 MED ORDER — ONDANSETRON HCL 4 MG/2ML IJ SOLN
INTRAMUSCULAR | Status: DC | PRN
  Administered 2019-08-23: 4 mg via INTRAVENOUS

## 2019-08-23 MED ORDER — LACTATED RINGERS IV SOLN: INTRAVENOUS | Status: DC | PRN

## 2019-08-23 MED ORDER — FENTANYL CITRATE (PF) 250 MCG/5ML IJ SOLN
INTRAMUSCULAR | Status: AC
  Filled 2019-08-23: qty 5

## 2019-08-23 MED ORDER — PIPERACILLIN-TAZOBACTAM 3.375 G IVPB
3.3750 g | Freq: Three times a day (TID) | INTRAVENOUS | Status: DC
  Administered 2019-08-23 – 2019-08-24 (×3): 3.375 g via INTRAVENOUS
  Filled 2019-08-23 (×2): qty 50

## 2019-08-23 MED ORDER — CIPROFLOXACIN IN D5W 400 MG/200ML IV SOLN
INTRAVENOUS | Status: AC
  Filled 2019-08-23: qty 200

## 2019-08-23 MED ORDER — SUGAMMADEX SODIUM 500 MG/5ML IV SOLN
INTRAVENOUS | Status: DC | PRN
  Administered 2019-08-23: 271.2 mg via INTRAVENOUS

## 2019-08-23 MED ORDER — INSULIN ASPART 100 UNIT/ML ~~LOC~~ SOLN
4.0000 [IU] | Freq: Three times a day (TID) | SUBCUTANEOUS | Status: DC
  Administered 2019-08-23 – 2019-08-24 (×2): 4 [IU] via SUBCUTANEOUS

## 2019-08-23 MED ORDER — CIPROFLOXACIN IN D5W 400 MG/200ML IV SOLN
INTRAVENOUS | Status: DC | PRN
  Administered 2019-08-23: 400 mg via INTRAVENOUS

## 2019-08-23 MED ORDER — GLUCAGON HCL RDNA (DIAGNOSTIC) 1 MG IJ SOLR
INTRAMUSCULAR | Status: AC
  Filled 2019-08-23: qty 1

## 2019-08-23 MED ORDER — EPINEPHRINE 1 MG/10ML IJ SOSY
PREFILLED_SYRINGE | INTRAMUSCULAR | Status: AC
  Filled 2019-08-23: qty 10

## 2019-08-23 MED ORDER — CIPROFLOXACIN IN D5W 400 MG/200ML IV SOLN: 400.0000 mg | Freq: Two times a day (BID) | INTRAVENOUS | Status: DC

## 2019-08-23 MED ORDER — INSULIN GLARGINE 100 UNIT/ML ~~LOC~~ SOLN
15.0000 [IU] | Freq: Every day | SUBCUTANEOUS | Status: DC
  Administered 2019-08-24 – 2019-08-27 (×4): 15 [IU] via SUBCUTANEOUS
  Filled 2019-08-23 (×4): qty 0.15

## 2019-08-23 MED ORDER — ROCURONIUM BROMIDE 10 MG/ML (PF) SYRINGE
PREFILLED_SYRINGE | INTRAVENOUS | Status: DC | PRN
  Administered 2019-08-23: 50 mg via INTRAVENOUS

## 2019-08-23 MED ORDER — LIDOCAINE 2% (20 MG/ML) 5 ML SYRINGE
INTRAMUSCULAR | Status: DC | PRN
  Administered 2019-08-23: 50 mg via INTRAVENOUS

## 2019-08-23 MED ORDER — SODIUM CHLORIDE 0.9 % IV SOLN: INTRAVENOUS | Status: DC | PRN

## 2019-08-23 MED ORDER — LABETALOL HCL 5 MG/ML IV SOLN
INTRAVENOUS | Status: DC | PRN
  Administered 2019-08-23: 5 mg via INTRAVENOUS

## 2019-08-23 NOTE — Progress Notes (Signed)
Inpatient Diabetes Program Recommendations  AACE/ADA: New Consensus Statement on Inpatient Glycemic Control (2015)  Target Ranges:  Prepandial:   less than 140 mg/dL      Peak postprandial:   less than 180 mg/dL (1-2 hours)      Critically ill patients:  140 - 180 mg/dL   Results for ALANIE, SYLER [facility] (MRN [id number]) as of 08/23/2019 13:29  Ref. Range 08/22/2019 07:27 08/22/2019 11:46 08/22/2019 16:43 08/22/2019 21:30  Glucose-Capillary Latest Ref Range: 70 - 99 mg/dL 865 (H)  5 units NOVOLOG  289 (H)  8 units NOVOLOG +  10 units LANTUS  285 (H)  10 units NOVOLOG  276 (H)  3 units NOVOLOG    Results for PARIS, HOHN [facility] (MRN [id number]) as of 08/23/2019 13:29  Ref. Range 08/23/2019 07:12 08/23/2019 09:31 08/23/2019 12:11  Glucose-Capillary Latest Ref Range: 70 - 99 mg/dL 284 (H) 132 (H) 440 (H)  7 units NOVOLOG      Home DM Meds: Lantus 30 units QHS       Trulicity 4.5 mg Qweek?   Current Orders: Lantus 10 units Daily      Novolog Moderate Correction Scale/ SSI (0-15 units) TID AC + HS      Novolog 2 units TID with meals    Was NPO this AM for Upper ENDO.   CBGs remain elevated.    MD- Please consider the following in-hospital insulin adjustments:  1. Increase Lantus to 15 units Daily  2. Increase Novolog Meal Coverage to: Novolog 4 units TID with meals    --Will follow patient during hospitalization--  Ambrose Finland RN, MSN, CDE Diabetes Coordinator Inpatient Glycemic Control Team Team Pager: [phone] (8a-5p)

## 2019-08-23 NOTE — Op Note (Addendum)
[facility] Patient Name: Monique Holland Procedure Date: 08/23/2019 MRN: [id number] Attending MD: Justice Britain , MD Date of Birth: Apr 07, 1966 CSN: [id number] Age: 54 Admit Type: Inpatient Procedure:                Upper EUS Indications:              Acute pancreatitis, Pancreatic cyst, Pancreatic                            necrosis, Pancreatic pseudocyst, Generalized                            abdominal pain, Generalized abdominal distress,                            Nausea with vomiting Providers:                Justice Britain, MD, Carlyn Reichert, RN, Elspeth Cho Tech., Technician, Anne Fu                            CRNA, CRNA Referring MD:              Medicines:                General Anesthesia Complications:            No immediate complications. Estimated Blood Loss:     Estimated blood loss was minimal. Procedure:                Pre-Anesthesia Assessment:                           - Prior to the procedure, a History and Physical                            was performed, and patient medications and                            allergies were reviewed. The patient's tolerance of                            previous anesthesia was also reviewed. The risks                            and benefits of the procedure and the sedation                            options and risks were discussed with the patient.                            All questions were answered, and informed consent                            was obtained. Prior Anticoagulants: The patient has  taken no previous anticoagulant or antiplatelet                            agents. ASA Grade Assessment: III - A patient with                            severe systemic disease. After reviewing the risks                            and benefits, the patient was deemed in                            satisfactory condition to undergo the  procedure.                           After obtaining informed consent, the endoscope was                            passed under direct vision. Throughout the                            procedure, the patient's blood pressure, pulse, and                            oxygen saturations were monitored continuously. The                            [id number] ([id number]) Olympus therapeutic endoscope                            was introduced through the mouth, and advanced to                            the second part of duodenum. The [id number]                            ([id number]) Olympus Linear EUS was introduced through                            the mouth, and advanced to the stomach for                            ultrasound examination. The upper EUS was                            accomplished without difficulty. The patient                            tolerated the procedure. Scope In: Scope Out: Findings:      ENDOSCOPIC FINDING: :      White nummular lesions were noted in the entire esophagus. Biopsies were       taken with a cold forceps for histology.      The Z-line was regular and was found 42 cm from the  incisors.      Patchy mildly erythematous mucosa without bleeding was found in the       gastric body and in the gastric antrum.      An extrinsic deformity was found on the posterior wall of the stomach.      No gross lesions were noted in the duodenal bulb, in the first portion       of the duodenum and in the second portion of the duodenum.      ENDOSONOGRAPHIC FINDING: :      A septated lesion suggestive of a cyst with walled off necrosis was       identified in the peripancreatic region in the body region of pancreas.       The lesion measured 86 mm by 75 mm in maximal cross-sectional diameter.       There was a single compartment. The outer wall of the lesion was thick.       There was no associated mass. There was internal debris within the       fluid-filled cavity consistent  with walled off necrosis. The decision       was made to create a cystogastrostomy using the AXIOS stent system. Once       an appropriate position in the stomach was identified, the common wall       between the stomach and the cyst was interrogated utilizing color       Doppler imaging to identify interposed vessels. The stomach wall and the       cyst were punctured under endosonographic guidance using the AXIOS stent       and electrocautery device. Current was applied to the cautery tip and       then used to increase the diameter of the stoma. The AXIOS device was       advanced into the cyst, and a 20 x 10 mm AXIOS stent was placed with the       flanges in close approximation to the walls of the cyst and the stomach       through the cystogastrostomy. The stent was successfully placed. A TTS       dilator was passed through the scope. Dilation with a 15-16.5-18 mm       pyloric balloon dilator was performed up to 18 mm. We removed 700 mL.       The cyst was completely filled with fluid and black necrotic tissue that       was pasty and adherent to the cyst wall. To see if I could remove some       of the necrosis I used a rat-toothed forceps, requiring numerous       intubations of the cyst but it was very thick in nature. To not disturb       the newly placed cystgastrostomy, Necrosectomy was not performed. Two       double pigtail stents (10 Fr x 5 cm & 7 Fr x 5 cm) were placed through       the AXIOS to decrease chance of necroma compressing the area.      No malignant-appearing lymph nodes were visualized in the peripancreatic       region.      Endosonographic imaging in the visualized portion of the liver showed no       mass-lesion. Impression:               - White nummular lesions in esophageal  mucosa.                            Biopsied to rule out Candida.                           - Z-line regular, 42 cm from the incisors.                           - Erythematous mucosa  in the gastric body and                            antrum.                           - Acquired deformity in the posterior wall of the                            stomach.                           - No gross lesions in the duodenal bulb, in the                            first portion of the duodenum and in the second                            portion of the duodenum.                           EUS Impression:                           - A cystic lesion was seen in the peripancreatic                            region. Tissue has not been obtained. However, the                            endosonographic appearance is consistent with a                            pancreatic pseudocyst with walled off necrosis.                            Cystgastrostomy created, double pigtails placed.                           - No malignant-appearing lymph nodes were                            visualized in the peripancreatic region. Moderate Sedation:      Not Applicable - Patient had care per Anesthesia. Recommendation:           - The patient will be observed post-procedure,  until all discharge criteria are met.                           - Return patient to hospital ward for ongoing care.                           - Observe patient's clinical course.                           - Await path results.                           - Await culture data should antibiotics need to be                            adjusted.                           - Stop all PPI therapy currently.                           - Start Ciprofloxacin 500 mg twice daily until                            completion of necrosectomies.                           - Clear liquid diet this morning and early                            afternoon, if doing well then may trial soft diet                            thereafter.                           - Plan for attempt at Necrosectomy on 08/26/19 as a                             daytrip to [facility].                           - If any evidence of concern for bleeding, would                            recommend consideration of EGD and if findings of                            bleeding from within cavity, then likely IR                            evaluation for potential intervention.                           - The findings and recommendations were discussed  with the patient and patient's family.                           - The findings and recommendations were discussed                            with the referring physician. Procedure Code(s):        --- Professional ---                           [phone], Esophagogastroduodenoscopy, flexible,                            transoral; with transmural drainage of pseudocyst                            (includes placement of transmural drainage                            catheter[s]/stent[s], when performed, and                            endoscopic ultrasound, when performed)                           43237, Esophagogastroduodenoscopy, flexible,                            transoral; with endoscopic ultrasound examination                            limited to the esophagus, stomach or duodenum, and                            adjacent structures                           43245, Esophagogastroduodenoscopy, flexible,                            transoral; with dilation of gastric/duodenal                            stricture(s) (eg, balloon, bougie)                           73532, Unlisted procedure, pancreas Diagnosis Code(s):        --- Professional ---                           K22.8, Other specified diseases of esophagus                           K31.89, Other diseases of stomach and duodenum                           K86.2, Cyst of pancreas  I89.9, Noninfective disorder of lymphatic vessels                            and lymph nodes, unspecified                            K85.90, Acute pancreatitis without necrosis or                            infection, unspecified                           K86.89, Other specified diseases of pancreas                           K86.3, Pseudocyst of pancreas                           R10.84, Generalized abdominal pain                           R11.2, Nausea with vomiting, unspecified CPT copyright 2019 [employer]. All rights reserved. The codes documented in this report are preliminary and upon coder review may  be revised to meet current compliance requirements. Justice Britain, MD 08/23/2019 9:32:37 AM Number of Addenda: 0

## 2019-08-23 NOTE — Transfer of Care (Signed)
Immediate Anesthesia Transfer of Care Note  Patient: Monique Holland  Procedure(s) Performed: Procedure(s): ESOPHAGOGASTRODUODENOSCOPY (EGD) WITH PROPOFOL (N/A) UPPER ENDOSCOPIC ULTRASOUND (EUS) LINEAR BIOPSY PANCREATIC STENT PLACEMENT CYST GASTROSTOMY BALLOON DILATION (N/A)  Patient Location: PACU  Anesthesia Type:General  Level of Consciousness:  sedated, patient cooperative and responds to stimulation  Airway & Oxygen Therapy:Patient Spontanous Breathing and Patient connected to face mask oxgen  Post-op Assessment:  Report given to PACU RN and Post -op Vital signs reviewed and stable  Post vital signs:  Reviewed and stable  Last Vitals:  Vitals:   08/23/19 0551 08/23/19 0713  BP: 120/81 (!) 173/111  Pulse: 97 97  Resp: 18 16  Temp: 37.1 C 37.2 C  SpO2: 97% 97%    Complications: No apparent anesthesia complications

## 2019-08-23 NOTE — Interval H&P Note (Signed)
History and Physical Interval Note:  08/23/2019 7:34 AM  Monique Holland  has presented today for surgery, with the diagnosis of pseudocysts.  The various methods of treatment have been discussed with the patient and family. After consideration of risks, benefits and other options for treatment, the patient has consented to  Procedure(s) with comments: FULL UPPER ENDOSCOPIC ULTRASOUND (EUS) RADIAL (N/A) - axios stent as a surgical intervention.  The patient's history has been reviewed, patient examined, no change in status, stable for surgery.  I have reviewed the patient's chart and labs.  Questions were answered to the patient's satisfaction.     [employer]

## 2019-08-23 NOTE — Anesthesia Postprocedure Evaluation (Signed)
Anesthesia Post Note  Patient: Monique Holland  Procedure(s) Performed: ESOPHAGOGASTRODUODENOSCOPY (EGD) WITH PROPOFOL (N/A ) UPPER ENDOSCOPIC ULTRASOUND (EUS) LINEAR BIOPSY PANCREATIC STENT PLACEMENT CYST GASTROSTOMY BALLOON DILATION (N/A )     Patient location during evaluation: PACU Anesthesia Type: General Level of consciousness: awake and alert Pain management: pain level controlled Vital Signs Assessment: post-procedure vital signs reviewed and stable Respiratory status: spontaneous breathing, nonlabored ventilation, respiratory function stable and patient connected to nasal cannula oxygen Cardiovascular status: blood pressure returned to baseline and stable Postop Assessment: no apparent nausea or vomiting Anesthetic complications: no    Last Vitals:  Vitals:   08/23/19 0930 08/23/19 0940  BP: (!) 147/81 (!) 147/93  Pulse: 92 92  Resp: 12 14  Temp:  36.8 C  SpO2: 100% 100%    Last Pain:  Vitals:   08/23/19 0940  TempSrc: Oral  PainSc: 0-No pain                 Shelton Silvas

## 2019-08-23 NOTE — Anesthesia Procedure Notes (Signed)
Procedure Name: Intubation Date/Time: 08/23/2019 7:54 AM Performed by: Anne Fu, CRNA Pre-anesthesia Checklist: Patient identified, Emergency Drugs available, Suction available, Patient being monitored and Timeout performed Patient Re-evaluated:Patient Re-evaluated prior to induction Oxygen Delivery Method: Circle system utilized Preoxygenation: Pre-oxygenation with 100% oxygen Induction Type: IV induction Ventilation: Mask ventilation without difficulty Laryngoscope Size: Mac and 4 Grade View: Grade I Tube type: Oral Tube size: 7.5 mm Number of attempts: 1 Airway Equipment and Method: Stylet Placement Confirmation: ETT inserted through vocal cords under direct vision,  positive ETCO2 and breath sounds checked- equal and bilateral Secured at: 22 cm Tube secured with: Tape Dental Injury: Teeth and Oropharynx as per pre-operative assessment

## 2019-08-23 NOTE — Anesthesia Preprocedure Evaluation (Signed)
Anesthesia Evaluation  Patient identified by MRN, date of birth, ID band Patient awake    Reviewed: Allergy & Precautions, NPO status , Patient's Chart, lab work & pertinent test results  Airway Mallampati: II  TM Distance: >3 FB Neck ROM: Full    Dental  (+) Teeth Intact, Dental Advisory Given   Pulmonary asthma , sleep apnea ,    breath sounds clear to auscultation       Cardiovascular hypertension,  Rhythm:Regular Rate:Normal     Neuro/Psych PSYCHIATRIC DISORDERS Anxiety Depression Schizophrenia negative neurological ROS     GI/Hepatic Neg liver ROS, GERD  ,  Endo/Other  diabetes  Renal/GU      Musculoskeletal negative musculoskeletal ROS (+)   Abdominal Normal abdominal exam  (+)   Peds  Hematology negative hematology ROS (+)   Anesthesia Other Findings   Reproductive/Obstetrics                             Lab Results  Component Value Date   WBC 5.0 08/23/2019   HGB 9.3 (L) 08/23/2019   HCT 32.7 (L) 08/23/2019   MCV 77.3 (L) 08/23/2019   PLT 218 08/23/2019   Lab Results  Component Value Date   CREATININE 0.70 08/23/2019   BUN 5 (L) 08/23/2019   NA 139 08/23/2019   K 3.8 08/23/2019   CL 106 08/23/2019   CO2 25 08/23/2019   Lab Results  Component Value Date   INR 0.9 08/20/2019   INR 1.0 07/17/2019     Anesthesia Physical  Anesthesia Plan  ASA: III  Anesthesia Plan: General   Post-op Pain Management:    Induction: Intravenous  PONV Risk Score and Plan: 3 and Ondansetron, Dexamethasone and Midazolam  Airway Management Planned: Oral ETT  Additional Equipment: None  Intra-op Plan:   Post-operative Plan: Extubation in OR  Informed Consent: I have reviewed the patients History and Physical, chart, labs and discussed the procedure including the risks, benefits and alternatives for the proposed anesthesia with the patient or authorized representative who has  indicated his/her understanding and acceptance.     Dental advisory given  Plan Discussed with: CRNA  Anesthesia Plan Comments:         Anesthesia Quick Evaluation

## 2019-08-23 NOTE — Progress Notes (Signed)
Patient at this time has a current PIV, however IV is close to the [facility] area. The patient is stating she is having trouble keeping her arm straight. Upon assessment the patients veins are deep past 2cm's and or infiltrated from previous IV sites. At this time the recommendation would be to keep the current PIV and if further access is needed, possible PICC or Central line placement. RN Victorino Dike made aware via telephone/secure chat

## 2019-08-23 NOTE — Progress Notes (Signed)
Discussed case with patient and patient's husband and with son. No evidence of pseudoaneurysm or overt arterial/venous flow within the cyst itself. On EUS did not see vessels that would preclude stent placement at the area of advancement of the stent/sheath, so I believe that an attempt is reasonable for [place] to try and drain the cyst. Will review again via EUS. Suspect that there is a decent amount of necrosis present, so will need to decide based on what we see after potential cystgastrostomy, the need for necrosectomy. My hope is the abdominal pain, nausea, vomiting, pressure that she has been experieincing on/off for weeks will have symptomatic improvement. Will need to continue to monitor very closely however in regards to risk of cystgastrostomy creation - perforation, bleeding, infection, aspiration, medicine effects. Will attempt to send fluid for culture should cystgastrostomy be successful as well. Likely will need to remain on antibiotics until necrosis improved or if only small amount. Further recommendations after EUS.  Monique Parish, MD [facility] Office # [phone]

## 2019-08-23 NOTE — Progress Notes (Signed)
PROGRESS NOTE    Monique Holland    Code Status: Full Code  ZOX:[id number] DOB: August 06, 1965 DOA: 08/16/2019 LOS: 6 days  PCP: Knox RoyaltyJones, Enrico, MD CC:  Chief Complaint  Patient presents with  . Nausea  . Emesis       Hospital Summary   54 year old with past medical history significant for ID DM2, hypertension, gastritis, chronic pancreatitis with pseudocyst, morbid obesity who presents complaining of abdominal pain and vomiting a small amount of blood.  -Patient last hospitalization from 08/02/2019 130 07/1618 21 for epigastric abdominal pain nausea vomiting thought to be secondary to gastritis esophagitis.  Renal admission she had an MRCP which showed 12.6 cm pseudocyst, unchanged from recent CT scan. 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  Patient admitted with recurrent vomiting.  Underwent endoscopy which was normal except for Mild to moderate extrinsic compression in the region of duodenal bulb from Pancreatic pseudocyst.    3/31: EGD/EUS with Dr. Meridee ScoreMansouraty -acquired extrinsic deformity and posterior wall of stomach, erythematous mucosa of gastric body and antrum.  85 x 84 mm cyst walled off necrosis in peripancreatic region consistent with pancreatic pseudocyst and necrosis with 1 enlarged lymph node.  Further imaging ordered by GI for further evaluation to evaluate for potential pseudoaneurysm - no pseudoaneurysm on imaging  4/2: Repeat EUS - cystic lesion in peripancreatic region consistent with pseudocyst with walled off necrosis. Cystogastrostomy created. Started on Cipro  A & P   Active Problems:   AKI (acute kidney injury) (HCC)   Type 2 diabetes mellitus (HCC)   Schizophrenia (HCC)   Obesity, Class III, BMI 40-49.9 (morbid obesity) (HCC)   Acute  pancreatitis   Pancreatic pseudocyst   Abdominal pain   Essential hypertension   Non-intractable vomiting   Hematemesis with nausea   1. Acute on chronic abdominal pain, chronic pancreatitis, pancreatic pseudocyst, persistent vomiting a. S/p EGD/EUS 3/31:acquired extrinsic deformity and posterior wall of stomach, erythematous mucosa of gastric body and antrum.  85 x 84 mm cyst walled off necrosis in peripancreatic region consistent with pancreatic pseudocyst and necrosis with 1 enlarged lymph node.   b. CTA abdomen ordered by GI for further evaluation to evaluate for potential pseudoaneurysm - no pseudoaneurysm noted. c. Repeat EUS 4/2: cystic lesion in peripancreatic region consistent with pseudocyst with walled off necrosis. Cystogastrostomy created.  d. Per GI: Started on Cipro, PPI discontinued to allow for acid production, follow up cultures. Advance diet from CLD to soft if tolerated, plan for Necrosectomy on 4/5 as [facility]. Consider EGD if concern for bleed.   2. Incidental Splenic Vein Occlusion Noted on CT angio a. Discussed with vascular surgery.  Patient's abdominal pain is likely from pseudocyst and chronic pancreatitis and splenic vein occlusion is likely incidental asymptomatic finding.  Will hold off on anticoagulation at this time however if patient becomes symptomatic in the future and/or there is interval progression with recommend starting anticoagulation at that time  3. Hematemesis a. No recurrence b. Plan as above  4. Diarrhea, resolved  5. Hypokalemia, replete   6. AKI, resolved  7. Type 2 diabetes Increase Lantus to 15 units daily, NovoLog 4 units 3 times daily with meals and sliding scale  8. Asthma, stable on room air  9. Hypertension a. Change Lopressor to Coreg and continue amlodipine  10. Anxiety/depression/  Schizophrenia BPD  a. Continue trazodone, amitriptyline, Latuda  DVT prophylaxis: SCDs Family Communication: discussed with husband on  bedside phone Disposition Plan:   Patient came from:   Home                                                                                          Anticipated d/c place: Home  Barriers to d/c: Pending further GI recommendations. Several days away from DC  Pressure injury documentation    None  Consultants  GI Discussed findings of CTA abdomen with Vascular surgery   Procedures  3/31: EGD/EUS  Antibiotics   Anti-infectives (From admission, onward)   Start     Dose/Rate Route Frequency Ordered Stop   08/23/19 1800  ciprofloxacin (CIPRO) IVPB 400 mg  Status:  Discontinued     400 mg 200 mL/hr over 60 Minutes Intravenous Every 12 hours 08/23/19 0952 08/23/19 1146   08/23/19 1400  piperacillin-tazobactam (ZOSYN) IVPB 3.375 g     3.375 g 12.5 mL/hr over 240 Minutes Intravenous Every 8 hours 08/23/19 1147          Subjective   Seen post procedure today. Patient denies complaints and feels well post procedure. No complaints, no overnight events.  Objective   Vitals:   08/23/19 0930 08/23/19 0940 08/23/19 1017 08/23/19 1421  BP: (!) 147/81 (!) 147/93 (!) 149/105 (!) 162/119  Pulse: 92 92 99 (!) 103  Resp: 12 14 18 16   Temp:  98.3 F (36.8 C) 98.6 F (37 C) 98.6 F (37 C)  TempSrc:  Oral Oral Oral  SpO2: 100% 100% 100% 100%  Weight:      Height:        Intake/Output Summary (Last 24 hours) at 08/23/2019 1517 Last data filed at 08/23/2019 1502 Gross per 24 hour  Intake 3270.63 ml  Output 0 ml  Net 3270.63 ml   Filed Weights   08/18/19 1059 08/21/19 1154 08/23/19 0713  Weight: 135.6 kg 135.6 kg 135.6 kg    Examination:  Physical Exam Vitals and nursing note reviewed.  Constitutional:      Appearance: Normal appearance.  HENT:     Head: Normocephalic and atraumatic.  Eyes:     Conjunctiva/sclera: Conjunctivae normal.  Cardiovascular:     Rate and Rhythm: Normal rate and regular rhythm.  Pulmonary:     Effort: Pulmonary effort is normal.     Breath  sounds: Normal breath sounds.  Abdominal:     General: Abdomen is flat.     Palpations: Abdomen is soft.     Tenderness: There is abdominal tenderness in the epigastric area.  Musculoskeletal:        General: No swelling or tenderness.  Skin:    Coloration: Skin is not jaundiced or pale.  Neurological:     Mental Status: She is alert. Mental status is at baseline.  Psychiatric:        Mood and Affect: Mood normal.        Behavior: Behavior normal.     Data Reviewed: I have personally reviewed following labs and imaging studies  CBC: Recent Labs  Lab 08/16/19 1920 08/17/19 0522  08/18/19 1230 08/20/19 0031 08/21/19 0436 08/22/19 0422 08/23/19 0426  WBC 7.9   < > 5.7 4.7 5.2 5.4 5.0  NEUTROABS 4.1  --   --   --   --   --   --   HGB 10.5*   < > 10.4* 10.4* 9.7* 9.6* 9.3*  HCT 36.0   < > 36.4 36.3 33.5* 33.9* 32.7*  MCV 78.9*   < > 77.6* 78.2* 77.5* 78.1* 77.3*  PLT 297   < > 249 243 216 221 218   < > = values in this interval not displayed.   Basic Metabolic Panel: Recent Labs  Lab 08/18/19 0453 08/20/19 0031 08/20/19 0823 08/21/19 0436 08/22/19 0422 08/23/19 0426  NA 134*  --  137 140 138 139  K 3.7  --  3.6 3.5 3.4* 3.8  CL 102  --  104 107 106 106  CO2 21*  --  21* 24 24 25   GLUCOSE 227*  --  301* 284* 191* 233*  BUN 6  --  7 8 6  5*  CREATININE 0.77  --  0.77 0.71 0.61 0.70  CALCIUM 8.4*  --  8.8* 8.7* 8.5* 8.6*  MG  --  1.7  --   --   --   --   PHOS  --  3.6  --   --   --   --    GFR: Estimated Creatinine Clearance: 120.3 mL/min (by C-G formula based on SCr of 0.7 mg/dL). Liver Function Tests: Recent Labs  Lab 08/16/19 1920 08/17/19 0522 08/20/19 0031 08/22/19 0422  AST 34 28 27 17   ALT 25 23 18 17   ALKPHOS 77 71 64 56  BILITOT 0.7 0.6 0.3 0.5  PROT 7.0 6.4* 6.9 6.1*  ALBUMIN 3.7 3.4* 3.6 3.1*   Recent Labs  Lab 08/16/19 1920  LIPASE 35   No results for input(s): AMMONIA in the last 168 hours. Coagulation Profile: Recent Labs  Lab  08/20/19 0031  INR 0.9   Cardiac Enzymes: No results for input(s): CKTOTAL, CKMB, CKMBINDEX, TROPONINI in the last 168 hours. BNP (last 3 results) No results for input(s): PROBNP in the last 8760 hours. HbA1C: No results for input(s): HGBA1C in the last 72 hours. CBG: Recent Labs  Lab 08/22/19 1643 08/22/19 2130 08/23/19 0712 08/23/19 0931 08/23/19 1211  GLUCAP 285* 276* 193* 201* 239*   Lipid Profile: No results for input(s): CHOL, HDL, LDLCALC, TRIG, CHOLHDL, LDLDIRECT in the last 72 hours. Thyroid Function Tests: No results for input(s): TSH, T4TOTAL, FREET4, T3FREE, THYROIDAB in the last 72 hours. Anemia Panel: No results for input(s): VITAMINB12, FOLATE, FERRITIN, TIBC, IRON, RETICCTPCT in the last 72 hours. Sepsis Labs: No results for input(s): PROCALCITON, LATICACIDVEN in the last 168 hours.  Recent Results (from the past 240 hour(s))  SARS CORONAVIRUS 2 (TAT 6-24 HRS) Nasopharyngeal Nasopharyngeal Swab     Status: None   Collection Time: 08/17/19  2:10 AM   Specimen: Nasopharyngeal Swab  Result Value Ref Range Status   SARS Coronavirus 2 NEGATIVE NEGATIVE Final    Comment: (NOTE) SARS-CoV-2 target nucleic acids are NOT DETECTED. The SARS-CoV-2 RNA is generally detectable in upper and lower respiratory specimens during the acute phase of infection. Negative results do not preclude SARS-CoV-2 infection, do not rule out co-infections with other pathogens, and should not be used as the sole basis for treatment or other patient management decisions. Negative results must be combined with clinical observations, patient history, and epidemiological information. The expected result  is Negative. Fact Sheet for Patients: HairSlick.no Fact Sheet for Healthcare Providers: quierodirigir.com This test is not yet approved or cleared by the [place] FDA and  has been authorized for detection and/or diagnosis of  SARS-CoV-2 by FDA under an Emergency Use Authorization (EUA). This EUA will remain  in effect (meaning this test can be used) for the duration of the COVID-19 declaration under Section 56 4(b)(1) of the Act, 21 U.S.C. section 360bbb-3(b)(1), unless the authorization is terminated or revoked sooner. Performed at [facility], [address] [place], [place] [address]   Aerobic/Anaerobic Culture (surgical/deep wound)     Status: None (Preliminary result)   Collection Time: 08/23/19  8:11 AM   Specimen: PATH GI biopsy; Tissue  Result Value Ref Range Status   Specimen Description   Final    CYSTS PANCREATIC PSEUDOCYST ASPIRATE Performed at [facility], [address] [place], [place] [address]    Special Requests   Final    NONE Performed at [facility], [address] [place], [place] [address]    Gram Stain   Final    NO WBC SEEN NO ORGANISMS SEEN Performed at [facility], [address], [place], [place] [address]    Culture PENDING  Incomplete   Report Status PENDING  Incomplete         Radiology Studies: CT ANGIO ABDOMEN W &/OR WO CONTRAST  Result Date: 08/21/2019 CLINICAL DATA:  Pancreatic pseudocyst with concern for arterial flow seen on recent EUS. Concern for pseudoaneurysm. EXAM: CT ANGIOGRAPHY OF ABDOMINAL AORTA TECHNIQUE: Multidetector CT imaging of the abdomen was performed using the standard protocol during bolus administration of intravenous contrast. CONTRAST:  OMNIPAQUE IOHEXOL 350 MG/ML SOLN COMPARISON:  August 16, 2019 FINDINGS: VASCULAR Evaluation of the arterial vasculature it was severely limited by suboptimal contrast bolus timing. Aorta: Normal caliber aorta without aneurysm, dissection, vasculitis or significant stenosis. Celiac: Patent without evidence of aneurysm, dissection, vasculitis or significant stenosis. SMA: Patent without evidence of aneurysm, dissection, vasculitis or significant  stenosis. Renals: Both renal arteries are patent without evidence of aneurysm, dissection, vasculitis, fibromuscular dysplasia or significant stenosis. IMA: Patent without evidence of aneurysm, dissection, vasculitis or significant stenosis. Veins: The portal vein is patent. The SMV is patent. The splenic vein is significantly attenuated at the level of the pancreatic body and dominant pancreatic fluid collection (axial series 5, image 49). The distal pancreatic vein is difficult to follow and is felt to be occluded given the presence of portal systemic collaterals coursing through the patient's anterior abdomen. There is no definite CT evidence for an aneurysm or pseudoaneurysm. NON-VASCULAR Lower chest: The lung bases are clear. The heart size is normal. Hepatobiliary: There is decreased hepatic attenuation suggestive of hepatic steatosis. Normal gallbladder.There is no biliary ductal dilation. Pancreas: Again noted are multiple peripancreatic in intrahepatic fluid collections/pseudocyst. Currently, the dominant collection measures approximately 12.1 by 7 cm (previously measuring 12.3 x 6.7 cm). The pancreatic tail collection measures approximately 4.4 by 5.4 cm (previously measuring 5.9 x 5.2 cm. Peripancreatic inflammatory changes are again noted. Pancreatic calcifications are again noted in the pancreatic head. Spleen: The spleen is enlarged measuring approximately 15.4 cm craniocaudad. This is stable in size from July 16, 2018. Adrenals/Urinary Tract: --Adrenal glands: No adrenal hemorrhage. --Right kidney/ureter: Nonobstructing right-sided nephrolithiasis is noted. --Left kidney/ureter: Nonobstructing left-sided nephrolithiasis is noted. --Urinary bladder: Outside the field of view. Stomach/Bowel: --Stomach/Duodenum: There is some wall thickening of the gastric body and  fundus which is likely reactive from the nearby pancreatic collections. --Small bowel: The distal duodenum is mildly dilated without  evidence for high-grade ileus or small bowel obstruction. --Colon: There is some wall thickening of the splenic flexure of the colon which is likely reactive. The remaining portions of the colon are unremarkable. --Appendix: Outside the field of view. Lymphatic: --No retroperitoneal lymphadenopathy. --No mesenteric lymphadenopathy. Other: No ascites or free air. The abdominal wall is normal. Musculoskeletal. 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 There is no definite evidence for an acute displaced fracture. IMPRESSION: 1. Evaluation of the arterial structures is significantly limited by suboptimal contrast bolus timing in the patient's habitus. Given these limitations, no pseudoaneurysm was detected. 2. 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 Portosystemic collaterals are noted. 3. The portal vein and SMV remain patent. The splenic artery is patent. 4. Stable pancreatic fluid collections as previously described. 5. Bilateral nonobstructing nephrolithiasis. 6. Wall thickening of the splenic flexure of the colon, likely reactive. There is mild dilatation of the distal duodenum and proximal jejunum likely representing a low-grade ileus. 7. Hepatic steatosis. Electronically Signed   By: Katherine Mantle M.D.   On: 08/21/2019 21:47        Scheduled Meds: . amitriptyline  75 mg Oral QHS  . amLODipine  10 mg Oral Daily  . carvedilol  6.25 mg Oral BID WC  . insulin aspart  0-15 Units Subcutaneous TID WC  . insulin aspart  0-5 Units Subcutaneous QHS  . insulin aspart  2 Units Subcutaneous TID WC  . insulin glargine  10 Units Subcutaneous Daily  . lamoTRIgine  200 mg Oral Daily  . lurasidone  60 mg Oral QHS  . polyethylene glycol  17 g Oral BID  . traZODone  100 mg Oral QHS   Continuous Infusions: . sodium chloride 100 mL/hr at 08/23/19 1502  . piperacillin-tazobactam (ZOSYN)  IV 12.5 mL/hr at 08/23/19 1502     Time spent: 25 minutes  with over 50% of the time coordinating the patient's care    Jae Dire, DO [facility] Pager [phone]  Call night coverage person covering after 7pm

## 2019-08-23 NOTE — Progress Notes (Signed)
Report given to [facility] for Endoscopic ultrasound procedure that patient will be going to have performed. Patient is currently stable, will be transferred to endo per wheelchair. Marcelle Overlie, RN

## 2019-08-24 LAB — CBC
HCT: 34 % — ABNORMAL LOW (ref 36.0–46.0)
Hemoglobin: 10 g/dL — ABNORMAL LOW (ref 12.0–15.0)
MCH: 22.7 pg — ABNORMAL LOW (ref 26.0–34.0)
MCHC: 29.4 g/dL — ABNORMAL LOW (ref 30.0–36.0)
MCV: 77.3 fL — ABNORMAL LOW (ref 80.0–100.0)
Platelets: 240 10*3/uL (ref 150–400)
RBC: 4.4 MIL/uL (ref 3.87–5.11)
RDW: 15.3 % (ref 11.5–15.5)
WBC: 7.7 10*3/uL (ref 4.0–10.5)
nRBC: 0 % (ref 0.0–0.2)

## 2019-08-24 LAB — COMPREHENSIVE METABOLIC PANEL
ALT: 15 U/L (ref 0–44)
AST: 18 U/L (ref 15–41)
Albumin: 3.3 g/dL — ABNORMAL LOW (ref 3.5–5.0)
Alkaline Phosphatase: 61 U/L (ref 38–126)
Anion gap: 8 (ref 5–15)
BUN: <5 mg/dL — ABNORMAL LOW (ref 6–20)
CO2: 26 mmol/L (ref 22–32)
Calcium: 8.6 mg/dL — ABNORMAL LOW (ref 8.9–10.3)
Chloride: 103 mmol/L (ref 98–111)
Creatinine, Ser: 0.67 mg/dL (ref 0.44–1.00)
GFR calc Af Amer: >60 mL/min (ref >60)
GFR calc non Af Amer: >60 mL/min (ref >60)
Glucose, Bld: 253 mg/dL — ABNORMAL HIGH (ref 70–99)
Potassium: 3.6 mmol/L (ref 3.5–5.1)
Sodium: 137 mmol/L (ref 135–145)
Total Bilirubin: 0.4 mg/dL (ref 0.3–1.2)
Total Protein: 6.2 g/dL — ABNORMAL LOW (ref 6.5–8.1)

## 2019-08-24 LAB — GLUCOSE, CAPILLARY
Glucose-Capillary: 219 mg/dL — ABNORMAL HIGH (ref 70–99)
Glucose-Capillary: 222 mg/dL — ABNORMAL HIGH (ref 70–99)
Glucose-Capillary: 178 mg/dL — ABNORMAL HIGH (ref 70–99)
Glucose-Capillary: 307 mg/dL — ABNORMAL HIGH (ref 70–99)

## 2019-08-24 MED ORDER — CIPROFLOXACIN IN D5W 400 MG/200ML IV SOLN
400.0000 mg | Freq: Two times a day (BID) | INTRAVENOUS | Status: DC
  Administered 2019-08-24 – 2019-08-27 (×7): 400 mg via INTRAVENOUS
  Filled 2019-08-24 (×7): qty 200

## 2019-08-24 MED ORDER — INSULIN ASPART 100 UNIT/ML ~~LOC~~ SOLN
6.0000 [IU] | Freq: Three times a day (TID) | SUBCUTANEOUS | Status: DC
  Administered 2019-08-24 (×2): 6 [IU] via SUBCUTANEOUS

## 2019-08-24 MED ORDER — GLUCERNA SHAKE PO LIQD
237.0000 mL | Freq: Three times a day (TID) | ORAL | Status: DC
  Administered 2019-08-24 – 2019-08-27 (×2): 237 mL via ORAL
  Filled 2019-08-24 (×10): qty 237

## 2019-08-24 MED ORDER — CARVEDILOL 12.5 MG PO TABS
12.5000 mg | ORAL_TABLET | Freq: Two times a day (BID) | ORAL | Status: DC
  Administered 2019-08-24 – 2019-08-27 (×6): 12.5 mg via ORAL
  Filled 2019-08-24 (×6): qty 1

## 2019-08-24 NOTE — Progress Notes (Signed)
Latest BP: 172/102. Gave oral Hydralazine 25 mg @ 2212. Paged Dr. Esaw Grandchild, DO to make aware of BP and my intervention (Hydralazine 25 mg, oral per MD order). No new orders at this time. Delegated to nurse tech to re-check BP in 1 hour.

## 2019-08-24 NOTE — Progress Notes (Signed)
Nutrition Follow-up  DOCUMENTATION CODES:   Morbid obesity  INTERVENTION:  Glucerna Shake po TID, each supplement provides 220 kcal and 10 grams of protein  NUTRITION DIAGNOSIS:   Increased nutrient needs related to chronic illness(chronic pancreatitis with pseudocyst) as evidenced by estimated needs. Ongoing  GOAL:   Patient will meet greater than or equal to 90% of their needs Progressing    MONITOR:   Diet advancement, Labs, Weight trends, Skin, I & O's  REASON FOR ASSESSMENT:   Malnutrition Screening Tool    ASSESSMENT:  RD working remotely.  Monique Holland is a 54 y.o. female with hx of IDDM2, HTN, gastritis, chronic pancreatitis w pseudocyst, morbid obesity, who presents with abdominal pain.  3/27- Admit 3/31 - EGD/EUS: 85 x 84 mm cyst walled off necrosis, pancreatic pseudocyst, enlarged lymph node 4/2 - repeat EUS: pseudocyst with walled off necrosis, cystogastrostomy created, started on Cipro  Patient has been eating 100% of most meals during admission, noted 0-20% of breakfast and lunch meals on 4/1. Diet advanced to soft yesterday after procedure and patient endorses appetite and feeling hungry today.   Per notes: -planned necrosectomy on 4/5 at [facility]  Admit wt 305 lbs      Current wt 299 lbs  Medications reviewed and include: SSI, Lantus IVPB: Cipro Labs: CBGs 178,222,219,168 x 24 hrs  Diet Order:   Diet Order            DIET SOFT Room service appropriate? Yes; Fluid consistency: Thin  Diet effective now        Diet - low sodium heart healthy              EDUCATION NEEDS:   No education needs have been identified at this time  Skin:  Skin Assessment: Reviewed RN Assessment  Last BM:  4/3  Height:   Ht Readings from Last 1 Encounters:  08/23/19 5\' 8"  (1.727 m)    Weight:   Wt Readings from Last 1 Encounters:  08/23/19 135.6 kg    Ideal Body Weight:  65.9 kg  BMI:  Body mass index is 45.45 kg/m.  Estimated Nutritional Needs:    Kcal:  2100-2300  Protein:  115-130 grams  Fluid:  > 2.1 L   10/23/19, RD, LDN Clinical Nutrition After Hours/Weekend Pager # in Amion

## 2019-08-24 NOTE — H&P (View-Only) (Signed)
   Patient Name: Monique Holland Date of Encounter: 08/24/2019, 6:51 PM    Subjective  Feels better - able to eat with less discomfort No vomiting   Objective  BP (!) 162/99 (BP Location: Left Arm)   Pulse (!) 106   Temp 97.9 F (36.6 C)   Resp 16   Ht 5' 8" (1.727 m)   Wt 135.6 kg   LMP 03/23/2014   SpO2 97%   BMI 45.45 kg/m   Obese NAD     Assessment and Plan  Pancreatic pseudocyst + walled-off necrosis s/p endoscopic cystgastrostomy 4/2  Doing well after this  Plan for necrosectomy 4/5 by Dr. Mansouraty - at [facility] (to be transported for that  Appreciate TRH help  Maeci Kalbfleisch E. Jayse Hodkinson, MD, FACG [facility] 08/24/2019 6:51 PM   

## 2019-08-24 NOTE — Plan of Care (Signed)
  Problem: Clinical Measurements: Goal: Respiratory complications will improve Outcome: Progressing   Problem: Clinical Measurements: Goal: Cardiovascular complication will be avoided Outcome: Progressing   Problem: Coping: Goal: Level of anxiety will decrease Outcome: Progressing   Problem: Nutrition: Goal: Adequate nutrition will be maintained Outcome: Progressing   Problem: Pain Managment: Goal: General experience of comfort will improve Outcome: Progressing   

## 2019-08-24 NOTE — Progress Notes (Signed)
   Patient Name: Monique Holland Date of Encounter: 08/24/2019, 6:51 PM    Subjective  Feels better - able to eat with less discomfort No vomiting   Objective  BP (!) 162/99 (BP Location: Left Arm)   Pulse (!) 106   Temp 97.9 F (36.6 C)   Resp 16   Ht 5\' 8"  (1.727 m)   Wt 135.6 kg   LMP 03/23/2014   SpO2 97%   BMI 45.45 kg/m   Obese NAD     Assessment and Plan  Pancreatic pseudocyst + walled-off necrosis s/p endoscopic cystgastrostomy 4/2  Doing well after this  Plan for necrosectomy 4/5 by Dr. 6/2 - at [facility] (to be transported for that  Appreciate TRH help  [facility], MD, [facility] 08/24/2019 6:51 PM

## 2019-08-24 NOTE — Progress Notes (Signed)
PROGRESS NOTE    Monique Holland    Code Status: Full Code  QZR:[id number] DOB: July 18, 1965 DOA: 08/16/2019 LOS: 7 days  PCP: Knox Royalty, MD CC:  Chief Complaint  Patient presents with  . Nausea  . Emesis       Hospital Summary   54 year old with past medical history significant for ID DM2, hypertension, gastritis, chronic pancreatitis with pseudocyst, morbid obesity who presents complaining of abdominal pain and vomiting a small amount of blood.  -Patient last hospitalization from 08/02/2019 130 07/1618 21 for epigastric abdominal pain nausea vomiting thought to be secondary to gastritis esophagitis.  Renal admission she had an MRCP which showed 12.6 cm pseudocyst, unchanged from recent CT scan. 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  Patient admitted with recurrent vomiting.  Underwent endoscopy which was normal except for Mild to moderate extrinsic compression in the region of duodenal bulb from Pancreatic pseudocyst.    3/31: EGD/EUS with Dr. Meridee Score -acquired extrinsic deformity and posterior wall of stomach, erythematous mucosa of gastric body and antrum.  85 x 84 mm cyst walled off necrosis in peripancreatic region consistent with pancreatic pseudocyst and necrosis with 1 enlarged lymph node.  Further imaging ordered by GI for further evaluation to evaluate for potential pseudoaneurysm - no pseudoaneurysm on imaging  4/2: Repeat EUS - cystic lesion in peripancreatic region consistent with pseudocyst with walled off necrosis. Cystogastrostomy created. Started on Cipro  A & P   Active Problems:   AKI (acute kidney injury) (HCC)   Type 2 diabetes mellitus (HCC)   Schizophrenia (HCC)   Obesity, Class III, BMI 40-49.9 (morbid obesity) (HCC)   Acute  pancreatitis   Pancreatic pseudocyst   Abdominal pain   Essential hypertension   Non-intractable vomiting   Hematemesis with nausea   1. Acute on chronic abdominal pain, chronic pancreatitis, pancreatic pseudocyst, persistent vomiting a. S/p EGD/EUS 3/31:acquired extrinsic deformity and posterior wall of stomach, erythematous mucosa of gastric body and antrum.  85 x 84 mm cyst walled off necrosis in peripancreatic region consistent with pancreatic pseudocyst and necrosis with 1 enlarged lymph node.   b. CTA abdomen ordered by GI for further evaluation to evaluate for potential pseudoaneurysm - no pseudoaneurysm noted. c. Repeat EUS 4/2: cystic lesion in peripancreatic region consistent with pseudocyst with walled off necrosis. Cystogastrostomy created.  d. Per GI 4/2: Start on Cipro, PPI discontinued to allow for acid production, follow up cultures. Advance diet from CLD to soft if tolerated, plan for Necrosectomy on 4/5 as a day trip to [facility]. Consider EGD if concern for bleed.   2. Incidental Splenic Vein Occlusion Noted on CT angio a. Discussed with vascular surgery.  Patient's abdominal pain is likely from pseudocyst and chronic pancreatitis and splenic vein occlusion is likely incidental asymptomatic finding.  Will hold off on anticoagulation at this time however if patient becomes symptomatic in the future and/or there is interval progression with recommend starting anticoagulation at that time  3. Hematemesis a. No recurrence b. Plan as above  4. Diarrhea, resolved  5. Hypokalemia, replete   6. AKI, resolved  7. Type 2 diabetes Increase Lantus to 15 units daily, NovoLog 6 units 3 times daily with meals and sliding scale  8. Asthma, stable on room air  9. Hypertension a. Change Lopressor to Coreg and continue amlodipine  10.  Anxiety/depression/ Schizophrenia BPD  a. Continue trazodone, amitriptyline, Latuda  DVT prophylaxis: SCDs Family Communication: discussed with husband  on bedside phone yesterday Disposition Plan:   Patient came from:   Home                                                                                          Anticipated d/c place: Home  Barriers to d/c: Pending further GI recommendations. Several days away from DC  Pressure injury documentation    None  Consultants  GI Discussed findings of CTA abdomen with Vascular surgery   Procedures  3/31: EGD/EUS  Antibiotics   Anti-infectives (From admission, onward)   Start     Dose/Rate Route Frequency Ordered Stop   08/24/19 0800  ciprofloxacin (CIPRO) IVPB 400 mg     400 mg 200 mL/hr over 60 Minutes Intravenous Every 12 hours 08/24/19 0732     08/23/19 1800  ciprofloxacin (CIPRO) IVPB 400 mg  Status:  Discontinued     400 mg 200 mL/hr over 60 Minutes Intravenous Every 12 hours 08/23/19 0952 08/23/19 1146   08/23/19 1400  piperacillin-tazobactam (ZOSYN) IVPB 3.375 g  Status:  Discontinued     3.375 g 12.5 mL/hr over 240 Minutes Intravenous Every 8 hours 08/23/19 1147 08/24/19 0720        Subjective   Reports that she has an appetite and is hungry today. Denies any complaints, no overnight events.   Objective   Vitals:   08/23/19 1421 08/23/19 2151 08/23/19 2328 08/24/19 0534  BP: (!) 162/119 (!) 172/102 (!) 150/95 (!) 146/94  Pulse: (!) 103 (!) 104 (!) 107 (!) 110  Resp: 16 14  16   Temp: 98.6 F (37 C) 98.2 F (36.8 C)  98.7 F (37.1 C)  TempSrc: Oral Oral    SpO2: 100% 98% 98% 95%  Weight:      Height:        Intake/Output Summary (Last 24 hours) at 08/24/2019 1122 Last data filed at 08/24/2019 0923 Gross per 24 hour  Intake 3406.99 ml  Output --  Net 3406.99 ml   Filed Weights   08/18/19 1059 08/21/19 1154 08/23/19 0713  Weight: 135.6 kg 135.6 kg 135.6 kg    Examination:  Physical Exam Vitals and nursing note reviewed.  Constitutional:      Appearance: Normal appearance.  HENT:     Head: Normocephalic and atraumatic.  Eyes:      Conjunctiva/sclera: Conjunctivae normal.  Cardiovascular:     Rate and Rhythm: Normal rate and regular rhythm.  Pulmonary:     Effort: Pulmonary effort is normal.     Breath sounds: Normal breath sounds.  Abdominal:     General: Abdomen is flat.     Palpations: Abdomen is soft.  Musculoskeletal:        General: No swelling or tenderness.  Skin:    Coloration: Skin is not jaundiced or pale.  Neurological:     Mental Status: She is alert. Mental status is at baseline.  Psychiatric:        Mood and Affect: Mood normal.        Behavior: Behavior normal.  Data Reviewed: I have personally reviewed following labs and imaging studies  CBC: Recent Labs  Lab 08/20/19 0031 08/21/19 0436 08/22/19 0422 08/23/19 0426 08/24/19 0446  WBC 4.7 5.2 5.4 5.0 7.7  HGB 10.4* 9.7* 9.6* 9.3* 10.0*  HCT 36.3 33.5* 33.9* 32.7* 34.0*  MCV 78.2* 77.5* 78.1* 77.3* 77.3*  PLT 243 216 221 218 240   Basic Metabolic Panel: Recent Labs  Lab 08/18/19 0453 08/20/19 0031 08/20/19 0823 08/21/19 0436 08/22/19 0422 08/23/19 0426 08/24/19 0446  NA   < >  --  137 140 138 139 137  K   < >  --  3.6 3.5 3.4* 3.8 3.6  CL   < >  --  104 107 106 106 103  CO2   < >  --  21* 24 24 25 26   GLUCOSE   < >  --  301* 284* 191* 233* 253*  BUN   < >  --  7 8 6  5* <5*  CREATININE   < >  --  0.77 0.71 0.61 0.70 0.67  CALCIUM   < >  --  8.8* 8.7* 8.5* 8.6* 8.6*  MG  --  1.7  --   --   --   --   --   PHOS  --  3.6  --   --   --   --   --    < > = values in this interval not displayed.   GFR: Estimated Creatinine Clearance: 120.3 mL/min (by C-G formula based on SCr of 0.67 mg/dL). Liver Function Tests: Recent Labs  Lab 08/20/19 0031 08/22/19 0422 08/24/19 0446  AST 27 17 18   ALT 18 17 15   ALKPHOS 64 56 61  BILITOT 0.3 0.5 0.4  PROT 6.9 6.1* 6.2*  ALBUMIN 3.6 3.1* 3.3*   No results for input(s): LIPASE, AMYLASE in the last 168 hours. No results for input(s): AMMONIA in the last 168 hours. Coagulation  Profile: Recent Labs  Lab 08/20/19 0031  INR 0.9   Cardiac Enzymes: No results for input(s): CKTOTAL, CKMB, CKMBINDEX, TROPONINI in the last 168 hours. BNP (last 3 results) No results for input(s): PROBNP in the last 8760 hours. HbA1C: No results for input(s): HGBA1C in the last 72 hours. CBG: Recent Labs  Lab 08/23/19 0931 08/23/19 1211 08/23/19 1658 08/23/19 2155 08/24/19 0739  GLUCAP 201* 239* 206* 168* 219*   Lipid Profile: No results for input(s): CHOL, HDL, LDLCALC, TRIG, CHOLHDL, LDLDIRECT in the last 72 hours. Thyroid Function Tests: No results for input(s): TSH, T4TOTAL, FREET4, T3FREE, THYROIDAB in the last 72 hours. Anemia Panel: No results for input(s): VITAMINB12, FOLATE, FERRITIN, TIBC, IRON, RETICCTPCT in the last 72 hours. Sepsis Labs: No results for input(s): PROCALCITON, LATICACIDVEN in the last 168 hours.  Recent Results (from the past 240 hour(s))  SARS CORONAVIRUS 2 (TAT 6-24 HRS) Nasopharyngeal Nasopharyngeal Swab     Status: None   Collection Time: 08/17/19  2:10 AM   Specimen: Nasopharyngeal Swab  Result Value Ref Range Status   SARS Coronavirus 2 NEGATIVE NEGATIVE Final    Comment: (NOTE) SARS-CoV-2 target nucleic acids are NOT DETECTED. The SARS-CoV-2 RNA is generally detectable in upper and lower respiratory specimens during the acute phase of infection. Negative results do not preclude SARS-CoV-2 infection, do not rule out co-infections with other pathogens, and should not be used as the sole basis for treatment or other patient management decisions. Negative results must be combined with clinical observations, patient history, and epidemiological information.  The expected result is Negative. Fact Sheet for Patients: SugarRoll.be Fact Sheet for Healthcare Providers: https://www.woods-mathews.com/ This test is not yet approved or cleared by the [place] FDA and  has been authorized for  detection and/or diagnosis of SARS-CoV-2 by FDA under an Emergency Use Authorization (EUA). This EUA will remain  in effect (meaning this test can be used) for the duration of the COVID-19 declaration under Section 56 4(b)(1) of the Act, 21 U.S.C. section 360bbb-3(b)(1), unless the authorization is terminated or revoked sooner. Performed at [facility], [place] [address] [place], [place] [address]   Aerobic/Anaerobic Culture (surgical/deep wound)     Status: None (Preliminary result)   Collection Time: 08/23/19  8:11 AM   Specimen: PATH GI biopsy; Tissue  Result Value Ref Range Status   Specimen Description   Final    CYSTS PANCREATIC PSEUDOCYST ASPIRATE Performed at [facility] [address] [place], [place] [address]    Special Requests   Final    NONE Performed at [facility], [place] [address], [place], [place] [address]    Gram Stain   Final    NO WBC SEEN NO ORGANISMS SEEN Performed at [facility], [place] [address] [place], [place] [address]    Culture PENDING  Incomplete   Report Status PENDING  Incomplete         Radiology Studies: No results found.      Scheduled Meds: . amitriptyline  75 mg Oral QHS  . amLODipine  10 mg Oral Daily  . carvedilol  12.5 mg Oral BID WC  . insulin aspart  0-15 Units Subcutaneous TID WC  . insulin aspart  0-5 Units Subcutaneous QHS  . insulin aspart  4 Units Subcutaneous TID WC  . insulin glargine  15 Units Subcutaneous Daily  . lamoTRIgine  200 mg Oral Daily  . lurasidone  60 mg Oral QHS  . polyethylene glycol  17 g Oral BID  . traZODone  100 mg Oral QHS   Continuous Infusions: . sodium chloride 100 mL/hr at 08/24/19 0923  . sodium chloride 10 mL/hr at 08/23/19 2240  . ciprofloxacin Stopped (08/24/19 0910)     Time spent: 20 minutes with over 50% of the time coordinating the patient's care    Harold Hedge, DO [facility] Pager [phone]  Call night  coverage person covering after 7pm

## 2019-08-25 LAB — GLUCOSE, CAPILLARY
Glucose-Capillary: 239 mg/dL — ABNORMAL HIGH (ref 70–99)
Glucose-Capillary: 278 mg/dL — ABNORMAL HIGH (ref 70–99)
Glucose-Capillary: 248 mg/dL — ABNORMAL HIGH (ref 70–99)
Glucose-Capillary: 250 mg/dL — ABNORMAL HIGH (ref 70–99)

## 2019-08-25 MED ORDER — INSULIN ASPART 100 UNIT/ML ~~LOC~~ SOLN
8.0000 [IU] | Freq: Three times a day (TID) | SUBCUTANEOUS | Status: DC
  Administered 2019-08-25 – 2019-08-27 (×5): 8 [IU] via SUBCUTANEOUS

## 2019-08-25 NOTE — Plan of Care (Signed)
  Problem: Clinical Measurements: Goal: Respiratory complications will improve Outcome: Progressing   Problem: Clinical Measurements: Goal: Cardiovascular complication will be avoided Outcome: Progressing   Problem: Coping: Goal: Level of anxiety will decrease Outcome: Progressing   Problem: Nutrition: Goal: Adequate nutrition will be maintained Outcome: Progressing   Problem: Pain Managment: Goal: General experience of comfort will improve Outcome: Progressing

## 2019-08-25 NOTE — Progress Notes (Signed)
PROGRESS NOTE    Monique Holland    Code Status: Full Code  ZOX:[id number] DOB: 1966/01/27 DOA: 08/16/2019 LOS: 8 days  PCP: Knox Royalty, MD CC:  Chief Complaint  Patient presents with  . Nausea  . Emesis       Hospital Summary   54 year old with past medical history significant for ID DM2, hypertension, gastritis, chronic pancreatitis with pseudocyst, morbid obesity who presents complaining of abdominal pain and vomiting a small amount of blood.  -Patient last hospitalization from 08/02/2019 130 07/1618 21 for epigastric abdominal pain nausea vomiting thought to be secondary to gastritis esophagitis.  Renal admission she had an MRCP which showed 12.6 cm pseudocyst, unchanged from recent CT scan. 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  Patient admitted with recurrent vomiting.  Underwent endoscopy which was normal except for Mild to moderate extrinsic compression in the region of duodenal bulb from Pancreatic pseudocyst.    3/31: EGD/EUS with Dr. Meridee Score -acquired extrinsic deformity and posterior wall of stomach, erythematous mucosa of gastric body and antrum.  85 x 84 mm cyst walled off necrosis in peripancreatic region consistent with pancreatic pseudocyst and necrosis with 1 enlarged lymph node.  Further imaging ordered by GI for further evaluation to evaluate for potential pseudoaneurysm - no pseudoaneurysm on imaging  4/2: Repeat EUS - cystic lesion in peripancreatic region consistent with pseudocyst with walled off necrosis. Cystogastrostomy created. Started on Cipro  A & P   Active Problems:   AKI (acute kidney injury) (HCC)   Type 2 diabetes mellitus (HCC)   Schizophrenia (HCC)   Obesity, Class III, BMI 40-49.9 (morbid obesity) (HCC)   Acute  pancreatitis   Pancreatic pseudocyst   Abdominal pain   Essential hypertension   Non-intractable vomiting   Hematemesis with nausea   1. Acute on chronic abdominal pain, chronic pancreatitis, pancreatic pseudocyst, persistent vomiting a. S/p EGD/EUS 3/31:acquired extrinsic deformity and posterior wall of stomach, erythematous mucosa of gastric body and antrum.  85 x 84 mm cyst walled off necrosis in peripancreatic region consistent with pancreatic pseudocyst and necrosis with 1 enlarged lymph node.   b. CTA abdomen ordered by GI for further evaluation to evaluate for potential pseudoaneurysm - no pseudoaneurysm noted. c. Repeat EUS 4/2: cystic lesion in peripancreatic region consistent with pseudocyst with walled off necrosis. Cystogastrostomy created.  d. Per GI 4/2: Start on Cipro, PPI discontinued to allow for acid production, follow up cultures. Advance diet from CLD to soft if tolerated, plan for Necrosectomy on 4/5 as a day trip to [facility] (will make NPO after midnight) Consider EGD if concern for bleed.   2. Incidental Splenic Vein Occlusion Noted on CT angio a. Discussed with vascular surgery.  Patient's abdominal pain is likely from pseudocyst and chronic pancreatitis and splenic vein occlusion is likely incidental asymptomatic finding.  Will hold off on anticoagulation at this time however if patient becomes symptomatic in the future and/or there is interval progression with recommend starting anticoagulation at that time  3. Hematemesis a. No recurrence b. Plan as above  4. Diarrhea, resolved  5. Hypokalemia, replete   6. AKI, resolved  7. Type 2 diabetes continue Lantus to 15 units daily, increase NovoLog to 8 units 3 times daily with meals and sliding scale  8. Asthma, stable on room air  9. Hypertension a. Change Lopressor  to Coreg and continue amlodipine  10. Anxiety/depression/ Schizophrenia BPD  a. Continue trazodone, amitriptyline, Latuda  DVT prophylaxis:  SCDs Family Communication: Patient wished to update family on her own Disposition Plan:   Patient came from:   Home                                                                                          Anticipated d/c place: Home  Barriers to d/c: Upcoming procedure tomorrow pending further GI recommendations. Several days away from DC  Pressure injury documentation    None  Consultants  GI Discussed findings of CTA abdomen with Vascular surgery   Procedures  3/31: EGD/EUS  Antibiotics   Anti-infectives (From admission, onward)   Start     Dose/Rate Route Frequency Ordered Stop   08/24/19 0800  ciprofloxacin (CIPRO) IVPB 400 mg     400 mg 200 mL/hr over 60 Minutes Intravenous Every 12 hours 08/24/19 0732     08/23/19 1800  ciprofloxacin (CIPRO) IVPB 400 mg  Status:  Discontinued     400 mg 200 mL/hr over 60 Minutes Intravenous Every 12 hours 08/23/19 0952 08/23/19 1146   08/23/19 1400  piperacillin-tazobactam (ZOSYN) IVPB 3.375 g  Status:  Discontinued     3.375 g 12.5 mL/hr over 240 Minutes Intravenous Every 8 hours 08/23/19 1147 08/24/19 0720        Subjective   Patient seen and examined at bedside no acute distress and resting comfortably.  No events overnight.  Tolerating diet.  Denies any chest pain, shortness of breath, fever, nausea, vomiting, urinary complaints.  Admits to having bowel movement.  Otherwise ROS negative   Objective   Vitals:   08/24/19 0534 08/24/19 1413 08/24/19 2200 08/25/19 0523  BP: (!) 146/94 (!) 162/99 139/89 (!) 135/95  Pulse: (!) 110 (!) 106 96 (!) 101  Resp: 16 16 16 16   Temp: 98.7 F (37.1 C) 97.9 F (36.6 C) 98 F (36.7 C) 98.1 F (36.7 C)  TempSrc:   Oral Oral  SpO2: 95% 97% 93% 94%  Weight:      Height:        Intake/Output Summary (Last 24 hours) at 08/25/2019 1123 Last data filed at 08/25/2019 10/25/2019 Gross per 24 hour  Intake 2756.67 ml  Output 502 ml  Net 2254.67 ml   Filed Weights   08/18/19 1059 08/21/19  1154 08/23/19 0713  Weight: 135.6 kg 135.6 kg 135.6 kg    Examination:  Physical Exam Vitals and nursing note reviewed.  Constitutional:      Appearance: Normal appearance.  HENT:     Head: Normocephalic and atraumatic.  Eyes:     Conjunctiva/sclera: Conjunctivae normal.  Cardiovascular:     Rate and Rhythm: Normal rate and regular rhythm.  Pulmonary:     Effort: Pulmonary effort is normal.     Breath sounds: Normal breath sounds.  Abdominal:     General: Abdomen is flat.     Palpations: Abdomen is soft.  Musculoskeletal:        General: No swelling or tenderness.  Skin:    Coloration: Skin is not jaundiced or pale.  Neurological:  Mental Status: She is alert. Mental status is at baseline.  Psychiatric:        Mood and Affect: Mood normal.        Behavior: Behavior normal.     Data Reviewed: I have personally reviewed following labs and imaging studies  CBC: Recent Labs  Lab 08/20/19 0031 08/21/19 0436 08/22/19 0422 08/23/19 0426 08/24/19 0446  WBC 4.7 5.2 5.4 5.0 7.7  HGB 10.4* 9.7* 9.6* 9.3* 10.0*  HCT 36.3 33.5* 33.9* 32.7* 34.0*  MCV 78.2* 77.5* 78.1* 77.3* 77.3*  PLT 243 216 221 218 782   Basic Metabolic Panel: Recent Labs  Lab 08/20/19 0031 08/20/19 0823 08/21/19 0436 08/22/19 0422 08/23/19 0426 08/24/19 0446  NA  --  137 140 138 139 137  K  --  3.6 3.5 3.4* 3.8 3.6  CL  --  104 107 106 106 103  CO2  --  21* 24 24 25 26   GLUCOSE  --  301* 284* 191* 233* 253*  BUN  --  7 8 6  5* <5*  CREATININE  --  0.77 0.71 0.61 0.70 0.67  CALCIUM  --  8.8* 8.7* 8.5* 8.6* 8.6*  MG 1.7  --   --   --   --   --   PHOS 3.6  --   --   --   --   --    GFR: Estimated Creatinine Clearance: 120.3 mL/min (by C-G formula based on SCr of 0.67 mg/dL). Liver Function Tests: Recent Labs  Lab 08/20/19 0031 08/22/19 0422 08/24/19 0446  AST 27 17 18   ALT 18 17 15   ALKPHOS 64 56 61  BILITOT 0.3 0.5 0.4  PROT 6.9 6.1* 6.2*  ALBUMIN 3.6 3.1* 3.3*   No results  for input(s): LIPASE, AMYLASE in the last 168 hours. No results for input(s): AMMONIA in the last 168 hours. Coagulation Profile: Recent Labs  Lab 08/20/19 0031  INR 0.9   Cardiac Enzymes: No results for input(s): CKTOTAL, CKMB, CKMBINDEX, TROPONINI in the last 168 hours. BNP (last 3 results) No results for input(s): PROBNP in the last 8760 hours. HbA1C: No results for input(s): HGBA1C in the last 72 hours. CBG: Recent Labs  Lab 08/24/19 0739 08/24/19 1132 08/24/19 1605 08/24/19 2202 08/25/19 0729  GLUCAP 219* 222* 178* 307* 239*   Lipid Profile: No results for input(s): CHOL, HDL, LDLCALC, TRIG, CHOLHDL, LDLDIRECT in the last 72 hours. Thyroid Function Tests: No results for input(s): TSH, T4TOTAL, FREET4, T3FREE, THYROIDAB in the last 72 hours. Anemia Panel: No results for input(s): VITAMINB12, FOLATE, FERRITIN, TIBC, IRON, RETICCTPCT in the last 72 hours. Sepsis Labs: No results for input(s): PROCALCITON, LATICACIDVEN in the last 168 hours.  Recent Results (from the past 240 hour(s))  SARS CORONAVIRUS 2 (TAT 6-24 HRS) Nasopharyngeal Nasopharyngeal Swab     Status: None   Collection Time: 08/17/19  2:10 AM   Specimen: Nasopharyngeal Swab  Result Value Ref Range Status   SARS Coronavirus 2 NEGATIVE NEGATIVE Final    Comment: (NOTE) SARS-CoV-2 target nucleic acids are NOT DETECTED. The SARS-CoV-2 RNA is generally detectable in upper and lower respiratory specimens during the acute phase of infection. Negative results do not preclude SARS-CoV-2 infection, do not rule out co-infections with other pathogens, and should not be used as the sole basis for treatment or other patient management decisions. Negative results must be combined with clinical observations, patient history, and epidemiological information. The expected result is Negative. Fact Sheet for Patients: SugarRoll.be Fact Sheet for  Healthcare  Providers: quierodirigir.com This test is not yet approved or cleared by the [place] and  has been authorized for detection and/or diagnosis of SARS-CoV-2 by FDA under an Emergency Use Authorization (EUA). This EUA will remain  in effect (meaning this test can be used) for the duration of the COVID-19 declaration under Section 56 4(b)(1) of the Act, 21 U.S.C. section 360bbb-3(b)(1), unless the authorization is terminated or revoked sooner. Performed at [facility], [address] [place], [place] [address]   Aerobic/Anaerobic Culture (surgical/deep wound)     Status: Abnormal (Preliminary result)   Collection Time: 08/23/19  8:11 AM   Specimen: PATH GI biopsy; Tissue  Result Value Ref Range Status   Specimen Description   Final    CYSTS PANCREATIC PSEUDOCYST ASPIRATE Performed at [facility], [address], [place], [place] [address]    Special Requests   Final    NONE Performed at [facility], [address], [place], [place] [address]    Gram Stain   Final    NO WBC SEEN NO ORGANISMS SEEN Performed at [facility], [address], [place], [place] [address]    Culture (A)  Final    MULTIPLE SPECIES PRESENT, SUGGEST RECOLLECTION NO ANAEROBES ISOLATED; CULTURE IN PROGRESS FOR 5 DAYS    Report Status PENDING  Incomplete         Radiology Studies: No results found.      Scheduled Meds: . amitriptyline  75 mg Oral QHS  . amLODipine  10 mg Oral Daily  . carvedilol  12.5 mg Oral BID WC  . feeding supplement (GLUCERNA SHAKE)  237 mL Oral TID BM  . insulin aspart  0-15 Units Subcutaneous TID WC  . insulin aspart  0-5 Units Subcutaneous QHS  . insulin aspart  8 Units Subcutaneous TID WC  . insulin glargine  15 Units Subcutaneous Daily  . lamoTRIgine  200 mg Oral Daily  . lurasidone  60 mg Oral QHS  . polyethylene glycol  17 g Oral BID  . traZODone  100 mg Oral QHS   Continuous  Infusions: . sodium chloride Stopped (08/24/19 2240)  . ciprofloxacin 400 mg (08/25/19 0830)     Time spent: 15 minutes with over 50% of the time coordinating the patient's care    Jae Dire, DO [facility] Pager [phone]  Call night coverage person covering after 7pm

## 2019-08-26 ENCOUNTER — Other Ambulatory Visit: Payer: Self-pay

## 2019-08-26 ENCOUNTER — Encounter (HOSPITAL_COMMUNITY)
Admission: EM | Disposition: A | Discharge status: Home / Self Care | Payer: Self-pay | Source: Home / Self Care | Attending: Internal Medicine

## 2019-08-26 ENCOUNTER — Encounter (HOSPITAL_COMMUNITY): Payer: Self-pay | Admitting: Family Medicine

## 2019-08-26 ENCOUNTER — Inpatient Hospital Stay (HOSPITAL_COMMUNITY): Payer: 59 | Admitting: Anesthesiology

## 2019-08-26 DIAGNOSIS — K8592 Acute pancreatitis with infected necrosis, unspecified: Secondary | ICD-10-CM

## 2019-08-26 HISTORY — PX: STENT REMOVAL: SHX6421

## 2019-08-26 HISTORY — PX: ENDOROTOR: SHX6859

## 2019-08-26 HISTORY — PX: ESOPHAGOGASTRODUODENOSCOPY: SHX5428

## 2019-08-26 LAB — CBC
HCT: 33.8 % — ABNORMAL LOW (ref 36.0–46.0)
Hemoglobin: 9.7 g/dL — ABNORMAL LOW (ref 12.0–15.0)
MCH: 22.2 pg — ABNORMAL LOW (ref 26.0–34.0)
MCHC: 28.7 g/dL — ABNORMAL LOW (ref 30.0–36.0)
MCV: 77.3 fL — ABNORMAL LOW (ref 80.0–100.0)
Platelets: 207 10*3/uL (ref 150–400)
RBC: 4.37 MIL/uL (ref 3.87–5.11)
RDW: 15 % (ref 11.5–15.5)
WBC: 6 10*3/uL (ref 4.0–10.5)
nRBC: 0 % (ref 0.0–0.2)

## 2019-08-26 LAB — BASIC METABOLIC PANEL
Anion gap: 9 (ref 5–15)
BUN: 7 mg/dL (ref 6–20)
CO2: 24 mmol/L (ref 22–32)
Calcium: 8.9 mg/dL (ref 8.9–10.3)
Chloride: 104 mmol/L (ref 98–111)
Creatinine, Ser: 0.69 mg/dL (ref 0.44–1.00)
GFR calc Af Amer: >60 mL/min (ref >60)
GFR calc non Af Amer: >60 mL/min (ref >60)
Glucose, Bld: 274 mg/dL — ABNORMAL HIGH (ref 70–99)
Potassium: 3.5 mmol/L (ref 3.5–5.1)
Sodium: 137 mmol/L (ref 135–145)

## 2019-08-26 LAB — GLUCOSE, CAPILLARY
Glucose-Capillary: 244 mg/dL — ABNORMAL HIGH (ref 70–99)
Glucose-Capillary: 196 mg/dL — ABNORMAL HIGH (ref 70–99)
Glucose-Capillary: 289 mg/dL — ABNORMAL HIGH (ref 70–99)
Glucose-Capillary: 483 mg/dL — ABNORMAL HIGH (ref 70–99)
Glucose-Capillary: 453 mg/dL — ABNORMAL HIGH (ref 70–99)

## 2019-08-26 LAB — SURGICAL PATHOLOGY

## 2019-08-26 SURGERY — EGD (ESOPHAGOGASTRODUODENOSCOPY)
Anesthesia: General

## 2019-08-26 MED ORDER — FLUCONAZOLE 200 MG PO TABS
200.0000 mg | ORAL_TABLET | Freq: Every day | ORAL | Status: DC
  Administered 2019-08-27: 200 mg via ORAL
  Filled 2019-08-26: qty 2
  Filled 2019-08-26: qty 1

## 2019-08-26 MED ORDER — INSULIN ASPART 100 UNIT/ML ~~LOC~~ SOLN
6.0000 [IU] | Freq: Once | SUBCUTANEOUS | Status: AC
  Administered 2019-08-26: 6 [IU] via SUBCUTANEOUS

## 2019-08-26 MED ORDER — LURASIDONE HCL 40 MG PO TABS
40.0000 mg | ORAL_TABLET | Freq: Every day | ORAL | Status: DC
  Administered 2019-08-26: 40 mg via ORAL
  Filled 2019-08-26 (×2): qty 1

## 2019-08-26 MED ORDER — LIDOCAINE 2% (20 MG/ML) 5 ML SYRINGE
INTRAMUSCULAR | Status: DC | PRN
  Administered 2019-08-26: 100 mg via INTRAVENOUS

## 2019-08-26 MED ORDER — LACTATED RINGERS IV SOLN: INTRAVENOUS | Status: DC

## 2019-08-26 MED ORDER — DEXAMETHASONE SODIUM PHOSPHATE 10 MG/ML IJ SOLN
INTRAMUSCULAR | Status: DC | PRN
  Administered 2019-08-26: 4 mg via INTRAVENOUS

## 2019-08-26 MED ORDER — FLUCONAZOLE 200 MG PO TABS
400.0000 mg | ORAL_TABLET | Freq: Once | ORAL | Status: AC
  Administered 2019-08-26: 400 mg via ORAL
  Filled 2019-08-26: qty 4
  Filled 2019-08-26: qty 2

## 2019-08-26 MED ORDER — ONDANSETRON HCL 4 MG/2ML IJ SOLN
INTRAMUSCULAR | Status: DC | PRN
  Administered 2019-08-26: 4 mg via INTRAVENOUS

## 2019-08-26 MED ORDER — SUGAMMADEX SODIUM 200 MG/2ML IV SOLN
INTRAVENOUS | Status: DC | PRN
  Administered 2019-08-26: 200 mg via INTRAVENOUS

## 2019-08-26 MED ORDER — SUCCINYLCHOLINE CHLORIDE 200 MG/10ML IV SOSY
PREFILLED_SYRINGE | INTRAVENOUS | Status: DC | PRN
  Administered 2019-08-26: 140 mg via INTRAVENOUS

## 2019-08-26 MED ORDER — SODIUM CHLORIDE 0.9 % IV SOLN: INTRAVENOUS | Status: DC

## 2019-08-26 MED ORDER — PHENYLEPHRINE HCL-NACL 10-0.9 MG/250ML-% IV SOLN: INTRAVENOUS | Status: DC | PRN

## 2019-08-26 MED ORDER — ROCURONIUM BROMIDE 10 MG/ML (PF) SYRINGE
PREFILLED_SYRINGE | INTRAVENOUS | Status: DC | PRN
  Administered 2019-08-26: 50 mg via INTRAVENOUS

## 2019-08-26 MED ORDER — PHENYLEPHRINE 40 MCG/ML (10ML) SYRINGE FOR IV PUSH (FOR BLOOD PRESSURE SUPPORT)
PREFILLED_SYRINGE | INTRAVENOUS | Status: DC | PRN
  Administered 2019-08-26 (×2): 120 ug via INTRAVENOUS

## 2019-08-26 MED ORDER — PROPOFOL 10 MG/ML IV BOLUS
INTRAVENOUS | Status: DC | PRN
  Administered 2019-08-26: 175 mg via INTRAVENOUS

## 2019-08-26 NOTE — Anesthesia Procedure Notes (Signed)
Procedure Name: Intubation Performed by: Ezekiel Ina, CRNA Pre-anesthesia Checklist: Patient identified, Emergency Drugs available, Suction available and Patient being monitored Patient Re-evaluated:Patient Re-evaluated prior to induction Oxygen Delivery Method: Circle System Utilized Preoxygenation: Pre-oxygenation with 100% oxygen Induction Type: IV induction and Rapid sequence Grade View: Grade I Tube type: Oral Tube size: 7.5 mm Number of attempts: 1 Airway Equipment and Method: Stylet Placement Confirmation: ETT inserted through vocal cords under direct vision,  positive ETCO2 and breath sounds checked- equal and bilateral Secured at: 23 cm Tube secured with: Tape Dental Injury: Teeth and Oropharynx as per pre-operative assessment

## 2019-08-26 NOTE — Progress Notes (Signed)
PROGRESS NOTE    Monique Holland    Code Status: Full Code  OZH:[id number] DOB: 07/04/1965 DOA: 08/16/2019 LOS: 9 days  PCP: Kristie Cowman, MD CC:  Chief Complaint  Patient presents with  . Nausea  . Emesis       Hospital Summary   54 year old with past medical history significant for ID DM2, hypertension, gastritis, chronic pancreatitis with pseudocyst, morbid obesity who presents complaining of abdominal pain and vomiting a small amount of blood.  -Patient last hospitalization from 08/02/2019 130 07/1618 21 for epigastric abdominal pain nausea vomiting thought to be secondary to gastritis esophagitis.  Renal admission she had an MRCP which showed 12.6 cm pseudocyst, unchanged from recent CT scan. 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  Patient admitted with recurrent vomiting.  Underwent endoscopy which was normal except for Mild to moderate extrinsic compression in the region of duodenal bulb from Pancreatic pseudocyst.    3/31: EGD/EUS with Dr. Rush Landmark -acquired extrinsic deformity and posterior wall of stomach, erythematous mucosa of gastric body and antrum.  85 x 84 mm cyst walled off necrosis in peripancreatic region consistent with pancreatic pseudocyst and necrosis with 1 enlarged lymph node.  Further imaging ordered by GI for further evaluation to evaluate for potential pseudoaneurysm - no pseudoaneurysm on imaging  4/2: Repeat EUS - cystic lesion in peripancreatic region consistent with pseudocyst with walled off necrosis. Cystogastrostomy created. Started on Cipro  A & P   Active Problems:   AKI (acute kidney injury) ([place])   Type 2 diabetes mellitus (HCC)   Schizophrenia (HCC)   Obesity, Class III, BMI 40-49.9 (morbid obesity) (HCC)   Acute  pancreatitis   Pancreatic pseudocyst   Abdominal pain   Essential hypertension   Non-intractable vomiting   Hematemesis with nausea   1. Acute on chronic abdominal pain, chronic pancreatitis, pancreatic pseudocyst, persistent vomiting a. S/p EGD/EUS 3/31:acquired extrinsic deformity and posterior wall of stomach, erythematous mucosa of gastric body and antrum.  85 x 84 mm cyst walled off necrosis in peripancreatic region consistent with pancreatic pseudocyst and necrosis with 1 enlarged lymph node.   b. CTA abdomen ordered by GI for further evaluation to evaluate for potential pseudoaneurysm - no pseudoaneurysm noted. c. Repeat EUS 4/2: cystic lesion in peripancreatic region consistent with pseudocyst with walled off necrosis. Cystogastrostomy created.  d. Per GI 4/2: Start on Cipro, PPI discontinued to allow for acid production, follow up cultures. Advance diet from CLD to soft if tolerated, plan for Necrosectomy on 4/5 as a day trip to [facility] (will make NPO after midnight) Consider EGD if concern for bleed.  e. 4/5Martin Majestic to [facility] today for procedure, follow up, appreciate further recommendations per Dr. Rush Landmark  2. Incidental Splenic Vein Occlusion Noted on CT angio a. Discussed with vascular surgery.  Patient's abdominal pain is likely from pseudocyst and chronic pancreatitis and splenic vein occlusion is likely incidental asymptomatic finding.  Will hold off on anticoagulation at this time however if patient becomes symptomatic in the future and/or there is interval progression with recommend starting anticoagulation at that time  3. Hematemesis a. No recurrence b. Plan as above  4. Diarrhea, resolved  5. Hypokalemia, replete   6. AKI, resolved  7. Type 2 diabetes continue Lantus to 15 units daily, increase NovoLog to 8 units 3 times daily with meals  and sliding scale  8. Asthma, stable on room air  9. Hypertension a. Change Lopressor to Coreg and continue  amlodipine  10. Anxiety/depression/ Schizophrenia BPD  a. Continue trazodone, amitriptyline, Latuda  DVT prophylaxis: SCDs Family Communication: Patient wished to update family on her own Disposition Plan:   Patient came from:   Home                                                                                          Anticipated d/c place: Home  Barriers to d/c: Dc in 24 to 48 hours if able to tolerate PO intake  Pressure injury documentation    None  Consultants  GI Discussed findings of CTA abdomen with Vascular surgery   Procedures  3/31: EGD/EUS  Antibiotics   Anti-infectives (From admission, onward)   Start     Dose/Rate Route Frequency Ordered Stop   08/27/19 1000  fluconazole (DIFLUCAN) tablet 200 mg     200 mg Oral Daily 08/26/19 1650 09/09/19 0959   08/26/19 1700  fluconazole (DIFLUCAN) tablet 400 mg     400 mg Oral  Once 08/26/19 1650     08/24/19 0800  [MAR Hold]  ciprofloxacin (CIPRO) IVPB 400 mg     (MAR Hold since Mon 08/26/2019 at 1256.Hold Reason: Transfer to a Procedural area.)   400 mg 200 mL/hr over 60 Minutes Intravenous Every 12 hours 08/24/19 0732     08/23/19 1800  ciprofloxacin (CIPRO) IVPB 400 mg  Status:  Discontinued     400 mg 200 mL/hr over 60 Minutes Intravenous Every 12 hours 08/23/19 0952 08/23/19 1146   08/23/19 1400  piperacillin-tazobactam (ZOSYN) IVPB 3.375 g  Status:  Discontinued     3.375 g 12.5 mL/hr over 240 Minutes Intravenous Every 8 hours 08/23/19 1147 08/24/19 0720        Subjective   Patient seen and examined at bedside no acute distress and resting comfortably prior to procedure today.  No events overnight.  Was NPO overnight.  Denies any chest pain, shortness of breath, fever, nausea, vomiting, urinary complaints.  Admits to having bowel movement.  Otherwise ROS negative    Objective   Vitals:   08/26/19 0452 08/26/19 1309 08/26/19 1638 08/26/19 1640  BP: 129/76 136/84 135/64 135/64  Pulse: 97 92  (!) 103   Resp: 18 15 (!) 93 13  Temp: 98 F (36.7 C) (!) 97.3 F (36.3 C) 99.7 F (37.6 C)   TempSrc:  Temporal Temporal   SpO2: 97% 99%  93%  Weight:  135.6 kg    Height:  5\' 8"  (1.727 m)      Intake/Output Summary (Last 24 hours) at 08/26/2019 1651 Last data filed at 08/26/2019 1639 Gross per 24 hour  Intake 1959.24 ml  Output 0 ml  Net 1959.24 ml   Filed Weights   08/21/19 1154 08/23/19 0713 08/26/19 1309  Weight: 135.6 kg 135.6 kg 135.6 kg    Examination:  Physical Exam Vitals and nursing note reviewed.  Constitutional:      Appearance: Normal appearance.  HENT:     Head: Normocephalic and atraumatic.  Eyes:  Conjunctiva/sclera: Conjunctivae normal.  Cardiovascular:     Rate and Rhythm: Normal rate and regular rhythm.  Pulmonary:     Effort: Pulmonary effort is normal.     Breath sounds: Normal breath sounds.  Abdominal:     Tenderness: There is abdominal tenderness in the epigastric area.  Musculoskeletal:        General: No swelling or tenderness.  Skin:    Coloration: Skin is not jaundiced or pale.  Neurological:     Mental Status: She is alert. Mental status is at baseline.  Psychiatric:        Mood and Affect: Mood normal.        Behavior: Behavior normal.     Data Reviewed: I have personally reviewed following labs and imaging studies  CBC: Recent Labs  Lab 08/21/19 0436 08/22/19 0422 08/23/19 0426 08/24/19 0446 08/26/19 0458  WBC 5.2 5.4 5.0 7.7 6.0  HGB 9.7* 9.6* 9.3* 10.0* 9.7*  HCT 33.5* 33.9* 32.7* 34.0* 33.8*  MCV 77.5* 78.1* 77.3* 77.3* 77.3*  PLT 216 221 218 240 207   Basic Metabolic Panel: Recent Labs  Lab 08/20/19 0031 08/20/19 0823 08/21/19 0436 08/22/19 0422 08/23/19 0426 08/24/19 0446 08/26/19 0458  NA  --    < > 140 138 139 137 137  K  --    < > 3.5 3.4* 3.8 3.6 3.5  CL  --    < > 107 106 106 103 104  CO2  --    < > 24 24 25 26 24   GLUCOSE  --    < > 284* 191* 233* 253* 274*  BUN  --    < > 8 6 5* <5* 7  CREATININE   --    < > 0.71 0.61 0.70 0.67 0.69  CALCIUM  --    < > 8.7* 8.5* 8.6* 8.6* 8.9  MG 1.7  --   --   --   --   --   --   PHOS 3.6  --   --   --   --   --   --    < > = values in this interval not displayed.   GFR: Estimated Creatinine Clearance: 120.3 mL/min (by C-G formula based on SCr of 0.69 mg/dL). Liver Function Tests: Recent Labs  Lab 08/20/19 0031 08/22/19 0422 08/24/19 0446  AST 27 17 18   ALT 18 17 15   ALKPHOS 64 56 61  BILITOT 0.3 0.5 0.4  PROT 6.9 6.1* 6.2*  ALBUMIN 3.6 3.1* 3.3*   No results for input(s): LIPASE, AMYLASE in the last 168 hours. No results for input(s): AMMONIA in the last 168 hours. Coagulation Profile: Recent Labs  Lab 08/20/19 0031  INR 0.9   Cardiac Enzymes: No results for input(s): CKTOTAL, CKMB, CKMBINDEX, TROPONINI in the last 168 hours. BNP (last 3 results) No results for input(s): PROBNP in the last 8760 hours. HbA1C: No results for input(s): HGBA1C in the last 72 hours. CBG: Recent Labs  Lab 08/25/19 1205 08/25/19 1634 08/25/19 2207 08/26/19 0803 08/26/19 1138  GLUCAP 278* 248* 250* 244* 196*   Lipid Profile: No results for input(s): CHOL, HDL, LDLCALC, TRIG, CHOLHDL, LDLDIRECT in the last 72 hours. Thyroid Function Tests: No results for input(s): TSH, T4TOTAL, FREET4, T3FREE, THYROIDAB in the last 72 hours. Anemia Panel: No results for input(s): VITAMINB12, FOLATE, FERRITIN, TIBC, IRON, RETICCTPCT in the last 72 hours. Sepsis Labs: No results for input(s): PROCALCITON, LATICACIDVEN in the last 168 hours.  Recent Results (  from the past 240 hour(s))  SARS CORONAVIRUS 2 (TAT 6-24 HRS) Nasopharyngeal Nasopharyngeal Swab     Status: None   Collection Time: 08/17/19  2:10 AM   Specimen: Nasopharyngeal Swab  Result Value Ref Range Status   SARS Coronavirus 2 NEGATIVE NEGATIVE Final    Comment: (NOTE) SARS-CoV-2 target nucleic acids are NOT DETECTED. The SARS-CoV-2 RNA is generally detectable in upper and lower respiratory  specimens during the acute phase of infection. Negative results do not preclude SARS-CoV-2 infection, do not rule out co-infections with other pathogens, and should not be used as the sole basis for treatment or other patient management decisions. Negative results must be combined with clinical observations, patient history, and epidemiological information. The expected result is Negative. Fact Sheet for Patients: HairSlick.no Fact Sheet for Healthcare Providers: quierodirigir.com This test is not yet approved or cleared by the [place] FDA and  has been authorized for detection and/or diagnosis of SARS-CoV-2 by FDA under an Emergency Use Authorization (EUA). This EUA will remain  in effect (meaning this test can be used) for the duration of the COVID-19 declaration under Section 56 4(b)(1) of the Act, 21 U.S.C. section 360bbb-3(b)(1), unless the authorization is terminated or revoked sooner. Performed at [facility], [address], [place], [place] [address]   Aerobic/Anaerobic Culture (surgical/deep wound)     Status: Abnormal (Preliminary result)   Collection Time: 08/23/19  8:11 AM   Specimen: PATH GI biopsy; Tissue  Result Value Ref Range Status   Specimen Description   Final    CYSTS PANCREATIC PSEUDOCYST ASPIRATE Performed at [facility], [address] [place], [place] [address]    Special Requests   Final    NONE Performed at [facility], [address] [place], [place] [address]    Gram Stain   Final    NO WBC SEEN NO ORGANISMS SEEN Performed at [facility], [address] [place], [place] [address]    Culture (A)  Final    MULTIPLE SPECIES PRESENT, SUGGEST RECOLLECTION NO ANAEROBES ISOLATED; CULTURE IN PROGRESS FOR 5 DAYS    Report Status PENDING  Incomplete         Radiology Studies: No results found.      Scheduled Meds: . [MAR Hold]  amitriptyline  75 mg Oral QHS  . [MAR Hold] amLODipine  10 mg Oral Daily  . [MAR Hold] carvedilol  12.5 mg Oral BID WC  . [MAR Hold] feeding supplement (GLUCERNA SHAKE)  237 mL Oral TID BM  . [START ON 08/27/2019] fluconazole  200 mg Oral Daily  . fluconazole  400 mg Oral Once  . [MAR Hold] insulin aspart  0-15 Units Subcutaneous TID WC  . [MAR Hold] insulin aspart  0-5 Units Subcutaneous QHS  . [MAR Hold] insulin aspart  8 Units Subcutaneous TID WC  . [MAR Hold] insulin glargine  15 Units Subcutaneous Daily  . [MAR Hold] lamoTRIgine  200 mg Oral Daily  . [MAR Hold] lurasidone  60 mg Oral QHS  . [MAR Hold] polyethylene glycol  17 g Oral BID  . [MAR Hold] traZODone  100 mg Oral QHS   Continuous Infusions: . [MAR Hold] sodium chloride Stopped (08/24/19 2240)  . sodium chloride Stopped (08/26/19 1303)  . [MAR Hold] ciprofloxacin 400 mg (08/26/19 0822)  . lactated ringers Stopped (08/26/19 1644)     Time spent: 16 minutes with over 50% of the time coordinating the patient's care    Jilda Panda  Lamount Cohen, DO [facility] Pager [phone]  Call night coverage person covering after 7pm

## 2019-08-26 NOTE — Interval H&P Note (Signed)
History and Physical Interval Note:  08/26/2019 2:04 PM  Monique Holland  has presented today for surgery, with the diagnosis of Pancreatic Necrosis.  The various methods of treatment have been discussed with the patient and family. After consideration of risks, benefits and other options for treatment, the patient has consented to  Procedure(s): ESOPHAGOGASTRODUODENOSCOPY (EGD) with NECROSECTOMY (N/A) ENDOROTOR (N/A) as a surgical intervention.  The patient's history has been reviewed, patient examined, no change in status, stable for surgery.  I have reviewed the patient's chart and labs.  Questions were answered to the patient's satisfaction.     [employer]

## 2019-08-26 NOTE — Anesthesia Postprocedure Evaluation (Signed)
Anesthesia Post Note  Patient: Monique Holland  Procedure(s) Performed: ESOPHAGOGASTRODUODENOSCOPY (EGD) with NECROSECTOMY (N/A ) ENDOROTOR (N/A ) STENT REMOVAL     Patient location during evaluation: PACU Anesthesia Type: General Level of consciousness: awake Pain management: pain level controlled Vital Signs Assessment: post-procedure vital signs reviewed and stable Respiratory status: spontaneous breathing Cardiovascular status: blood pressure returned to baseline Postop Assessment: no apparent nausea or vomiting Anesthetic complications: no    Last Vitals:  Vitals:   08/26/19 2016 08/26/19 2122  BP: (!) 145/88 (!) 141/89  Pulse: (!) 104 (!) 103  Resp: 18 18  Temp: 36.9 C 37.1 C  SpO2: 100% 97%    Last Pain:  Vitals:   08/26/19 2122  TempSrc: Oral  PainSc:                  Zaine Elsass

## 2019-08-26 NOTE — Op Note (Signed)
[facility] Patient Name: Monique Holland Procedure Date : 08/26/2019 MRN: [id number] Attending MD: Justice Britain , MD Date of Birth: [birth year] CSN: [id number] Age: 54 Admit Type: Inpatient Procedure:                Upper GI endoscopy Indications:              Pancreatic necrosis Providers:                Justice Britain, MD, Carlyn Reichert, RN, Laverda Sorenson, Technician, Lerry Paterson, CRNA Referring MD:             Carlota Raspberry. Havery Moros, MD, Gatha Mayer, MD, Irene Shipper, MD, Gerrit Heck, MD, [facility] Medicines:                General Anesthesia Complications:            No immediate complications. Estimated Blood Loss:     Estimated blood loss was minimal. Procedure:                Pre-Anesthesia Assessment:                           - Prior to the procedure, a History and Physical                            was performed, and patient medications and                            allergies were reviewed. The patient's tolerance of                            previous anesthesia was also reviewed. The risks                            and benefits of the procedure and the sedation                            options and risks were discussed with the patient.                            All questions were answered, and informed consent                            was obtained. Prior Anticoagulants: The patient has                            taken no previous anticoagulant or antiplatelet  agents. ASA Grade Assessment: III - A patient with                            severe systemic disease. After reviewing the risks                            and benefits, the patient was deemed in                            satisfactory condition to undergo the procedure.                           After obtaining informed consent, the endoscope was         passed under direct vision. Throughout the                            procedure, the patient's blood pressure, pulse, and                            oxygen saturations were monitored continuously. The                            [id number] ([id number] Olympus therapeutic                            gastroscope was introduced through the mouth, and                            advanced to the second part of duodenum. The upper                            GI endoscopy was accomplished without difficulty.                            The patient tolerated the procedure. Scope In: Scope Out: Findings:      White nummular lesions were noted in the proximal esophagus and in the       mid esophagus - previously biopsied with results discussed below.      No other gross lesions were noted in the distal esophagus and at the       gastroesophageal junction.      Patchy mildly erythematous mucosa without bleeding was found in the       entire examined stomach.      A previously placed AXIOS cystgastrostomy stent with two double pigtail       stents was found on the posterior wall of the gastric body. The two       double pigtails were removed with a snare. Under direct vision the       cavity was entered. The Walled-Off Necrotic Cyst was completely filled       with fluid and black necrotic tissue that was pasty and adherent to the       cyst wall. Necrosectomy was performed with a Raptor grasping device and       Endorotor Powered Endoscopic Debrider, requiring multiple intubations of  the cyst. We performed greater than 60 minutes of debridement today. At       the conclusion of the procedure, a medium amount of necrotic tissue and       a medium amount of pink, viable tissue was found within the pseudocyst       on direct vision. Two 10 French x 5 cm double pigtail stents were       inserted across the AXIOS cystgastrostomy tract in order to decrease       chance of necroma clogging AXIOS and  to allow gastric juices/acid to       work within the walled off necrosis.      No other gross lesions were noted in the entire examined stomach.      No gross lesions were noted in the duodenal bulb, in the first portion       of the duodenum and in the second portion of the duodenum. Impression:               - White nummular lesions in esophageal mucosa                            proximally - biopsied previously and just returned                            as Candida.                           - Erythematous mucosa in the stomach.                           - Pre-existing AXIOS Cystgastrostomy with Double                            pigtails. Double pigtails removed. Necrosectomy                            performed. Double pigtails placed across AXIOS.                           - No gross lesions in the duodenal bulb, in the                            first portion of the duodenum and in the second                            portion of the duodenum. Recommendation:           - The patient will be observed post-procedure,                            until all discharge criteria are met.                           - Return patient to hospital ward for ongoing care.                           - Patient has a contact number available  for                            emergencies. The signs and symptoms of potential                            delayed complications were discussed with the                            patient. Return to normal activities tomorrow.                            Written discharge instructions were provided to the                            patient.                           - Advance diet as tolerated.                           - Observe patient's clinical course.                           - Continue present medications.                           - Start Fluconazole 400 mg on Day 1 and 200 mg Days                            2-14 (her previous esophagus biopsies returned                             today and were positive for Candida).                           - If patient tolerating PO then would transition                            patient tomorrow to PO Augmentin BID or                            Ciprofloxacin 500 mg BID (the previous culture data                            from initial aspiration showed no bacteria but now                            has multiple species growing that are not                            anaerobic).                           - If patient is doing well, she can be discharged  within next 24-48 hours and then we can work on                            scheduling a repeat Necrosectomy next week. If she                            is doing poorly then we can entertain another                            procedure this week as an inpatient.                           - Monitor for signs/symptoms of bleeding,                            perforation, and infection. If issues please call                            our number to get further assistance as needed.                           - The findings and recommendations were discussed                            with the patient.                           - The findings and recommendations were discussed                            with the patient's family. Procedure Code(s):        --- Professional ---                           [phone], Esophagogastroduodenoscopy, flexible,                            transoral; diagnostic, including collection of                            specimen(s) by brushing or washing, when performed                            (separate procedure)                           [phone], Unlisted procedure, pancreas Diagnosis Code(s):        --- Professional ---                           K22.8, Other specified diseases of esophagus                           K31.89, Other diseases of stomach and duodenum  Z97.8, Presence  of other specified devices                           K86.89, Other specified diseases of pancreas CPT copyright 2019 American Medical Association. All rights reserved. The codes documented in this report are preliminary and upon coder review may  be revised to meet current compliance requirements. Justice Britain, MD 08/26/2019 5:01:50 PM Number of Addenda: 0

## 2019-08-26 NOTE — Care Management Important Message (Signed)
Important Message  Patient Details IM Letter given to [name] SW Case Manager to present to the Patient Name: Monique Holland MRN: [id number] Date of Birth: 1965/11/30   Medicare Important Message Given:  Yes     Caren Macadam 08/26/2019, 12:38 PM

## 2019-08-26 NOTE — Transfer of Care (Signed)
Immediate Anesthesia Transfer of Care Note  Patient: Monique Holland  Procedure(s) Performed: ESOPHAGOGASTRODUODENOSCOPY (EGD) with NECROSECTOMY (N/A ) ENDOROTOR (N/A ) STENT REMOVAL  Patient Location: Endoscopy Unit  Anesthesia Type:General  Level of Consciousness: awake  Airway & Oxygen Therapy: Patient Spontanous Breathing  Post-op Assessment: Report given to RN and Post -op Vital signs reviewed and stable  Post vital signs: Reviewed and stable  Last Vitals:  Vitals Value Taken Time  BP 135/64 08/26/19 1638  Temp 37.6 C 08/26/19 1638  Pulse 100 08/26/19 1644  Resp 13 08/26/19 1644  SpO2 91 % 08/26/19 1644  Vitals shown include unvalidated device data.  Last Pain:  Vitals:   08/26/19 1638  TempSrc: Temporal  PainSc: 0-No pain      Patients Stated Pain Goal: 2 (08/24/19 1724)  Complications: No apparent anesthesia complications

## 2019-08-26 NOTE — Anesthesia Preprocedure Evaluation (Addendum)
Anesthesia Evaluation  Patient identified by MRN, date of birth, ID band Patient awake    Reviewed: Allergy & Precautions, Patient's Chart, lab work & pertinent test results, reviewed documented beta blocker date and time   Airway Mallampati: II  TM Distance: >3 FB Neck ROM: Full    Dental  (+) Teeth Intact, Dental Advisory Given, Loose,    Pulmonary asthma , sleep apnea and Continuous Positive Airway Pressure Ventilation ,    breath sounds clear to auscultation       Cardiovascular hypertension, Pt. on medications and Pt. on home beta blockers  Rhythm:Regular Rate:Normal     Neuro/Psych PSYCHIATRIC DISORDERS Anxiety Depression Schizophrenia negative neurological ROS     GI/Hepatic Neg liver ROS, GERD  ,  Endo/Other  diabetes, Type 2, Insulin DependentMorbid obesityPancreatic necrosis BMI 45  Renal/GU Renal disease  negative genitourinary   Musculoskeletal negative musculoskeletal ROS (+)   Abdominal Normal abdominal exam  (+)   Peds  Hematology  (+) Blood dyscrasia, anemia , hct 33.8, plt 207   Anesthesia Other Findings   Reproductive/Obstetrics negative OB ROS                           Anesthesia Physical Anesthesia Plan  ASA: III  Anesthesia Plan: General   Post-op Pain Management:    Induction: Intravenous  PONV Risk Score and Plan: 3 and Ondansetron, Dexamethasone and Treatment may vary due to age or medical condition  Airway Management Planned: Oral ETT  Additional Equipment: None  Intra-op Plan:   Post-operative Plan: Extubation in OR  Informed Consent: I have reviewed the patients History and Physical, chart, labs and discussed the procedure including the risks, benefits and alternatives for the proposed anesthesia with the patient or authorized representative who has indicated his/her understanding and acceptance.     Dental advisory given  Plan Discussed with:  CRNA and Anesthesiologist  Anesthesia Plan Comments:        Anesthesia Quick Evaluation

## 2019-08-26 NOTE — Progress Notes (Addendum)
Patient ID: Monique Holland, female   DOB: [birth year], 54 y.o.   MRN: [id number]    Progress Note   Subjective   Day # 9 CC; nausea and vomiting, recent severe pancreatitis now with necrotic pseudocyst  Patient underwent cystogastrostomy on 08/23/2019, planned for endoscopic necrosectomy later today  IV Cipro Today's labs-WBC 6.0, hemoglobin 9.7/hematocrit 33.8 stable Glucose 274  Patient says she feels pretty good, she has not had any nausea or vomiting, and denies any significant abdominal pain.  She is asking when to expect to go home.   Objective   Vital signs in last 24 hours: Temp:  [97.9 F (36.6 C)-98.2 F (36.8 C)] 98 F (36.7 C) (04/05 0452) Pulse Rate:  [90-97] 97 (04/05 0452) Resp:  [18] 18 (04/05 0452) BP: (129-158)/(76-103) 129/76 (04/05 0452) SpO2:  [97 %-100 %] 97 % (04/05 0452) Last BM Date: 08/25/19 General:     AA female in NAD, pleasant Heart:  Regular rate and rhythm; no murmurs Lungs: Respirations even and unlabored, lungs CTA bilaterally Abdomen:  Soft, obese , she is tender across the upper abdomen, no guarding and nondistended. Normal bowel sounds. Extremities:  Without edema. Neurologic:  Alert and oriented,  grossly normal neurologically. Psych:  Cooperative. Normal mood and affect.  Intake/Output from previous day: 04/04 0701 - 04/05 0700 In: 2499.2 [P.O.:1920; IV Piggyback:579.2] Out: 0  Intake/Output this shift: No intake/output data recorded.  Lab Results: Recent Labs    08/24/19 0446 08/26/19 0458  WBC 7.7 6.0  HGB 10.0* 9.7*  HCT 34.0* 33.8*  PLT 240 207   BMET Recent Labs    08/24/19 0446 08/26/19 0458  NA 137 137  K 3.6 3.5  CL 103 104  CO2 26 24  GLUCOSE 253* 274*  BUN <5* 7  CREATININE 0.67 0.69  CALCIUM 8.6* 8.9   LFT Recent Labs    08/24/19 0446  PROT 6.2*  ALBUMIN 3.3*  AST 18  ALT 15  ALKPHOS 61  BILITOT 0.4   PT/INR No results for input(s): LABPROT, INR in the last 72 hours.  Studies/Results: No  results found.     Assessment / Plan:    #71 54 year old African-American female with recent history of severe necrotizing pancreatitis, now with development of pancreatic pseudocyst with walled off necrosis. Was readmitted 9 days ago with nausea vomiting and increasing abdominal pain.  Patient has had improvement since admission symptomatically.   She is status post cyst gastrostomy on 08/23/2019 Continue IV Cipro Patient is scheduled for endoscopic necrosectomy today per Dr. Meridee Score  at [facility].  She may require more than 1 session to complete necrosectomy, and plans regarding timing of discharge can be better decided after today's procedure.   Active Problems:   AKI (acute kidney injury) (HCC)   Type 2 diabetes mellitus (HCC)   Schizophrenia (HCC)   Obesity, Class III, BMI 40-49.9 (morbid obesity) (HCC)   Acute pancreatitis   Pancreatic pseudocyst   Abdominal pain   Essential hypertension   Non-intractable vomiting   Hematemesis with nausea     LOS: 9 days   Nevae Pinnix EsterwoodPA-C  08/26/2019, 8:31 AM

## 2019-08-27 LAB — COMPREHENSIVE METABOLIC PANEL
ALT: 16 U/L (ref 0–44)
AST: 19 U/L (ref 15–41)
Albumin: 3.4 g/dL — ABNORMAL LOW (ref 3.5–5.0)
Alkaline Phosphatase: 69 U/L (ref 38–126)
Anion gap: 12 (ref 5–15)
BUN: 11 mg/dL (ref 6–20)
CO2: 23 mmol/L (ref 22–32)
Calcium: 9.5 mg/dL (ref 8.9–10.3)
Chloride: 101 mmol/L (ref 98–111)
Creatinine, Ser: 0.75 mg/dL (ref 0.44–1.00)
GFR calc Af Amer: >60 mL/min (ref >60)
GFR calc non Af Amer: >60 mL/min (ref >60)
Glucose, Bld: 412 mg/dL — ABNORMAL HIGH (ref 70–99)
Potassium: 4.1 mmol/L (ref 3.5–5.1)
Sodium: 136 mmol/L (ref 135–145)
Total Bilirubin: 0.4 mg/dL (ref 0.3–1.2)
Total Protein: 6.8 g/dL (ref 6.5–8.1)

## 2019-08-27 LAB — CBC
HCT: 35.5 % — ABNORMAL LOW (ref 36.0–46.0)
Hemoglobin: 10.4 g/dL — ABNORMAL LOW (ref 12.0–15.0)
MCH: 22.3 pg — ABNORMAL LOW (ref 26.0–34.0)
MCHC: 29.3 g/dL — ABNORMAL LOW (ref 30.0–36.0)
MCV: 76.2 fL — ABNORMAL LOW (ref 80.0–100.0)
Platelets: 250 10*3/uL (ref 150–400)
RBC: 4.66 MIL/uL (ref 3.87–5.11)
RDW: 14.9 % (ref 11.5–15.5)
WBC: 6.1 10*3/uL (ref 4.0–10.5)
nRBC: 0 % (ref 0.0–0.2)

## 2019-08-27 LAB — GLUCOSE, CAPILLARY
Glucose-Capillary: 360 mg/dL — ABNORMAL HIGH (ref 70–99)
Glucose-Capillary: 324 mg/dL — ABNORMAL HIGH (ref 70–99)

## 2019-08-27 MED ORDER — FLUCONAZOLE 200 MG PO TABS: 200.0000 mg | ORAL_TABLET | Freq: Every day | ORAL | 0 refills | Status: AC

## 2019-08-27 MED ORDER — AMOXICILLIN-POT CLAVULANATE 875-125 MG PO TABS
1.0000 | ORAL_TABLET | Freq: Two times a day (BID) | ORAL | Status: DC
  Administered 2019-08-27: 1 via ORAL
  Filled 2019-08-27: qty 1

## 2019-08-27 MED ORDER — LURASIDONE HCL 40 MG PO TABS: 40.0000 mg | ORAL_TABLET | Freq: Every day | ORAL | 0 refills | Status: DC

## 2019-08-27 MED ORDER — AMOXICILLIN-POT CLAVULANATE 875-125 MG PO TABS: 1.0000 | ORAL_TABLET | Freq: Two times a day (BID) | ORAL | 0 refills | Status: DC

## 2019-08-27 NOTE — Evaluation (Signed)
Physical Therapy One Time Evaluation Patient Details Name: Monique Holland MRN: [id number] DOB: [birth year] Today's Date: 08/27/2019   History of Present Illness  54 year old with past medical history significant for ID DM2, hypertension, gastritis, chronic pancreatitis with pseudocyst, morbid obesity who presents complaining of abdominal pain and vomiting a small amount of blood and admitted for Acute on chronic abdominal pain, chronic pancreatitis, pancreatic pseudocyst, persistent vomiting  Clinical Impression  Patient evaluated by Physical Therapy with no further acute PT needs identified. All education has been completed and the patient has no further questions.  See below for any follow-up Physical Therapy or equipment needs. PT is signing off. Thank you for this referral.     Follow Up Recommendations No PT follow up    Equipment Recommendations  None recommended by PT    Recommendations for Other Services       Precautions / Restrictions Precautions Precautions: None Restrictions Weight Bearing Restrictions: No      Mobility  Bed Mobility Overal bed mobility: Modified Independent                Transfers Overall transfer level: Modified independent                  Ambulation/Gait Ambulation/Gait assistance: Modified independent (Device/Increase time) Gait Distance (Feet): 400 Feet Assistive device: None Gait Pattern/deviations: Step-through pattern;Decreased stride length     General Gait Details: increased bil trunk sway however likely due to body habitus; pt reports ambulating at baseline  Stairs            Wheelchair Mobility    Modified Rankin (Stroke Patients Only)       Balance Overall balance assessment: No apparent balance deficits (not formally assessed)                                           Pertinent Vitals/Pain Pain Assessment: No/denies pain    Home Living Family/patient expects to be discharged  to:: Private residence Living Arrangements: Spouse/significant other;Children   Type of Home: House Home Access: Level entry     Home Layout: One level Home Equipment: None      Prior Function Level of Independence: Independent               Hand Dominance        Extremity/Trunk Assessment   Upper Extremity Assessment Upper Extremity Assessment: Overall WFL for tasks assessed    Lower Extremity Assessment Lower Extremity Assessment: Overall WFL for tasks assessed    Cervical / Trunk Assessment Cervical / Trunk Assessment: Normal  Communication   Communication: No difficulties  Cognition Arousal/Alertness: Awake/alert Behavior During Therapy: WFL for tasks assessed/performed Overall Cognitive Status: Within Functional Limits for tasks assessed                                        General Comments      Exercises     Assessment/Plan    PT Assessment Patent does not need any further PT services  PT Problem List         PT Treatment Interventions      PT Goals (Current goals can be found in the Care Plan section)  Acute Rehab PT Goals PT Goal Formulation: All assessment and education complete, DC therapy  Frequency     Barriers to discharge        Co-evaluation               AM-PAC PT "6 Clicks" Mobility  Outcome Measure Help needed turning from your back to your side while in a flat bed without using bedrails?: None Help needed moving from lying on your back to sitting on the side of a flat bed without using bedrails?: None Help needed moving to and from a bed to a chair (including a wheelchair)?: None Help needed standing up from a chair using your arms (e.g., wheelchair or bedside chair)?: None Help needed to walk in hospital room?: A Little Help needed climbing 3-5 steps with a railing? : A Little 6 Click Score: 22    End of Session   Activity Tolerance: Patient tolerated treatment well Patient left: in  chair;with call bell/phone within reach Nurse Communication: Mobility status PT Visit Diagnosis: Difficulty in walking, not elsewhere classified (R26.2)    Time: 7628-3151 PT Time Calculation (min) (ACUTE ONLY): 10 min   Charges:   PT Evaluation $PT Eval Low Complexity: 1 Low     Kati PT, DPT Acute Rehabilitation Services Office: [phone]  Rashi Granier,[name] 08/27/2019, 11:17 AM

## 2019-08-27 NOTE — Discharge Summary (Signed)
Physician Discharge Summary  Monique Holland TGY:[id number] DOB: 1965/09/11   PCP: Knox Royalty, MD  Admit date: 08/16/2019 Discharge date: 08/27/2019 Length of Stay: 10 days   Code Status: Full Code  Admitted From:  Home Discharged to:   Home Home Health:  None  Equipment/Devices:  None Discharge Condition:  Stable  Recommendations for Outpatient Follow-up   1. Follow up with PCP in 1 week 2. Follow up CMP/CBC  3. To follow-up with GI on 09/05/2019 at [facility] for scheduled outpatient vasectomy 4. Started on Augmentin twice daily x21 days total for MSSA and pancreatic pseudocyst and Diflucan 200 mg daily x14 days total for Candida esophagitis. 5. Patient's Latuda was decreased from 60 to 40 mg while on fluconazole due to medication interaction and can return to her usual dose at completion of this therapy 6. Monitor for QT prolongation on her usual psych meds  Hospital Summary  54 year old with past medical history significant for ID DM2, hypertension, gastritis, chronic pancreatitis with pseudocyst, morbid obesity who presents complaining of abdominal pain and vomiting a small amount of blood.  -Patient last hospitalization from 08/02/2019 130 07/1618 21 for epigastric abdominal pain nausea vomiting thought to be secondary to gastritis esophagitis. Renal admission she had an MRCP which showed 12.6 cm pseudocyst, unchanged from recent CT scan. 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  Patient admitted with recurrent vomiting. Underwent endoscopy which was normal except for Mild to moderate extrinsic compression in the region of duodenal bulb from Pancreatic pseudocyst.  3/31: EGD/EUS with Dr. Meridee Score -acquired extrinsic deformity and posterior wall of stomach,  erythematous mucosa of gastric body and antrum.  85 x 84 mm cyst walled off necrosis in peripancreatic region consistent with pancreatic pseudocyst and necrosis with 1 enlarged lymph node.  Further imaging ordered by GI for further evaluation to evaluate for potential pseudoaneurysm - no pseudoaneurysm on imaging  4/2: Repeat EUS - cystic lesion in peripancreatic region consistent with pseudocyst with walled off necrosis. Cystogastrostomy created. Started on Cipro  4/5 S/P endoscopic necrosectomy  with debridement , and placement of 2 pigtail stents, medium amt of remaining necrotic tissue and also candida esophagitis.  Started on Diflucan 400 mg x 1 day followed by 200 mg daily to complete 14 days. Cyst culture- multiple species occluding MSSA, non aereboic -Augmentin twice daily x21 days started  Tolerating p.o. intake with significant improvement in pain. Plan for necrosectomy with Dr. Meridee Score on 4/15 outpatient   A & P   Active Problems:   AKI (acute kidney injury) (HCC)   Type 2 diabetes mellitus (HCC)   Schizophrenia (HCC)   Obesity, Class III, BMI 40-49.9 (morbid obesity) (HCC)   Acute pancreatitis   Pancreatic pseudocyst   Abdominal pain   Essential hypertension   Non-intractable vomiting   Hematemesis with nausea    1. Acute on chronic abdominal pain, chronic pancreatitis, pancreatic pseudocyst with MSSA and culture and Candida esophagitis 1. Tolerating p.o. intake with significant improvement in pain. 2. Plan for necrosectomy with Dr. Meridee Score on 4/15 outpatient 3. Diflucan 200 mg p.o. daily for total 14 days antifungals 4. Augmentin twice daily for total 21 days for MSSA positive culture   2. Incidental Splenic Vein Occlusion Noted on CT angio a. Discussed with vascular surgery.  Patient's abdominal pain is likely from pseudocyst and chronic pancreatitis and splenic  vein occlusion is likely incidental asymptomatic finding.  Will hold off on anticoagulation at this  time however if patient becomes symptomatic in the future and/or there is interval progression with recommend starting anticoagulation at that time  3. Hematemesis/persistent vomiting from #1 a. Resolved  4. Diarrhea, resolved  5. Hypokalemia, repleted  6. AKI, resolved  7. Type 2 diabetes continue home meds  8. Asthma, stable on room air  9. Hypertension Continue home medications  10. Anxiety/depression/ Schizophrenia BPD  a. Continue trazodone, amitriptyline b. Latuda decreased to 40 mg daily while on fluconazole due to medication interaction and can return to usual dose once completion of therapy    Consultants  . GI  Procedures  . 3/31 EGD/EUS . 4/2 repeat EUS . 4/5 status post endoscopic necrosectomy with debridement placement of pigtail stent to  Antibiotics   Anti-infectives (From admission, onward)   Start     Dose/Rate Route Frequency Ordered Stop   08/28/19 0000  fluconazole (DIFLUCAN) 200 MG tablet     200 mg Oral Daily 08/27/19 1635 09/09/19 2359   08/27/19 1200  amoxicillin-clavulanate (AUGMENTIN) 875-125 MG per tablet 1 tablet     1 tablet Oral Every 12 hours 08/27/19 1000 09/17/19 0959   08/27/19 1000  fluconazole (DIFLUCAN) tablet 200 mg     200 mg Oral Daily 08/26/19 1650 09/09/19 0959   08/27/19 0000  amoxicillin-clavulanate (AUGMENTIN) 875-125 MG tablet     1 tablet Oral Every 12 hours 08/27/19 1635 09/17/19 2359   08/26/19 1800  fluconazole (DIFLUCAN) tablet 400 mg     400 mg Oral  Once 08/26/19 1650 08/26/19 1801   08/24/19 0800  ciprofloxacin (CIPRO) IVPB 400 mg  Status:  Discontinued     400 mg 200 mL/hr over 60 Minutes Intravenous Every 12 hours 08/24/19 0732 08/27/19 1000   08/23/19 1800  ciprofloxacin (CIPRO) IVPB 400 mg  Status:  Discontinued     400 mg 200 mL/hr over 60 Minutes Intravenous Every 12 hours 08/23/19 0952 08/23/19 1146   08/23/19 1400  piperacillin-tazobactam (ZOSYN) IVPB 3.375 g  Status:  Discontinued     3.375  g 12.5 mL/hr over 240 Minutes Intravenous Every 8 hours 08/23/19 1147 08/24/19 0720       Subjective  Patient seen and examined at bedside no acute distress and resting comfortably.  No events overnight.  Tolerating diet. In good spirits and anticipating discharge.   Denies any chest pain, shortness of breath, fever, nausea, vomiting, urinary or bowel complaints. Otherwise ROS negative    Objective   Discharge Exam: Vitals:   08/27/19 0649 08/27/19 1351  BP: (!) 164/81 130/80  Pulse: 97 97  Resp: 18 18  Temp: 98 F (36.7 C) 99.1 F (37.3 C)  SpO2: 97% 100%   Vitals:   08/26/19 2122 08/27/19 0124 08/27/19 0649 08/27/19 1351  BP: (!) 141/89 122/63 (!) 164/81 130/80  Pulse: (!) 103 94 97 97  Resp: 18 18 18 18   Temp: 98.7 F (37.1 C) 98.1 F (36.7 C) 98 F (36.7 C) 99.1 F (37.3 C)  TempSrc: Oral Oral Oral Oral  SpO2: 97% 96% 97% 100%  Weight:      Height:        Physical Exam Vitals and nursing note reviewed.  Constitutional:      Appearance: Normal appearance.  HENT:     Head: Normocephalic and atraumatic.  Eyes:     Conjunctiva/sclera: Conjunctivae normal.  Cardiovascular:     Rate and Rhythm: Normal rate  and regular rhythm.  Pulmonary:     Effort: Pulmonary effort is normal.     Breath sounds: Normal breath sounds.  Abdominal:     General: Abdomen is flat.     Palpations: Abdomen is soft.  Musculoskeletal:        General: No swelling or tenderness.  Skin:    Coloration: Skin is not jaundiced or pale.  Neurological:     Mental Status: She is alert. Mental status is at baseline.  Psychiatric:        Mood and Affect: Mood normal.        Behavior: Behavior normal.       The results of significant diagnostics from this hospitalization (including imaging, microbiology, ancillary and laboratory) are listed below for reference.     Microbiology: Recent Results (from the past 240 hour(s))  Aerobic/Anaerobic Culture (surgical/deep wound)     Status:  None (Preliminary result)   Collection Time: 08/23/19  8:11 AM   Specimen: PATH GI biopsy; Tissue  Result Value Ref Range Status   Specimen Description   Final    CYSTS PANCREATIC PSEUDOCYST ASPIRATE Performed at [facility], [address], [place], [place] [address]    Special Requests   Final    NONE Performed at [facility], [address] [place], [place] [address]    Gram Stain   Final    NO WBC SEEN NO ORGANISMS SEEN Performed at [facility], [address] [place], [place] [address]    Culture   Final    FEW STAPHYLOCOCCUS AUREUS SUSCEPTIBILITIES TO FOLLOW WITH IN MIXED CULTURE NO ANAEROBES ISOLATED; CULTURE IN PROGRESS FOR 5 DAYS    Report Status PENDING  Incomplete     Labs: BNP (last 3 results) No results for input(s): BNP in the last 8760 hours. Basic Metabolic Panel: Recent Labs  Lab 08/22/19 0422 08/23/19 0426 08/24/19 0446 08/26/19 0458 08/27/19 0750  NA 138 139 137 137 136  K 3.4* 3.8 3.6 3.5 4.1  CL 106 106 103 104 101  CO2 24 25 26 24 23   GLUCOSE 191* 233* 253* 274* 412*  BUN 6 5* <5* 7 11  CREATININE 0.61 0.70 0.67 0.69 0.75  CALCIUM 8.5* 8.6* 8.6* 8.9 9.5   Liver Function Tests: Recent Labs  Lab 08/22/19 0422 08/24/19 0446 08/27/19 0750  AST 17 18 19   ALT 17 15 16   ALKPHOS 56 61 69  BILITOT 0.5 0.4 0.4  PROT 6.1* 6.2* 6.8  ALBUMIN 3.1* 3.3* 3.4*   No results for input(s): LIPASE, AMYLASE in the last 168 hours. No results for input(s): AMMONIA in the last 168 hours. CBC: Recent Labs  Lab 08/22/19 0422 08/23/19 0426 08/24/19 0446 08/26/19 0458 08/27/19 0750  WBC 5.4 5.0 7.7 6.0 6.1  HGB 9.6* 9.3* 10.0* 9.7* 10.4*  HCT 33.9* 32.7* 34.0* 33.8* 35.5*  MCV 78.1* 77.3* 77.3* 77.3* 76.2*  PLT 221 218 240 207 250   Cardiac Enzymes: No results for input(s): CKTOTAL, CKMB, CKMBINDEX, TROPONINI in the last 168 hours. BNP: Invalid input(s): POCBNP CBG: Recent Labs  Lab  08/26/19 1805 08/26/19 2129 08/26/19 2132 08/27/19 0819 08/27/19 1159  GLUCAP 289* 483* 453* 360* 324*   D-Dimer No results for input(s): DDIMER in the last 72 hours. Hgb A1c No results for input(s): HGBA1C in the last 72 hours. Lipid Profile No results for input(s): CHOL, HDL, LDLCALC, TRIG, CHOLHDL, LDLDIRECT in the last 72 hours. Thyroid function studies No results for  input(s): TSH, T4TOTAL, T3FREE, THYROIDAB in the last 72 hours.  Invalid input(s): FREET3 Anemia work up No results for input(s): VITAMINB12, FOLATE, FERRITIN, TIBC, IRON, RETICCTPCT in the last 72 hours. Urinalysis    Component Value Date/Time   COLORURINE YELLOW 08/16/2019 1942   APPEARANCEUR CLEAR 08/16/2019 1942   LABSPEC 1.013 08/16/2019 1942   PHURINE 7.0 08/16/2019 1942   GLUCOSEU >=500 (A) 08/16/2019 1942   HGBUR NEGATIVE 08/16/2019 1942   BILIRUBINUR NEGATIVE 08/16/2019 1942   KETONESUR NEGATIVE 08/16/2019 1942   PROTEINUR NEGATIVE 08/16/2019 1942   UROBILINOGEN 0.2 02/12/2015 1348   NITRITE NEGATIVE 08/16/2019 1942   LEUKOCYTESUR NEGATIVE 08/16/2019 1942   Sepsis Labs Invalid input(s): PROCALCITONIN,  WBC,  LACTICIDVEN Microbiology Recent Results (from the past 240 hour(s))  Aerobic/Anaerobic Culture (surgical/deep wound)     Status: None (Preliminary result)   Collection Time: 08/23/19  8:11 AM   Specimen: PATH GI biopsy; Tissue  Result Value Ref Range Status   Specimen Description   Final    CYSTS PANCREATIC PSEUDOCYST ASPIRATE Performed at [facility], [address], [place], [place] [address]    Special Requests   Final    NONE Performed at [facility], [address] [place], [place] [address]    Gram Stain   Final    NO WBC SEEN NO ORGANISMS SEEN Performed at [facility], [address], [place], [place] [address]    Culture   Final    FEW STAPHYLOCOCCUS AUREUS SUSCEPTIBILITIES TO FOLLOW WITH IN MIXED CULTURE NO ANAEROBES  ISOLATED; CULTURE IN PROGRESS FOR 5 DAYS    Report Status PENDING  Incomplete    Discharge Instructions     Discharge Instructions    Diet - low sodium heart healthy   Complete by: As directed    Discharge instructions   Complete by: As directed    You were seen and examined in the hospital for pancreatitis and cared for by a hospitalist and GI doctor  Upon Discharge:  -Take Augmentin twice daily for the next 20 days -Take fluconazole 200 mg daily for the next 12 days -Stop taking your Latuda 60 mg for the next 12 days and instead take Latuda 40 mg while on fluconazole.  Once you complete your fluconazole you can start taking Latuda 60 mg again -Follow-up with GI as scheduled Make an appointment with your primary care physician within 7 days Get lab work prior to your follow up appointment with your PCP Bring all home medications to your appointment to review Request that your primary physician go over all hospital tests and procedures/radiological results at the follow up.   Please get all hospital records sent to your physician by signing a hospital release before you go home.   Read the complete instructions along with all the possible side effects for all the medicines you take and that have been prescribed to you. Take any new medicines after you have completely understood and accept all the possible adverse reactions/side effects.   If you have any questions about your discharge medications or the care you received while you were in the hospital, you can call the unit and asked to speak with the hospitalist on call. Once you are discharged, your primary care physician will handle any further medical issues. Please note that NO REFILLS for any discharge medications will be authorized, as it is imperative that you return to your primary care physician (or establish a relationship with a primary care  physician if you do not have one) for your aftercare needs so that they can  reassess your need for medications and monitor your lab values.   Do not drive, operate heavy machinery, perform activities at heights, swimming or participation in water activities or provide baby sitting services if your were admitted for loss of consciousness/seizures or if you are on sedating medications including, but not limited to benzodiazepines, sleep medications, narcotic pain medications, etc., until you have been cleared to do so by a medical doctor.   Do not take more than prescribed medications.   Wear a seat belt while driving.  If you have smoked or chewed Tobacco in the last 2 years please stop smoking; also stop any regular Alcohol and/or any Recreational drug use including marijuana.  If you experience worsening of your admission symptoms or develop shortness of breath, chest pain, suicidal or homicidal thoughts or experience a life threatening emergency, you must seek medical attention immediately by calling 911 or calling your PCP immediately.   Increase activity slowly   Complete by: As directed    Increase activity slowly   Complete by: As directed      Allergies as of 08/27/2019   No Known Allergies     Medication List    STOP taking these medications   lactulose 10 GM/15ML solution Commonly known as: CHRONULAC   lisinopril-hydrochlorothiazide 20-12.5 MG tablet Commonly known as: ZESTORETIC   pantoprazole 40 MG tablet Commonly known as: Protonix     TAKE these medications   acetaminophen 325 MG tablet Commonly known as: TYLENOL Take 2 tablets (650 mg total) by mouth every 6 (six) hours as needed for mild pain (or Fever >/= 101).   albuterol 108 (90 Base) MCG/ACT inhaler Commonly known as: Ventolin HFA Inhale 1-2 puffs into the lungs every 6 (six) hours as needed for wheezing or shortness of breath.   amitriptyline 75 MG tablet Commonly known as: ELAVIL Take 75 mg by mouth at bedtime.   amLODipine 10 MG tablet Commonly known as: NORVASC Take 10  mg by mouth daily.   amoxicillin-clavulanate 875-125 MG tablet Commonly known as: AUGMENTIN Take 1 tablet by mouth every 12 (twelve) hours for 21 days.   clonazePAM 0.5 MG tablet Commonly known as: KLONOPIN Take 0.5 mg by mouth daily as needed for anxiety.   fluconazole 200 MG tablet Commonly known as: DIFLUCAN Take 1 tablet (200 mg total) by mouth daily for 12 days. Start taking on: August 28, 2019   lamoTRIgine 200 MG tablet Commonly known as: LAMICTAL Take 200 mg by mouth daily.   Lantus SoloStar 100 UNIT/ML Solostar Pen Generic drug: insulin glargine Inject 15 Units into the skin at bedtime. What changed: how much to take   Latuda 60 MG Tabs Generic drug: Lurasidone HCl Take 1 tablet by mouth at bedtime.   lurasidone 40 MG Tabs tablet Commonly known as: LATUDA Take 1 tablet (40 mg total) by mouth at bedtime for 12 days.   metoprolol tartrate 25 MG tablet Commonly known as: LOPRESSOR Take 1 tablet (25 mg total) by mouth 2 (two) times daily. What changed: how much to take   ondansetron 8 MG tablet Commonly known as: ZOFRAN Take 1 tablet (8 mg total) by mouth every 8 (eight) hours as needed for nausea or vomiting.   Oxycodone HCl 10 MG Tabs Take 10 mg by mouth 4 (four) times daily as needed (pain). What changed: Another medication with the same name was removed. Continue taking this medication, and  follow the directions you see here.   Premarin vaginal cream Generic drug: conjugated estrogens Place 1 Applicatorful vaginally at bedtime.   senna 8.6 MG Tabs tablet Commonly known as: SENOKOT Take 1 tablet (8.6 mg total) by mouth 2 (two) times daily.   Symbicort 160-4.5 MCG/ACT inhaler Generic drug: budesonide-formoterol Inhale 2 puffs into the lungs 2 (two) times daily as needed (SOB/wheezing).   traZODone 100 MG tablet Commonly known as: DESYREL Take 100 mg by mouth at bedtime.   Trulicity 4.5 MG/0.5ML Sopn Generic drug: Dulaglutide Inject 4.5 mg into the  skin once a week. Take on Wed       No Known Allergies  Time coordinating discharge: Over 30 minutes   SIGNED:   Jae Dire, D.O. [facility] Pager: [phone]  08/27/2019, 4:44 PM

## 2019-08-27 NOTE — Discharge Instructions (Signed)
You are scheduled for follow-up procedure with Dr. Meridee Score on April 15 at 7:30 AM at [facility] endoscopy.  You will need to arrive at least 1 hour before your procedure.  Nothing to eat or drink after midnight the night before.  Our office will call you this week with details of your upcoming procedure.  For any questions please call Dr. Meridee Score 's office at [phone]

## 2019-08-27 NOTE — Progress Notes (Signed)
Patient ID: Monique Holland, female   DOB: 1965/08/23, 54 y.o.   MRN: [id number]    Progress Note   Subjective  Day # 10  CC; Pancreatic pseudocyst  S/P endoscopic necrosectomy  Yesterday   Via cyst gastrostomy- 60 min of debridement , and placement of 2 pigtail stents, medium amt of remaining necrotic tissue Also candida esophagitis  Cyst culture- multiple species, non aereboic -Augmentin started   Labs today -wbc 6.1, hgb 10.4, gluc 412  Patient feels good today, denies any increased discomfort post procedure yesterday.  She says she had a couple of sharp pains last night and took pain medicine once but has not required any since.  She denies any nausea, has been afebrile.  She is eating solid food without difficulty, and does not like low-fat diet. She is hoping she can be discharged today       Objective   Vital signs in last 24 hours: Temp:  [97.3 F (36.3 C)-99.7 F (37.6 C)] 98 F (36.7 C) (04/06 0649) Pulse Rate:  [92-110] 97 (04/06 0649) Resp:  [13-93] 18 (04/06 0649) BP: (122-164)/(63-100) 164/81 (04/06 0649) SpO2:  [91 %-100 %] 97 % (04/06 0649) Weight:  [135.6 kg] 135.6 kg (04/05 1309) Last BM Date: 08/26/19 General:    AA  female in NAD, pleasant ,obese Heart:  Regular rate and rhythm; no murmurs Lungs: Respirations even and unlabored, lungs CTA bilaterally Abdomen:  Soft,obese , mild tenderness  across upper abdomen, and nondistended. Normal bowel sounds. Extremities:  Without edema. Neurologic:  Alert and oriented,  grossly normal neurologically. Psych:  Cooperative. Normal mood and affect.  Intake/Output from previous day: 04/05 0701 - 04/06 0700 In: 2300.8 [P.O.:1200; I.V.:700.8; IV Piggyback:400] Out: -  Intake/Output this shift: No intake/output data recorded.  Lab Results: Recent Labs    08/26/19 0458 08/27/19 0750  WBC 6.0 6.1  HGB 9.7* 10.4*  HCT 33.8* 35.5*  PLT 207 250   BMET Recent Labs    08/26/19 0458 08/27/19 0750  NA 137  136  K 3.5 4.1  CL 104 101  CO2 24 23  GLUCOSE 274* 412*  BUN 7 11  CREATININE 0.69 0.75  CALCIUM 8.9 9.5   LFT Recent Labs    08/27/19 0750  PROT 6.8  ALBUMIN 3.4*  AST 19  ALT 16  ALKPHOS 69  BILITOT 0.4   PT/INR No results for input(s): LABPROT, INR in the last 72 hours.  Studies/Results: No results found.     Assessment / Plan:    #20  54 year old African-American female, with recent episode of severe necrotizing pancreatitis now readmitted with nausea vomiting abdominal pain and necrotic pseudocyst.  She is status post endoscopic necrosectomy yesterday with good result.  2 pigtail stents placed in the cyst gastrostomy . Dr. Meridee Score plans repeat procedure to remove residual necrotic tissue either next week or the following.  This can be done as an outpatient.  Plan; low-fat carb modified diet Continue Augmentin 875 twice daily x21 days Diflucan 200 mg p.o. daily for total of 14-day course for Candida esophagitis which is asymptomatic.  Patient can be discharged home today, she is advised to call our office for any problems with development of new abdominal pain nausea vomiting or fever.  We will get her scheduled for outpatient necrosectomy on 09/05/2019 at 7:30 AM at [facility] endoscopy with Dr. Meridee Score , and will call her with details, later this week.     Active Problems:   AKI (acute kidney injury) (  [place])   Type 2 diabetes mellitus ([place])   Schizophrenia ([place])   Obesity, Class III, BMI 40-49.9 (morbid obesity) ([place])   Acute pancreatitis   Pancreatic pseudocyst   Abdominal pain   Essential hypertension   Non-intractable vomiting   Hematemesis with nausea     LOS: 10 days   Chauncy Mangiaracina EsterwoodPA-C  08/27/2019, 8:33 AM

## 2019-08-27 NOTE — Progress Notes (Signed)
Discharge instructions discussed with patient and family, verbalized agreement and understanding 

## 2019-08-28 LAB — AEROBIC/ANAEROBIC CULTURE W GRAM STAIN (SURGICAL/DEEP WOUND): Gram Stain: NONE SEEN

## 2019-08-29 ENCOUNTER — Telehealth: Payer: Self-pay | Admitting: Gastroenterology

## 2019-08-29 NOTE — Telephone Encounter (Signed)
Patty, This patient had a cyst gastrostomy performed during her recent hospitalization. She also had a necrosectomy. She has been tentatively placed for a necrosectomy next Thursday at [facility] in this request is already in place. Can you reach out to the patient tomorrow and work on arranging any Covid testing that may be required before her procedure next Thursday? Please let me know if there are any questions. Thanks. GM

## 2019-08-30 NOTE — Telephone Encounter (Signed)
09/02/19 at 140 pm appt for COVID pt has been advised

## 2019-08-30 NOTE — Telephone Encounter (Signed)
Error voice mail full

## 2019-09-02 ENCOUNTER — Other Ambulatory Visit (HOSPITAL_COMMUNITY): Payer: Medicare Other

## 2019-09-02 NOTE — Telephone Encounter (Signed)
Spoke with the husband and he is going to try and reach the pt and have her call us ASAP.

## 2019-09-02 NOTE — Telephone Encounter (Signed)
Mobile phone no voice mail set up and no answer.

## 2019-09-02 NOTE — Telephone Encounter (Signed)
Patty, I was able to get a hold of the patient's sons on the additional contacts during her recent hospitalization. Please try those or her Husband's number. Gae Bon, she is scheduled to see you tomorrow so if she comes into clinic please remind her and make sure Alexia Freestone gets her set up for the Necrosectomy this Thursday, if she hasn't gotten a hold of her by then. Thanks. GM

## 2019-09-02 NOTE — Telephone Encounter (Signed)
Tried again to call pt and voice mail is full

## 2019-09-02 NOTE — Telephone Encounter (Signed)
Patient is returning your call needs to reschedule covid test

## 2019-09-02 NOTE — Telephone Encounter (Signed)
Dr Meridee Score I have tried to reach the pt at all available numbers and no answer with no voice mail or mailbox full.

## 2019-09-02 NOTE — Telephone Encounter (Signed)
Called spouses number and pt was not available and number provided to me was not working.  I called the son and left a message for him to return call.

## 2019-09-03 ENCOUNTER — Other Ambulatory Visit (HOSPITAL_COMMUNITY): Payer: Medicare Other

## 2019-09-03 ENCOUNTER — Ambulatory Visit: Payer: 59 | Admitting: Gastroenterology

## 2019-09-03 ENCOUNTER — Other Ambulatory Visit (HOSPITAL_COMMUNITY)
Admission: RE | Admit: 2019-09-03 | Discharge: 2019-09-03 | Disposition: A | Discharge status: Home / Self Care | Payer: Medicare Other | Source: Ambulatory Visit | Attending: Gastroenterology | Admitting: Gastroenterology

## 2019-09-03 DIAGNOSIS — Z20822 Contact with and (suspected) exposure to covid-19: Secondary | ICD-10-CM | POA: Insufficient documentation

## 2019-09-03 DIAGNOSIS — Z01812 Encounter for preprocedural laboratory examination: Secondary | ICD-10-CM | POA: Diagnosis present

## 2019-09-03 LAB — SARS CORONAVIRUS 2 (TAT 6-24 HRS): SARS Coronavirus 2: NEGATIVE

## 2019-09-03 NOTE — Telephone Encounter (Signed)
The pt called and will go to get her COVID this afternoon by 2 pm .

## 2019-09-03 NOTE — Telephone Encounter (Signed)
Tried to reach the pt and voice mail is full.  Called and spoke with the husband and advised him that if the pt does not have COVID test ASAP today her case would be cancelled. He states he will call and let her know.

## 2019-09-04 ENCOUNTER — Other Ambulatory Visit: Payer: Self-pay

## 2019-09-04 ENCOUNTER — Encounter (HOSPITAL_COMMUNITY): Payer: Self-pay | Admitting: Gastroenterology

## 2019-09-04 DIAGNOSIS — K8591 Acute pancreatitis with uninfected necrosis, unspecified: Secondary | ICD-10-CM

## 2019-09-04 NOTE — Anesthesia Preprocedure Evaluation (Addendum)
Anesthesia Evaluation  Patient identified by MRN, date of birth, ID band Patient awake    Reviewed: Allergy & Precautions, NPO status , Patient's Chart, lab work & pertinent test results, reviewed documented beta blocker date and time   Airway Mallampati: III  TM Distance: >3 FB Neck ROM: Full    Dental  (+) Dental Advisory Given, Loose, Poor Dentition,    Pulmonary asthma , sleep apnea ,  Non-compliant with cpap   breath sounds clear to auscultation       Cardiovascular hypertension, Pt. on medications and Pt. on home beta blockers  Rhythm:Regular Rate:Normal     Neuro/Psych PSYCHIATRIC DISORDERS Anxiety Depression Bipolar Disorder Schizophrenia negative neurological ROS     GI/Hepatic Neg liver ROS, GERD  ,Necrotic pancreatic pseudocyst   Endo/Other  diabetes, Type 2, Insulin DependentMorbid obesityPancreatic necrosis BMI 45 Took 1/2 normal dose of insulin last night, no insulin this AM  Renal/GU negative Renal ROS  negative genitourinary   Musculoskeletal negative musculoskeletal ROS (+)   Abdominal (+) + obese,   Peds  Hematology  (+) Blood dyscrasia, anemia , hct 35.5   Anesthesia Other Findings   Reproductive/Obstetrics negative OB ROS                            Anesthesia Physical Anesthesia Plan  ASA: III  Anesthesia Plan: General   Post-op Pain Management:    Induction: Intravenous and Rapid sequence  PONV Risk Score and Plan: 3 and Ondansetron, Dexamethasone, Midazolam and Treatment may vary due to age or medical condition  Airway Management Planned: Oral ETT and Video Laryngoscope Planned  Additional Equipment: None  Intra-op Plan:   Post-operative Plan: Extubation in OR  Informed Consent: I have reviewed the patients History and Physical, chart, labs and discussed the procedure including the risks, benefits and alternatives for the proposed anesthesia with the  patient or authorized representative who has indicated his/her understanding and acceptance.     Dental advisory given  Plan Discussed with: CRNA  Anesthesia Plan Comments:        Anesthesia Quick Evaluation

## 2019-09-04 NOTE — Progress Notes (Signed)
Pt denies SOB, chest pain, and being under the care of a cardiologist. Pt stated that PCP is Dr. Knox Royalty. Pt made aware to stop taking Aspirin (unless otherwise advised by surgeon), vitamins, fish oil and herbal medications. Do not take any NSAIDs ie: Ibuprofen, Advil, Naproxen (Aleve), Motrin, BC and Goody Powder. Pt made aware to take 50% of Lantus insulin tonight ( 15 units). Pt made aware to check CBG every 2 hours prior to arrival to hospital on DOS. Pt made aware to treat a CBG < 70 with  4 ounces of apple juice, wait 15 minutes after intervention to recheck CBG, if CBG remains < 70, call the Endoscopy unit to speak with a nurse. Pt reminded to quarantine. Pt verbalized understanding of all pre-op instructions.

## 2019-09-05 ENCOUNTER — Encounter (HOSPITAL_COMMUNITY): Payer: Self-pay | Admitting: Gastroenterology

## 2019-09-05 ENCOUNTER — Ambulatory Visit (HOSPITAL_COMMUNITY): Payer: Medicare Other | Admitting: Anesthesiology

## 2019-09-05 ENCOUNTER — Encounter (HOSPITAL_COMMUNITY)
Admission: RE | Disposition: A | Discharge status: Home / Self Care | Payer: Self-pay | Source: Home / Self Care | Attending: Gastroenterology

## 2019-09-05 ENCOUNTER — Ambulatory Visit (HOSPITAL_COMMUNITY)
Admission: RE | Admit: 2019-09-05 | Discharge: 2019-09-05 | Disposition: A | Discharge status: Home / Self Care | Payer: Medicare Other | Attending: Gastroenterology | Admitting: Gastroenterology

## 2019-09-05 DIAGNOSIS — Z6841 Body Mass Index (BMI) 40.0 and over, adult: Secondary | ICD-10-CM | POA: Insufficient documentation

## 2019-09-05 DIAGNOSIS — J45909 Unspecified asthma, uncomplicated: Secondary | ICD-10-CM | POA: Diagnosis not present

## 2019-09-05 DIAGNOSIS — I10 Essential (primary) hypertension: Secondary | ICD-10-CM | POA: Diagnosis not present

## 2019-09-05 DIAGNOSIS — F209 Schizophrenia, unspecified: Secondary | ICD-10-CM | POA: Insufficient documentation

## 2019-09-05 DIAGNOSIS — G4733 Obstructive sleep apnea (adult) (pediatric): Secondary | ICD-10-CM | POA: Diagnosis not present

## 2019-09-05 DIAGNOSIS — R634 Abnormal weight loss: Secondary | ICD-10-CM | POA: Insufficient documentation

## 2019-09-05 DIAGNOSIS — K863 Pseudocyst of pancreas: Secondary | ICD-10-CM | POA: Diagnosis not present

## 2019-09-05 DIAGNOSIS — E119 Type 2 diabetes mellitus without complications: Secondary | ICD-10-CM | POA: Insufficient documentation

## 2019-09-05 DIAGNOSIS — K259 Gastric ulcer, unspecified as acute or chronic, without hemorrhage or perforation: Secondary | ICD-10-CM | POA: Insufficient documentation

## 2019-09-05 DIAGNOSIS — K219 Gastro-esophageal reflux disease without esophagitis: Secondary | ICD-10-CM | POA: Insufficient documentation

## 2019-09-05 DIAGNOSIS — F319 Bipolar disorder, unspecified: Secondary | ICD-10-CM | POA: Diagnosis not present

## 2019-09-05 DIAGNOSIS — K8689 Other specified diseases of pancreas: Secondary | ICD-10-CM | POA: Diagnosis present

## 2019-09-05 HISTORY — PX: STENT REMOVAL: SHX6421

## 2019-09-05 HISTORY — PX: PANCREATIC STENT PLACEMENT: SHX5539

## 2019-09-05 HISTORY — DX: Bipolar disorder, unspecified: F31.9

## 2019-09-05 HISTORY — PX: ENDOROTOR: SHX6859

## 2019-09-05 HISTORY — PX: FOREIGN BODY REMOVAL: SHX962

## 2019-09-05 HISTORY — DX: Pseudocyst of pancreas: K86.3

## 2019-09-05 HISTORY — PX: ESOPHAGOGASTRODUODENOSCOPY (EGD) WITH PROPOFOL: SHX5813

## 2019-09-05 LAB — GLUCOSE, CAPILLARY
Glucose-Capillary: 428 mg/dL — ABNORMAL HIGH (ref 70–99)
Glucose-Capillary: 459 mg/dL — ABNORMAL HIGH (ref 70–99)
Glucose-Capillary: 389 mg/dL — ABNORMAL HIGH (ref 70–99)
Glucose-Capillary: 268 mg/dL — ABNORMAL HIGH (ref 70–99)

## 2019-09-05 SURGERY — ESOPHAGOGASTRODUODENOSCOPY (EGD) WITH PROPOFOL
Anesthesia: General

## 2019-09-05 MED ORDER — CIPROFLOXACIN HCL 500 MG PO TABS: 500.0000 mg | ORAL_TABLET | Freq: Two times a day (BID) | ORAL | 0 refills | Status: DC

## 2019-09-05 MED ORDER — SUGAMMADEX SODIUM 200 MG/2ML IV SOLN
INTRAVENOUS | Status: DC | PRN
  Administered 2019-09-05: 400 mg via INTRAVENOUS

## 2019-09-05 MED ORDER — LIDOCAINE 2% (20 MG/ML) 5 ML SYRINGE
INTRAMUSCULAR | Status: DC | PRN
  Administered 2019-09-05: 60 mg via INTRAVENOUS

## 2019-09-05 MED ORDER — ROCURONIUM BROMIDE 10 MG/ML (PF) SYRINGE
PREFILLED_SYRINGE | INTRAVENOUS | Status: DC | PRN
  Administered 2019-09-05: 30 mg via INTRAVENOUS
  Administered 2019-09-05: 20 mg via INTRAVENOUS
  Administered 2019-09-05: 50 mg via INTRAVENOUS

## 2019-09-05 MED ORDER — SUCCINYLCHOLINE CHLORIDE 20 MG/ML IJ SOLN
INTRAMUSCULAR | Status: DC | PRN
  Administered 2019-09-05: 140 mg via INTRAVENOUS

## 2019-09-05 MED ORDER — PROPOFOL 10 MG/ML IV BOLUS
INTRAVENOUS | Status: DC | PRN
  Administered 2019-09-05: 200 mg via INTRAVENOUS

## 2019-09-05 MED ORDER — ESMOLOL HCL 100 MG/10ML IV SOLN
INTRAVENOUS | Status: DC | PRN
  Administered 2019-09-05 (×3): 20 mg via INTRAVENOUS

## 2019-09-05 MED ORDER — FENTANYL CITRATE (PF) 100 MCG/2ML IJ SOLN
INTRAMUSCULAR | Status: DC | PRN
  Administered 2019-09-05 (×2): 50 ug via INTRAVENOUS

## 2019-09-05 MED ORDER — INSULIN ASPART 100 UNIT/ML ~~LOC~~ SOLN
SUBCUTANEOUS | Status: DC | PRN
  Administered 2019-09-05: 10 [IU] via SUBCUTANEOUS

## 2019-09-05 MED ORDER — INSULIN REGULAR(HUMAN) IN NACL 100-0.9 UT/100ML-% IV SOLN
INTRAVENOUS | Status: DC | PRN
  Administered 2019-09-05: 5 [IU]/h via INTRAVENOUS

## 2019-09-05 MED ORDER — CIPROFLOXACIN IN D5W 400 MG/200ML IV SOLN
INTRAVENOUS | Status: AC
  Filled 2019-09-05: qty 200

## 2019-09-05 MED ORDER — CIPROFLOXACIN IN D5W 400 MG/200ML IV SOLN
400.0000 mg | INTRAVENOUS | Status: AC
  Administered 2019-09-05: 400 mg via INTRAVENOUS

## 2019-09-05 MED ORDER — LACTATED RINGERS IV SOLN
INTRAVENOUS | Status: AC | PRN
  Administered 2019-09-05: 1000 mL via INTRAVENOUS

## 2019-09-05 MED ORDER — ONDANSETRON HCL 4 MG/2ML IJ SOLN
INTRAMUSCULAR | Status: DC | PRN
  Administered 2019-09-05: 4 mg via INTRAVENOUS

## 2019-09-05 MED ORDER — PHENYLEPHRINE 40 MCG/ML (10ML) SYRINGE FOR IV PUSH (FOR BLOOD PRESSURE SUPPORT)
PREFILLED_SYRINGE | INTRAVENOUS | Status: DC | PRN
  Administered 2019-09-05 (×2): 80 ug via INTRAVENOUS
  Administered 2019-09-05: 160 ug via INTRAVENOUS
  Administered 2019-09-05 (×2): 80 ug via INTRAVENOUS
  Administered 2019-09-05: 120 ug via INTRAVENOUS
  Administered 2019-09-05: 80 ug via INTRAVENOUS

## 2019-09-05 SURGICAL SUPPLY — 15 items

## 2019-09-05 NOTE — Transfer of Care (Signed)
Immediate Anesthesia Transfer of Care Note  Patient: Monique Holland  Procedure(s) Performed: ESOPHAGOGASTRODUODENOSCOPY (EGD) WITH PROPOFOL (N/A ) ENDOROTOR FOREIGN BODY REMOVAL STENT REMOVAL PANCREATIC STENT PLACEMENT  Patient Location: Endoscopy Unit  Anesthesia Type:General  Level of Consciousness: awake and oriented  Airway & Oxygen Therapy: Patient Spontanous Breathing  Post-op Assessment: Report given to RN  Post vital signs: Reviewed and stable  Last Vitals:  Vitals Value Taken Time  BP 79/31 09/05/19 1016  Temp    Pulse 113 09/05/19 1016  Resp 12 09/05/19 1016  SpO2 93 % 09/05/19 1016  Vitals shown include unvalidated device data.  Last Pain:  Vitals:   09/05/19 6701  TempSrc: Oral  PainSc: 0-No pain         Complications: No apparent anesthesia complications

## 2019-09-05 NOTE — Discharge Instructions (Signed)
YOU HAD AN ENDOSCOPIC PROCEDURE TODAY: Refer to the procedure report and other information in the discharge instructions given to you for any specific questions about what was found during the examination. If this information does not answer your questions, please call [place] office at [phone] to clarify.   YOU SHOULD EXPECT: Some feelings of bloating in the abdomen. Passage of more gas than usual. Walking can help get rid of the air that was put into your GI tract during the procedure and reduce the bloating. If you had a lower endoscopy (such as a colonoscopy or flexible sigmoidoscopy) you may notice spotting of blood in your stool or on the toilet paper. Some abdominal soreness may be present for a day or two, also.  DIET: Your first meal following the procedure should be a light meal and then it is ok to progress to your normal diet. A half-sandwich or bowl of soup is an example of a good first meal. Heavy or fried foods are harder to digest and may make you feel nauseous or bloated. Drink plenty of fluids but you should avoid alcoholic beverages for 24 hours. If you had a esophageal dilation, please see attached instructions for diet.    ACTIVITY: Your care partner should take you home directly after the procedure. You should plan to take it easy, moving slowly for the rest of the day. You can resume normal activity the day after the procedure however YOU SHOULD NOT DRIVE, use power tools, machinery or perform tasks that involve climbing or major physical exertion for 24 hours (because of the sedation medicines used during the test).   SYMPTOMS TO REPORT IMMEDIATELY: A gastroenterologist can be reached at any hour. Please call [phone]  for any of the following symptoms:   Following upper endoscopy (EGD, EUS, ERCP, esophageal dilation) Vomiting of blood or coffee ground material  New, significant abdominal pain  New, significant chest pain or pain under the shoulder blades  Painful or  persistently difficult swallowing  New shortness of breath  Black, tarry-looking or red, bloody stools  FOLLOW UP:  If any biopsies were taken you will be contacted by phone or by letter within the next 1-3 weeks. Call [phone]  if you have not heard about the biopsies in 3 weeks.  Please also call with any specific questions about appointments or follow up tests.  

## 2019-09-05 NOTE — Anesthesia Postprocedure Evaluation (Signed)
Anesthesia Post Note  Patient: Mycala Warshawsky  Procedure(s) Performed: ESOPHAGOGASTRODUODENOSCOPY (EGD) WITH PROPOFOL (N/A ) ENDOROTOR FOREIGN BODY REMOVAL STENT REMOVAL PANCREATIC STENT PLACEMENT     Patient location during evaluation: PACU Anesthesia Type: General Level of consciousness: awake and alert, oriented and patient cooperative Pain management: pain level controlled Vital Signs Assessment: post-procedure vital signs reviewed and stable Respiratory status: spontaneous breathing, nonlabored ventilation and respiratory function stable Cardiovascular status: blood pressure returned to baseline and stable Postop Assessment: no apparent nausea or vomiting Anesthetic complications: no Comments: Very hyperglycemic in preop, treated with IV insulin and boluses throughout the case. BS in the 200s in PACU; instructed pt to continue normal insulin regimen at home this afternoon.     Last Vitals:  Vitals:   09/05/19 0638 09/05/19 1012  BP: (!) 166/103 117/67  Pulse:  (!) 113  Resp: 11 11  Temp: 36.9 C   SpO2: 98% 93%    Last Pain:  Vitals:   09/05/19 1012  TempSrc:   PainSc: 0-No pain                 Lannie Fields

## 2019-09-05 NOTE — H&P (Signed)
GASTROENTEROLOGY PROCEDURE H&P NOTE   Primary Care Physician: Knox Royalty, MD  HPI: Monique Holland is a 54 y.o. female who presents for EGD with Necrosectomy of Pancreatic Necrosis.  Past Medical History:  Diagnosis Date  . Anxiety   . Asthma   . Bipolar disorder (HCC)   . Depression   . Diabetes mellitus without complication (HCC)   . Frequency of urination   . GERD (gastroesophageal reflux disease)   . History of asthma    last episode yrs ago  . Hypertension   . OSA (obstructive sleep apnea)    pt had study done oct 2014--  schedule for cpap titrate after kidney stone surgery  . Pancreatitis   . Pseudocyst of pancreas   . Schizophrenia (HCC)   . Ureteral calculi    BILATERAL  . Wears glasses    Past Surgical History:  Procedure Laterality Date  . BALLOON DILATION N/A 08/23/2019   Procedure: BALLOON DILATION;  Surgeon: Meridee Score Netty Starring., MD;  Location: [facility];  Service: Gastroenterology;  Laterality: N/A;  . BIOPSY  07/18/2019   Procedure: BIOPSY;  Surgeon: Shellia Cleverly, DO;  Location: [facility];  Service: Gastroenterology;;  . BIOPSY  08/23/2019   Procedure: BIOPSY;  Surgeon: Lemar Lofty., MD;  Location: [facility];  Service: Gastroenterology;;  . CESAREAN SECTION  1991  &  2002   w/ bilateral tubal ligation in 2002  . CYSTOSCOPY W/ URETERAL STENT PLACEMENT Right 08/10/2018   Procedure: CYSTOSCOPY WITH RETROGRADE PYELOGRAM/URETERAL STENT PLACEMENT;  Surgeon: Jerilee Field, MD;  Location: [facility];  Service: Urology;  Laterality: Right;  . CYSTOSCOPY WITH RETROGRADE PYELOGRAM, URETEROSCOPY AND STENT PLACEMENT Bilateral 05/02/2013   Procedure: CYSTOSCOPY WITH RETROGRADE PYELOGRAM, URETEROSCOPY ;  Surgeon: Valetta Fuller, MD;  Location: [facility] [place];  Service: Urology;  Laterality: Bilateral;  . CYSTOSCOPY WITH RETROGRADE PYELOGRAM, URETEROSCOPY AND STENT PLACEMENT Right 08/15/2018   Procedure: CYSTOSCOPY WITH RIGHT  RETROGRADE PYELOGRAM, RIGHT URETEROSCOPY WITH HOLMIUM LASER AND STENT PLACEMENT;  Surgeon: Crista Elliot, MD;  Location: [facility];  Service: Urology;  Laterality: Right;  . CYSTOSCOPY WITH STENT PLACEMENT Left 05/02/2013   Procedure: CYSTOSCOPY WITH STENT PLACEMENT;  Surgeon: Valetta Fuller, MD;  Location[facility];  Service: Urology;  Laterality: Left;  . ENDOROTOR N/A 08/26/2019   Procedure: [id number];  Surgeon: Mansouraty, Netty Starring., MD;  Location: [facility];  Service: Gastroenterology;  Laterality: N/A;  . ESOPHAGOGASTRODUODENOSCOPY N/A 08/26/2019   Procedure: ESOPHAGOGASTRODUODENOSCOPY (EGD) with NECROSECTOMY;  Surgeon: Mansouraty, Netty Starring., MD;  Location: [facility];  Service: Gastroenterology;  Laterality: N/A;  . ESOPHAGOGASTRODUODENOSCOPY (EGD) WITH PROPOFOL N/A 07/18/2019   Procedure: ESOPHAGOGASTRODUODENOSCOPY (EGD) WITH PROPOFOL;  Surgeon: Shellia Cleverly, DO;  Location: [facility];  Service: Gastroenterology;  Laterality: N/A;  . ESOPHAGOGASTRODUODENOSCOPY (EGD) WITH PROPOFOL N/A 08/18/2019   Procedure: ESOPHAGOGASTRODUODENOSCOPY (EGD) WITH PROPOFOL;  Surgeon: Hilarie Fredrickson, MD;  Location: [facility];  Service: Endoscopy;  Laterality: N/A;  . ESOPHAGOGASTRODUODENOSCOPY (EGD) WITH PROPOFOL N/A 08/21/2019   Procedure: ESOPHAGOGASTRODUODENOSCOPY (EGD) WITH PROPOFOL;  Surgeon: Meridee Score Netty Starring., MD;  Location: [facility];  Service: Gastroenterology;  Laterality: N/A;  . ESOPHAGOGASTRODUODENOSCOPY (EGD) WITH PROPOFOL N/A 08/23/2019   Procedure: ESOPHAGOGASTRODUODENOSCOPY (EGD) WITH PROPOFOL;  Surgeon: Meridee Score Netty Starring., MD;  Location: [facility];  Service: Gastroenterology;  Laterality: N/A;  . EUS  08/23/2019   Procedure: UPPER ENDOSCOPIC ULTRASOUND (EUS) LINEAR;  Surgeon: Lemar Lofty., MD;  Location: [facility];  Service: Gastroenterology;;  .  HOLMIUM LASER APPLICATION Bilateral 01/03/4817   Procedure: HOLMIUM LASER APPLICATION;   Surgeon: Bernestine Amass, MD;  Location: [facility];  Service: Urology;  Laterality: Bilateral;  . LAPAROSCOPIC APPENDECTOMY N/A 05/22/2014   Procedure: APPENDECTOMY LAPAROSCOPIC;  Surgeon: Excell Seltzer, MD;  Location: [facility];  Service: General;  Laterality: N/A;  . PANCREATIC STENT PLACEMENT  08/23/2019   Procedure: PANCREATIC STENT PLACEMENT;  Surgeon: Irving Copas., MD;  Location: [facility];  Service: Gastroenterology;;  . Lavell Islam REMOVAL  08/26/2019   Procedure: STENT REMOVAL;  Surgeon: Irving Copas., MD;  Location: [employer];  Service: Gastroenterology;;  . TONSILLECTOMY  age 16  . UPPER ESOPHAGEAL ENDOSCOPIC ULTRASOUND (EUS) N/A 08/21/2019   Procedure: UPPER ESOPHAGEAL ENDOSCOPIC ULTRASOUND (EUS);  Surgeon: Irving Copas., MD;  Location: [facility];  Service: Gastroenterology;  Laterality: N/A;  For cyst gastrostomy   Current Facility-Administered Medications  Medication Dose Route Frequency Provider Last Rate Last Admin  . ciprofloxacin (CIPRO) IVPB 400 mg  400 mg Intravenous To Endo Esterwood, Amy S, PA-C       No Known Allergies Family History  Family history unknown: Yes   Social History   Socioeconomic History  . Marital status: Married    Spouse name: Not on file  . Number of children: Not on file  . Years of education: Not on file  . Highest education level: Not on file  Occupational History  . Not on file  Tobacco Use  . Smoking status: Never Smoker  . Smokeless tobacco: Never Used  Substance and Sexual Activity  . Alcohol use: No  . Drug use: No  . Sexual activity: Not on file  Other Topics Concern  . Not on file  Social History Narrative  . Not on file   Social Determinants of Health   Financial Resource Strain:   . Difficulty of Paying Living Expenses:   Food Insecurity:   . Worried About Charity fundraiser in the Last Year:   . Arboriculturist in the Last Year:   Transportation Needs:   . Lexicographer (Medical):   Marland Kitchen Lack of Transportation (Non-Medical):   Physical Activity:   . Days of Exercise per Week:   . Minutes of Exercise per Session:   Stress:   . Feeling of Stress :   Social Connections:   . Frequency of Communication with Friends and Family:   . Frequency of Social Gatherings with Friends and Family:   . Attends Religious Services:   . Active Member of Clubs or Organizations:   . Attends Archivist Meetings:   Marland Kitchen Marital Status:   Intimate Partner Violence:   . Fear of Current or Ex-Partner:   . Emotionally Abused:   Marland Kitchen Physically Abused:   . Sexually Abused:     Physical Exam: Vital signs in last 24 hours: Temp:  [98.5 F (36.9 C)] 98.5 F (36.9 C) (04/15 0638) Resp:  [11] 11 (04/15 0638) BP: (166)/(103) 166/103 (04/15 0638) SpO2:  [98 %] 98 % (04/15 0638) Weight:  [132 kg] 132 kg (04/15 5631)   GEN: NAD EYE: Sclerae anicteric ENT: MMM CV: Non-tachycardic GI: Soft, protuberant, mildly TTP in midabdomen NEURO:  Alert & Oriented x 3  Lab Results: No results for input(s): WBC, HGB, HCT, PLT in the last 72 hours. BMET No results for input(s): NA, K, CL, CO2, GLUCOSE, BUN, CREATININE, CALCIUM in the last 72 hours. LFT No results for input(s): PROT, ALBUMIN, AST,  ALT, ALKPHOS, BILITOT, BILIDIR, IBILI in the last 72 hours. PT/INR No results for input(s): LABPROT, INR in the last 72 hours.   Impression / Plan: This is a 54 y.o.female who presents for EGD with Necrosectomy.  Patient seems to have been doing well overall in regards to not having nausea or vomiting since our last procedure.  She describes a weight loss and a decreased appetite however.  No fevers or chills.  No bleeding.  Will plan to see how things look today and proceed with Necrosectomy and stent exchange as necessary.  If improvement then but cannot explain symptoms then may need repeat CT scan.  The risks and benefits of endoscopic evaluation were discussed with the  patient; these include but are not limited to the risk of perforation, infection, bleeding, missed lesions, lack of diagnosis, severe illness requiring hospitalization, pancreatitis, as well as anesthesia and sedation related illnesses.  The patient is agreeable to proceed.    Corliss Parish, MD [facility] Office # [phone]

## 2019-09-05 NOTE — Op Note (Signed)
[facility] Patient Name: Monique Holland Procedure Date : 09/05/2019 MRN: [id number] Attending MD: Justice Britain , MD Date of Birth: [birth year] CSN: [id number] Age: 54 Admit Type: Outpatient Procedure:                Upper GI endoscopy Indications:              Pancreatic necrosis, Pancreatic pseudocyst Providers:                Justice Britain, MD, Carlyn Reichert, RN, Corie Chiquito, Technician, Sampson Si, CRNA Referring MD:             Gerrit Heck, MD Medicines:                Monitored Anesthesia Care, General Anesthesia,                            Cipro 846 mg IV Complications:            No immediate complications. Estimated Blood Loss:     Estimated blood loss was minimal. Procedure:                Pre-Anesthesia Assessment:                           - Prior to the procedure, a History and Physical                            was performed, and patient medications and                            allergies were reviewed. The patient's tolerance of                            previous anesthesia was also reviewed. The risks                            and benefits of the procedure and the sedation                            options and risks were discussed with the patient.                            All questions were answered, and informed consent                            was obtained. Prior Anticoagulants: The patient has                            taken no previous anticoagulant or antiplatelet                            agents. ASA Grade Assessment: III - A patient with  severe systemic disease. After reviewing the risks                            and benefits, the patient was deemed in                            satisfactory condition to undergo the procedure.                           After obtaining informed consent, the endoscope was                            passed under direct vision. Throughout  the                            procedure, the patient's blood pressure, pulse, and                            oxygen saturations were monitored continuously. The                            [id number] ([id number] Olympus therapeutic                            gastroscope was introduced through the mouth, and                            advanced to the third part of duodenum. The upper                            GI endoscopy was accomplished without difficulty.                            The patient tolerated the procedure. Scope In: Scope Out: Findings:      No gross lesions were noted in the entire esophagus.      A previously placed AXIOS cystgastrostomy stent was found on the       posterior wall of the stomach with two double pigtail stents in place.       The two double pigtail stents were removed from the cystgastrostomy with       a snare. The cyste was entered through the AXIOS under direct vision.       The cyst was completely filled with fluid and black necrotic tissue that       was pasty and adherent to the cyst wall. Necrosectomy was performed with       a Raptor grasping device and mostly Endorotor Powered Endoscopic       Debrider, requiring multiple intubations of the cyst. A total of 90       minutes of debridement was performed today. Pockets of pus were noted       after manipulation and removal of debris. At the conclusion of the       procedure, a medium amount of necrotic tissue and a medium amount of       pink, viable tissue was found within the walled off necrotic cyst on  direct vision. Two 10 French x 5 cm double pigtail stents were inserted       across the AXIOS tract to prevent Necroma from closing off tract.      One non-bleeding superficial gastric ulcer with a clean ulcer base       (Forrest Class III) was found in the gastric antrum. The lesion was 8 mm       in largest dimension.      No gross lesions were noted in the entire examined stomach.      No  gross lesions were noted in the duodenal bulb, in the first portion       of the duodenum and in the second portion of the duodenum.      A mild extrinsic impression was found in the third portion of the       duodenum. Impression:               - No gross lesions in esophagus.                           - Pre-existing AXIOS cystgastrostomy stent with                            double pigtail stents present. Double pigtail                            stents removed.                           - Necrosectomy was performed. Double pigtail                            replaced through the AXIOS.                           - Non-bleeding gastric ulcer with a clean ulcer                            base (Forrest Class III).                           - No gross lesions in the duodenal bulb, in the                            first portion of the duodenum and in the second                            portion of the duodenum.                           - Duodenal extrinsic impression in D3. Recommendation:           - The patient will be observed post-procedure,                            until all discharge criteria are met.                           -  Discharge patient to home.                           - Patient has a contact number available for                            emergencies. The signs and symptoms of potential                            delayed complications were discussed with the                            patient. Return to normal activities tomorrow.                            Written discharge instructions were provided to the                            patient.                           - Low fat diet.                           - Continue present medications.                           - Will switch to Ciprofloxacin for next few weeks                            for infection prophylaxis.                           - Will look into next 1-1.5 weeks for repeat EGD                             with Necrosectomy in effort of further debridement                            and eventual AXIOS stent removal.                           - Repeat CT to be decided after next debridement.                           - Observe patient's clinical course.                           - The findings and recommendations were discussed                            with the patient.                           - The findings and recommendations were discussed  with the patient's family. Procedure Code(s):        --- Professional ---                           [phone], Esophagogastroduodenoscopy, flexible,                            transoral; diagnostic, including collection of                            specimen(s) by brushing or washing, when performed                            (separate procedure)                           [phone], Unlisted procedure, pancreas Diagnosis Code(s):        --- Professional ---                           Z97.8, Presence of other specified devices                           K25.9, Gastric ulcer, unspecified as acute or                            chronic, without hemorrhage or perforation                           K31.89, Other diseases of stomach and duodenum                           K86.89, Other specified diseases of pancreas                           K86.3, Pseudocyst of pancreas CPT copyright 2019 American [employer]. All rights reserved. The codes documented in this report are preliminary and upon coder review may  be revised to meet current compliance requirements. Justice Britain, MD 09/05/2019 10:23:37 AM Number of Addenda: 0

## 2019-09-05 NOTE — Anesthesia Procedure Notes (Signed)
Procedure Name: Intubation Date/Time: 09/05/2019 7:48 AM Performed by: Barrington Ellison, CRNA Pre-anesthesia Checklist: Patient identified, Emergency Drugs available, Suction available and Patient being monitored Patient Re-evaluated:Patient Re-evaluated prior to induction Oxygen Delivery Method: Circle System Utilized Preoxygenation: Pre-oxygenation with 100% oxygen Induction Type: IV induction and Rapid sequence Ventilation: Mask ventilation without difficulty Laryngoscope Size: Mac and 3 Grade View: Grade I Tube type: Oral Tube size: 7.5 mm Number of attempts: 1 Airway Equipment and Method: Stylet and Oral airway Placement Confirmation: ETT inserted through vocal cords under direct vision,  positive ETCO2 and breath sounds checked- equal and bilateral Secured at: 22 cm Tube secured with: Tape Dental Injury: Teeth and Oropharynx as per pre-operative assessment

## 2019-09-06 ENCOUNTER — Ambulatory Visit
Admit: 2019-09-06 | Discharge: 2019-09-06 | Disposition: A | Discharge status: Home / Self Care | Payer: 59 | Attending: Family Medicine | Admitting: Family Medicine

## 2019-09-06 ENCOUNTER — Other Ambulatory Visit: Payer: Self-pay

## 2019-09-06 ENCOUNTER — Encounter: Payer: Self-pay | Admitting: *Deleted

## 2019-09-06 DIAGNOSIS — Z1231 Encounter for screening mammogram for malignant neoplasm of breast: Secondary | ICD-10-CM

## 2019-09-06 IMAGING — MG DIGITAL SCREENING BILAT W/ TOMO W/ CAD
8 of 14 series · 8 of 40 positions shown · non-contrast
Comparison: None.

CLINICAL DATA: Screening.

EXAM:
DIGITAL SCREENING BILATERAL MAMMOGRAM WITH TOMO AND CAD

[R MLO synth-2D]
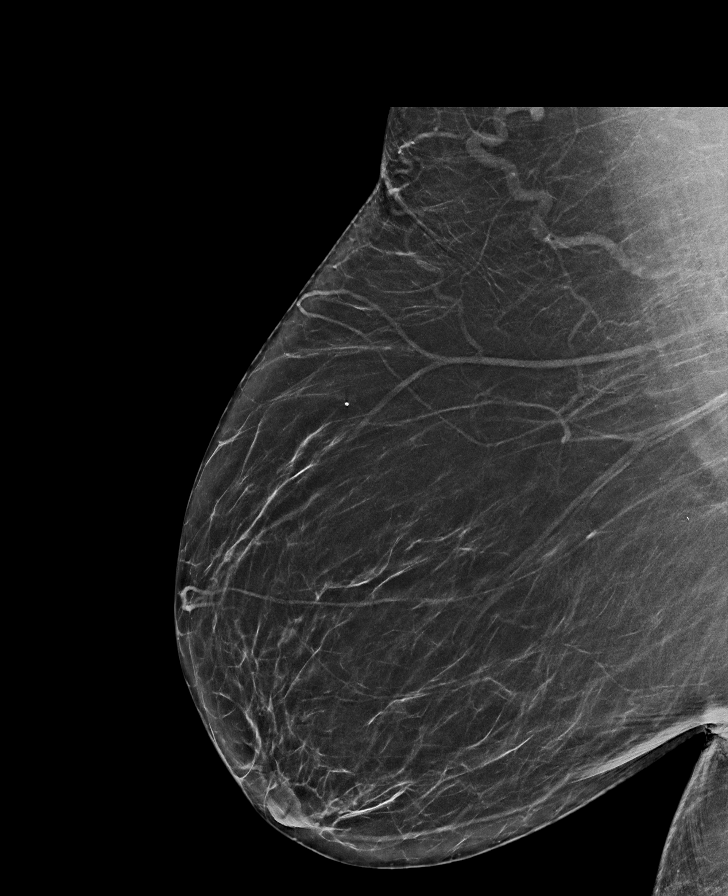

[L CC synth-2D (1 of 2)]
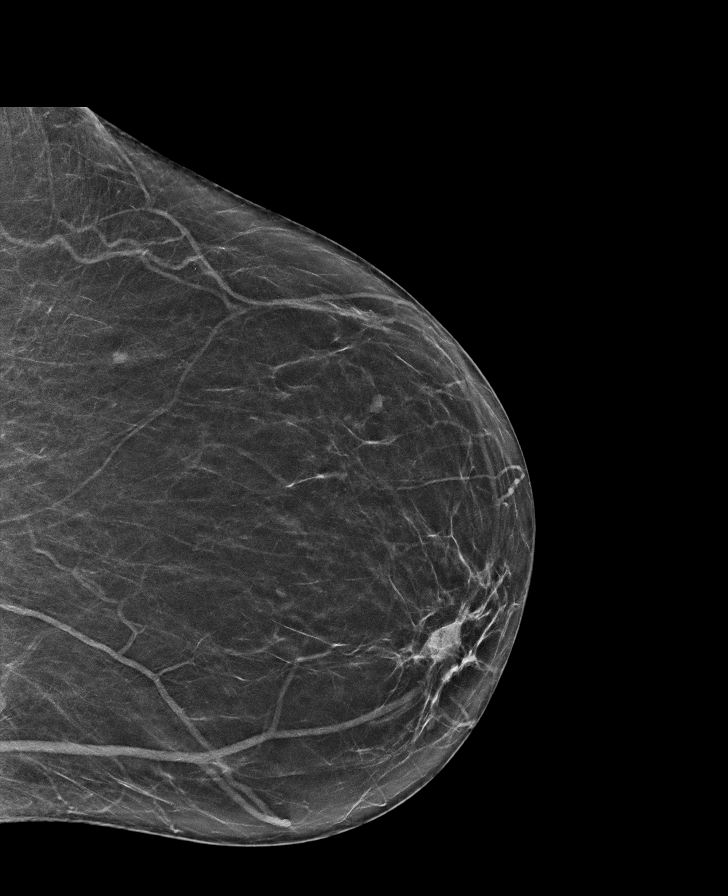

[L CC synth-2D (2 of 2)]
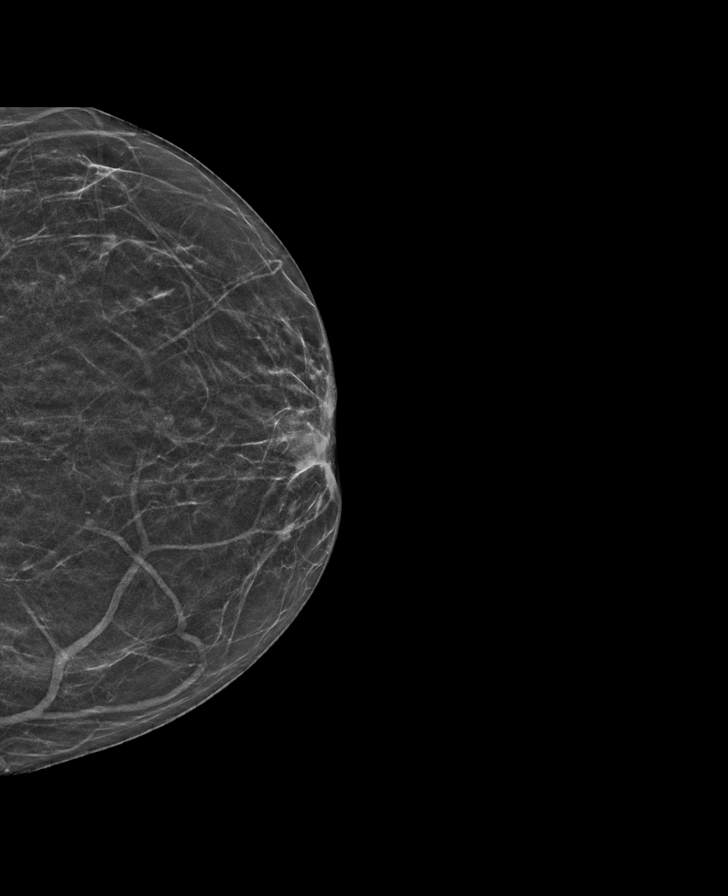

[L MLO synth-2D (1 of 2)]
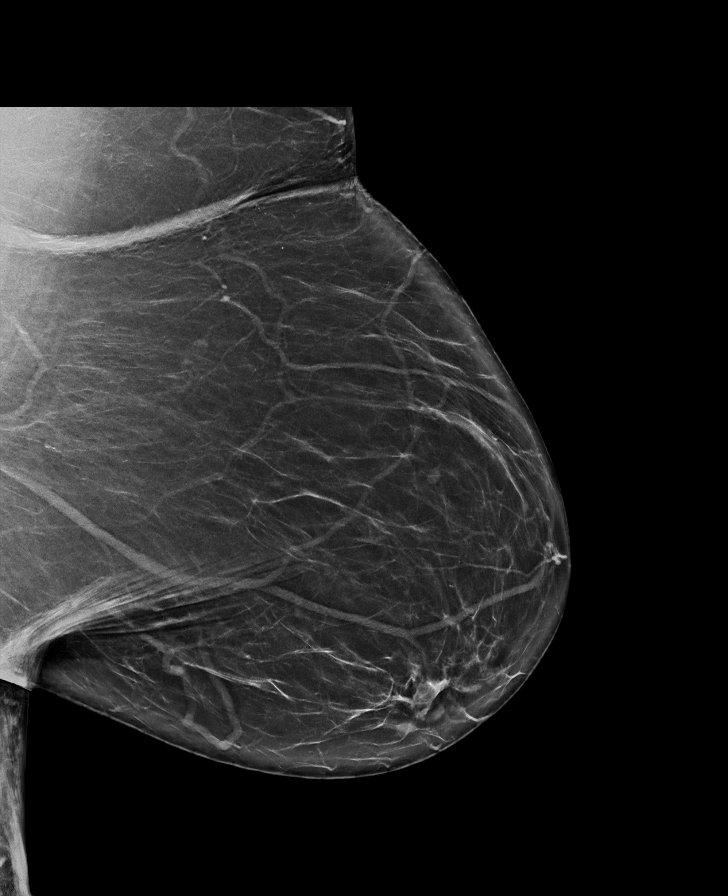

[R CC synth-2D (1 of 2)]
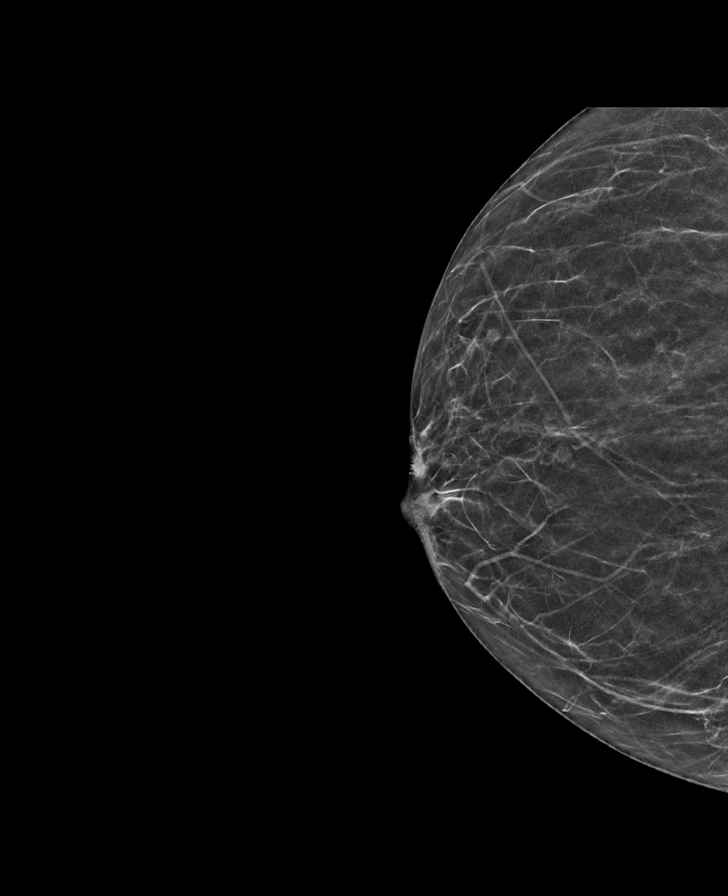

[R CC synth-2D (2 of 2)]
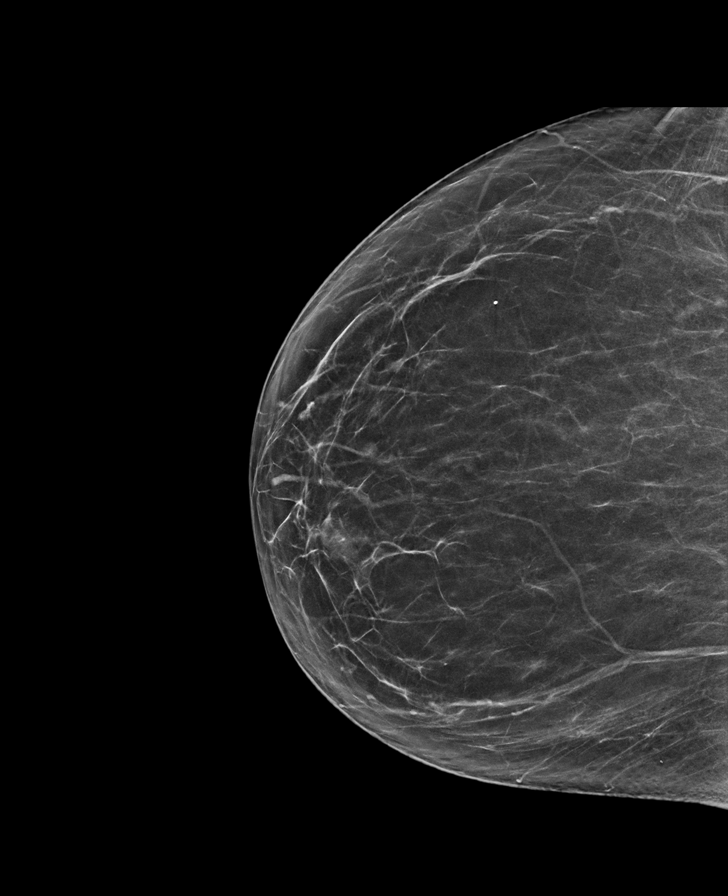

[L MLO synth-2D (2 of 2)]
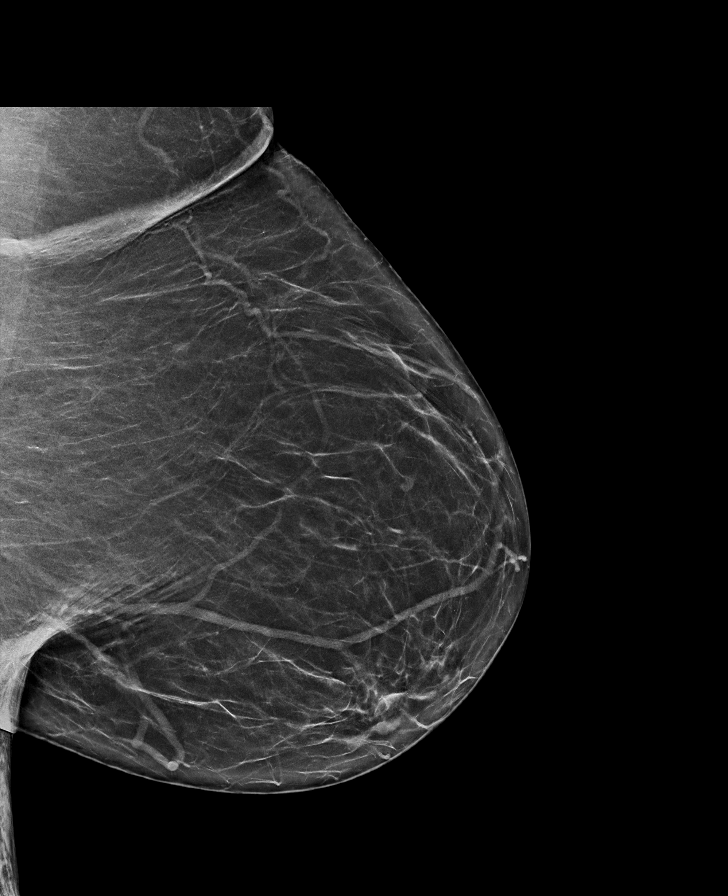

[L CC tomo · tomo slice 31/61.0]
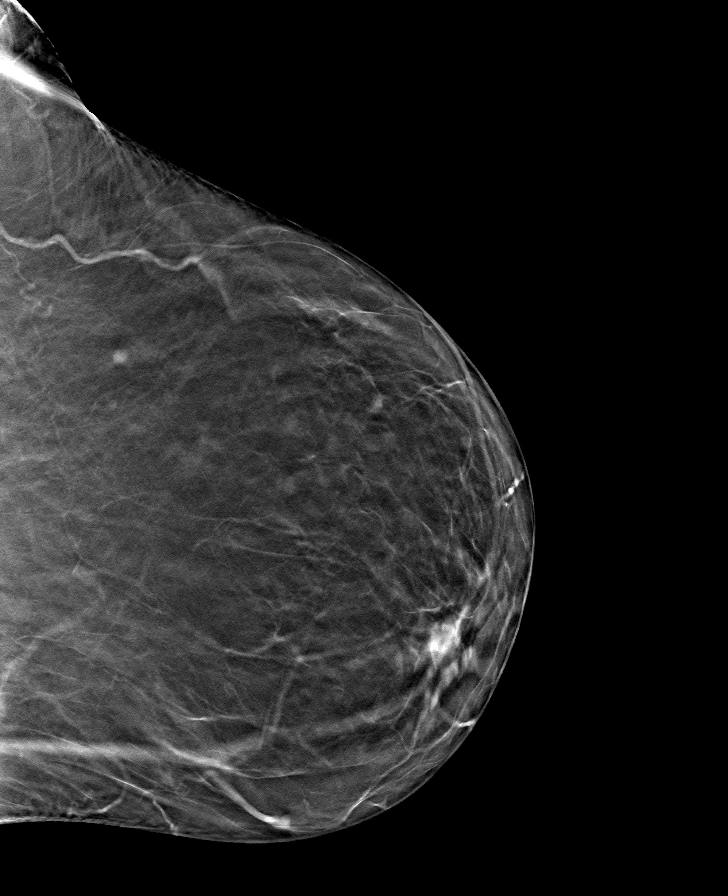

[8 of 40 positions shown; findings below may reference images not displayed]

ACR Breast Density Category b: There are scattered areas of
fibroglandular density.
FINDINGS: There are no findings suspicious for malignancy. Images were
processed with CAD.
IMPRESSION: No mammographic evidence of malignancy. A result letter of this
screening mammogram will be mailed directly to the patient.

RECOMMENDATION:
Screening mammogram in one year. (Code:[1T])

BI-RADS CATEGORY  1: Negative.

## 2019-09-10 ENCOUNTER — Encounter (HOSPITAL_BASED_OUTPATIENT_CLINIC_OR_DEPARTMENT_OTHER): Payer: Self-pay | Admitting: Emergency Medicine

## 2019-09-10 ENCOUNTER — Other Ambulatory Visit: Payer: Self-pay

## 2019-09-10 ENCOUNTER — Telehealth: Payer: Self-pay

## 2019-09-10 ENCOUNTER — Emergency Department (HOSPITAL_BASED_OUTPATIENT_CLINIC_OR_DEPARTMENT_OTHER): Payer: Medicare Other

## 2019-09-10 ENCOUNTER — Emergency Department (HOSPITAL_BASED_OUTPATIENT_CLINIC_OR_DEPARTMENT_OTHER)
Admission: EM | Admit: 2019-09-10 | Discharge: 2019-09-10 | Disposition: A | Discharge status: Home / Self Care | Payer: Medicare Other | Attending: Emergency Medicine | Admitting: Emergency Medicine

## 2019-09-10 DIAGNOSIS — Z794 Long term (current) use of insulin: Secondary | ICD-10-CM | POA: Diagnosis not present

## 2019-09-10 DIAGNOSIS — E1165 Type 2 diabetes mellitus with hyperglycemia: Secondary | ICD-10-CM | POA: Diagnosis not present

## 2019-09-10 DIAGNOSIS — R739 Hyperglycemia, unspecified: Secondary | ICD-10-CM

## 2019-09-10 DIAGNOSIS — K861 Other chronic pancreatitis: Secondary | ICD-10-CM | POA: Diagnosis not present

## 2019-09-10 DIAGNOSIS — I10 Essential (primary) hypertension: Secondary | ICD-10-CM | POA: Insufficient documentation

## 2019-09-10 DIAGNOSIS — R1013 Epigastric pain: Secondary | ICD-10-CM

## 2019-09-10 DIAGNOSIS — Z79899 Other long term (current) drug therapy: Secondary | ICD-10-CM | POA: Diagnosis not present

## 2019-09-10 DIAGNOSIS — K8591 Acute pancreatitis with uninfected necrosis, unspecified: Secondary | ICD-10-CM

## 2019-09-10 LAB — COMPREHENSIVE METABOLIC PANEL
ALT: 8 U/L (ref 0–44)
AST: 11 U/L — ABNORMAL LOW (ref 15–41)
Albumin: 2.9 g/dL — ABNORMAL LOW (ref 3.5–5.0)
Alkaline Phosphatase: 82 U/L (ref 38–126)
Anion gap: 12 (ref 5–15)
BUN: 9 mg/dL (ref 6–20)
CO2: 26 mmol/L (ref 22–32)
Calcium: 8.8 mg/dL — ABNORMAL LOW (ref 8.9–10.3)
Chloride: 89 mmol/L — ABNORMAL LOW (ref 98–111)
Creatinine, Ser: 1 mg/dL (ref 0.44–1.00)
GFR calc Af Amer: >60 mL/min (ref >60)
GFR calc non Af Amer: >60 mL/min (ref >60)
Glucose, Bld: 594 mg/dL (ref 70–99)
Potassium: 3.8 mmol/L (ref 3.5–5.1)
Sodium: 127 mmol/L — ABNORMAL LOW (ref 135–145)
Total Bilirubin: 0.6 mg/dL (ref 0.3–1.2)
Total Protein: 7.3 g/dL (ref 6.5–8.1)

## 2019-09-10 LAB — CBC WITH DIFFERENTIAL/PLATELET
Abs Immature Granulocytes: 0.03 10*3/uL (ref 0.00–0.07)
Basophils Absolute: 0 10*3/uL (ref 0.0–0.1)
Basophils Relative: 0 %
Eosinophils Absolute: 0.1 10*3/uL (ref 0.0–0.5)
Eosinophils Relative: 1 %
HCT: 32.5 % — ABNORMAL LOW (ref 36.0–46.0)
Hemoglobin: 9.7 g/dL — ABNORMAL LOW (ref 12.0–15.0)
Immature Granulocytes: 1 %
Lymphocytes Relative: 19 %
Lymphs Abs: 1.2 10*3/uL (ref 0.7–4.0)
MCH: 22 pg — ABNORMAL LOW (ref 26.0–34.0)
MCHC: 29.8 g/dL — ABNORMAL LOW (ref 30.0–36.0)
MCV: 73.9 fL — ABNORMAL LOW (ref 80.0–100.0)
Monocytes Absolute: 0.9 10*3/uL (ref 0.1–1.0)
Monocytes Relative: 14 %
Neutro Abs: 4.1 10*3/uL (ref 1.7–7.7)
Neutrophils Relative %: 65 %
Platelets: 258 10*3/uL (ref 150–400)
RBC: 4.4 MIL/uL (ref 3.87–5.11)
RDW: 14.4 % (ref 11.5–15.5)
WBC: 6.2 10*3/uL (ref 4.0–10.5)
nRBC: 0 % (ref 0.0–0.2)

## 2019-09-10 LAB — CBG MONITORING, ED: Glucose-Capillary: 502 mg/dL (ref 70–99)

## 2019-09-10 LAB — URINALYSIS, MICROSCOPIC (REFLEX)

## 2019-09-10 LAB — LIPASE, BLOOD: Lipase: 12 U/L (ref 11–51)

## 2019-09-10 LAB — URINALYSIS, ROUTINE W REFLEX MICROSCOPIC
Bilirubin Urine: NEGATIVE
Glucose, UA: >=500 mg/dL — AB
Hgb urine dipstick: NEGATIVE
Ketones, ur: NEGATIVE mg/dL
Leukocytes,Ua: NEGATIVE
Nitrite: NEGATIVE
Protein, ur: NEGATIVE mg/dL
Specific Gravity, Urine: <1.005 — ABNORMAL LOW (ref 1.005–1.030)
pH: 6.5 (ref 5.0–8.0)

## 2019-09-10 LAB — PREGNANCY, URINE: Preg Test, Ur: NEGATIVE

## 2019-09-10 IMAGING — CT CT ABD-PELV W/ CM
2 of 5 series · 15 of 46 positions shown, 17 images · IV contrast (omnipaque)
Comparison: CT a abdomen [DATE]. CT of the abdomen pelvis
[DATE].

CLINICAL DATA: Pancreatitis, persistent. Left abdomen and flank
pain.

EXAM:
CT ABDOMEN AND PELVIS WITH CONTRAST
TECHNIQUE: Multidetector CT imaging of the abdomen and pelvis was performed
using the standard protocol following bolus administration of
intravenous contrast.
CONTRAST:  100mL OMNIPAQUE IOHEXOL 300 MG/ML  SOLN

[Series 2: axial st · axial · 0.98mm/px · z∈[-476,-1]mm · 12 of 107 slices shown, 14 images]
[im 6/107  soft-tissue]
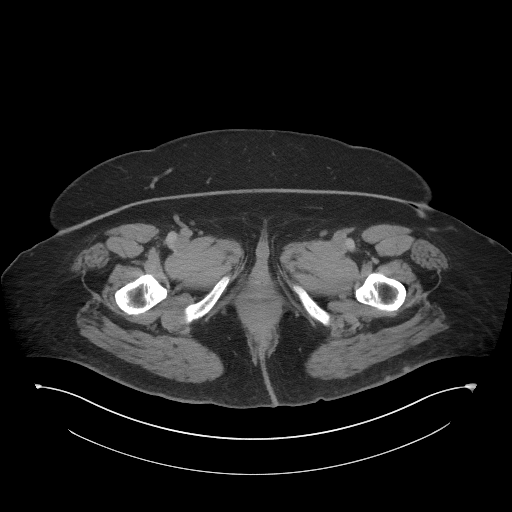
[im 6/107  bone]
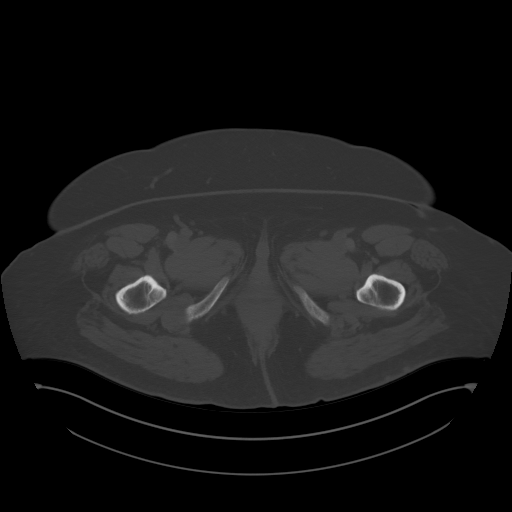
[im 17/107  soft-tissue]
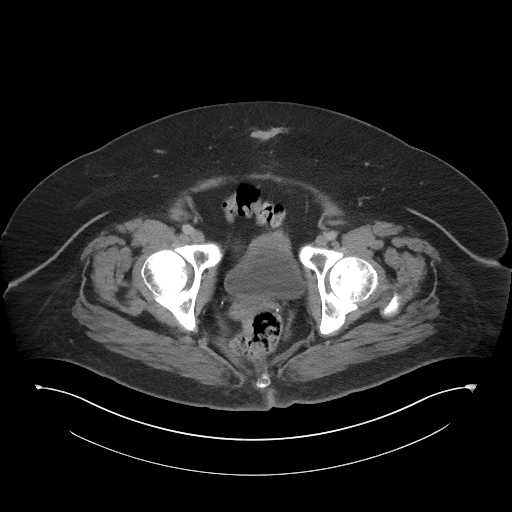
[im 23/107  soft-tissue]
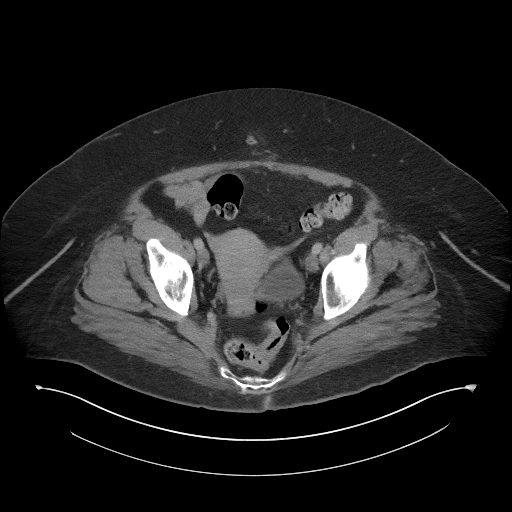
[im 34/107  soft-tissue]
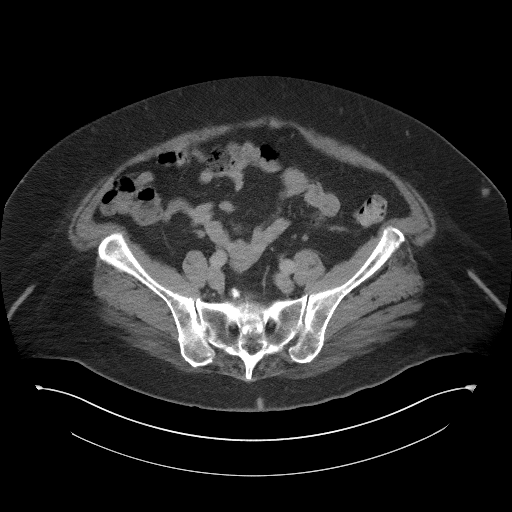
[im 40/107  soft-tissue]
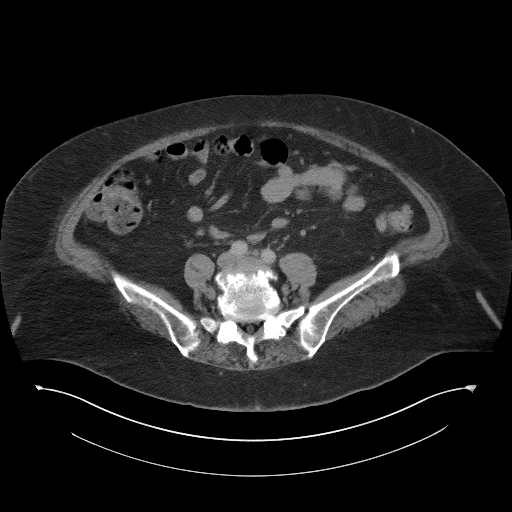
[im 51/107  soft-tissue]
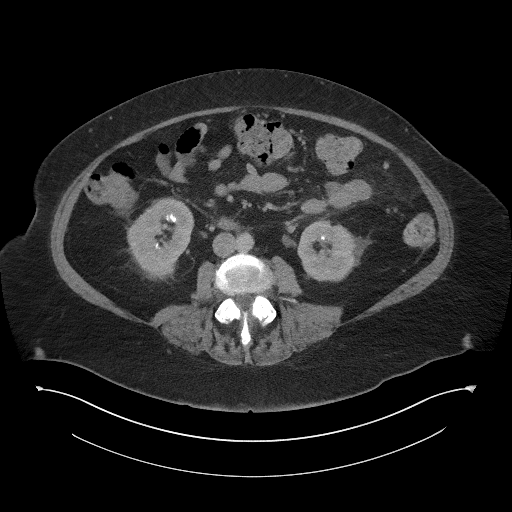
[im 56/107  soft-tissue]
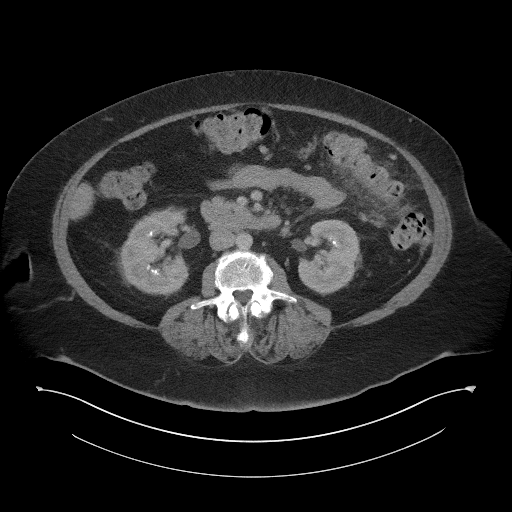
[im 67/107  soft-tissue]
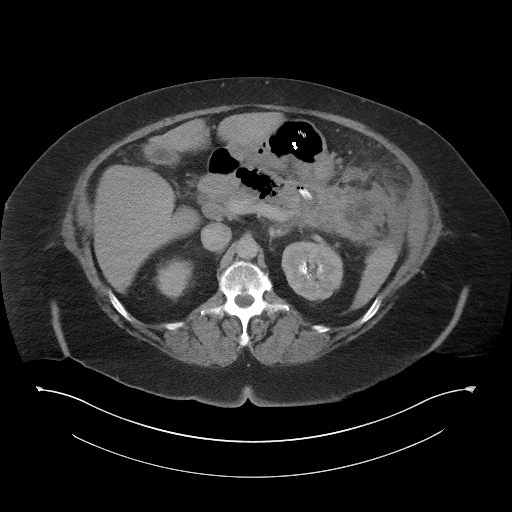
[im 73/107  soft-tissue]
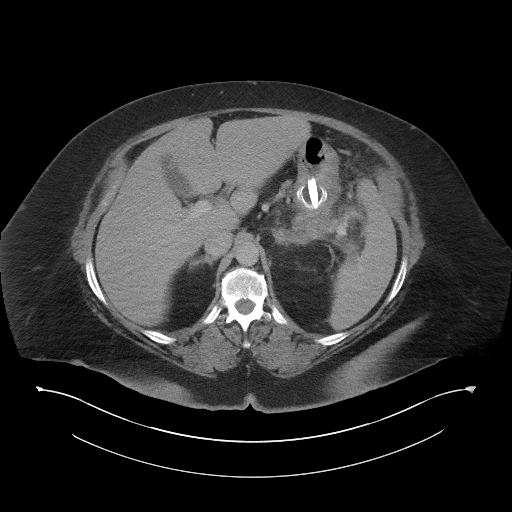
[im 73/107  bone]
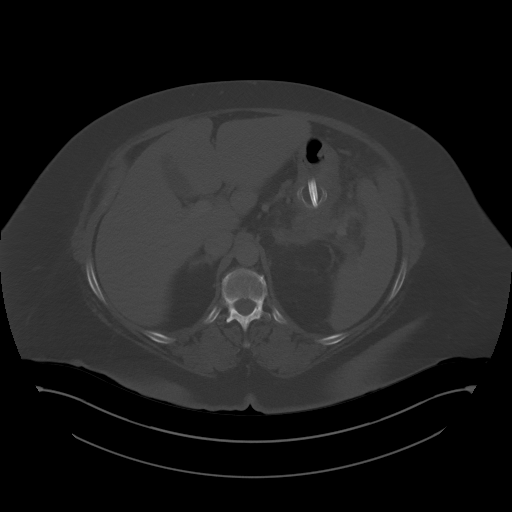
[im 84/107  soft-tissue]
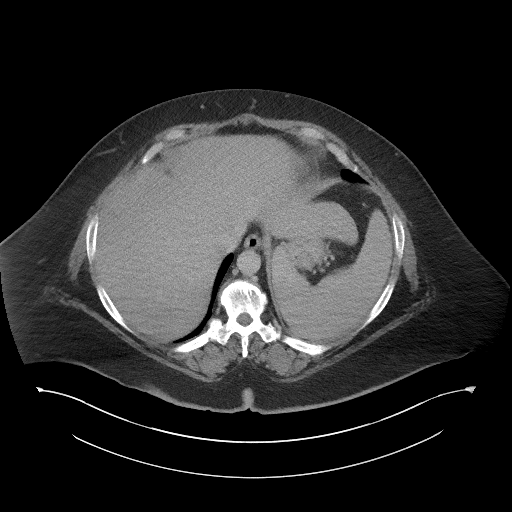
[im 90/107  soft-tissue]
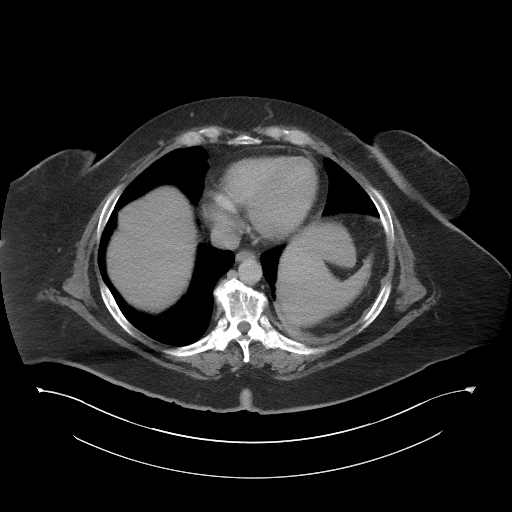
[im 101/107  soft-tissue]
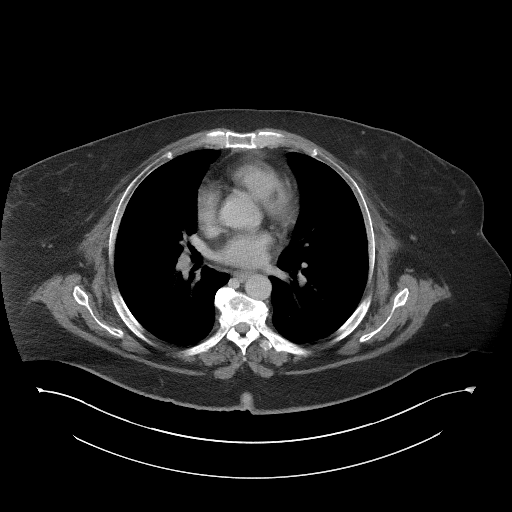

[Series 5: coronal st · coronal · 0.96mm/px · 3 of 100 slices shown]
[im 34/100  soft-tissue]
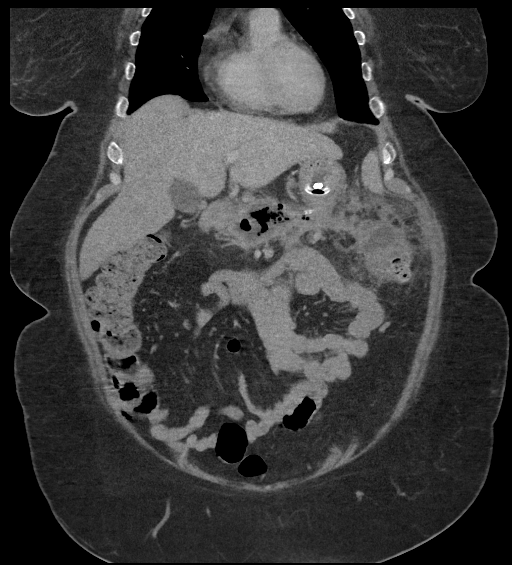
[im 45/100  soft-tissue]
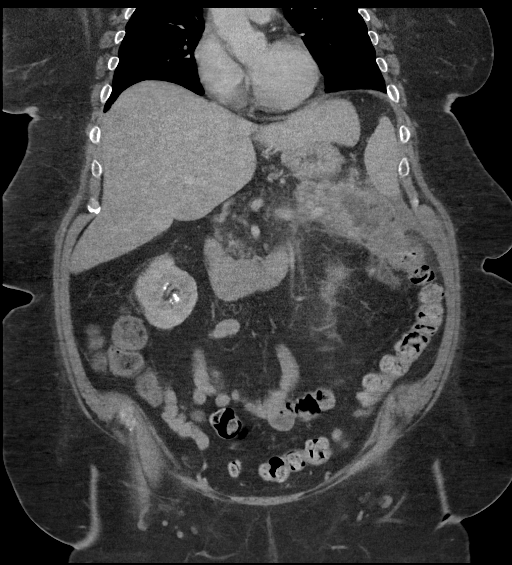
[im 56/100  soft-tissue]
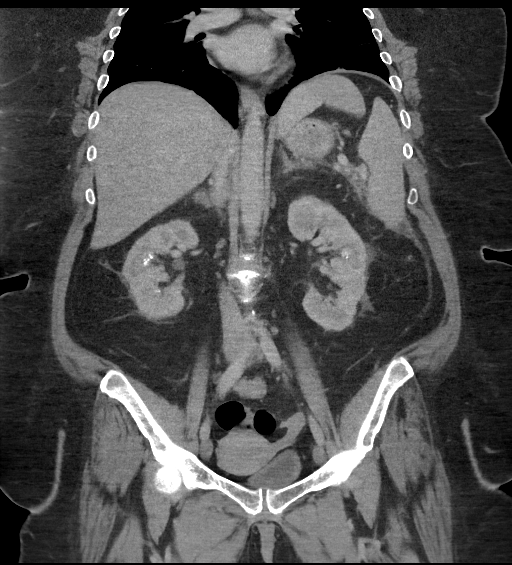

[15 of 46 positions shown; findings below may reference images not displayed]

FINDINGS: Lower chest: Linear atelectasis or scarring is present at the left
base. Lung bases are otherwise clear. The heart size is normal. No
significant pleural or pericardial effusion is present.

Hepatobiliary: No focal liver abnormality is seen. No gallstones,
gallbladder wall thickening, or biliary dilatation.

Pancreas: Pancreatic head within normal limits. Diffuse inflammatory
changes are present throughout the body, similar the prior exam.
Cysto gastrostomy tube is in place. The peripancreatic fluid
collections have decreased in size. Increased wall thickening
inflammatory changes are associated. There is gas within the more
central collection.

Spleen: Secondary inflammatory changes present at the inferior
aspect of the spleen. Mild splenomegaly is noted. The spleen
measures 13 mm cephalo caudad.

Adrenals/Urinary Tract: The adrenal glands are normal bilaterally.
Nonobstructing nephrolithiasis is again noted. Ureters are within
normal limits. The urinary bladder is normal.

Stomach/Bowel: Drainage catheter is in place in the stomach.
Secondary inflammatory changes are present the duodenum. Distal
small bowel is mostly collapsed without additional changes. Terminal
ileum is within normal limits. Appendix is surgically absent. The
ascending proximal transverse colon is. Diffuse inflammatory changes
are present at the splenic flexure. Descending and sigmoid colon are
normal.

Vascular/Lymphatic: Atherosclerotic calcifications are present
without aneurysm. No significant retroperitoneal adenopathy is
present.

Reproductive: Uterus and bilateral adnexa are unremarkable.

Other: No abdominal wall hernia or abnormality. No abdominopelvic
ascites.

Musculoskeletal: Bilateral L4 pars defects and anterolisthesis are
stable. Endplate changes are noted at L4-5. Fused anterior
osteophytes are present the thoracic spine. No focal lytic or
blastic lesions are present. Bony pelvis is within normal limits.
Hips are located and within normal limits bilaterally.
IMPRESSION: 1. Status post cysto gastrostomy with decompression of the size of
the pancreatic pseudocyst.
2. Wall thickening about the pseudocysts and increased inflammatory
changes suggests secondary infection of the pseudocysts.
3. Gas within the pseudocysts is likely due to the drainage
catheter.
4. Diffuse inflammatory changes throughout the body of the pancreas
compatible with acute on chronic pancreatitis.
5. Secondary inflammatory changes of the colon at the splenic
flexure and of the duodenum.
6. Nonobstructing nephrolithiasis.
7. Stable bilateral L4 pars defects and anterolisthesis.
8. Aortic Atherosclerosis ([PP]-[PP]).

## 2019-09-10 MED ORDER — SODIUM CHLORIDE 0.9 % IV BOLUS
1000.0000 mL | Freq: Once | INTRAVENOUS | Status: AC
  Administered 2019-09-10: 1000 mL via INTRAVENOUS

## 2019-09-10 MED ORDER — IOHEXOL 300 MG/ML  SOLN
100.0000 mL | Freq: Once | INTRAMUSCULAR | Status: AC | PRN
  Administered 2019-09-10: 100 mL via INTRAVENOUS

## 2019-09-10 MED ORDER — SODIUM CHLORIDE 0.9 % IV BOLUS: 1000.0000 mL | Freq: Once | INTRAVENOUS | Status: DC

## 2019-09-10 MED ORDER — MORPHINE SULFATE (PF) 4 MG/ML IV SOLN
4.0000 mg | Freq: Once | INTRAVENOUS | Status: AC
  Administered 2019-09-10: 4 mg via INTRAVENOUS
  Filled 2019-09-10: qty 1

## 2019-09-10 MED ORDER — OXYCODONE-ACETAMINOPHEN 5-325 MG PO TABS
1.0000 | ORAL_TABLET | Freq: Once | ORAL | Status: AC
  Administered 2019-09-10: 1 via ORAL
  Filled 2019-09-10: qty 1

## 2019-09-10 MED ORDER — INSULIN GLARGINE 100 UNIT/ML ~~LOC~~ SOLN
30.0000 [IU] | Freq: Once | SUBCUTANEOUS | Status: AC
  Administered 2019-09-10: 30 [IU] via SUBCUTANEOUS
  Filled 2019-09-10: qty 1

## 2019-09-10 MED ORDER — ONDANSETRON HCL 4 MG/2ML IJ SOLN
4.0000 mg | Freq: Once | INTRAMUSCULAR | Status: AC
  Administered 2019-09-10: 4 mg via INTRAVENOUS
  Filled 2019-09-10: qty 2

## 2019-09-10 NOTE — ED Triage Notes (Signed)
C/o left sided abd pain radiating to left flank.

## 2019-09-10 NOTE — Telephone Encounter (Signed)
I need to get Monique Holland (MRN: [id number]) on that date for EGD with Necrosectomy.    I looked at CT, but not clear that it is infected with normal white count. I think her abdominal pain and issues stem from her poorly controlled diabetes.  Let's revisit on Thursday where things are, in case I need to do a procedure if she gets admitted.  Thanks. GM    The pt has been scheduled for 09/18/19 at 1030 am COVID on 4/24 at 1135 am.    The pt has been advised and instructions mailed to the home

## 2019-09-10 NOTE — Discharge Instructions (Addendum)
You were seen today for abdominal pain and nausea and vomiting.  This is likely related to chronic pancreatitis from your recent pancreatic pseudocysts and pancreatitis in February.  Follow-up clear liquid diet today.  Take your pain medications at home as directed.  Follow-up with your gastroenterologist to review CT findings from this morning.

## 2019-09-10 NOTE — ED Provider Notes (Addendum)
[facility] EMERGENCY DEPARTMENT Provider Note   CSN: [id number] Arrival date & time: 09/10/19  0143     History Chief Complaint  Patient presents with  . Abdominal Pain    Monique Holland is a 54 y.o. female.  HPI     This a 54 year old female with a history of diabetes, hypertension, recent complicated medical history with necrotizing pancreatitis, pseudocyst with multiple procedures who presents with epigastric pain.  Patient reports pain is similar to her prior pancreatitis pain is in her upper abdomen and radiates to her back.  She states she has had worsening pain over the last 1 day.  She takes oxycodone at home and has had minimal relief.  She also reports nonbilious, nonbloody emesis.  Denies diarrhea.  Denies urinary symptoms.  Denies any recent fevers, cough, shortness of breath.  Chart review reveals that she was admitted in February for necrotizing pancreatitis.  She subsequently was admitted in March for pancreatic pseudocyst.  On her most recent admission in late March, she had multiple procedures for said pancreatic pseudocyst.  Patient was discharged home on Augmentin for 21 days.  She continues to take Augmentin per history.  From discharge summary: 3/31: EGD/EUS with Dr. Meridee Score -acquired extrinsic deformity and posterior wall of stomach, erythematous mucosa of gastric body and antrum. 85 x 84 mm cyst walled off necrosis in peripancreatic region consistent with pancreatic pseudocyst and necrosis with 1 enlarged lymph node. Further imaging ordered by GI for further evaluation to evaluate for potential pseudoaneurysm - no pseudoaneurysm on imaging  4/2: Repeat EUS - cystic lesion in peripancreatic region consistent with pseudocyst with walled off necrosis. Cystogastrostomy created. Started on Cipro  4/5 S/P endoscopic necrosectomy with debridement , and placement of 2 pigtail stents, medium amt of remaining necrotic tissue and also candida esophagitis.   Started on Diflucan 400 mg x 1 day followed by 200 mg daily to complete 14 days. Cyst culture- multiple species occluding MSSA, non aereboic -Augmentin twice daily x21 days started  Past Medical History:  Diagnosis Date  . Anxiety   . Asthma   . Bipolar disorder (HCC)   . Depression   . Diabetes mellitus without complication (HCC)   . Frequency of urination   . GERD (gastroesophageal reflux disease)   . History of asthma    last episode yrs ago  . Hypertension   . OSA (obstructive sleep apnea)    pt had study done oct 2014--  schedule for cpap titrate after kidney stone surgery  . Pancreatitis   . Pseudocyst of pancreas   . Schizophrenia (HCC)   . Ureteral calculi    BILATERAL  . Wears glasses     Patient Active Problem List   Diagnosis Date Noted  . Non-intractable vomiting   . Hematemesis with nausea   . Abdominal pain 08/03/2019  . Essential hypertension 08/03/2019  . Pancreatic pseudocyst   . Nausea and vomiting in adult   . Necrotizing pancreatitis   . Gastritis and gastroduodenitis   . Candida esophagitis (HCC)   . Acute pancreatitis 07/17/2019  . Schizophrenia (HCC) 09/02/2018  . Obesity, Class III, BMI 40-49.9 (morbid obesity) (HCC) 09/02/2018  . Iron deficiency 09/02/2018  . Symptomatic anemia 09/01/2018  . Hypokalemia 09/01/2018  . Severe sepsis (HCC) 08/11/2018  . AKI (acute kidney injury) (HCC) 08/11/2018  . Hypotension 08/11/2018  . Type 2 diabetes mellitus (HCC) 08/11/2018  . Acute appendicitis 05/22/2014  . Ureteral calculus, right 05/02/2013    Past Surgical History:  Procedure Laterality Date  . BALLOON DILATION N/A 08/23/2019   Procedure: BALLOON DILATION;  Surgeon: Meridee Score Netty Starring., MD;  Location: [facility];  Service: Gastroenterology;  Laterality: N/A;  . BIOPSY  07/18/2019   Procedure: BIOPSY;  Surgeon: Shellia Cleverly, DO;  Location: [facility];  Service: Gastroenterology;;  . BIOPSY  08/23/2019   Procedure: BIOPSY;  Surgeon:  Lemar Lofty., MD;  Location: [facility];  Service: Gastroenterology;;  . CESAREAN SECTION  1991  &  2002   w/ bilateral tubal ligation in 2002  . CYSTOSCOPY W/ URETERAL STENT PLACEMENT Right 08/10/2018   Procedure: CYSTOSCOPY WITH RETROGRADE PYELOGRAM/URETERAL STENT PLACEMENT;  Surgeon: Jerilee Field, MD;  Location: [facility];  Service: Urology;  Laterality: Right;  . CYSTOSCOPY WITH RETROGRADE PYELOGRAM, URETEROSCOPY AND STENT PLACEMENT Bilateral 05/02/2013   Procedure: CYSTOSCOPY WITH RETROGRADE PYELOGRAM, URETEROSCOPY ;  Surgeon: Valetta Fuller, MD;  Location: [facility];  Service: Urology;  Laterality: Bilateral;  . CYSTOSCOPY WITH RETROGRADE PYELOGRAM, URETEROSCOPY AND STENT PLACEMENT Right 08/15/2018   Procedure: CYSTOSCOPY WITH RIGHT RETROGRADE PYELOGRAM, RIGHT URETEROSCOPY WITH HOLMIUM LASER AND STENT PLACEMENT;  Surgeon: Crista Elliot, MD;  Location: [facility];  Service: Urology;  Laterality: Right;  . CYSTOSCOPY WITH STENT PLACEMENT Left 05/02/2013   Procedure: CYSTOSCOPY WITH STENT PLACEMENT;  Surgeon: Valetta Fuller, MD;  Location: [facility];  Service: Urology;  Laterality: Left;  . ENDOROTOR N/A 08/26/2019   Procedure: [id number];  Surgeon: Mansouraty, Netty Starring., MD;  Location: [facility];  Service: Gastroenterology;  Laterality: N/A;  . ENDOROTOR  09/05/2019   Procedure: [id number];  Surgeon: Mansouraty, Netty Starring., MD;  Location: [facility];  Service: Gastroenterology;;  . ESOPHAGOGASTRODUODENOSCOPY N/A 08/26/2019   Procedure: ESOPHAGOGASTRODUODENOSCOPY (EGD) with NECROSECTOMY;  Surgeon: Lemar Lofty., MD;  Location: [facility];  Service: Gastroenterology;  Laterality: N/A;  . ESOPHAGOGASTRODUODENOSCOPY (EGD) WITH PROPOFOL N/A 07/18/2019   Procedure: ESOPHAGOGASTRODUODENOSCOPY (EGD) WITH PROPOFOL;  Surgeon: Shellia Cleverly, DO;  Location: [facility];  Service: Gastroenterology;  Laterality: N/A;  .  ESOPHAGOGASTRODUODENOSCOPY (EGD) WITH PROPOFOL N/A 08/18/2019   Procedure: ESOPHAGOGASTRODUODENOSCOPY (EGD) WITH PROPOFOL;  Surgeon: Hilarie Fredrickson, MD;  Location: [facility];  Service: Endoscopy;  Laterality: N/A;  . ESOPHAGOGASTRODUODENOSCOPY (EGD) WITH PROPOFOL N/A 08/21/2019   Procedure: ESOPHAGOGASTRODUODENOSCOPY (EGD) WITH PROPOFOL;  Surgeon: Meridee Score Netty Starring., MD;  Location: [facility];  Service: Gastroenterology;  Laterality: N/A;  . ESOPHAGOGASTRODUODENOSCOPY (EGD) WITH PROPOFOL N/A 08/23/2019   Procedure: ESOPHAGOGASTRODUODENOSCOPY (EGD) WITH PROPOFOL;  Surgeon: Meridee Score Netty Starring., MD;  Location: [facility];  Service: Gastroenterology;  Laterality: N/A;  . ESOPHAGOGASTRODUODENOSCOPY (EGD) WITH PROPOFOL N/A 09/05/2019   Procedure: ESOPHAGOGASTRODUODENOSCOPY (EGD) WITH PROPOFOL;  Surgeon: Meridee Score Netty Starring., MD;  Location: [facility];  Service: Gastroenterology;  Laterality: N/A;   With pancreatic pseudocyst necrosectomy via established cyst gastrostomy  . EUS  08/23/2019   Procedure: UPPER ENDOSCOPIC ULTRASOUND (EUS) LINEAR;  Surgeon: Lemar Lofty., MD;  Location: [facility];  Service: Gastroenterology;;  . FOREIGN BODY REMOVAL  09/05/2019   Procedure: FOREIGN BODY REMOVAL;  Surgeon: Lemar Lofty., MD;  Location: [facility];  Service: Gastroenterology;;  . HOLMIUM LASER APPLICATION Bilateral 05/02/2013   Procedure: HOLMIUM LASER APPLICATION;  Surgeon: Valetta Fuller, MD;  Location: [facility];  Service: Urology;  Laterality: Bilateral;  . LAPAROSCOPIC APPENDECTOMY N/A 05/22/2014   Procedure: APPENDECTOMY LAPAROSCOPIC;  Surgeon: Glenna Fellows, MD;  Location: [facility];  Service: General;  Laterality: N/A;  . PANCREATIC STENT PLACEMENT  08/23/2019  Procedure: PANCREATIC STENT PLACEMENT;  Surgeon: Rush Landmark Telford Nab., MD;  Location: [facility];  Service: Gastroenterology;;  . PANCREATIC STENT PLACEMENT  09/05/2019   Procedure:  PANCREATIC STENT PLACEMENT;  Surgeon: Irving Copas., MD;  Location: [place];  Service: Gastroenterology;;  . Lavell Islam REMOVAL  08/26/2019   Procedure: STENT REMOVAL;  Surgeon: Irving Copas., MD;  Location: [place];  Service: Gastroenterology;;  . Lavell Islam REMOVAL  09/05/2019   Procedure: STENT REMOVAL;  Surgeon: Irving Copas., MD;  Location: [place];  Service: Gastroenterology;;  . TONSILLECTOMY  age 6  . UPPER ESOPHAGEAL ENDOSCOPIC ULTRASOUND (EUS) N/A 08/21/2019   Procedure: UPPER ESOPHAGEAL ENDOSCOPIC ULTRASOUND (EUS);  Surgeon: Irving Copas., MD;  Location: [facility];  Service: Gastroenterology;  Laterality: N/A;  For cyst gastrostomy     OB History   No obstetric history on file.     Family History  Family history unknown: Yes    Social History   Tobacco Use  . Smoking status: Never Smoker  . Smokeless tobacco: Never Used  Substance Use Topics  . Alcohol use: No  . Drug use: No    Home Medications Prior to Admission medications   Medication Sig Start Date End Date Taking? Authorizing Provider  acetaminophen (TYLENOL) 325 MG tablet Take 2 tablets (650 mg total) by mouth every 6 (six) hours as needed for mild pain (or Fever >/= 101). 08/19/19   Regalado, Belkys A, MD  albuterol (VENTOLIN HFA) 108 (90 Base) MCG/ACT inhaler Inhale 1-2 puffs into the lungs every 6 (six) hours as needed for wheezing or shortness of breath. 07/21/19   Regalado, Belkys A, MD  amitriptyline (ELAVIL) 75 MG tablet Take 75 mg by mouth at bedtime. 08/10/19   [provider]  amLODipine (NORVASC) 10 MG tablet Take 10 mg by mouth daily. 08/09/19   [provider]  ciprofloxacin (CIPRO) 500 MG tablet Take 1 tablet (500 mg total) by mouth 2 (two) times daily for 14 days. 09/05/19 09/19/19  Mansouraty, Telford Nab., MD  clonazePAM (KLONOPIN) 0.5 MG tablet Take 0.5 mg by mouth daily as needed for anxiety. 05/23/19   [provider]    lamoTRIgine (LAMICTAL) 200 MG tablet Take 200 mg by mouth daily. 06/06/19   [provider]  LANTUS SOLOSTAR 100 UNIT/ML Solostar Pen Inject 15 Units into the skin at bedtime. Patient taking differently: Inject 30 Units into the skin at bedtime.  07/21/19   Regalado, Belkys A, MD  LATUDA 60 MG TABS Take 1 tablet by mouth at bedtime. 08/10/19   [provider]  lisinopril (ZESTRIL) 10 MG tablet Take 10 mg by mouth 2 (two) times daily.    [provider]  lurasidone (LATUDA) 40 MG TABS tablet Take 1 tablet (40 mg total) by mouth at bedtime for 12 days. 08/27/19 09/08/19  Harold Hedge, MD  metoprolol tartrate (LOPRESSOR) 25 MG tablet Take 1 tablet (25 mg total) by mouth 2 (two) times daily. 08/19/19   Regalado, Belkys A, MD  ondansetron (ZOFRAN) 8 MG tablet Take 1 tablet (8 mg total) by mouth every 8 (eight) hours as needed for nausea or vomiting. 08/19/19   Regalado, Belkys A, MD  Oxycodone HCl 10 MG TABS Take 10 mg by mouth 4 (four) times daily as needed (pain).  08/12/19   [provider]  PREMARIN vaginal cream Place 1 Applicatorful vaginally at bedtime.  08/10/19   [provider]  senna (SENOKOT) 8.6 MG TABS tablet Take 1 tablet (8.6 mg  total) by mouth 2 (two) times daily. 07/21/19   Regalado, Jon Billings A, MD  SYMBICORT 160-4.5 MCG/ACT inhaler Inhale 2 puffs into the lungs 2 (two) times daily as needed (SOB/wheezing).  04/26/19   [provider]  traZODone (DESYREL) 100 MG tablet Take 100 mg by mouth at bedtime.    [provider]  TRULICITY 4.5 MG/0.5ML SOPN Inject 4.5 mg into the skin once a week. Take on Wed 07/29/19   [provider]    Allergies    Patient has no known allergies.  Review of Systems   Review of Systems  Constitutional: Negative for fever.  Respiratory: Negative for shortness of breath.   Cardiovascular: Negative for chest pain.  Gastrointestinal: Positive for abdominal pain, nausea and vomiting. Negative for  blood in stool and diarrhea.  Genitourinary: Negative for dysuria.  All other systems reviewed and are negative.   Physical Exam Updated Vital Signs BP 138/83 (BP Location: Left Arm)   Pulse (!) 112   Temp 99.8 F (37.7 C) (Oral)   Resp 18   Ht 1.727 m (5\' 8" )   Wt 132 kg   LMP 03/23/2014 Comment: (-) u preg?/ac  SpO2 96%   BMI 44.25 kg/m   Physical Exam Vitals and nursing note reviewed.  Constitutional:      Appearance: She is well-developed. She is obese. She is not ill-appearing.  HENT:     Head: Normocephalic and atraumatic.     Mouth/Throat:     Mouth: Mucous membranes are moist.  Eyes:     Pupils: Pupils are equal, round, and reactive to light.  Cardiovascular:     Rate and Rhythm: Normal rate and regular rhythm.     Heart sounds: Normal heart sounds.  Pulmonary:     Effort: Pulmonary effort is normal. No respiratory distress.     Breath sounds: No wheezing.  Abdominal:     General: Bowel sounds are normal.     Palpations: Abdomen is soft.     Tenderness: There is abdominal tenderness in the epigastric area. There is no guarding or rebound. Negative signs include Murphy's sign.  Musculoskeletal:     Cervical back: Neck supple.     Right lower leg: Edema present.     Left lower leg: Edema present.  Skin:    General: Skin is warm and dry.  Neurological:     Mental Status: She is alert and oriented to person, place, and time.  Psychiatric:        Mood and Affect: Mood normal.     ED Results / Procedures / Treatments   Labs (all labs ordered are listed, but only abnormal results are displayed) Labs Reviewed  URINALYSIS, ROUTINE W REFLEX MICROSCOPIC - Abnormal; Notable for the following components:      Result Value   Specific Gravity, Urine <1.005 (*)    Glucose, UA >=500 (*)    All other components within normal limits  CBC WITH DIFFERENTIAL/PLATELET - Abnormal; Notable for the following components:   Hemoglobin 9.7 (*)    HCT 32.5 (*)    MCV 73.9  (*)    MCH 22.0 (*)    MCHC 29.8 (*)    All other components within normal limits  URINALYSIS, MICROSCOPIC (REFLEX) - Abnormal; Notable for the following components:   Bacteria, UA RARE (*)    All other components within normal limits  COMPREHENSIVE METABOLIC PANEL - Abnormal; Notable for the following components:   Sodium 127 (*)    Chloride 89 (*)  Glucose, Bld 594 (*)    Calcium 8.8 (*)    Albumin 2.9 (*)    AST 11 (*)    All other components within normal limits  CBG MONITORING, ED - Abnormal; Notable for the following components:   Glucose-Capillary 502 (*)    All other components within normal limits  PREGNANCY, URINE  LIPASE, BLOOD    EKG None  Radiology CT ABDOMEN PELVIS W CONTRAST  Result Date: 09/10/2019 CLINICAL DATA:  Pancreatitis, persistent. Left abdomen and flank pain. EXAM: CT ABDOMEN AND PELVIS WITH CONTRAST TECHNIQUE: Multidetector CT imaging of the abdomen and pelvis was performed using the standard protocol following bolus administration of intravenous contrast. CONTRAST:  OMNIPAQUE IOHEXOL 300 MG/ML  SOLN COMPARISON:  CT a abdomen 08/21/2019. CT of the abdomen pelvis 08/16/2019. FINDINGS: Lower chest: Linear atelectasis or scarring is present at the left base. Lung bases are otherwise clear. The heart size is normal. No significant pleural or pericardial effusion is present. Hepatobiliary: No focal liver abnormality is seen. No gallstones, gallbladder wall thickening, or biliary dilatation. Pancreas: Pancreatic head within normal limits. Diffuse inflammatory changes are present throughout the body, similar the prior exam. Cysto gastrostomy tube is in place. The peripancreatic fluid collections have decreased in size. Increased wall thickening inflammatory changes are associated. There is gas within the more central collection. Spleen: Secondary inflammatory changes present at the inferior aspect of the spleen. Mild splenomegaly is noted. The spleen measures 13  mm cephalo caudad. Adrenals/Urinary Tract: The adrenal glands are normal bilaterally. Nonobstructing nephrolithiasis is again noted. Ureters are within normal limits. The urinary bladder is normal. Stomach/Bowel: Drainage catheter is in place in the stomach. Secondary inflammatory changes are present the duodenum. Distal small bowel is mostly collapsed without additional changes. Terminal ileum is within normal limits. Appendix is surgically absent. The ascending proximal transverse colon is. Diffuse inflammatory changes are present at the splenic flexure. Descending and sigmoid colon are normal. Vascular/Lymphatic: Atherosclerotic calcifications are present without aneurysm. No significant retroperitoneal adenopathy is present. Reproductive: Uterus and bilateral adnexa are unremarkable. Other: No abdominal wall hernia or abnormality. No abdominopelvic ascites. 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 Endplate changes are noted at L4-5. Fused anterior osteophytes are present the thoracic spine. No focal lytic or blastic lesions are present. Bony pelvis is within normal limits. Hips are located and within normal limits bilaterally. IMPRESSION: 1. 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 2. Wall thickening about the pseudocysts and increased inflammatory changes suggests secondary infection of the pseudocysts. 3. Gas within the pseudocysts is likely due to the drainage catheter. 4. Diffuse inflammatory changes throughout the body of the pancreas compatible with acute on chronic pancreatitis. 5. Secondary inflammatory changes of the colon at the splenic flexure and of the duodenum. 6. Nonobstructing nephrolithiasis. 7. 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 8. Aortic Atherosclerosis (ICD10-I70.0). Electronically Signed   By: Marin Roberts M.D.   On: 09/10/2019 05:20    Procedures Procedures (including critical  care time)  CRITICAL CARE Performed by: Shon Baton   Total critical care time: 45 minutes  Critical care time was exclusive of separately billable procedures and treating other patients.  Critical care was necessary to treat or prevent imminent or life-threatening deterioration.  Critical care was time spent personally by me on the following activities: development of treatment plan with patient and/or surrogate as well as nursing, discussions with consultants, evaluation of patient's response to treatment, examination of  patient, obtaining history from patient or surrogate, ordering and performing treatments and interventions, ordering and review of laboratory studies, ordering and review of radiographic studies, pulse oximetry and re-evaluation of patient's condition.   Medications Ordered in ED Medications  sodium chloride 0.9 % bolus 1,000 mL (has no administration in time range)  sodium chloride 0.9 % bolus 1,000 mL (0 mLs Intravenous Stopped 09/10/19 0417)  morphine 4 MG/ML injection 4 mg (4 mg Intravenous Given 09/10/19 0335)  ondansetron (ZOFRAN) injection 4 mg (4 mg Intravenous Given 09/10/19 0331)  sodium chloride 0.9 % bolus 1,000 mL (0 mLs Intravenous Stopped 09/10/19 0601)  iohexol (OMNIPAQUE) 300 MG/ML solution 100 mL (100 mLs Intravenous Contrast Given 09/10/19 0441)  insulin glargine (LANTUS) injection 30 Units (30 Units Subcutaneous Given 09/10/19 [id number])    ED Course  I have reviewed the triage vital signs and the nursing notes.  Pertinent labs & imaging results that were available during my care of the patient were reviewed by me and considered in my medical decision making (see chart for details).  Clinical Course as of Sep 09 609  Tue Sep 10, 2019  0541 On recheck, patient reports improvement of symptoms.  Abdominal exam is soft.  Will allow to trial fluids.  Given glucose, will order an additional liter of fluids.   [CH]    Clinical Course User Index [CH]  Jessie Cowher, Mayer Maskerourtney F, MD   MDM Rules/Calculators/A&P                       Patient presents with abdominal pain.  Recent complicated medical history including pancreatic pseudocysts necrotizing pancreatitis.  Patient is nontoxic and vital signs notable for tachycardia.  She is afebrile.  She has epigastric tenderness on exam without signs of peritonitis.  Patient was gluten fluids and pain medicine.  Lab work obtained.  No significant leukocytosis.  Normal lipase.  CMP is notable for blood glucose of 594 without anion gap.  Doubt DKA.  Patient was given a total of 2 L of fluid.  She states that she did not take her Lantus last night.  She was given 30 units of Lantus.  Sodium 127 and likely falsely low.  Given recent complications and multiple interventions, will obtain repeat CT scan.  Repeat CT shows evidence of chronic pancreatitis and secondary inflammatory changes.  Patient continues to take her Augmentin as instructed.  She has gas in her pseudocyst but this is likely related to the drainage catheters and she has had marked decompression of the pseudocyst.  Feel overall this is likely an improved CT.  She is afebrile and without significant leukocytosis.  Doubt acute infectious process.  She is now pain controlled.  She remains hyperglycemic but was given her Lantus and this will likely downtrend with fluid intake throughout the day.  Recommend close follow-up with her gastroenterologist to review CT.  After history, exam, and medical workup I feel the patient has been appropriately medically screened and is safe for discharge home. Pertinent diagnoses were discussed with the patient. Patient was given return precautions.   Final Clinical Impression(s) / ED Diagnoses Final diagnoses:  Epigastric pain  Other chronic pancreatitis (HCC)  Hyperglycemia    Rx / DC Orders ED Discharge Orders    None       Joanell Cressler, Mayer Maskerourtney F, MD 09/10/19 [id number]    Shon BatonHorton, Lavinia Mcneely F, MD 09/10/19 [phone]

## 2019-09-11 ENCOUNTER — Telehealth: Payer: Self-pay | Admitting: Gastroenterology

## 2019-09-11 NOTE — Telephone Encounter (Signed)
Patient called states she is in a lot of abdominal pain

## 2019-09-11 NOTE — Telephone Encounter (Signed)
Dr Meridee Score the pt is calling because she continues to experience abd pain that radiates to the left side.  She states it is an 8/10.  She is also calling her PCP regarding her diabetes.  Please advise any recommendations.

## 2019-09-12 NOTE — Telephone Encounter (Signed)
Reviewed patient's recent CT. Things improving overall but still needs follow up Necrosectomy next week. No lipase elevation on recent ED evaluation. Would like patient to trial Tramadol 25 mg Q6H PRN (24 tablets total no refills) for the next few days in effort of trying to help with her pain and get her to procedure If this is not helping then could consider a 3-day course of Oxycodone, but otherwise she needs to return to the hospital at that point.  Corliss Parish, MD [facility] Office # [phone]

## 2019-09-12 NOTE — Telephone Encounter (Signed)
No answer and no voice mail.

## 2019-09-13 NOTE — Telephone Encounter (Signed)
The pt was contacted and states she saw her PCP and was prescribed oxycodone.  She will keep EGD appt as planned and call back with any further concerns.

## 2019-09-14 ENCOUNTER — Other Ambulatory Visit (HOSPITAL_COMMUNITY): Payer: Medicare Other

## 2019-09-14 ENCOUNTER — Other Ambulatory Visit: Payer: Self-pay

## 2019-09-14 ENCOUNTER — Emergency Department (HOSPITAL_BASED_OUTPATIENT_CLINIC_OR_DEPARTMENT_OTHER): Payer: Medicare Other

## 2019-09-14 ENCOUNTER — Encounter (HOSPITAL_BASED_OUTPATIENT_CLINIC_OR_DEPARTMENT_OTHER): Payer: Self-pay

## 2019-09-14 ENCOUNTER — Inpatient Hospital Stay (HOSPITAL_BASED_OUTPATIENT_CLINIC_OR_DEPARTMENT_OTHER)
Admission: EM | Admit: 2019-09-14 | Discharge: 2019-09-19 | DRG: 405 | Disposition: A | Discharge status: Home / Self Care | Payer: Medicare Other | Attending: Family Medicine | Admitting: Family Medicine

## 2019-09-14 DIAGNOSIS — R1013 Epigastric pain: Secondary | ICD-10-CM | POA: Diagnosis not present

## 2019-09-14 DIAGNOSIS — Z794 Long term (current) use of insulin: Secondary | ICD-10-CM

## 2019-09-14 DIAGNOSIS — Z8619 Personal history of other infectious and parasitic diseases: Secondary | ICD-10-CM

## 2019-09-14 DIAGNOSIS — E876 Hypokalemia: Secondary | ICD-10-CM | POA: Diagnosis present

## 2019-09-14 DIAGNOSIS — Z9049 Acquired absence of other specified parts of digestive tract: Secondary | ICD-10-CM

## 2019-09-14 DIAGNOSIS — E785 Hyperlipidemia, unspecified: Secondary | ICD-10-CM | POA: Diagnosis present

## 2019-09-14 DIAGNOSIS — F319 Bipolar disorder, unspecified: Secondary | ICD-10-CM | POA: Diagnosis present

## 2019-09-14 DIAGNOSIS — E860 Dehydration: Secondary | ICD-10-CM

## 2019-09-14 DIAGNOSIS — K5900 Constipation, unspecified: Secondary | ICD-10-CM | POA: Diagnosis not present

## 2019-09-14 DIAGNOSIS — K219 Gastro-esophageal reflux disease without esophagitis: Secondary | ICD-10-CM | POA: Diagnosis present

## 2019-09-14 DIAGNOSIS — Z87442 Personal history of urinary calculi: Secondary | ICD-10-CM

## 2019-09-14 DIAGNOSIS — Z9851 Tubal ligation status: Secondary | ICD-10-CM

## 2019-09-14 DIAGNOSIS — Z98891 History of uterine scar from previous surgery: Secondary | ICD-10-CM

## 2019-09-14 DIAGNOSIS — K861 Other chronic pancreatitis: Secondary | ICD-10-CM | POA: Diagnosis present

## 2019-09-14 DIAGNOSIS — F209 Schizophrenia, unspecified: Secondary | ICD-10-CM | POA: Diagnosis present

## 2019-09-14 DIAGNOSIS — Z79891 Long term (current) use of opiate analgesic: Secondary | ICD-10-CM

## 2019-09-14 DIAGNOSIS — G4733 Obstructive sleep apnea (adult) (pediatric): Secondary | ICD-10-CM | POA: Diagnosis present

## 2019-09-14 DIAGNOSIS — R739 Hyperglycemia, unspecified: Secondary | ICD-10-CM

## 2019-09-14 DIAGNOSIS — Z9089 Acquired absence of other organs: Secondary | ICD-10-CM

## 2019-09-14 DIAGNOSIS — K863 Pseudocyst of pancreas: Principal | ICD-10-CM | POA: Diagnosis present

## 2019-09-14 DIAGNOSIS — E1100 Type 2 diabetes mellitus with hyperosmolarity without nonketotic hyperglycemic-hyperosmolar coma (NKHHC): Secondary | ICD-10-CM | POA: Diagnosis present

## 2019-09-14 DIAGNOSIS — Z20822 Contact with and (suspected) exposure to covid-19: Secondary | ICD-10-CM | POA: Diagnosis present

## 2019-09-14 DIAGNOSIS — Z6841 Body Mass Index (BMI) 40.0 and over, adult: Secondary | ICD-10-CM

## 2019-09-14 DIAGNOSIS — F419 Anxiety disorder, unspecified: Secondary | ICD-10-CM | POA: Diagnosis present

## 2019-09-14 DIAGNOSIS — R109 Unspecified abdominal pain: Secondary | ICD-10-CM | POA: Diagnosis present

## 2019-09-14 DIAGNOSIS — B3781 Candidal esophagitis: Secondary | ICD-10-CM | POA: Diagnosis present

## 2019-09-14 DIAGNOSIS — J45909 Unspecified asthma, uncomplicated: Secondary | ICD-10-CM | POA: Diagnosis present

## 2019-09-14 DIAGNOSIS — E66813 Obesity, class 3: Secondary | ICD-10-CM | POA: Diagnosis present

## 2019-09-14 DIAGNOSIS — Z79899 Other long term (current) drug therapy: Secondary | ICD-10-CM

## 2019-09-14 DIAGNOSIS — K8591 Acute pancreatitis with uninfected necrosis, unspecified: Secondary | ICD-10-CM | POA: Diagnosis present

## 2019-09-14 DIAGNOSIS — E1165 Type 2 diabetes mellitus with hyperglycemia: Secondary | ICD-10-CM | POA: Diagnosis present

## 2019-09-14 DIAGNOSIS — Z7951 Long term (current) use of inhaled steroids: Secondary | ICD-10-CM

## 2019-09-14 DIAGNOSIS — Z8719 Personal history of other diseases of the digestive system: Secondary | ICD-10-CM

## 2019-09-14 DIAGNOSIS — I10 Essential (primary) hypertension: Secondary | ICD-10-CM | POA: Diagnosis present

## 2019-09-14 DIAGNOSIS — T859XXA Unspecified complication of internal prosthetic device, implant and graft, initial encounter: Secondary | ICD-10-CM

## 2019-09-14 LAB — URINALYSIS, MICROSCOPIC (REFLEX)

## 2019-09-14 LAB — CBC WITH DIFFERENTIAL/PLATELET
Abs Immature Granulocytes: 0.07 10*3/uL (ref 0.00–0.07)
Basophils Absolute: 0 10*3/uL (ref 0.0–0.1)
Basophils Relative: 0 %
Eosinophils Absolute: 0.1 10*3/uL (ref 0.0–0.5)
Eosinophils Relative: 1 %
HCT: 32.5 % — ABNORMAL LOW (ref 36.0–46.0)
Hemoglobin: 9.9 g/dL — ABNORMAL LOW (ref 12.0–15.0)
Immature Granulocytes: 1 %
Lymphocytes Relative: 24 %
Lymphs Abs: 1.6 10*3/uL (ref 0.7–4.0)
MCH: 21.6 pg — ABNORMAL LOW (ref 26.0–34.0)
MCHC: 30.5 g/dL (ref 30.0–36.0)
MCV: 71 fL — ABNORMAL LOW (ref 80.0–100.0)
Monocytes Absolute: 1 10*3/uL (ref 0.1–1.0)
Monocytes Relative: 16 %
Neutro Abs: 3.9 10*3/uL (ref 1.7–7.7)
Neutrophils Relative %: 58 %
Platelets: 370 10*3/uL (ref 150–400)
RBC: 4.58 MIL/uL (ref 3.87–5.11)
RDW: 14.6 % (ref 11.5–15.5)
WBC: 6.7 10*3/uL (ref 4.0–10.5)
nRBC: 0 % (ref 0.0–0.2)

## 2019-09-14 LAB — URINALYSIS, ROUTINE W REFLEX MICROSCOPIC
Bilirubin Urine: NEGATIVE
Glucose, UA: >=500 mg/dL — AB
Ketones, ur: NEGATIVE mg/dL
Leukocytes,Ua: NEGATIVE
Nitrite: NEGATIVE
Protein, ur: NEGATIVE mg/dL
Specific Gravity, Urine: <1.005 — ABNORMAL LOW (ref 1.005–1.030)
pH: 5.5 (ref 5.0–8.0)

## 2019-09-14 IMAGING — DX DG ABDOMEN ACUTE W/ 1V CHEST
4 series · 4 of 4 positions shown · non-contrast
Comparison: [DATE]

CLINICAL DATA: Left-sided abdominal pain x3 days.

EXAM:
DG ABDOMEN ACUTE W/ 1V CHEST

[abdomen supine (1 of 2)]
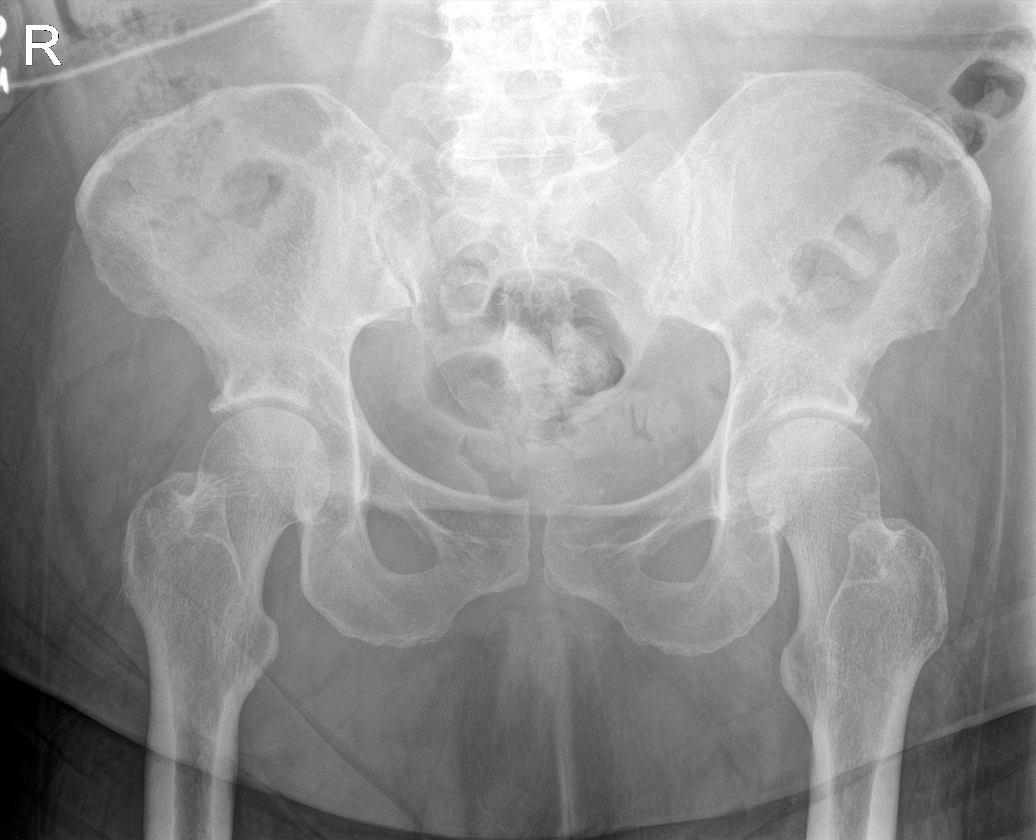

[chest pa]
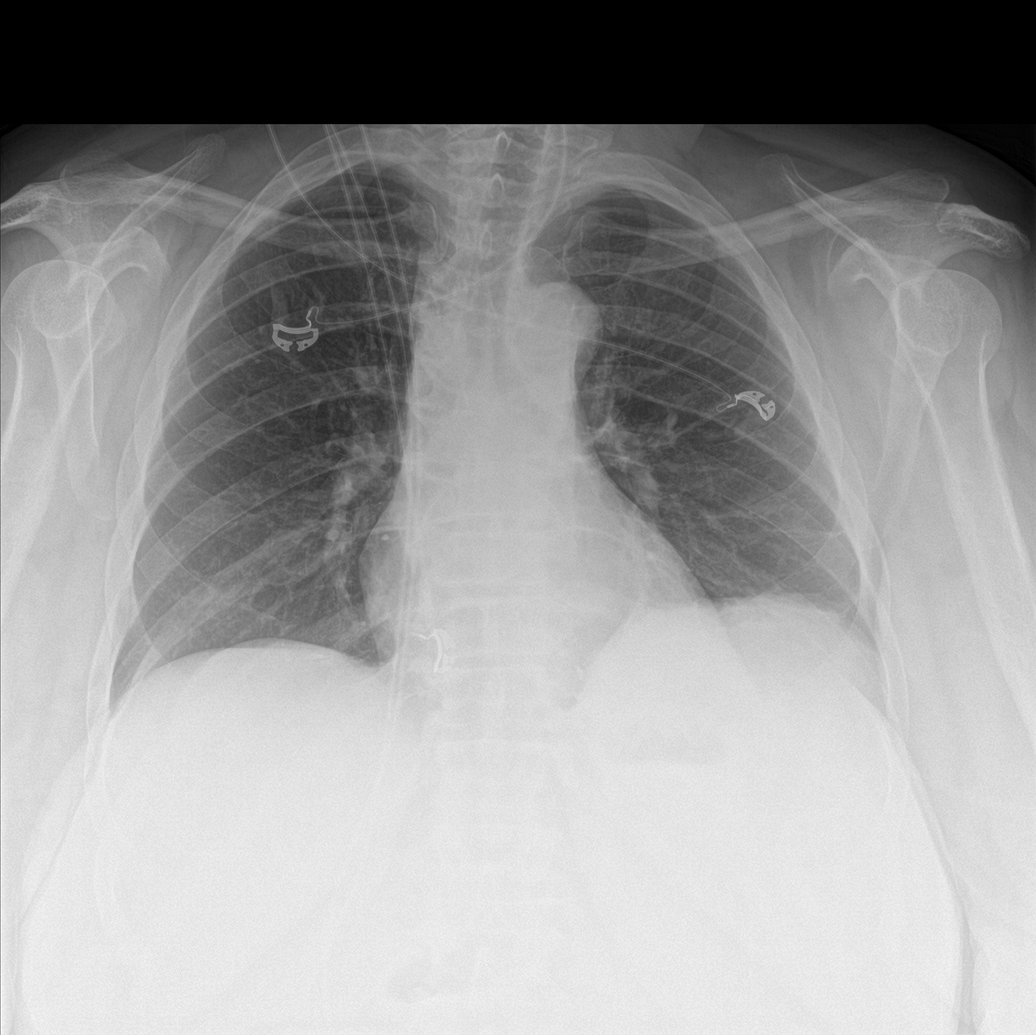

[abdomen erect]
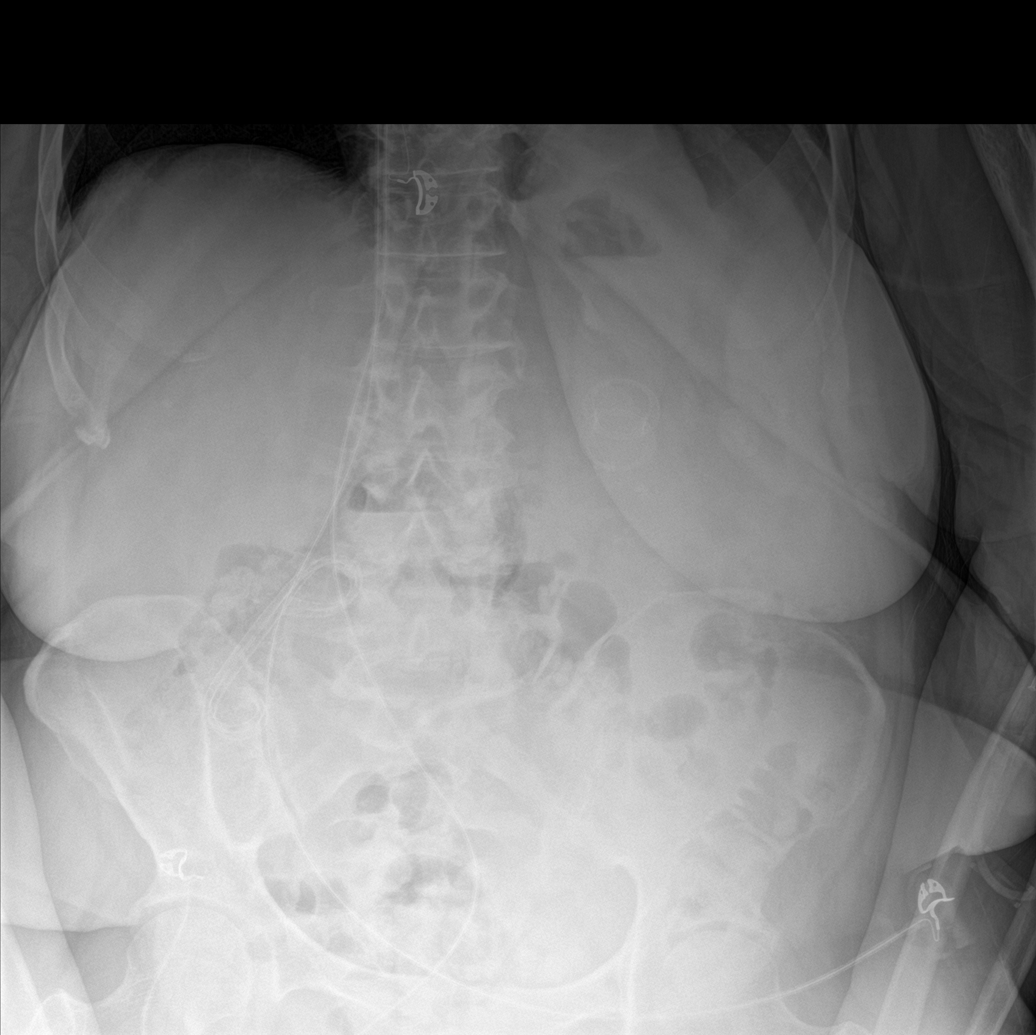

[abdomen supine (2 of 2)]
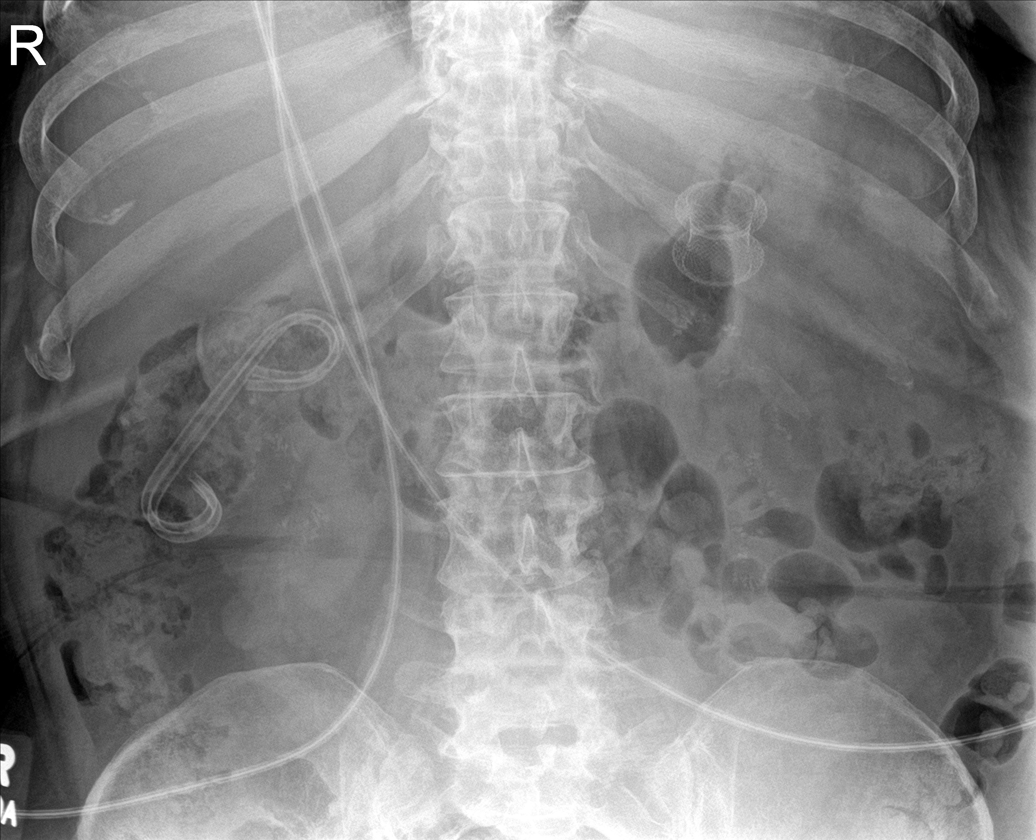

[4 of 4 positions shown; findings below may reference images not displayed]

FINDINGS: There is no evidence of dilated bowel loops or free intraperitoneal
air. A moderate amount of stool is seen. A radiopaque mesh like
stent is seen overlying the left upper quadrant with a curvilinear
radiopaque stent seen overlying the mid to upper right abdomen. No
radiopaque calculi or other significant radiographic abnormality is
seen. Heart size and mediastinal contours are within normal limits.
A very mild amount of atelectasis is noted within the left lung
base.
IMPRESSION: 1. No acute cardiopulmonary disease.
2. Moderate stool burden without evidence of bowel obstruction.

## 2019-09-14 MED ORDER — MORPHINE SULFATE (PF) 4 MG/ML IV SOLN
4.0000 mg | Freq: Once | INTRAVENOUS | Status: AC
  Administered 2019-09-14: 4 mg via INTRAVENOUS
  Filled 2019-09-14: qty 1

## 2019-09-14 MED ORDER — SODIUM CHLORIDE 0.9 % IV BOLUS
1000.0000 mL | Freq: Once | INTRAVENOUS | Status: AC
  Administered 2019-09-14: 1000 mL via INTRAVENOUS

## 2019-09-14 MED ORDER — ONDANSETRON HCL 4 MG/2ML IJ SOLN
4.0000 mg | Freq: Once | INTRAMUSCULAR | Status: AC
  Administered 2019-09-14: 4 mg via INTRAVENOUS
  Filled 2019-09-14: qty 2

## 2019-09-14 NOTE — ED Notes (Signed)
To x-ray

## 2019-09-14 NOTE — ED Triage Notes (Signed)
Pt c/o abd pain x 3 days. Pt was seen here on 4/20 for same. Today pt started today and described as achy and is located on the L side and is non-radiating. Pt reports associated N/V, ShOB.

## 2019-09-15 DIAGNOSIS — K8591 Acute pancreatitis with uninfected necrosis, unspecified: Secondary | ICD-10-CM | POA: Diagnosis not present

## 2019-09-15 DIAGNOSIS — R109 Unspecified abdominal pain: Secondary | ICD-10-CM | POA: Diagnosis not present

## 2019-09-15 DIAGNOSIS — B3781 Candidal esophagitis: Secondary | ICD-10-CM | POA: Diagnosis not present

## 2019-09-15 DIAGNOSIS — E1165 Type 2 diabetes mellitus with hyperglycemia: Secondary | ICD-10-CM | POA: Diagnosis not present

## 2019-09-15 DIAGNOSIS — Z9049 Acquired absence of other specified parts of digestive tract: Secondary | ICD-10-CM | POA: Diagnosis not present

## 2019-09-15 DIAGNOSIS — K5900 Constipation, unspecified: Secondary | ICD-10-CM | POA: Diagnosis not present

## 2019-09-15 DIAGNOSIS — E785 Hyperlipidemia, unspecified: Secondary | ICD-10-CM | POA: Diagnosis not present

## 2019-09-15 DIAGNOSIS — Z6841 Body Mass Index (BMI) 40.0 and over, adult: Secondary | ICD-10-CM | POA: Diagnosis not present

## 2019-09-15 DIAGNOSIS — K861 Other chronic pancreatitis: Secondary | ICD-10-CM | POA: Diagnosis not present

## 2019-09-15 DIAGNOSIS — Z20822 Contact with and (suspected) exposure to covid-19: Secondary | ICD-10-CM | POA: Diagnosis not present

## 2019-09-15 DIAGNOSIS — K863 Pseudocyst of pancreas: Secondary | ICD-10-CM | POA: Diagnosis not present

## 2019-09-15 DIAGNOSIS — Z9089 Acquired absence of other organs: Secondary | ICD-10-CM | POA: Diagnosis not present

## 2019-09-15 DIAGNOSIS — F319 Bipolar disorder, unspecified: Secondary | ICD-10-CM | POA: Diagnosis not present

## 2019-09-15 DIAGNOSIS — F419 Anxiety disorder, unspecified: Secondary | ICD-10-CM | POA: Diagnosis not present

## 2019-09-15 DIAGNOSIS — G4733 Obstructive sleep apnea (adult) (pediatric): Secondary | ICD-10-CM | POA: Diagnosis not present

## 2019-09-15 DIAGNOSIS — I10 Essential (primary) hypertension: Secondary | ICD-10-CM | POA: Diagnosis not present

## 2019-09-15 DIAGNOSIS — E876 Hypokalemia: Secondary | ICD-10-CM | POA: Diagnosis not present

## 2019-09-15 DIAGNOSIS — E1100 Type 2 diabetes mellitus with hyperosmolarity without nonketotic hyperglycemic-hyperosmolar coma (NKHHC): Secondary | ICD-10-CM | POA: Diagnosis present

## 2019-09-15 DIAGNOSIS — Z9851 Tubal ligation status: Secondary | ICD-10-CM | POA: Diagnosis not present

## 2019-09-15 DIAGNOSIS — Z8619 Personal history of other infectious and parasitic diseases: Secondary | ICD-10-CM | POA: Diagnosis not present

## 2019-09-15 DIAGNOSIS — K219 Gastro-esophageal reflux disease without esophagitis: Secondary | ICD-10-CM | POA: Diagnosis not present

## 2019-09-15 DIAGNOSIS — R1013 Epigastric pain: Secondary | ICD-10-CM | POA: Diagnosis present

## 2019-09-15 DIAGNOSIS — J45909 Unspecified asthma, uncomplicated: Secondary | ICD-10-CM | POA: Diagnosis not present

## 2019-09-15 DIAGNOSIS — Z87442 Personal history of urinary calculi: Secondary | ICD-10-CM | POA: Diagnosis not present

## 2019-09-15 DIAGNOSIS — F209 Schizophrenia, unspecified: Secondary | ICD-10-CM | POA: Diagnosis not present

## 2019-09-15 LAB — CBG MONITORING, ED
Glucose-Capillary: 575 mg/dL (ref 70–99)
Glucose-Capillary: 392 mg/dL — ABNORMAL HIGH (ref 70–99)
Glucose-Capillary: 577 mg/dL (ref 70–99)
Glucose-Capillary: 228 mg/dL — ABNORMAL HIGH (ref 70–99)
Glucose-Capillary: 193 mg/dL — ABNORMAL HIGH (ref 70–99)
Glucose-Capillary: 182 mg/dL — ABNORMAL HIGH (ref 70–99)
Glucose-Capillary: 192 mg/dL — ABNORMAL HIGH (ref 70–99)
Glucose-Capillary: 196 mg/dL — ABNORMAL HIGH (ref 70–99)
Glucose-Capillary: 190 mg/dL — ABNORMAL HIGH (ref 70–99)
Glucose-Capillary: 163 mg/dL — ABNORMAL HIGH (ref 70–99)
Glucose-Capillary: 152 mg/dL — ABNORMAL HIGH (ref 70–99)
Glucose-Capillary: 157 mg/dL — ABNORMAL HIGH (ref 70–99)
Glucose-Capillary: 207 mg/dL — ABNORMAL HIGH (ref 70–99)

## 2019-09-15 LAB — BASIC METABOLIC PANEL
Anion gap: 8 (ref 5–15)
Anion gap: 11 (ref 5–15)
BUN: 6 mg/dL (ref 6–20)
BUN: 6 mg/dL (ref 6–20)
CO2: 25 mmol/L (ref 22–32)
CO2: 22 mmol/L (ref 22–32)
Calcium: 8.6 mg/dL — ABNORMAL LOW (ref 8.9–10.3)
Calcium: 8.2 mg/dL — ABNORMAL LOW (ref 8.9–10.3)
Chloride: 98 mmol/L (ref 98–111)
Chloride: 99 mmol/L (ref 98–111)
Creatinine, Ser: 0.68 mg/dL (ref 0.44–1.00)
Creatinine, Ser: 0.71 mg/dL (ref 0.44–1.00)
GFR calc Af Amer: >60 mL/min (ref >60)
GFR calc Af Amer: >60 mL/min (ref >60)
GFR calc non Af Amer: >60 mL/min (ref >60)
GFR calc non Af Amer: >60 mL/min (ref >60)
Glucose, Bld: 227 mg/dL — ABNORMAL HIGH (ref 70–99)
Glucose, Bld: 210 mg/dL — ABNORMAL HIGH (ref 70–99)
Potassium: 3.1 mmol/L — ABNORMAL LOW (ref 3.5–5.1)
Potassium: 3.3 mmol/L — ABNORMAL LOW (ref 3.5–5.1)
Sodium: 131 mmol/L — ABNORMAL LOW (ref 135–145)
Sodium: 132 mmol/L — ABNORMAL LOW (ref 135–145)

## 2019-09-15 LAB — COMPREHENSIVE METABOLIC PANEL
ALT: 9 U/L (ref 0–44)
AST: 14 U/L — ABNORMAL LOW (ref 15–41)
Albumin: 3 g/dL — ABNORMAL LOW (ref 3.5–5.0)
Alkaline Phosphatase: 96 U/L (ref 38–126)
Anion gap: 14 (ref 5–15)
BUN: 7 mg/dL (ref 6–20)
CO2: 25 mmol/L (ref 22–32)
Calcium: 8.6 mg/dL — ABNORMAL LOW (ref 8.9–10.3)
Chloride: 89 mmol/L — ABNORMAL LOW (ref 98–111)
Creatinine, Ser: 0.91 mg/dL (ref 0.44–1.00)
GFR calc Af Amer: >60 mL/min (ref >60)
GFR calc non Af Amer: >60 mL/min (ref >60)
Glucose, Bld: 682 mg/dL (ref 70–99)
Potassium: 3.7 mmol/L (ref 3.5–5.1)
Sodium: 128 mmol/L — ABNORMAL LOW (ref 135–145)
Total Bilirubin: 0.5 mg/dL (ref 0.3–1.2)
Total Protein: 7.7 g/dL (ref 6.5–8.1)

## 2019-09-15 LAB — CBC
HCT: 34 % — ABNORMAL LOW (ref 36.0–46.0)
Hemoglobin: 9.9 g/dL — ABNORMAL LOW (ref 12.0–15.0)
MCH: 21.7 pg — ABNORMAL LOW (ref 26.0–34.0)
MCHC: 29.1 g/dL — ABNORMAL LOW (ref 30.0–36.0)
MCV: 74.6 fL — ABNORMAL LOW (ref 80.0–100.0)
Platelets: 346 10*3/uL (ref 150–400)
RBC: 4.56 MIL/uL (ref 3.87–5.11)
RDW: 14.9 % (ref 11.5–15.5)
WBC: 8 10*3/uL (ref 4.0–10.5)
nRBC: 0 % (ref 0.0–0.2)

## 2019-09-15 LAB — CREATININE, SERUM
Creatinine, Ser: 0.73 mg/dL (ref 0.44–1.00)
GFR calc Af Amer: >60 mL/min (ref >60)
GFR calc non Af Amer: >60 mL/min (ref >60)

## 2019-09-15 LAB — SARS CORONAVIRUS 2 (TAT 6-24 HRS): SARS Coronavirus 2: NEGATIVE

## 2019-09-15 LAB — GLUCOSE, CAPILLARY
Glucose-Capillary: 182 mg/dL — ABNORMAL HIGH (ref 70–99)
Glucose-Capillary: 214 mg/dL — ABNORMAL HIGH (ref 70–99)
Glucose-Capillary: 234 mg/dL — ABNORMAL HIGH (ref 70–99)

## 2019-09-15 LAB — TROPONIN I (HIGH SENSITIVITY): Troponin I (High Sensitivity): 5 ng/L (ref <18)

## 2019-09-15 LAB — LIPASE, BLOOD: Lipase: 14 U/L (ref 11–51)

## 2019-09-15 MED ORDER — LACTATED RINGERS IV SOLN: INTRAVENOUS | Status: DC

## 2019-09-15 MED ORDER — ONDANSETRON HCL 4 MG/2ML IJ SOLN
4.0000 mg | Freq: Four times a day (QID) | INTRAMUSCULAR | Status: DC | PRN
  Administered 2019-09-16 – 2019-09-17 (×2): 4 mg via INTRAVENOUS
  Filled 2019-09-15 (×2): qty 2

## 2019-09-15 MED ORDER — ONDANSETRON HCL 4 MG PO TABS: 8.0000 mg | ORAL_TABLET | Freq: Three times a day (TID) | ORAL | Status: DC | PRN

## 2019-09-15 MED ORDER — INSULIN DETEMIR 100 UNIT/ML ~~LOC~~ SOLN
12.0000 [IU] | Freq: Two times a day (BID) | SUBCUTANEOUS | Status: DC
  Administered 2019-09-15 – 2019-09-17 (×5): 12 [IU] via SUBCUTANEOUS
  Filled 2019-09-15: qty 1
  Filled 2019-09-15 (×4): qty 0.12

## 2019-09-15 MED ORDER — LURASIDONE HCL 60 MG PO TABS
1.0000 | ORAL_TABLET | Freq: Every day | ORAL | Status: DC
  Administered 2019-09-15 – 2019-09-18 (×4): 60 mg via ORAL
  Filled 2019-09-15 (×4): qty 1

## 2019-09-15 MED ORDER — LAMOTRIGINE 100 MG PO TABS
200.0000 mg | ORAL_TABLET | Freq: Every day | ORAL | Status: DC
  Administered 2019-09-16 – 2019-09-19 (×4): 200 mg via ORAL
  Filled 2019-09-15 (×4): qty 2

## 2019-09-15 MED ORDER — LOSARTAN POTASSIUM 25 MG PO TABS
25.0000 mg | ORAL_TABLET | Freq: Every day | ORAL | Status: DC
  Administered 2019-09-16 – 2019-09-19 (×4): 25 mg via ORAL
  Filled 2019-09-15 (×4): qty 1

## 2019-09-15 MED ORDER — ONDANSETRON HCL 4 MG PO TABS: 4.0000 mg | ORAL_TABLET | Freq: Four times a day (QID) | ORAL | Status: DC | PRN

## 2019-09-15 MED ORDER — ENOXAPARIN SODIUM 60 MG/0.6ML ~~LOC~~ SOLN
60.0000 mg | SUBCUTANEOUS | Status: DC
  Administered 2019-09-15 – 2019-09-17 (×3): 60 mg via SUBCUTANEOUS
  Filled 2019-09-15 (×3): qty 0.6

## 2019-09-15 MED ORDER — DEXTROSE 50 % IV SOLN: 0.0000 mL | INTRAVENOUS | Status: DC | PRN

## 2019-09-15 MED ORDER — INSULIN REGULAR(HUMAN) IN NACL 100-0.9 UT/100ML-% IV SOLN
INTRAVENOUS | Status: DC
  Administered 2019-09-15: 23 [IU]/h via INTRAVENOUS
  Administered 2019-09-15: 02:00:00 12 [IU]/h via INTRAVENOUS
  Administered 2019-09-15: 01:00:00 18 [IU]/h via INTRAVENOUS
  Filled 2019-09-15 (×2): qty 100

## 2019-09-15 MED ORDER — HYDROCHLOROTHIAZIDE 12.5 MG PO CAPS: 12.5000 mg | ORAL_CAPSULE | Freq: Every day | ORAL | Status: DC

## 2019-09-15 MED ORDER — INSULIN ASPART 100 UNIT/ML ~~LOC~~ SOLN
3.0000 [IU] | SUBCUTANEOUS | Status: DC
  Administered 2019-09-15 (×2): 9 [IU] via SUBCUTANEOUS
  Administered 2019-09-15: 13:00:00 6 [IU] via SUBCUTANEOUS
  Administered 2019-09-15: 9 [IU] via SUBCUTANEOUS
  Administered 2019-09-16: 3 [IU] via SUBCUTANEOUS
  Administered 2019-09-16 – 2019-09-17 (×3): 6 [IU] via SUBCUTANEOUS
  Administered 2019-09-17 (×2): 3 [IU] via SUBCUTANEOUS
  Filled 2019-09-15 (×2): qty 1

## 2019-09-15 MED ORDER — POTASSIUM CHLORIDE CRYS ER 20 MEQ PO TBCR
40.0000 meq | EXTENDED_RELEASE_TABLET | Freq: Once | ORAL | Status: AC
  Administered 2019-09-15: 13:00:00 40 meq via ORAL
  Filled 2019-09-15: qty 2

## 2019-09-15 MED ORDER — AMITRIPTYLINE HCL 25 MG PO TABS
75.0000 mg | ORAL_TABLET | Freq: Every day | ORAL | Status: DC
  Administered 2019-09-15 – 2019-09-18 (×4): 75 mg via ORAL
  Filled 2019-09-15 (×4): qty 3

## 2019-09-15 MED ORDER — PANTOPRAZOLE SODIUM 40 MG PO TBEC
40.0000 mg | DELAYED_RELEASE_TABLET | Freq: Two times a day (BID) | ORAL | Status: DC
  Administered 2019-09-15 – 2019-09-17 (×4): 40 mg via ORAL
  Filled 2019-09-15 (×4): qty 1

## 2019-09-15 MED ORDER — HYDROMORPHONE HCL 1 MG/ML IJ SOLN
0.5000 mg | INTRAMUSCULAR | Status: DC | PRN
  Administered 2019-09-16: 19:00:00 0.5 mg via INTRAVENOUS
  Filled 2019-09-15: qty 1

## 2019-09-15 MED ORDER — ALBUTEROL SULFATE (2.5 MG/3ML) 0.083% IN NEBU: 3.0000 mL | INHALATION_SOLUTION | Freq: Four times a day (QID) | RESPIRATORY_TRACT | Status: DC | PRN

## 2019-09-15 MED ORDER — DEXTROSE-NACL 5-0.45 % IV SOLN: INTRAVENOUS | Status: DC

## 2019-09-15 MED ORDER — MORPHINE SULFATE (PF) 4 MG/ML IV SOLN
4.0000 mg | Freq: Once | INTRAVENOUS | Status: AC
  Administered 2019-09-15: 07:00:00 4 mg via INTRAVENOUS
  Filled 2019-09-15: qty 1

## 2019-09-15 MED ORDER — BOOST / RESOURCE BREEZE PO LIQD CUSTOM: 1.0000 | Freq: Three times a day (TID) | ORAL | Status: DC

## 2019-09-15 MED ORDER — SODIUM CHLORIDE 0.9 % IV BOLUS: 1000.0000 mL | Freq: Once | INTRAVENOUS | Status: DC

## 2019-09-15 MED ORDER — OXYCODONE HCL 5 MG PO TABS
10.0000 mg | ORAL_TABLET | ORAL | Status: DC | PRN
  Administered 2019-09-17 – 2019-09-18 (×2): 10 mg via ORAL
  Filled 2019-09-15 (×2): qty 2

## 2019-09-15 MED ORDER — PROPRANOLOL HCL 20 MG PO TABS
20.0000 mg | ORAL_TABLET | Freq: Two times a day (BID) | ORAL | Status: DC
  Administered 2019-09-15 – 2019-09-19 (×8): 20 mg via ORAL
  Filled 2019-09-15 (×8): qty 1

## 2019-09-15 MED ORDER — LISINOPRIL-HYDROCHLOROTHIAZIDE 20-12.5 MG PO TABS: 1.0000 | ORAL_TABLET | Freq: Every day | ORAL | Status: DC

## 2019-09-15 MED ORDER — CLONAZEPAM 0.5 MG PO TABS
0.5000 mg | ORAL_TABLET | Freq: Every day | ORAL | Status: DC
  Administered 2019-09-15 – 2019-09-19 (×5): 0.5 mg via ORAL
  Filled 2019-09-15 (×5): qty 1

## 2019-09-15 MED ORDER — TRAZODONE HCL 100 MG PO TABS
100.0000 mg | ORAL_TABLET | Freq: Every day | ORAL | Status: DC
  Administered 2019-09-15 – 2019-09-18 (×4): 100 mg via ORAL
  Filled 2019-09-15 (×4): qty 1

## 2019-09-15 MED ORDER — LISINOPRIL 20 MG PO TABS: 20.0000 mg | ORAL_TABLET | Freq: Every day | ORAL | Status: DC

## 2019-09-15 MED ORDER — POTASSIUM CHLORIDE CRYS ER 20 MEQ PO TBCR
40.0000 meq | EXTENDED_RELEASE_TABLET | Freq: Once | ORAL | Status: AC
  Administered 2019-09-15: 40 meq via ORAL
  Filled 2019-09-15: qty 2

## 2019-09-15 MED ORDER — CIPROFLOXACIN HCL 500 MG PO TABS
500.0000 mg | ORAL_TABLET | Freq: Two times a day (BID) | ORAL | Status: DC
  Administered 2019-09-15 – 2019-09-17 (×5): 500 mg via ORAL
  Filled 2019-09-15 (×5): qty 1

## 2019-09-15 MED ORDER — SODIUM CHLORIDE 0.9 % IV SOLN: INTRAVENOUS | Status: DC

## 2019-09-15 MED ORDER — ATORVASTATIN CALCIUM 40 MG PO TABS
40.0000 mg | ORAL_TABLET | Freq: Every day | ORAL | Status: DC
  Administered 2019-09-16 – 2019-09-19 (×4): 40 mg via ORAL
  Filled 2019-09-15 (×4): qty 1

## 2019-09-15 MED ORDER — AMLODIPINE BESYLATE 5 MG PO TABS
5.0000 mg | ORAL_TABLET | Freq: Every day | ORAL | Status: DC
  Administered 2019-09-16 – 2019-09-19 (×4): 5 mg via ORAL
  Filled 2019-09-15 (×4): qty 1

## 2019-09-15 NOTE — H&P (Signed)
History and Physical   Monique Holland JKD:[id number] DOB: 1966-01-18 DOA: 09/14/2019  Referring MD/NP/PA: Dr. Wilkie Aye  PCP: Knox Royalty, MD   Outpatient Specialists: Dr. Meridee Score, GI  Patient coming from: Home  Chief Complaint: Abdominal pain and chest pain  HPI: Monique Holland is a 54 y.o. female with medical history significant of necrotizing pancreatitis, diabetes, hypertension, pancreatic pseudocyst, morbid obesity, bipolar disorder, obstructive sleep apnea who presented to the ER with abdominal pain rated as 8 out of 10.  Pain is constant and squeezing.  Not relieved by her home regimen.  Patient was also found to be hyperglycemic initiated on some insulin drip initially.  She has been on tramadol and oxycodone at home not controlling her pain.  CT scan shows persistent but improved changes in the pancreatic region.  Start on IV pain medicine and is being admitted for pain control.  Patient is scheduled to have a procedure by Dr. Meridee Score on April 28.  She denied any fever or chills no melena no bright red blood per rectum..  ED Course: Temperature 99.4 blood pressure 161/91 pulse 126 respiratory of 30 oxygen sat 96% on room air.  White count 8.0 hemoglobin 9.9 and platelet 346.  Sodium 132 potassium 3.3 chloride 99 CO2 22 glucose 210 creatinine 0.70.  Chest x-ray showed no acute cardiopulmonary disease.  CT abdomen pelvis on the 20 is as above.  Patient being admitted with abdominal pain for pain management  Review of Systems: As per HPI otherwise 10 point review of systems negative.    Past Medical History:  Diagnosis Date  . Anxiety   . Asthma   . Bipolar disorder (HCC)   . Depression   . Diabetes mellitus without complication (HCC)   . Frequency of urination   . GERD (gastroesophageal reflux disease)   . History of asthma    last episode yrs ago  . Hypertension   . OSA (obstructive sleep apnea)    pt had study done oct 2014--  schedule for cpap titrate after kidney  stone surgery  . Pancreatitis   . Pseudocyst of pancreas   . Schizophrenia (HCC)   . Ureteral calculi    BILATERAL  . Wears glasses     Past Surgical History:  Procedure Laterality Date  . BALLOON DILATION N/A 08/23/2019   Procedure: BALLOON DILATION;  Surgeon: Meridee Score Netty Starring., MD;  Location: [facility];  Service: Gastroenterology;  Laterality: N/A;  . BIOPSY  07/18/2019   Procedure: BIOPSY;  Surgeon: Shellia Cleverly, DO;  Location: [facility];  Service: Gastroenterology;;  . BIOPSY  08/23/2019   Procedure: BIOPSY;  Surgeon: Lemar Lofty., MD;  Location: [facility];  Service: Gastroenterology;;  . CESAREAN SECTION  1991  &  2002   w/ bilateral tubal ligation in 2002  . CYSTOSCOPY W/ URETERAL STENT PLACEMENT Right 08/10/2018   Procedure: CYSTOSCOPY WITH RETROGRADE PYELOGRAM/URETERAL STENT PLACEMENT;  Surgeon: Jerilee Field, MD;  Location: [facility];  Service: Urology;  Laterality: Right;  . CYSTOSCOPY WITH RETROGRADE PYELOGRAM, URETEROSCOPY AND STENT PLACEMENT Bilateral 05/02/2013   Procedure: CYSTOSCOPY WITH RETROGRADE PYELOGRAM, URETEROSCOPY ;  Surgeon: Valetta Fuller, MD;  Location: [facility];  Service: Urology;  Laterality: Bilateral;  . CYSTOSCOPY WITH RETROGRADE PYELOGRAM, URETEROSCOPY AND STENT PLACEMENT Right 08/15/2018   Procedure: CYSTOSCOPY WITH RIGHT RETROGRADE PYELOGRAM, RIGHT URETEROSCOPY WITH HOLMIUM LASER AND STENT PLACEMENT;  Surgeon: Crista Elliot, MD;  Location: [facility];  Service: Urology;  Laterality: Right;  . CYSTOSCOPY WITH  STENT PLACEMENT Left 05/02/2013   Procedure: CYSTOSCOPY WITH STENT PLACEMENT;  Surgeon: Bernestine Amass, MD;  Location[facility];  Service: Urology;  Laterality: Left;  . ENDOROTOR N/A 08/26/2019   Procedure: BWIOMBTDH;  Surgeon: Mansouraty, Telford Nab., MD;  Location: [place];  Service: Gastroenterology;  Laterality: N/A;  . ENDOROTOR  09/05/2019   Procedure: [id number];  Surgeon:  Mansouraty, Telford Nab., MD;  Location: [place];  Service: Gastroenterology;;  . ESOPHAGOGASTRODUODENOSCOPY N/A 08/26/2019   Procedure: ESOPHAGOGASTRODUODENOSCOPY (EGD) with NECROSECTOMY;  Surgeon: Irving Copas., MD;  Location: [place];  Service: Gastroenterology;  Laterality: N/A;  . ESOPHAGOGASTRODUODENOSCOPY (EGD) WITH PROPOFOL N/A 07/18/2019   Procedure: ESOPHAGOGASTRODUODENOSCOPY (EGD) WITH PROPOFOL;  Surgeon: Lavena Bullion, DO;  Location: [facility];  Service: Gastroenterology;  Laterality: N/A;  . ESOPHAGOGASTRODUODENOSCOPY (EGD) WITH PROPOFOL N/A 08/18/2019   Procedure: ESOPHAGOGASTRODUODENOSCOPY (EGD) WITH PROPOFOL;  Surgeon: Irene Shipper, MD;  Location: [facility];  Service: Endoscopy;  Laterality: N/A;  . ESOPHAGOGASTRODUODENOSCOPY (EGD) WITH PROPOFOL N/A 08/21/2019   Procedure: ESOPHAGOGASTRODUODENOSCOPY (EGD) WITH PROPOFOL;  Surgeon: Rush Landmark Telford Nab., MD;  Location: [facility];  Service: Gastroenterology;  Laterality: N/A;  . ESOPHAGOGASTRODUODENOSCOPY (EGD) WITH PROPOFOL N/A 08/23/2019   Procedure: ESOPHAGOGASTRODUODENOSCOPY (EGD) WITH PROPOFOL;  Surgeon: Rush Landmark Telford Nab., MD;  Location: [facility];  Service: Gastroenterology;  Laterality: N/A;  . ESOPHAGOGASTRODUODENOSCOPY (EGD) WITH PROPOFOL N/A 09/05/2019   Procedure: ESOPHAGOGASTRODUODENOSCOPY (EGD) WITH PROPOFOL;  Surgeon: Rush Landmark Telford Nab., MD;  Location: [place];  Service: Gastroenterology;  Laterality: N/A;   With pancreatic pseudocyst necrosectomy via established cyst gastrostomy  . EUS  08/23/2019   Procedure: UPPER ENDOSCOPIC ULTRASOUND (EUS) LINEAR;  Surgeon: Irving Copas., MD;  Location: [facility];  Service: Gastroenterology;;  . FOREIGN BODY REMOVAL  09/05/2019   Procedure: FOREIGN BODY REMOVAL;  Surgeon: Irving Copas., MD;  Location: [place];  Service: Gastroenterology;;  . HOLMIUM LASER APPLICATION Bilateral 64/68/0321   Procedure: HOLMIUM  LASER APPLICATION;  Surgeon: Bernestine Amass, MD;  Location: [facility];  Service: Urology;  Laterality: Bilateral;  . LAPAROSCOPIC APPENDECTOMY N/A 05/22/2014   Procedure: APPENDECTOMY LAPAROSCOPIC;  Surgeon: Excell Seltzer, MD;  Location: [facility];  Service: General;  Laterality: N/A;  . PANCREATIC STENT PLACEMENT  08/23/2019   Procedure: PANCREATIC STENT PLACEMENT;  Surgeon: Irving Copas., MD;  Location: [facility];  Service: Gastroenterology;;  . PANCREATIC STENT PLACEMENT  09/05/2019   Procedure: PANCREATIC STENT PLACEMENT;  Surgeon: Irving Copas., MD;  Location: [place];  Service: Gastroenterology;;  . Lavell Islam REMOVAL  08/26/2019   Procedure: STENT REMOVAL;  Surgeon: Irving Copas., MD;  Location: [place];  Service: Gastroenterology;;  . Lavell Islam REMOVAL  09/05/2019   Procedure: STENT REMOVAL;  Surgeon: Irving Copas., MD;  Location: [place];  Service: Gastroenterology;;  . TONSILLECTOMY  age 41  . UPPER ESOPHAGEAL ENDOSCOPIC ULTRASOUND (EUS) N/A 08/21/2019   Procedure: UPPER ESOPHAGEAL ENDOSCOPIC ULTRASOUND (EUS);  Surgeon: Irving Copas., MD;  Location: [facility];  Service: Gastroenterology;  Laterality: N/A;  For cyst gastrostomy     reports that she has never smoked. She has never used smokeless tobacco. She reports that she does not drink alcohol or use drugs.  No Known Allergies  Family History  Family history unknown: Yes     Prior to Admission medications   Medication Sig Start Date End Date Taking? Authorizing Provider  albuterol (VENTOLIN HFA) 108 (90 Base) MCG/ACT inhaler Inhale 1-2 puffs into the lungs every 6 (six) hours  as needed for wheezing or shortness of breath. 07/21/19  Yes Regalado, Belkys A, MD  amitriptyline (ELAVIL) 75 MG tablet Take 75 mg by mouth at bedtime. 08/10/19  Yes [provider]  amLODipine (NORVASC) 5 MG tablet Take 5 mg by mouth daily.   Yes [provider]   atorvastatin (LIPITOR) 40 MG tablet Take 40 mg by mouth daily.   Yes [provider]  ciprofloxacin (CIPRO) 500 MG tablet Take 1 tablet (500 mg total) by mouth 2 (two) times daily for 14 days. 09/05/19 09/19/19 Yes Mansouraty, Netty StarringGabriel Jr., MD  clonazePAM (KLONOPIN) 0.5 MG tablet Take 0.5 mg by mouth daily.  05/23/19  Yes [provider]  lamoTRIgine (LAMICTAL) 200 MG tablet Take 200 mg by mouth daily. 06/06/19  Yes [provider]  LANTUS SOLOSTAR 100 UNIT/ML Solostar Pen Inject 15 Units into the skin at bedtime. Patient taking differently: Inject 40 Units into the skin at bedtime.  07/21/19  Yes Regalado, Belkys A, MD  LATUDA 60 MG TABS Take 1 tablet by mouth at bedtime. 08/10/19  Yes [provider]  lisinopril-hydrochlorothiazide (ZESTORETIC) 20-12.5 MG tablet Take 1 tablet by mouth in the morning and at bedtime.    Yes [provider]  losartan (COZAAR) 25 MG tablet Take 25 mg by mouth daily.   Yes [provider]  ondansetron (ZOFRAN) 8 MG tablet Take 1 tablet (8 mg total) by mouth every 8 (eight) hours as needed for nausea or vomiting. 08/19/19  Yes Regalado, Belkys A, MD  Oxycodone HCl 10 MG TABS Take 10 mg by mouth every 4 (four) hours as needed (pain).  08/12/19  Yes [provider]  pantoprazole (PROTONIX) 40 MG tablet Take 40 mg by mouth 2 (two) times daily.   Yes [provider]  PREMARIN vaginal cream Place 1 Applicatorful vaginally daily as needed (irritation).  08/10/19  Yes [provider]  propranolol (INDERAL) 20 MG tablet Take 20 mg by mouth 2 (two) times daily.   Yes [provider]  traZODone (DESYREL) 100 MG tablet Take 100 mg by mouth at bedtime.   Yes [provider]  TRULICITY 4.5 MG/0.5ML SOPN Inject 4.5 mg into the skin every Wednesday.  07/29/19  Yes [provider]  metoprolol tartrate (LOPRESSOR) 25 MG tablet Take 1 tablet (25 mg total) by mouth 2 (two) times  daily. Patient not taking: Reported on 09/15/2019 08/19/19   Regalado, Jon BillingsBelkys A, MD  senna (SENOKOT) 8.6 MG TABS tablet Take 1 tablet (8.6 mg total) by mouth 2 (two) times daily. Patient not taking: Reported on 09/15/2019 07/21/19   Alba Coryegalado, Belkys A, MD    Physical Exam: Vitals:   09/15/19 1608 09/15/19 1630 09/15/19 1652 09/15/19 1829  BP: (!) 156/97   (!) 144/89  Pulse: (!) 126 (!) 118  (!) 122  Resp: 18 16  18   Temp:   99 F (37.2 C) 99.4 F (37.4 C)  TempSrc:   Oral Oral  SpO2: 96% 96%  98%  Weight:      Height:          Constitutional: Obese, in obvious distress and pain Vitals:   09/15/19 1608 09/15/19 1630 09/15/19 1652 09/15/19 1829  BP: (!) 156/97   (!) 144/89  Pulse: (!) 126 (!) 118  (!) 122  Resp: 18 16  18   Temp:   99 F (37.2 C) 99.4 F (37.4 C)  TempSrc:   Oral Oral  SpO2: 96% 96%  98%  Weight:  Height:       Eyes: PERRL, lids and conjunctivae normal ENMT: Mucous membranes are moist. Posterior pharynx clear of any exudate or lesions.Normal dentition.  Neck: normal, supple, no masses, no thyromegaly Respiratory: clear to auscultation bilaterally, no wheezing, no crackles. Normal respiratory effort. No accessory muscle use.  Cardiovascular: Regular rate and rhythm, no murmurs / rubs / gallops. No extremity edema. 2+ pedal pulses. No carotid bruits.  Abdomen: Diffuse tenderness, no masses palpated. No hepatosplenomegaly. Bowel sounds positive.  Musculoskeletal: no clubbing / cyanosis. No joint deformity upper and lower extremities. Good ROM, no contractures. Normal muscle tone.  Skin: no rashes, lesions, ulcers. No induration Neurologic: CN 2-12 grossly intact. Sensation intact, DTR normal. Strength 5/5 in all 4.  Psychiatric: Normal judgment and insight. Alert and oriented x 3. Normal mood.     Labs on Admission: I have personally reviewed following labs and imaging studies  CBC: Recent Labs  Lab 09/10/19 0313 09/14/19 2328  WBC 6.2 6.7   NEUTROABS 4.1 3.9  HGB 9.7* 9.9*  HCT 32.5* 32.5*  MCV 73.9* 71.0*  PLT 258 370   Basic Metabolic Panel: Recent Labs  Lab 09/10/19 0313 09/14/19 2328 09/15/19 1030 09/15/19 1522  NA 127* 128* 131* 132*  K 3.8 3.7 3.1* 3.3*  CL 89* 89* 98 99  CO2 26 25 25 22   GLUCOSE 594* 682* 227* 210*  BUN 9 7 6 6   CREATININE 1.00 0.91 0.68 0.71  CALCIUM 8.8* 8.6* 8.6* 8.2*   GFR: Estimated Creatinine Clearance: 118.3 mL/min (by C-G formula based on SCr of 0.71 mg/dL). Liver Function Tests: Recent Labs  Lab 09/10/19 0313 09/14/19 2328  AST 11* 14*  ALT 8 9  ALKPHOS 82 96  BILITOT 0.6 0.5  PROT 7.3 7.7  ALBUMIN 2.9* 3.0*   Recent Labs  Lab 09/10/19 0313 09/14/19 2328  LIPASE 12 14   No results for input(s): AMMONIA in the last 168 hours. Coagulation Profile: No results for input(s): INR, PROTIME in the last 168 hours. Cardiac Enzymes: No results for input(s): CKTOTAL, CKMB, CKMBINDEX, TROPONINI in the last 168 hours. BNP (last 3 results) No results for input(s): PROBNP in the last 8760 hours. HbA1C: No results for input(s): HGBA1C in the last 72 hours. CBG: Recent Labs  Lab 09/15/19 1001 09/15/19 1109 09/15/19 1238 09/15/19 1540 09/15/19 1833  GLUCAP 163* 152* 157* 207* 182*   Lipid Profile: No results for input(s): CHOL, HDL, LDLCALC, TRIG, CHOLHDL, LDLDIRECT in the last 72 hours. Thyroid Function Tests: No results for input(s): TSH, T4TOTAL, FREET4, T3FREE, THYROIDAB in the last 72 hours. Anemia Panel: No results for input(s): VITAMINB12, FOLATE, FERRITIN, TIBC, IRON, RETICCTPCT in the last 72 hours. Urine analysis:    Component Value Date/Time   COLORURINE YELLOW 09/14/2019 2309   APPEARANCEUR CLEAR 09/14/2019 2309   LABSPEC <1.005 (L) 09/14/2019 2309   PHURINE 5.5 09/14/2019 2309   GLUCOSEU >=500 (A) 09/14/2019 2309   HGBUR TRACE (A) 09/14/2019 2309   BILIRUBINUR NEGATIVE 09/14/2019 2309   KETONESUR NEGATIVE 09/14/2019 2309   PROTEINUR NEGATIVE  09/14/2019 2309   UROBILINOGEN 0.2 02/12/2015 1348   NITRITE NEGATIVE 09/14/2019 2309   LEUKOCYTESUR NEGATIVE 09/14/2019 2309   Sepsis Labs: @LABRCNTIP (procalcitonin:4,lacticidven:4) )No results found for this or any previous visit (from the past 240 hour(s)).   Radiological Exams on Admission: DG Abdomen Acute W/Chest  Result Date: 09/14/2019 CLINICAL DATA:  Left-sided abdominal pain x3 days. EXAM: DG ABDOMEN ACUTE W/ 1V CHEST COMPARISON:  August 10, 2018 FINDINGS: There  is no evidence of dilated bowel loops or free intraperitoneal air. A moderate amount of stool is seen. A radiopaque mesh like stent is seen overlying the left upper quadrant with a curvilinear radiopaque stent seen overlying the mid to upper right abdomen. No radiopaque calculi or other significant radiographic abnormality is seen. Heart size and mediastinal contours are within normal limits. A very mild amount of atelectasis is noted within the left lung base. IMPRESSION: 1. No acute cardiopulmonary disease. 2. Moderate stool burden without evidence of bowel obstruction. Electronically Signed   By: Aram Candela M.D.   On: 09/14/2019 23:49      Assessment/Plan Principal Problem:   Intractable abdominal pain Active Problems:   Hypokalemia   Schizophrenia (HCC)   Obesity, Class III, BMI 40-49.9 (morbid obesity) (HCC)   Necrotizing pancreatitis   Essential hypertension   Hyperosmolar hyperglycemic state (HHS) (HCC)     #1 abdominal pain: Intractable.  May be related to her necrotizing pancreatitis.  We will admit the patient and initiate pain management.  Supportive care.  GI to follow with the patient and advise.  #2 hypokalemia: Replete potassium.  #3 essential hypertension: Continue with home regimen.  #4 diabetes with hyperglycemia: Resume insulin with sliding scale.  #5 history of schizophrenia: Stable at this point.  Resume home regimen when feasible.  #6 morbid obesity: Stable.  Counseling  provided   DVT prophylaxis: Lovenox Code Status: Full code Family Communication: Son in the room Disposition Plan: Home Consults called: None none Dr. Meridee Score to be consulted in the morning Admission status: Inpatient  Severity of Illness: The appropriate patient status for this patient is INPATIENT. Inpatient status is judged to be reasonable and necessary in order to provide the required intensity of service to ensure the patient's safety. The patient's presenting symptoms, physical exam findings, and initial radiographic and laboratory data in the context of their chronic comorbidities is felt to place them at high risk for further clinical deterioration. Furthermore, it is not anticipated that the patient will be medically stable for discharge from the hospital within 2 midnights of admission. The following factors support the patient status of inpatient.   " The patient's presenting symptoms include abdominal pain. " The worrisome physical exam findings include epigastric tenderness. " The initial radiographic and laboratory data are worrisome because of CT recently showed pancreatitis. " The chronic co-morbidities include necrotizing pancreatitis and schizophrenia.   * I certify that at the point of admission it is my clinical judgment that the patient will require inpatient hospital care spanning beyond 2 midnights from the point of admission due to high intensity of service, high risk for further deterioration and high frequency of surveillance required.Lonia Blood MD [facility] Pager [phone]  If 7PM-7AM, please contact night-coverage [email or web address] Password [facility]  09/15/2019, 7:30 PM

## 2019-09-15 NOTE — ED Notes (Signed)
Insulin drip modified to 2.6 ml/hr per endo tool

## 2019-09-15 NOTE — ED Notes (Signed)
CBG 193. 

## 2019-09-15 NOTE — ED Provider Notes (Signed)
[facility] EMERGENCY DEPARTMENT Provider Note   CSN: [id number] Arrival date & time: 09/14/19  2217     History Chief Complaint  Patient presents with  . Chest Pain  . Abdominal Pain    Monique Holland is a 54 y.o. female.  HPI     This is a 54 year old female with history of diabetes, hypertension, recent complicated past medical history with necrotizing pancreatitis, pseudocyst with multiple procedures and drainage who presents with persistent abdominal pain.  She was seen and evaluated by myself on Monday.  At that time she was noted to be hyperglycemic.  She had a CT scan which showed some persistent but improved changes in the pancreatic region.  She was able to be pain controlled and was discharged home with GI follow-up.  Patient reports that she has had ongoing pain at home refractory to tramadol and oxycodone.  She rates her pain 8 out of 10.  It is sharp and epigastric in nature.  She is also had nonbilious, nonbloody emesis and "I cannot keep anything down."  Denies fevers or urinary symptoms.  Patient does report over the last 2 days she has developed intermittent chest pain and shortness of breath.  Denies exertional symptoms.  Denies fevers or cough or Covid symptoms.  Last bowel movement was Wednesday.  She is due to have a repeat EGD on Wednesday by Dr. Meridee Score.  In Brief:  Patient was admitted in February for necrotizing pancreatitis.  She subsequently was admitted in March for pancreatic pseudocyst.  On her most recent admission in late March, she had multiple procedures for said pancreatic pseudocyst.  Patient was discharged home on Augmentin for 21 days.    From discharge summary: 3/31: EGD/EUS with Dr. Meridee Score -acquired extrinsic deformity and posterior wall of stomach, erythematous mucosa of gastric body and antrum. 85 x 84 mm cyst walled off necrosis in peripancreatic region consistent with pancreatic pseudocyst and necrosis with 1 enlarged lymph  node. Further imaging ordered by GI for further evaluation to evaluate for potential pseudoaneurysm - no pseudoaneurysm on imaging  4/2: Repeat EUS - cystic lesion in peripancreatic region consistent with pseudocyst with walled off necrosis. Cystogastrostomy created. Started on Cipro  4/5S/P endoscopic necrosectomywithdebridement , and placement of 2 pigtail stents, medium amt of remaining necrotic tissueand also candida esophagitis. Started on Diflucan 400 mg x 1 day followed by 200 mg daily to complete 14 days. Cyst culture- multiple speciesoccluding MSSA, non aereboic -Augmentintwice daily x21 daysstarted  Past Medical History:  Diagnosis Date  . Anxiety   . Asthma   . Bipolar disorder (HCC)   . Depression   . Diabetes mellitus without complication (HCC)   . Frequency of urination   . GERD (gastroesophageal reflux disease)   . History of asthma    last episode yrs ago  . Hypertension   . OSA (obstructive sleep apnea)    pt had study done oct 2014--  schedule for cpap titrate after kidney stone surgery  . Pancreatitis   . Pseudocyst of pancreas   . Schizophrenia (HCC)   . Ureteral calculi    BILATERAL  . Wears glasses     Patient Active Problem List   Diagnosis Date Noted  . Non-intractable vomiting   . Hematemesis with nausea   . Abdominal pain 08/03/2019  . Essential hypertension 08/03/2019  . Pancreatic pseudocyst   . Nausea and vomiting in adult   . Necrotizing pancreatitis   . Gastritis and gastroduodenitis   . Candida  esophagitis (HCC)   . Acute pancreatitis 07/17/2019  . Schizophrenia (HCC) 09/02/2018  . Obesity, Class III, BMI 40-49.9 (morbid obesity) (HCC) 09/02/2018  . Iron deficiency 09/02/2018  . Symptomatic anemia 09/01/2018  . Hypokalemia 09/01/2018  . Severe sepsis (HCC) 08/11/2018  . AKI (acute kidney injury) (HCC) 08/11/2018  . Hypotension 08/11/2018  . Type 2 diabetes mellitus (HCC) 08/11/2018  . Acute appendicitis 05/22/2014  .  Ureteral calculus, right 05/02/2013    Past Surgical History:  Procedure Laterality Date  . BALLOON DILATION N/A 08/23/2019   Procedure: BALLOON DILATION;  Surgeon: Meridee Score Netty Starring., MD;  Location: [facility];  Service: Gastroenterology;  Laterality: N/A;  . BIOPSY  07/18/2019   Procedure: BIOPSY;  Surgeon: Shellia Cleverly, DO;  Location: [facility];  Service: Gastroenterology;;  . BIOPSY  08/23/2019   Procedure: BIOPSY;  Surgeon: Lemar Lofty., MD;  Location: [facility];  Service: Gastroenterology;;  . CESAREAN SECTION  1991  &  2002   w/ bilateral tubal ligation in 2002  . CYSTOSCOPY W/ URETERAL STENT PLACEMENT Right 08/10/2018   Procedure: CYSTOSCOPY WITH RETROGRADE PYELOGRAM/URETERAL STENT PLACEMENT;  Surgeon: Jerilee Field, MD;  Location: [facility];  Service: Urology;  Laterality: Right;  . CYSTOSCOPY WITH RETROGRADE PYELOGRAM, URETEROSCOPY AND STENT PLACEMENT Bilateral 05/02/2013   Procedure: CYSTOSCOPY WITH RETROGRADE PYELOGRAM, URETEROSCOPY ;  Surgeon: Valetta Fuller, MD;  [facility];  Service: Urology;  Laterality: Bilateral;  . CYSTOSCOPY WITH RETROGRADE PYELOGRAM, URETEROSCOPY AND STENT PLACEMENT Right 08/15/2018   Procedure: CYSTOSCOPY WITH RIGHT RETROGRADE PYELOGRAM, RIGHT URETEROSCOPY WITH HOLMIUM LASER AND STENT PLACEMENT;  Surgeon: Crista Elliot, MD;  Location: [facility];  Service: Urology;  Laterality: Right;  . CYSTOSCOPY WITH STENT PLACEMENT Left 05/02/2013   Procedure: CYSTOSCOPY WITH STENT PLACEMENT;  Surgeon: Valetta Fuller, MD;  Location: [facility];  Service: Urology;  Laterality: Left;  . ENDOROTOR N/A 08/26/2019   Procedure: [id number];  Surgeon: Mansouraty, Netty Starring., MD;  Location: [facility] ENDOSCOPY;  Service: Gastroenterology;  Laterality: N/A;  . ENDOROTOR  09/05/2019   Procedure: [id number];  Surgeon: Mansouraty, Netty Starring., MD;  Location: [facility] ENDOSCOPY;  Service: Gastroenterology;;  .  ESOPHAGOGASTRODUODENOSCOPY N/A 08/26/2019   Procedure: ESOPHAGOGASTRODUODENOSCOPY (EGD) with NECROSECTOMY;  Surgeon: Lemar Lofty., MD;  Location: [facility] ENDOSCOPY;  Service: Gastroenterology;  Laterality: N/A;  . ESOPHAGOGASTRODUODENOSCOPY (EGD) WITH PROPOFOL N/A 07/18/2019   Procedure: ESOPHAGOGASTRODUODENOSCOPY (EGD) WITH PROPOFOL;  Surgeon: Shellia Cleverly, DO;  Location: [facility];  Service: Gastroenterology;  Laterality: N/A;  . ESOPHAGOGASTRODUODENOSCOPY (EGD) WITH PROPOFOL N/A 08/18/2019   Procedure: ESOPHAGOGASTRODUODENOSCOPY (EGD) WITH PROPOFOL;  Surgeon: Hilarie Fredrickson, MD;  Location: [facility];  Service: Endoscopy;  Laterality: N/A;  . ESOPHAGOGASTRODUODENOSCOPY (EGD) WITH PROPOFOL N/A 08/21/2019   Procedure: ESOPHAGOGASTRODUODENOSCOPY (EGD) WITH PROPOFOL;  Surgeon: Meridee Score Netty Starring., MD;  Location: [facility];  Service: Gastroenterology;  Laterality: N/A;  . ESOPHAGOGASTRODUODENOSCOPY (EGD) WITH PROPOFOL N/A 08/23/2019   Procedure: ESOPHAGOGASTRODUODENOSCOPY (EGD) WITH PROPOFOL;  Surgeon: Meridee Score Netty Starring., MD;  Location: [facility];  Service: Gastroenterology;  Laterality: N/A;  . ESOPHAGOGASTRODUODENOSCOPY (EGD) WITH PROPOFOL N/A 09/05/2019   Procedure: ESOPHAGOGASTRODUODENOSCOPY (EGD) WITH PROPOFOL;  Surgeon: Meridee Score Netty Starring., MD;  Location: [facility] ENDOSCOPY;  Service: Gastroenterology;  Laterality: N/A;   With pancreatic pseudocyst necrosectomy via established cyst gastrostomy  . EUS  08/23/2019   Procedure: UPPER ENDOSCOPIC ULTRASOUND (EUS) LINEAR;  Surgeon: Lemar Lofty., MD;  Location: [facility];  Service: Gastroenterology;;  . FOREIGN BODY REMOVAL  09/05/2019  Procedure: FOREIGN BODY REMOVAL;  Surgeon: Meridee Score Netty Starring., MD;  Location: [facility] ENDOSCOPY;  Service: Gastroenterology;;  . HOLMIUM LASER APPLICATION Bilateral 05/02/2013   Procedure: HOLMIUM LASER APPLICATION;  Surgeon: Valetta Fuller, MD;  Location: [facility];   Service: Urology;  Laterality: Bilateral;  . LAPAROSCOPIC APPENDECTOMY N/A 05/22/2014   Procedure: APPENDECTOMY LAPAROSCOPIC;  Surgeon: Glenna Fellows, MD;  Location: [facility];  Service: General;  Laterality: N/A;  . PANCREATIC STENT PLACEMENT  08/23/2019   Procedure: PANCREATIC STENT PLACEMENT;  Surgeon: Lemar Lofty., MD;  Location: [facility];  Service: Gastroenterology;;  . PANCREATIC STENT PLACEMENT  09/05/2019   Procedure: PANCREATIC STENT PLACEMENT;  Surgeon: Lemar Lofty., MD;  Location: [facility] ENDOSCOPY;  Service: Gastroenterology;;  . Francine Graven REMOVAL  08/26/2019   Procedure: STENT REMOVAL;  Surgeon: Lemar Lofty., MD;  Location: [facility] ENDOSCOPY;  Service: Gastroenterology;;  . Francine Graven REMOVAL  09/05/2019   Procedure: STENT REMOVAL;  Surgeon: Lemar Lofty., MD;  Location: [facility] ENDOSCOPY;  Service: Gastroenterology;;  . TONSILLECTOMY  age 80  . UPPER ESOPHAGEAL ENDOSCOPIC ULTRASOUND (EUS) N/A 08/21/2019   Procedure: UPPER ESOPHAGEAL ENDOSCOPIC ULTRASOUND (EUS);  Surgeon: Lemar Lofty., MD;  Location: [facility];  Service: Gastroenterology;  Laterality: N/A;  For cyst gastrostomy     OB History   No obstetric history on file.     Family History  Family history unknown: Yes    Social History   Tobacco Use  . Smoking status: Never Smoker  . Smokeless tobacco: Never Used  Substance Use Topics  . Alcohol use: No  . Drug use: No    Home Medications Prior to Admission medications   Medication Sig Start Date End Date Taking? Authorizing Provider  acetaminophen (TYLENOL) 325 MG tablet Take 2 tablets (650 mg total) by mouth every 6 (six) hours as needed for mild pain (or Fever >/= 101). Patient not taking: Reported on 09/13/2019 08/19/19   Regalado, Jon Billings A, MD  albuterol (VENTOLIN HFA) 108 (90 Base) MCG/ACT inhaler Inhale 1-2 puffs into the lungs every 6 (six) hours as needed for wheezing or shortness of breath. 07/21/19   Regalado, Belkys A, MD   amitriptyline (ELAVIL) 75 MG tablet Take 75 mg by mouth at bedtime. 08/10/19   [provider]  amLODipine (NORVASC) 5 MG tablet Take 5 mg by mouth daily.    [provider]  atorvastatin (LIPITOR) 40 MG tablet Take 40 mg by mouth daily.    [provider]  ciprofloxacin (CIPRO) 500 MG tablet Take 1 tablet (500 mg total) by mouth 2 (two) times daily for 14 days. 09/05/19 09/19/19  Mansouraty, Netty Starring., MD  clonazePAM (KLONOPIN) 0.5 MG tablet Take 0.5 mg by mouth daily.  05/23/19   [provider]  lamoTRIgine (LAMICTAL) 200 MG tablet Take 200 mg by mouth daily. 06/06/19   [provider]  LANTUS SOLOSTAR 100 UNIT/ML Solostar Pen Inject 15 Units into the skin at bedtime. Patient taking differently: Inject 40 Units into the skin at bedtime.  07/21/19   Regalado, Belkys A, MD  LATUDA 60 MG TABS Take 1 tablet by mouth at bedtime. 08/10/19   [provider]  lisinopril-hydrochlorothiazide (ZESTORETIC) 20-12.5 MG tablet Take 1 tablet by mouth in the morning and at bedtime.     [provider]  losartan (COZAAR) 25 MG tablet Take 25 mg by mouth daily.    [provider]  metoprolol tartrate (LOPRESSOR) 25 MG tablet Take 1 tablet (25 mg total) by  mouth 2 (two) times daily. 08/19/19   Regalado, Belkys A, MD  ondansetron (ZOFRAN) 8 MG tablet Take 1 tablet (8 mg total) by mouth every 8 (eight) hours as needed for nausea or vomiting. 08/19/19   Regalado, Belkys A, MD  Oxycodone HCl 10 MG TABS Take 10 mg by mouth every 4 (four) hours as needed (pain).  08/12/19   [provider]  pantoprazole (PROTONIX) 40 MG tablet Take 40 mg by mouth 2 (two) times daily.    [provider]  PREMARIN vaginal cream Place 1 Applicatorful vaginally daily as needed (irritation).  08/10/19   [provider]  propranolol (INDERAL) 20 MG tablet Take 20 mg by mouth 2 (two) times daily.    [provider]  senna (SENOKOT) 8.6 MG TABS  tablet Take 1 tablet (8.6 mg total) by mouth 2 (two) times daily. 07/21/19   Regalado, Jon Billings A, MD  SYMBICORT 160-4.5 MCG/ACT inhaler Inhale 2 puffs into the lungs 2 (two) times daily as needed (SOB/wheezing).  04/26/19   [provider]  traZODone (DESYREL) 100 MG tablet Take 100 mg by mouth at bedtime.    [provider]  TRULICITY 4.5 MG/0.5ML SOPN Inject 4.5 mg into the skin every Wednesday.  07/29/19   [provider]    Allergies    Patient has no known allergies.  Review of Systems   Review of Systems  Constitutional: Negative for fever.  Respiratory: Positive for shortness of breath. Negative for cough.   Cardiovascular: Positive for chest pain. Negative for leg swelling.  Gastrointestinal: Positive for abdominal pain, nausea and vomiting. Negative for blood in stool, constipation and diarrhea.  Genitourinary: Negative for dysuria.  All other systems reviewed and are negative.   Physical Exam Updated Vital Signs BP (!) 164/96 (BP Location: Right Arm)   Pulse (!) 108   Temp 99 F (37.2 C) (Oral)   Resp 18   Ht 1.727 m (5\' 8" )   Wt 132 kg   LMP 03/23/2014 Comment: (-) u preg?/ac  SpO2 95%   BMI 44.25 kg/m   Physical Exam Vitals and nursing note reviewed.  Constitutional:      Appearance: She is well-developed. She is obese. She is not ill-appearing.  HENT:     Head: Normocephalic and atraumatic.     Nose: Nose normal.     Mouth/Throat:     Mouth: Mucous membranes are dry.  Eyes:     Pupils: Pupils are equal, round, and reactive to light.  Cardiovascular:     Rate and Rhythm: Normal rate and regular rhythm.     Heart sounds: Normal heart sounds.  Pulmonary:     Effort: Pulmonary effort is normal. No respiratory distress.     Breath sounds: No wheezing.  Abdominal:     General: Bowel sounds are normal.     Palpations: Abdomen is soft.     Tenderness: There is abdominal tenderness in the epigastric area. There is no guarding or rebound.   Musculoskeletal:     Cervical back: Neck supple.     Right lower leg: No edema.     Left lower leg: No edema.  Skin:    General: Skin is warm and dry.  Neurological:     Mental Status: She is alert and oriented to person, place, and time.  Psychiatric:        Mood and Affect: Mood normal.     ED Results / Procedures / Treatments   Labs (all labs ordered  are listed, but only abnormal results are displayed) Labs Reviewed  URINALYSIS, ROUTINE W REFLEX MICROSCOPIC - Abnormal; Notable for the following components:      Result Value   Specific Gravity, Urine <1.005 (*)    Glucose, UA >=500 (*)    Hgb urine dipstick TRACE (*)    All other components within normal limits  CBC WITH DIFFERENTIAL/PLATELET - Abnormal; Notable for the following components:   Hemoglobin 9.9 (*)    HCT 32.5 (*)    MCV 71.0 (*)    MCH 21.6 (*)    All other components within normal limits  COMPREHENSIVE METABOLIC PANEL - Abnormal; Notable for the following components:   Sodium 128 (*)    Chloride 89 (*)    Glucose, Bld 682 (*)    Calcium 8.6 (*)    Albumin 3.0 (*)    AST 14 (*)    All other components within normal limits  URINALYSIS, MICROSCOPIC (REFLEX) - Abnormal; Notable for the following components:   Bacteria, UA FEW (*)    All other components within normal limits  SARS CORONAVIRUS 2 (TAT 6-24 HRS)  LIPASE, BLOOD  CBG MONITORING, ED  TROPONIN I (HIGH SENSITIVITY)    EKG EKG Interpretation  Date/Time:  Saturday September 14 2019 22:19:42 EDT Ventricular Rate:  126 PR Interval:  148 QRS Duration: 88 QT Interval:  328 QTC Calculation: 475 R Axis:   -3 Text Interpretation: Sinus tachycardia Otherwise normal ECG since last tracing no significant change Confirmed by Malvin Johns [phone]) on 09/14/2019 10:43:39 PM   Radiology DG Abdomen Acute W/Chest  Result Date: 09/14/2019 CLINICAL DATA:  Left-sided abdominal pain x3 days. EXAM: DG ABDOMEN ACUTE W/ 1V CHEST COMPARISON:  August 10, 2018  FINDINGS: There is no evidence of dilated bowel loops or free intraperitoneal air. A moderate amount of stool is seen. A radiopaque mesh like stent is seen overlying the left upper quadrant with a curvilinear radiopaque stent seen overlying the mid to upper right abdomen. No radiopaque calculi or other significant radiographic abnormality is seen. Heart size and mediastinal contours are within normal limits. A very mild amount of atelectasis is noted within the left lung base. IMPRESSION: 1. No acute cardiopulmonary disease. 2. Moderate stool burden without evidence of bowel obstruction. Electronically Signed   By: Virgina Norfolk M.D.   On: 09/14/2019 23:49    Procedures Procedures (including critical care time)  CRITICAL CARE Performed by: Merryl Hacker   Total critical care time: 40 minutes  Critical care time was exclusive of separately billable procedures and treating other patients.  Critical care was necessary to treat or prevent imminent or life-threatening deterioration.  Critical care was time spent personally by me on the following activities: development of treatment plan with patient and/or surrogate as well as nursing, discussions with consultants, evaluation of patient's response to treatment, examination of patient, obtaining history from patient or surrogate, ordering and performing treatments and interventions, ordering and review of laboratory studies, ordering and review of radiographic studies, pulse oximetry and re-evaluation of patient's condition.   Medications Ordered in ED Medications  insulin regular, human (MYXREDLIN) 100 units/ 100 mL infusion (has no administration in time range)  0.9 %  sodium chloride infusion (has no administration in time range)  dextrose 5 %-0.45 % sodium chloride infusion (has no administration in time range)  dextrose 50 % solution 0-50 mL (has no administration in time range)  sodium chloride 0.9 % bolus 1,000 mL (1,000 mLs  Intravenous New Bag/Given  09/14/19 2352)  morphine 4 MG/ML injection 4 mg (4 mg Intravenous Given 09/14/19 2355)  ondansetron (ZOFRAN) injection 4 mg (4 mg Intravenous Given 09/14/19 2353)    ED Course  I have reviewed the triage vital signs and the nursing notes.  Pertinent labs & imaging results that were available during my care of the patient were reviewed by me and considered in my medical decision making (see chart for details).    MDM Rules/Calculators/A&P                       Patient presents with persistent abdominal pain and nausea and vomiting.  Was seen and evaluated 5 days ago for the same with a CT scan which was arguably improved.  She has followed up with her GI doctor but has been refractory to pain medications.  She is scheduled for a procedure on Wednesday.  She is tachycardic and hypertensive on initial evaluation.  She is afebrile.  She has tenderness of the epigastric region without rebound or guarding.  Patient was given pain and nausea medication as well as fluids.  She is notably hyperglycemic without evidence of DKA.  Patient also has some pseudohypo-natremia.  She was started on Endo tool given glucose of 682.  Lipase is normal as well as additional metabolic panel.  She has no significant leukocytosis.  EKG is nonischemic and just shows sinus tachycardia.  Initial troponin is 5.  I doubt ACS.  Acute abdominal series shows no evidence of pneumothorax or pneumonia.  No free air in the abdomen.  Given ongoing pain but relatively medical stability, will try to avoid repeat CT scan at this time.  She likely needs to proceed more urgently with her EGD and GI evaluation.  We will plan for admission to the hospitalist for fluids, pain control, and GI evaluation in the morning.  Covid testing pending.  Final Clinical Impression(s) / ED Diagnoses Final diagnoses:  Epigastric pain  History of pancreatitis  Hyperglycemia  Dehydration    Rx / DC Orders ED Discharge Orders     None       Shon BatonHorton, Ayline Dingus F, MD 09/15/19 [phone]

## 2019-09-15 NOTE — ED Notes (Signed)
CBG 228 

## 2019-09-15 NOTE — ED Notes (Signed)
Dr. Wilkie Aye and primary RN aware of blood sugar of 682.

## 2019-09-16 DIAGNOSIS — E876 Hypokalemia: Secondary | ICD-10-CM | POA: Diagnosis not present

## 2019-09-16 DIAGNOSIS — R1013 Epigastric pain: Secondary | ICD-10-CM | POA: Diagnosis not present

## 2019-09-16 DIAGNOSIS — R112 Nausea with vomiting, unspecified: Secondary | ICD-10-CM

## 2019-09-16 DIAGNOSIS — Z8719 Personal history of other diseases of the digestive system: Secondary | ICD-10-CM | POA: Diagnosis not present

## 2019-09-16 DIAGNOSIS — R109 Unspecified abdominal pain: Secondary | ICD-10-CM | POA: Diagnosis not present

## 2019-09-16 DIAGNOSIS — K863 Pseudocyst of pancreas: Principal | ICD-10-CM

## 2019-09-16 DIAGNOSIS — F209 Schizophrenia, unspecified: Secondary | ICD-10-CM

## 2019-09-16 DIAGNOSIS — I10 Essential (primary) hypertension: Secondary | ICD-10-CM | POA: Diagnosis not present

## 2019-09-16 LAB — CBC
HCT: 32.7 % — ABNORMAL LOW (ref 36.0–46.0)
Hemoglobin: 9.4 g/dL — ABNORMAL LOW (ref 12.0–15.0)
MCH: 21.5 pg — ABNORMAL LOW (ref 26.0–34.0)
MCHC: 28.7 g/dL — ABNORMAL LOW (ref 30.0–36.0)
MCV: 74.8 fL — ABNORMAL LOW (ref 80.0–100.0)
Platelets: 378 K/uL (ref 150–400)
RBC: 4.37 MIL/uL (ref 3.87–5.11)
RDW: 14.9 % (ref 11.5–15.5)
WBC: 7.2 K/uL (ref 4.0–10.5)
nRBC: 0.3 % — ABNORMAL HIGH (ref 0.0–0.2)

## 2019-09-16 LAB — COMPREHENSIVE METABOLIC PANEL WITH GFR
ALT: 9 U/L (ref 0–44)
AST: 18 U/L (ref 15–41)
Albumin: 2.4 g/dL — ABNORMAL LOW (ref 3.5–5.0)
Alkaline Phosphatase: 79 U/L (ref 38–126)
Anion gap: 9 (ref 5–15)
BUN: 7 mg/dL (ref 6–20)
CO2: 24 mmol/L (ref 22–32)
Calcium: 8.7 mg/dL — ABNORMAL LOW (ref 8.9–10.3)
Chloride: 101 mmol/L (ref 98–111)
Creatinine, Ser: 0.72 mg/dL (ref 0.44–1.00)
GFR calc Af Amer: >60 mL/min
GFR calc non Af Amer: >60 mL/min
Glucose, Bld: 141 mg/dL — ABNORMAL HIGH (ref 70–99)
Potassium: 3.7 mmol/L (ref 3.5–5.1)
Sodium: 134 mmol/L — ABNORMAL LOW (ref 135–145)
Total Bilirubin: 0.2 mg/dL — ABNORMAL LOW (ref 0.3–1.2)
Total Protein: 6.5 g/dL (ref 6.5–8.1)

## 2019-09-16 LAB — GLUCOSE, CAPILLARY
Glucose-Capillary: 143 mg/dL — ABNORMAL HIGH (ref 70–99)
Glucose-Capillary: 120 mg/dL — ABNORMAL HIGH (ref 70–99)
Glucose-Capillary: 167 mg/dL — ABNORMAL HIGH (ref 70–99)
Glucose-Capillary: 168 mg/dL — ABNORMAL HIGH (ref 70–99)
Glucose-Capillary: 298 mg/dL — ABNORMAL HIGH (ref 70–99)
Glucose-Capillary: 283 mg/dL — ABNORMAL HIGH (ref 70–99)

## 2019-09-16 MED ORDER — SENNA 8.6 MG PO TABS
1.0000 | ORAL_TABLET | Freq: Every day | ORAL | Status: DC
  Administered 2019-09-16 – 2019-09-19 (×4): 8.6 mg via ORAL
  Filled 2019-09-16 (×4): qty 1

## 2019-09-16 MED ORDER — INSULIN ASPART 100 UNIT/ML ~~LOC~~ SOLN
10.0000 [IU] | Freq: Once | SUBCUTANEOUS | Status: AC
  Administered 2019-09-16: 10 [IU] via SUBCUTANEOUS

## 2019-09-16 MED ORDER — INSULIN ASPART 100 UNIT/ML ~~LOC~~ SOLN
12.0000 [IU] | Freq: Once | SUBCUTANEOUS | Status: AC
  Administered 2019-09-17: 12 [IU] via SUBCUTANEOUS

## 2019-09-16 NOTE — Consult Note (Signed)
Referring Provider:  [facility]         Primary Care Physician:  Knox RoyaltyJones, Enrico, MD Primary Gastroenterologist:  Doristine LocksVito Cirigliano, MD              We were asked to see this patient for:      Pancreatitis with necrosis            ASSESSMENT /  PLAN   54 year old female with PMH significant for bipolar disorder, schizophrenia, diabetes, hypertension, morbid obesity, OSA, necrotizing pancreatitis  # Acute on chronic pancreatitis complicated by pseudocysts/ WON.  --She is s/p EUS with necrosectomy 08/26/19 and 09/05/19 --Scheduled for another necrosectomy 09/18/19 but came to keeps coming to ED with worsening pain so now admitted.  --Most recent CT scan 4/20 shows decompression of pseudocystswall thickening about the pseudocyst and increased inflammatory changes suggest secondary infection of the pseudocyst.  Gas within the pseudocyst felt likely to be from the drainage catheter.  Acute on chronic pancreatitis --Currently on Cipro, maintenance IVF. Pain is controlled at present.  --Will let Dr. Meridee ScoreMansouraty know that patient is now inpatient.   # Constipation --Not surprising with pain meds plus decreased PO intake.  --Asking for home senna. Refuses suppository. Will give senna now.     HPI:    Chief Complaint: Abdominal pain  Monique AdlerLisa Ann Holland is a 54 y.o. 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  Pancreatitis presumably secondary to hypercalcemia related to hyperparathyroidism.  She had a left parathyroidectomy in February.  Upon discharge she continued to have nausea and was [place] [facility] with persistent nausea as well as hematemesis.  We saw her in consultation during that admission.   07/17/2019 CT scan showed extensive large intrahepatic and peripancreatic fluid collections associated with edematous and necrotic pancreas.  Slight compression  of the splenic vein without definite SVT.Marland Kitchen.  Was extensive intraperitoneal and retroperitoneal inflammatory changes.  There was associated reactive thickening of both the small bowel and splenic flexure.  Symptoms improved within a few days, diet advanced.  She was discharged home with plans to follow-up with Dr. Barron Alvineirigliano  08/02/2019 to 08/05/19 -readmission After discharge patient returned to the emergency department twice with recurrent nausea, vomiting and epigastric pain.  Pancreatic findings stable on imaging.  Lipase and other labs were unremarkable . She returned to the ED and was admitted on 08/03/2019 due to ongoing symptoms.  We explained that all of her recent studies were unchanged.  While she did have a pancreatic fluid collection they did not seem to be pressing on her stomach.  MRCP that admission showed a 12.6 cm pseudocyst.  She was asking for cheeseburger and [place] fries.  We placed on a low-fat diet and recommended discharge home.  She was discharged 08/06/2019 with antibiotics for UTI.  She was to follow-up with Dr. Barron Alvineirigliano in the office.  08/16/2019 to 08/27/19-  readmission Readmitted with ongoing upper abdominal pain, nausea and vomiting have resolved.  She also complained of hematemesis,  dark brown diarrhea andand bright red blood per rectum.  She had reported having a [facility] [place] [facility], we apparently requested the report.  Inpatient EGD by Dr. Marina GoodellPerry 08/18/2019 was essentially normal . Suggestion of mild to moderate extrinsic compression in the region of the duodenal bulb from known pancreatic pseudocyst. No blood, bleeding, or mucosal lesions.  She was still not able to tolerate fluids.  Plan was for cyst gastrostomy which was attempted 08/21/2019.  Procedure not done as an arterial vessel seem to be involved with fluid collection.  CT angio for further evaluation.  There is no evidence of pseudoaneurysm or arterial/venous flow within the cyst.   On 08/23/2019 she underwent EUS by Dr. Meridee Score   A pancreatic pseudocyst was found.in the peripancreatic region with walled off necrosis. Cystgastrostomy created, double pigtails placed.  On 08/26/19 had repeat EUS with necrosectomy. Pre-existing Double pigtails placed across AXIOS.  Plan was for follow-up EUS with further necrosectomy on 09/18/2018   09/05/2019 outpatient repeat EUS with necrosectomy. A non-bleeding clean based gastric ulcer was seen. Duodenal extrinsic impression in D3.   09/10/2019 ED again with abdominal pain.  Glucose was in the five hundreds, labs otherwise normal.  Repeat CT scan showed decompression of the size of the pancreatic pseudocyst.  Wall thickening about the pseudocyst and increased inflammatory changes suggest secondary infection of the pseudocyst.  Gas within the pseudocyst felt likely to be from the drainage catheter.  Acute on chronic pancreatitis  09/15/2019 (yesterday) back to the ED with uncontrolled abdominal pain. The upper abdominal pain never got better since leaving ED a few days ago but it progressed and she started vomiting so came back to ED. CT scan not repeated. WBC normal. Hgb stable at 9.4.    Previous endoscopic evaluation:  EUS 09/05/19  - Pre-existing AXIOS cystgastrostomy stent with double pigtail stents present. Double pigtail stents removed. - Necrosectomy was performed. Double pigtail replaced through the AXIOS. - Non-bleeding gastric ulcer with a clean ulcer base (Forrest Class III). - No gross lesions in the duodenal bulb, in the first portion of the duodenum and in the second portion of the duodenum. - Duodenal extrinsic impression in D3.  EUS 08/23/19   A pancreatic pseudocyst was found.in the peripancreatic region with walled off necrosis. Cystgastrostomy created, double pigtails placed.  EGD 08/18/2019 -Normal except for suggestion of mild to moderate extrinsic compression in the region of the duodenal bulb  EGD 07/19/2019 -Candida  esophagitis -Z-line mildly irregular consistent with mild inflammation but no bleeding -Moderate gastritis    Past Medical History:  Diagnosis Date  . Anxiety   . Asthma   . Bipolar disorder (HCC)   . Depression   . Diabetes mellitus without complication (HCC)   . Frequency of urination   . GERD (gastroesophageal reflux disease)   . History of asthma    last episode yrs ago  . Hypertension   . OSA (obstructive sleep apnea)    pt had study done oct 2014--  schedule for cpap titrate after kidney stone surgery  . Pancreatitis   . Pseudocyst of pancreas   . Schizophrenia (HCC)   . Ureteral calculi    BILATERAL  . Wears glasses     Past Surgical History:  Procedure Laterality Date  . BALLOON DILATION N/A 08/23/2019   Procedure: BALLOON DILATION;  Surgeon: Meridee Score Netty Starring., MD;  Location: [facility];  Service: Gastroenterology;  Laterality: N/A;  . BIOPSY  07/18/2019   Procedure: BIOPSY;  Surgeon: Shellia Cleverly, DO;  Location: [facility];  Service: Gastroenterology;;  . BIOPSY  08/23/2019   Procedure: BIOPSY;  Surgeon: Lemar Lofty., MD;  Location: [facility];  Service: Gastroenterology;;  . CESAREAN SECTION  1991  &  2002   w/ bilateral tubal ligation in 2002  . CYSTOSCOPY W/ URETERAL STENT PLACEMENT Right 08/10/2018   Procedure: CYSTOSCOPY WITH RETROGRADE PYELOGRAM/URETERAL STENT PLACEMENT;  Surgeon:  Jerilee Field, MD;  Location: [facility];  Service: Urology;  Laterality: Right;  . CYSTOSCOPY WITH RETROGRADE PYELOGRAM, URETEROSCOPY AND STENT PLACEMENT Bilateral 05/02/2013   Procedure: CYSTOSCOPY WITH RETROGRADE PYELOGRAM, URETEROSCOPY ;  Surgeon: Valetta Fuller, MD;  Location: [facility];  Service: Urology;  Laterality: Bilateral;  . CYSTOSCOPY WITH RETROGRADE PYELOGRAM, URETEROSCOPY AND STENT PLACEMENT Right 08/15/2018   Procedure: CYSTOSCOPY WITH RIGHT RETROGRADE PYELOGRAM, RIGHT URETEROSCOPY WITH HOLMIUM LASER AND STENT PLACEMENT;   Surgeon: Crista Elliot, MD;  Location: [facility];  Service: Urology;  Laterality: Right;  . CYSTOSCOPY WITH STENT PLACEMENT Left 05/02/2013   Procedure: CYSTOSCOPY WITH STENT PLACEMENT;  Surgeon: Valetta Fuller, MD;  Location: [facility];  Service: Urology;  Laterality: Left;  . ENDOROTOR N/A 08/26/2019   Procedure: [id number];  Surgeon: Mansouraty, Netty Starring., MD;  Location[facility];  Service: Gastroenterology;  Laterality: N/A;  . ENDOROTOR  09/05/2019   Procedure: [id number];  Surgeon: Mansouraty, Netty Starring., MD;  Location: [facility];  Service: Gastroenterology;;  . ESOPHAGOGASTRODUODENOSCOPY N/A 08/26/2019   Procedure: ESOPHAGOGASTRODUODENOSCOPY (EGD) with NECROSECTOMY;  Surgeon: Lemar Lofty., MD;  Location: [facility]COPY;  Service: Gastroenterology;  Laterality: N/A;  . ESOPHAGOGASTRODUODENOSCOPY (EGD) WITH PROPOFOL N/A 07/18/2019   Procedure: ESOPHAGOGASTRODUODENOSCOPY (EGD) WITH PROPOFOL;  Surgeon: Shellia Cleverly, DO;  Location: [facility];  Service: Gastroenterology;  Laterality: N/A;  . ESOPHAGOGASTRODUODENOSCOPY (EGD) WITH PROPOFOL N/A 08/18/2019   Procedure: ESOPHAGOGASTRODUODENOSCOPY (EGD) WITH PROPOFOL;  Surgeon: Hilarie Fredrickson, MD;  Location: [facility];  Service: Endoscopy;  Laterality: N/A;  . ESOPHAGOGASTRODUODENOSCOPY (EGD) WITH PROPOFOL N/A 08/21/2019   Procedure: ESOPHAGOGASTRODUODENOSCOPY (EGD) WITH PROPOFOL;  Surgeon: Meridee Score Netty Starring., MD;  Location: [facility];  Service: Gastroenterology;  Laterality: N/A;  . ESOPHAGOGASTRODUODENOSCOPY (EGD) WITH PROPOFOL N/A 08/23/2019   Procedure: ESOPHAGOGASTRODUODENOSCOPY (EGD) WITH PROPOFOL;  Surgeon: Meridee Score Netty Starring., MD;  Location: [facility];  Service: Gastroenterology;  Laterality: N/A;  . ESOPHAGOGASTRODUODENOSCOPY (EGD) WITH PROPOFOL N/A 09/05/2019   Procedure: ESOPHAGOGASTRODUODENOSCOPY (EGD) WITH PROPOFOL;  Surgeon: Meridee Score Netty Starring., MD;  Location: [facility];  Service:  Gastroenterology;  Laterality: N/A;   With pancreatic pseudocyst necrosectomy via established cyst gastrostomy  . EUS  08/23/2019   Procedure: UPPER ENDOSCOPIC ULTRASOUND (EUS) LINEAR;  Surgeon: Lemar Lofty., MD;  Location: [facility];  Service: Gastroenterology;;  . FOREIGN BODY REMOVAL  09/05/2019   Procedure: FOREIGN BODY REMOVAL;  Surgeon: Lemar Lofty., MD;  Location: [facility]COPY;  Service: Gastroenterology;;  . HOLMIUM LASER APPLICATION Bilateral 05/02/2013   Procedure: HOLMIUM LASER APPLICATION;  Surgeon: Valetta Fuller, MD;  Location: [facility];  Service: Urology;  Laterality: Bilateral;  . LAPAROSCOPIC APPENDECTOMY N/A 05/22/2014   Procedure: APPENDECTOMY LAPAROSCOPIC;  Surgeon: Glenna Fellows, MD;  Location: [facility];  Service: General;  Laterality: N/A;  . PANCREATIC STENT PLACEMENT  08/23/2019   Procedure: PANCREATIC STENT PLACEMENT;  Surgeon: Lemar Lofty., MD;  Location: [facility];  Service: Gastroenterology;;  . PANCREATIC STENT PLACEMENT  09/05/2019   Procedure: PANCREATIC STENT PLACEMENT;  Surgeon: Lemar Lofty., MD;  Location: [facility];  Service: Gastroenterology;;  . Francine Graven REMOVAL  08/26/2019   Procedure: STENT REMOVAL;  Surgeon: Lemar Lofty., MD;  Location: [facility] ENDOSCOPY;  Service: Gastroenterology;;  . Francine Graven REMOVAL  09/05/2019   Procedure: STENT REMOVAL;  Surgeon: Lemar Lofty., MD;  Location: [facility] ENDOSCOPY;  Service: Gastroenterology;;  . TONSILLECTOMY  age 60  . UPPER ESOPHAGEAL ENDOSCOPIC ULTRASOUND (EUS) N/A 08/21/2019   Procedure: UPPER  ESOPHAGEAL ENDOSCOPIC ULTRASOUND (EUS);  Surgeon: Lemar Lofty., MD;  Location: [facility];  Service: Gastroenterology;  Laterality: N/A;  For cyst gastrostomy    Prior to Admission medications   Medication Sig Start Date End Date Taking? Authorizing Provider  albuterol (VENTOLIN HFA) 108 (90 Base) MCG/ACT inhaler Inhale 1-2 puffs into the  lungs every 6 (six) hours as needed for wheezing or shortness of breath. 07/21/19  Yes Regalado, Belkys A, MD  amitriptyline (ELAVIL) 75 MG tablet Take 75 mg by mouth at bedtime. 08/10/19  Yes [provider]  amLODipine (NORVASC) 5 MG tablet Take 5 mg by mouth daily.   Yes [provider]  atorvastatin (LIPITOR) 40 MG tablet Take 40 mg by mouth daily.   Yes [provider]  ciprofloxacin (CIPRO) 500 MG tablet Take 1 tablet (500 mg total) by mouth 2 (two) times daily for 14 days. 09/05/19 09/19/19 Yes Mansouraty, Netty Starring., MD  clonazePAM (KLONOPIN) 0.5 MG tablet Take 0.5 mg by mouth daily.  05/23/19  Yes [provider]  lamoTRIgine (LAMICTAL) 200 MG tablet Take 200 mg by mouth daily. 06/06/19  Yes [provider]  LANTUS SOLOSTAR 100 UNIT/ML Solostar Pen Inject 15 Units into the skin at bedtime. Patient taking differently: Inject 40 Units into the skin at bedtime.  07/21/19  Yes Regalado, Belkys A, MD  LATUDA 60 MG TABS Take 1 tablet by mouth at bedtime. 08/10/19  Yes [provider]  lisinopril-hydrochlorothiazide (ZESTORETIC) 20-12.5 MG tablet Take 1 tablet by mouth in the morning and at bedtime.    Yes [provider]  losartan (COZAAR) 25 MG tablet Take 25 mg by mouth daily.   Yes [provider]  ondansetron (ZOFRAN) 8 MG tablet Take 1 tablet (8 mg total) by mouth every 8 (eight) hours as needed for nausea or vomiting. 08/19/19  Yes Regalado, Belkys A, MD  Oxycodone HCl 10 MG TABS Take 10 mg by mouth every 4 (four) hours as needed (pain).  08/12/19  Yes [provider]  pantoprazole (PROTONIX) 40 MG tablet Take 40 mg by mouth 2 (two) times daily.   Yes [provider]  PREMARIN vaginal cream Place 1 Applicatorful vaginally daily as needed (irritation).  08/10/19  Yes [provider]  propranolol (INDERAL) 20 MG tablet Take 20 mg by mouth 2 (two) times daily.   Yes [provider]  traZODone  (DESYREL) 100 MG tablet Take 100 mg by mouth at bedtime.   Yes [provider]  TRULICITY 4.5 MG/0.5ML SOPN Inject 4.5 mg into the skin every Wednesday.  07/29/19  Yes [provider]  metoprolol tartrate (LOPRESSOR) 25 MG tablet Take 1 tablet (25 mg total) by mouth 2 (two) times daily. Patient not taking: Reported on 09/15/2019 08/19/19   Regalado, Jon Billings A, MD  senna (SENOKOT) 8.6 MG TABS tablet Take 1 tablet (8.6 mg total) by mouth 2 (two) times daily. Patient not taking: Reported on 09/15/2019 07/21/19   Alba Cory, MD    Current Facility-Administered Medications  Medication Dose Route Frequency Provider Last Rate Last Admin  . 0.9 %  sodium chloride infusion   Intravenous Continuous Rometta Emery, MD   Stopped at 09/15/19 0314  . albuterol (PROVENTIL) (2.5 MG/3ML) 0.083% nebulizer solution 3 mL  3 mL Inhalation Q6H PRN Earlie Lou L, MD      . amitriptyline (ELAVIL) tablet 75 mg  75 mg Oral QHS Rometta Emery, MD   75 mg at 09/15/19 2145  .  amLODipine (NORVASC) tablet 5 mg  5 mg Oral Daily Elwyn Reach, MD   5 mg at 09/16/19 0927  . atorvastatin (LIPITOR) tablet 40 mg  40 mg Oral Daily Elwyn Reach, MD   40 mg at 09/16/19 0927  . ciprofloxacin (CIPRO) tablet 500 mg  500 mg Oral BID Elwyn Reach, MD   500 mg at 09/16/19 0927  . clonazePAM (KLONOPIN) tablet 0.5 mg  0.5 mg Oral Daily Gala Romney L, MD   0.5 mg at 09/16/19 0927  . dextrose 5 %-0.45 % sodium chloride infusion   Intravenous Continuous Elwyn Reach, MD   Stopped at 09/15/19 2100  . dextrose 50 % solution 0-50 mL  0-50 mL Intravenous PRN Gala Romney L, MD      . enoxaparin (LOVENOX) injection 60 mg  60 mg Subcutaneous Q24H Gala Romney L, MD   60 mg at 09/15/19 2146  . feeding supplement (BOOST / RESOURCE BREEZE) liquid 1 Container  1 Container Oral TID BM Garba, Mohammad L, MD      . HYDROmorphone (DILAUDID) injection 0.5-1 mg  0.5-1 mg Intravenous Q2H PRN Garba,  Mohammad L, MD      . insulin aspart (novoLOG) injection 3-9 Units  3-9 Units Subcutaneous Q4H Elwyn Reach, MD   3 Units at 09/16/19 0405  . insulin detemir (LEVEMIR) injection 12 Units  12 Units Subcutaneous Q12H Elwyn Reach, MD   12 Units at 09/16/19 [phone]  . lactated ringers infusion   Intravenous Continuous Elwyn Reach, MD 125 mL/hr at 09/15/19 2058 New Bag at 09/15/19 2058  . lamoTRIgine (LAMICTAL) tablet 200 mg  200 mg Oral Daily Gala Romney L, MD   200 mg at 09/16/19 0928  . losartan (COZAAR) tablet 25 mg  25 mg Oral Daily Elwyn Reach, MD   25 mg at 09/16/19 0927  . Lurasidone HCl TABS 60 mg  1 tablet Oral QHS Elwyn Reach, MD   60 mg at 09/15/19 2145  . ondansetron (ZOFRAN) tablet 4 mg  4 mg Oral Q6H PRN Elwyn Reach, MD       Or  . ondansetron (ZOFRAN) injection 4 mg  4 mg Intravenous Q6H PRN Gala Romney L, MD      . oxyCODONE (Oxy IR/ROXICODONE) immediate release tablet 10 mg  10 mg Oral Q4H PRN Gala Romney L, MD      . pantoprazole (PROTONIX) EC tablet 40 mg  40 mg Oral BID Elwyn Reach, MD   40 mg at 09/16/19 0927  . propranolol (INDERAL) tablet 20 mg  20 mg Oral BID Elwyn Reach, MD   20 mg at 09/16/19 0927  . traZODone (DESYREL) tablet 100 mg  100 mg Oral QHS Gala Romney L, MD   100 mg at 09/15/19 2145    Allergies as of 09/14/2019  . (No Known Allergies)    Family History  Family history unknown: Yes    Social History   Socioeconomic History  . Marital status: Married    Spouse name: Not on file  . Number of children: Not on file  . Years of education: Not on file  . Highest education level: Not on file  Occupational History  . Not on file  Tobacco Use  . Smoking status: Never Smoker  . Smokeless tobacco: Never Used  Substance and Sexual Activity  . Alcohol use: No  . Drug use: No  . Sexual activity: Not on file  Other Topics Concern  .  Not on file  Social History Narrative  . Not on file   Social  Determinants of Health   Financial Resource Strain:   . Difficulty of Paying Living Expenses:   Food Insecurity:   . Worried About Programme researcher, broadcasting/film/video in the Last Year:   . Barista in the Last Year:   Transportation Needs:   . Freight forwarder (Medical):   Marland Kitchen Lack of Transportation (Non-Medical):   Physical Activity:   . Days of Exercise per Week:   . Minutes of Exercise per Session:   Stress:   . Feeling of Stress :   Social Connections:   . Frequency of Communication with Friends and Family:   . Frequency of Social Gatherings with Friends and Family:   . Attends Religious Services:   . Active Member of Clubs or Organizations:   . Attends Banker Meetings:   Marland Kitchen Marital Status:   Intimate Partner Violence:   . Fear of Current or Ex-Partner:   . Emotionally Abused:   Marland Kitchen Physically Abused:   . Sexually Abused:     Review of Systems: All systems reviewed and negative except where noted in HPI.  Physical Exam: Vital signs in last 24 hours: Temp:  [98.5 F (36.9 C)-99.4 F (37.4 C)] 99 F (37.2 C) (04/26 0615) Pulse Rate:  [91-126] 91 (04/26 0615) Resp:  [16-30] 17 (04/26 0615) BP: (118-161)/(77-99) 133/77 (04/26 0615) SpO2:  [95 %-100 %] 95 % (04/26 0615) Last BM Date: 09/12/19 General:   Awoken her from sleep, female in NAD Psych:  cooperative. Normal mood and affect. Eyes:  Pupils equal, sclera clear, no icterus.   Conjunctiva pink. Ears:  Normal auditory acuity. Nose:  No deformity, discharge,  or lesions. Neck:  Supple; no masses Lungs:  Clear throughout to auscultation.   No wheezes, crackles, or rhonchi.  Heart:  Regular rate and rhythm;  no lower extremity edema Abdomen:  Soft, non-distended, nontender, BS active, no palp mass   Rectal:  Deferred  Msk:  Symmetrical without gross deformities. . Neurologic:  Alert and  oriented x4;  grossly normal neurologically. Skin:  Intact without significant lesions or rashes.   Intake/Output  from previous day: 04/25 0701 - 04/26 0700 In: 2280.5 [I.V.:2280.5] Out: -  Intake/Output this shift: Total I/O In: -  Out: 300 [Urine:300]  Lab Results: Recent Labs    09/14/19 2328 09/15/19 1948 09/16/19 0515  WBC 6.7 8.0 7.2  HGB 9.9* 9.9* 9.4*  HCT 32.5* 34.0* 32.7*  PLT 370 346 378   BMET Recent Labs    09/15/19 1030 09/15/19 1030 09/15/19 1522 09/15/19 1948 09/16/19 0515  NA 131*  --  132*  --  134*  K 3.1*  --  3.3*  --  3.7  CL 98  --  99  --  101  CO2 25  --  22  --  24  GLUCOSE 227*  --  210*  --  141*  BUN 6  --  6  --  7  CREATININE 0.68   < > 0.71 0.73 0.72  CALCIUM 8.6*  --  8.2*  --  8.7*   < > = values in this interval not displayed.   LFT Recent Labs    09/16/19 0515  PROT 6.5  ALBUMIN 2.4*  AST 18  ALT 9  ALKPHOS 79  BILITOT 0.2*   PT/INR No results for input(s): LABPROT, INR in the last 72 hours. Hepatitis Panel No results  for input(s): HEPBSAG, HCVAB, HEPAIGM, HEPBIGM in the last 72 hours.   . CBC Latest Ref Rng & Units 09/16/2019 09/15/2019 09/14/2019  WBC 4.0 - 10.5 K/uL 7.2 8.0 6.7  Hemoglobin 12.0 - 15.0 g/dL 0.9(W) 1.1(B) 1.4(N)  Hematocrit 36.0 - 46.0 % 32.7(L) 34.0(L) 32.5(L)  Platelets 150 - 400 K/uL 378 346 370    . CMP Latest Ref Rng & Units 09/16/2019 09/15/2019 09/15/2019  Glucose 70 - 99 mg/dL 829(F) - 621(H)  BUN 6 - 20 mg/dL 7 - 6  Creatinine 0.86 - 1.00 mg/dL 5.78 4.69 6.29  Sodium 135 - 145 mmol/L 134(L) - 132(L)  Potassium 3.5 - 5.1 mmol/L 3.7 - 3.3(L)  Chloride 98 - 111 mmol/L 101 - 99  CO2 22 - 32 mmol/L 24 - 22  Calcium 8.9 - 10.3 mg/dL 5.2(W) - 8.2(L)  Total Protein 6.5 - 8.1 g/dL 6.5 - -  Total Bilirubin 0.3 - 1.2 mg/dL 4.1(L) - -  Alkaline Phos 38 - 126 U/L 79 - -  AST 15 - 41 U/L 18 - -  ALT 0 - 44 U/L 9 - -   Studies/Results: DG Abdomen Acute W/Chest  Result Date: 09/14/2019 CLINICAL DATA:  Left-sided abdominal pain x3 days. EXAM: DG ABDOMEN ACUTE W/ 1V CHEST COMPARISON:  August 10, 2018 FINDINGS:  There is no evidence of dilated bowel loops or free intraperitoneal air. A moderate amount of stool is seen. A radiopaque mesh like stent is seen overlying the left upper quadrant with a curvilinear radiopaque stent seen overlying the mid to upper right abdomen. No radiopaque calculi or other significant radiographic abnormality is seen. Heart size and mediastinal contours are within normal limits. A very mild amount of atelectasis is noted within the left lung base. IMPRESSION: 1. No acute cardiopulmonary disease. 2. Moderate stool burden without evidence of bowel obstruction. Electronically Signed   By: Aram Candela M.D.   On: 09/14/2019 23:49    Principal Problem:   Intractable abdominal pain Active Problems:   Hypokalemia   Schizophrenia (HCC)   Obesity, Class III, BMI 40-49.9 (morbid obesity) (HCC)   Necrotizing pancreatitis   Essential hypertension   Hyperosmolar hyperglycemic state (HHS) (HCC)    Willette Cluster, NP-C @  09/16/2019, 12:06 PM

## 2019-09-16 NOTE — Plan of Care (Signed)
  Problem: Coping: Goal: Level of anxiety will decrease Outcome: Progressing   Problem: Education: Goal: Knowledge of General Education information will improve Description: Including pain rating scale, medication(s)/side effects and non-pharmacologic comfort measures Outcome: Completed/Met   Problem: Activity: Goal: Risk for activity intolerance will decrease Outcome: Completed/Met

## 2019-09-16 NOTE — Progress Notes (Signed)
PROGRESS NOTE    Monique Holland  BCW:[id number] DOB: 1965-06-15 DOA: 09/14/2019 PCP: Knox Royalty, MD   Brief Narrative:  HPI per Earlie Lou on 09/15/19 Monique Holland is a 55 y.o. female with medical history significant of necrotizing pancreatitis, diabetes, hypertension, pancreatic pseudocyst, morbid obesity, bipolar disorder, obstructive sleep apnea who presented to the ER with abdominal pain rated as 8 out of 10.  Pain is constant and squeezing.  Not relieved by her home regimen.  Patient was also found to be hyperglycemic initiated on some insulin drip initially.  She has been on tramadol and oxycodone at home not controlling her pain.  CT scan shows persistent but improved changes in the pancreatic region.  Start on IV pain medicine and is being admitted for pain control.  Patient is scheduled to have a procedure by Dr. Meridee Score on April 28.  She denied any fever or chills no melena no bright red blood per rectum..  ED Course: Temperature 99.4 blood pressure 161/91 pulse 126 respiratory of 30 oxygen sat 96% on room air.  White count 8.0 hemoglobin 9.9 and platelet 346.  Sodium 132 potassium 3.3 chloride 99 CO2 22 glucose 210 creatinine 0.70.  Chest x-ray showed no acute cardiopulmonary disease.  CT abdomen pelvis on the 20 is as above.  Patient being admitted with abdominal pain for pain management  **Interim History  Diet has been in advanced from a clear liquid diet to full liquid diet and GI recommending not advancing past that.  GI recommending Cipro and they are recommending to keep the patient inpatient until her EUS with necrosectomy on 4/28.   Assessment & Plan:   Principal Problem:   Intractable abdominal pain Active Problems:   Hypokalemia   Schizophrenia (HCC)   Obesity, Class III, BMI 40-49.9 (morbid obesity) (HCC)   Necrotizing pancreatitis   Essential hypertension   Hyperosmolar hyperglycemic state (HHS) (HCC)   History of pancreatitis  Abdominal pain in the  setting of her acute on chronic pancreatitis complicated by pseudocyst -Was intractable but now improving  -Related to her necrotizing pancreatitis.   -We will admit the patient and initiate pain management.   -Cw Supportive care.   -GI to follow with the patient and advise. -They recommend continuing Cipro and maintenance IV fluids and we have changed to LR at 75 mL's per hour. -Continue pain control and patient is to remain inpatient for her procedure on 09/18/2026 -Advance diet has full liquid diet  Constipation -Bowel regimen  Hypokalemia -Improved as patient's potassium went from 3.1 is now 3.7  -Continue to monitor and replete as necessary -Repeat CMP in a.m.  Essential Hypertension -Continue with home regimen with amlodipine 5 mg p.o. daily, losartan 25 g p.o. daily, and propanolol 20 mg p.o. twice daily  Diabetes Mellitus Type 2 with hyperglycemia -Resume insulin with sliding scale. -Continue to monitor CBGs carefully; CBGs ranging from 120-168; blood sugar on CMP was 141 this a.m. -Continue to adjust insulin regimen  Hyponatremia -Mild and is improving -Patient's sodium went from 128 is now 134 -IV fluids have been adjusted now on LR -Continue monitor and trend repeat CMP in a.m.  History of Schizophrenia -Stable at this point.   -Resume home regimen with amitriptyline 75 mg p.o. nightly, clonazepam 0.5 mg p.o. daily, lamotrigine 200 mg p.o. daily, and lurasidone 60 mg p.o. nightly as well as trazodone 100 mg p.o. nightly  Hyperlipidemia -continue statin  GERD -Continue PPI with pantoprazole 40 mg p.o. twice daily  Morbid  Obesity -Estimated body mass index is 44.25 kg/m as calculated from the following:   Height as of this encounter: 5\' 8"  (1.727 m).   Weight as of this encounter: 132 kg. -Weight Loss and Dietary Counseling given   DVT prophylaxis: Enoxaparin  Code Status: FULL CODE  Family Communication: No family present at bedside  Disposition  Plan: Patient is to get an EUS and a necrosectomy on 4/28 and will need to remain inpatient; her diet will not be advanced past a full liquid diet  Consultants:   Gastroenterology    Procedures: None  Antimicrobials:  Anti-infectives (From admission, onward)   Start     Dose/Rate Route Frequency Ordered Stop   09/15/19 2100  ciprofloxacin (CIPRO) tablet 500 mg     500 mg Oral 2 times daily 09/15/19 1934 09/20/19 1959     Subjective: Seen and examined at bedside and she is wanting her diet advanced.  No nausea or vomiting.  States that her abdominal pain is little bit better but she has some constipation now.  No other concerns at this time denies any chest pain, lightheadedness or dizziness.  Objective: Vitals:   09/15/19 2155 09/16/19 0214 09/16/19 0615 09/16/19 1355  BP: (!) 148/99 118/83 133/77 94/64  Pulse: (!) 107 92 91 88  Resp: 16 16 17 17   Temp: 98.5 F (36.9 C) 99 F (37.2 C) 99 F (37.2 C) 98.8 F (37.1 C)  TempSrc: Oral Oral Oral Oral  SpO2: 98% 100% 95% 99%  Weight:      Height:        Intake/Output Summary (Last 24 hours) at 09/16/2019 2006 Last data filed at 09/16/2019 1235 Gross per 24 hour  Intake 1670.34 ml  Output 300 ml  Net 1370.34 ml   Filed Weights   09/14/19 2227  Weight: 132 kg   Examination: Physical Exam:  Constitutional: WN/WD morbidly obese Female in NAD and appears calm and comfortable Eyes: PERRL, lids and conjunctivae normal, sclerae anicteric  ENMT: External Ears, Nose appear normal. Grossly normal hearing. Mucous membranes are moist. Posterior pharynx clear of any exudate or lesions. Normal dentition.  Neck: Appears normal, supple, no cervical masses, normal ROM, no appreciable thyromegaly Respiratory: Diminished to auscultation bilaterally, no wheezing, rales, rhonchi or crackles. Normal respiratory effort and patient is not tachypenic. No accessory muscle use.  Cardiovascular: RRR, no murmurs / rubs / gallops. S1 and S2  auscultated.  Abdomen: Soft, mildly-tender, Distended 2/2 body habitus. Bowel sounds positive x4.  GU: Deferred. Musculoskeletal: No clubbing / cyanosis of digits/nails. No joint deformity upper and lower extremities. Skin: No rashes, lesions, ulcers on a limited skin evaluation. No induration; Warm and dry.  Neurologic: CN 2-12 grossly intact with no focal deficits.  Romberg sign cerebellar reflexes not assessed.  Psychiatric: Normal judgment and insight. Alert and oriented x 3. Normal mood and appropriate affect.   Data Reviewed: I have personally reviewed following labs and imaging studies  CBC: Recent Labs  Lab 09/10/19 0313 09/14/19 2328 09/15/19 1948 09/16/19 0515  WBC 6.2 6.7 8.0 7.2  NEUTROABS 4.1 3.9  --   --   HGB 9.7* 9.9* 9.9* 9.4*  HCT 32.5* 32.5* 34.0* 32.7*  MCV 73.9* 71.0* 74.6* 74.8*  PLT 258 370 346 696   Basic Metabolic Panel: Recent Labs  Lab 09/10/19 0313 09/10/19 0313 09/14/19 2328 09/15/19 1030 09/15/19 1522 09/15/19 1948 09/16/19 0515  NA 127*  --  128* 131* 132*  --  134*  K 3.8  --  3.7 3.1* 3.3*  --  3.7  CL 89*  --  89* 98 99  --  101  CO2 26  --  25 25 22   --  24  GLUCOSE 594*  --  682* 227* 210*  --  141*  BUN 9  --  7 6 6   --  7  CREATININE 1.00   < > 0.91 0.68 0.71 0.73 0.72  CALCIUM 8.8*  --  8.6* 8.6* 8.2*  --  8.7*   < > = values in this interval not displayed.   GFR: Estimated Creatinine Clearance: 118.3 mL/min (by C-G formula based on SCr of 0.72 mg/dL). Liver Function Tests: Recent Labs  Lab 09/10/19 0313 09/14/19 2328 09/16/19 0515  AST 11* 14* 18  ALT 8 9 9   ALKPHOS 82 96 79  BILITOT 0.6 0.5 0.2*  PROT 7.3 7.7 6.5  ALBUMIN 2.9* 3.0* 2.4*   Recent Labs  Lab 09/10/19 0313 09/14/19 2328  LIPASE 12 14   No results for input(s): AMMONIA in the last 168 hours. Coagulation Profile: No results for input(s): INR, PROTIME in the last 168 hours. Cardiac Enzymes: No results for input(s): CKTOTAL, CKMB, CKMBINDEX,  TROPONINI in the last 168 hours. BNP (last 3 results) No results for input(s): PROBNP in the last 8760 hours. HbA1C: No results for input(s): HGBA1C in the last 72 hours. CBG: Recent Labs  Lab 09/15/19 2342 09/16/19 0401 09/16/19 0731 09/16/19 1132 09/16/19 1600  GLUCAP 214* 143* 120* 167* 168*   Lipid Profile: No results for input(s): CHOL, HDL, LDLCALC, TRIG, CHOLHDL, LDLDIRECT in the last 72 hours. Thyroid Function Tests: No results for input(s): TSH, T4TOTAL, FREET4, T3FREE, THYROIDAB in the last 72 hours. Anemia Panel: No results for input(s): VITAMINB12, FOLATE, FERRITIN, TIBC, IRON, RETICCTPCT in the last 72 hours. Sepsis Labs: No results for input(s): PROCALCITON, LATICACIDVEN in the last 168 hours.  Recent Results (from the past 240 hour(s))  SARS CORONAVIRUS 2 (TAT 6-24 HRS) Nasopharyngeal Nasopharyngeal Swab     Status: None   Collection Time: 09/14/19 11:58 PM   Specimen: Nasopharyngeal Swab  Result Value Ref Range Status   SARS Coronavirus 2 NEGATIVE NEGATIVE Final    Comment: (NOTE) SARS-CoV-2 target nucleic acids are NOT DETECTED. The SARS-CoV-2 RNA is generally detectable in upper and lower respiratory specimens during the acute phase of infection. Negative results do not preclude SARS-CoV-2 infection, do not rule out co-infections with other pathogens, and should not be used as the sole basis for treatment or other patient management decisions. Negative results must be combined with clinical observations, patient history, and epidemiological information. The expected result is Negative. Fact Sheet for Patients: 09/18/19 Fact Sheet for Healthcare Providers: 09/18/19 This test is not yet approved or cleared by the 09/16/19 FDA and  has been authorized for detection and/or diagnosis of SARS-CoV-2 by FDA under an Emergency Use Authorization (EUA). This EUA will remain  in effect (meaning  this test can be used) for the duration of the COVID-19 declaration under Section 56 4(b)(1) of the Act, 21 U.S.C. section 360bbb-3(b)(1), unless the authorization is terminated or revoked sooner. Performed at [facility], [address] [place], [facility] [address]      RN Pressure Injury Documentation:     Estimated body mass index is 44.25 kg/m as calculated from the following:   Height as of this encounter: 5\' 8"  (1.727 m).   Weight as of this encounter: 132 kg.  Malnutrition Type:  Malnutrition Characteristics:      Nutrition Interventions:    Radiology Studies: DG Abdomen Acute W/Chest  Result Date: 09/14/2019 CLINICAL DATA:  Left-sided abdominal pain x3 days. EXAM: DG ABDOMEN ACUTE W/ 1V CHEST COMPARISON:  August 10, 2018 FINDINGS: There is no evidence of dilated bowel loops or free intraperitoneal air. A moderate amount of stool is seen. A radiopaque mesh like stent is seen overlying the left upper quadrant with a curvilinear radiopaque stent seen overlying the mid to upper right abdomen. No radiopaque calculi or other significant radiographic abnormality is seen. Heart size and mediastinal contours are within normal limits. A very mild amount of atelectasis is noted within the left lung base. IMPRESSION: 1. No acute cardiopulmonary disease. 2. Moderate stool burden without evidence of bowel obstruction. Electronically Signed   By: Aram Candela M.D.   On: 09/14/2019 23:49    Scheduled Meds: . amitriptyline  75 mg Oral QHS  . amLODipine  5 mg Oral Daily  . atorvastatin  40 mg Oral Daily  . ciprofloxacin  500 mg Oral BID  . clonazePAM  0.5 mg Oral Daily  . enoxaparin (LOVENOX) injection  60 mg Subcutaneous Q24H  . feeding supplement  1 Container Oral TID BM  . insulin aspart  3-9 Units Subcutaneous Q4H  . insulin detemir  12 Units Subcutaneous Q12H  . lamoTRIgine  200 mg Oral Daily  . losartan  25 mg Oral Daily  . Lurasidone HCl  1 tablet Oral QHS   . pantoprazole  40 mg Oral BID  . propranolol  20 mg Oral BID  . senna  1 tablet Oral Daily  . traZODone  100 mg Oral QHS   Continuous Infusions: . lactated ringers 75 mL/hr at 09/16/19 1235    LOS: 0 days   Merlene Laughter, DO [facility] PAGER is on AMION  If 7PM-7AM, please contact night-coverage [email or web address]

## 2019-09-16 NOTE — Care Management Obs Status (Signed)
MEDICARE OBSERVATION STATUS NOTIFICATION   Patient Details  Name: Analyn Matusek MRN: [id number] Date of Birth: [birth year]   Medicare Observation Status Notification Given:  Yes    MahabirOlegario Messier, RN 09/16/2019, 2:49 PM

## 2019-09-16 NOTE — Progress Notes (Signed)
Assumed care of pt at 2300. Agree with previous RN assessment. Will continue to monitor.

## 2019-09-16 NOTE — TOC Initial Note (Signed)
Transition of Care [facility]) - Initial/Assessment Note    Patient Details  Name: Monique Holland MRN: [id number] Date of Birth: [birth year]  [facility] of [facility]) CM/SW Contact:    Dessa Phi, RN Phone Number: 09/16/2019, 2:44 PM  Clinical Narrative: patient states she has no transport home,no one to call,no money-Nsg notified of possible [place] transport needs @ d/c.                  Expected Discharge Plan: Home/Self Care Barriers to Discharge: Continued Medical Work up   Patient Goals and CMS Choice Patient states their goals for this hospitalization and ongoing recovery are:: go home      Expected Discharge Plan and Services Expected Discharge Plan: Home/Self Care   Discharge Planning Services: CM Consult   Living arrangements for the past 2 months: Apartment                                      Prior Living Arrangements/Services Living arrangements for the past 2 months: Apartment Lives with:: Self Patient language and need for interpreter reviewed:: Yes Do you feel safe going back to the place where you live?: Yes      Need for Family Participation in Patient Care: No (Comment) Care giver support system in place?: Yes (comment) Current home services: DME(cane) Criminal Activity/Legal Involvement Pertinent to Current Situation/Hospitalization: No - Comment as needed  Activities of Daily Living Home Assistive Devices/Equipment: Eyeglasses, Cane (specify quad or straight) ADL Screening (condition at time of admission) Patient's cognitive ability adequate to safely complete daily activities?: Yes Is the patient deaf or have difficulty hearing?: No Does the patient have difficulty seeing, even when wearing glasses/contacts?: No Does the patient have difficulty concentrating, remembering, or making decisions?: No Patient able to express need for assistance with ADLs?: Yes Does the patient have difficulty dressing or bathing?: No Independently  performs ADLs?: Yes (appropriate for developmental age) Does the patient have difficulty walking or climbing stairs?: Yes Weakness of Legs: None Weakness of Arms/Hands: None  Permission Sought/Granted Permission sought to share information with : Case Manager Permission granted to share information with : Yes, Verbal Permission Granted  Share Information with NAME: Case manager     Permission granted to share info w Relationship: Leory Plowman spouse [phone]     Emotional Assessment Appearance:: Appears stated age Attitude/Demeanor/Rapport: Gracious Affect (typically observed): Accepting Orientation: : Oriented to  Time, Oriented to Place, Oriented to Situation Alcohol / Substance Use: Not Applicable Psych Involvement: No (comment)  Admission diagnosis:  Dehydration [E86.0] Epigastric pain [R10.13] History of pancreatitis [Z87.19] Hyperglycemia [R73.9] Intractable abdominal pain [R10.9] Hyperosmolar hyperglycemic state (HHS) ([place]) [E11.00, E11.65] Patient Active Problem List   Diagnosis Date Noted  . Intractable abdominal pain 09/15/2019  . Hyperosmolar hyperglycemic state (HHS) ([place]) 09/15/2019  . Non-intractable vomiting   . Hematemesis with nausea   . Abdominal pain 08/03/2019  . Essential hypertension 08/03/2019  . Pancreatic pseudocyst   . Nausea and vomiting in adult   . Necrotizing pancreatitis   . Gastritis and gastroduodenitis   . Candida esophagitis ([place])   . Acute pancreatitis 07/17/2019  . Schizophrenia ([place]) 09/02/2018  . Obesity, Class III, BMI 40-49.9 (morbid obesity) ([place]) 09/02/2018  . Iron deficiency 09/02/2018  . Symptomatic anemia 09/01/2018  . Hypokalemia 09/01/2018  . Severe sepsis ([place]) 08/11/2018  . AKI (acute kidney injury) ([place]) 08/11/2018  . Hypotension 08/11/2018  .  Type 2 diabetes mellitus (HCC) 08/11/2018  . Acute appendicitis 05/22/2014  . Ureteral calculus, right 05/02/2013   PCP:  Knox Royalty, MD Pharmacy:   [id number] [place], [facility] - [phone] [place] [address] Phone: [phone] Fax: [phone]     Social Determinants of Health (SDOH) Interventions    Readmission Risk Interventions Readmission Risk Prevention Plan 08/20/2019 09/04/2018  Transportation Screening Complete Complete  PCP or Specialist Appt within 5-7 Days - Complete  Home Care Screening - Complete  Medication Review (RN CM) - Complete  Medication Review (RN Care Manager) Complete -  PCP or Specialist appointment within 3-5 days of discharge Complete -  SW Recovery Care/Counseling Consult Complete -  Palliative Care Screening Not Applicable -  Skilled Nursing Facility Not Applicable -  Some recent data might be hidden

## 2019-09-16 NOTE — Progress Notes (Signed)
Patient c/o 8/10 cramping pain in stomach and nausea.  Dilaudid and zofran given.  Pt states she ate half of a bologna sandwich that her son brought her.  Reinforced to patient that she is on a full liquid diet and is not supposed to eat anything other than that.  Pt states she understands.

## 2019-09-17 DIAGNOSIS — E876 Hypokalemia: Secondary | ICD-10-CM | POA: Diagnosis present

## 2019-09-17 DIAGNOSIS — E785 Hyperlipidemia, unspecified: Secondary | ICD-10-CM | POA: Diagnosis present

## 2019-09-17 DIAGNOSIS — Z20822 Contact with and (suspected) exposure to covid-19: Secondary | ICD-10-CM | POA: Diagnosis present

## 2019-09-17 DIAGNOSIS — K8689 Other specified diseases of pancreas: Secondary | ICD-10-CM | POA: Diagnosis not present

## 2019-09-17 DIAGNOSIS — K219 Gastro-esophageal reflux disease without esophagitis: Secondary | ICD-10-CM | POA: Diagnosis present

## 2019-09-17 DIAGNOSIS — R109 Unspecified abdominal pain: Secondary | ICD-10-CM | POA: Diagnosis not present

## 2019-09-17 DIAGNOSIS — B3781 Candidal esophagitis: Secondary | ICD-10-CM | POA: Diagnosis present

## 2019-09-17 DIAGNOSIS — E1100 Type 2 diabetes mellitus with hyperosmolarity without nonketotic hyperglycemic-hyperosmolar coma (NKHHC): Secondary | ICD-10-CM | POA: Diagnosis not present

## 2019-09-17 DIAGNOSIS — K863 Pseudocyst of pancreas: Secondary | ICD-10-CM | POA: Diagnosis present

## 2019-09-17 DIAGNOSIS — I10 Essential (primary) hypertension: Secondary | ICD-10-CM | POA: Diagnosis present

## 2019-09-17 DIAGNOSIS — Z9851 Tubal ligation status: Secondary | ICD-10-CM | POA: Diagnosis not present

## 2019-09-17 DIAGNOSIS — K8591 Acute pancreatitis with uninfected necrosis, unspecified: Secondary | ICD-10-CM | POA: Diagnosis present

## 2019-09-17 DIAGNOSIS — G4733 Obstructive sleep apnea (adult) (pediatric): Secondary | ICD-10-CM | POA: Diagnosis present

## 2019-09-17 DIAGNOSIS — Z8719 Personal history of other diseases of the digestive system: Secondary | ICD-10-CM

## 2019-09-17 DIAGNOSIS — K861 Other chronic pancreatitis: Secondary | ICD-10-CM | POA: Diagnosis present

## 2019-09-17 DIAGNOSIS — K5900 Constipation, unspecified: Secondary | ICD-10-CM | POA: Diagnosis not present

## 2019-09-17 DIAGNOSIS — E1165 Type 2 diabetes mellitus with hyperglycemia: Secondary | ICD-10-CM | POA: Diagnosis present

## 2019-09-17 DIAGNOSIS — Z9049 Acquired absence of other specified parts of digestive tract: Secondary | ICD-10-CM | POA: Diagnosis not present

## 2019-09-17 DIAGNOSIS — Z9089 Acquired absence of other organs: Secondary | ICD-10-CM | POA: Diagnosis not present

## 2019-09-17 DIAGNOSIS — F209 Schizophrenia, unspecified: Secondary | ICD-10-CM | POA: Diagnosis present

## 2019-09-17 DIAGNOSIS — R1013 Epigastric pain: Secondary | ICD-10-CM | POA: Diagnosis present

## 2019-09-17 DIAGNOSIS — J45909 Unspecified asthma, uncomplicated: Secondary | ICD-10-CM | POA: Diagnosis present

## 2019-09-17 DIAGNOSIS — Z6841 Body Mass Index (BMI) 40.0 and over, adult: Secondary | ICD-10-CM | POA: Diagnosis not present

## 2019-09-17 DIAGNOSIS — Z8619 Personal history of other infectious and parasitic diseases: Secondary | ICD-10-CM | POA: Diagnosis not present

## 2019-09-17 DIAGNOSIS — K228 Other specified diseases of esophagus: Secondary | ICD-10-CM | POA: Diagnosis not present

## 2019-09-17 DIAGNOSIS — F319 Bipolar disorder, unspecified: Secondary | ICD-10-CM | POA: Diagnosis present

## 2019-09-17 DIAGNOSIS — F419 Anxiety disorder, unspecified: Secondary | ICD-10-CM | POA: Diagnosis present

## 2019-09-17 DIAGNOSIS — Z87442 Personal history of urinary calculi: Secondary | ICD-10-CM | POA: Diagnosis not present

## 2019-09-17 LAB — GLUCOSE, CAPILLARY
Glucose-Capillary: 147 mg/dL — ABNORMAL HIGH (ref 70–99)
Glucose-Capillary: 172 mg/dL — ABNORMAL HIGH (ref 70–99)
Glucose-Capillary: 137 mg/dL — ABNORMAL HIGH (ref 70–99)
Glucose-Capillary: 290 mg/dL — ABNORMAL HIGH (ref 70–99)
Glucose-Capillary: 259 mg/dL — ABNORMAL HIGH (ref 70–99)

## 2019-09-17 LAB — CBC WITH DIFFERENTIAL/PLATELET
Abs Immature Granulocytes: 0.09 10*3/uL — ABNORMAL HIGH (ref 0.00–0.07)
Basophils Absolute: 0 10*3/uL (ref 0.0–0.1)
Basophils Relative: 0 %
Eosinophils Absolute: 0.1 10*3/uL (ref 0.0–0.5)
Eosinophils Relative: 1 %
HCT: 31.3 % — ABNORMAL LOW (ref 36.0–46.0)
Hemoglobin: 9 g/dL — ABNORMAL LOW (ref 12.0–15.0)
Immature Granulocytes: 1 %
Lymphocytes Relative: 28 %
Lymphs Abs: 1.9 10*3/uL (ref 0.7–4.0)
MCH: 21.3 pg — ABNORMAL LOW (ref 26.0–34.0)
MCHC: 28.8 g/dL — ABNORMAL LOW (ref 30.0–36.0)
MCV: 74 fL — ABNORMAL LOW (ref 80.0–100.0)
Monocytes Absolute: 1.2 10*3/uL — ABNORMAL HIGH (ref 0.1–1.0)
Monocytes Relative: 18 %
Neutro Abs: 3.5 10*3/uL (ref 1.7–7.7)
Neutrophils Relative %: 52 %
Platelets: 313 10*3/uL (ref 150–400)
RBC: 4.23 MIL/uL (ref 3.87–5.11)
RDW: 14.9 % (ref 11.5–15.5)
WBC: 6.8 10*3/uL (ref 4.0–10.5)
nRBC: 0 % (ref 0.0–0.2)

## 2019-09-17 LAB — COMPREHENSIVE METABOLIC PANEL
ALT: 11 U/L (ref 0–44)
AST: 21 U/L (ref 15–41)
Albumin: 2.2 g/dL — ABNORMAL LOW (ref 3.5–5.0)
Alkaline Phosphatase: 77 U/L (ref 38–126)
Anion gap: 9 (ref 5–15)
BUN: 6 mg/dL (ref 6–20)
CO2: 24 mmol/L (ref 22–32)
Calcium: 8.4 mg/dL — ABNORMAL LOW (ref 8.9–10.3)
Chloride: 100 mmol/L (ref 98–111)
Creatinine, Ser: 0.75 mg/dL (ref 0.44–1.00)
GFR calc Af Amer: >60 mL/min (ref >60)
GFR calc non Af Amer: >60 mL/min (ref >60)
Glucose, Bld: 143 mg/dL — ABNORMAL HIGH (ref 70–99)
Potassium: 3.7 mmol/L (ref 3.5–5.1)
Sodium: 133 mmol/L — ABNORMAL LOW (ref 135–145)
Total Bilirubin: 0.3 mg/dL (ref 0.3–1.2)
Total Protein: 6.5 g/dL (ref 6.5–8.1)

## 2019-09-17 LAB — HEMOGLOBIN A1C
Hgb A1c MFr Bld: 13 % — ABNORMAL HIGH (ref 4.8–5.6)
Mean Plasma Glucose: 326.4 mg/dL

## 2019-09-17 LAB — PHOSPHORUS: Phosphorus: 3.7 mg/dL (ref 2.5–4.6)

## 2019-09-17 LAB — MAGNESIUM: Magnesium: 1.6 mg/dL — ABNORMAL LOW (ref 1.7–2.4)

## 2019-09-17 MED ORDER — SODIUM CHLORIDE 0.9 % IV SOLN: INTRAVENOUS | Status: DC

## 2019-09-17 MED ORDER — PANTOPRAZOLE SODIUM 40 MG IV SOLR
40.0000 mg | Freq: Two times a day (BID) | INTRAVENOUS | Status: DC
  Administered 2019-09-17 – 2019-09-19 (×4): 40 mg via INTRAVENOUS
  Filled 2019-09-17 (×4): qty 40

## 2019-09-17 MED ORDER — MAGNESIUM SULFATE 2 GM/50ML IV SOLN
2.0000 g | Freq: Once | INTRAVENOUS | Status: AC
  Administered 2019-09-17: 09:00:00 2 g via INTRAVENOUS
  Filled 2019-09-17: qty 50

## 2019-09-17 MED ORDER — INSULIN ASPART 100 UNIT/ML ~~LOC~~ SOLN
4.0000 [IU] | Freq: Three times a day (TID) | SUBCUTANEOUS | Status: DC
  Administered 2019-09-17 – 2019-09-19 (×5): 4 [IU] via SUBCUTANEOUS

## 2019-09-17 MED ORDER — ALUM & MAG HYDROXIDE-SIMETH 200-200-20 MG/5ML PO SUSP
30.0000 mL | Freq: Four times a day (QID) | ORAL | Status: DC | PRN
  Administered 2019-09-17 (×2): 30 mL via ORAL
  Filled 2019-09-17 (×2): qty 30

## 2019-09-17 MED ORDER — PROCHLORPERAZINE EDISYLATE 10 MG/2ML IJ SOLN: 10.0000 mg | Freq: Four times a day (QID) | INTRAMUSCULAR | Status: DC | PRN

## 2019-09-17 MED ORDER — POLYETHYLENE GLYCOL 3350 17 G PO PACK
17.0000 g | PACK | Freq: Two times a day (BID) | ORAL | Status: DC
  Administered 2019-09-17 – 2019-09-19 (×5): 17 g via ORAL
  Filled 2019-09-17 (×5): qty 1

## 2019-09-17 MED ORDER — INSULIN ASPART 100 UNIT/ML ~~LOC~~ SOLN
0.0000 [IU] | Freq: Three times a day (TID) | SUBCUTANEOUS | Status: DC
  Administered 2019-09-17 – 2019-09-18 (×2): 11 [IU] via SUBCUTANEOUS
  Administered 2019-09-18: 15 [IU] via SUBCUTANEOUS
  Administered 2019-09-19: 7 [IU] via SUBCUTANEOUS
  Administered 2019-09-19: 11 [IU] via SUBCUTANEOUS

## 2019-09-17 MED ORDER — BISACODYL 10 MG RE SUPP: 10.0000 mg | Freq: Every day | RECTAL | Status: DC | PRN

## 2019-09-17 MED ORDER — INSULIN ASPART 100 UNIT/ML ~~LOC~~ SOLN
0.0000 [IU] | Freq: Every day | SUBCUTANEOUS | Status: DC
  Administered 2019-09-17 – 2019-09-18 (×2): 3 [IU] via SUBCUTANEOUS

## 2019-09-17 NOTE — Progress Notes (Signed)
     Progress Note  CC:   Pancreatitis       ASSESSMENT AND PLAN:   54 year old female with PMH significant for bipolar disorder, schizophrenia, diabetes, hypertension, morbid obesity, OSA, necrotizing pancreatitis  # Acute on chronic pancreatitis complicated by pseudocysts/ WON.  --She is s/p EUS with necrosectomy 08/26/19 and 09/05/19 --Scheduled for another necrosectomy 09/18/19. Was to be done here at [facility] anyway so can still get done.  --Most recent CT scan 4/20 shows acute on chronic pancreatitis, decompression of pseudocystswall thickening about the pseudocyst and increased inflammatory changes suggest secondary infection of the pseudocyst.  Gas within the pseudocyst felt likely to be from the drainage catheter.   --Continue Cipro --NPO after MN   SUBJECTIVE   Says she feels okay. No abdominal pain right now. Complains of being hungry    OBJECTIVE:     Vital signs in last 24 hours: Temp:  [98.8 F (37.1 C)-99.4 F (37.4 C)] 99.4 F (37.4 C) (04/27 0521) Pulse Rate:  [88-98] 90 (04/27 0521) Resp:  [17-20] 20 (04/27 0521) BP: (94-118)/(64-76) 118/76 (04/27 0521) SpO2:  [98 %-100 %] 100 % (04/27 0521) Last BM Date: 09/12/19 General:   Alert, NAD Heart:  Regular rate and rhythm;  No lower extremity edema   Pulm: Normal respiratory effort   Abdomen:  Soft,  nontender, nondistended.  Normal bowel sounds.          Neurologic:  Alert and  oriented,  grossly normal neurologically. Psych:   cooperative.  Normal mood and affect.   Intake/Output from previous day: 04/26 0701 - 04/27 0700 In: 1123.4 [I.V.:1123.4] Out: 300 [Urine:300] Intake/Output this shift: No intake/output data recorded.  Lab Results: Recent Labs    09/15/19 1948 09/16/19 0515 09/17/19 0515  WBC 8.0 7.2 6.8  HGB 9.9* 9.4* 9.0*  HCT 34.0* 32.7* 31.3*  PLT 346 378 313   BMET Recent Labs    09/15/19 1522 09/15/19 1522 09/15/19 1948 09/16/19 0515 09/17/19 0515  NA 132*  --   --  134*  133*  K 3.3*  --   --  3.7 3.7  CL 99  --   --  101 100  CO2 22  --   --  24 24  GLUCOSE 210*  --   --  141* 143*  BUN 6  --   --  7 6  CREATININE 0.71   < > 0.73 0.72 0.75  CALCIUM 8.2*  --   --  8.7* 8.4*   < > = values in this interval not displayed.   LFT Recent Labs    09/17/19 0515  PROT 6.5  ALBUMIN 2.2*  AST 21  ALT 11  ALKPHOS 77  BILITOT 0.3      Principal Problem:   Intractable abdominal pain Active Problems:   Hypokalemia   Schizophrenia (HCC)   Obesity, Class III, BMI 40-49.9 (morbid obesity) (HCC)   Necrotizing pancreatitis   Essential hypertension   Hyperosmolar hyperglycemic state (HHS) (HCC)   History of pancreatitis     LOS: 0 days   Willette Cluster ,NP 09/17/2019, 10:19 AM

## 2019-09-17 NOTE — Anesthesia Preprocedure Evaluation (Addendum)
Anesthesia Evaluation  Patient identified by MRN, date of birth, ID band Patient awake    Reviewed: Allergy & Precautions, NPO status , Patient's Chart, lab work & pertinent test results, reviewed documented beta blocker date and time   Airway Mallampati: III  TM Distance: >3 FB Neck ROM: Full    Dental  (+) Dental Advisory Given, Loose, Poor Dentition,    Pulmonary asthma , sleep apnea ,  Non-compliant with cpap   breath sounds clear to auscultation       Cardiovascular hypertension, Pt. on medications and Pt. on home beta blockers  Rhythm:Regular Rate:Normal     Neuro/Psych PSYCHIATRIC DISORDERS Anxiety Depression Bipolar Disorder Schizophrenia negative neurological ROS     GI/Hepatic Neg liver ROS, GERD  ,Necrotic pancreatic pseudocyst   Endo/Other  diabetes, Type 2, Insulin DependentMorbid obesityPancreatic necrosis BMI 45 Took 1/2 normal dose of insulin last night, no insulin this AM  Renal/GU negative Renal ROS  negative genitourinary   Musculoskeletal negative musculoskeletal ROS (+)   Abdominal (+) + obese,   Peds  Hematology  (+) Blood dyscrasia, anemia , hct 35.5   Anesthesia Other Findings   Reproductive/Obstetrics negative OB ROS                             Anesthesia Physical  Anesthesia Plan  ASA: III  Anesthesia Plan: General   Post-op Pain Management:    Induction: Intravenous and Rapid sequence  PONV Risk Score and Plan: 3 and Ondansetron, Dexamethasone, Midazolam and Treatment may vary due to age or medical condition  Airway Management Planned: Oral ETT  Additional Equipment: None  Intra-op Plan:   Post-operative Plan: Extubation in OR  Informed Consent: I have reviewed the patients History and Physical, chart, labs and discussed the procedure including the risks, benefits and alternatives for the proposed anesthesia with the patient or authorized  representative who has indicated his/her understanding and acceptance.     Dental advisory given  Plan Discussed with: CRNA and Anesthesiologist  Anesthesia Plan Comments: (  )                                        Anesthesia Evaluation  Patient identified by MRN, date of birth, ID band Patient awake    Reviewed: Allergy & Precautions, NPO status , Patient's Chart, lab work & pertinent test results, reviewed documented beta blocker date and time   Airway Mallampati: III  TM Distance: >3 FB Neck ROM: Full    Dental  (+) Dental Advisory Given, Loose, Poor Dentition,    Pulmonary asthma , sleep apnea ,  Non-compliant with cpap   breath sounds clear to auscultation       Cardiovascular hypertension, Pt. on medications and Pt. on home beta blockers  Rhythm:Regular Rate:Normal     Neuro/Psych PSYCHIATRIC DISORDERS Anxiety Depression Bipolar Disorder Schizophrenia negative neurological ROS     GI/Hepatic Neg liver ROS, GERD  ,Necrotic pancreatic pseudocyst   Endo/Other  diabetes, Type 2, Insulin DependentMorbid obesityPancreatic necrosis BMI 45 Took 1/2 normal dose of insulin last night, no insulin this AM  Renal/GU negative Renal ROS  negative genitourinary   Musculoskeletal negative musculoskeletal ROS (+)   Abdominal (+) + obese,   Peds  Hematology  (+) Blood dyscrasia, anemia , hct 35.5   Anesthesia Other Findings   Reproductive/Obstetrics negative OB ROS  Anesthesia Physical Anesthesia Plan  ASA: III  Anesthesia Plan: General   Post-op Pain Management:    Induction: Intravenous and Rapid sequence  PONV Risk Score and Plan: 3 and Ondansetron, Dexamethasone, Midazolam and Treatment may vary due to age or medical condition  Airway Management Planned: Oral ETT and Video Laryngoscope Planned  Additional Equipment: None  Intra-op Plan:   Post-operative Plan: Extubation in  OR  Informed Consent: I have reviewed the patients History and Physical, chart, labs and discussed the procedure including the risks, benefits and alternatives for the proposed anesthesia with the patient or authorized representative who has indicated his/her understanding and acceptance.     Dental advisory given  Plan Discussed with: CRNA  Anesthesia Plan Comments:        Anesthesia Quick Evaluation  Anesthesia Quick Evaluation

## 2019-09-17 NOTE — Progress Notes (Signed)
PROGRESS NOTE    Monique Holland  WCH:[id number] DOB: 09-26-1965 DOA: 09/14/2019 PCP: Knox Royalty, MD   Brief Narrative:  HPI per Earlie Lou on 09/15/19 Monique Holland is a 54 y.o. female with medical history significant of necrotizing pancreatitis, diabetes, hypertension, pancreatic pseudocyst, morbid obesity, bipolar disorder, obstructive sleep apnea who presented to the ER with abdominal pain rated as 8 out of 10.  Pain is constant and squeezing.  Not relieved by her home regimen.  Patient was also found to be hyperglycemic initiated on some insulin drip initially.  She has been on tramadol and oxycodone at home not controlling her pain.  CT scan shows persistent but improved changes in the pancreatic region.  Start on IV pain medicine and is being admitted for pain control.  Patient is scheduled to have a procedure by Dr. Meridee Score on April 28.  She denied any fever or chills no melena no bright red blood per rectum..  ED Course: Temperature 99.4 blood pressure 161/91 pulse 126 respiratory of 30 oxygen sat 96% on room air.  White count 8.0 hemoglobin 9.9 and platelet 346.  Sodium 132 potassium 3.3 chloride 99 CO2 22 glucose 210 creatinine 0.70.  Chest x-ray showed no acute cardiopulmonary disease.  CT abdomen pelvis on the 20 is as above.  Patient being admitted with abdominal pain for pain management  **Interim History  Diet has been in advanced from a clear liquid diet to full liquid diet and GI recommending not advancing past that however the patient has been sneaking food and had a bologna sandwich yesterday and a Heath Bar this morning.  GI recommending Cipro and they are recommending to keep the patient inpatient until her EUS with necrosectomy on 4/28.  She is to be n.p.o. after midnight and not complain of any abdominal pain but is being hungry.  Assessment & Plan:   Principal Problem:   Intractable abdominal pain Active Problems:   Hypokalemia   Schizophrenia (HCC)    Obesity, Class III, BMI 40-49.9 (morbid obesity) (HCC)   Necrotizing pancreatitis   Essential hypertension   Hyperosmolar hyperglycemic state (HHS) (HCC)   History of pancreatitis  Abdominal pain in the setting of her acute on chronic pancreatitis complicated by pseudocyst -Was intractable but now improving and does not have any currently -Has been following up with GI closely as she is status post EUS with necrosectomy on 08/26/2019 as well as 09/05/2019 -Underwent CT scan imaging which showed "Status post cysto gastrostomy with decompression of the size of the pancreatic pseudocyst. Wall thickening about the pseudocysts and increased inflammatory changes suggests secondary infection of the pseudocysts. Gas within the pseudocysts is likely due to the drainage catheter. Diffuse inflammatory changes throughout the body of the pancreas compatible with acute on chronic pancreatitis. Secondary inflammatory changes of the colon at the splenic flexure and of the duodenum."  Related to her necrotizing pancreatitis.   -Admitted and initiated pain management.   -Cw Supportive care.   -GI to follow with the patient and planning on necrosectomy in the a.m. -They recommend continuing Cipro and maintenance IV fluids and we have changed to LR at 75 mL's per hour. -Continue pain control and patient is to remain inpatient for her procedure on 09/18/2026 -Diet has been advanced to full liquid diet but will not advance past this; she will need to be n.p.o. at midnight. -Patient has been sneaking food and had a Heath bar and a bologna sandwich in the last 24 hours and has had  Abdominal Pain after  Constipation -Bowel regimen initiated -C/w Miralax 17 g po BID, bisacodyl 10 mg rectal suppository daily as needed 8.6 mg p.o. daily  Hypokalemia -Improved as patient's potassium went from 3.1 is now 3.7  -Continue to monitor and replete as necessary -Repeat CMP in a.m.  Hypomagnesemia -Patient's magnesium level is  1.6 -Replete with IV mag sulfate 2 g -Continue to monitor and replete as necessary -Repeat magnesium level in a.m.  Essential Hypertension -Continue with home regimen with amlodipine 5 mg p.o. daily, losartan 25 g p.o. daily, and propanolol 20 mg p.o. twice daily  Diabetes Mellitus Type 2 with hyperglycemia -Resume insulin with sliding scale. -Continue to monitor CBGs carefully; CBGs ranging from 137-172; blood sugar on CMP was 143 this a.m. -Continue to adjust insulin regimen  Hyponatremia -Mild. -Patient's sodium went from 128 and improved to 134 yesterday; today it is 133 -IV fluids have been adjusted now on LR -Continue monitor and trend repeat CMP in a.m.  History of Schizophrenia -Stable at this point.   -Resume home regimen with amitriptyline 75 mg p.o. nightly, clonazepam 0.5 mg p.o. daily, lamotrigine 200 mg p.o. daily, and lurasidone 60 mg p.o. nightly as well as trazodone 100 mg p.o. nightly  Hyperlipidemia -continue statin  GERD -Continue PPI with pantoprazole 40 mg p.o. twice daily  Morbid Obesity -Estimated body mass index is 44.25 kg/m as calculated from the following:   Height as of this encounter: 5\' 8"  (1.727 m).   Weight as of this encounter: 132 kg. -Weight Loss and Dietary Counseling given   DVT prophylaxis: Enoxaparin  Code Status: FULL CODE  Family Communication: No family present at bedside  Disposition Plan: Patient is to get an EUS and a necrosectomy on 4/28 and will need to remain inpatient; her diet will not be advanced past a full liquid diet however she keeps continuing to sneak food  Status is: Inpatient  Remains inpatient appropriate because:Ongoing diagnostic testing needed not appropriate for outpatient work up and Inpatient level of care appropriate due to severity of illness   Dispo: The patient is from: Home              Anticipated d/c is to: Home              Anticipated d/c date is: 2 days              Patient currently is  not medically stable to d/c.  Consultants:   Gastroenterology    Procedures: None  Antimicrobials:  Anti-infectives (From admission, onward)   Start     Dose/Rate Route Frequency Ordered Stop   09/15/19 2100  ciprofloxacin (CIPRO) tablet 500 mg     500 mg Oral 2 times daily 09/15/19 1934 09/20/19 1959     Subjective: Seen and examined at bedside continues to ask for her diet to be advanced.  She currently does not have any nausea or vomiting but did have some abdominal pain after she stuck a bony statin yesterday.  Wanting to eat.  No other concerns or complaints at this time and denies any chest pain, shortness of breath.  Objective: Vitals:   09/16/19 0615 09/16/19 1355 09/16/19 2020 09/17/19 0521  BP: 133/77 94/64 116/71 118/76  Pulse: 91 88 98 90  Resp: 17 17 20 20   Temp: 99 F (37.2 C) 98.8 F (37.1 C) 99.3 F (37.4 C) 99.4 F (37.4 C)  TempSrc: Oral Oral Oral Oral  SpO2: 95% 99% 98% 100%  Weight:  Height:        Intake/Output Summary (Last 24 hours) at 09/17/2019 1349 Last data filed at 09/16/2019 2251 Gross per 24 hour  Intake 666.7 ml  Output --  Net 666.7 ml   Filed Weights   09/14/19 2227  Weight: 132 kg   Examination: Physical Exam:  Constitutional: WN/WD morbidly obese female and no acute distress appears slightly agitated Eyes: Lids and conjunctivae normal, sclerae anicteric  ENMT: External Ears, Nose appear normal. Grossly normal hearing. Mucous membranes are moist.  Neck: Appears normal, supple, no cervical masses, normal ROM, no appreciable thyromegaly: No JVD Respiratory: Mildly diminished to auscultation bilaterally, no wheezing, rales, rhonchi or crackles. Normal respiratory effort and patient is not tachypenic. No accessory muscle use.  Cardiovascular: RRR, no murmurs / rubs / gallops. S1 and S2 auscultated. No extremity edema. Abdomen: Soft, non-tender, distended secondary to body habitus. Bowel sounds positive x4.  GU:  Deferred. Musculoskeletal: No clubbing / cyanosis of digits/nails. No joint deformity upper and lower extremities.  Skin: No rashes, lesions, ulcers on a limited skin evaluation. No induration; Warm and dry.  Neurologic: CN 2-12 grossly intact with no focal deficits. Romberg sign and cerebellar reflexes not assessed.  Psychiatric: Normal judgment and insight. Alert and oriented x 3. Mildly agitated mood and appropriate affect.   Data Reviewed: I have personally reviewed following labs and imaging studies  CBC: Recent Labs  Lab 09/14/19 2328 09/15/19 1948 09/16/19 0515 09/17/19 0515  WBC 6.7 8.0 7.2 6.8  NEUTROABS 3.9  --   --  3.5  HGB 9.9* 9.9* 9.4* 9.0*  HCT 32.5* 34.0* 32.7* 31.3*  MCV 71.0* 74.6* 74.8* 74.0*  PLT 370 346 378 546   Basic Metabolic Panel: Recent Labs  Lab 09/14/19 2328 09/14/19 2328 09/15/19 1030 09/15/19 1522 09/15/19 1948 09/16/19 0515 09/17/19 0515  NA 128*  --  131* 132*  --  134* 133*  K 3.7  --  3.1* 3.3*  --  3.7 3.7  CL 89*  --  98 99  --  101 100  CO2 25  --  25 22  --  24 24  GLUCOSE 682*  --  227* 210*  --  141* 143*  BUN 7  --  6 6  --  7 6  CREATININE 0.91   < > 0.68 0.71 0.73 0.72 0.75  CALCIUM 8.6*  --  8.6* 8.2*  --  8.7* 8.4*  MG  --   --   --   --   --   --  1.6*  PHOS  --   --   --   --   --   --  3.7   < > = values in this interval not displayed.   GFR: Estimated Creatinine Clearance: 118.3 mL/min (by C-G formula based on SCr of 0.75 mg/dL). Liver Function Tests: Recent Labs  Lab 09/14/19 2328 09/16/19 0515 09/17/19 0515  AST 14* 18 21  ALT 9 9 11   ALKPHOS 96 79 77  BILITOT 0.5 0.2* 0.3  PROT 7.7 6.5 6.5  ALBUMIN 3.0* 2.4* 2.2*   Recent Labs  Lab 09/14/19 2328  LIPASE 14   No results for input(s): AMMONIA in the last 168 hours. Coagulation Profile: No results for input(s): INR, PROTIME in the last 168 hours. Cardiac Enzymes: No results for input(s): CKTOTAL, CKMB, CKMBINDEX, TROPONINI in the last 168  hours. BNP (last 3 results) No results for input(s): PROBNP in the last 8760 hours. HbA1C: No results for input(s):  HGBA1C in the last 72 hours. CBG: Recent Labs  Lab 09/16/19 2017 09/16/19 2326 09/17/19 0518 09/17/19 0723 09/17/19 1203  GLUCAP 298* 283* 147* 172* 137*   Lipid Profile: No results for input(s): CHOL, HDL, LDLCALC, TRIG, CHOLHDL, LDLDIRECT in the last 72 hours. Thyroid Function Tests: No results for input(s): TSH, T4TOTAL, FREET4, T3FREE, THYROIDAB in the last 72 hours. Anemia Panel: No results for input(s): VITAMINB12, FOLATE, FERRITIN, TIBC, IRON, RETICCTPCT in the last 72 hours. Sepsis Labs: No results for input(s): PROCALCITON, LATICACIDVEN in the last 168 hours.  Recent Results (from the past 240 hour(s))  SARS CORONAVIRUS 2 (TAT 6-24 HRS) Nasopharyngeal Nasopharyngeal Swab     Status: None   Collection Time: 09/14/19 11:58 PM   Specimen: Nasopharyngeal Swab  Result Value Ref Range Status   SARS Coronavirus 2 NEGATIVE NEGATIVE Final    Comment: (NOTE) SARS-CoV-2 target nucleic acids are NOT DETECTED. The SARS-CoV-2 RNA is generally detectable in upper and lower respiratory specimens during the acute phase of infection. Negative results do not preclude SARS-CoV-2 infection, do not rule out co-infections with other pathogens, and should not be used as the sole basis for treatment or other patient management decisions. Negative results must be combined with clinical observations, patient history, and epidemiological information. The expected result is Negative. Fact Sheet for Patients: HairSlick.no Fact Sheet for Healthcare Providers: quierodirigir.com This test is not yet approved or cleared by the [place] FDA and  has been authorized for detection and/or diagnosis of SARS-CoV-2 by FDA under an Emergency Use Authorization (EUA). This EUA will remain  in effect (meaning this test can be used)  for the duration of the COVID-19 declaration under Section 56 4(b)(1) of the Act, 21 U.S.C. section 360bbb-3(b)(1), unless the authorization is terminated or revoked sooner. Performed at [facility], [address], [place], [place] [address]      RN Pressure Injury Documentation:     Estimated body mass index is 44.25 kg/m as calculated from the following:   Height as of this encounter: 5\' 8"  (1.727 m).   Weight as of this encounter: 132 kg.  Malnutrition Type:      Malnutrition Characteristics:      Nutrition Interventions:    Radiology Studies: No results found.  Scheduled Meds: . amitriptyline  75 mg Oral QHS  . amLODipine  5 mg Oral Daily  . atorvastatin  40 mg Oral Daily  . ciprofloxacin  500 mg Oral BID  . clonazePAM  0.5 mg Oral Daily  . enoxaparin (LOVENOX) injection  60 mg Subcutaneous Q24H  . feeding supplement  1 Container Oral TID BM  . insulin aspart  3-9 Units Subcutaneous Q4H  . insulin detemir  12 Units Subcutaneous Q12H  . lamoTRIgine  200 mg Oral Daily  . losartan  25 mg Oral Daily  . Lurasidone HCl  1 tablet Oral QHS  . pantoprazole  40 mg Oral BID  . propranolol  20 mg Oral BID  . senna  1 tablet Oral Daily  . traZODone  100 mg Oral QHS   Continuous Infusions: . lactated ringers 75 mL/hr at 09/17/19 0923    LOS: 0 days   09/19/19, DO [facility] PAGER is on AMION  If 7PM-7AM, please contact night-coverage [email or web address]

## 2019-09-17 NOTE — H&P (View-Only) (Signed)
     Progress Note  CC:   Pancreatitis       ASSESSMENT AND PLAN:   54-year-old female with PMH significant for bipolar disorder, schizophrenia, diabetes, hypertension, morbid obesity, OSA, necrotizing pancreatitis  # Acute on chronic pancreatitis complicated by pseudocysts/ WON.  --She is s/p EUS with necrosectomy 08/26/19 and 09/05/19 --Scheduled for another necrosectomy 09/18/19. Was to be done here at [facility] anyway so can still get done.  --Most recent CT scan 4/20 shows acute on chronic pancreatitis, decompression of pseudocystswall thickening about the pseudocyst and increased inflammatory changes suggest secondary infection of the pseudocyst.  Gas within the pseudocyst felt likely to be from the drainage catheter.   --Continue Cipro --NPO after MN   SUBJECTIVE   Says she feels okay. No abdominal pain right now. Complains of being hungry    OBJECTIVE:     Vital signs in last 24 hours: Temp:  [98.8 F (37.1 C)-99.4 F (37.4 C)] 99.4 F (37.4 C) (04/27 0521) Pulse Rate:  [88-98] 90 (04/27 0521) Resp:  [17-20] 20 (04/27 0521) BP: (94-118)/(64-76) 118/76 (04/27 0521) SpO2:  [98 %-100 %] 100 % (04/27 0521) Last BM Date: 09/12/19 General:   Alert, NAD Heart:  Regular rate and rhythm;  No lower extremity edema   Pulm: Normal respiratory effort   Abdomen:  Soft,  nontender, nondistended.  Normal bowel sounds.          Neurologic:  Alert and  oriented,  grossly normal neurologically. Psych:   cooperative.  Normal mood and affect.   Intake/Output from previous day: 04/26 0701 - 04/27 0700 In: 1123.4 [I.V.:1123.4] Out: 300 [Urine:300] Intake/Output this shift: No intake/output data recorded.  Lab Results: Recent Labs    09/15/19 1948 09/16/19 0515 09/17/19 0515  WBC 8.0 7.2 6.8  HGB 9.9* 9.4* 9.0*  HCT 34.0* 32.7* 31.3*  PLT 346 378 313   BMET Recent Labs    09/15/19 1522 09/15/19 1522 09/15/19 1948 09/16/19 0515 09/17/19 0515  NA 132*  --   --  134*  133*  K 3.3*  --   --  3.7 3.7  CL 99  --   --  101 100  CO2 22  --   --  24 24  GLUCOSE 210*  --   --  141* 143*  BUN 6  --   --  7 6  CREATININE 0.71   < > 0.73 0.72 0.75  CALCIUM 8.2*  --   --  8.7* 8.4*   < > = values in this interval not displayed.   LFT Recent Labs    09/17/19 0515  PROT 6.5  ALBUMIN 2.2*  AST 21  ALT 11  ALKPHOS 77  BILITOT 0.3      Principal Problem:   Intractable abdominal pain Active Problems:   Hypokalemia   Schizophrenia (HCC)   Obesity, Class III, BMI 40-49.9 (morbid obesity) (HCC)   Necrotizing pancreatitis   Essential hypertension   Hyperosmolar hyperglycemic state (HHS) (HCC)   History of pancreatitis     LOS: 0 days   Rawley Harju ,NP 09/17/2019, 10:19 AM       

## 2019-09-17 NOTE — Progress Notes (Signed)
   09/15/19 1829  Assess: MEWS Score  Temp 99.4 F (37.4 C)  BP (!) 144/89  Pulse Rate (!) 122  Resp 18  SpO2 98 %  Assess: MEWS Score  MEWS Temp 0  MEWS Systolic 0  MEWS Pulse 2  MEWS RR 0  MEWS LOC 0  MEWS Score 2  MEWS Score Color Yellow  Assess: if the MEWS score is Yellow or Red  Were vital signs taken at a resting state? Yes  Focused Assessment Documented focused assessment  Early Detection of Sepsis Score *See Row Information* Low  MEWS guidelines implemented *See Row Information* Yes  Treat  MEWS Interventions Escalated (See documentation below)  Take Vital Signs  Increase Vital Sign Frequency  Yellow: Q 2hr X 2 then Q 4hr X 2, if remains yellow, continue Q 4hrs  Escalate  MEWS: Escalate Yellow: discuss with charge nurse/RN and consider discussing with provider and RRT  Notify: Charge Nurse/RN  Name of Charge Nurse/RN Notified Abby P  Date Charge Nurse/RN Notified 09/15/19  Time Charge Nurse/RN Notified 1835

## 2019-09-18 ENCOUNTER — Ambulatory Visit (HOSPITAL_COMMUNITY): Admission: RE | Admit: 2019-09-18 | Payer: Medicare Other | Source: Home / Self Care | Admitting: Gastroenterology

## 2019-09-18 ENCOUNTER — Inpatient Hospital Stay (HOSPITAL_COMMUNITY): Payer: Medicare Other | Admitting: Anesthesiology

## 2019-09-18 ENCOUNTER — Encounter (HOSPITAL_COMMUNITY): Payer: Self-pay | Admitting: Family Medicine

## 2019-09-18 ENCOUNTER — Encounter (HOSPITAL_COMMUNITY)
Admission: EM | Disposition: A | Discharge status: Home / Self Care | Payer: Self-pay | Source: Home / Self Care | Attending: Internal Medicine

## 2019-09-18 ENCOUNTER — Inpatient Hospital Stay (HOSPITAL_COMMUNITY): Payer: Medicare Other

## 2019-09-18 ENCOUNTER — Telehealth: Payer: Self-pay

## 2019-09-18 DIAGNOSIS — K8591 Acute pancreatitis with uninfected necrosis, unspecified: Secondary | ICD-10-CM

## 2019-09-18 DIAGNOSIS — K228 Other specified diseases of esophagus: Secondary | ICD-10-CM

## 2019-09-18 DIAGNOSIS — K8689 Other specified diseases of pancreas: Secondary | ICD-10-CM

## 2019-09-18 HISTORY — PX: STENT REMOVAL: SHX6421

## 2019-09-18 HISTORY — PX: FOREIGN BODY REMOVAL: SHX962

## 2019-09-18 HISTORY — PX: BIOPSY: SHX5522

## 2019-09-18 HISTORY — PX: PANCREATIC STENT PLACEMENT: SHX5539

## 2019-09-18 HISTORY — PX: ESOPHAGOGASTRODUODENOSCOPY (EGD) WITH PROPOFOL: SHX5813

## 2019-09-18 LAB — COMPREHENSIVE METABOLIC PANEL
ALT: 9 U/L (ref 0–44)
AST: 15 U/L (ref 15–41)
Albumin: 2.3 g/dL — ABNORMAL LOW (ref 3.5–5.0)
Alkaline Phosphatase: 74 U/L (ref 38–126)
Anion gap: 9 (ref 5–15)
BUN: <5 mg/dL — ABNORMAL LOW (ref 6–20)
CO2: 24 mmol/L (ref 22–32)
Calcium: 8.3 mg/dL — ABNORMAL LOW (ref 8.9–10.3)
Chloride: 101 mmol/L (ref 98–111)
Creatinine, Ser: 0.78 mg/dL (ref 0.44–1.00)
GFR calc Af Amer: >60 mL/min (ref >60)
GFR calc non Af Amer: >60 mL/min (ref >60)
Glucose, Bld: 221 mg/dL — ABNORMAL HIGH (ref 70–99)
Potassium: 4.1 mmol/L (ref 3.5–5.1)
Sodium: 134 mmol/L — ABNORMAL LOW (ref 135–145)
Total Bilirubin: 0.5 mg/dL (ref 0.3–1.2)
Total Protein: 6.2 g/dL — ABNORMAL LOW (ref 6.5–8.1)

## 2019-09-18 LAB — CBC WITH DIFFERENTIAL/PLATELET
Abs Immature Granulocytes: 0.08 10*3/uL — ABNORMAL HIGH (ref 0.00–0.07)
Basophils Absolute: 0 10*3/uL (ref 0.0–0.1)
Basophils Relative: 0 %
Eosinophils Absolute: 0.1 10*3/uL (ref 0.0–0.5)
Eosinophils Relative: 1 %
HCT: 32.9 % — ABNORMAL LOW (ref 36.0–46.0)
Hemoglobin: 9.5 g/dL — ABNORMAL LOW (ref 12.0–15.0)
Immature Granulocytes: 1 %
Lymphocytes Relative: 31 %
Lymphs Abs: 2 10*3/uL (ref 0.7–4.0)
MCH: 21.9 pg — ABNORMAL LOW (ref 26.0–34.0)
MCHC: 28.9 g/dL — ABNORMAL LOW (ref 30.0–36.0)
MCV: 75.8 fL — ABNORMAL LOW (ref 80.0–100.0)
Monocytes Absolute: 1 10*3/uL (ref 0.1–1.0)
Monocytes Relative: 17 %
Neutro Abs: 3.1 10*3/uL (ref 1.7–7.7)
Neutrophils Relative %: 50 %
Platelets: 299 10*3/uL (ref 150–400)
RBC: 4.34 MIL/uL (ref 3.87–5.11)
RDW: 15 % (ref 11.5–15.5)
WBC: 6.2 10*3/uL (ref 4.0–10.5)
nRBC: 0 % (ref 0.0–0.2)

## 2019-09-18 LAB — GLUCOSE, CAPILLARY
Glucose-Capillary: 230 mg/dL — ABNORMAL HIGH (ref 70–99)
Glucose-Capillary: 303 mg/dL — ABNORMAL HIGH (ref 70–99)
Glucose-Capillary: 299 mg/dL — ABNORMAL HIGH (ref 70–99)
Glucose-Capillary: 266 mg/dL — ABNORMAL HIGH (ref 70–99)

## 2019-09-18 LAB — PHOSPHORUS: Phosphorus: 4.3 mg/dL (ref 2.5–4.6)

## 2019-09-18 LAB — MAGNESIUM: Magnesium: 1.9 mg/dL (ref 1.7–2.4)

## 2019-09-18 IMAGING — DX DG ABDOMEN 2V
2 series · 2 of 2 positions shown · non-contrast
Comparison: [DATE]

CLINICAL DATA: Check pancreatic stent placement

EXAM:
ABDOMEN - 2 VIEW

[abdomen supine (1 of 2)]
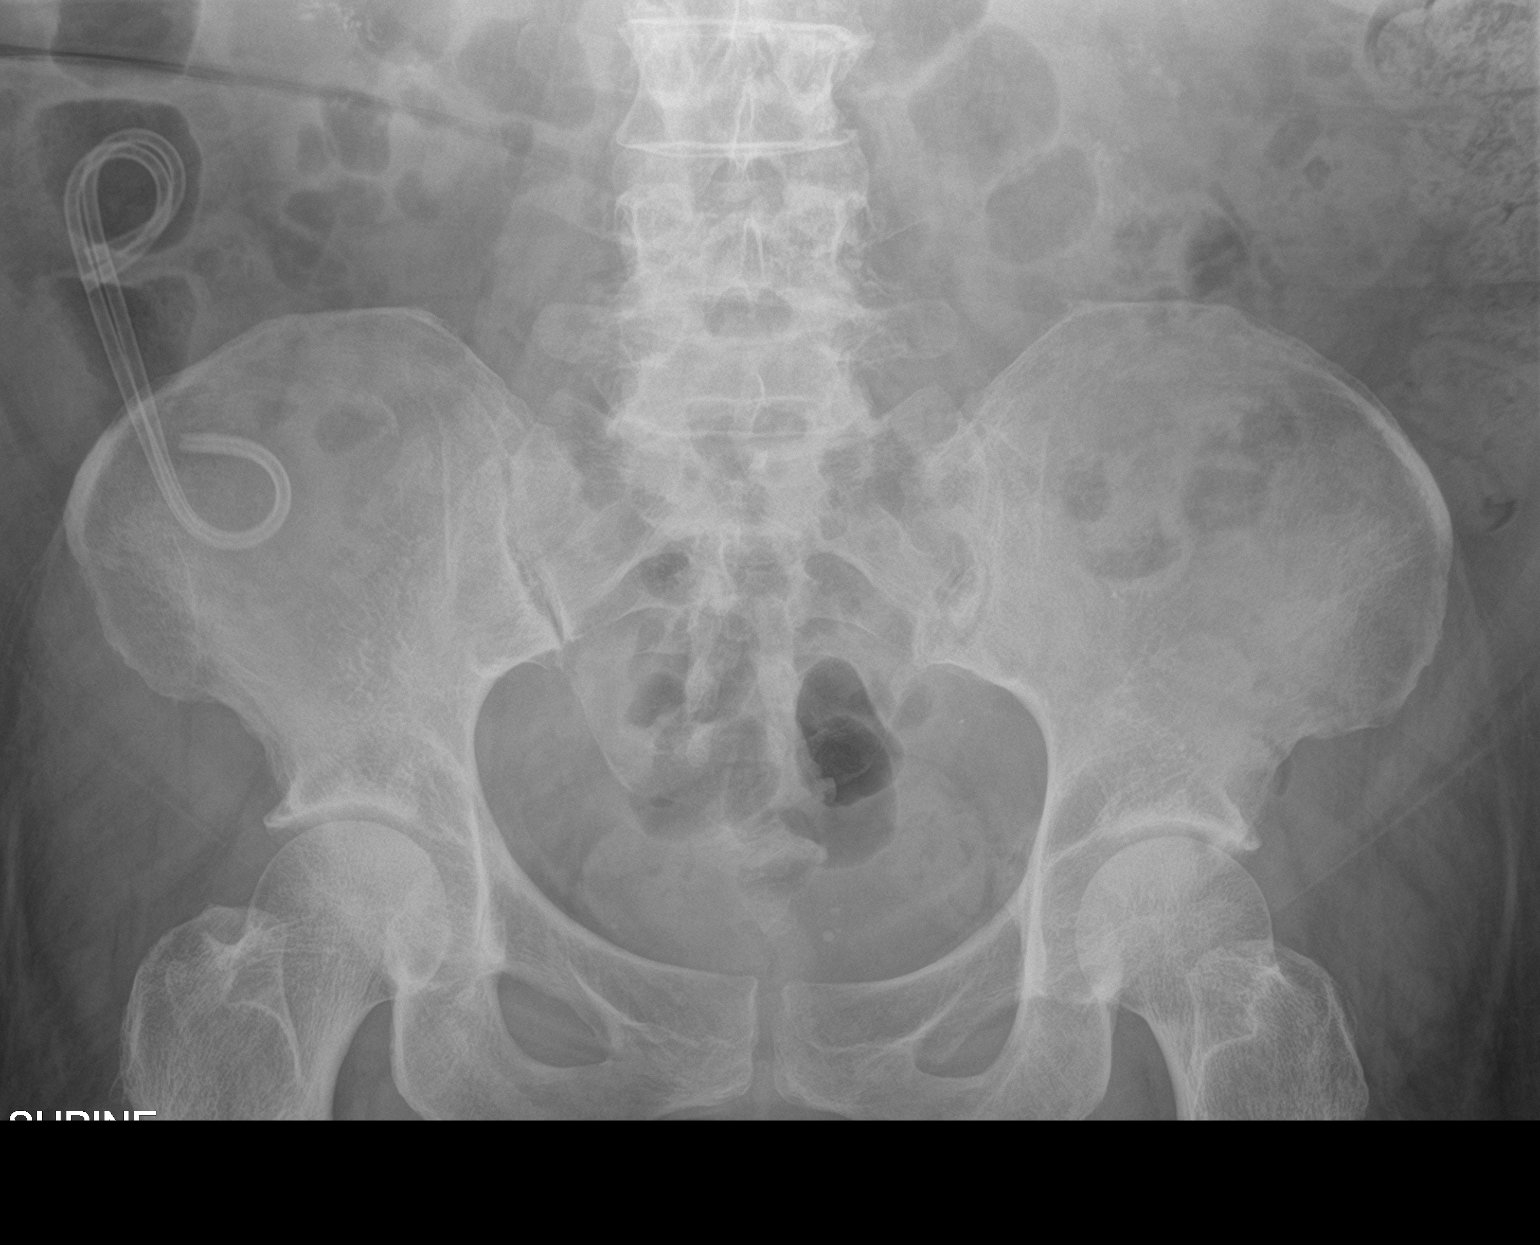

[abdomen supine (2 of 2)]
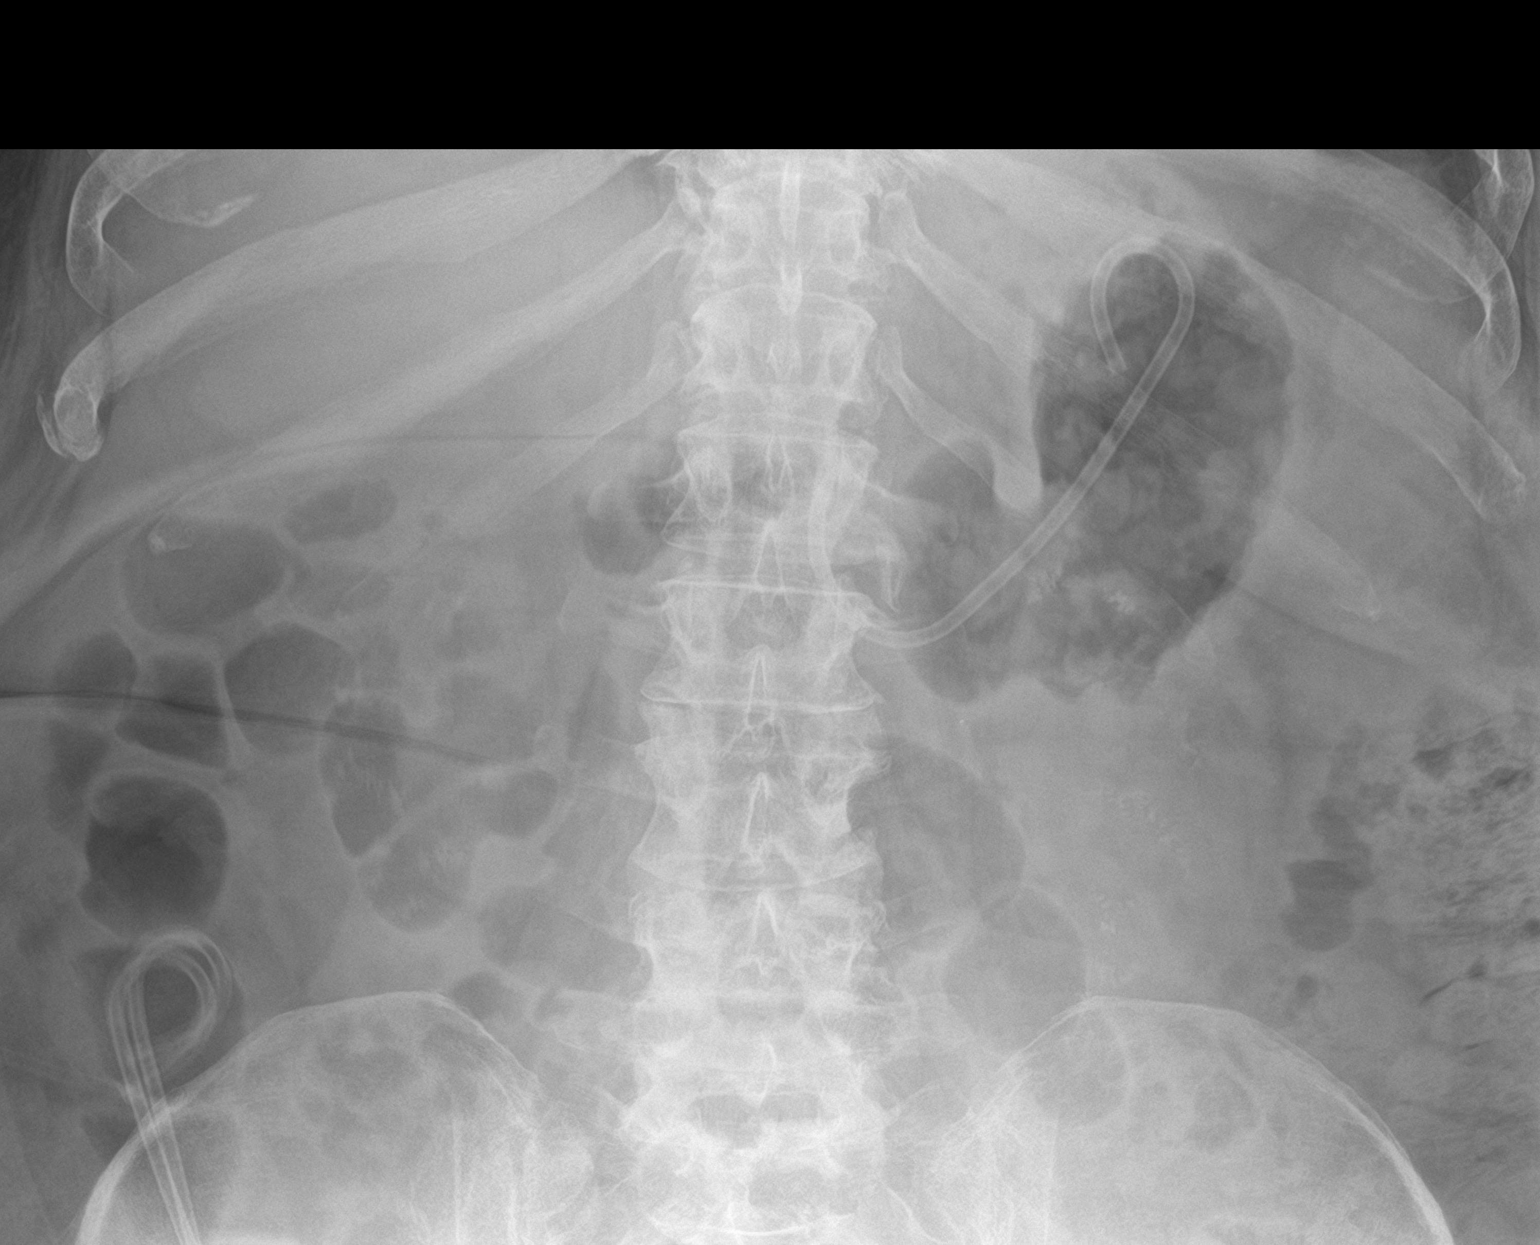

[2 of 2 positions shown; findings below may reference images not displayed]

FINDINGS: Previously seen Axios cystogastrostomy stent has been removed in the
interval. A new double-J ureteral stent is noted extending from the
stomach into the pancreatic pseudocyst. Two previously placed
pigtail catheters are identified and have migrated into the
ascending colon slightly more proximal within the ascending colon
when compared with the prior study. Scattered fecal material is
noted. No free air is seen. No bony abnormality is noted.
IMPRESSION: New cystogastrostomy stent in satisfactory position.

Previously seen Axios stent has been removed.

Previously placed double pigtail stents are again noted within the
ascending colon but slightly more proximal than that seen on the
recent exam.

## 2019-09-18 SURGERY — ESOPHAGOGASTRODUODENOSCOPY (EGD) WITH PROPOFOL
Anesthesia: General

## 2019-09-18 MED ORDER — SUGAMMADEX SODIUM 500 MG/5ML IV SOLN
INTRAVENOUS | Status: DC | PRN
Start: 2019-09-18 — End: 2019-09-18
  Administered 2019-09-18: 300 mg via INTRAVENOUS

## 2019-09-18 MED ORDER — CIPROFLOXACIN IN D5W 400 MG/200ML IV SOLN
INTRAVENOUS | Status: DC | PRN
Start: 2019-09-18 — End: 2019-09-18
  Administered 2019-09-18: 400 mg via INTRAVENOUS

## 2019-09-18 MED ORDER — SUCCINYLCHOLINE CHLORIDE 200 MG/10ML IV SOSY
PREFILLED_SYRINGE | INTRAVENOUS | Status: DC | PRN
  Administered 2019-09-18: 140 mg via INTRAVENOUS

## 2019-09-18 MED ORDER — ROCURONIUM BROMIDE 100 MG/10ML IV SOLN
INTRAVENOUS | Status: DC | PRN
  Administered 2019-09-18: 40 mg via INTRAVENOUS

## 2019-09-18 MED ORDER — LACTATED RINGERS IV SOLN: INTRAVENOUS | Status: DC | PRN

## 2019-09-18 MED ORDER — FENTANYL CITRATE (PF) 100 MCG/2ML IJ SOLN
INTRAMUSCULAR | Status: DC | PRN
  Administered 2019-09-18 (×4): 50 ug via INTRAVENOUS

## 2019-09-18 MED ORDER — ONDANSETRON HCL 4 MG/2ML IJ SOLN
INTRAMUSCULAR | Status: DC | PRN
  Administered 2019-09-18 (×2): 4 mg via INTRAVENOUS

## 2019-09-18 MED ORDER — LIDOCAINE HCL (CARDIAC) PF 100 MG/5ML IV SOSY
PREFILLED_SYRINGE | INTRAVENOUS | Status: DC | PRN
  Administered 2019-09-18: 80 mg via INTRAVENOUS

## 2019-09-18 MED ORDER — FLUCONAZOLE 200 MG PO TABS
200.0000 mg | ORAL_TABLET | Freq: Every day | ORAL | Status: DC
  Administered 2019-09-19: 200 mg via ORAL
  Filled 2019-09-18: qty 2
  Filled 2019-09-18: qty 1

## 2019-09-18 MED ORDER — FLUCONAZOLE 200 MG PO TABS
400.0000 mg | ORAL_TABLET | Freq: Once | ORAL | Status: AC
  Administered 2019-09-18: 400 mg via ORAL
  Filled 2019-09-18: qty 2
  Filled 2019-09-18: qty 4

## 2019-09-18 MED ORDER — AMOXICILLIN-POT CLAVULANATE 875-125 MG PO TABS
1.0000 | ORAL_TABLET | Freq: Two times a day (BID) | ORAL | Status: DC
  Administered 2019-09-18 – 2019-09-19 (×2): 1 via ORAL
  Filled 2019-09-18 (×3): qty 1

## 2019-09-18 MED ORDER — PROPOFOL 10 MG/ML IV BOLUS
INTRAVENOUS | Status: DC | PRN
  Administered 2019-09-18: 200 mg via INTRAVENOUS

## 2019-09-18 SURGICAL SUPPLY — 15 items

## 2019-09-18 NOTE — Anesthesia Postprocedure Evaluation (Signed)
Anesthesia Post Note  Patient: Monique Holland  Procedure(s) Performed: ESOPHAGOGASTRODUODENOSCOPY (EGD) WITH PROPOFOL (N/A ) BIOPSY FOREIGN BODY REMOVAL STENT REMOVAL PANCREATIC STENT PLACEMENT     Patient location during evaluation: PACU Anesthesia Type: General Level of consciousness: awake and alert Pain management: pain level controlled Vital Signs Assessment: post-procedure vital signs reviewed and stable Respiratory status: spontaneous breathing, nonlabored ventilation, respiratory function stable and patient connected to nasal cannula oxygen Cardiovascular status: blood pressure returned to baseline and stable Postop Assessment: no apparent nausea or vomiting Anesthetic complications: no    Last Vitals:  Vitals:   09/18/19 1100 09/18/19 1115  BP: 135/84 135/90  Pulse: 95 92  Resp: 16 (!) 23  Temp:    SpO2: 99% 97%    Last Pain:  Vitals:   09/18/19 1200  TempSrc:   PainSc: 0-No pain                 Ugochukwu Chichester

## 2019-09-18 NOTE — Telephone Encounter (Signed)
-----   Message from Lemar Lofty., MD sent at 09/18/2019 11:33 AM EDT ----- Monique Holland, The patient needs a CT abdomen/pelvis with IV and oral contrast to be performed in 2 to 3 weeks. Tentatively set up an EGD with stent pull in 6 weeks in the hospital-based setting. Vito, As we discussed, the cyst cavity is now clean and we will arrange the follow-up in regards to stent pull. 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 Think about the genetics testing that we had discussed as well. Thanks. GM

## 2019-09-18 NOTE — Op Note (Signed)
[facility] Patient Name: Monique Holland Procedure Date: 09/18/2019 MRN: [id number] Attending MD: Justice Britain , MD Date of Birth: 02-May-1966 CSN: [id number] Age: 54 Admit Type: Inpatient Procedure:                Upper GI endoscopy Indications:              Epigastric abdominal pain, Pancreatic necrosis,                            Pancreatic pseudocyst Providers:                Justice Britain, MD, Jeanella Cara, RN,                            Elspeth Cho Tech., Technician, Stephanie                            [place] CRNA Referring MD:             Gerrit Heck, MD, [facility] Medicines:                General Anesthesia, Cipro 622 mg IV Complications:            No immediate complications. Estimated Blood Loss:     Estimated blood loss was minimal. Procedure:                Pre-Anesthesia Assessment:                           - Prior to the procedure, a History and Physical                            was performed, and patient medications and                            allergies were reviewed. The patient's tolerance of                            previous anesthesia was also reviewed. The risks                            and benefits of the procedure and the sedation                            options and risks were discussed with the patient.                            All questions were answered, and informed consent                            was obtained. Prior Anticoagulants: The patient has                            taken no previous anticoagulant or antiplatelet  agents. ASA Grade Assessment: III - A patient with                            severe systemic disease. After reviewing the risks                            and benefits, the patient was deemed in                            satisfactory condition to undergo the procedure.                           After obtaining informed consent, the endoscope  was                            passed under direct vision. Throughout the                            procedure, the patient's blood pressure, pulse, and                            oxygen saturations were monitored continuously. The                            [id number] ([id number] Olympus therapeutic endoscope                            was introduced through the mouth, and advanced to                            the second part of duodenum. The upper GI endoscopy                            was accomplished without difficulty. The patient                            tolerated the procedure. Scope In: Scope Out: Findings:      White nummular lesions were noted in the entire esophagus - likely       recurrent Candida. Biopsies were taken with a cold forceps for histology.      A previously placed AXIOS cystgastrostomy stent was found on the       posterior wall of the gastric body. The previously placed and visualized       double pigtail stents were not visible today - suggesting they had       already migrated. The cystgastrostomy tract was entered under direct       vision. The cyst was partially filled with fluid and black necrotic       tissue that was pasty and adherent to the cyst wall as well as some pus       noted in region. Necrosectomy was performed with a Raptor grasping       device, rat-toothed forceps, endoscopic cap, and Roth net, requiring       multiple intubations of the cyst. We were able to get to a deeper  portion of the cyst than I had previously entered. Once the large       necroma was removed from that area, significant improvement and drainage       was noted and no further pus was noted. At the conclusion of the       procedure, very little necrotic tissue and a medium amount of pink,       viable tissue was found within the pseudocyst on direct vision. The       AXIOS stent was removed. One 10 French x 7 cm double pigtail stent was       inserted across the  cystgastrostomy tract to keep it intact.      No gross lesions were noted in the duodenal bulb, in the first portion       of the duodenum and in the second portion of the duodenum. Impression:               - White nummular lesions in esophageal mucosa -                            likely recurrent Candida. Biopsied.                           - Pre-existing AXIOS cystgastrostomy was in place.                            Double pigtail stents were not present any further.                            Pancreatic necrosis was noted. Necrosectomy was                            performed. At end of procedure improvement noted in                            the entire cyst cavity with little amount of                            necrosis still present. 7 Fr x 7 cm double pigtail                            stent placed to continue to allow acid to enter                            into the cavity and clean it.                           - No gross lesions in the duodenal bulb, in the                            first portion of the duodenum and in the second                            portion of the duodenum. Moderate Sedation:      Not Applicable - Patient had care per  Anesthesia. Recommendation:           - The patient will be observed post-procedure,                            until all discharge criteria are met.                           - Return patient to hospital ward for ongoing care.                           - Advance diet as tolerated.                           - Would plan to continue Antibiotics for 1 week                            more. OK to transition at discharge to Augmentin                            875 mg BID.                           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                           - Recommend KUB 2-view to see whether pigtail                            stents have already  migrated through the body -                            suspect so.                           - Plan for repeat CT-Abdomen Pelvis in 2-3 weeks to                            ensure further improvement in the cyst/necroma                            region. Then plan to repeat EGD for stent pull in                            next 4-6 weeks.                           - Hold on restarting PPI until repeat CT scan is                            performed.                           -  The findings and recommendations were discussed                            with the patient.                           - The findings and recommendations were discussed                            with the patient's family. Procedure Code(s):        --- Professional ---                           [phone], Esophagogastroduodenoscopy, flexible,                            transoral; with biopsy, single or multiple                           48999, Unlisted procedure, pancreas Diagnosis Code(s):        --- Professional ---                           K22.8, Other specified diseases of esophagus                           Z97.8, Presence of other specified devices                           R10.13, Epigastric pain                           K86.89, Other specified diseases of pancreas                           K86.3, Pseudocyst of pancreas CPT copyright 2019 American Medical Association. All rights reserved. The codes documented in this report are preliminary and upon coder review may  be revised to meet current compliance requirements. Justice Britain, MD 09/18/2019 11:09:54 AM Number of Addenda: 0

## 2019-09-18 NOTE — Transfer of Care (Signed)
Immediate Anesthesia Transfer of Care Note  Patient: Monique Holland  Procedure(s) Performed: ESOPHAGOGASTRODUODENOSCOPY (EGD) WITH PROPOFOL (N/A ) BIOPSY FOREIGN BODY REMOVAL STENT REMOVAL PANCREATIC STENT PLACEMENT  Patient Location: PACU  Anesthesia Type:General  Level of Consciousness: sedated  Airway & Oxygen Therapy: Patient Spontanous Breathing and Patient connected to face mask oxygen  Post-op Assessment: Report given to RN and Post -op Vital signs reviewed and stable  Post vital signs: Reviewed and stable  Last Vitals:  Vitals Value Taken Time  BP 138/80 09/18/19 1054  Temp    Pulse 95 09/18/19 1057  Resp 16 09/18/19 1057  SpO2 100 % 09/18/19 1057  Vitals shown include unvalidated device data.  Last Pain:  Vitals:   09/18/19 0815  TempSrc:   PainSc: 0-No pain      Patients Stated Pain Goal: 1 (09/17/19 1719)  Complications: No apparent anesthesia complications

## 2019-09-18 NOTE — Progress Notes (Signed)
Initial Nutrition Assessment  DOCUMENTATION CODES:   Morbid obesity  INTERVENTION:  - diet advancement as medically feasible.  - will complete NFPE at follow-up.    NUTRITION DIAGNOSIS:   Inadequate oral intake related to acute illness, inability to eat as evidenced by NPO status.  GOAL:   Patient will meet greater than or equal to 90% of their needs  MONITOR:   Diet advancement, Labs, Weight trends  REASON FOR ASSESSMENT:   Malnutrition Screening Tool  ASSESSMENT:   54 y.o. female with medical history of necrotizing pancreatitis, DM, HTN, pancreatic pseudocyst, morbid obesity, bipolar disorder, and OSA. She presented to the ED with abdominal pain (8 out of 10). In the ED, she was found to be hyperglycemic and insulin drip was initiated. CT in the ED showed persistent but improved changes in the pancreatic region.  Diet advanced from NPO to CLD on 4/25 at 1935 and to FLD on 4/26 at 1454. She was made NPO at midnight. No intakes documented since admission. Patient is currently out of the room to Endoscopy; notes indicate this is for EUS and necrosectomy.  She was last seen in person by a RD on 3/15 at which time she was eating 100% of meals without issue although was admitted at that time d/t N/V and abdominal pain.   Per chart review, weight today is 291 lb, weight on 4/5 was 298 lb, and weight on 08/06/19 was 304 lb. This indicates 7 lb weight loss (2.3% body weight) in the past 3 weeks; not significant for time frame. Also indicates 13 lb weight loss (4.3% body weight) in the past 5 weeks; not significant for time frame.   Boost Breeze was ordered TID when diet was previously advanced to CLD.   Per notes: - abdominal pain d/t acute on chronic pancreatitis complicated by pseudocyst - constipation--bowel regimen ordered - hyperglycemia on admission - hx of schizophrenia   Labs reviewed; CBG: 230 mg/dl, Na: 161 mmol/l, BUN: <5 mg/dl, Ca: 8.3 mg/dl. Medications reviewed;  sliding scale novolog, 4 units novolog/day, 40 mg IV protonix BID, 1 packet miralax BID, 1 packet miralax BID, 1 tablet senokot/day.  IVF; NS @ 20 ml/hr; LR @ 75 ml/hr.    NUTRITION - FOCUSED PHYSICAL EXAM:  unable to complete at this time.   Diet Order:   Diet Order            Diet NPO time specified  Diet effective midnight              EDUCATION NEEDS:   Not appropriate for education at this time  Skin:  Skin Assessment: Reviewed RN Assessment  Last BM:  4/22  Height:   Ht Readings from Last 1 Encounters:  09/18/19 5\' 8"  (1.727 m)    Weight:   Wt Readings from Last 1 Encounters:  09/18/19 132 kg    Estimated Nutritional Needs:  Kcal:  1980-2240 kcal Protein:  100-115 grams Fluid:  >/= 2 L/day     09/20/19, MS, RD, LDN, CNSC Inpatient Clinical Dietitian RD pager # available in [facility]  After hours/weekend pager # available in [facility]

## 2019-09-18 NOTE — Plan of Care (Signed)
  Problem: Health Behavior/Discharge Planning: Goal: Ability to manage health-related needs will improve Outcome: Progressing   Problem: Clinical Measurements: Goal: Ability to maintain clinical measurements within normal limits will improve Outcome: Completed/Met Goal: Will remain free from infection Outcome: Progressing Goal: Diagnostic test results will improve Outcome: Progressing Goal: Respiratory complications will improve Outcome: Completed/Met Goal: Cardiovascular complication will be avoided Outcome: Completed/Met   Problem: Nutrition: Goal: Adequate nutrition will be maintained Outcome: Progressing   Problem: Coping: Goal: Level of anxiety will decrease Outcome: Progressing   Problem: Elimination: Goal: Will not experience complications related to bowel motility Outcome: Progressing Goal: Will not experience complications related to urinary retention Outcome: Completed/Met   Problem: Pain Managment: Goal: General experience of comfort will improve Outcome: Progressing   Problem: Safety: Goal: Ability to remain free from injury will improve Outcome: Progressing   Problem: Skin Integrity: Goal: Risk for impaired skin integrity will decrease Outcome: Progressing

## 2019-09-18 NOTE — Anesthesia Procedure Notes (Signed)
Procedure Name: Intubation Date/Time: 09/18/2019 9:28 AM Performed by: [place] Alexsis Kathman C, CRNA Pre-anesthesia Checklist: Patient identified, Emergency Drugs available, Suction available and Patient being monitored Patient Re-evaluated:Patient Re-evaluated prior to induction Oxygen Delivery Method: Circle system utilized Preoxygenation: Pre-oxygenation with 100% oxygen Induction Type: IV induction and Rapid sequence Laryngoscope Size: Mac and 3 Grade View: Grade I Tube type: Oral Tube size: 7.0 mm Number of attempts: 1 Airway Equipment and Method: Stylet and Oral airway Placement Confirmation: ETT inserted through vocal cords under direct vision,  positive ETCO2 and breath sounds checked- equal and bilateral Tube secured with: Tape Dental Injury: Teeth and Oropharynx as per pre-operative assessment

## 2019-09-18 NOTE — Progress Notes (Signed)
PROGRESS NOTE    Monique Holland  YHC:[id number] DOB: May 14, 1966 DOA: 09/14/2019 PCP: Kristie Cowman, MD   Brief Narrative: Monique Holland is a 54 y.o. femalewith medical history significant ofnecrotizing pancreatitis, diabetes, hypertension, pancreatic pseudocyst, morbid obesity, bipolar disorder, obstructive sleep apnea. Patient presented secondary to acute abdominal pain. GI consulted for management of necrotizing pancreatitis.   Assessment & Plan:   Principal Problem:   Intractable abdominal pain Active Problems:   Hypokalemia   Schizophrenia (HCC)   Obesity, Class III, BMI 40-49.9 (morbid obesity) (HCC)   Necrotizing pancreatitis   Essential hypertension   Hyperosmolar hyperglycemic state (HHS) ([place])   History of pancreatitis   Intractable abdominal pain Necrotizing pancreatitis GI consulted. Patient's pain managed on prn Oxycodone and Dilaudid IV prn. GI performed upper endoscopy on 4/28 with necrosectomy. Patient also found to have likely recurrent esophageal candidiasis -GI recommendations: Diflucan, Augmentin, observe for 24 hours  Constipation -Miralax daily -Dulcolax prn  Hypokalemia Resolved.  Hypomagnesemia Resolved.  Essential hypertension -Continue amlodipine, losartan, propanolol  Diabetes mellitus, type 2 -Continue SSI  Hyponatremia Mild. Stable.  History of schizophrenia Depression -Continue Lurasidone, Lamictal, Elavil, Klonopin  GERD -Continue Protonix  Hyperlipidemia -Continue Lipitor  Morbid obesity Body mass index is 44.25 kg/m.   DVT prophylaxis: SCDs Code Status:   Code Status: Full Code Family Communication: None at bedside Disposition Plan: Discharge tomorrow pending recommendations by GI   Consultants:   [facility]  Procedures:   UPPER GI ENDOSCOPY (09/18/2019) Impression:               - White nummular lesions in esophageal mucosa -                            likely recurrent Candida. Biopsied.                            - Pre-existing AXIOS cystgastrostomy was in place.                            Double pigtail stents were not present any further.                            Pancreatic necrosis was noted. Necrosectomy was                            performed. At end of procedure improvement noted in                            the entire cyst cavity with little amount of                            necrosis still present. 7 Fr x 7 cm double pigtail                            stent placed to continue to allow acid to enter                            into the cavity and clean it.                           -  No gross lesions in the duodenal bulb, in the                            first portion of the duodenum and in the second                            portion of the duodenum.  Recommendation:           - The patient will be observed post-procedure,                            until all discharge criteria are met.                           - Return patient to hospital ward for ongoing care.                           - Advance diet as tolerated.                           - Would plan to continue Antibiotics for 1 week                            more. OK to transition at discharge to Augmentin                            875 mg BID.                           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                           - Recommend KUB 2-view to see whether pigtail                            stents have already migrated through the body -                            suspect so.                           - Plan for repeat CT-Abdomen Pelvis in 2-3 weeks to                            ensure further improvement in the cyst/necroma                            region. Then plan to repeat EGD for stent pull in                            next 4-6 weeks.                           -  Hold on restarting PPI until repeat CT  scan is                            performed.                           - The findings and recommendations were discussed                            with the patient.                           - The findings and recommendations were discussed                            with the patient's family.  Antimicrobials:  Augmentin  Diflucan    Subjective: Some epigastric pain. No other issues.  Objective: Vitals:   09/18/19 1100 09/18/19 1115 09/18/19 1506 09/18/19 1523  BP: 135/84 135/90 119/75 (!) 101/54  Pulse: 95 92 98 92  Resp: 16 (!) 23  20  Temp:   (!) 100.9 F (38.3 C) 99.9 F (37.7 C)  TempSrc:   Oral Oral  SpO2: 99% 97% 99% 100%  Weight:      Height:        Intake/Output Summary (Last 24 hours) at 09/18/2019 1619 Last data filed at 09/18/2019 1400 Gross per 24 hour  Intake 3590.68 ml  Output --  Net 3590.68 ml   Filed Weights   09/14/19 2227 09/18/19 0815  Weight: 132 kg 132 kg    Examination:  General exam: Appears calm and comfortable Respiratory system: Clear to auscultation. Respiratory effort normal. Cardiovascular system: S1 & S2 heard, RRR. No murmurs, rubs, gallops or clicks. Gastrointestinal system: Abdomen is nondistended, soft and tender in epigastrium. No organomegaly or masses felt. Normal bowel sounds heard. Central nervous system: Alert and oriented. No focal neurological deficits. Extremities: No edema. No calf tenderness Skin: No cyanosis. No rashes Psychiatry: Judgement and insight appear normal. Mood & affect appropriate.     Data Reviewed: I have personally reviewed following labs and imaging studies  CBC: Recent Labs  Lab 09/14/19 2328 09/15/19 1948 09/16/19 0515 09/17/19 0515 09/18/19 0507  WBC 6.7 8.0 7.2 6.8 6.2  NEUTROABS 3.9  --   --  3.5 3.1  HGB 9.9* 9.9* 9.4* 9.0* 9.5*  HCT 32.5* 34.0* 32.7* 31.3* 32.9*  MCV 71.0* 74.6* 74.8* 74.0* 75.8*  PLT 370 346 378 313 096   Basic Metabolic Panel: Recent Labs  Lab  09/15/19 1030 09/15/19 1030 09/15/19 1522 09/15/19 1948 09/16/19 0515 09/17/19 0515 09/18/19 0507  NA 131*  --  132*  --  134* 133* 134*  K 3.1*  --  3.3*  --  3.7 3.7 4.1  CL 98  --  99  --  101 100 101  CO2 25  --  22  --  24 24 24   GLUCOSE 227*  --  210*  --  141* 143* 221*  BUN 6  --  6  --  7 6 <5*  CREATININE 0.68   < > 0.71 0.73 0.72 0.75 0.78  CALCIUM 8.6*  --  8.2*  --  8.7* 8.4* 8.3*  MG  --   --   --   --   --  1.6* 1.9  PHOS  --   --   --   --   --  3.7 4.3   < > = values in this interval not displayed.   GFR: Estimated Creatinine Clearance: 118.3 mL/min (by C-G formula based on SCr of 0.78 mg/dL). Liver Function Tests: Recent Labs  Lab 09/14/19 2328 09/16/19 0515 09/17/19 0515 09/18/19 0507  AST 14* 18 21 15   ALT 9 9 11 9   ALKPHOS 96 79 77 74  BILITOT 0.5 0.2* 0.3 0.5  PROT 7.7 6.5 6.5 6.2*  ALBUMIN 3.0* 2.4* 2.2* 2.3*   Recent Labs  Lab 09/14/19 2328  LIPASE 14   No results for input(s): AMMONIA in the last 168 hours. Coagulation Profile: No results for input(s): INR, PROTIME in the last 168 hours. Cardiac Enzymes: No results for input(s): CKTOTAL, CKMB, CKMBINDEX, TROPONINI in the last 168 hours. BNP (last 3 results) No results for input(s): PROBNP in the last 8760 hours. HbA1C: Recent Labs    09/17/19 0515  HGBA1C 13.0*   CBG: Recent Labs  Lab 09/17/19 1203 09/17/19 1631 09/17/19 2118 09/18/19 0719 09/18/19 1146  GLUCAP 137* 290* 259* 230* 303*   Lipid Profile: No results for input(s): CHOL, HDL, LDLCALC, TRIG, CHOLHDL, LDLDIRECT in the last 72 hours. Thyroid Function Tests: No results for input(s): TSH, T4TOTAL, FREET4, T3FREE, THYROIDAB in the last 72 hours. Anemia Panel: No results for input(s): VITAMINB12, FOLATE, FERRITIN, TIBC, IRON, RETICCTPCT in the last 72 hours. Sepsis Labs: No results for input(s): PROCALCITON, LATICACIDVEN in the last 168 hours.  Recent Results (from the past 240 hour(s))  SARS CORONAVIRUS 2 (TAT  6-24 HRS) Nasopharyngeal Nasopharyngeal Swab     Status: None   Collection Time: 09/14/19 11:58 PM   Specimen: Nasopharyngeal Swab  Result Value Ref Range Status   SARS Coronavirus 2 NEGATIVE NEGATIVE Final    Comment: (NOTE) SARS-CoV-2 target nucleic acids are NOT DETECTED. The SARS-CoV-2 RNA is generally detectable in upper and lower respiratory specimens during the acute phase of infection. Negative results do not preclude SARS-CoV-2 infection, do not rule out co-infections with other pathogens, and should not be used as the sole basis for treatment or other patient management decisions. Negative results must be combined with clinical observations, patient history, and epidemiological information. The expected result is Negative. Fact Sheet for Patients: SugarRoll.be Fact Sheet for Healthcare Providers: https://www.woods-mathews.com/ This test is not yet approved or cleared by the [place] FDA and  has been authorized for detection and/or diagnosis of SARS-CoV-2 by FDA under an Emergency Use Authorization (EUA). This EUA will remain  in effect (meaning this test can be used) for the duration of the COVID-19 declaration under Section 56 4(b)(1) of the Act, 21 U.S.C. section 360bbb-3(b)(1), unless the authorization is terminated or revoked sooner. Performed at [facility], [place] [address] [place], [place] [address]          Radiology Studies: No results found.      Scheduled Meds: . amitriptyline  75 mg Oral QHS  . amLODipine  5 mg Oral Daily  . amoxicillin-clavulanate  1 tablet Oral Q12H  . atorvastatin  40 mg Oral Daily  . clonazePAM  0.5 mg Oral Daily  . feeding supplement  1 Container Oral TID BM  . [START ON 09/19/2019] fluconazole  200 mg Oral Daily  . insulin aspart  0-20 Units Subcutaneous TID WC  . insulin aspart  0-5 Units Subcutaneous QHS  . insulin aspart  4 Units Subcutaneous TID  WC  . lamoTRIgine   200 mg Oral Daily  . losartan  25 mg Oral Daily  . Lurasidone HCl  1 tablet Oral QHS  . pantoprazole (PROTONIX) IV  40 mg Intravenous Q12H  . polyethylene glycol  17 g Oral BID  . propranolol  20 mg Oral BID  . senna  1 tablet Oral Daily  . traZODone  100 mg Oral QHS   Continuous Infusions: . lactated ringers 75 mL/hr at 09/18/19 1200     LOS: 1 day     Cordelia Poche, MD [facility] 09/18/2019, 4:19 PM  If 7PM-7AM, please contact night-coverage [email or web address]

## 2019-09-18 NOTE — Interval H&P Note (Signed)
History and Physical Interval Note:  09/18/2019 9:09 AM  Monique Holland  has presented today for surgery, with the diagnosis of necrotizing pancreatitis.  The various methods of treatment have been discussed with the patient and family. After consideration of risks, benefits and other options for treatment, the patient has consented to  Procedure(s): ESOPHAGOGASTRODUODENOSCOPY (EGD) WITH PROPOFOL (N/A) ENDOROTOR as a surgical intervention.  The patient's history has been reviewed, patient examined, no change in status, stable for surgery.  I have reviewed the patient's chart and labs.  Questions were answered to the patient's satisfaction.     [employer]

## 2019-09-19 ENCOUNTER — Other Ambulatory Visit: Payer: Self-pay

## 2019-09-19 ENCOUNTER — Encounter: Payer: Self-pay | Admitting: *Deleted

## 2019-09-19 DIAGNOSIS — K863 Pseudocyst of pancreas: Secondary | ICD-10-CM | POA: Diagnosis not present

## 2019-09-19 DIAGNOSIS — K8591 Acute pancreatitis with uninfected necrosis, unspecified: Secondary | ICD-10-CM

## 2019-09-19 DIAGNOSIS — R1013 Epigastric pain: Secondary | ICD-10-CM

## 2019-09-19 LAB — COMPREHENSIVE METABOLIC PANEL
ALT: 8 U/L (ref 0–44)
AST: 12 U/L — ABNORMAL LOW (ref 15–41)
Albumin: 2.4 g/dL — ABNORMAL LOW (ref 3.5–5.0)
Alkaline Phosphatase: 75 U/L (ref 38–126)
Anion gap: 9 (ref 5–15)
BUN: <5 mg/dL — ABNORMAL LOW (ref 6–20)
CO2: 26 mmol/L (ref 22–32)
Calcium: 8.2 mg/dL — ABNORMAL LOW (ref 8.9–10.3)
Chloride: 102 mmol/L (ref 98–111)
Creatinine, Ser: 0.87 mg/dL (ref 0.44–1.00)
GFR calc Af Amer: >60 mL/min (ref >60)
GFR calc non Af Amer: >60 mL/min (ref >60)
Glucose, Bld: 240 mg/dL — ABNORMAL HIGH (ref 70–99)
Potassium: 3.9 mmol/L (ref 3.5–5.1)
Sodium: 137 mmol/L (ref 135–145)
Total Bilirubin: 0.4 mg/dL (ref 0.3–1.2)
Total Protein: 6.1 g/dL — ABNORMAL LOW (ref 6.5–8.1)

## 2019-09-19 LAB — SEDIMENTATION RATE: Sed Rate: 92 mm/hr — ABNORMAL HIGH (ref 0–22)

## 2019-09-19 LAB — GLUCOSE, CAPILLARY
Glucose-Capillary: 261 mg/dL — ABNORMAL HIGH (ref 70–99)
Glucose-Capillary: 246 mg/dL — ABNORMAL HIGH (ref 70–99)

## 2019-09-19 LAB — CBC
HCT: 31.1 % — ABNORMAL LOW (ref 36.0–46.0)
Hemoglobin: 8.9 g/dL — ABNORMAL LOW (ref 12.0–15.0)
MCH: 21.4 pg — ABNORMAL LOW (ref 26.0–34.0)
MCHC: 28.6 g/dL — ABNORMAL LOW (ref 30.0–36.0)
MCV: 74.8 fL — ABNORMAL LOW (ref 80.0–100.0)
Platelets: 267 10*3/uL (ref 150–400)
RBC: 4.16 MIL/uL (ref 3.87–5.11)
RDW: 15 % (ref 11.5–15.5)
WBC: 6.2 10*3/uL (ref 4.0–10.5)
nRBC: 0 % (ref 0.0–0.2)

## 2019-09-19 LAB — C-REACTIVE PROTEIN: CRP: 15.3 mg/dL — ABNORMAL HIGH (ref <1.0)

## 2019-09-19 LAB — SURGICAL PATHOLOGY

## 2019-09-19 LAB — MAGNESIUM: Magnesium: 2 mg/dL (ref 1.7–2.4)

## 2019-09-19 MED ORDER — FLUCONAZOLE 200 MG PO TABS: 200.0000 mg | ORAL_TABLET | Freq: Every day | ORAL | 0 refills | Status: DC

## 2019-09-19 MED ORDER — AMOXICILLIN-POT CLAVULANATE 875-125 MG PO TABS: 1.0000 | ORAL_TABLET | Freq: Two times a day (BID) | ORAL | 0 refills | Status: AC

## 2019-09-19 MED ORDER — POLYETHYLENE GLYCOL 3350 17 G PO PACK: 17.0000 g | PACK | Freq: Two times a day (BID) | ORAL | 0 refills | Status: DC

## 2019-09-19 NOTE — Progress Notes (Signed)
Pt discharged home today per Dr Nettey. Pt's IV site D/C'd and WDL. Pt's VSS. Pt provided with home medication list, discharge instructions and prescriptions. Verbalized understanding. Pt left floor via WC in stable condition accompanied by NT. 

## 2019-09-19 NOTE — Discharge Instructions (Signed)
Monique Holland,  You were in the hospital because of abdominal pain from your pancreas. You had a procedure to clean out the dead tissue. Please continue the antibiotics and follow-up with the GI physician.

## 2019-09-19 NOTE — Plan of Care (Signed)
  Problem: Health Behavior/Discharge Planning: Goal: Ability to manage health-related needs will improve Outcome: Completed/Met   Problem: Clinical Measurements: Goal: Will remain free from infection Outcome: Completed/Met Goal: Diagnostic test results will improve Outcome: Completed/Met   Problem: Nutrition: Goal: Adequate nutrition will be maintained Outcome: Completed/Met   Problem: Coping: Goal: Level of anxiety will decrease Outcome: Completed/Met   Problem: Elimination: Goal: Will not experience complications related to bowel motility Outcome: Completed/Met   Problem: Pain Managment: Goal: General experience of comfort will improve Outcome: Completed/Met   Problem: Safety: Goal: Ability to remain free from injury will improve Outcome: Completed/Met   Problem: Skin Integrity: Goal: Risk for impaired skin integrity will decrease Outcome: Completed/Met

## 2019-09-19 NOTE — Discharge Summary (Signed)
Physician Discharge Summary  Monique Holland ZOX:[id number] DOB: [birth year] DOA: 09/14/2019  PCP: Kristie Cowman, MD  Admit date: 09/14/2019 Discharge date: 09/19/2019  Admitted From: Home Disposition: Home  Recommendations for Outpatient Follow-up:  1. Follow up with GI 2. Please follow up on the following pending results: Biopsy results   Discharge Condition: Stable CODE STATUS: Full code Diet recommendation: Heart healthy   Brief/Interim Summary:  Admission HPI written by Vianne Bulls, MD   Chief Complaint: Abdominal pain and chest pain  HPI: Monique Holland is a 54 y.o. female with medical history significant of necrotizing pancreatitis, diabetes, hypertension, pancreatic pseudocyst, morbid obesity, bipolar disorder, obstructive sleep apnea who presented to the ER with abdominal pain rated as 8 out of 10.  Pain is constant and squeezing.  Not relieved by her home regimen.  Patient was also found to be hyperglycemic initiated on some insulin drip initially.  She has been on tramadol and oxycodone at home not controlling her pain.  CT scan shows persistent but improved changes in the pancreatic region.  Start on IV pain medicine and is being admitted for pain control.  Patient is scheduled to have a procedure by Dr. Rush Landmark on April 28.  She denied any fever or chills no melena no bright red blood per rectum..  ED Course: Temperature 99.4 blood pressure 161/91 pulse 126 respiratory of 30 oxygen sat 96% on room air.  White count 8.0 hemoglobin 9.9 and platelet 346.  Sodium 132 potassium 3.3 chloride 99 CO2 22 glucose 210 creatinine 0.70.  Chest x-ray showed no acute cardiopulmonary disease.  CT abdomen pelvis on the 20 is as above.  Patient being admitted with abdominal pain for pain management   Hospital course:  Intractable abdominal pain Necrotizing pancreatitis GI consulted. Patient's pain managed on prn Oxycodone and Dilaudid IV prn. GI performed upper endoscopy on  4/28 with necrosectomy. Patient also found to have likely recurrent esophageal candidiasis. GI recommended to start Diflucan while awaiting biopsy results and Augmentin post-procedure. Biopsys pending. Outpatient follow-up.  Constipation Miralax on discharge.  Hypokalemia Resolved.  Hypomagnesemia Resolved.  Essential hypertension Continue amlodipine, losartan, propanolol  Diabetes mellitus, type 2 Continue home regimen  Hyponatremia Mild. Stable.  History of schizophrenia Depression Continue Lurasidone, Lamictal, Elavil, Klonopin  GERD Continue Protonix  Hyperlipidemia Continue Lipitor  Morbid obesity Body mass index is 44.25 kg/m.  Discharge Diagnoses:  Principal Problem:   Intractable abdominal pain Active Problems:   Hypokalemia   Schizophrenia (HCC)   Obesity, Class III, BMI 40-49.9 (morbid obesity) ([place])   Necrotizing pancreatitis   Essential hypertension   Hyperosmolar hyperglycemic state (HHS) ([place])   History of pancreatitis    Discharge Instructions   Allergies as of 09/19/2019   No Known Allergies     Medication List    STOP taking these medications   ciprofloxacin 500 MG tablet Commonly known as: Cipro   lisinopril-hydrochlorothiazide 20-12.5 MG tablet Commonly known as: ZESTORETIC   metoprolol tartrate 25 MG tablet Commonly known as: LOPRESSOR     TAKE these medications   albuterol 108 (90 Base) MCG/ACT inhaler Commonly known as: Ventolin HFA Inhale 1-2 puffs into the lungs every 6 (six) hours as needed for wheezing or shortness of breath.   amitriptyline 75 MG tablet Commonly known as: ELAVIL Take 75 mg by mouth at bedtime.   amLODipine 5 MG tablet Commonly known as: NORVASC Take 5 mg by mouth daily.   amoxicillin-clavulanate 875-125 MG tablet Commonly known as: AUGMENTIN  Take 1 tablet by mouth every 12 (twelve) hours for 6 days.   atorvastatin 40 MG tablet Commonly known as: LIPITOR Take 40 mg by mouth  daily.   clonazePAM 0.5 MG tablet Commonly known as: KLONOPIN Take 0.5 mg by mouth daily.   fluconazole 200 MG tablet Commonly known as: DIFLUCAN Take 1 tablet (200 mg total) by mouth daily for 12 days. Start taking on: September 20, 2019   lamoTRIgine 200 MG tablet Commonly known as: LAMICTAL Take 200 mg by mouth daily.   Lantus SoloStar 100 UNIT/ML Solostar Pen Generic drug: insulin glargine Inject 15 Units into the skin at bedtime. What changed: how much to take   Latuda 60 MG Tabs Generic drug: Lurasidone HCl Take 1 tablet by mouth at bedtime.   losartan 25 MG tablet Commonly known as: COZAAR Take 25 mg by mouth daily.   ondansetron 8 MG tablet Commonly known as: ZOFRAN Take 1 tablet (8 mg total) by mouth every 8 (eight) hours as needed for nausea or vomiting.   Oxycodone HCl 10 MG Tabs Take 10 mg by mouth every 4 (four) hours as needed (pain).   pantoprazole 40 MG tablet Commonly known as: PROTONIX Take 40 mg by mouth 2 (two) times daily.   polyethylene glycol 17 g packet Commonly known as: MIRALAX / GLYCOLAX Take 17 g by mouth 2 (two) times daily.   Premarin vaginal cream Generic drug: conjugated estrogens Place 1 Applicatorful vaginally daily as needed (irritation).   propranolol 20 MG tablet Commonly known as: INDERAL Take 20 mg by mouth 2 (two) times daily.   senna 8.6 MG Tabs tablet Commonly known as: SENOKOT Take 1 tablet (8.6 mg total) by mouth 2 (two) times daily.   traZODone 100 MG tablet Commonly known as: DESYREL Take 100 mg by mouth at bedtime.   Trulicity 4.5 XI/3.3AS Sopn Generic drug: Dulaglutide Inject 4.5 mg into the skin every Wednesday.      Follow-up Information    Gerrit Heck V, DO Follow up on 10/24/2019.   Specialty: Gastroenterology Why: 10:40 am Contact information: [place] [address] [phone]          No Known Allergies  Consultations:  [facility]   Procedures/Studies: CT ANGIO ABDOMEN W &/OR WO CONTRAST  Result Date: 08/21/2019 CLINICAL DATA:  Pancreatic pseudocyst with concern for arterial flow seen on recent EUS. Concern for pseudoaneurysm. EXAM: CT ANGIOGRAPHY OF ABDOMINAL AORTA TECHNIQUE: Multidetector CT imaging of the abdomen was performed using the standard protocol during bolus administration of intravenous contrast. CONTRAST:  167m OMNIPAQUE IOHEXOL 350 MG/ML SOLN COMPARISON:  August 16, 2019 FINDINGS: VASCULAR Evaluation of the arterial vasculature it was severely limited by suboptimal contrast bolus timing. Aorta: Normal caliber aorta without aneurysm, dissection, vasculitis or significant stenosis. Celiac: Patent without evidence of aneurysm, dissection, vasculitis or significant stenosis. SMA: Patent without evidence of aneurysm, dissection, vasculitis or significant stenosis. Renals: Both renal arteries are patent without evidence of aneurysm, dissection, vasculitis, fibromuscular dysplasia or significant stenosis. IMA: Patent without evidence of aneurysm, dissection, vasculitis or significant stenosis. Veins: The portal vein is patent. The SMV is patent. The splenic vein is significantly attenuated at the level of the pancreatic body and dominant pancreatic fluid collection (axial series 5, image 49). The distal pancreatic vein is difficult to follow and is felt to be occluded given the presence of portal systemic collaterals coursing through the patient's anterior abdomen. There is no definite CT evidence for an aneurysm  or pseudoaneurysm. NON-VASCULAR Lower chest: The lung bases are clear. The heart size is normal. Hepatobiliary: There is decreased hepatic attenuation suggestive of hepatic steatosis. Normal gallbladder.There is no biliary ductal dilation. Pancreas: Again noted are multiple peripancreatic in intrahepatic fluid collections/pseudocyst. Currently, the dominant collection measures approximately 12.1 by 7 cm (previously  measuring 12.3 x 6.7 cm). The pancreatic tail collection measures approximately 4.4 by 5.4 cm (previously measuring 5.9 x 5.2 cm. Peripancreatic inflammatory changes are again noted. Pancreatic calcifications are again noted in the pancreatic head. Spleen: The spleen is enlarged measuring approximately 15.4 cm craniocaudad. This is stable in size from July 16, 2018. Adrenals/Urinary Tract: --Adrenal glands: No adrenal hemorrhage. --Right kidney/ureter: Nonobstructing right-sided nephrolithiasis is noted. --Left kidney/ureter: Nonobstructing left-sided nephrolithiasis is noted. --Urinary bladder: Outside the field of view. Stomach/Bowel: --Stomach/Duodenum: There is some wall thickening of the gastric body and fundus which is likely reactive from the nearby pancreatic collections. --Small bowel: The distal duodenum is mildly dilated without evidence for high-grade ileus or small bowel obstruction. --Colon: There is some wall thickening of the splenic flexure of the colon which is likely reactive. The remaining portions of the colon are unremarkable. --Appendix: Outside the field of view. Lymphatic: --No retroperitoneal lymphadenopathy. --No mesenteric lymphadenopathy. Other: No ascites or free air. The abdominal wall is normal. Musculoskeletal. 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 There is no definite evidence for an acute displaced fracture. IMPRESSION: 1. Evaluation of the arterial structures is significantly limited by suboptimal contrast bolus timing in the patient's habitus. Given these limitations, no pseudoaneurysm was detected. 2. 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 Portosystemic collaterals are noted. 3. The portal vein and SMV remain patent. The splenic artery is patent. 4. Stable pancreatic fluid collections as previously described. 5. Bilateral nonobstructing nephrolithiasis. 6. Wall thickening of the splenic flexure of the colon,  likely reactive. There is mild dilatation of the distal duodenum and proximal jejunum likely representing a low-grade ileus. 7. Hepatic steatosis. Electronically Signed   By: Constance Holster M.D.   On: 08/21/2019 21:47   CT ABDOMEN PELVIS W CONTRAST  Result Date: 09/10/2019 CLINICAL DATA:  Pancreatitis, persistent. Left abdomen and flank pain. EXAM: CT ABDOMEN AND PELVIS WITH CONTRAST TECHNIQUE: Multidetector CT imaging of the abdomen and pelvis was performed using the standard protocol following bolus administration of intravenous contrast. CONTRAST:  115m OMNIPAQUE IOHEXOL 300 MG/ML  SOLN COMPARISON:  CT a abdomen 08/21/2019. CT of the abdomen pelvis 08/16/2019. FINDINGS: Lower chest: Linear atelectasis or scarring is present at the left base. Lung bases are otherwise clear. The heart size is normal. No significant pleural or pericardial effusion is present. Hepatobiliary: No focal liver abnormality is seen. No gallstones, gallbladder wall thickening, or biliary dilatation. Pancreas: Pancreatic head within normal limits. Diffuse inflammatory changes are present throughout the body, similar the prior exam. Cysto gastrostomy tube is in place. The peripancreatic fluid collections have decreased in size. Increased wall thickening inflammatory changes are associated. There is gas within the more central collection. Spleen: Secondary inflammatory changes present at the inferior aspect of the spleen. Mild splenomegaly is noted. The spleen measures 13 mm cephalo caudad. Adrenals/Urinary Tract: The adrenal glands are normal bilaterally. Nonobstructing nephrolithiasis is again noted. Ureters are within normal limits. The urinary bladder is normal. Stomach/Bowel: Drainage catheter is in place in the stomach. Secondary inflammatory changes are present the duodenum. Distal small bowel is mostly collapsed without additional changes. Terminal ileum is within normal limits.  Appendix is surgically absent. The ascending  proximal transverse colon is. Diffuse inflammatory changes are present at the splenic flexure. Descending and sigmoid colon are normal. Vascular/Lymphatic: Atherosclerotic calcifications are present without aneurysm. No significant retroperitoneal adenopathy is present. Reproductive: Uterus and bilateral adnexa are unremarkable. Other: No abdominal wall hernia or abnormality. No abdominopelvic ascites. 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 Endplate changes are noted at L4-5. Fused anterior osteophytes are present the thoracic spine. No focal lytic or blastic lesions are present. Bony pelvis is within normal limits. Hips are located and within normal limits bilaterally. IMPRESSION: 1. 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 2. Wall thickening about the pseudocysts and increased inflammatory changes suggests secondary infection of the pseudocysts. 3. Gas within the pseudocysts is likely due to the drainage catheter. 4. Diffuse inflammatory changes throughout the body of the pancreas compatible with acute on chronic pancreatitis. 5. Secondary inflammatory changes of the colon at the splenic flexure and of the duodenum. 6. Nonobstructing nephrolithiasis. 7. 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 8. Aortic Atherosclerosis (ICD10-I70.0). Electronically Signed   By: San Morelle M.D.   On: 09/10/2019 05:20   DG Abd 2 Views  Result Date: 09/18/2019 CLINICAL DATA:  Check pancreatic stent placement EXAM: ABDOMEN - 2 VIEW COMPARISON:  09/14/2019 FINDINGS: Previously seen Axios cystogastrostomy stent has been removed in the interval. A new double-J ureteral stent is noted extending from the stomach into the pancreatic pseudocyst. Two previously placed pigtail catheters are identified and have migrated into the ascending colon slightly more proximal within the ascending colon when compared with the prior study.  Scattered fecal material is noted. No free air is seen. No bony abnormality is noted. IMPRESSION: New cystogastrostomy stent in satisfactory position. Previously seen Axios stent has been removed. Previously placed double pigtail stents are again noted within the ascending colon but slightly more proximal than that seen on the recent exam. Electronically Signed   By: Inez Catalina M.D.   On: 09/18/2019 16:17   DG Abdomen Acute W/Chest  Result Date: 09/14/2019 CLINICAL DATA:  Left-sided abdominal pain x3 days. EXAM: DG ABDOMEN ACUTE W/ 1V CHEST COMPARISON:  August 10, 2018 FINDINGS: There is no evidence of dilated bowel loops or free intraperitoneal air. A moderate amount of stool is seen. A radiopaque mesh like stent is seen overlying the left upper quadrant with a curvilinear radiopaque stent seen overlying the mid to upper right abdomen. No radiopaque calculi or other significant radiographic abnormality is seen. Heart size and mediastinal contours are within normal limits. A very mild amount of atelectasis is noted within the left lung base. IMPRESSION: 1. No acute cardiopulmonary disease. 2. Moderate stool burden without evidence of bowel obstruction. Electronically Signed   By: Virgina Norfolk M.D.   On: 09/14/2019 23:49   MM 3D SCREEN BREAST BILATERAL  Result Date: 09/06/2019 CLINICAL DATA:  Screening. EXAM: DIGITAL SCREENING BILATERAL MAMMOGRAM WITH TOMO AND CAD COMPARISON:  None. ACR Breast Density Category b: There are scattered areas of fibroglandular density. FINDINGS: There are no findings suspicious for malignancy. Images were processed with CAD. IMPRESSION: No mammographic evidence of malignancy. A result letter of this screening mammogram will be mailed directly to the patient. RECOMMENDATION: Screening mammogram in one year. (Code:[id number]) BI-RADS CATEGORY  1: Negative. Electronically Signed   By: Curlene Dolphin M.D.   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 Biopsied. - Pre-existing AXIOS cystgastrostomy was in place.  Double pigtail stents were not present any further.  Pancreatic necrosis was noted. Necrosectomy was  performed. At end of procedure improvement noted in  the entire cyst cavity with little amount of  necrosis still present. 7 Fr x 7 cm double pigtail  stent placed to continue to allow acid to enter  into the cavity and clean it. - No gross lesions in the duodenal bulb, in the  first portion of the duodenum and in the second  portion of the duodenum.  Recommendation: - The patient will be observed post-procedure,  until all discharge criteria are met. - Return patient to hospital ward for ongoing care. - Advance diet as tolerated. - Would plan to continue Antibiotics for 1 week  more. OK to transition at discharge to Augmentin  875 mg BID. 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 - Recommend KUB 2-view to see whether pigtail  stents have already migrated through the body -  suspect so. - Plan for repeat CT-Abdomen Pelvis in 2-3 weeks to  ensure further improvement in the cyst/necroma  region. Then plan to  repeat EGD for stent pull in  next 4-6 weeks. - Hold on restarting PPI until repeat CT scan is  performed. - The findings and recommendations were discussed  with the patient. - The findings and recommendations were discussed  with the patient's family.   Subjective: Abdominal pain is improved.  Discharge Exam: Vitals:   09/19/19 0448 09/19/19 1308  BP: 102/66 111/69  Pulse: 85 87  Resp: 20 18  Temp: 98 F (36.7 C) 98.5 F (36.9 C)  SpO2: 100% 99%   Vitals:   09/18/19 1523 09/18/19 2112 09/19/19 0448 09/19/19 1308  BP: (!) 101/54 100/66 102/66 111/69  Pulse: 92 89 85 87  Resp: 20 18 20 18   Temp: 99.9 F (37.7 C) 99.1 F (37.3 C) 98 F (36.7 C) 98.5 F (36.9 C)  TempSrc: Oral Oral Oral Oral  SpO2: 100% 96% 100% 99%  Weight:      Height:        General: Pt is alert, awake, not in acute distress Cardiovascular: RRR, S1/S2 +, no rubs, no gallops Respiratory: CTA bilaterally, no wheezing, no rhonchi Abdominal: Soft, tenderness in LUQ and epigastrium, ND, bowel sounds + Extremities: no edema, no cyanosis    The results of significant diagnostics from this hospitalization (including imaging, microbiology, ancillary and laboratory) are listed below for reference.     Microbiology: Recent Results (from the past 240 hour(s))  SARS CORONAVIRUS 2 (TAT 6-24 HRS) Nasopharyngeal Nasopharyngeal Swab     Status: None   Collection Time: 09/14/19 11:58 PM   Specimen: Nasopharyngeal Swab  Result Value Ref Range Status   SARS Coronavirus 2 NEGATIVE NEGATIVE Final    Comment: (NOTE) SARS-CoV-2 target nucleic acids are NOT DETECTED. The SARS-CoV-2 RNA is generally detectable in upper and lower respiratory specimens during the acute phase of infection. Negative results do not preclude SARS-CoV-2  infection, do not rule out co-infections with other pathogens, and should not be used as the sole basis for treatment or other patient management decisions. Negative results must be combined with clinical observations, patient history, and epidemiological information. The expected result is Negative. Fact Sheet for Patients: SugarRoll.be Fact Sheet for Healthcare Providers: https://www.woods-mathews.com/ This test is not yet approved or cleared by the [place] and  has been authorized  for detection and/or diagnosis of SARS-CoV-2 by FDA under an Emergency Use Authorization (EUA). This EUA will remain  in effect (meaning this test can be used) for the duration of the COVID-19 declaration under Section 56 4(b)(1) of the Act, 21 U.S.C. section 360bbb-3(b)(1), unless the authorization is terminated or revoked sooner. Performed at [facility], [place] [address], [place], [place] [address]      Labs: BNP (last 3 results) No results for input(s): BNP in the last 8760 hours. Basic Metabolic Panel: Recent Labs  Lab 09/15/19 1522 09/15/19 1522 09/15/19 1948 09/16/19 0515 09/17/19 0515 09/18/19 0507 09/19/19 0459  NA 132*  --   --  134* 133* 134* 137  K 3.3*  --   --  3.7 3.7 4.1 3.9  CL 99  --   --  101 100 101 102  CO2 22  --   --  24 24 24 26   GLUCOSE 210*  --   --  141* 143* 221* 240*  BUN 6  --   --  7 6 <5* <5*  CREATININE 0.71   < > 0.73 0.72 0.75 0.78 0.87  CALCIUM 8.2*  --   --  8.7* 8.4* 8.3* 8.2*  MG  --   --   --   --  1.6* 1.9 2.0  PHOS  --   --   --   --  3.7 4.3  --    < > = values in this interval not displayed.   Liver Function Tests: Recent Labs  Lab 09/14/19 2328 09/16/19 0515 09/17/19 0515 09/18/19 0507 09/19/19 0459  AST 14* 18 21 15  12*  ALT 9 9 11 9 8   ALKPHOS 96 79 77 74 75  BILITOT 0.5 0.2* 0.3 0.5 0.4  PROT 7.7 6.5 6.5 6.2* 6.1*  ALBUMIN 3.0* 2.4* 2.2* 2.3* 2.4*   Recent Labs  Lab  09/14/19 2328  LIPASE 14   No results for input(s): AMMONIA in the last 168 hours. CBC: Recent Labs  Lab 09/14/19 2328 09/14/19 2328 09/15/19 1948 09/16/19 0515 09/17/19 0515 09/18/19 0507 09/19/19 0459  WBC 6.7   < > 8.0 7.2 6.8 6.2 6.2  NEUTROABS 3.9  --   --   --  3.5 3.1  --   HGB 9.9*   < > 9.9* 9.4* 9.0* 9.5* 8.9*  HCT 32.5*   < > 34.0* 32.7* 31.3* 32.9* 31.1*  MCV 71.0*   < > 74.6* 74.8* 74.0* 75.8* 74.8*  PLT 370   < > 346 378 313 299 267   < > = values in this interval not displayed.   Cardiac Enzymes: No results for input(s): CKTOTAL, CKMB, CKMBINDEX, TROPONINI in the last 168 hours. BNP: Invalid input(s): POCBNP CBG: Recent Labs  Lab 09/18/19 1146 09/18/19 1620 09/18/19 2108 09/19/19 0749 09/19/19 1155  GLUCAP 303* 299* 266* 261* 246*   D-Dimer No results for input(s): DDIMER in the last 72 hours. Hgb A1c Recent Labs    09/17/19 0515  HGBA1C 13.0*   Lipid Profile No results for input(s): CHOL, HDL, LDLCALC, TRIG, CHOLHDL, LDLDIRECT in the last 72 hours. Thyroid function studies No results for input(s): TSH, T4TOTAL, T3FREE, THYROIDAB in the last 72 hours.  Invalid input(s): FREET3 Anemia work up No results for input(s): VITAMINB12, FOLATE, FERRITIN, TIBC, IRON, RETICCTPCT in the last 72 hours. Urinalysis    Component Value Date/Time   COLORURINE YELLOW 09/14/2019 2309   APPEARANCEUR CLEAR 09/14/2019 2309   LABSPEC <1.005 (L) 09/14/2019 2309  PHURINE 5.5 09/14/2019 2309   GLUCOSEU >=500 (A) 09/14/2019 2309   HGBUR TRACE (A) 09/14/2019 2309   BILIRUBINUR NEGATIVE 09/14/2019 2309   KETONESUR NEGATIVE 09/14/2019 2309   PROTEINUR NEGATIVE 09/14/2019 2309   UROBILINOGEN 0.2 02/12/2015 1348   NITRITE NEGATIVE 09/14/2019 2309   LEUKOCYTESUR NEGATIVE 09/14/2019 2309   Sepsis Labs Invalid input(s): PROCALCITONIN,  WBC,  LACTICIDVEN Microbiology Recent Results (from the past 240 hour(s))  SARS CORONAVIRUS 2 (TAT 6-24 HRS) Nasopharyngeal  Nasopharyngeal Swab     Status: None   Collection Time: 09/14/19 11:58 PM   Specimen: Nasopharyngeal Swab  Result Value Ref Range Status   SARS Coronavirus 2 NEGATIVE NEGATIVE Final    Comment: (NOTE) SARS-CoV-2 target nucleic acids are NOT DETECTED. The SARS-CoV-2 RNA is generally detectable in upper and lower respiratory specimens during the acute phase of infection. Negative results do not preclude SARS-CoV-2 infection, do not rule out co-infections with other pathogens, and should not be used as the sole basis for treatment or other patient management decisions. Negative results must be combined with clinical observations, patient history, and epidemiological information. The expected result is Negative. Fact Sheet for Patients: SugarRoll.be Fact Sheet for Healthcare Providers: https://www.woods-mathews.com/ This test is not yet approved or cleared by the [place] FDA and  has been authorized for detection and/or diagnosis of SARS-CoV-2 by FDA under an Emergency Use Authorization (EUA). This EUA will remain  in effect (meaning this test can be used) for the duration of the COVID-19 declaration under Section 56 4(b)(1) of the Act, 21 U.S.C. section 360bbb-3(b)(1), unless the authorization is terminated or revoked sooner. Performed at [facility], [place] [address], [place], [place] [address]      Time coordinating discharge: 35 minutes  SIGNED:   Cordelia Poche, MD [facility] 09/19/2019, 1:35 PM

## 2019-09-19 NOTE — Progress Notes (Signed)
[facility] Progress Note  CC:  Pancreatitis  Subjective:  EGD/EUS on 4/28 showed the following:  - White nummular lesions in esophageal mucosa - likely recurrent Candida. Biopsied. - Pre-existing AXIOS cystgastrostomy was in place. Double pigtail stents were not present any further. Pancreatic necrosis was noted. Necrosectomy was performed. At end of procedure improvement noted in the entire cyst cavity with little amount of necrosis still present. 7 Fr x 7 cm double pigtail stent placed to continue to allow acid to enter into the cavity and clean it. - No gross lesions in the duodenal bulb, in the first portion of the duodenum and in the second portion of the duodenum.  She says that she feels good.  Tolerating diet.  Seems a little groggy this AM.  Did not receive any pain meds, but did receive clonazepam.  X-rays yesterday show the previous pigtail stents are in the ascending colon.  Objective:  Vital signs in last 24 hours: Temp:  [98 F (36.7 C)-100.9 F (38.3 C)] 98 F (36.7 C) (04/29 0448) Pulse Rate:  [85-98] 85 (04/29 0448) Resp:  [16-23] 20 (04/29 0448) BP: (100-138)/(54-90) 102/66 (04/29 0448) SpO2:  [95 %-100 %] 100 % (04/29 0448) Last BM Date: 09/17/19 General:  Alert, Well-developed, in NAD Heart:  Regular rate and rhythm; no murmurs Pulm:  CTAB.  No increased WOB. Abdomen:  Soft, non-distended.  BS present.  Mild diffuse TTP> in upper abdomen. Extremities:  Without edema. Neurologic:  Alert and oriented x 4;  grossly normal neurologically. Psych:  Alert and cooperative. Normal mood and affect.  Intake/Output from previous day: 04/28 0701 - 04/29 0700 In: 4930.7 [P.O.:1520; I.V.:3410.7] Out: -   Lab Results: Recent Labs    09/17/19 0515 09/18/19 0507 09/19/19 0459  WBC 6.8 6.2 6.2  HGB 9.0* 9.5* 8.9*  HCT 31.3* 32.9* 31.1*  PLT 313 299 267   BMET Recent Labs    09/17/19 0515 09/18/19 0507 09/19/19 0459  NA 133* 134* 137  K 3.7  4.1 3.9  CL 100 101 102  CO2 24 24 26   GLUCOSE 143* 221* 240*  BUN 6 <5* <5*  CREATININE 0.75 0.78 0.87  CALCIUM 8.4* 8.3* 8.2*   LFT Recent Labs    09/19/19 0459  PROT 6.1*  ALBUMIN 2.4*  AST 12*  ALT 8  ALKPHOS 75  BILITOT 0.4   DG Abd 2 Views  Result Date: 09/18/2019 CLINICAL DATA:  Check pancreatic stent placement EXAM: ABDOMEN - 2 VIEW COMPARISON:  09/14/2019 FINDINGS: Previously seen Axios cystogastrostomy stent has been removed in the interval. A new double-J ureteral stent is noted extending from the stomach into the pancreatic pseudocyst. Two previously placed pigtail catheters are identified and have migrated into the ascending colon slightly more proximal within the ascending colon when compared with the prior study. Scattered fecal material is noted. No free air is seen. No bony abnormality is noted. IMPRESSION: New cystogastrostomy stent in satisfactory position. Previously seen Axios stent has been removed. Previously placed double pigtail stents are again noted within the ascending colon but slightly more proximal than that seen on the recent exam. Electronically Signed   By: 09/16/2019 M.D.   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 -Ok for discharge from GI standpoint.  Should be discharged on Augmentin BID to be continued for one more week. -Continue fluconazole 200 mg daily for possible esophageal candidiasis until we have and contact her with biopsy results. -I have arranged for OV follow-up and our office will be arranging repeat CT scan, etc. -I have advised her to take several doses of Miralax when she gets home to help to flush out the pigtail stents that are located in her  ascending colon.    LOS: 2 days   Laban Emperor. Alacia Rehmann  09/19/2019, 9:27 AM

## 2019-09-19 NOTE — Progress Notes (Signed)
Inpatient Diabetes Program Recommendations  AACE/ADA: New Consensus Statement on Inpatient Glycemic Control (2015)  Target Ranges:  Prepandial:   less than 140 mg/dL      Peak postprandial:   less than 180 mg/dL (1-2 hours)      Critically ill patients:  140 - 180 mg/dL   Lab Results  Component Value Date   GLUCAP 261 (H) 09/19/2019   HGBA1C 13.0 (H) 09/17/2019    Review of Glycemic Control  CBGs above goal of < 180 mg/dL. Orders: Novolog 0-20 units tidwc and hs + 4 units tidwc  Inpatient Diabetes Program Recommendations:     Consider adding Lantus 12 units QHS  Will continue to follow.  Thank you. Ailene Ards, RD, LDN, CDE Inpatient Diabetes Coordinator [phone]

## 2019-09-20 ENCOUNTER — Encounter: Payer: Self-pay | Admitting: Gastroenterology

## 2019-09-23 ENCOUNTER — Other Ambulatory Visit: Payer: Self-pay

## 2019-09-23 DIAGNOSIS — K8591 Acute pancreatitis with uninfected necrosis, unspecified: Secondary | ICD-10-CM

## 2019-09-24 ENCOUNTER — Encounter (HOSPITAL_BASED_OUTPATIENT_CLINIC_OR_DEPARTMENT_OTHER): Payer: Self-pay | Admitting: Emergency Medicine

## 2019-09-24 ENCOUNTER — Other Ambulatory Visit: Payer: Self-pay

## 2019-09-24 ENCOUNTER — Emergency Department (HOSPITAL_BASED_OUTPATIENT_CLINIC_OR_DEPARTMENT_OTHER)
Admission: EM | Admit: 2019-09-24 | Discharge: 2019-09-24 | Disposition: A | Discharge status: Home / Self Care | Payer: Medicare Other | Attending: Emergency Medicine | Admitting: Emergency Medicine

## 2019-09-24 DIAGNOSIS — I10 Essential (primary) hypertension: Secondary | ICD-10-CM | POA: Insufficient documentation

## 2019-09-24 DIAGNOSIS — R42 Dizziness and giddiness: Secondary | ICD-10-CM

## 2019-09-24 DIAGNOSIS — E1165 Type 2 diabetes mellitus with hyperglycemia: Secondary | ICD-10-CM | POA: Diagnosis not present

## 2019-09-24 DIAGNOSIS — R739 Hyperglycemia, unspecified: Secondary | ICD-10-CM | POA: Diagnosis present

## 2019-09-24 DIAGNOSIS — Z794 Long term (current) use of insulin: Secondary | ICD-10-CM | POA: Diagnosis not present

## 2019-09-24 DIAGNOSIS — J45909 Unspecified asthma, uncomplicated: Secondary | ICD-10-CM | POA: Diagnosis not present

## 2019-09-24 DIAGNOSIS — Z79899 Other long term (current) drug therapy: Secondary | ICD-10-CM | POA: Insufficient documentation

## 2019-09-24 LAB — POCT I-STAT EG7
Acid-Base Excess: 0 mmol/L (ref 0.0–2.0)
Bicarbonate: 25.4 mmol/L (ref 20.0–28.0)
Calcium, Ion: 1.23 mmol/L (ref 1.15–1.40)
HCT: 34 % — ABNORMAL LOW (ref 36.0–46.0)
Hemoglobin: 11.6 g/dL — ABNORMAL LOW (ref 12.0–15.0)
O2 Saturation: 81 %
Potassium: 4 mmol/L (ref 3.5–5.1)
Sodium: 128 mmol/L — ABNORMAL LOW (ref 135–145)
TCO2: 27 mmol/L (ref 22–32)
pCO2, Ven: 41.4 mmHg — ABNORMAL LOW (ref 44.0–60.0)
pH, Ven: 7.396 (ref 7.250–7.430)
pO2, Ven: 45 mmHg (ref 32.0–45.0)

## 2019-09-24 LAB — URINALYSIS, ROUTINE W REFLEX MICROSCOPIC
Bilirubin Urine: NEGATIVE
Glucose, UA: >=500 mg/dL — AB
Hgb urine dipstick: NEGATIVE
Ketones, ur: NEGATIVE mg/dL
Leukocytes,Ua: NEGATIVE
Nitrite: NEGATIVE
Protein, ur: NEGATIVE mg/dL
Specific Gravity, Urine: 1.015 (ref 1.005–1.030)
pH: 7.5 (ref 5.0–8.0)

## 2019-09-24 LAB — CBC WITH DIFFERENTIAL/PLATELET
Abs Immature Granulocytes: 0.06 10*3/uL (ref 0.00–0.07)
Basophils Absolute: 0 10*3/uL (ref 0.0–0.1)
Basophils Relative: 1 %
Eosinophils Absolute: 0.1 10*3/uL (ref 0.0–0.5)
Eosinophils Relative: 1 %
HCT: 32.2 % — ABNORMAL LOW (ref 36.0–46.0)
Hemoglobin: 9.6 g/dL — ABNORMAL LOW (ref 12.0–15.0)
Immature Granulocytes: 1 %
Lymphocytes Relative: 29 %
Lymphs Abs: 1.9 10*3/uL (ref 0.7–4.0)
MCH: 21.4 pg — ABNORMAL LOW (ref 26.0–34.0)
MCHC: 29.8 g/dL — ABNORMAL LOW (ref 30.0–36.0)
MCV: 71.9 fL — ABNORMAL LOW (ref 80.0–100.0)
Monocytes Absolute: 0.6 10*3/uL (ref 0.1–1.0)
Monocytes Relative: 9 %
Neutro Abs: 3.9 10*3/uL (ref 1.7–7.7)
Neutrophils Relative %: 59 %
Platelets: 323 10*3/uL (ref 150–400)
RBC: 4.48 MIL/uL (ref 3.87–5.11)
RDW: 14.8 % (ref 11.5–15.5)
WBC: 6.5 10*3/uL (ref 4.0–10.5)
nRBC: 0 % (ref 0.0–0.2)

## 2019-09-24 LAB — LIPASE, BLOOD: Lipase: 18 U/L (ref 11–51)

## 2019-09-24 LAB — COMPREHENSIVE METABOLIC PANEL
ALT: 8 U/L (ref 0–44)
AST: 19 U/L (ref 15–41)
Albumin: 2.7 g/dL — ABNORMAL LOW (ref 3.5–5.0)
Alkaline Phosphatase: 78 U/L (ref 38–126)
Anion gap: 13 (ref 5–15)
BUN: 7 mg/dL (ref 6–20)
CO2: 23 mmol/L (ref 22–32)
Calcium: 8.7 mg/dL — ABNORMAL LOW (ref 8.9–10.3)
Chloride: 91 mmol/L — ABNORMAL LOW (ref 98–111)
Creatinine, Ser: 0.8 mg/dL (ref 0.44–1.00)
GFR calc Af Amer: >60 mL/min (ref >60)
GFR calc non Af Amer: >60 mL/min (ref >60)
Glucose, Bld: 578 mg/dL (ref 70–99)
Potassium: 4.1 mmol/L (ref 3.5–5.1)
Sodium: 127 mmol/L — ABNORMAL LOW (ref 135–145)
Total Bilirubin: 0.3 mg/dL (ref 0.3–1.2)
Total Protein: 7.1 g/dL (ref 6.5–8.1)

## 2019-09-24 LAB — URINALYSIS, MICROSCOPIC (REFLEX): WBC, UA: NONE SEEN WBC/hpf (ref 0–5)

## 2019-09-24 LAB — CBG MONITORING, ED
Glucose-Capillary: 575 mg/dL (ref 70–99)
Glucose-Capillary: 446 mg/dL — ABNORMAL HIGH (ref 70–99)
Glucose-Capillary: 349 mg/dL — ABNORMAL HIGH (ref 70–99)
Glucose-Capillary: 257 mg/dL — ABNORMAL HIGH (ref 70–99)

## 2019-09-24 MED ORDER — SODIUM CHLORIDE 0.9 % IV BOLUS
1000.0000 mL | Freq: Once | INTRAVENOUS | Status: AC
  Administered 2019-09-24: 13:00:00 1000 mL via INTRAVENOUS

## 2019-09-24 MED ORDER — INSULIN ASPART PROT & ASPART (70-30 MIX) 100 UNIT/ML ~~LOC~~ SUSP
10.0000 [IU] | Freq: Once | SUBCUTANEOUS | Status: AC
  Administered 2019-09-24: 15:00:00 10 [IU] via SUBCUTANEOUS
  Filled 2019-09-24: qty 10

## 2019-09-24 MED ORDER — SODIUM CHLORIDE 0.9 % IV BOLUS
1000.0000 mL | Freq: Once | INTRAVENOUS | Status: AC
  Administered 2019-09-24: 1000 mL via INTRAVENOUS

## 2019-09-24 MED ORDER — INSULIN ASPART PROT & ASPART (70-30 MIX) 100 UNIT/ML ~~LOC~~ SUSP
8.0000 [IU] | Freq: Once | SUBCUTANEOUS | Status: AC
  Administered 2019-09-24: 13:00:00 8 [IU] via SUBCUTANEOUS
  Filled 2019-09-24: qty 10

## 2019-09-24 MED ORDER — SODIUM CHLORIDE 0.9 % IV BOLUS
1000.0000 mL | Freq: Once | INTRAVENOUS | Status: AC
  Administered 2019-09-24: 18:00:00 1000 mL via INTRAVENOUS

## 2019-09-24 NOTE — ED Triage Notes (Signed)
Pt reports being sent to ED by PCP r/t hyperglycemia, 587 reported by pt. Pt also c/o abdominal pain, weakness and dizziness. Pt ambulatory. Lantus last night, 30mg . Pt denies insulin pta

## 2019-09-24 NOTE — ED Provider Notes (Signed)
[facility] EMERGENCY DEPARTMENT Provider Note   CSN: [id number] Arrival date & time: 09/24/19  1222     History Chief Complaint  Patient presents with  . Hyperglycemia    Monique Holland is a 54 y.o. female.  She was diagnosed with diabetes this year.  She went to her primary care doctor today complaining of nausea vomiting feeling weak and dizzy.  Found to have an elevated blood sugar.  Does not check her blood sugar at home.  Uses Lantus at bedtime.  No change in her dose.  No recent illness. Abdominal pain, chronic. Recent admission for same.   The history is provided by the patient.  Hyperglycemia Blood sugar level PTA:  500s Severity:  Moderate Onset quality:  Unable to specify Timing:  Unable to specify Progression:  Unchanged Diabetes status:  Controlled with insulin Current diabetic therapy:  Lantus Context: not change in medication   Relieved by:  Nothing Ineffective treatments:  None tried Associated symptoms: abdominal pain, dizziness, fatigue, nausea and vomiting   Associated symptoms: no chest pain, no dysuria, no fever, no shortness of breath and no syncope        Past Medical History:  Diagnosis Date  . Anxiety   . Asthma   . Bipolar disorder (HCC)   . Depression   . Diabetes mellitus without complication (HCC)   . Frequency of urination   . GERD (gastroesophageal reflux disease)   . History of asthma    last episode yrs ago  . Hypertension   . OSA (obstructive sleep apnea)    pt had study done oct 2014--  schedule for cpap titrate after kidney stone surgery  . Pancreatitis   . Pseudocyst of pancreas   . Schizophrenia (HCC)   . Ureteral calculi    BILATERAL  . Wears glasses     Patient Active Problem List   Diagnosis Date Noted  . History of pancreatitis   . Intractable abdominal pain 09/15/2019  . Hyperosmolar hyperglycemic state (HHS) (HCC) 09/15/2019  . Non-intractable vomiting   . Hematemesis with nausea   . Abdominal pain  08/03/2019  . Essential hypertension 08/03/2019  . Pancreatic pseudocyst   . Nausea and vomiting in adult   . Necrotizing pancreatitis   . Gastritis and gastroduodenitis   . Candida esophagitis (HCC)   . Acute pancreatitis 07/17/2019  . Schizophrenia (HCC) 09/02/2018  . Obesity, Class III, BMI 40-49.9 (morbid obesity) (HCC) 09/02/2018  . Iron deficiency 09/02/2018  . Symptomatic anemia 09/01/2018  . Hypokalemia 09/01/2018  . Severe sepsis (HCC) 08/11/2018  . AKI (acute kidney injury) (HCC) 08/11/2018  . Hypotension 08/11/2018  . Type 2 diabetes mellitus (HCC) 08/11/2018  . Acute appendicitis 05/22/2014  . Ureteral calculus, right 05/02/2013    Past Surgical History:  Procedure Laterality Date  . BALLOON DILATION N/A 08/23/2019   Procedure: BALLOON DILATION;  Surgeon: Meridee Score Netty Starring., MD;  Location: [facility];  Service: Gastroenterology;  Laterality: N/A;  . BIOPSY  07/18/2019   Procedure: BIOPSY;  Surgeon: Shellia Cleverly, DO;  Location: [facility];  Service: Gastroenterology;;  . BIOPSY  08/23/2019   Procedure: BIOPSY;  Surgeon: Lemar Lofty., MD;  Location: [facility];  Service: Gastroenterology;;  . BIOPSY  09/18/2019   Procedure: BIOPSY;  Surgeon: Lemar Lofty., MD;  Location: [facility];  Service: Gastroenterology;;  . CESAREAN SECTION  1991  &  2002   w/ bilateral tubal ligation in 2002  . CYSTOSCOPY W/ URETERAL STENT PLACEMENT Right  08/10/2018   Procedure: CYSTOSCOPY WITH RETROGRADE PYELOGRAM/URETERAL STENT PLACEMENT;  Surgeon: Jerilee Field, MD;  Location: [facility];  Service: Urology;  Laterality: Right;  . CYSTOSCOPY WITH RETROGRADE PYELOGRAM, URETEROSCOPY AND STENT PLACEMENT Bilateral 05/02/2013   Procedure: CYSTOSCOPY WITH RETROGRADE PYELOGRAM, URETEROSCOPY ;  Surgeon: Valetta Fuller, MD;  Location: [facility];  Service: Urology;  Laterality: Bilateral;  . CYSTOSCOPY WITH RETROGRADE PYELOGRAM, URETEROSCOPY AND STENT  PLACEMENT Right 08/15/2018   Procedure: CYSTOSCOPY WITH RIGHT RETROGRADE PYELOGRAM, RIGHT URETEROSCOPY WITH HOLMIUM LASER AND STENT PLACEMENT;  Surgeon: Crista Elliot, MD;  Location: [facility];  Service: Urology;  Laterality: Right;  . CYSTOSCOPY WITH STENT PLACEMENT Left 05/02/2013   Procedure: CYSTOSCOPY WITH STENT PLACEMENT;  Surgeon: Valetta Fuller, MD;  Location: [facility];  Service: Urology;  Laterality: Left;  . ENDOROTOR N/A 08/26/2019   Procedure: [id number];  Surgeon: Mansouraty, Netty Starring., MD;  Location: [facility] ENDOSCOPY;  Service: Gastroenterology;  Laterality: N/A;  . ENDOROTOR  09/05/2019   Procedure: [id number];  Surgeon: Mansouraty, Netty Starring., MD;  Location: [facility] ENDOSCOPY;  Service: Gastroenterology;;  . ESOPHAGOGASTRODUODENOSCOPY N/A 08/26/2019   Procedure: ESOPHAGOGASTRODUODENOSCOPY (EGD) with NECROSECTOMY;  Surgeon: Lemar Lofty., MD;  Location: [facility];  Service: Gastroenterology;  Laterality: N/A;  . ESOPHAGOGASTRODUODENOSCOPY (EGD) WITH PROPOFOL N/A 07/18/2019   Procedure: ESOPHAGOGASTRODUODENOSCOPY (EGD) WITH PROPOFOL;  Surgeon: Shellia Cleverly, DO;  Location: [facility];  Service: Gastroenterology;  Laterality: N/A;  . ESOPHAGOGASTRODUODENOSCOPY (EGD) WITH PROPOFOL N/A 08/18/2019   Procedure: ESOPHAGOGASTRODUODENOSCOPY (EGD) WITH PROPOFOL;  Surgeon: Hilarie Fredrickson, MD;  Location: [facility];  Service: Endoscopy;  Laterality: N/A;  . ESOPHAGOGASTRODUODENOSCOPY (EGD) WITH PROPOFOL N/A 08/21/2019   Procedure: ESOPHAGOGASTRODUODENOSCOPY (EGD) WITH PROPOFOL;  Surgeon: Meridee Score Netty Starring., MD;  Location: [facility];  Service: Gastroenterology;  Laterality: N/A;  . ESOPHAGOGASTRODUODENOSCOPY (EGD) WITH PROPOFOL N/A 08/23/2019   Procedure: ESOPHAGOGASTRODUODENOSCOPY (EGD) WITH PROPOFOL;  Surgeon: Meridee Score Netty Starring., MD;  Location: [facility];  Service: Gastroenterology;  Laterality: N/A;  . ESOPHAGOGASTRODUODENOSCOPY (EGD) WITH PROPOFOL N/A  09/05/2019   Procedure: ESOPHAGOGASTRODUODENOSCOPY (EGD) WITH PROPOFOL;  Surgeon: Meridee Score Netty Starring., MD;  Location: [facility] ENDOSCOPY;  Service: Gastroenterology;  Laterality: N/A;   With pancreatic pseudocyst necrosectomy via established cyst gastrostomy  . ESOPHAGOGASTRODUODENOSCOPY (EGD) WITH PROPOFOL N/A 09/18/2019   Procedure: ESOPHAGOGASTRODUODENOSCOPY (EGD) WITH PROPOFOL;  Surgeon: Meridee Score Netty Starring., MD;  Location: [facility];  Service: Gastroenterology;  Laterality: N/A;  . EUS  08/23/2019   Procedure: UPPER ENDOSCOPIC ULTRASOUND (EUS) LINEAR;  Surgeon: Lemar Lofty., MD;  Location: [facility];  Service: Gastroenterology;;  . FOREIGN BODY REMOVAL  09/05/2019   Procedure: FOREIGN BODY REMOVAL;  Surgeon: Lemar Lofty., MD;  Location: [facility];  Service: Gastroenterology;;  . FOREIGN BODY REMOVAL  09/18/2019   Procedure: FOREIGN BODY REMOVAL;  Surgeon: Lemar Lofty., MD;  Location: [facility];  Service: Gastroenterology;;  . HOLMIUM LASER APPLICATION Bilateral 05/02/2013   Procedure: HOLMIUM LASER APPLICATION;  Surgeon: Valetta Fuller, MD;  Location: [facility];  Service: Urology;  Laterality: Bilateral;  . LAPAROSCOPIC APPENDECTOMY N/A 05/22/2014   Procedure: APPENDECTOMY LAPAROSCOPIC;  Surgeon: Glenna Fellows, MD;  Location: [facility];  Service: General;  Laterality: N/A;  . PANCREATIC STENT PLACEMENT  08/23/2019   Procedure: PANCREATIC STENT PLACEMENT;  Surgeon: Lemar Lofty., MD;  Location: [facility];  Service: Gastroenterology;;  . PANCREATIC STENT PLACEMENT  09/05/2019   Procedure: PANCREATIC STENT PLACEMENT;  Surgeon: Lemar Lofty., MD;  Location: [facility];  Service: Gastroenterology;;  . PANCREATIC STENT PLACEMENT  09/18/2019   Procedure: PANCREATIC STENT PLACEMENT;  Surgeon: Lemar Lofty., MD;  Location: [facility];  Service: Gastroenterology;;  cyst gastrostomy double pigtail stent placement    . STENT REMOVAL  08/26/2019   Procedure: STENT REMOVAL;  Surgeon: Lemar Lofty., MD;  Location: [facility] ENDOSCOPY;  Service: Gastroenterology;;  . Francine Graven REMOVAL  09/05/2019   Procedure: STENT REMOVAL;  Surgeon: Lemar Lofty., MD;  Location: [facility] ENDOSCOPY;  Service: Gastroenterology;;  . Francine Graven REMOVAL  09/18/2019   Procedure: STENT REMOVAL;  Surgeon: Lemar Lofty., MD;  Location: [facility];  Service: Gastroenterology;;  . TONSILLECTOMY  age 53  . UPPER ESOPHAGEAL ENDOSCOPIC ULTRASOUND (EUS) N/A 08/21/2019   Procedure: UPPER ESOPHAGEAL ENDOSCOPIC ULTRASOUND (EUS);  Surgeon: Lemar Lofty., MD;  Location: [facility];  Service: Gastroenterology;  Laterality: N/A;  For cyst gastrostomy     OB History   No obstetric history on file.     Family History  Family history unknown: Yes    Social History   Tobacco Use  . Smoking status: Never Smoker  . Smokeless tobacco: Never Used  Substance Use Topics  . Alcohol use: No  . Drug use: No    Home Medications Prior to Admission medications   Medication Sig Start Date End Date Taking? Authorizing Provider  albuterol (VENTOLIN HFA) 108 (90 Base) MCG/ACT inhaler Inhale 1-2 puffs into the lungs every 6 (six) hours as needed for wheezing or shortness of breath. 07/21/19   Regalado, Belkys A, MD  amitriptyline (ELAVIL) 75 MG tablet Take 75 mg by mouth at bedtime. 08/10/19   [provider]  amLODipine (NORVASC) 5 MG tablet Take 5 mg by mouth daily.    [provider]  amoxicillin-clavulanate (AUGMENTIN) 875-125 MG tablet Take 1 tablet by mouth every 12 (twelve) hours for 6 days. 09/19/19 09/25/19  Narda Bonds, MD  atorvastatin (LIPITOR) 40 MG tablet Take 40 mg by mouth daily.    [provider]  clonazePAM (KLONOPIN) 0.5 MG tablet Take 0.5 mg by mouth daily.  05/23/19   [provider]  fluconazole (DIFLUCAN) 200 MG tablet Take 1 tablet (200 mg total) by mouth daily for 12 days.  09/20/19 10/02/19  Narda Bonds, MD  lamoTRIgine (LAMICTAL) 200 MG tablet Take 200 mg by mouth daily. 06/06/19   [provider]  LANTUS SOLOSTAR 100 UNIT/ML Solostar Pen Inject 15 Units into the skin at bedtime. Patient taking differently: Inject 40 Units into the skin at bedtime.  07/21/19   Regalado, Belkys A, MD  LATUDA 60 MG TABS Take 1 tablet by mouth at bedtime. 08/10/19   [provider]  losartan (COZAAR) 25 MG tablet Take 25 mg by mouth daily.    [provider]  ondansetron (ZOFRAN) 8 MG tablet Take 1 tablet (8 mg total) by mouth every 8 (eight) hours as needed for nausea or vomiting. 08/19/19   Regalado, Belkys A, MD  Oxycodone HCl 10 MG TABS Take 10 mg by mouth every 4 (four) hours as needed (pain).  08/12/19   [provider]  pantoprazole (PROTONIX) 40 MG tablet Take 40 mg by mouth 2 (two) times daily.    [provider]  polyethylene glycol (MIRALAX / GLYCOLAX) 17 g packet Take 17 g by mouth 2 (two) times daily. 09/19/19   Narda Bonds, MD  PREMARIN vaginal cream Place 1 Applicatorful vaginally daily as needed (irritation).  08/10/19   [provider]  propranolol (INDERAL) 20 MG tablet Take 20 mg by mouth 2 (two) times daily.    [provider]  senna (SENOKOT) 8.6 MG TABS tablet Take 1 tablet (8.6 mg total) by mouth 2 (two) times daily. Patient not taking: Reported on 09/15/2019 07/21/19   Niel Hummer A, MD  traZODone (DESYREL) 100 MG tablet Take 100 mg by mouth at bedtime.    [provider]  TRULICITY 4.5 DD/2.2GU SOPN Inject 4.5 mg into the skin every Wednesday.  07/29/19   [provider]    Allergies    Patient has no known allergies.  Review of Systems   Review of Systems  Constitutional: Positive for fatigue. Negative for fever.  HENT: Negative for sore throat.   Eyes: Negative for visual disturbance.  Respiratory: Negative for shortness of breath.   Cardiovascular: Negative for chest  pain and syncope.  Gastrointestinal: Positive for abdominal pain, nausea and vomiting.  Genitourinary: Negative for dysuria.  Musculoskeletal: Negative for neck pain.  Skin: Negative for rash.  Neurological: Positive for dizziness.    Physical Exam Updated Vital Signs BP (!) 138/94 (BP Location: Left Arm)   Pulse (!) 111   Temp 99.2 F (37.3 C) (Oral)   Resp 20   Ht 5\' 8"  (1.727 m)   Wt 128.8 kg   LMP 03/23/2014 Comment: (-) u preg?/ac  SpO2 99%   BMI 43.18 kg/m   Physical Exam Vitals and nursing note reviewed.  Constitutional:      General: She is not in acute distress.    Appearance: Normal appearance. She is well-developed. She is obese.  HENT:     Head: Normocephalic and atraumatic.  Eyes:     Conjunctiva/sclera: Conjunctivae normal.  Cardiovascular:     Rate and Rhythm: Normal rate and regular rhythm.     Heart sounds: No murmur.  Pulmonary:     Effort: Pulmonary effort is normal. No respiratory distress.     Breath sounds: Normal breath sounds.  Abdominal:     Palpations: Abdomen is soft.     Tenderness: There is no abdominal tenderness.  Musculoskeletal:        General: No deformity or signs of injury. Normal range of motion.     Cervical back: Neck supple.  Skin:    General: Skin is warm and dry.     Capillary Refill: Capillary refill takes less than 2 seconds.  Neurological:     General: No focal deficit present.     Mental Status: She is alert.     ED Results / Procedures / Treatments   Labs (all labs ordered are listed, but only abnormal results are displayed) Labs Reviewed  CBC WITH DIFFERENTIAL/PLATELET - Abnormal; Notable for the following components:      Result Value   Hemoglobin 9.6 (*)    HCT 32.2 (*)    MCV 71.9 (*)    MCH 21.4 (*)    MCHC 29.8 (*)    All other components within normal limits  COMPREHENSIVE METABOLIC PANEL - Abnormal; Notable for the following components:   Sodium 127 (*)    Chloride 91 (*)    Glucose, Bld 578 (*)     Calcium 8.7 (*)    Albumin 2.7 (*)    All other components within normal limits  CBG MONITORING, ED - Abnormal; Notable for the following components:   Glucose-Capillary 575 (*)    All other components within normal limits  POCT I-STAT EG7 - Abnormal; Notable for the following components:  pCO2, Ven 41.4 (*)    Sodium 128 (*)    HCT 34.0 (*)    Hemoglobin 11.6 (*)    All other components within normal limits  CBG MONITORING, ED - Abnormal; Notable for the following components:   Glucose-Capillary 446 (*)    All other components within normal limits  CBG MONITORING, ED - Abnormal; Notable for the following components:   Glucose-Capillary 349 (*)    All other components within normal limits  LIPASE, BLOOD  URINALYSIS, ROUTINE W REFLEX MICROSCOPIC  BLOOD GAS, VENOUS  CBG MONITORING, ED    EKG EKG Interpretation  Date/Time:  Tuesday Sep 24 2019 12:50:44 EDT Ventricular Rate:  105 PR Interval:    QRS Duration: 97 QT Interval:  360 QTC Calculation: 476 R Axis:   9 Text Interpretation: Sinus tachycardia No significant change since prior 4/21 Confirmed by Meridee ScoreButler, Monque Haggar [phone]) on 09/24/2019 1:05:00 PM Also confirmed by Meridee ScoreButler, Arius Harnois [phone]), editor Elita QuickWatlington, Beverly ([id number])  on 09/24/2019 2:13:04 PM   Radiology No results found.  Procedures Procedures (including critical care time)  Medications Ordered in ED Medications  sodium chloride 0.9 % bolus 1,000 mL (0 mLs Intravenous Stopped 09/24/19 1435)  insulin aspart protamine- aspart (NOVOLOG MIX 70/30) injection 8 Units (8 Units Subcutaneous Given 09/24/19 1329)  sodium chloride 0.9 % bolus 1,000 mL (1,000 mLs Intravenous New Bag/Given 09/24/19 1455)  insulin aspart protamine- aspart (NOVOLOG MIX 70/30) injection 10 Units (10 Units Subcutaneous Given 09/24/19 1501)    ED Course  I have reviewed the triage vital signs and the nursing notes.  Pertinent labs & imaging results that were available during my care of the patient  were reviewed by me and considered in my medical decision making (see chart for details).  Clinical Course as of Sep 24 1718  Tue Sep 24, 2019  1324 Venous pH is good so less likely to be DKA.   [MB]  1437 Chemistry showing a elevated glucose of 578 and a pseudohyponatremia with a sodium of 127.  No gap.  Lipase also normal.   [MB]    Clinical Course User Index [MB] Terrilee FilesButler, Zoiee Wimmer C, MD   MDM Rules/Calculators/A&P                     This patient complains of elevated blood sugars dizziness; this involves an extensive number of treatment Options and is a complaint that carries with it a high risk of complications and Morbidity. The differential includes hyperglycemia, DKA, metabolic derangement, anemia, infection  I ordered, reviewed and interpreted labs, which included CBC with normal white count, stable hemoglobin.  Chemistry showing a low sodium of 127 consistent with pseudohyponatremia and elevated glucose of 578.  Normal gap.  Normal pH. I ordered medication insulin and IV fluids Previous records obtained and reviewed in epic including last hospitalization for her chronic abdominal pain  After the interventions stated above, I reevaluated the patient and found patient's blood sugar to be improved.  She is hungry and asking to eat.  She was signed out to the oncoming attending Dr. Dalene SeltzerSchlossman with plan for following up on CBG and if continuing to trend down can be discharged to follow-up with her primary care doctor for further management of her poorly controlled diabetes.   Final Clinical Impression(s) / ED Diagnoses Final diagnoses:  Hyperglycemia  Dizziness    Rx / DC Orders ED Discharge Orders    None       Terrilee FilesButler, Nori Winegar C, MD 09/24/19 1723

## 2019-09-24 NOTE — Discharge Instructions (Addendum)
You were evaluated in the emergency department for elevated blood sugars.  You received some insulin and fluids with improvement in your symptoms.  Please contact your primary care doctor for close follow-up.  You you will likely need to get supplies to check your blood sugars and may need your insulin adjusted.

## 2019-09-24 NOTE — Telephone Encounter (Signed)
EGD scheduled at [facility] on 11/11/19 at 730 am with Dr Meridee Score COVID test on 11/07/19 at 910 am    CT scan scheduled for 10/02/19 at 930 am pt to pick up contrast at [facility] radiology at least 2 days prior.   EGD  scheduled, pt instructed and medications reviewed.  Patient instructions mailed to home.  Patient to call with any questions or concerns. Pt also given CT scan instructions. The pt has been advised of the information and verbalized understanding.

## 2019-09-24 NOTE — ED Notes (Signed)
Patient denies pain and is resting comfortably.  

## 2019-09-24 NOTE — ED Provider Notes (Signed)
  Physical Exam  BP (!) 132/97 (BP Location: Left Arm)   Pulse 99   Temp 99.2 F (37.3 C) (Oral)   Resp 18   Ht 5\' 8"  (1.727 m)   Wt 128.8 kg   LMP 03/23/2014 Comment: (-) u preg?/ac  SpO2 98%   BMI 43.18 kg/m   Physical Exam  ED Course/Procedures   Clinical Course as of Sep 24 2010  Tue Sep 24, 2019  1324 Venous pH is good so less likely to be DKA.   [MB]  1437 Chemistry showing a elevated glucose of 578 and a pseudohyponatremia with a sodium of 127.  No gap.  Lipase also normal.   [MB]    Clinical Course User Index [MB] Sep 26, 2019, MD    Procedures  MDM  Received care of pt from Dr. Terrilee Files.  Please see his note for prior care. Briefly this is a 54yo female with recent hospitalizations in the setting of abdominal pain related to recent hx of necrotizing pancreatitis, and diabetes with hyperglycemia who presented with hyperglycemia, lightheadedness with standing.  Glucose improving with insulin and fluids, pt asking to eat, will be rechecking glucose.   No signs of DKA, HHS, presentation not consistent with CVA.  Suspect dehydration in setting of hyperglycemia and decreased po intake related to ongoing pain and nausea leading to lightheadedness.  Patient reports generalized weakness, lightheadedness and requesting re-admission given generalized weakness. Initially called hospitalist to discuss given pt recent discharge, continuing difficulty controlling glucose at home, dehydration, continued pain but after discussion agree that outpatient management is appropriate and discussed this with patient. While in the ED, she was able to tolerate po, ambulate independently and received fluids with improvement in glucose.  I agree that management of her glucose and continuing pain is appropriate for outpatient in this setting.  Discussed reasons to return. Patient discharged in stable condition with understanding of reasons to return.       54yo, MD 09/25/19 1401

## 2019-09-26 MED FILL — Insulin Aspart Prot & Aspart (Human) Inj 100 Unit/ML (70-30): SUBCUTANEOUS | Qty: 0.1 | Status: AC

## 2019-09-26 MED FILL — Insulin Aspart Prot & Aspart (Human) Inj 100 Unit/ML (70-30): SUBCUTANEOUS | Qty: 0.08 | Status: AC

## 2019-09-29 ENCOUNTER — Inpatient Hospital Stay (HOSPITAL_COMMUNITY): Payer: Medicare Other

## 2019-09-29 ENCOUNTER — Other Ambulatory Visit: Payer: Self-pay

## 2019-09-29 ENCOUNTER — Encounter (HOSPITAL_BASED_OUTPATIENT_CLINIC_OR_DEPARTMENT_OTHER): Payer: Self-pay

## 2019-09-29 ENCOUNTER — Emergency Department (HOSPITAL_BASED_OUTPATIENT_CLINIC_OR_DEPARTMENT_OTHER): Payer: Medicare Other

## 2019-09-29 ENCOUNTER — Inpatient Hospital Stay (HOSPITAL_BASED_OUTPATIENT_CLINIC_OR_DEPARTMENT_OTHER)
Admission: EM | Admit: 2019-09-29 | Discharge: 2019-10-01 | DRG: 057 | Disposition: A | Discharge status: Home Health | Payer: Medicare Other | Attending: Neurology | Admitting: Neurology

## 2019-09-29 DIAGNOSIS — R29706 NIHSS score 6: Secondary | ICD-10-CM | POA: Diagnosis present

## 2019-09-29 DIAGNOSIS — I160 Hypertensive urgency: Secondary | ICD-10-CM | POA: Diagnosis present

## 2019-09-29 DIAGNOSIS — E1165 Type 2 diabetes mellitus with hyperglycemia: Secondary | ICD-10-CM | POA: Diagnosis present

## 2019-09-29 DIAGNOSIS — R791 Abnormal coagulation profile: Secondary | ICD-10-CM | POA: Diagnosis present

## 2019-09-29 DIAGNOSIS — E66813 Obesity, class 3: Secondary | ICD-10-CM | POA: Diagnosis present

## 2019-09-29 DIAGNOSIS — E611 Iron deficiency: Secondary | ICD-10-CM | POA: Diagnosis present

## 2019-09-29 DIAGNOSIS — I6389 Other cerebral infarction: Secondary | ICD-10-CM | POA: Diagnosis not present

## 2019-09-29 DIAGNOSIS — B3781 Candidal esophagitis: Secondary | ICD-10-CM | POA: Diagnosis present

## 2019-09-29 DIAGNOSIS — I10 Essential (primary) hypertension: Secondary | ICD-10-CM | POA: Diagnosis present

## 2019-09-29 DIAGNOSIS — Z794 Long term (current) use of insulin: Secondary | ICD-10-CM | POA: Diagnosis not present

## 2019-09-29 DIAGNOSIS — R2990 Unspecified symptoms and signs involving the nervous system: Secondary | ICD-10-CM | POA: Diagnosis not present

## 2019-09-29 DIAGNOSIS — D509 Iron deficiency anemia, unspecified: Secondary | ICD-10-CM | POA: Diagnosis present

## 2019-09-29 DIAGNOSIS — Z79899 Other long term (current) drug therapy: Secondary | ICD-10-CM | POA: Diagnosis not present

## 2019-09-29 DIAGNOSIS — G8191 Hemiplegia, unspecified affecting right dominant side: Principal | ICD-10-CM | POA: Diagnosis present

## 2019-09-29 DIAGNOSIS — E785 Hyperlipidemia, unspecified: Secondary | ICD-10-CM | POA: Diagnosis present

## 2019-09-29 DIAGNOSIS — Z9282 Status post administration of tPA (rtPA) in a different facility within the last 24 hours prior to admission to current facility: Secondary | ICD-10-CM

## 2019-09-29 DIAGNOSIS — R531 Weakness: Secondary | ICD-10-CM

## 2019-09-29 DIAGNOSIS — Z9114 Patient's other noncompliance with medication regimen: Secondary | ICD-10-CM | POA: Diagnosis not present

## 2019-09-29 DIAGNOSIS — G4733 Obstructive sleep apnea (adult) (pediatric): Secondary | ICD-10-CM | POA: Diagnosis present

## 2019-09-29 DIAGNOSIS — Z6841 Body Mass Index (BMI) 40.0 and over, adult: Secondary | ICD-10-CM | POA: Diagnosis not present

## 2019-09-29 DIAGNOSIS — J45909 Unspecified asthma, uncomplicated: Secondary | ICD-10-CM | POA: Diagnosis present

## 2019-09-29 DIAGNOSIS — K219 Gastro-esophageal reflux disease without esophagitis: Secondary | ICD-10-CM | POA: Diagnosis present

## 2019-09-29 DIAGNOSIS — T45615A Adverse effect of thrombolytic drugs, initial encounter: Secondary | ICD-10-CM | POA: Diagnosis present

## 2019-09-29 DIAGNOSIS — F209 Schizophrenia, unspecified: Secondary | ICD-10-CM | POA: Diagnosis present

## 2019-09-29 DIAGNOSIS — E876 Hypokalemia: Secondary | ICD-10-CM | POA: Diagnosis present

## 2019-09-29 DIAGNOSIS — I639 Cerebral infarction, unspecified: Secondary | ICD-10-CM | POA: Diagnosis not present

## 2019-09-29 DIAGNOSIS — Z7982 Long term (current) use of aspirin: Secondary | ICD-10-CM

## 2019-09-29 DIAGNOSIS — Z20822 Contact with and (suspected) exposure to covid-19: Secondary | ICD-10-CM | POA: Diagnosis present

## 2019-09-29 DIAGNOSIS — E871 Hypo-osmolality and hyponatremia: Secondary | ICD-10-CM | POA: Diagnosis present

## 2019-09-29 DIAGNOSIS — E041 Nontoxic single thyroid nodule: Secondary | ICD-10-CM | POA: Diagnosis present

## 2019-09-29 DIAGNOSIS — E119 Type 2 diabetes mellitus without complications: Secondary | ICD-10-CM

## 2019-09-29 DIAGNOSIS — Z9049 Acquired absence of other specified parts of digestive tract: Secondary | ICD-10-CM | POA: Diagnosis not present

## 2019-09-29 LAB — CBC
HCT: 32.8 % — ABNORMAL LOW (ref 36.0–46.0)
Hemoglobin: 9.9 g/dL — ABNORMAL LOW (ref 12.0–15.0)
MCH: 21.7 pg — ABNORMAL LOW (ref 26.0–34.0)
MCHC: 30.2 g/dL (ref 30.0–36.0)
MCV: 71.9 fL — ABNORMAL LOW (ref 80.0–100.0)
Platelets: 312 10*3/uL (ref 150–400)
RBC: 4.56 MIL/uL (ref 3.87–5.11)
RDW: 14.9 % (ref 11.5–15.5)
WBC: 7.1 10*3/uL (ref 4.0–10.5)
nRBC: 0 % (ref 0.0–0.2)

## 2019-09-29 LAB — GLUCOSE, CAPILLARY
Glucose-Capillary: 465 mg/dL — ABNORMAL HIGH (ref 70–99)
Glucose-Capillary: 329 mg/dL — ABNORMAL HIGH (ref 70–99)
Glucose-Capillary: 305 mg/dL — ABNORMAL HIGH (ref 70–99)
Glucose-Capillary: 117 mg/dL — ABNORMAL HIGH (ref 70–99)
Glucose-Capillary: 177 mg/dL — ABNORMAL HIGH (ref 70–99)
Glucose-Capillary: 222 mg/dL — ABNORMAL HIGH (ref 70–99)

## 2019-09-29 LAB — COMPREHENSIVE METABOLIC PANEL
ALT: 11 U/L (ref 0–44)
AST: 17 U/L (ref 15–41)
Albumin: 3 g/dL — ABNORMAL LOW (ref 3.5–5.0)
Alkaline Phosphatase: 85 U/L (ref 38–126)
Anion gap: 12 (ref 5–15)
BUN: 10 mg/dL (ref 6–20)
CO2: 25 mmol/L (ref 22–32)
Calcium: 8.8 mg/dL — ABNORMAL LOW (ref 8.9–10.3)
Chloride: 90 mmol/L — ABNORMAL LOW (ref 98–111)
Creatinine, Ser: 0.94 mg/dL (ref 0.44–1.00)
GFR calc Af Amer: >60 mL/min (ref >60)
GFR calc non Af Amer: >60 mL/min (ref >60)
Glucose, Bld: 595 mg/dL (ref 70–99)
Potassium: 3.8 mmol/L (ref 3.5–5.1)
Sodium: 127 mmol/L — ABNORMAL LOW (ref 135–145)
Total Bilirubin: 0.4 mg/dL (ref 0.3–1.2)
Total Protein: 7.5 g/dL (ref 6.5–8.1)

## 2019-09-29 LAB — DIFFERENTIAL
Abs Immature Granulocytes: 0.03 10*3/uL (ref 0.00–0.07)
Basophils Absolute: 0.1 10*3/uL (ref 0.0–0.1)
Basophils Relative: 1 %
Eosinophils Absolute: 0.1 10*3/uL (ref 0.0–0.5)
Eosinophils Relative: 1 %
Immature Granulocytes: 0 %
Lymphocytes Relative: 30 %
Lymphs Abs: 2.1 10*3/uL (ref 0.7–4.0)
Monocytes Absolute: 0.6 10*3/uL (ref 0.1–1.0)
Monocytes Relative: 8 %
Neutro Abs: 4.3 10*3/uL (ref 1.7–7.7)
Neutrophils Relative %: 60 %

## 2019-09-29 LAB — HEMOGLOBIN A1C
Hgb A1c MFr Bld: 14.1 % — ABNORMAL HIGH (ref 4.8–5.6)
Mean Plasma Glucose: 357.97 mg/dL

## 2019-09-29 LAB — RAPID URINE DRUG SCREEN, HOSP PERFORMED
Amphetamines: NOT DETECTED
Barbiturates: NOT DETECTED
Benzodiazepines: NOT DETECTED
Cocaine: NOT DETECTED
Opiates: NOT DETECTED
Tetrahydrocannabinol: NOT DETECTED

## 2019-09-29 LAB — PROTIME-INR
INR: 1 (ref 0.8–1.2)
Prothrombin Time: 13 seconds (ref 11.4–15.2)

## 2019-09-29 LAB — LIPID PANEL
Cholesterol: 157 mg/dL (ref 0–200)
HDL: 39 mg/dL — ABNORMAL LOW (ref >40)
LDL Cholesterol: 86 mg/dL (ref 0–99)
Total CHOL/HDL Ratio: 4 RATIO
Triglycerides: 159 mg/dL — ABNORMAL HIGH (ref <150)
VLDL: 32 mg/dL (ref 0–40)

## 2019-09-29 LAB — APTT: aPTT: 31 seconds (ref 24–36)

## 2019-09-29 LAB — RESPIRATORY PANEL BY RT PCR (FLU A&B, COVID)
Influenza A by PCR: NEGATIVE
Influenza B by PCR: NEGATIVE
SARS Coronavirus 2 by RT PCR: NEGATIVE

## 2019-09-29 LAB — ECHOCARDIOGRAM COMPLETE
Height: 68 in
Weight: 4610.26 oz

## 2019-09-29 LAB — MRSA PCR SCREENING: MRSA by PCR: NEGATIVE

## 2019-09-29 LAB — CBG MONITORING, ED: Glucose-Capillary: 562 mg/dL (ref 70–99)

## 2019-09-29 IMAGING — CT CT HEAD CODE STROKE
3 series · 14 of 47 positions shown, 16 images · non-contrast
Comparison: None.

CLINICAL DATA: Code stroke.  Initial evaluation for acute stroke.

EXAM:
CT HEAD WITHOUT CONTRAST
TECHNIQUE: Contiguous axial images were obtained from the base of the skull
through the vertex without intravenous contrast.

[Series 2: head wo · axial · 0.47mm/px · z∈[+70,+200]mm · 8 of 32 slices shown, 10 images]
[im 3/32  brain]
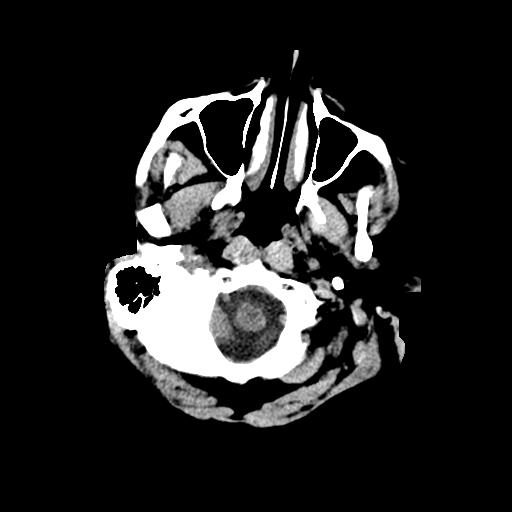
[im 3/32  bone]
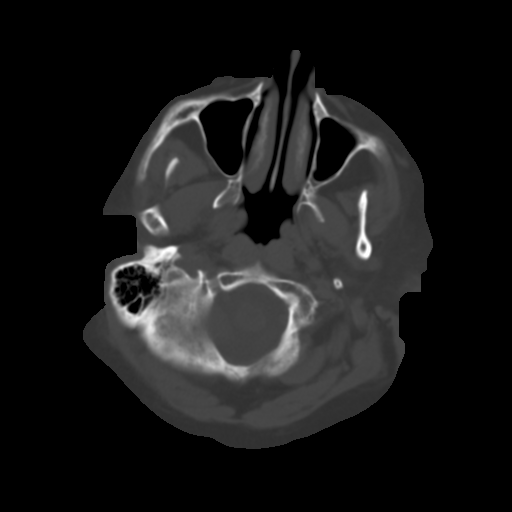
[im 7/32  brain]
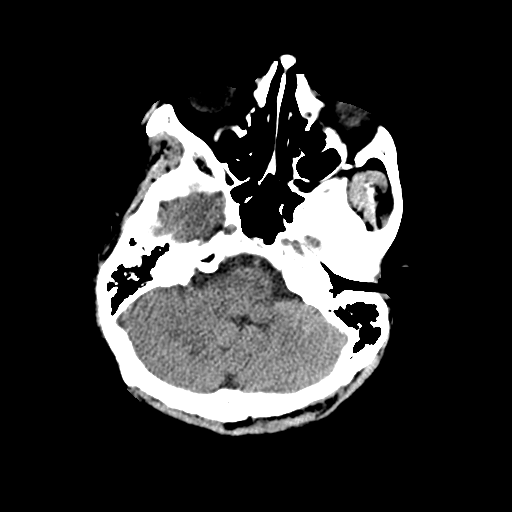
[im 10/32  brain]
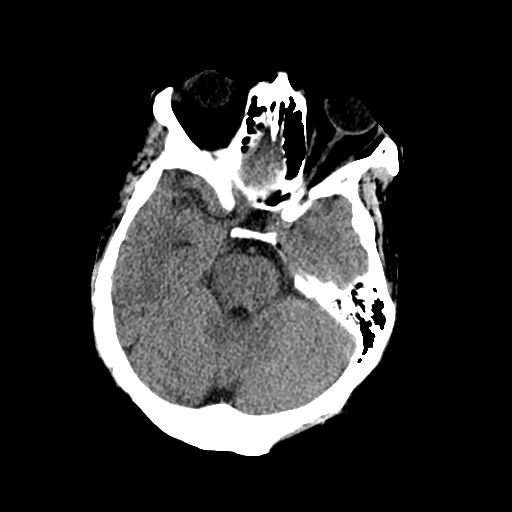
[im 14/32  brain]
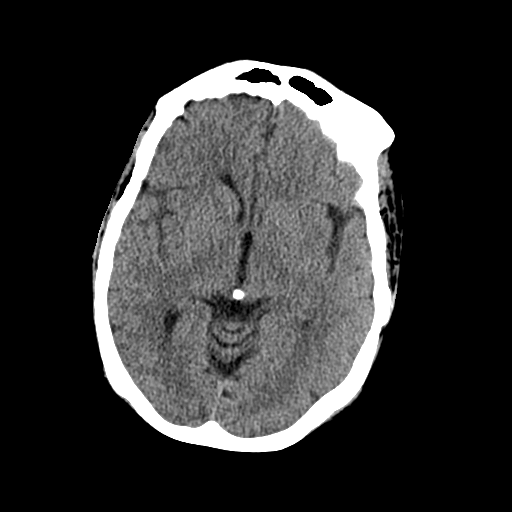
[im 18/32  brain]
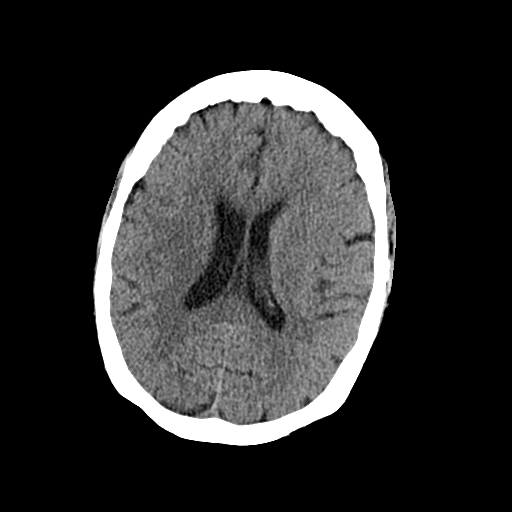
[im 18/32  bone]
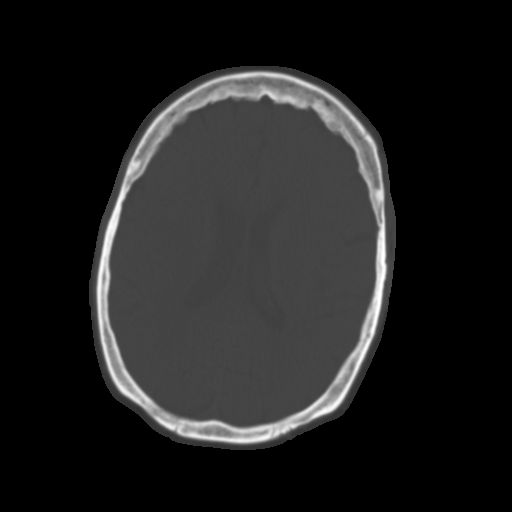
[im 22/32  brain]
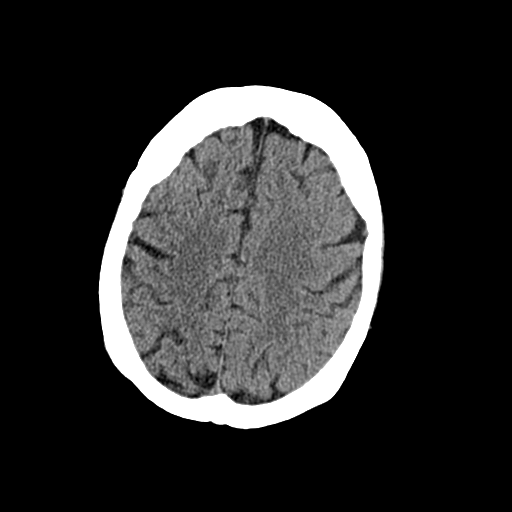
[im 25/32  brain]
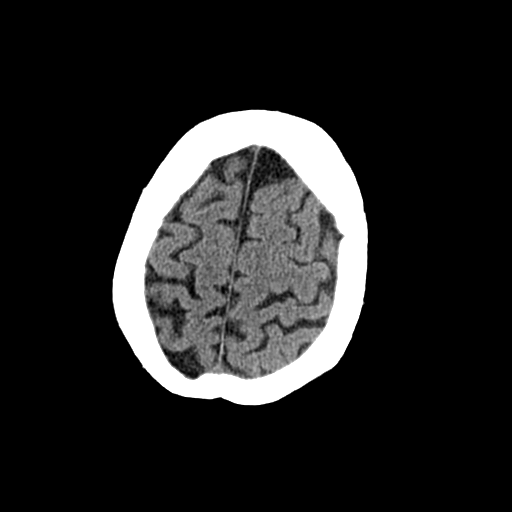
[im 29/32  brain]
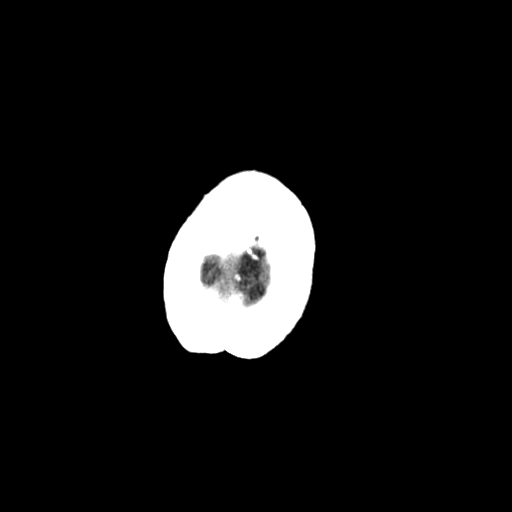

[Series 4: cor soft · coronal · 0.32mm/px · 3 of 71 slices shown]
[im 24/71  brain]
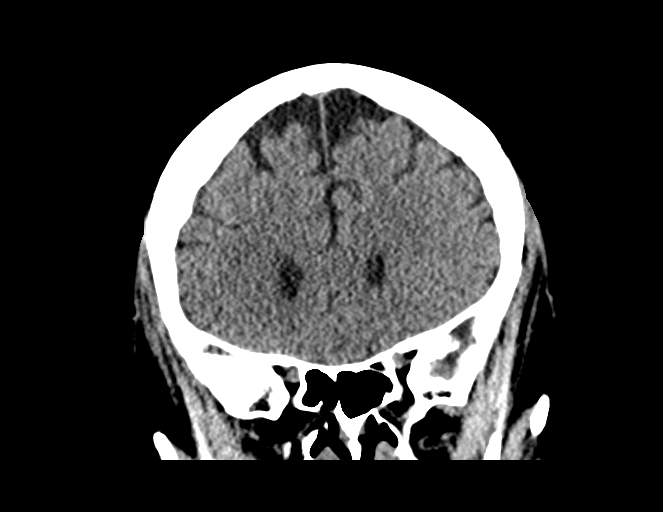
[im 32/71  brain]
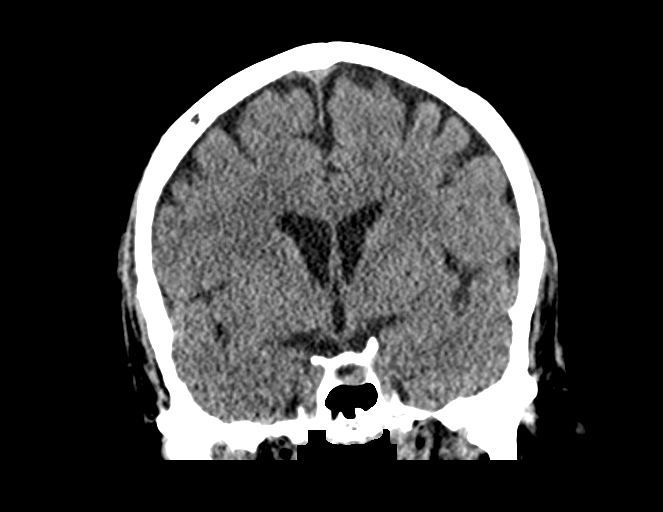
[im 39/71  brain]
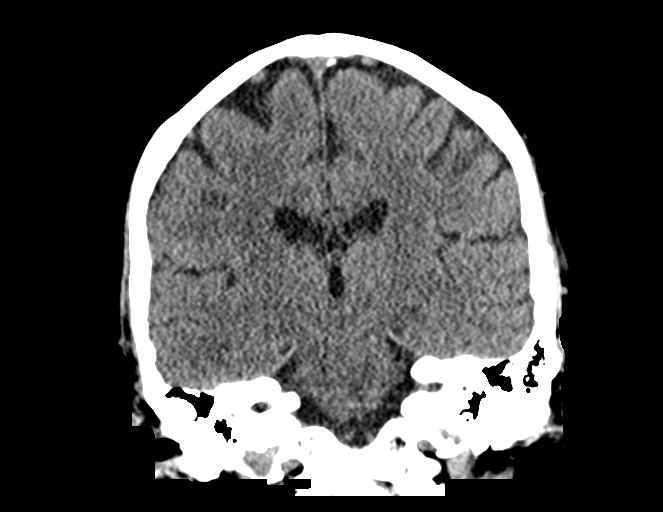

[Series 5: sag soft · sagittal · 0.30mm/px · 3 of 59 slices shown]
[im 20/59  brain]
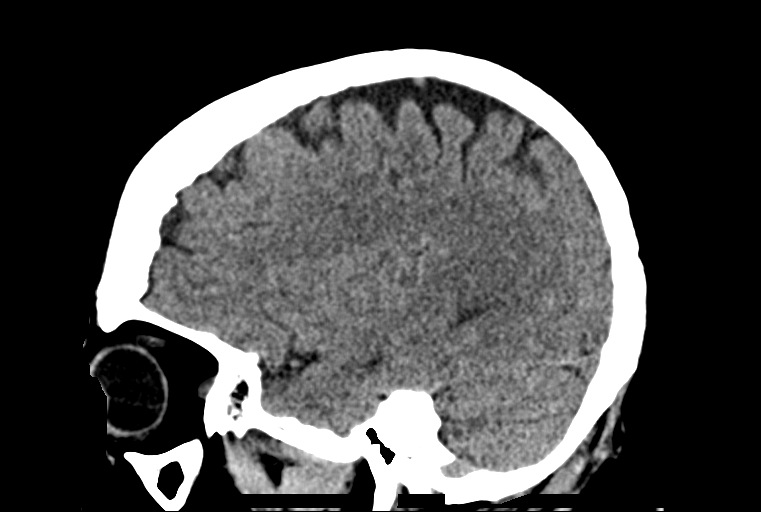
[im 30/59  brain]
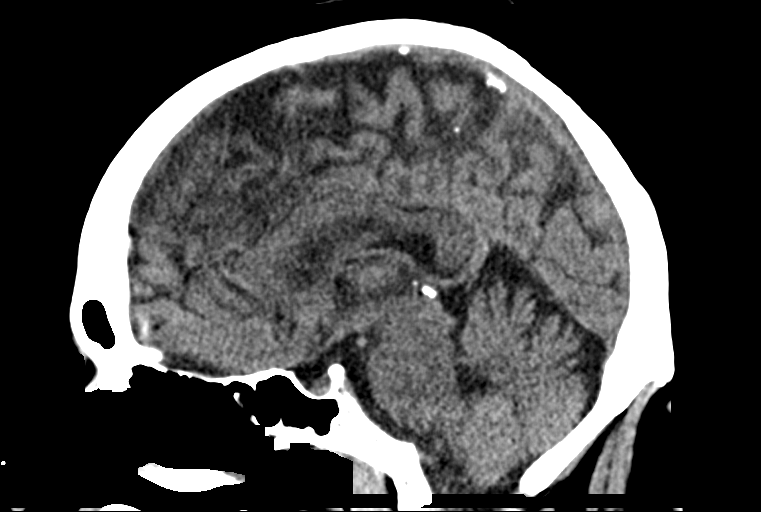
[im 39/59  brain]
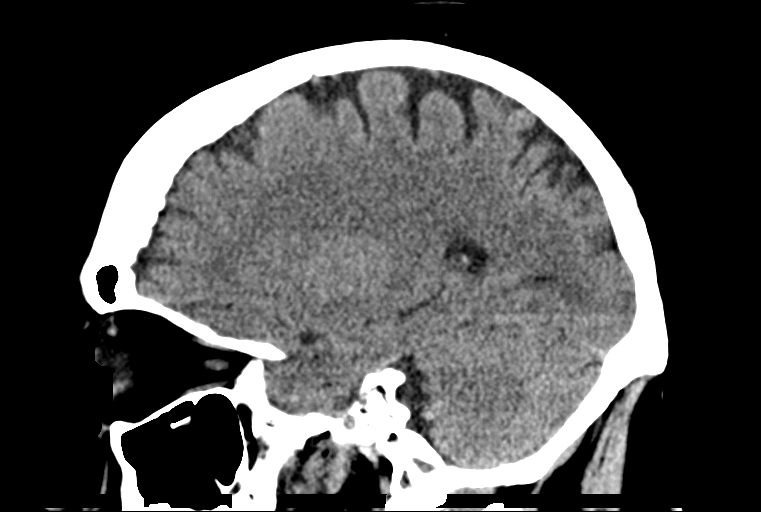

[14 of 47 positions shown; findings below may reference images not displayed]

FINDINGS: Brain: Cerebral volume within normal limits for patient age.

No evidence for acute intracranial hemorrhage. No findings to
suggest acute large vessel territory infarct. No mass lesion,
midline shift, or mass effect. Ventricles are normal in size without
evidence for hydrocephalus. No extra-axial fluid collection
identified.

Vascular: No hyperdense vessel identified.

Skull: Scalp soft tissues demonstrate no acute abnormality.
Calvarium intact.

Sinuses/Orbits: Globes and orbital soft tissues within normal
limits.

Visualized paranasal sinuses are clear. No mastoid effusion.

ASPECTS (Alberta Stroke Program Early CT Score)

- Ganglionic level infarction (caudate, lentiform nuclei, internal
capsule, insula, M1-M3 cortex):

- Supraganglionic infarction (M4-M6 cortex):

Total score (0-10 with 10 being normal):
IMPRESSION: 1. Negative head CT.  No acute intracranial abnormality.
2. ASPECTS is 10.

Critical Value/emergent results were called by telephone at the time
[facility], who verbally acknowledged these results.

## 2019-09-29 IMAGING — CT CT ANGIO NECK
1 of 8 series · 6 of 33 positions shown · IV contrast (Omnipaque)
Comparison: Prior head CT from earlier same day.

CLINICAL DATA: Initial evaluation for acute right-sided weakness.

EXAM:
CT ANGIOGRAPHY HEAD AND NECK
TECHNIQUE: Multidetector CT imaging of the head and neck was performed using
the standard protocol during bolus administration of intravenous
contrast. Multiplanar CT image reconstructions and MIPs were
obtained to evaluate the vascular anatomy. Carotid stenosis
measurements (when applicable) are obtained utilizing NASCET
criteria, using the distal internal carotid diameter as the
denominator.
CONTRAST:  100mL OMNIPAQUE IOHEXOL 350 MG/ML SOLN

[Series 6: axial thin · axial · 0.55mm/px · z∈[-336,-70]mm · 6 of 374 slices shown]
[im 54/374  soft-tissue]
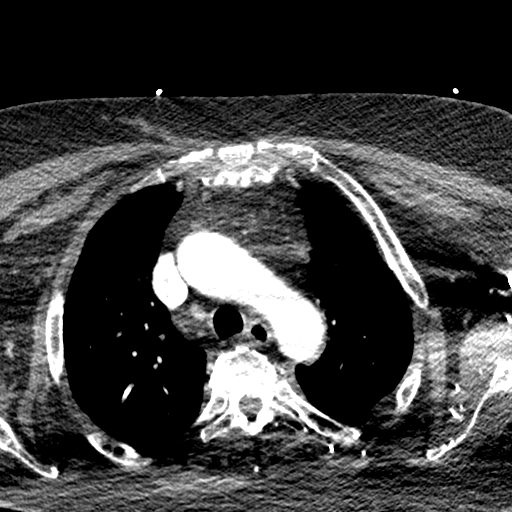
[im 107/374  bone]
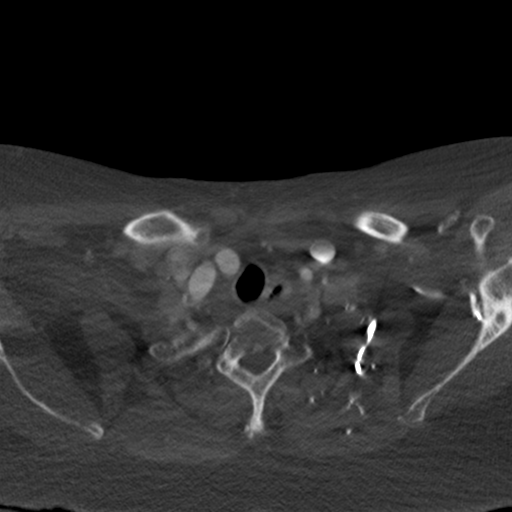
[im 160/374  soft-tissue]
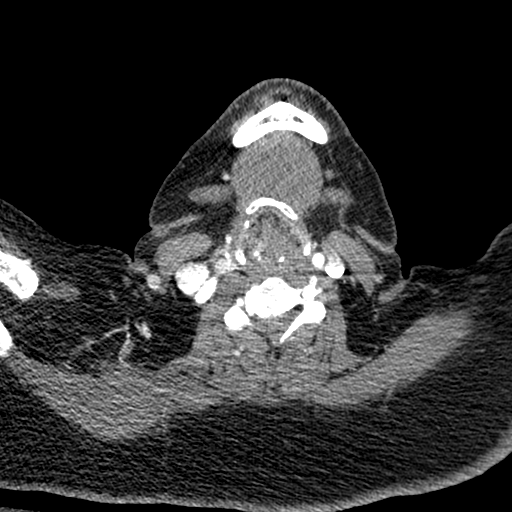
[im 214/374  bone]
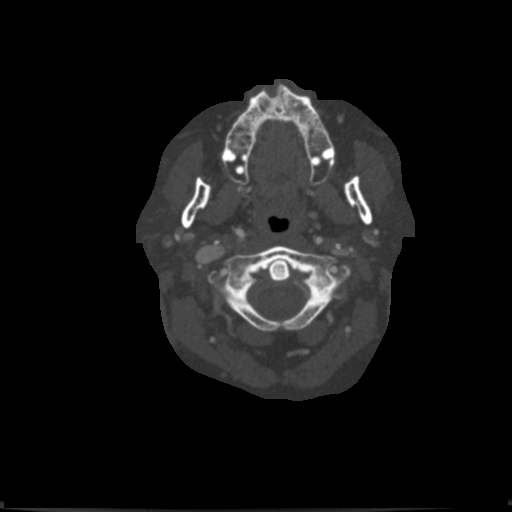
[im 267/374  soft-tissue]
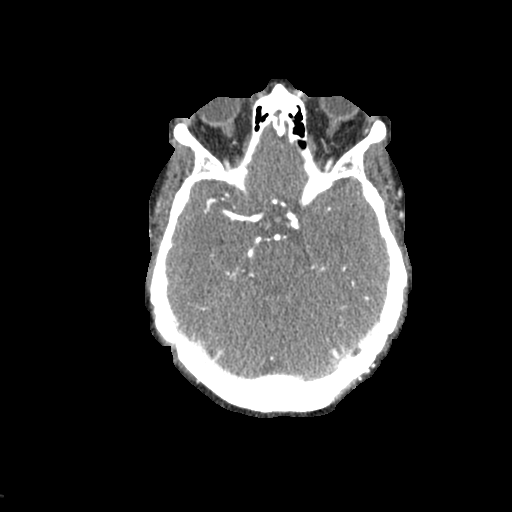
[im 320/374  bone]
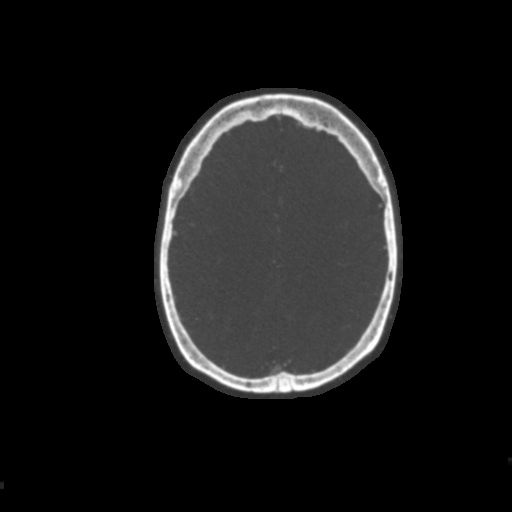

[6 of 33 positions shown; findings below may reference images not displayed]

FINDINGS: CTA NECK FINDINGS

Aortic arch: Visualized aortic arch of normal caliber with normal
branch pattern. No hemodynamically significant stenosis seen about
the origin of the great vessels. Visualized subclavian arteries
widely patent.

Right carotid system: Right common carotid artery widely patent from
its origin to the bifurcation without stenosis. Mild eccentric
calcified plaque at the right bifurcation without significant
stenosis. Right ICA widely patent distally to the skull base without
stenosis, dissection or occlusion.

Left carotid system: Left common carotid artery widely patent from
its origin to the bifurcation without stenosis. No significant
atheromatous narrowing about the left bifurcation. Left ICA widely
patent distally to the skull base without stenosis, dissection or
occlusion.

Vertebral arteries: Both vertebral arteries arise from the
subclavian arteries. Vertebral arteries widely patent without
stenosis, dissection or occlusion.

Skeleton: No acute osseous abnormality. No discrete or worrisome
osseous lesions.

Other neck: No other acute soft tissue abnormality within the neck.
11 mm right thyroid nodule noted (series 4, image 69). No other mass
lesion or adenopathy.

Upper chest: Visualized upper chest demonstrates no acute finding.

Review of the MIP images confirms the above findings

CTA HEAD FINDINGS

Anterior circulation: Both internal carotid arteries widely patent
to the termini without stenosis. A1 segments widely patent. Normal
anterior communicating artery complex. Anterior cerebral arteries
widely patent to their distal aspects. No M1 stenosis or occlusion.
Normal MCA bifurcations. Distal MCA branches well perfused and
symmetric.

Posterior circulation: Both vertebral arteries widely patent to the
vertebrobasilar junction without stenosis. Both picas patent.
Basilar widely patent to its distal aspect. Superior cerebral
arteries patent bilaterally. Both PCAs primarily supplied via the
basilar well perfused to their distal aspects.

Venous sinuses: Patent.

Anatomic variants: None significant.

Review of the MIP images confirms the above findings
IMPRESSION: 1. Negative CTA for large vessel occlusion.
2. Mild for age atheromatous change about the right carotid
bifurcation without stenosis. No other significant atheromatous
disease about the major arterial vasculature of the head and neck.
No hemodynamically significant or correctable stenosis.
3. 11 mm right thyroid nodule, of doubtful significance given size
and patient age. No follow-up imaging recommended. (Ref: [HOSPITAL]. [DATE]): 143-50).

Results were called by telephone at the time of interpretation on
[DATE] at [DATE] to provider Dr. MATHIAS, Who verbally acknowledged
these results.

## 2019-09-29 IMAGING — CT CT ANGIO HEAD
1 of 8 series · 6 of 33 positions shown · IV contrast (Omnipaque)
Comparison: Prior head CT from earlier same day.

CLINICAL DATA: Initial evaluation for acute right-sided weakness.

EXAM:
CT ANGIOGRAPHY HEAD AND NECK
TECHNIQUE: Multidetector CT imaging of the head and neck was performed using
the standard protocol during bolus administration of intravenous
contrast. Multiplanar CT image reconstructions and MIPs were
obtained to evaluate the vascular anatomy. Carotid stenosis
measurements (when applicable) are obtained utilizing NASCET
criteria, using the distal internal carotid diameter as the
denominator.
CONTRAST:  100mL OMNIPAQUE IOHEXOL 350 MG/ML SOLN

[Series 6: axial thin · axial · 0.55mm/px · z∈[-336,-70]mm · 6 of 374 slices shown]
[im 54/374  soft-tissue]
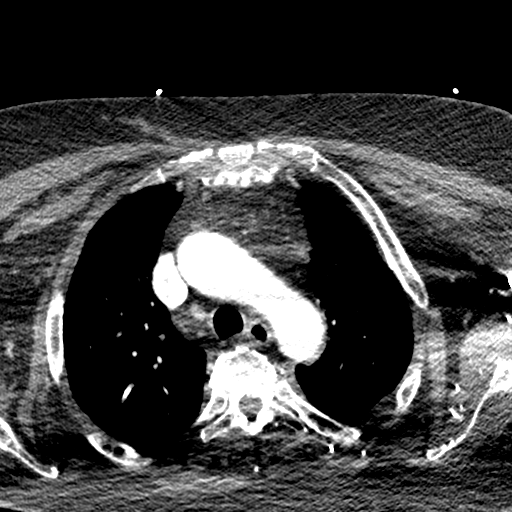
[im 107/374  bone]
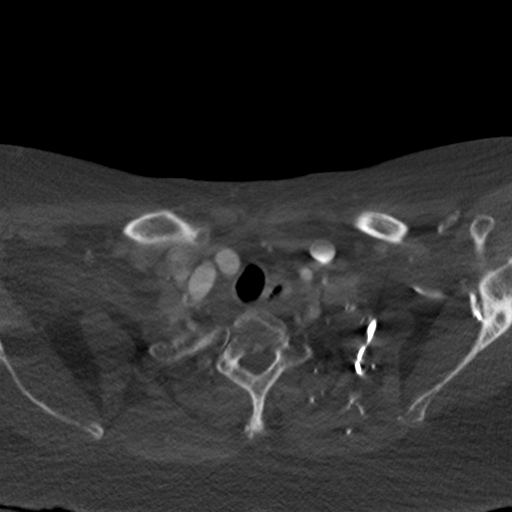
[im 160/374  soft-tissue]
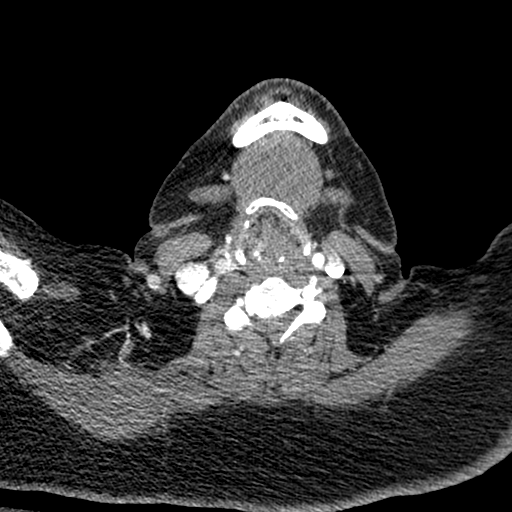
[im 214/374  bone]
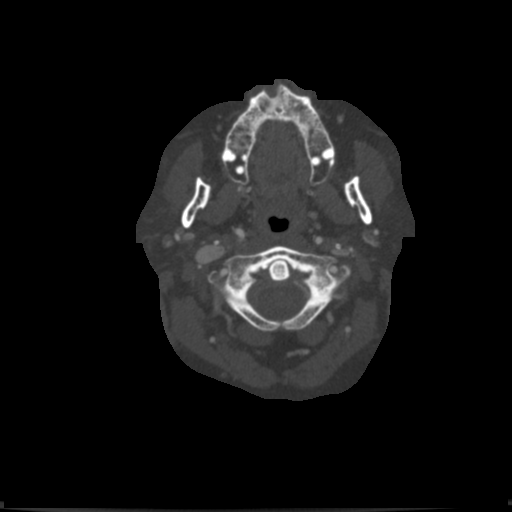
[im 267/374  soft-tissue]
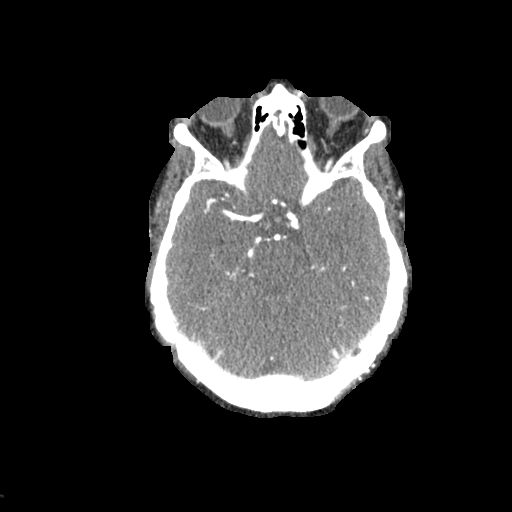
[im 320/374  bone]
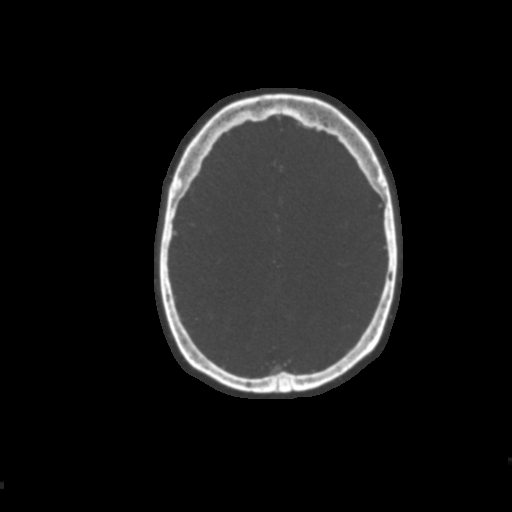

[6 of 33 positions shown; findings below may reference images not displayed]

FINDINGS: CTA NECK FINDINGS

Aortic arch: Visualized aortic arch of normal caliber with normal
branch pattern. No hemodynamically significant stenosis seen about
the origin of the great vessels. Visualized subclavian arteries
widely patent.

Right carotid system: Right common carotid artery widely patent from
its origin to the bifurcation without stenosis. Mild eccentric
calcified plaque at the right bifurcation without significant
stenosis. Right ICA widely patent distally to the skull base without
stenosis, dissection or occlusion.

Left carotid system: Left common carotid artery widely patent from
its origin to the bifurcation without stenosis. No significant
atheromatous narrowing about the left bifurcation. Left ICA widely
patent distally to the skull base without stenosis, dissection or
occlusion.

Vertebral arteries: Both vertebral arteries arise from the
subclavian arteries. Vertebral arteries widely patent without
stenosis, dissection or occlusion.

Skeleton: No acute osseous abnormality. No discrete or worrisome
osseous lesions.

Other neck: No other acute soft tissue abnormality within the neck.
11 mm right thyroid nodule noted (series 4, image 69). No other mass
lesion or adenopathy.

Upper chest: Visualized upper chest demonstrates no acute finding.

Review of the MIP images confirms the above findings

CTA HEAD FINDINGS

Anterior circulation: Both internal carotid arteries widely patent
to the termini without stenosis. A1 segments widely patent. Normal
anterior communicating artery complex. Anterior cerebral arteries
widely patent to their distal aspects. No M1 stenosis or occlusion.
Normal MCA bifurcations. Distal MCA branches well perfused and
symmetric.

Posterior circulation: Both vertebral arteries widely patent to the
vertebrobasilar junction without stenosis. Both picas patent.
Basilar widely patent to its distal aspect. Superior cerebral
arteries patent bilaterally. Both PCAs primarily supplied via the
basilar well perfused to their distal aspects.

Venous sinuses: Patent.

Anatomic variants: None significant.

Review of the MIP images confirms the above findings
IMPRESSION: 1. Negative CTA for large vessel occlusion.
2. Mild for age atheromatous change about the right carotid
bifurcation without stenosis. No other significant atheromatous
disease about the major arterial vasculature of the head and neck.
No hemodynamically significant or correctable stenosis.
3. 11 mm right thyroid nodule, of doubtful significance given size
and patient age. No follow-up imaging recommended. (Ref: [HOSPITAL]. [DATE]): 143-50).

Results were called by telephone at the time of interpretation on
[DATE] at [DATE] to provider Dr. MATHIAS, Who verbally acknowledged
these results.

## 2019-09-29 MED ORDER — INSULIN ASPART 100 UNIT/ML ~~LOC~~ SOLN
0.0000 [IU] | SUBCUTANEOUS | Status: DC
  Administered 2019-09-29: 4 [IU] via SUBCUTANEOUS
  Administered 2019-09-29: 20:00:00 7 [IU] via SUBCUTANEOUS
  Administered 2019-09-29: 08:00:00 15 [IU] via SUBCUTANEOUS
  Administered 2019-09-30: 12:00:00 7 [IU] via SUBCUTANEOUS
  Administered 2019-09-30: 04:00:00 11 [IU] via SUBCUTANEOUS
  Administered 2019-09-30: 08:00:00 7 [IU] via SUBCUTANEOUS
  Administered 2019-09-30 (×2): 11 [IU] via SUBCUTANEOUS
  Administered 2019-09-30: 20:00:00 7 [IU] via SUBCUTANEOUS
  Administered 2019-09-30: 15 [IU] via SUBCUTANEOUS
  Administered 2019-10-01: 08:00:00 4 [IU] via SUBCUTANEOUS
  Administered 2019-10-01: 04:00:00 20 [IU] via SUBCUTANEOUS
  Administered 2019-10-01: 12:00:00 3 [IU] via SUBCUTANEOUS

## 2019-09-29 MED ORDER — LABETALOL HCL 5 MG/ML IV SOLN
INTRAVENOUS | Status: AC
  Filled 2019-09-29: qty 4

## 2019-09-29 MED ORDER — STROKE: EARLY STAGES OF RECOVERY BOOK
Freq: Once | Status: AC
  Filled 2019-09-29: qty 1

## 2019-09-29 MED ORDER — INSULIN ASPART 100 UNIT/ML ~~LOC~~ SOLN
10.0000 [IU] | Freq: Once | SUBCUTANEOUS | Status: AC
  Administered 2019-09-29: 10 [IU] via SUBCUTANEOUS

## 2019-09-29 MED ORDER — ALTEPLASE (STROKE) FULL DOSE INFUSION
90.0000 mg | Freq: Once | INTRAVENOUS | Status: AC
  Administered 2019-09-29: 01:00:00 90 mg via INTRAVENOUS
  Filled 2019-09-29: qty 100

## 2019-09-29 MED ORDER — ACETAMINOPHEN 650 MG RE SUPP: 650.0000 mg | RECTAL | Status: DC | PRN

## 2019-09-29 MED ORDER — SODIUM CHLORIDE 0.9% FLUSH
3.0000 mL | Freq: Once | INTRAVENOUS | Status: DC
  Filled 2019-09-29: qty 3

## 2019-09-29 MED ORDER — CLONAZEPAM 0.5 MG PO TABS
0.5000 mg | ORAL_TABLET | Freq: Every day | ORAL | Status: DC
  Administered 2019-09-29 – 2019-10-01 (×3): 0.5 mg via ORAL
  Filled 2019-09-29 (×3): qty 1

## 2019-09-29 MED ORDER — ALTEPLASE 100 MG IV SOLR
INTRAVENOUS | Status: AC
  Filled 2019-09-29: qty 100

## 2019-09-29 MED ORDER — FLUCONAZOLE 200 MG PO TABS
200.0000 mg | ORAL_TABLET | Freq: Every day | ORAL | Status: DC
  Administered 2019-09-29 – 2019-09-30 (×2): 200 mg via ORAL
  Filled 2019-09-29 (×2): qty 1

## 2019-09-29 MED ORDER — INSULIN ASPART 100 UNIT/ML ~~LOC~~ SOLN: 0.0000 [IU] | Freq: Three times a day (TID) | SUBCUTANEOUS | Status: DC

## 2019-09-29 MED ORDER — LABETALOL HCL 5 MG/ML IV SOLN: 20.0000 mg | Freq: Once | INTRAVENOUS | Status: DC

## 2019-09-29 MED ORDER — ALBUTEROL SULFATE (2.5 MG/3ML) 0.083% IN NEBU: 3.0000 mL | INHALATION_SOLUTION | Freq: Four times a day (QID) | RESPIRATORY_TRACT | Status: DC | PRN

## 2019-09-29 MED ORDER — CHLORHEXIDINE GLUCONATE CLOTH 2 % EX PADS
6.0000 | MEDICATED_PAD | Freq: Every day | CUTANEOUS | Status: DC
  Administered 2019-09-29 – 2019-09-30 (×2): 6 via TOPICAL

## 2019-09-29 MED ORDER — SODIUM CHLORIDE 0.9 % IV SOLN: INTRAVENOUS | Status: DC

## 2019-09-29 MED ORDER — CLEVIDIPINE BUTYRATE 0.5 MG/ML IV EMUL
0.0000 mg/h | INTRAVENOUS | Status: DC
  Filled 2019-09-29: qty 50

## 2019-09-29 MED ORDER — PANTOPRAZOLE SODIUM 40 MG IV SOLR
40.0000 mg | Freq: Every day | INTRAVENOUS | Status: DC
  Administered 2019-09-29: 22:00:00 40 mg via INTRAVENOUS
  Filled 2019-09-29: qty 40

## 2019-09-29 MED ORDER — AMITRIPTYLINE HCL 25 MG PO TABS
75.0000 mg | ORAL_TABLET | Freq: Every day | ORAL | Status: DC
  Administered 2019-09-29 – 2019-09-30 (×2): 75 mg via ORAL
  Filled 2019-09-29 (×2): qty 3

## 2019-09-29 MED ORDER — INSULIN ASPART 100 UNIT/ML ~~LOC~~ SOLN
20.0000 [IU] | Freq: Once | SUBCUTANEOUS | Status: AC
  Administered 2019-09-29: 03:00:00 20 [IU] via SUBCUTANEOUS

## 2019-09-29 MED ORDER — ACETAMINOPHEN 160 MG/5ML PO SOLN: 650.0000 mg | ORAL | Status: DC | PRN

## 2019-09-29 MED ORDER — TRAZODONE HCL 50 MG PO TABS
100.0000 mg | ORAL_TABLET | Freq: Every day | ORAL | Status: DC
  Administered 2019-09-29 – 2019-09-30 (×2): 100 mg via ORAL
  Filled 2019-09-29 (×2): qty 2

## 2019-09-29 MED ORDER — IOHEXOL 350 MG/ML SOLN
100.0000 mL | Freq: Once | INTRAVENOUS | Status: AC | PRN
  Administered 2019-09-29: 01:00:00 100 mL via INTRAVENOUS

## 2019-09-29 MED ORDER — SODIUM CHLORIDE 0.9 % IV BOLUS
1000.0000 mL | Freq: Once | INTRAVENOUS | Status: AC
  Administered 2019-09-29: 03:00:00 1000 mL via INTRAVENOUS

## 2019-09-29 MED ORDER — ATORVASTATIN CALCIUM 40 MG PO TABS
40.0000 mg | ORAL_TABLET | Freq: Every day | ORAL | Status: DC
  Administered 2019-09-29 – 2019-10-01 (×3): 40 mg via ORAL
  Filled 2019-09-29 (×3): qty 1

## 2019-09-29 MED ORDER — SENNOSIDES-DOCUSATE SODIUM 8.6-50 MG PO TABS: 1.0000 | ORAL_TABLET | Freq: Every evening | ORAL | Status: DC | PRN

## 2019-09-29 MED ORDER — ONDANSETRON HCL 4 MG PO TABS: 8.0000 mg | ORAL_TABLET | Freq: Three times a day (TID) | ORAL | Status: DC | PRN

## 2019-09-29 MED ORDER — ACETAMINOPHEN 325 MG PO TABS
650.0000 mg | ORAL_TABLET | ORAL | Status: DC | PRN
  Administered 2019-09-29 – 2019-09-30 (×3): 650 mg via ORAL
  Filled 2019-09-29 (×3): qty 2

## 2019-09-29 MED ORDER — SODIUM CHLORIDE 0.9 % IV SOLN
50.0000 mL | Freq: Once | INTRAVENOUS | Status: AC
  Administered 2019-09-29: 50 mL via INTRAVENOUS

## 2019-09-29 MED ORDER — LURASIDONE HCL 20 MG PO TABS
60.0000 mg | ORAL_TABLET | Freq: Every day | ORAL | Status: DC
  Administered 2019-09-29 – 2019-09-30 (×2): 60 mg via ORAL
  Filled 2019-09-29 (×3): qty 3

## 2019-09-29 MED ORDER — NICARDIPINE HCL IN NACL 20-0.86 MG/200ML-% IV SOLN: 0.0000 mg/h | INTRAVENOUS | Status: DC

## 2019-09-29 MED ORDER — CHLORHEXIDINE GLUCONATE 0.12 % MT SOLN
15.0000 mL | Freq: Two times a day (BID) | OROMUCOSAL | Status: DC
  Administered 2019-09-29 – 2019-09-30 (×3): 15 mL via OROMUCOSAL
  Filled 2019-09-29 (×4): qty 15

## 2019-09-29 MED ORDER — LAMOTRIGINE 100 MG PO TABS
200.0000 mg | ORAL_TABLET | Freq: Every day | ORAL | Status: DC
  Administered 2019-09-29 – 2019-10-01 (×3): 200 mg via ORAL
  Filled 2019-09-29 (×3): qty 2

## 2019-09-29 NOTE — Plan of Care (Addendum)
Received a call from Dr. Eudelia Bunch at [facility] regarding this patient. Right-sided weakness and numbness. Receiving TPA after telemedicine neurology evaluated the patient and made a decision to give IV TPA. CTA head and neck with no LVO Will accept for admission to the neurological ICU at [facility] under neurology service.  -- Milon Dikes, MD [facility]

## 2019-09-29 NOTE — ED Notes (Signed)
Paged Dr. Wilford Corner through [facility]) per Dr. Eudelia Bunch

## 2019-09-29 NOTE — Progress Notes (Signed)
Per [facility] tPA finished @0138 

## 2019-09-29 NOTE — ED Notes (Signed)
Returned from CT.

## 2019-09-29 NOTE — Progress Notes (Signed)
PT Cancellation Note  Patient Details Name: Monique Holland MRN: [id number] DOB: 1965-12-23   Cancelled Treatment:    Reason Eval/Treat Not Completed: Active bedrest order. Pt on bedrest for 24 hours s/p TPA administration.   Arlyss Gandy 09/29/2019, 10:57 AM

## 2019-09-29 NOTE — H&P (Addendum)
Stroke Neurology Admission History and Physical  Status post TPA at an outside center via telemedicine neurology  CC: Right-sided weakness.  History is obtained from: Patient, chart  HPI: Monique Holland is a 54 y.o. female who was brought in as a drop off patient at [facility] with complaints of right-sided weakness with last known normal 9 PM. Evaluated by telemedicine neurology at [facility] and given IV TPA and transferred to [facility] comprehensive stroke center for post TPA care.  Monique Holland has a past medical history of hypertension, obesity, diabetes, sleep apnea, depression, bipolar schizophrenia.  Patient reports that she was in her usual state of health, few days ago when she started having episodes of hyperglycemia and hypertension that was difficult to control.  She had a ER visit on 09/24/2019 for severe hyperglycemia without DKA.  She continued to have difficulty controlling her sugars.  She reports compliance to medications to me but to the nurses she reported that she does not often check her sugars only takes her Lantus and does not adhere to the diabetic regimen. Today, she had had some nausea and feeling of not being well for most of the day but around 9 PM she had sudden onset of worsening nausea and feeling that her right arm and leg has gone numb and she could not lift it.  She reported the numbness as pinprick/ants crawling sensation and not Novocain-like numbness.  Her son drove her to the [facility] where she was evaluated by telemedicine neurology.  NIH stroke scale documented in the telemedicine consultation is a 6.  Risk benefits of TPA per the chart were discussed by telemedicine neurology, and TPA was given after noncontrast head CT did not show any bleed. CTA head and neck was done-unremarkable for LVO. Transferred over to [facility] for post TPA care. Blood pressures were slightly above the accepted range for TPA  and required 1 dose of labetalol IV 10 mg. When transfer was called, I requested that Cardene drip be available should the pressures go out of range.  Denies chest pain shortness of breath.  Denies nausea vomiting at this time.  Denies abdominal pain.  Denies sick contacts. Received her Covid vaccination-Pfizer-second dose received middle of April 2021.  Chart review also reveals that she has undergone upper GI endoscopy with Dr. 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  Her last hemoglobin A1c 09/17/2019 was 13.0.  Denies recent stressors or anxiety.  Denies worsening depression.  Denies any suicidal or homicidal ideation.  Blood glucose was 595 today prior to TPA administration.  LKW: 2100 hrs. on 09/28/2019 tpa given?:  Yes-by telemedicine neurology at 00 39 hours on 09/29/2019.  Premorbid modified Rankin scale (mRS): 1  ROS: Review of systems performed and negative except noted in HPI.  Past Medical History:  Diagnosis Date  . Anxiety   . Asthma   . Bipolar disorder (HCC)   . Depression   . Diabetes mellitus without complication (HCC)   . Frequency of urination   . GERD (gastroesophageal reflux disease)   . History of asthma    last episode yrs ago  . Hypertension   . OSA (obstructive sleep apnea)    pt had study done oct 2014--  schedule for cpap titrate after kidney stone surgery  . Pancreatitis   . Pseudocyst of pancreas   . Schizophrenia (HCC)   . Ureteral calculi  BILATERAL  . Wears glasses    Family History  Family history unknown: Yes   Social History:   reports that she has never smoked. She has never used smokeless tobacco. She reports that she does not drink alcohol or use drugs.  Medications  Current Facility-Administered Medications:  .   stroke: mapping our early stages of recovery book, , Does not apply, Once, Monique Portland, MD .  [COMPLETED] alteplase (ACTIVASE) 1 mg/mL  infusion 90 mg, 90 mg, Intravenous, Once, Last Rate: 90 mL/hr at 09/29/19 0039, 90 mg at 09/29/19 0039 **FOLLOWED BY** 0.9 %  sodium chloride infusion, 50 mL, Intravenous, Once, Corti, Stephan Minister, MD .  0.9 %  sodium chloride infusion, , Intravenous, Continuous, Monique Portland, MD .  acetaminophen (TYLENOL) tablet 650 mg, 650 mg, Oral, Q4H PRN **OR** acetaminophen (TYLENOL) 160 MG/5ML solution 650 mg, 650 mg, Per Tube, Q4H PRN **OR** acetaminophen (TYLENOL) suppository 650 mg, 650 mg, Rectal, Q4H PRN, Monique Portland, MD .  alteplase (ACTIVASE) 1 mg/mL injection, , , ,  .  chlorhexidine (PERIDEX) 0.12 % solution 15 mL, 15 mL, Mouth Rinse, BID, Monique Portland, MD .  Chlorhexidine Gluconate Cloth 2 % PADS 6 each, 6 each, Topical, Daily, Monique Portland, MD .  labetalol (NORMODYNE) injection 20 mg, 20 mg, Intravenous, Once **AND** clevidipine (CLEVIPREX) infusion 0.5 mg/mL, 0-21 mg/hr, Intravenous, Continuous, Monique Portland, MD .  labetalol (NORMODYNE) injection 20 mg, 20 mg, Intravenous, Once, Stopped at 09/29/19 0035 **AND** [DISCONTINUED] nicardipine (CARDENE) 20mg  in 0.86% saline 280ml IV infusion (0.1 mg/ml), 0-15 mg/hr, Intravenous, Continuous, Corti, Stephan Minister, MD .  pantoprazole (PROTONIX) injection 40 mg, 40 mg, Intravenous, QHS, Monique Portland, MD .  senna-docusate (Senokot-S) tablet 1 tablet, 1 tablet, Oral, QHS PRN, Monique Portland, MD .  sodium chloride flush (NS) 0.9 % injection 3 mL, 3 mL, Intravenous, Once, Cardama, Grayce Sessions, MD  Exam: Current vital signs: BP (!) 159/101   Pulse (!) 102   Temp 98.5 F (36.9 C) (Oral)   Resp (!) 21   Wt 130.1 kg   LMP 03/23/2014 Comment: (-) u preg?/ac  SpO2 100%   BMI 43.61 kg/m  Vital signs in last 24 hours: Temp:  [98.5 F (36.9 C)] 98.5 F (36.9 C) (05/09 0016) Pulse Rate:  [96-118] 102 (05/09 0200) Resp:  [14-29] 21 (05/09 0200) BP: (153-183)/(95-118) 159/101 (05/09 0200) SpO2:  [99 %-100 %] 100 % (05/09 0200) Weight:  [130.1 kg] 130.1 kg  (05/09 0016) General: Awake alert in no distress.  Overweight. HEENT: Normocephalic atraumatic Lungs clear CVS regular rate rhythm Abdomen nondistended nontender Extremities warm well perfused Neurological exam Awake alert oriented x3 No dysarthria No aphasia Cranial nerves: Pupils equal round reactive light, extraocular movements appear intact, visual field testing reveals inconsistent visual field cut on the right, facial sensation diminished on the right with a sharp cut off in the middle, tongue and palate midline, articulate intact. Motor exam: Right upper extremity 4/5 and right lower extremity 4/5 with marked effort dependent weakness.  Hoover sign positive on the right leg.  5/5 on the left. Sensory exam: Diminished sensation on the right with some inconsistencies and sharp midline cut off raising some suspicion for a nonorganic etiology. Coordination: Intact Gait testing deferred at this time NIH stroke scale 1a Level of Conscious.: 0 1b LOC Questions: 0 1c LOC Commands: 0 2 Best Gaze: 0 3 Visual: 1 4 Facial Palsy: 0 5a Motor Arm - left: 0 5b Motor Arm - Right: 1 6a Motor Leg -  Left: 0 6b Motor Leg - Right: 1 7 Limb Ataxia: 0 8 Sensory: 1 9 Best Language: 0 10 Dysarthria: 0 11 Extinct. and Inatten.: 0 TOTAL: 4  Labs I have reviewed labs in epic and the results pertinent to this consultation are:  CBC    Component Value Date/Time   WBC 7.1 09/29/2019 0018   RBC 4.56 09/29/2019 0018   HGB 9.9 (L) 09/29/2019 0018   HCT 32.8 (L) 09/29/2019 0018   PLT 312 09/29/2019 0018   MCV 71.9 (L) 09/29/2019 0018   MCH 21.7 (L) 09/29/2019 0018   MCHC 30.2 09/29/2019 0018   RDW 14.9 09/29/2019 0018   LYMPHSABS 2.1 09/29/2019 0018   MONOABS 0.6 09/29/2019 0018   EOSABS 0.1 09/29/2019 0018   BASOSABS 0.1 09/29/2019 0018   CMP     Component Value Date/Time   NA 127 (L) 09/29/2019 0018   K 3.8 09/29/2019 0018   CL 90 (L) 09/29/2019 0018   CO2 25 09/29/2019 0018    GLUCOSE 595 (HH) 09/29/2019 0018   BUN 10 09/29/2019 0018   CREATININE 0.94 09/29/2019 0018   CALCIUM 8.8 (L) 09/29/2019 0018   CALCIUM 11.5 (H) 09/03/2018 0935   PROT 7.5 09/29/2019 0018   ALBUMIN 3.0 (L) 09/29/2019 0018   AST 17 09/29/2019 0018   ALT 11 09/29/2019 0018   ALKPHOS 85 09/29/2019 0018   BILITOT 0.4 09/29/2019 0018   GFRNONAA >60 09/29/2019 0018   GFRAA >60 09/29/2019 0018   Imaging I have reviewed the images obtained: CT-scan of the brain-ASPECTS 10, no bleed. CTA-No LVO Incidental finding of 11 mm right thyroid nodule-doubtful significance given size and patient age.  No follow-up imaging recommended.  Assessment:  54 year old with above past medical history presents to an outside facility with sudden onset of right-sided weakness and numbness. Outside facility NIH stroke scale 6 for which she was given IV TPA and transferred to [facility]. On my examination, she appears to have some right-sided weakness but a good component of that also is effort dependent. I am not certain at this point if all her findings are due to the stroke or if there is a component of some psychogenic weakness also but she has enough risk factors and IV TPA has already been administered, so we will continue post TPA care. Of note, she reported compliance to me but to her nurse that she has reported noncompliance to medications as well. She would need streamlined outpatient follow-up with primary care and endocrinology in terms of her diabetes management.  Impression: Acute ischemic stroke - question mimic due to severe hyperglycemia vs small vessel etiology stroke given risk factors Uncontrolled diabetes Obesity Uncontrolled hypertension Noncompliance to medications Bipolar Depression Schizophrenia   Plan:  Acute Ischemic Stroke Cerebral infarction due to embolism of other cerebral artery Cerebral atherosclerosis  Acuity: Acute Suspected etiology: Small vessel disease  versus atheroembolic. Admit to neuro ICU  CNS: Hold anticoagulation or antiplatelets for at least 24 hours after TPA.  Once 24-hour post TPA imaging is stable and without a bleed, can start antiplatelets. Blood pressure control post TPA-systolic less than 180. MRI brain without contrast 2D echocardiogram A1c Lipid panel Hyperglycemia management-20 units of insulin aspart now.  Sliding scale (resistant) Goal will be to maintain glucose between 1 40-1 80. PT OT speech therapy Close neuro monitoring  Dysarthria Dysphagia following cerebral infarction  -N.p.o. until cleared by speech or bedside swallow eval  Hemiplegia and hemiparesis following cerebral infarction affecting right dominant side -  PT/OT  RESP: No acute changes Monitor clinically  CV Hypertensive urgency Essential hypertension -Blood pressure control-parameters as above. -Use labetalol IV and Cleviprex drip -Transthoracic echo  Hyperlipidemia, unspecified  - Statin for goal LDL < 70   HEME Iron Deficiency Anemia -Monitor -transfuse for hgb < 7 -Platelet count is normal  ENDO Type 2 diabetes mellitus with hyperglycemia-BMP blood glucose 595.  Not in DKA. -SSI-resistant -Give a dose of 20 units insulin and if sugars are not trending the right direction, will start on the drip.  Otherwise SSI as above. -1L NS bolus -goal HgbA1c < 7  GI/GU No active issues -Normal saline at 100 cc an hour.  -Check BMP in the morning -Replete electrolytes as necessary  Hyponatremia -Likely secondary pseudohyponatremia in the setting of hyperglycemia. -Check labs in the morning   ID No active issues -Has esophageal candidiasis-GI following as outpatient.  Behavioral health Schizophrenia Bipolar disorder Depression -Resume home medications-Lamictal, clonazepam, trazodone, Latuda.  Nutrition E66.9 Obesity  -diet consult  Prophylaxis DVT: SCDs GI: PPI Bowel: Docusate senna  Diet: NPO until cleared by  speech or bedside nursing stroke screen evaluation  Code Status: Full Code   THE FOLLOWING WERE PRESENT ON ADMISSION: Acute ischemic stroke, coagulopathy due to TPA administration at an outside emergency room, hyponatremia, noncompliance to medications, esophageal candidiasis  -- Milon Dikes, MD [facility] Pager: [phone] If 7pm to 7am, please call on call as listed on AMION.   CRITICAL CARE ATTESTATION Performed by: Milon Dikes, MD Total critical care time: 45 minutes Critical care time was exclusive of separately billable procedures and treating other patients and/or supervising APPs/Residents/Students Critical care was necessary to treat or prevent imminent or life-threatening deterioration due to acute ischemic stroke status post thrombolysis, hypertensive emergency/urgency. This patient is critically ill and at significant risk for neurological worsening and/or death and care requires constant monitoring. Critical care was time spent personally by me on the following activities: development of treatment plan with patient and/or surrogate as well as nursing, discussions with consultants, evaluation of patient's response to treatment, examination of patient, obtaining history from patient or surrogate, ordering and performing treatments and interventions, ordering and review of laboratory studies, ordering and review of radiographic studies, pulse oximetry, re-evaluation of patient's condition, participation in multidisciplinary rounds and medical decision making of high complexity in the care of this patient.   ADDENDUM -Sugar now 329 after 20 units of aspart insulin. -Give additional 10 units of aspart. Followed by [facility] q4h.  -- Milon Dikes, MD [facility] Pager: [phone] If 7pm to 7am, please call on call as listed on AMION.

## 2019-09-29 NOTE — ED Notes (Addendum)
Code Stroke cart button pushed, pt information given to neuro RN Dorathy Daft).

## 2019-09-29 NOTE — Consult Note (Addendum)
[facility] TeleNeurology Consult Services   Date of Service:   09/29/2019 00:12:45  Impression:     .  I63.9 - Cerebrovascular accident (CVA), unspecified mechanism ([place])  Comments/Sign-Out: 54yo woman w/PMH of HTN, DM, OSA, GERD, depression, bipolar, schizophrenia p/w right sided numbness/weakness on 09/29/19. She has had nausea all evening but around 21:00 she was sitting down when she started feeling right arm/leg numbness and weakness so her son brought her to the ED. She denies vision or speech change, LOC, headache, chest pain, vomiting, prior episodes. She was given insulin with no improvement in symptoms. NIHSS 6 with right arm/leg weakness and sensory loss, otherwise intact. CTH has no acute findings. After no contraindications were identified, I discussed the indication, side effects and risk/benefit of thrombolytics compared to antiplatelet therapy with the patient who accepted treatment. CTA Head/Neck will be followed up to rule out large vessel occlusion. Presentation is concerning for acute ischemic stroke based on history and exam, stroke workup with post thrombolytics protocol advised.  Metrics: Last Known Well: 09/28/2019 21:00:00 [facility] Notification Time: 09/29/2019 00:12:45 Arrival Time: 09/29/2019 00:01:00 Stamp Time: 09/29/2019 00:12:45 Time First Login Attempt: 09/29/2019 00:17:51 Symptoms: right sided numbness/weakness NIHSS Start Assessment Time: 09/29/2019 00:20:00 Alteplase Early Mix Decision Time: 09/29/2019 00:25:00 Patient is a candidate for Alteplase/Activase. Alteplase Medical Decision: 09/29/2019 00:25:00 Alteplase/Activase CPOE Order Time: 09/29/2019 00:27:19 Needle Time: 09/29/2019 00:39:00 Weight Noted by Staff: 130.1 kg  CT head showed no acute hemorrhage or acute core infarct.  Lower Likelihood of Large Vessel Occlusion but Following Stat Studies are Recommended  CTA Head and Neck.   ED Physician notified of diagnostic  impression and management plan on 09/29/2019 00:35:00  Alteplase/Activase Contraindications:  Last Known Well > 4.5 hours: No CT Head showing hemorrhage: No Ischemic stroke within 3 months: No Severe head trauma within 3 months: No Intracranial/intraspinal surgery within 3 months: No History of intracranial hemorrhage: No Symptoms and signs consistent with an SAH: No GI malignancy or GI bleed within 21 days: No Coagulopathy: Platelets <100 000/mm3, INR >1.7, aPTT>40 s, or PT >15 s: No Treatment dose of LMWH within the previous 24 hrs: No Use of NOACs in past 48 hours: No Glycoprotein IIb/IIIa receptor inhibitors use: No Symptoms consistent with infective endocarditis: No Suspected aortic arch dissection: No Intra-axial intracranial neoplasm: No  Verbal Consent to Alteplase/Activase: I have explained to the Patient the nature of the patient's condition, reviewed the indications and contraindications to the use of Alteplase/Activase fibrinolytic agent, reviewed the indications and contraindications and the benefits to be reasonably expected compared with alternative approaches. I have discussed the likelihood of major risks or complications of this procedure including (if applicable) but not limited to loss of limb function, brain damage, paralysis, hemorrhage, infection, complications from transfusion of blood components, drug reactions, blood clots and loss of life. I have also indicated that with any procedure there is always the possibility of an unexpected complication. All questions were answered and Patient express understanding of the treatment plan and consent to the treatment.  Our recommendations are outlined below.  Recommendations: IV Alteplase/Activase recommended.  Alteplase/Activase bolus given Without Complication.   IV Alteplase/Activase Total Dose - 90.0 mg IV Alteplase/Activase Bolus Dose - 9.0 mg IV Alteplase/Activase Infusion Dose - 81.0 mg  Routine post  Alteplase/Activase monitoring including neuro checks and blood pressure control during/after treatment Monitor blood pressure Check blood pressure and neuro assessment every 15 min for 2 h, then every 30 min for 6 h, and finally every hour for  16 h.  Manage Blood Pressure per post Alteplase/Activase protocol.      .  Admission to ICU     .  CT brain 24 hours post Alteplase/Activase     .  NPO until swallowing screen performed and passed     .  No antiplatelet agents or anticoagulants (including heparin for DVT prophylaxis) in first 24 hours     .  No Foley catheter, nasogastric tube, arterial catheter or central venous catheter for 24 hr, unless absolutely necessary     .  Telemetry     .  Bedside swallow evaluation     .  HOB less than 30 degrees     .  Euglycemia     .  Avoid hyperthermia, PRN acetaminophen     .  DVT prophylaxis     .  Inpatient Neurology Consultation     .  Stroke evaluation as per inpatient neurology recommendations  Discussed with ED physician    ------------------------------------------------------------------------------  History of Present Illness: Patient is a 54 year old Female.  Patient was brought by private transportation with symptoms of right sided numbness/weakness  53yo woman w/PMH of HTN, DM, OSA, GERD, depression, bipolar, schizophrenia p/w right sided numbness/weakness on 09/29/19. She has had nausea all evening but around 21:00 she was sitting down when she started feeling right arm/leg numbness and weakness so her son brought her to the ED. She denies vision or speech change, headache, chest pain, vomiting, prior episodes.  Last seen normal was within 4.5 hours. There is no history of hemorrhagic complications or intracranial hemorrhage. There is no history of Recent Anticoagulants. There is no history of recent major surgery. There is no history of recent stroke.  Past Medical History:     . Hypertension     . Diabetes Mellitus     .  There is NO history of Hyperlipidemia     . There is NO history of Atrial Fibrillation     . There is NO history of Coronary Artery Disease     . There is NO history of Stroke  Anticoagulant use:  No  Antiplatelet use: No   Examination: BP(170/105), Pulse(118), Blood Glucose(562) 1A: Level of Consciousness - Alert; keenly responsive + 0 1B: Ask Month and Age - Both Questions Right + 0 1C: Blink Eyes & Squeeze Hands - Performs Both Tasks + 0 2: Test Horizontal Extraocular Movements - Normal + 0 3: Test Visual Fields - No Visual Loss + 0 4: Test Facial Palsy (Use Grimace if Obtunded) - Normal symmetry + 0 5A: Test Left Arm Motor Drift - No Drift for 10 Seconds + 0 5B: Test Right Arm Motor Drift - No Effort Against Gravity + 3 6A: Test Left Leg Motor Drift - No Drift for 5 Seconds + 0 6B: Test Right Leg Motor Drift - Some Effort Against Gravity + 2 7: Test Limb Ataxia (FNF/Heel-Shin) - No Ataxia + 0 8: Test Sensation - Mild-Moderate Loss: Can Sense Being Touched + 1 9: Test Language/Aphasia - Normal; No aphasia + 0 10: Test Dysarthria - Normal + 0 11: Test Extinction/Inattention - No abnormality + 0  NIHSS Score: 6  Pre-Morbid Modified Ranking Scale: 0 Points = No symptoms at all   Patient/Family was informed the Neurology Consult would occur via TeleHealth consult by way of interactive audio and video telecommunications and consented to receiving care in this manner.   Patient is being evaluated for possible acute neurologic impairment and high  probability of imminent or life-threatening deterioration. I spent total of 35 minutes providing care to this patient, including time for face to face visit via telemedicine, review of medical records, imaging studies and discussion of findings with providers, the patient and/or family.   Dr Carson Myrtle Camay Pedigo   [facility] [phone]  Case [id number]   ADDENDUM: CTA Head/Neck preliminary reading shows no large vessel occlusion,  follow-up official report and proceed with post-alteplase management as above.  ADDENDUM: Radiology confirmed no LVO, see report for details.

## 2019-09-29 NOTE — ED Notes (Signed)
[facility] transported pt to neuro ICU at [facility]

## 2019-09-29 NOTE — ED Triage Notes (Signed)
Pt dropped off by son for right sided weakness/numbness, LSW 2100. Pt reports numbness to arm and leg, minimal effort against gravity upon initial assessment. Cardama at bedside. Pt to CT

## 2019-09-29 NOTE — ED Provider Notes (Signed)
[facility] NEURO/TRAUMA/SURGICAL ICU Provider Note  CSN: [id number] Arrival date & time: 09/29/19 0001  Chief Complaint(s) Code Stroke  HPI Monique Holland is a 54 y.o. female with extensive past medical history listed below who presents to the emergency department with sudden onset right upper and lower extremity numbness and weakness; onset approximately 2100 p.m.  She denies any visual or speech changes.  No loss of consciousness.  Patient has some nausea related to the incident without emesis.     Cerebrovascular Accident This is a new problem. Episode onset: 2100. The problem occurs constantly. The problem has not changed since onset.Pertinent negatives include no chest pain, no abdominal pain, no headaches and no shortness of breath. Nothing aggravates the symptoms. Nothing relieves the symptoms.   CODE STROKE called at triage.  Past Medical History Past Medical History:  Diagnosis Date  . Anxiety   . Asthma   . Bipolar disorder (HCC)   . Depression   . Diabetes mellitus without complication (HCC)   . Frequency of urination   . GERD (gastroesophageal reflux disease)   . History of asthma    last episode yrs ago  . Hypertension   . OSA (obstructive sleep apnea)    pt had study done oct 2014--  schedule for cpap titrate after kidney stone surgery  . Pancreatitis   . Pseudocyst of pancreas   . Schizophrenia (HCC)   . Ureteral calculi    BILATERAL  . Wears glasses    Patient Active Problem List   Diagnosis Date Noted  . Right sided weakness 09/29/2019  . Acute ischemic stroke (HCC) 09/29/2019  . History of pancreatitis   . Intractable abdominal pain 09/15/2019  . Hyperosmolar hyperglycemic state (HHS) (HCC) 09/15/2019  . Non-intractable vomiting   . Hematemesis with nausea   . Abdominal pain 08/03/2019  . Essential hypertension 08/03/2019  . Pancreatic pseudocyst   . Nausea and vomiting in adult   . Necrotizing pancreatitis   . Gastritis and gastroduodenitis   .  Candida esophagitis (HCC)   . Acute pancreatitis 07/17/2019  . Schizophrenia (HCC) 09/02/2018  . Obesity, Class III, BMI 40-49.9 (morbid obesity) (HCC) 09/02/2018  . Iron deficiency 09/02/2018  . Symptomatic anemia 09/01/2018  . Hypokalemia 09/01/2018  . Severe sepsis (HCC) 08/11/2018  . AKI (acute kidney injury) (HCC) 08/11/2018  . Hypotension 08/11/2018  . Type 2 diabetes mellitus (HCC) 08/11/2018  . Acute appendicitis 05/22/2014  . Ureteral calculus, right 05/02/2013   Home Medication(s) Prior to Admission medications   Medication Sig Start Date End Date Taking? Authorizing Provider  albuterol (VENTOLIN HFA) 108 (90 Base) MCG/ACT inhaler Inhale 1-2 puffs into the lungs every 6 (six) hours as needed for wheezing or shortness of breath. 07/21/19   Regalado, Belkys A, MD  amitriptyline (ELAVIL) 75 MG tablet Take 75 mg by mouth at bedtime. 08/10/19   [provider]  amLODipine (NORVASC) 5 MG tablet Take 5 mg by mouth daily.    [provider]  atorvastatin (LIPITOR) 40 MG tablet Take 40 mg by mouth daily.    [provider]  clonazePAM (KLONOPIN) 0.5 MG tablet Take 0.5 mg by mouth daily.  05/23/19   [provider]  fluconazole (DIFLUCAN) 200 MG tablet Take 1 tablet (200 mg total) by mouth daily for 12 days. 09/20/19 10/02/19  Narda Bonds, MD  lamoTRIgine (LAMICTAL) 200 MG tablet Take 200 mg by mouth daily. 06/06/19   [provider]  LANTUS SOLOSTAR 100 UNIT/ML Solostar Pen Inject  15 Units into the skin at bedtime. Patient taking differently: Inject 40 Units into the skin at bedtime.  07/21/19   Regalado, Belkys A, MD  LATUDA 60 MG TABS Take 1 tablet by mouth at bedtime. 08/10/19   [provider]  losartan (COZAAR) 25 MG tablet Take 25 mg by mouth daily.    [provider]  ondansetron (ZOFRAN) 8 MG tablet Take 1 tablet (8 mg total) by mouth every 8 (eight) hours as needed for nausea or vomiting. 08/19/19   Regalado, Belkys A,  MD  Oxycodone HCl 10 MG TABS Take 10 mg by mouth every 4 (four) hours as needed (pain).  08/12/19   [provider]  pantoprazole (PROTONIX) 40 MG tablet Take 40 mg by mouth 2 (two) times daily.    [provider]  polyethylene glycol (MIRALAX / GLYCOLAX) 17 g packet Take 17 g by mouth 2 (two) times daily. 09/19/19   Narda Bonds, MD  PREMARIN vaginal cream Place 1 Applicatorful vaginally daily as needed (irritation).  08/10/19   [provider]  propranolol (INDERAL) 20 MG tablet Take 20 mg by mouth 2 (two) times daily.    [provider]  senna (SENOKOT) 8.6 MG TABS tablet Take 1 tablet (8.6 mg total) by mouth 2 (two) times daily. Patient not taking: Reported on 09/15/2019 07/21/19   Hartley Barefoot A, MD  traZODone (DESYREL) 100 MG tablet Take 100 mg by mouth at bedtime.    [provider]  TRULICITY 4.5 MG/0.5ML SOPN Inject 4.5 mg into the skin every Wednesday.  07/29/19   [provider]                                                                                                                                    Past Surgical History Past Surgical History:  Procedure Laterality Date  . BALLOON DILATION N/A 08/23/2019   Procedure: BALLOON DILATION;  Surgeon: Meridee Score Netty Starring., MD;  Location: [facility];  Service: Gastroenterology;  Laterality: N/A;  . BIOPSY  07/18/2019   Procedure: BIOPSY;  Surgeon: Shellia Cleverly, DO;  Location: [facility];  Service: Gastroenterology;;  . BIOPSY  08/23/2019   Procedure: BIOPSY;  Surgeon: Lemar Lofty., MD;  Location: [facility];  Service: Gastroenterology;;  . BIOPSY  09/18/2019   Procedure: BIOPSY;  Surgeon: Lemar Lofty., MD;  Location: [facility];  Service: Gastroenterology;;  . CESAREAN SECTION  1991  &  2002   w/ bilateral tubal ligation in 2002  . CYSTOSCOPY W/ URETERAL STENT PLACEMENT Right 08/10/2018   Procedure: CYSTOSCOPY WITH RETROGRADE PYELOGRAM/URETERAL  STENT PLACEMENT;  Surgeon: Jerilee Field, MD;  Location: [facility];  Service: Urology;  Laterality: Right;  . CYSTOSCOPY WITH RETROGRADE PYELOGRAM, URETEROSCOPY AND STENT PLACEMENT Bilateral 05/02/2013   Procedure: CYSTOSCOPY WITH RETROGRADE PYELOGRAM, URETEROSCOPY ;  Surgeon: Valetta Fuller, MD;  Location[facility];  Service: Urology;  Laterality:  Bilateral;  . CYSTOSCOPY WITH RETROGRADE PYELOGRAM, URETEROSCOPY AND STENT PLACEMENT Right 08/15/2018   Procedure: CYSTOSCOPY WITH RIGHT RETROGRADE PYELOGRAM, RIGHT URETEROSCOPY WITH HOLMIUM LASER AND STENT PLACEMENT;  Surgeon: Crista Elliot, MD;  Location: [facility];  Service: Urology;  Laterality: Right;  . CYSTOSCOPY WITH STENT PLACEMENT Left 05/02/2013   Procedure: CYSTOSCOPY WITH STENT PLACEMENT;  Surgeon: Valetta Fuller, MD;  Location: [facility];  Service: Urology;  Laterality: Left;  . ENDOROTOR N/A 08/26/2019   Procedure: [id number];  Surgeon: Mansouraty, Netty Starring., MD;  Location: [facility] ENDOSCOPY;  Service: Gastroenterology;  Laterality: N/A;  . ENDOROTOR  09/05/2019   Procedure: [id number];  Surgeon: Mansouraty, Netty Starring., MD;  Location[facility] ENDOSCOPY;  Service: Gastroenterology;;  . ESOPHAGOGASTRODUODENOSCOPY N/A 08/26/2019   Procedure: ESOPHAGOGASTRODUODENOSCOPY (EGD) with NECROSECTOMY;  Surgeon: Lemar Lofty., MD;  Location: [facility] ENDOSCOPY;  Service: Gastroenterology;  Laterality: N/A;  . ESOPHAGOGASTRODUODENOSCOPY (EGD) WITH PROPOFOL N/A 07/18/2019   Procedure: ESOPHAGOGASTRODUODENOSCOPY (EGD) WITH PROPOFOL;  Surgeon: Shellia Cleverly, DO;  Location: [facility];  Service: Gastroenterology;  Laterality: N/A;  . ESOPHAGOGASTRODUODENOSCOPY (EGD) WITH PROPOFOL N/A 08/18/2019   Procedure: ESOPHAGOGASTRODUODENOSCOPY (EGD) WITH PROPOFOL;  Surgeon: Hilarie Fredrickson, MD;  Location: [facility];  Service: Endoscopy;  Laterality: N/A;  . ESOPHAGOGASTRODUODENOSCOPY (EGD) WITH PROPOFOL N/A 08/21/2019   Procedure:  ESOPHAGOGASTRODUODENOSCOPY (EGD) WITH PROPOFOL;  Surgeon: Meridee Score Netty Starring., MD;  Location: [facility];  Service: Gastroenterology;  Laterality: N/A;  . ESOPHAGOGASTRODUODENOSCOPY (EGD) WITH PROPOFOL N/A 08/23/2019   Procedure: ESOPHAGOGASTRODUODENOSCOPY (EGD) WITH PROPOFOL;  Surgeon: Meridee Score Netty Starring., MD;  Location: [facility];  Service: Gastroenterology;  Laterality: N/A;  . ESOPHAGOGASTRODUODENOSCOPY (EGD) WITH PROPOFOL N/A 09/05/2019   Procedure: ESOPHAGOGASTRODUODENOSCOPY (EGD) WITH PROPOFOL;  Surgeon: Meridee Score Netty Starring., MD;  Location: [facility] ENDOSCOPY;  Service: Gastroenterology;  Laterality: N/A;   With pancreatic pseudocyst necrosectomy via established cyst gastrostomy  . ESOPHAGOGASTRODUODENOSCOPY (EGD) WITH PROPOFOL N/A 09/18/2019   Procedure: ESOPHAGOGASTRODUODENOSCOPY (EGD) WITH PROPOFOL;  Surgeon: Meridee Score Netty Starring., MD;  Location: [facility];  Service: Gastroenterology;  Laterality: N/A;  . EUS  08/23/2019   Procedure: UPPER ENDOSCOPIC ULTRASOUND (EUS) LINEAR;  Surgeon: Lemar Lofty., MD;  Location: [facility];  Service: Gastroenterology;;  . FOREIGN BODY REMOVAL  09/05/2019   Procedure: FOREIGN BODY REMOVAL;  Surgeon: Lemar Lofty., MD;  Location: [facility] ENDOSCOPY;  Service: Gastroenterology;;  . FOREIGN BODY REMOVAL  09/18/2019   Procedure: FOREIGN BODY REMOVAL;  Surgeon: Lemar Lofty., MD;  Location: [facility];  Service: Gastroenterology;;  . HOLMIUM LASER APPLICATION Bilateral 05/02/2013   Procedure: HOLMIUM LASER APPLICATION;  Surgeon: Valetta Fuller, MD;  Location: [facility];  Service: Urology;  Laterality: Bilateral;  . LAPAROSCOPIC APPENDECTOMY N/A 05/22/2014   Procedure: APPENDECTOMY LAPAROSCOPIC;  Surgeon: Glenna Fellows, MD;  Location: [facility];  Service: General;  Laterality: N/A;  . PANCREATIC STENT PLACEMENT  08/23/2019   Procedure: PANCREATIC STENT PLACEMENT;  Surgeon: Lemar Lofty., MD;   Location: [facility];  Service: Gastroenterology;;  . PANCREATIC STENT PLACEMENT  09/05/2019   Procedure: PANCREATIC STENT PLACEMENT;  Surgeon: Lemar Lofty., MD;  Location: [facility] ENDOSCOPY;  Service: Gastroenterology;;  . PANCREATIC STENT PLACEMENT  09/18/2019   Procedure: PANCREATIC STENT PLACEMENT;  Surgeon: Lemar Lofty., MD;  Location: [facility];  Service: Gastroenterology;;  cyst gastrostomy double pigtail stent placement   . STENT REMOVAL  08/26/2019   Procedure: STENT REMOVAL;  Surgeon: Lemar Lofty., MD;  Location: [facility] ENDOSCOPY;  Service: Gastroenterology;;  . Francine Graven  REMOVAL  09/05/2019   Procedure: STENT REMOVAL;  Surgeon: Irving Copas., MD;  Location: [facility];  Service: Gastroenterology;;  . Lavell Islam REMOVAL  09/18/2019   Procedure: STENT REMOVAL;  Surgeon: Irving Copas., MD;  Location: [facility];  Service: Gastroenterology;;  . TONSILLECTOMY  age 53  . UPPER ESOPHAGEAL ENDOSCOPIC ULTRASOUND (EUS) N/A 08/21/2019   Procedure: UPPER ESOPHAGEAL ENDOSCOPIC ULTRASOUND (EUS);  Surgeon: Irving Copas., MD;  Location: [facility];  Service: Gastroenterology;  Laterality: N/A;  For cyst gastrostomy   Family History Family History  Family history unknown: Yes    Social History Social History   Tobacco Use  . Smoking status: Never Smoker  . Smokeless tobacco: Never Used  Substance Use Topics  . Alcohol use: No  . Drug use: No   Allergies Patient has no known allergies.  Review of Systems Review of Systems  Respiratory: Negative for shortness of breath.   Cardiovascular: Negative for chest pain.  Gastrointestinal: Negative for abdominal pain.  Neurological: Negative for headaches.   All other systems are reviewed and are negative for acute change except as noted in the HPI  Physical Exam Vital Signs  I have reviewed the triage vital signs BP (!) 170/105 (BP Location: Right Arm)   Pulse (!) 109   Temp 98.5 F  (36.9 C) (Oral)   Resp 14   Wt 130.1 kg   LMP 03/23/2014 Comment: (-) u preg?/ac  SpO2 100%   BMI 43.61 kg/m   Physical Exam Vitals reviewed.  Constitutional:      General: She is not in acute distress.    Appearance: She is well-developed. She is obese. She is not diaphoretic.  HENT:     Head: Normocephalic and atraumatic.     Right Ear: External ear normal.     Left Ear: External ear normal.     Nose: Nose normal.  Eyes:     General: No scleral icterus.    Conjunctiva/sclera: Conjunctivae normal.  Neck:     Trachea: Phonation normal.  Cardiovascular:     Rate and Rhythm: Normal rate and regular rhythm.  Pulmonary:     Effort: Pulmonary effort is normal. No respiratory distress.     Breath sounds: No stridor.  Abdominal:     General: There is no distension.  Musculoskeletal:        General: Normal range of motion.     Cervical back: Normal range of motion.  Neurological:     Mental Status: She is alert and oriented to person, place, and time.     Comments: Detailed neuro exam being performed by teleneuro with RN assistance.  Briefly, Strength: decreased RUE and RLE (no movement when patient is asked to move, however, she briskly retracts to painful stimuli). + Hoover's sign with right leg. 5/5 to LUE and LLE.  Sensation: subjectively reduced sensation to right extremities, though response to painful stimuli.   Psychiatric:        Behavior: Behavior normal.     ED Results and Treatments Labs (all labs ordered are listed, but only abnormal results are displayed) Labs Reviewed  CBC - Abnormal; Notable for the following components:      Result Value   Hemoglobin 9.9 (*)    HCT 32.8 (*)    MCV 71.9 (*)    MCH 21.7 (*)    All other components within normal limits  COMPREHENSIVE METABOLIC PANEL - Abnormal; Notable for the following components:   Sodium 127 (*)  Chloride 90 (*)    Glucose, Bld 595 (*)    Calcium 8.8 (*)    Albumin 3.0 (*)    All other  components within normal limits  GLUCOSE, CAPILLARY - Abnormal; Notable for the following components:   Glucose-Capillary 465 (*)    All other components within normal limits  CBG MONITORING, ED - Abnormal; Notable for the following components:   Glucose-Capillary 562 (*)    All other components within normal limits  RESPIRATORY PANEL BY RT PCR (FLU A&B, COVID)  MRSA PCR SCREENING  PROTIME-INR  APTT  DIFFERENTIAL  PREGNANCY, URINE  HEMOGLOBIN A1C  LIPID PANEL                                                                                                                         EKG  EKG Interpretation  Date/Time:  Sunday Sep 29 2019 00:21:32 EDT Ventricular Rate:  107 PR Interval:    QRS Duration: 100 QT Interval:  365 QTC Calculation: 487 R Axis:   16 Text Interpretation: Sinus tachycardia Borderline prolonged QT interval Otherwise no significant change Confirmed by Drema Pryardama, Bilal Manzer [phone]([id number]) on 09/29/2019 3:27:46 AM      Radiology CT Code Stroke CTA Head W/WO contrast  Result Date: 09/29/2019 CLINICAL DATA:  Initial evaluation for acute right-sided weakness. EXAM: CT ANGIOGRAPHY HEAD AND NECK TECHNIQUE: Multidetector CT imaging of the head and neck was performed using the standard protocol during bolus administration of intravenous contrast. Multiplanar CT image reconstructions and MIPs were obtained to evaluate the vascular anatomy. Carotid stenosis measurements (when applicable) are obtained utilizing NASCET criteria, using the distal internal carotid diameter as the denominator. CONTRAST:  100mL OMNIPAQUE IOHEXOL 350 MG/ML SOLN COMPARISON:  Prior head CT from earlier same day. FINDINGS: CTA NECK FINDINGS Aortic arch: Visualized aortic arch of normal caliber with normal branch pattern. No hemodynamically significant stenosis seen about the origin of the great vessels. Visualized subclavian arteries widely patent. Right carotid system: Right common carotid artery widely patent from its  origin to the bifurcation without stenosis. Mild eccentric calcified plaque at the right bifurcation without significant stenosis. Right ICA widely patent distally to the skull base without stenosis, dissection or occlusion. Left carotid system: Left common carotid artery widely patent from its origin to the bifurcation without stenosis. No significant atheromatous narrowing about the left bifurcation. Left ICA widely patent distally to the skull base without stenosis, dissection or occlusion. Vertebral arteries: Both vertebral arteries arise from the subclavian arteries. Vertebral arteries widely patent without stenosis, dissection or occlusion. Skeleton: No acute osseous abnormality. No discrete or worrisome osseous lesions. Other neck: No other acute soft tissue abnormality within the neck. 11 mm right thyroid nodule noted (series 4, image 69). No other mass lesion or adenopathy. Upper chest: Visualized upper chest demonstrates no acute finding. Review of the MIP images confirms the above findings CTA HEAD FINDINGS Anterior circulation: Both internal carotid arteries widely patent to the termini without stenosis. A1 segments widely patent. Normal  anterior communicating artery complex. Anterior cerebral arteries widely patent to their distal aspects. No M1 stenosis or occlusion. Normal MCA bifurcations. Distal MCA branches well perfused and symmetric. Posterior circulation: Both vertebral arteries widely patent to the vertebrobasilar junction without stenosis. Both picas patent. Basilar widely patent to its distal aspect. Superior cerebral arteries patent bilaterally. Both PCAs primarily supplied via the basilar well perfused to their distal aspects. Venous sinuses: Patent. Anatomic variants: None significant. Review of the MIP images confirms the above findings IMPRESSION: 1. Negative CTA for large vessel occlusion. 2. Mild for age atheromatous change about the right carotid bifurcation without stenosis. No other  significant atheromatous disease about the major arterial vasculature of the head and neck. No hemodynamically significant or correctable stenosis. 3. 11 mm right thyroid nodule, of doubtful significance given size and patient age. No follow-up imaging recommended. (Ref: J Am Coll Radiol. 2015 Feb;12(2): 143-50). Results were called by telephone at the time of interpretation on 09/29/2019 at 1:05 am to provider Dr. Wilford Corner, Who verbally acknowledged these results. Electronically Signed   By: Rise Mu M.D.   On: 09/29/2019 01:40   CT Code Stroke CTA Neck W/WO contrast  Result Date: 09/29/2019 CLINICAL DATA:  Initial evaluation for acute right-sided weakness. EXAM: CT ANGIOGRAPHY HEAD AND NECK TECHNIQUE: Multidetector CT imaging of the head and neck was performed using the standard protocol during bolus administration of intravenous contrast. Multiplanar CT image reconstructions and MIPs were obtained to evaluate the vascular anatomy. Carotid stenosis measurements (when applicable) are obtained utilizing NASCET criteria, using the distal internal carotid diameter as the denominator. CONTRAST:  OMNIPAQUE IOHEXOL 350 MG/ML SOLN COMPARISON:  Prior head CT from earlier same day. FINDINGS: CTA NECK FINDINGS Aortic arch: Visualized aortic arch of normal caliber with normal branch pattern. No hemodynamically significant stenosis seen about the origin of the great vessels. Visualized subclavian arteries widely patent. Right carotid system: Right common carotid artery widely patent from its origin to the bifurcation without stenosis. Mild eccentric calcified plaque at the right bifurcation without significant stenosis. Right ICA widely patent distally to the skull base without stenosis, dissection or occlusion. Left carotid system: Left common carotid artery widely patent from its origin to the bifurcation without stenosis. No significant atheromatous narrowing about the left bifurcation. Left ICA widely  patent distally to the skull base without stenosis, dissection or occlusion. Vertebral arteries: Both vertebral arteries arise from the subclavian arteries. Vertebral arteries widely patent without stenosis, dissection or occlusion. Skeleton: No acute osseous abnormality. No discrete or worrisome osseous lesions. Other neck: No other acute soft tissue abnormality within the neck. 11 mm right thyroid nodule noted (series 4, image 69). No other mass lesion or adenopathy. Upper chest: Visualized upper chest demonstrates no acute finding. Review of the MIP images confirms the above findings CTA HEAD FINDINGS Anterior circulation: Both internal carotid arteries widely patent to the termini without stenosis. A1 segments widely patent. Normal anterior communicating artery complex. Anterior cerebral arteries widely patent to their distal aspects. No M1 stenosis or occlusion. Normal MCA bifurcations. Distal MCA branches well perfused and symmetric. Posterior circulation: Both vertebral arteries widely patent to the vertebrobasilar junction without stenosis. Both picas patent. Basilar widely patent to its distal aspect. Superior cerebral arteries patent bilaterally. Both PCAs primarily supplied via the basilar well perfused to their distal aspects. Venous sinuses: Patent. Anatomic variants: None significant. Review of the MIP images confirms the above findings IMPRESSION: 1. Negative CTA for large vessel occlusion. 2. Mild for age atheromatous change  about the right carotid bifurcation without stenosis. No other significant atheromatous disease about the major arterial vasculature of the head and neck. No hemodynamically significant or correctable stenosis. 3. 11 mm right thyroid nodule, of doubtful significance given size and patient age. No follow-up imaging recommended. (Ref: J Am Coll Radiol. 2015 Feb;12(2): 143-50). Results were called by telephone at the time of interpretation on 09/29/2019 at 1:05 am to provider Dr.  Wilford Corner, Who verbally acknowledged these results. Electronically Signed   By: Rise Mu M.D.   On: 09/29/2019 01:40   CT HEAD CODE STROKE WO CONTRAST  Result Date: 09/29/2019 CLINICAL DATA:  Code stroke.  Initial evaluation for acute stroke. EXAM: CT HEAD WITHOUT CONTRAST TECHNIQUE: Contiguous axial images were obtained from the base of the skull through the vertex without intravenous contrast. COMPARISON:  None. FINDINGS: Brain: Cerebral volume within normal limits for patient age. No evidence for acute intracranial hemorrhage. No findings to suggest acute large vessel territory infarct. No mass lesion, midline shift, or mass effect. Ventricles are normal in size without evidence for hydrocephalus. No extra-axial fluid collection identified. Vascular: No hyperdense vessel identified. Skull: Scalp soft tissues demonstrate no acute abnormality. Calvarium intact. Sinuses/Orbits: Globes and orbital soft tissues within normal limits. Visualized paranasal sinuses are clear. No mastoid effusion. ASPECTS [facility] Stroke Program Early CT Score) - Ganglionic level infarction (caudate, lentiform nuclei, internal capsule, insula, M1-M3 cortex): - Supraganglionic infarction (M4-M6 cortex): Total score (0-10 with 10 being normal): IMPRESSION: 1. Negative head CT.  No acute intracranial abnormality. 2. ASPECTS is 10. Critical Value/emergent results were called by telephone at the time of interpretation on 09/29/2019 at 12:29 am to nurse practitioner Dory Peru, who verbally acknowledged these results. Electronically Signed   By: Rise Mu M.D.   On: 09/29/2019 00:29    Pertinent labs & imaging results that were available during my care of the patient were reviewed by me and considered in my medical decision making (see chart for details).  Medications Ordered in ED Medications  sodium chloride flush (NS) 0.9 % injection 3 mL (has no administration in time range)  labetalol (NORMODYNE) injection  20 mg (0 mg Intravenous Hold 09/29/19 0035)  alteplase (ACTIVASE) 1 mg/mL injection (has no administration in time range)  chlorhexidine (PERIDEX) 0.12 % solution 15 mL (has no administration in time range)  Chlorhexidine Gluconate Cloth 2 % PADS 6 each (has no administration in time range)   stroke: mapping our early stages of recovery book (has no administration in time range)  0.9 %  sodium chloride infusion ( Intravenous Rate/Dose Change 09/29/19 0256)  acetaminophen (TYLENOL) tablet 650 mg (has no administration in time range)    Or  acetaminophen (TYLENOL) 160 MG/5ML solution 650 mg (has no administration in time range)    Or  acetaminophen (TYLENOL) suppository 650 mg (has no administration in time range)  senna-docusate (Senokot-S) tablet 1 tablet (has no administration in time range)  pantoprazole (PROTONIX) injection 40 mg (has no administration in time range)  labetalol (NORMODYNE) injection 20 mg (has no administration in time range)    And  clevidipine (CLEVIPREX) infusion 0.5 mg/mL (has no administration in time range)  insulin aspart (novoLOG) injection 0-20 Units (has no administration in time range)  fluconazole (DIFLUCAN) tablet 200 mg (has no administration in time range)  atorvastatin (LIPITOR) tablet 40 mg (has no administration in time range)  amitriptyline (ELAVIL) tablet 75 mg (has no administration in time range)  lurasidone (LATUDA) tablet 60 mg (has no administration  in time range)  traZODone (DESYREL) tablet 100 mg (has no administration in time range)  ondansetron (ZOFRAN) tablet 8 mg (has no administration in time range)  clonazePAM (KLONOPIN) tablet 0.5 mg (has no administration in time range)  lamoTRIgine (LAMICTAL) tablet 200 mg (has no administration in time range)  albuterol (PROVENTIL) (2.5 MG/3ML) 0.083% nebulizer solution 3 mL (has no administration in time range)  alteplase (ACTIVASE) 1 mg/mL infusion 90 mg (90 mg Intravenous New Bag/Given 09/29/19 0039)     Followed by  0.9 %  sodium chloride infusion (50 mLs Intravenous New Bag/Given 09/29/19 0140)  iohexol (OMNIPAQUE) 350 MG/ML injection 100 mL (100 mLs Intravenous Contrast Given 09/29/19 0049)  insulin aspart (novoLOG) injection 20 Units (20 Units Subcutaneous Given 09/29/19 0255)  sodium chloride 0.9 % bolus 1,000 mL (1,000 mLs Intravenous New Bag/Given 09/29/19 0300)                                                                                                                                    Procedures .Critical Care Performed by: Nira Conn, MD Authorized by: Nira Conn, MD    CRITICAL CARE Performed by: Amadeo Garnet Larysa Pall Total critical care time: 30 minutes Critical care time was exclusive of separately billable procedures and treating other patients. Critical care was necessary to treat or prevent imminent or life-threatening deterioration. Critical care was time spent personally by me on the following activities: development of treatment plan with patient and/or surrogate as well as nursing, discussions with consultants, evaluation of patient's response to treatment, examination of patient, obtaining history from patient or surrogate, ordering and performing treatments and interventions, ordering and review of laboratory studies, ordering and review of radiographic studies, pulse oximetry and re-evaluation of patient's condition.    (including critical care time)  Medical Decision Making / ED Course I have reviewed the nursing notes for this encounter and the patient's prior records (if available in EHR or on provided paperwork).   Monique Holland was evaluated in Emergency Department on 09/29/2019 for the symptoms described in the history of present illness. She was evaluated in the context of the global COVID-19 pandemic, which necessitated consideration that the patient might be at risk for infection with the SARS-CoV-2 virus that causes COVID-19.  Institutional protocols and algorithms that pertain to the evaluation of patients at risk for COVID-19 are in a state of rapid change based on information released by regulatory bodies including the CDC and federal and state organizations. These policies and algorithms were followed during the patient's care in the ED.  CT w/o [facility] Tele neurology recommended tPA based on presentation. BP lowered with labetalol  Noted to be hyperglycemic w/o DKA. I spoke with Dr. Jerrell Belfast who requested CTA here, which was negative for occlusion.  Admitted to Neuro ICU for further w/u and management.        Final Clinical Impression(s) / ED Diagnoses Final diagnoses:  Right sided weakness      This chart was dictated using voice recognition software.  Despite best efforts to proofread,  errors can occur which can change the documentation meaning.   Nira Conn, MD 09/29/19 [phone]

## 2019-09-29 NOTE — Progress Notes (Signed)
STROKE TEAM PROGRESS NOTE   HISTORY OF PRESENT ILLNESS (per record) Monique Holland is a 54 y.o. female who was brought in as a drop off patient at [facility] with complaints of right-sided weakness with last known normal 9 PM. Evaluated by telemedicine neurology at [facility] and given IV TPA and transferred to [facility] comprehensive stroke center for post TPA care.  Monique Holland has a past medical history of hypertension, obesity, diabetes, sleep apnea, depression, bipolar schizophrenia.  Patient reports that she was in her usual state of health, few days ago when she started having episodes of hyperglycemia and hypertension that was difficult to control.  She had a ER visit on 09/24/2019 for severe hyperglycemia without DKA.  She continued to have difficulty controlling her sugars.  She reports compliance to medications to me but to the nurses she reported that she does not often check her sugars only takes her Lantus and does not adhere to the diabetic regimen. Today, she had had some nausea and feeling of not being well for most of the day but around 9 PM she had sudden onset of worsening nausea and feeling that her right arm and leg has gone numb and she could not lift it.  She reported the numbness as pinprick/ants crawling sensation and not Novocain-like numbness.  Her son drove her to the [facility] where she was evaluated by telemedicine neurology.  NIH stroke scale documented in the telemedicine consultation is a 6.  Risk benefits of TPA per the chart were discussed by telemedicine neurology, and TPA was given after noncontrast head CT did not show any bleed. CTA head and neck was done-unremarkable for LVO. Transferred over to [facility] for post TPA care. Blood pressures were slightly above the accepted range for TPA and required 1 dose of labetalol IV 10 mg. When transfer was called, I requested that Cardene drip be available should the  pressures go out of range. Denies chest pain shortness of breath.  Denies nausea vomiting at this time.  Denies abdominal pain.  Denies sick contacts. Received her Covid vaccination-Pfizer-second dose received middle of April 2021. Chart review also reveals that she has undergone upper GI endoscopy with Dr. 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 Her last hemoglobin A1c 09/17/2019 was 13.0. Denies recent stressors or anxiety.  Denies worsening depression.  Denies any suicidal or homicidal ideation. Blood glucose was 595 today prior to TPA administration. LKW: 2100 hrs. on 09/28/2019 tpa given?:  Yes-by telemedicine neurology at 00 39 hours on 09/29/2019.  Premorbid modified Rankin scale (mRS): 1   INTERVAL HISTORY She reports being about the same or actually improved after TPA other examination seems unchanged.  She did ask to discuss her update with her son which I did attempt by phone but no response.    OBJECTIVE Vitals:   09/29/19 0630 09/29/19 0700 09/29/19 0743 09/29/19 0800  BP: (!) 135/91 129/84  (!) 144/105  Pulse: (!) 103 (!) 103  (!) 103  Resp: 15 14  14   Temp:   98.8 F (37.1 C)   TempSrc:   Oral   SpO2: 97% 97%  98%  Weight:      Height:        CBC:  Recent Labs  Lab 09/24/19 1315 09/24/19 1315 09/24/19 1321 09/29/19 0018  WBC 6.5  --   --  7.1  NEUTROABS 3.9  --   --  4.3  HGB 9.6*   < > 11.6* 9.9*  HCT 32.2*   < > 34.0* 32.8*  MCV 71.9*  --   --  71.9*  PLT 323  --   --  312   < > = values in this interval not displayed.    Basic Metabolic Panel:  Recent Labs  Lab 09/24/19 1315 09/24/19 1315 09/24/19 1321 09/29/19 0018  NA 127*   < > 128* 127*  K 4.1   < > 4.0 3.8  CL 91*  --   --  90*  CO2 23  --   --  25  GLUCOSE 578*  --   --  595*  BUN 7  --   --  10  CREATININE 0.80  --   --  0.94  CALCIUM 8.7*  --   --  8.8*   < > = values in this interval not displayed.     Lipid Panel: No results found for: CHOL, TRIG, HDL, CHOLHDL, VLDL, LDLCALC HgbA1c:  Lab Results  Component Value Date   HGBA1C 13.0 (H) 09/17/2019   Urine Drug Screen: No results found for: LABOPIA, COCAINSCRNUR, LABBENZ, AMPHETMU, THCU, LABBARB  Alcohol Level No results found for: [place] Code Stroke CTA Head W/WO contrast  CT Code Stroke CTA Neck W/WO contrast 09/29/2019 IMPRESSION:  1. Negative CTA for large vessel occlusion.  2. Mild for age atheromatous change about the right carotid bifurcation without stenosis. No other significant atheromatous disease about the major arterial vasculature of the head and neck. No hemodynamically significant or correctable stenosis.  3. 11 mm right thyroid nodule, of doubtful significance given size and patient age. No follow-up imaging recommended. (Ref: J Am Coll Radiol. 2015 Feb;12(2): 143-50).   CT HEAD CODE STROKE WO CONTRAST 09/29/2019 IMPRESSION:  1. Negative head CT.  No acute intracranial abnormality.  2. ASPECTS is 10.    Transthoracic Echocardiogram  00/00/2021 Pending   ECG - ST rate 107 BPM. (See cardiology reading for complete details)   PHYSICAL EXAM Blood pressure (!) 144/105, pulse (!) 103, temperature 98.8 F (37.1 C), temperature source Oral, resp. rate 14, height 5\' 8"  (1.727 m), weight 130.7 kg, last menstrual period 03/23/2014, SpO2 98 %.   GENERAL: Alert uncomfortable but fine.  HEENT: Normal  ABDOMEN: soft  EXTREMITIES: No edema   BACK: Normal  SKIN: Normal by inspection.    MENTAL STATUS: Alert and oriented. Speech, language and cognition are generally intact. Judgment and insight normal.   CRANIAL NERVES: Pupils are equal, round and reactive to light and accomodation; extra ocular movements are full, there is no significant nystagmus; visual fields are full; upper and lower facial muscles are normal in strength and symmetric, there is no flattening of the nasolabial folds; tongue is  midline; uvula is midline; shoulder elevation is normal.  MOTOR: Normal tone, bulk and strength on the left side.  Right upper extremity 4 -/5 and right lower extremity 2/5.  COORDINATION: Left finger to nose is normal, right finger to nose is normal, No rest tremor; no intention tremor; no postural tremor; no bradykinesia.  REFLEXES: Deep tendon reflexes are symmetrical and normal. Plantar reflexes are flexor bilaterally.   SENSATION: Reduced sensation to light touch right upper extremity and right leg.           ASSESSMENT/PLAN Monique Holland is a 54 y.o. female with history of hypertension, obesity, diabetes (poorly controlled), sleep apnea, asthma, anxiety, depression, bipolar schizophrenia, recent upper  endoscopy (Dr. 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 At [facility] per telemedicine neurology pt received IV tPA 0039 hrs on Sunday 09/29/19.  Suspected stroke: MRI pending  Resultant right hemiparesis  Code Stroke CT Head - Negative head CT.  No acute intracranial abnormality. ASPECTS is 10.      CT head - not ordered  MRI head - pending  MRA head - not ordered  CTA H&N - No hemodynamically significant or correctable stenosis.   CT Perfusion - not ordered  Carotid Doppler - CTA neck performed - carotid dopplers not indicated.  2D Echo - pending  Loyal Jacobson Virus 2 - negative  LDL - pending  HgbA1c - pending  UDS - pending  VTE prophylaxis - SCDs Diet  Diet Order            Diet NPO time specified  Diet effective now              No antithrombotic prior to admission, now on No antithrombotic s/p tPA  Patient will be counseled to be compliant with her antithrombotic medications  Ongoing aggressive stroke  risk factor management  Therapy recommendations:  pending  Disposition:  Pending  Hypertension  Home BP meds: Norvasc ; Cozaar ; Inderal  Current BP meds:  Cleviprex ; Labetalol prn  Stable  SBP goal < 180 s/p tPA . Permissive hypertension but gradually normalize in 5-7 days  . Long-term BP goal normotensive  Hyperlipidemia  Home Lipid lowering medication: Lipitor 40 mg daily  LDL pending, goal < 70  Current lipid lowering medication: Lipitor 40 mg daily   Continue statin at discharge  Diabetes  Home diabetic meds: Trulicity ; insulin  Current diabetic meds: SSI   HgbA1c pending, goal < 7.0 Recent Labs    09/29/19 0248 09/29/19 0603 09/29/19 0742  GLUCAP 465* 329* 305*    Other Stroke Risk Factors  Obesity, Body mass index is 43.81 kg/m., recommend weight loss, diet and exercise as appropriate   Family hx - unknown  Obstructive sleep apnea, not yet on CPAP at home.  Other Active Problems  Code status - Full code  Candida esophagitis - fluconazole  Bipolar schizophrenia   Anemia - Hb - 9.9 (microcytic indices - check iron)  Hyponatremia - 127  11 mm right thyroid nodule, of doubtful significance given size and patient age. No follow-up imaging recommended per Radiology.    Hospital day # 0    To contact Stroke Continuity provider, please refer to [email or web address]. After hours, contact General Neurology

## 2019-09-29 NOTE — Progress Notes (Signed)
*  Echocardiogram 2D Echocardiogram has been performed.  Pieter Partridge 09/29/2019, 9:40 AM

## 2019-09-29 NOTE — ED Notes (Signed)
Dr. Eudelia Bunch aware of elevated blood sugar of 595.

## 2019-09-29 NOTE — Progress Notes (Signed)
Code Stroke times:   0009 called  0012 exam began 0013 exam ended 0014 images sent 0015 GR called, exam completed

## 2019-09-29 NOTE — ED Notes (Addendum)
Pt to CT with Sheralyn Boatman, RN for CTA. [facility] here to transport pt when CTA is completed.

## 2019-09-30 ENCOUNTER — Inpatient Hospital Stay (HOSPITAL_COMMUNITY): Payer: Medicare Other

## 2019-09-30 DIAGNOSIS — I639 Cerebral infarction, unspecified: Secondary | ICD-10-CM

## 2019-09-30 LAB — CBC
HCT: 29.8 % — ABNORMAL LOW (ref 36.0–46.0)
Hemoglobin: 8.9 g/dL — ABNORMAL LOW (ref 12.0–15.0)
MCH: 21.8 pg — ABNORMAL LOW (ref 26.0–34.0)
MCHC: 29.9 g/dL — ABNORMAL LOW (ref 30.0–36.0)
MCV: 73 fL — ABNORMAL LOW (ref 80.0–100.0)
Platelets: 245 10*3/uL (ref 150–400)
RBC: 4.08 MIL/uL (ref 3.87–5.11)
RDW: 14.9 % (ref 11.5–15.5)
WBC: 4.9 10*3/uL (ref 4.0–10.5)
nRBC: 0 % (ref 0.0–0.2)

## 2019-09-30 LAB — GLUCOSE, CAPILLARY
Glucose-Capillary: 202 mg/dL — ABNORMAL HIGH (ref 70–99)
Glucose-Capillary: 211 mg/dL — ABNORMAL HIGH (ref 70–99)
Glucose-Capillary: 239 mg/dL — ABNORMAL HIGH (ref 70–99)
Glucose-Capillary: 257 mg/dL — ABNORMAL HIGH (ref 70–99)
Glucose-Capillary: 282 mg/dL — ABNORMAL HIGH (ref 70–99)
Glucose-Capillary: 295 mg/dL — ABNORMAL HIGH (ref 70–99)
Glucose-Capillary: 320 mg/dL — ABNORMAL HIGH (ref 70–99)

## 2019-09-30 LAB — BASIC METABOLIC PANEL
Anion gap: 8 (ref 5–15)
BUN: 8 mg/dL (ref 6–20)
CO2: 24 mmol/L (ref 22–32)
Calcium: 8.6 mg/dL — ABNORMAL LOW (ref 8.9–10.3)
Chloride: 104 mmol/L (ref 98–111)
Creatinine, Ser: 0.68 mg/dL (ref 0.44–1.00)
GFR calc Af Amer: >60 mL/min (ref >60)
GFR calc non Af Amer: >60 mL/min (ref >60)
Glucose, Bld: 216 mg/dL — ABNORMAL HIGH (ref 70–99)
Potassium: 3.4 mmol/L — ABNORMAL LOW (ref 3.5–5.1)
Sodium: 136 mmol/L (ref 135–145)

## 2019-09-30 LAB — IRON AND TIBC
Iron: 27 ug/dL — ABNORMAL LOW (ref 28–170)
Saturation Ratios: 12 % (ref 10.4–31.8)
TIBC: 221 ug/dL — ABNORMAL LOW (ref 250–450)
UIBC: 194 ug/dL

## 2019-09-30 IMAGING — MR MR HEAD W/O CM
13 of 14 series · 44 of 48 positions shown · non-contrast
Comparison: Prior CT and CTA from [DATE].

CLINICAL DATA: Follow-up examination for acute stroke.

EXAM:
MRI HEAD WITHOUT CONTRAST
TECHNIQUE: Multiplanar, multiecho pulse sequences of the brain and surrounding
structures were obtained without intravenous contrast.

[Series 5: DWI · axial · 3.0mm · 0.88mm/px · z∈[-143,-5]mm · 7 of 96 slices shown (1 of 4)]
[im 1/96]
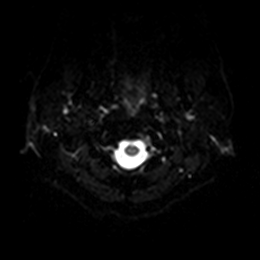
[im 16/96]
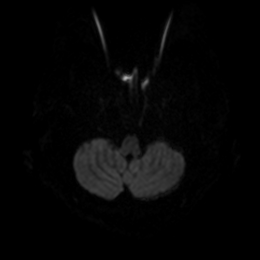
[im 32/96]
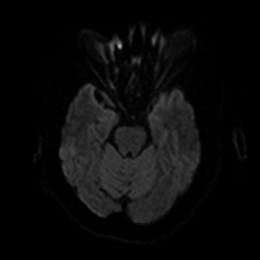
[im 48/96]
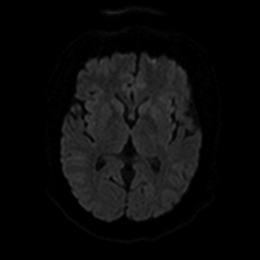
[im 64/96]
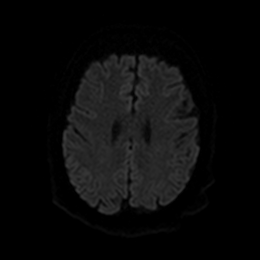
[im 80/96]
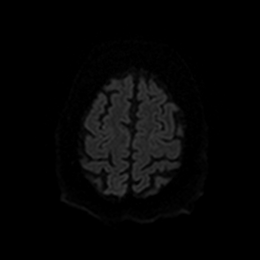
[im 96/96]
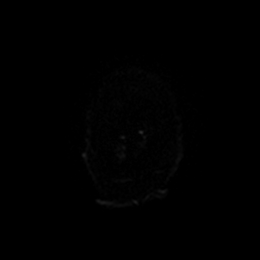

[Series 6: DWI · axial · 3.0mm · 0.88mm/px · z∈[-143,-5]mm · 4 of 48 slices shown (2 of 4)]
[im 1/48]
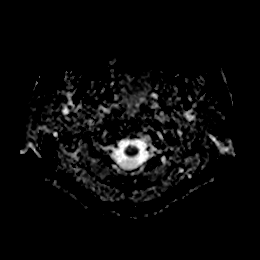
[im 16/48]
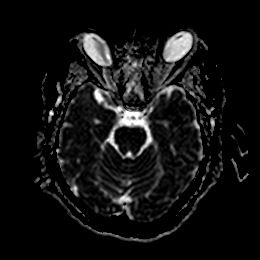
[im 32/48]
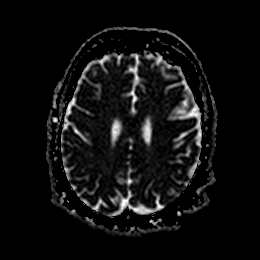
[im 48/48]
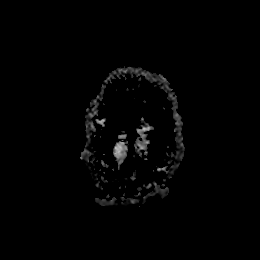

[Series 7: T1 · sagittal · 5.0mm · 0.75mm/px · 2 of 25 slices shown]
[im 1/25]
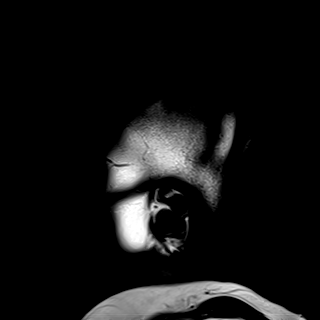
[im 25/25]
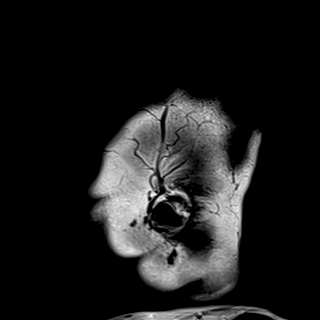

[Series 8: DWI · coronal · 4.0mm · 0.88mm/px · 5 of 65 slices shown (3 of 4)]
[im 1/65]
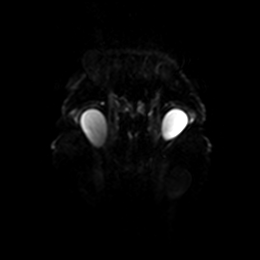
[im 17/65]
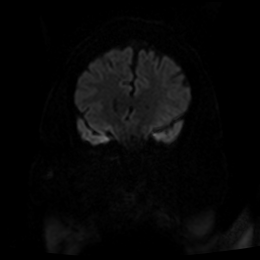
[im 33/65]
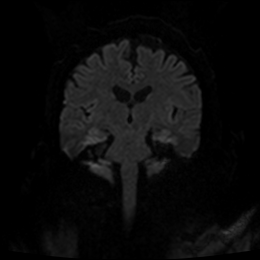
[im 49/65]
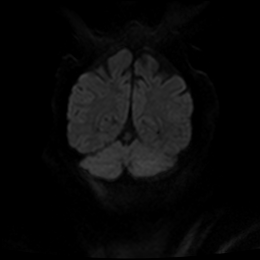
[im 65/65]
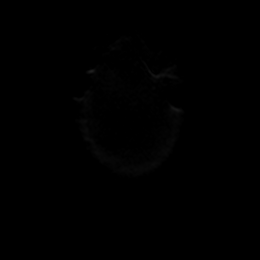

[Series 9: DWI · coronal · 4.0mm · 0.88mm/px · 3 of 33 slices shown (4 of 4)]
[im 1/33]
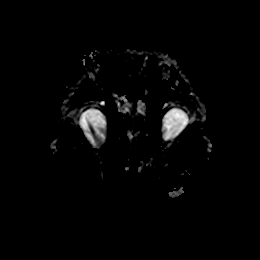
[im 17/33]
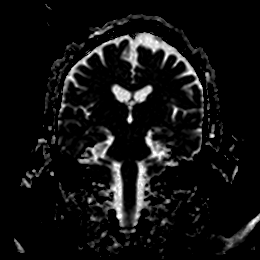
[im 33/33]
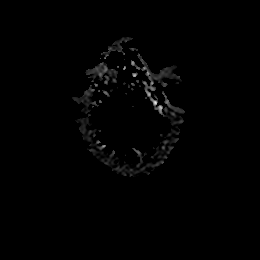

[Series 10: T2 · axial · 5.0mm · 0.72mm/px · z∈[-134,+15]mm · 2 of 26 slices shown (1 of 2)]
[im 1/26]
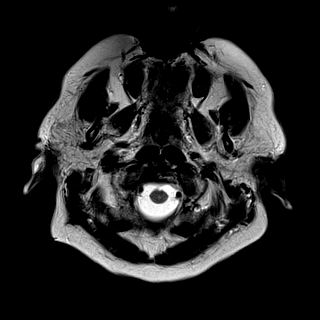
[im 26/26]
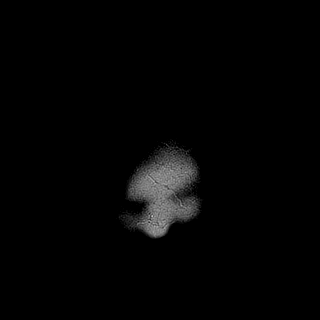

[Series 11: FLAIR · axial · 5.0mm · 0.90mm/px · z∈[-134,+15]mm · 2 of 26 slices shown (1 of 2)]
[im 1/26]
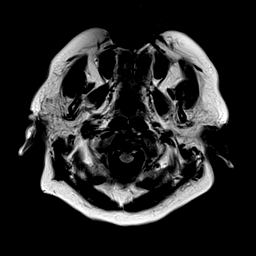
[im 26/26]
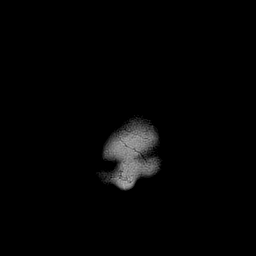

[Series 12: mag_images · axial · 3.0mm · 0.90mm/px · z∈[-136,+15]mm · 4 of 52 slices shown]
[im 1/52]
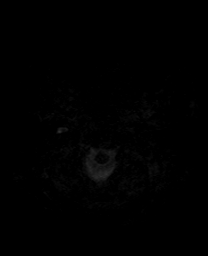
[im 18/52]
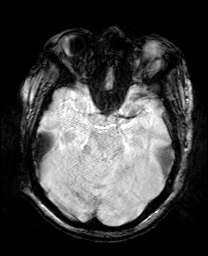
[im 35/52]
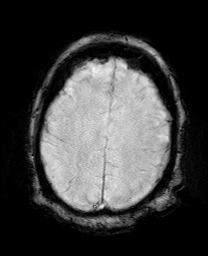
[im 52/52]
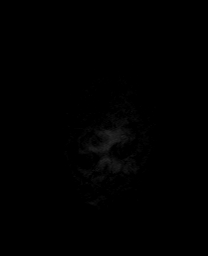

[Series 13: pha_images · axial · 3.0mm · 0.90mm/px · z∈[-136,+15]mm · 4 of 52 slices shown]
[im 1/52]
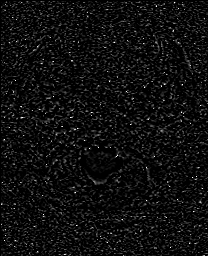
[im 18/52]
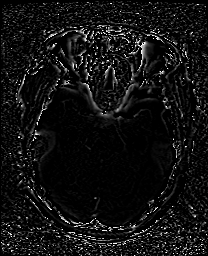
[im 35/52]
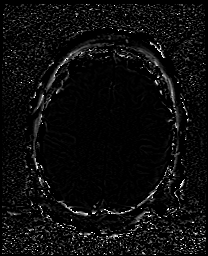
[im 52/52]
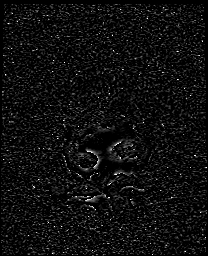

[Series 14: swi_images · axial · 3.0mm · 0.90mm/px · z∈[-136,+15]mm · 4 of 52 slices shown]
[im 1/52]
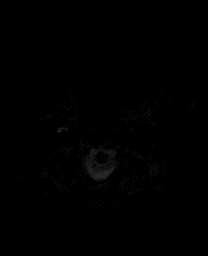
[im 18/52]
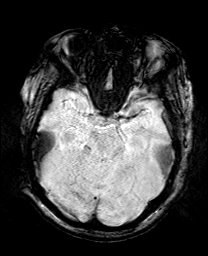
[im 35/52]
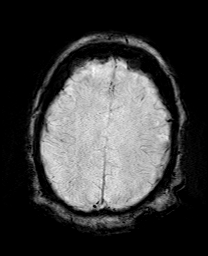
[im 52/52]
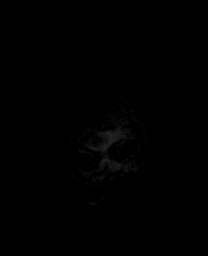

[Series 15: mip_images(sw) · axial · 24.0mm · 0.90mm/px · z∈[-126,+5]mm · 3 of 45 slices shown]
[im 1/45]
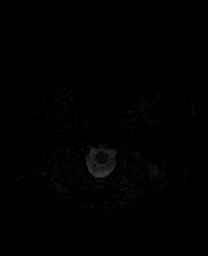
[im 23/45]
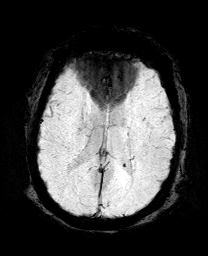
[im 45/45]
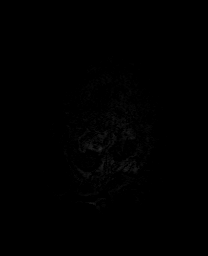

[Series 17: T2 · coronal · 5.0mm · 0.34mm/px · 2 of 29 slices shown (2 of 2)]
[im 1/29]
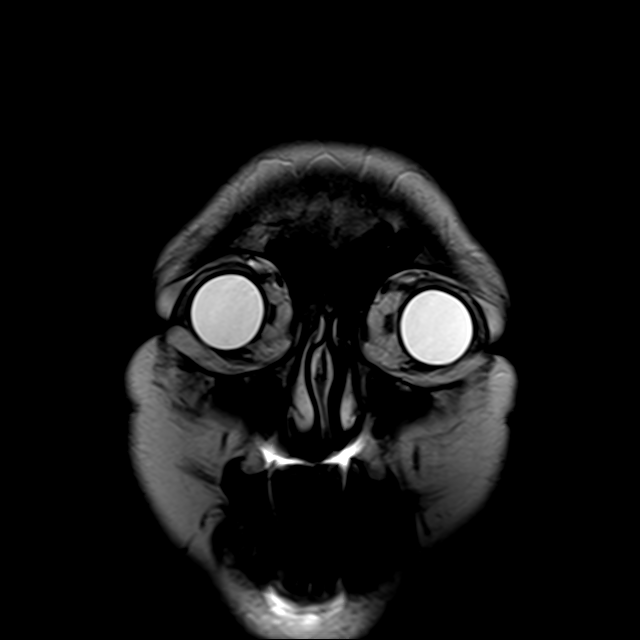
[im 29/29]
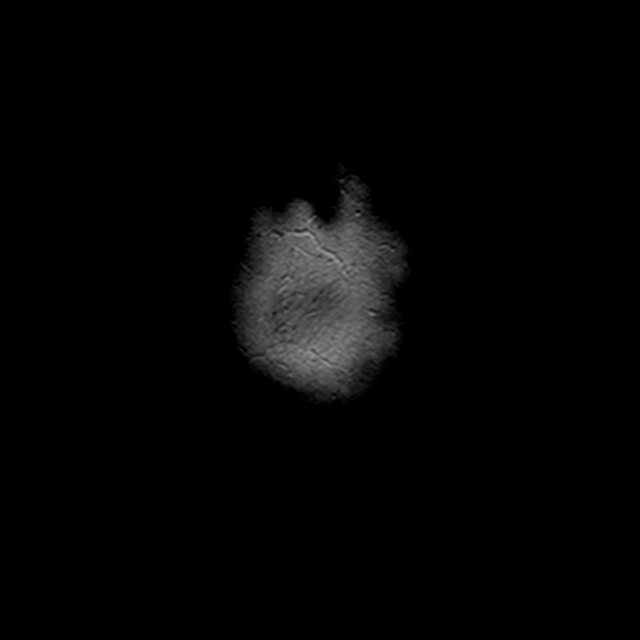

[Series 18: FLAIR · axial · 5.0mm · 0.90mm/px · z∈[-134,+15]mm · 2 of 26 slices shown (2 of 2)]
[im 1/26]
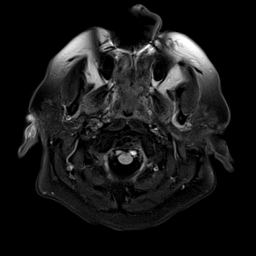
[im 26/26]
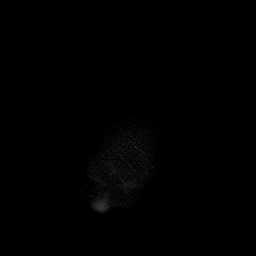

[44 of 48 positions shown; findings below may reference images not displayed]

FINDINGS: Brain: Cerebral volume within normal limits for patient age. No
significant cerebral white matter disease for age.

No abnormal foci of restricted diffusion to suggest acute or
subacute ischemia. Gray-white matter differentiation well
maintained. No encephalomalacia to suggest chronic infarction. No
foci of susceptibility artifact to suggest acute or chronic
intracranial hemorrhage.

No mass lesion, midline shift or mass effect. No hydrocephalus. No
extra-axial fluid collection. Major dural sinuses are grossly
patent.

Note made of a partially empty sella. Midline structures intact and
normal.

Vascular: Major intracranial vascular flow voids well maintained and
normal in appearance.

Skull and upper cervical spine: Craniocervical junction normal.
Visualized upper cervical spine within normal limits. Bone marrow
signal intensity normal. No scalp soft tissue abnormality.

Sinuses/Orbits: Globes and orbital soft tissues within normal
limits.

Paranasal sinuses are clear. No mastoid effusion. Inner ear
structures normal.

Other: None.
IMPRESSION: Normal brain MRI.  No acute intracranial abnormality identified.

## 2019-09-30 MED ORDER — PANTOPRAZOLE SODIUM 40 MG PO TBEC
40.0000 mg | DELAYED_RELEASE_TABLET | Freq: Every day | ORAL | Status: DC
  Administered 2019-09-30 – 2019-10-01 (×2): 40 mg via ORAL
  Filled 2019-09-30 (×2): qty 1

## 2019-09-30 MED ORDER — POTASSIUM CHLORIDE CRYS ER 20 MEQ PO TBCR
40.0000 meq | EXTENDED_RELEASE_TABLET | Freq: Once | ORAL | Status: AC
  Administered 2019-09-30: 40 meq via ORAL
  Filled 2019-09-30: qty 2

## 2019-09-30 MED ORDER — ASPIRIN 325 MG PO TABS
325.0000 mg | ORAL_TABLET | Freq: Every day | ORAL | Status: DC
  Administered 2019-09-30: 04:00:00 325 mg via ORAL
  Filled 2019-09-30 (×2): qty 1

## 2019-09-30 MED ORDER — INSULIN GLARGINE 100 UNIT/ML ~~LOC~~ SOLN
15.0000 [IU] | Freq: Every day | SUBCUTANEOUS | Status: DC
  Administered 2019-09-30: 22:00:00 15 [IU] via SUBCUTANEOUS
  Filled 2019-09-30 (×2): qty 0.15

## 2019-09-30 MED ORDER — ASPIRIN EC 81 MG PO TBEC
81.0000 mg | DELAYED_RELEASE_TABLET | Freq: Every day | ORAL | Status: DC
  Administered 2019-10-01: 10:00:00 81 mg via ORAL
  Filled 2019-09-30: qty 1

## 2019-09-30 NOTE — Progress Notes (Signed)
Occupational Therapy Evaluation  PTA, pt independent with lived at home with her husband and son who can provide 24/7 S at [facility]. Pt complaining of R sided numbness, leg worse than arm. Pt with significant difficulty moving RLE however able to ambulate and stand at sink for grooming without knee buckling with min A for safety @ RW level. Overall minguard for ADL.Marland Kitchen At this time recommend [place]. Will follow acutely to facilitate safe DC home.     09/30/19 1200  OT Visit Information  Last OT Received On 09/30/19  Assistance Needed +1  PT/OT/SLP Co-Evaluation/Treatment Yes  Reason for Co-Treatment For patient/therapist safety  OT goals addressed during session ADL's and self-care  History of Present Illness  Monique Holland is a 54 y.o. female who was brought in as a drop off patient at [facility] with complaints of right-sided weakness with last known normal 9 PM.  Given IV TPA and transferred to [facility] hospitial. MRI - negative. PMH: HTN, obesity, diabetes, sleep apnea, depression, bipolar schizophrenia.  Precautions  Precautions Fall  Restrictions  Weight Bearing Restrictions No  Home Living  Family/patient expects to be discharged to: Private residence  Living Arrangements Spouse/significant other;Children  Available Help at Discharge Family;Available 24 hours/day  Type of Home Apartment  Home Access Level entry  Home Layout One level  Bathroom Biomedical scientist Yes  How Accessible Accessible via walker  [place] - single point;BSC  Prior Function  Level of Independence Independent  Comments On disability  Communication  Communication No difficulties  Pain Assessment  Pain Assessment No/denies pain  Cognition  Arousal/Alertness Awake/alert  Behavior During Therapy Flat affect  Overall Cognitive Status Within Functional Limits for tasks assessed  General Comments suspect pts mil  Upper Extremity  Assessment  Upper Extremity Assessment Overall WFL for tasks assessed  RUE Deficits / Details using it functionally however reports altered sensation  Lower Extremity Assessment  Lower Extremity Assessment Defer to PT evaluation  RLE Deficits / Details questionable effort, able to weight bear without buckling  RLE Sensation decreased light touch (able to feel noxious stimuli)  Cervical / Trunk Assessment  Cervical / Trunk Assessment Normal  ADL  Overall ADL's  Needs assistance/impaired  Eating/Feeding Modified independent  Grooming Standing;Supervision/safety  Upper Body Bathing Set up;Sitting  Lower Body Bathing Min guard;Sit to/from stand  Upper Body Dressing  Supervision/safety;Set up;Sitting  Lower Body Dressing Min guard;Sit to/from stand  Lower Body Dressing Details (indicate cue type and reason) Initially unablet o lift leg to donn sock, then lifted leg with assiwtance of picking it up with LUE and crossed it over L leg. leg did not drop or fall off   Toilet Transfer Minimal assistance;Ambulation;RW  Toileting- Forensic psychologist Min guard;Sit to/from stand  Functional mobility during ADLs Minimal assistance;Rolling walker;Cueing for safety (initially)  Vision- History  Baseline Vision/History Wears glasses  Wears Glasses Reading only  Patient Visual Report Blurring of vision (no difficulty seeing phone or using iPad)  Vision- Assessment  Vision Assessment? Yes  Eye Alignment WFL  Ocular Range of Motion [facility]  Alignment/Gaze Preference WDL  Tracking/Visual Pursuits Able to track stimulus in all quads without difficulty  [facility]  Convergence WFL  Perception  Comments no apparent deficits  Praxis  Praxis tested? WFL  Bed Mobility  Overal bed mobility Needs Assistance  Bed Mobility Supine to Sit  Supine to sit Supervision  General bed mobility comments HOB elevated,  pt able to bring LEs off EOB, no physical assist needed  Transfers  Overall transfer  level Needs assistance  Equipment used Rolling walker (2 wheeled)  Transfers Sit to/from Stand  Sit to Stand Min guard  General transfer comment min guard for safety due to report of numbness in R LE however no physical assist needed. verbal cues for safe hand placement to push up from bed and reach back for arm rest of chair  Balance  Overall balance assessment Needs assistance  Sitting-balance support Feet supported;No upper extremity supported  Sitting balance-Leahy Scale Good  Sitting balance - Comments pt able to don socks at EOB without LOB  Standing balance support During functional activity  Standing balance-Leahy Scale Fair  Standing balance comment observed pt standing at sink with OT completing ADls with unilateral UE support  OT - End of Session  Equipment Utilized During Treatment Gait belt;Rolling walker  Activity Tolerance Patient tolerated treatment well  Patient left in chair;with call bell/phone within reach;with chair alarm set  Nurse Communication Mobility status  OT Assessment  OT Recommendation/Assessment Patient needs continued OT Services  OT Visit Diagnosis Unsteadiness on feet (R26.81);Muscle weakness (generalized) (M62.81);Other (comment) (impaired sensation)  OT Problem List Impaired balance (sitting and/or standing);Decreased knowledge of use of DME or AE;Impaired sensation;Obesity  OT Plan  OT Frequency (ACUTE ONLY) Min 2X/week  OT Treatment/Interventions (ACUTE ONLY) Self-care/ADL training;Therapeutic exercise;Neuromuscular education;DME and/or AE instruction;Therapeutic activities;Cognitive remediation/compensation;Patient/family education;Balance training  AM-PAC OT "6 Clicks" Daily Activity Outcome Measure (Version 2)  Help from another person eating meals? 4  Help from another person taking care of personal grooming? 3  Help from another person toileting, which includes using toliet, bedpan, or urinal? 3  Help from another person bathing (including  washing, rinsing, drying)? 3  Help from another person to put on and taking off regular upper body clothing? 3  Help from another person to put on and taking off regular lower body clothing? 3  6 Click Score 19  OT Recommendation  Follow Up Recommendations Home health OT;Supervision/Assistance - 24 hour (initially)  OT Equipment Other (comment) (RW)  Individuals Consulted  Consulted and Agree with Results and Recommendations Patient  Acute Rehab OT Goals  Patient Stated Goal for numbness to go away  OT Goal Formulation With patient  Time For Goal Achievement 10/14/19  Potential to Achieve Goals Good  OT Time Calculation  OT Start Time (ACUTE ONLY) 0942  OT Stop Time (ACUTE ONLY) 1027  OT Time Calculation (min) 45 min  OT General Charges  $OT Visit 1 Visit  OT Evaluation  $OT Eval Moderate Complexity 1 Mod  OT Treatments  $Self Care/Home Management  8-22 mins  Written Expression  Dominant Hand Right  Luisa Dago, OT/L   Acute OT Clinical Specialist Acute Rehabilitation Services Pager [phone] Office [phone]

## 2019-09-30 NOTE — Progress Notes (Signed)
SLP Cancellation Note  Patient Details Name: Brannon Decaire MRN: [id number] DOB: 09/05/1965   Cancelled treatment:       Reason Eval/Treat Not Completed: SLP screened, no needs identified, will sign off. MRI negative. No report of speech deficits noted in chart. Will defer evaluation. Please reorder if needed.    Aylah Yeary, Riley Nearing 09/30/2019, 8:02 AM

## 2019-09-30 NOTE — Evaluation (Signed)
Physical Therapy Evaluation Patient Details Name: Mikael Debell MRN: [id number] DOB: 12/23/1965 Today's Date: 09/30/2019   History of Present Illness   Amilliana Hayworth is a 53 y.o. female who was brought in as a drop off patient at [facility] with complaints of right-sided weakness with last known normal 9 PM.  Given IV TPA and transferred to [facility]. MRI - negative. PMH: HTN, obesity, diabetes, sleep apnea, depression, bipolar schizophrenia.    Clinical Impression  Pt admitted with above. Suspect pt at baseline level of cognitive function. Despite report by patient of R LE numbness pt able to stand and ambulate with RW without R knee buckling. Pt able to advance R LE without difficulty during ambulation. Pt denied sensation during sensation testing on R LE but did report sensation to noxious stimuli on  R LE. Recommending HHPT to progress pt from RW back to independence. Acute PT to cont to follow.    Follow Up Recommendations Home health PT;Supervision/Assistance - 24 hour    Equipment Recommendations  Rolling walker with 5" wheels    Recommendations for Other Services       Precautions / Restrictions Precautions Precautions: Fall Restrictions Weight Bearing Restrictions: No      Mobility  Bed Mobility Overal bed mobility: Needs Assistance Bed Mobility: Supine to Sit     Supine to sit: Supervision     General bed mobility comments: HOB elevated, pt able to bring LEs off EOB, no physical assist needed  Transfers Overall transfer level: Needs assistance Equipment used: Rolling walker (2 wheeled) Transfers: Sit to/from Stand Sit to Stand: Min guard         General transfer comment: min guard for safety due to report of numbness in R LE however no physical assist needed. verbal cues for safe hand placement to push up from bed and reach back for arm rest of chair  Ambulation/Gait Ambulation/Gait assistance: Min assist;+2 safety/equipment Gait  Distance (Feet): 150 Feet Assistive device: Rolling walker (2 wheeled) Gait Pattern/deviations: Step-through pattern;Decreased stride length;Narrow base of support Gait velocity: decreased Gait velocity interpretation: 1.31 - 2.62 ft/sec, indicative of limited community ambulator General Gait Details: verbal cues to stay in walker and not push it to far in front of self, no R Knee buckling or instability noted, increased bilat UE support on RW, 2nd person for chair follow for safety but didn't need it  Stairs            Wheelchair Mobility    Modified Rankin (Stroke Patients Only) Modified Rankin (Stroke Patients Only) Pre-Morbid Rankin Score: No symptoms Modified Rankin: Slight disability     Balance Overall balance assessment: Needs assistance Sitting-balance support: Feet supported;No upper extremity supported Sitting balance-Leahy Scale: Good Sitting balance - Comments: pt able to don socks at EOB without LOB   Standing balance support: During functional activity Standing balance-Leahy Scale: Fair Standing balance comment: observed pt standing at sink with OT completing ADls with unilateral UE support                             Pertinent Vitals/Pain Pain Assessment: No/denies pain    Home Living Family/patient expects to be discharged to:: Private residence Living Arrangements: Spouse/significant other;Children Available Help at Discharge: Family;Available 24 hours/day Type of Home: Apartment Home Access: Level entry     Home Layout: One level Home Equipment: Cane - single point;Bedside commode      Prior Function Level  of Independence: Independent         Comments: On disability     Hand Dominance   Dominant Hand: Right    Extremity/Trunk Assessment   Upper Extremity Assessment Upper Extremity Assessment: Defer to OT evaluation RUE Deficits / Details: using it functionally however reports altered sensation    Lower Extremity  Assessment Lower Extremity Assessment: RLE deficits/detail RLE Deficits / Details: questionable effort, able to weight bear without buckling RLE Sensation: decreased light touch(able to feel noxious stimuli)    Cervical / Trunk Assessment Cervical / Trunk Assessment: Normal  Communication   Communication: No difficulties  Cognition Arousal/Alertness: Awake/alert Behavior During Therapy: Flat affect Overall Cognitive Status: Within Functional Limits for tasks assessed                                 General Comments: suspect pts mil      General Comments General comments (skin integrity, edema, etc.): VSS, HR increased to 122 during mobility    Exercises     Assessment/Plan    PT Assessment Patient needs continued PT services  PT Problem List Decreased strength;Decreased range of motion;Decreased activity tolerance;Decreased balance;Decreased mobility;Decreased coordination;Decreased cognition;Decreased knowledge of use of DME       PT Treatment Interventions DME instruction;Gait training;Functional mobility training;Stair training;Therapeutic activities;Therapeutic exercise;Balance training;Neuromuscular re-education    PT Goals (Current goals can be found in the Care Plan section)  Acute Rehab PT Goals PT Goal Formulation: With patient Time For Goal Achievement: 10/14/19 Potential to Achieve Goals: Good    Frequency Min 4X/week   Barriers to discharge        Co-evaluation PT/OT/SLP Co-Evaluation/Treatment: Yes Reason for Co-Treatment: To address functional/ADL transfers PT goals addressed during session: Mobility/safety with mobility         AM-PAC PT "6 Clicks" Mobility  Outcome Measure Help needed turning from your back to your side while in a flat bed without using bedrails?: None Help needed moving from lying on your back to sitting on the side of a flat bed without using bedrails?: None Help needed moving to and from a bed to a chair  (including a wheelchair)?: A Little Help needed standing up from a chair using your arms (e.g., wheelchair or bedside chair)?: A Little Help needed to walk in hospital room?: A Little Help needed climbing 3-5 steps with a railing? : A Little 6 Click Score: 20    End of Session Equipment Utilized During Treatment: Gait belt Activity Tolerance: Patient tolerated treatment well Patient left: in chair;with chair alarm set;with call bell/phone within reach Nurse Communication: Mobility status PT Visit Diagnosis: Muscle weakness (generalized) (M62.81);Difficulty in walking, not elsewhere classified (R26.2)    Time: 7544-9201 PT Time Calculation (min) (ACUTE ONLY): 36 min   Charges:   PT Evaluation $PT Eval Moderate Complexity: 1 Mod PT Treatments $Gait Training: 8-22 mins        Lewis Shock, PT, DPT Acute Rehabilitation Services Pager #: [phone] Office #: [phone]   Iona Hansen 09/30/2019, 11:05 AM

## 2019-09-30 NOTE — Progress Notes (Signed)
STROKE TEAM PROGRESS NOTE   INTERVAL HISTORY I have personally reviewed history of present illness, electronic medical records and imaging films in PACS.  She presented with subjective right-sided weakness with nonorganic features on exam.  She received IV TPA and obtained some improvement but still complains of subjective right leg weakness and numbness.  MRI scan of the brain is negative for acute stroke.  Blood pressure is inadequately controlled.  Echocardiogram is normal.  CT angiogram showed no significant large vessel intracranial or extracranial stenosis.  Vitals:   09/30/19 0500 09/30/19 0600 09/30/19 0700 09/30/19 0800  BP: 124/82 116/69 114/80 (!) 131/91  Pulse: 92 95 95 94  Resp: 15 14 13 14   Temp:    98.9 F (37.2 C)  TempSrc:    Oral  SpO2: 97% 98% 98% 98%  Weight:      Height:        CBC:  Recent Labs  Lab 09/24/19 1315 09/24/19 1321 09/29/19 0018 09/30/19 0709  WBC 6.5   < > 7.1 4.9  NEUTROABS 3.9  --  4.3  --   HGB 9.6*   < > 9.9* 8.9*  HCT 32.2*   < > 32.8* 29.8*  MCV 71.9*   < > 71.9* 73.0*  PLT 323   < > 312 245   < > = values in this interval not displayed.    Basic Metabolic Panel:  Recent Labs  Lab 09/29/19 0018 09/30/19 0709  NA 127* 136  K 3.8 3.4*  CL 90* 104  CO2 25 24  GLUCOSE 595* 216*  BUN 10 8  CREATININE 0.94 0.68  CALCIUM 8.8* 8.6*   Lipid Panel:     Component Value Date/Time   CHOL 157 09/29/2019 0820   TRIG 159 (H) 09/29/2019 0820   HDL 39 (L) 09/29/2019 0820   CHOLHDL 4.0 09/29/2019 0820   VLDL 32 09/29/2019 0820   LDLCALC 86 09/29/2019 0820   HgbA1c:  Lab Results  Component Value Date   HGBA1C 14.1 (H) 09/29/2019   Urine Drug Screen:     Component Value Date/Time   LABOPIA NONE DETECTED 09/29/2019 1554   COCAINSCRNUR NONE DETECTED 09/29/2019 1554   LABBENZ NONE DETECTED 09/29/2019 1554   AMPHETMU NONE DETECTED 09/29/2019 1554   THCU NONE DETECTED 09/29/2019 1554   LABBARB NONE DETECTED 09/29/2019 1554     Alcohol Level No results found for: ETH  IMAGING past 24 hours MR BRAIN WO CONTRAST  Result Date: 09/30/2019 CLINICAL DATA:  Follow-up examination for acute stroke. EXAM: MRI HEAD WITHOUT CONTRAST TECHNIQUE: Multiplanar, multiecho pulse sequences of the brain and surrounding structures were obtained without intravenous contrast. COMPARISON:  Prior CT and CTA from 09/29/2019. FINDINGS: Brain: Cerebral volume within normal limits for patient age. No significant cerebral white matter disease for age. No abnormal foci of restricted diffusion to suggest acute or subacute ischemia. Gray-white matter differentiation well maintained. No encephalomalacia to suggest chronic infarction. No foci of susceptibility artifact to suggest acute or chronic intracranial hemorrhage. No mass lesion, midline shift or mass effect. No hydrocephalus. No extra-axial fluid collection. Major dural sinuses are grossly patent. Note made of a partially empty sella. Midline structures intact and normal. Vascular: Major intracranial vascular flow voids well maintained and normal in appearance. Skull and upper cervical spine: Craniocervical junction normal. Visualized upper cervical spine within normal limits. Bone marrow signal intensity normal. No scalp soft tissue abnormality. Sinuses/Orbits: Globes and orbital soft tissues within normal limits. Paranasal sinuses are clear. No mastoid effusion.  Inner ear structures normal. Other: None. IMPRESSION: Normal brain MRI.  No acute intracranial abnormality identified. Electronically Signed   By: Rise Mu M.D.   On: 09/30/2019 03:04    PHYSICAL EXAM Obese middle-aged African-American lady not in distress. . Afebrile. Head is nontraumatic. Neck is supple without bruit.    Cardiac exam no murmur or gallop. Lungs are clear to auscultation. Distal pulses are well felt. Neurological Exam ;  Awake  Alert oriented x 3. Normal speech and language.eye movements full without nystagmus.fundi  were not visualized. Vision acuity and fields appear normal. Hearing is normal. Palatal movements are normal. Face symmetric. Tongue midline. Normal strength, tone, reflexes and coordination exam subjective right lower extremity weakness with patient making very poor effort and when distracted her strength seems to be variable.  She can withdraw right leg to pain but will not hold it up off the bed against gravity.  Subjective numbness in the right upper extremity but not in the right lower extremity or face.. Gait deferred.  ASSESSMENT/PLAN [name] is a 54 y.o. female with history of HTN, DB, OSA, sleep apnea, depression, bipolar schizophrenia presenting to [facility] with R sided weakness. Received tPA 09/29/2019 at 0039.  Subjective right hemiparesis due to strokelike episode s/p IV tPA     Code Stroke CT head No acute abnormality. ASPECTS 10.     CTA head & neck no LVO. Mild R ICA bifurcation atherosclerosis. 74mm R thyroid nodule  MRI  No acute stroke  2D Echo EF 50-55%. No source of embolus   LDL 86  HgbA1c 14.1  SCDs for VTE prophylaxis  No antithrombotic prior to admission, now on aspirin 325 mg daily. Decreased aspirin to 81     Therapy recommendations:  HH PT, HH PT  Disposition:  Return home  Transfer to the floor - anticipate d/c Tues  Hypertension  Home meds:  Amlodipine 5, losartan 25, propranolol 20 bid  Received  dose labetalol post tPA to keep in goal so admitted to NICU  Stable . Long-term BP goal normotensive  Hyperlipidemia  Home meds:  lipitor 40, resumed in hospital  LDL 86  Continue statin at discharge  Diabetes type II Uncontrolled  Home meds:  Lantus 15 hs, janumet,   HgbA1c 14.1, goal < 7.0  CBGs Recent Labs    09/30/19 0005 09/30/19 0407 09/30/19 0807  GLUCAP 320* 257* 211*      SSI  Resume home lantus  Other Stroke Risk Factors  Morbid Obesity, Body mass index is 43.81 kg/m., recommend weight loss,  diet and exercise as appropriate   Obstructive sleep apnea  Other Active Problems  Bipolar/schizophrenia  GERD on PPI   Hypokalemia 3.4 - replete  Hospital day # 1 She presented with a strokelike episode and received IV TPA and has shown some improvement but not back to baseline so far.  MRI is negative for acute stroke.  Mobilize out of bed.  Physical occupational therapy consults.  Start aspirin and aggressive risk factor modification.  Continue close neurological monitoring and blood pressure control as per post TPA protocol.This patient is critically ill and at significant risk of neurological worsening, death and care requires constant monitoring of vital signs, hemodynamics,respiratory and cardiac monitoring, extensive review of multiple databases, frequent neurological assessment, discussion with family, other specialists and medical decision making of high complexity.I have made any additions or clarifications directly to the above note.This critical care time does not reflect procedure time, or teaching  time or supervisory time of PA/NP/Med Resident etc but could involve care discussion time.  I spent 30 minutes of neurocritical care time  in the care of  this patient.    Antony Contras, MD  To contact Stroke Continuity provider, please refer to [email or web address] After hours, contact General Neurology

## 2019-09-30 NOTE — Plan of Care (Signed)
MRI neg for stroke Start ASA Transfer to floor. Manage CBGs tighter Stroke team to follow.  -- Milon Dikes, MD [facility] Pager: [phone] If 7pm to 7am, please call on call as listed on [facility].

## 2019-10-01 DIAGNOSIS — R2990 Unspecified symptoms and signs involving the nervous system: Secondary | ICD-10-CM

## 2019-10-01 DIAGNOSIS — K219 Gastro-esophageal reflux disease without esophagitis: Secondary | ICD-10-CM | POA: Diagnosis present

## 2019-10-01 DIAGNOSIS — G4733 Obstructive sleep apnea (adult) (pediatric): Secondary | ICD-10-CM | POA: Diagnosis present

## 2019-10-01 DIAGNOSIS — E785 Hyperlipidemia, unspecified: Secondary | ICD-10-CM | POA: Diagnosis present

## 2019-10-01 LAB — BASIC METABOLIC PANEL
Anion gap: 12 (ref 5–15)
BUN: 10 mg/dL (ref 6–20)
CO2: 23 mmol/L (ref 22–32)
Calcium: 8.7 mg/dL — ABNORMAL LOW (ref 8.9–10.3)
Chloride: 101 mmol/L (ref 98–111)
Creatinine, Ser: 0.7 mg/dL (ref 0.44–1.00)
GFR calc Af Amer: >60 mL/min (ref >60)
GFR calc non Af Amer: >60 mL/min (ref >60)
Glucose, Bld: 274 mg/dL — ABNORMAL HIGH (ref 70–99)
Potassium: 3.8 mmol/L (ref 3.5–5.1)
Sodium: 136 mmol/L (ref 135–145)

## 2019-10-01 LAB — CBC
HCT: 30.9 % — ABNORMAL LOW (ref 36.0–46.0)
Hemoglobin: 8.9 g/dL — ABNORMAL LOW (ref 12.0–15.0)
MCH: 21.1 pg — ABNORMAL LOW (ref 26.0–34.0)
MCHC: 28.8 g/dL — ABNORMAL LOW (ref 30.0–36.0)
MCV: 73.4 fL — ABNORMAL LOW (ref 80.0–100.0)
Platelets: 233 10*3/uL (ref 150–400)
RBC: 4.21 MIL/uL (ref 3.87–5.11)
RDW: 15.2 % (ref 11.5–15.5)
WBC: 4.6 10*3/uL (ref 4.0–10.5)
nRBC: 0 % (ref 0.0–0.2)

## 2019-10-01 LAB — GLUCOSE, CAPILLARY
Glucose-Capillary: 366 mg/dL — ABNORMAL HIGH (ref 70–99)
Glucose-Capillary: 173 mg/dL — ABNORMAL HIGH (ref 70–99)
Glucose-Capillary: 129 mg/dL — ABNORMAL HIGH (ref 70–99)

## 2019-10-01 MED ORDER — INSULIN GLARGINE 100 UNIT/ML ~~LOC~~ SOLN
20.0000 [IU] | Freq: Every day | SUBCUTANEOUS | Status: DC
  Filled 2019-10-01: qty 0.2

## 2019-10-01 MED ORDER — LANTUS SOLOSTAR 100 UNIT/ML ~~LOC~~ SOPN: 40.0000 [IU] | PEN_INJECTOR | Freq: Every day | SUBCUTANEOUS | Status: DC

## 2019-10-01 NOTE — Plan of Care (Deleted)
Pt to be discharged

## 2019-10-01 NOTE — Progress Notes (Signed)
Physical Therapy Treatment Patient Details Name: Monique Holland MRN: [id number] DOB: 08/21/1965 Today's Date: 10/01/2019    History of Present Illness  Monique Holland is a 54 y.o. female who was brought in as a drop off patient at [facility] with complaints of right-sided weakness with last known normal 9 PM.  Given IV TPA and transferred to [facility]. MRI - negative. PMH: HTN, obesity, diabetes, sleep apnea, depression, bipolar schizophrenia.    PT Comments    Pt reports her R LE numbness has resolved and that she doesn't need a walker anymore, and that she's going home today. Pt did ambulate without AD, without LOB however demo's a waddle, short step length with wide base of support type gait pattern. Pt reports having 24/7 assist from son and husband. Acute PT to cont to monitor pt while in hospital.    Follow Up Recommendations  Home health PT;Supervision/Assistance - 24 hour     Equipment Recommendations  (pt reports 'I dont need a walker")    Recommendations for Other Services       Precautions / Restrictions Precautions Precautions: Fall Restrictions Weight Bearing Restrictions: No    Mobility  Bed Mobility Overal bed mobility: Needs Assistance Bed Mobility: Supine to Sit     Supine to sit: Supervision     General bed mobility comments: HOB elevated, pt able to bring LEs off EOB, no physical assist needed  Transfers Overall transfer level: Needs assistance Equipment used: Rolling walker (2 wheeled) Transfers: Sit to/from Stand Sit to Stand: Supervision         General transfer comment: supervision due to previous report of R LE numbness however pt now reports that it has resolved  Ambulation/Gait Ambulation/Gait assistance: Min guard Gait Distance (Feet): 200 Feet Assistive device: None Gait Pattern/deviations: Step-through pattern;Antalgic Gait velocity: dec Gait velocity interpretation: 1.31 - 2.62 ft/sec, indicative of limited  community ambulator General Gait Details: pt refused to use RW this date. pt with decreased step length, waddle type gait pattern, pt with noted L LE limp, pt denies pain,   Stairs             Wheelchair Mobility    Modified Rankin (Stroke Patients Only) Modified Rankin (Stroke Patients Only) Pre-Morbid Rankin Score: No symptoms Modified Rankin: Slight disability     Balance Overall balance assessment: Mild deficits observed, not formally tested         Standing balance support: During functional activity   Standing balance comment: pt washed hands at sink without UE support, without LOB                            Cognition Arousal/Alertness: Awake/alert Behavior During Therapy: WFL for tasks assessed/performed Overall Cognitive Status: Within Functional Limits for tasks assessed                                        Exercises      General Comments General comments (skin integrity, edema, etc.): VSS      Pertinent Vitals/Pain Pain Assessment: No/denies pain    Home Living                      Prior Function            PT Goals (current goals can now be found in the care  plan section) Progress towards PT goals: Progressing toward goals    Frequency    Min 3X/week      PT Plan Frequency needs to be updated    Co-evaluation              AM-PAC PT "6 Clicks" Mobility   Outcome Measure  Help needed turning from your back to your side while in a flat bed without using bedrails?: None Help needed moving from lying on your back to sitting on the side of a flat bed without using bedrails?: None Help needed moving to and from a bed to a chair (including a wheelchair)?: None Help needed standing up from a chair using your arms (e.g., wheelchair or bedside chair)?: None Help needed to walk in hospital room?: None Help needed climbing 3-5 steps with a railing? : A Little 6 Click Score: 23    End of Session  Equipment Utilized During Treatment: Gait belt Activity Tolerance: Patient tolerated treatment well Patient left: in bed;with call bell/phone within reach(sitting at EOB) Nurse Communication: Mobility status PT Visit Diagnosis: Muscle weakness (generalized) (M62.81);Difficulty in walking, not elsewhere classified (R26.2)     Time: 1027-2536 PT Time Calculation (min) (ACUTE ONLY): 18 min  Charges:  $Gait Training: 8-22 mins                     Kittie Plater, PT, DPT Acute Rehabilitation Services Pager #: [phone] Office #: [phone]    Berline Lopes 10/01/2019, 1:12 PM

## 2019-10-01 NOTE — Social Work (Signed)
CSW met with pt at bedside. CSW introduced self and explained her role. CSW completed sbirt with pt.  Pt scored a 0 on the sbirt scale. Pt denied alcohol use. Pt denied substance use. Pt did not need resources at this time.  Emeterio Reeve, Latanya Presser, [place] Social Worker [phone]

## 2019-10-01 NOTE — Discharge Summary (Addendum)
Stroke Discharge Summary  Patient ID: Monique Holland   MRN: [id number]      DOB: 1965-12-28  Date of Admission: 09/29/2019 Date of Discharge: 10/01/2019  Attending Physician:  Micki Riley, MD, Stroke MD Consultant(s):    None  Patient's PCP:  Knox Royalty, MD  DISCHARGE DIAGNOSIS:  Principal Problem:   Stroke-like episode s/p tPA Active Problems:   Type 2 diabetes mellitus (HCC)   Schizophrenia (HCC)   Obesity, Class III, BMI 40-49.9 (morbid obesity) (HCC)   Iron deficiency   Essential hypertension   Right sided weakness   Hyperlipidemia   OSA (obstructive sleep apnea)   GERD (gastroesophageal reflux disease)   Allergies as of 10/01/2019   No Known Allergies     Medication List    STOP taking these medications   fluconazole 200 MG tablet Commonly known as: DIFLUCAN   polyethylene glycol 17 g packet Commonly known as: MIRALAX / GLYCOLAX   senna 8.6 MG Tabs tablet Commonly known as: SENOKOT     TAKE these medications   albuterol 108 (90 Base) MCG/ACT inhaler Commonly known as: Ventolin HFA Inhale 1-2 puffs into the lungs every 6 (six) hours as needed for wheezing or shortness of breath.   amitriptyline 75 MG tablet Commonly known as: ELAVIL Take 75 mg by mouth at bedtime.   amLODipine 5 MG tablet Commonly known as: NORVASC Take 5 mg by mouth daily.   atorvastatin 40 MG tablet Commonly known as: LIPITOR Take 40 mg by mouth daily.   clonazePAM 0.5 MG tablet Commonly known as: KLONOPIN Take 0.5 mg by mouth daily.   Janumet 50-1000 MG tablet Generic drug: sitaGLIPtin-metformin Take 1 tablet by mouth 2 (two) times daily.   lamoTRIgine 100 MG tablet Commonly known as: LAMICTAL Take 100 mg by mouth daily.   Lantus SoloStar 100 UNIT/ML Solostar Pen Generic drug: insulin glargine Inject 40 Units into the skin at bedtime.   Latuda 60 MG Tabs Generic drug: Lurasidone HCl Take 1 tablet by mouth at bedtime.   losartan 25 MG tablet Commonly  known as: COZAAR Take 25 mg by mouth daily.   ondansetron 8 MG tablet Commonly known as: ZOFRAN Take 1 tablet (8 mg total) by mouth every 8 (eight) hours as needed for nausea or vomiting.   Oxycodone HCl 10 MG Tabs Take 10 mg by mouth every 4 (four) hours as needed (pain).   pantoprazole 40 MG tablet Commonly known as: PROTONIX Take 40 mg by mouth daily.   propranolol 20 MG tablet Commonly known as: INDERAL Take 20 mg by mouth 2 (two) times daily.   risperiDONE 0.5 MG tablet Commonly known as: RISPERDAL Take 0.5 mg by mouth 2 (two) times daily.   traZODone 100 MG tablet Commonly known as: DESYREL Take 100 mg by mouth at bedtime.   Trulicity 4.5 MG/0.5ML Sopn Generic drug: Dulaglutide Inject 4.5 mg into the skin every Wednesday.       LABORATORY STUDIES CBC    Component Value Date/Time   WBC 4.6 10/01/2019 0625   RBC 4.21 10/01/2019 0625   HGB 8.9 (L) 10/01/2019 0625   HCT 30.9 (L) 10/01/2019 0625   PLT 233 10/01/2019 0625   MCV 73.4 (L) 10/01/2019 0625   MCH 21.1 (L) 10/01/2019 0625   MCHC 28.8 (L) 10/01/2019 0625   RDW 15.2 10/01/2019 0625   LYMPHSABS 2.1 09/29/2019 0018   MONOABS 0.6 09/29/2019 0018   EOSABS 0.1 09/29/2019 0018   BASOSABS 0.1 09/29/2019 0018  CMP    Component Value Date/Time   NA 136 10/01/2019 0625   K 3.8 10/01/2019 0625   CL 101 10/01/2019 0625   CO2 23 10/01/2019 0625   GLUCOSE 274 (H) 10/01/2019 0625   BUN 10 10/01/2019 0625   CREATININE 0.70 10/01/2019 0625   CALCIUM 8.7 (L) 10/01/2019 0625   CALCIUM 11.5 (H) 09/03/2018 0935   PROT 7.5 09/29/2019 0018   ALBUMIN 3.0 (L) 09/29/2019 0018   AST 17 09/29/2019 0018   ALT 11 09/29/2019 0018   ALKPHOS 85 09/29/2019 0018   BILITOT 0.4 09/29/2019 0018   GFRNONAA >60 10/01/2019 0625   GFRAA >60 10/01/2019 0625   COAGS Lab Results  Component Value Date   INR 1.0 09/29/2019   INR 0.9 08/20/2019   INR 1.0 07/17/2019   Lipid Panel    Component Value Date/Time   CHOL 157  09/29/2019 0820   TRIG 159 (H) 09/29/2019 0820   HDL 39 (L) 09/29/2019 0820   CHOLHDL 4.0 09/29/2019 0820   VLDL 32 09/29/2019 0820   LDLCALC 86 09/29/2019 0820   HgbA1C  Lab Results  Component Value Date   HGBA1C 14.1 (H) 09/29/2019   Urinalysis    Component Value Date/Time   COLORURINE YELLOW 09/24/2019 1745   APPEARANCEUR CLEAR 09/24/2019 1745   LABSPEC 1.015 09/24/2019 1745   PHURINE 7.5 09/24/2019 1745   GLUCOSEU >=500 (A) 09/24/2019 1745   HGBUR NEGATIVE 09/24/2019 1745   BILIRUBINUR NEGATIVE 09/24/2019 1745   KETONESUR NEGATIVE 09/24/2019 1745   PROTEINUR NEGATIVE 09/24/2019 1745   UROBILINOGEN 0.2 02/12/2015 1348   NITRITE NEGATIVE 09/24/2019 1745   LEUKOCYTESUR NEGATIVE 09/24/2019 1745   Urine Drug Screen     Component Value Date/Time   LABOPIA NONE DETECTED 09/29/2019 1554   COCAINSCRNUR NONE DETECTED 09/29/2019 1554   LABBENZ NONE DETECTED 09/29/2019 1554   AMPHETMU NONE DETECTED 09/29/2019 1554   THCU NONE DETECTED 09/29/2019 1554   LABBARB NONE DETECTED 09/29/2019 1554     SIGNIFICANT DIAGNOSTIC STUDIES CT Code Stroke CTA Head W/WO contrast  Result Date: 09/29/2019 CLINICAL DATA:  Initial evaluation for acute right-sided weakness. EXAM: CT ANGIOGRAPHY HEAD AND NECK TECHNIQUE: Multidetector CT imaging of the head and neck was performed using the standard protocol during bolus administration of intravenous contrast. Multiplanar CT image reconstructions and MIPs were obtained to evaluate the vascular anatomy. Carotid stenosis measurements (when applicable) are obtained utilizing NASCET criteria, using the distal internal carotid diameter as the denominator. CONTRAST:  OMNIPAQUE IOHEXOL 350 MG/ML SOLN COMPARISON:  Prior head CT from earlier same day. FINDINGS: CTA NECK FINDINGS Aortic arch: Visualized aortic arch of normal caliber with normal branch pattern. No hemodynamically significant stenosis seen about the origin of the great vessels. Visualized  subclavian arteries widely patent. Right carotid system: Right common carotid artery widely patent from its origin to the bifurcation without stenosis. Mild eccentric calcified plaque at the right bifurcation without significant stenosis. Right ICA widely patent distally to the skull base without stenosis, dissection or occlusion. Left carotid system: Left common carotid artery widely patent from its origin to the bifurcation without stenosis. No significant atheromatous narrowing about the left bifurcation. Left ICA widely patent distally to the skull base without stenosis, dissection or occlusion. Vertebral arteries: Both vertebral arteries arise from the subclavian arteries. Vertebral arteries widely patent without stenosis, dissection or occlusion. Skeleton: No acute osseous abnormality. No discrete or worrisome osseous lesions. Other neck: No other acute soft tissue abnormality within the neck. 11 mm right  thyroid nodule noted (series 4, image 69). No other mass lesion or adenopathy. Upper chest: Visualized upper chest demonstrates no acute finding. Review of the MIP images confirms the above findings CTA HEAD FINDINGS Anterior circulation: Both internal carotid arteries widely patent to the termini without stenosis. A1 segments widely patent. Normal anterior communicating artery complex. Anterior cerebral arteries widely patent to their distal aspects. No M1 stenosis or occlusion. Normal MCA bifurcations. Distal MCA branches well perfused and symmetric. Posterior circulation: Both vertebral arteries widely patent to the vertebrobasilar junction without stenosis. Both picas patent. Basilar widely patent to its distal aspect. Superior cerebral arteries patent bilaterally. Both PCAs primarily supplied via the basilar well perfused to their distal aspects. Venous sinuses: Patent. Anatomic variants: None significant. Review of the MIP images confirms the above findings IMPRESSION: 1. Negative CTA for large vessel  occlusion. 2. Mild for age atheromatous change about the right carotid bifurcation without stenosis. No other significant atheromatous disease about the major arterial vasculature of the head and neck. No hemodynamically significant or correctable stenosis. 3. 11 mm right thyroid nodule, of doubtful significance given size and patient age. No follow-up imaging recommended. (Ref: J Am Coll Radiol. 2015 Feb;12(2): 143-50). Results were called by telephone at the time of interpretation on 09/29/2019 at 1:05 am to provider Dr. Wilford Corner, Who verbally acknowledged these results. Electronically Signed   By: Rise Mu M.D.   On: 09/29/2019 01:40   CT Code Stroke CTA Neck W/WO contrast  Result Date: 09/29/2019 CLINICAL DATA:  Initial evaluation for acute right-sided weakness. EXAM: CT ANGIOGRAPHY HEAD AND NECK TECHNIQUE: Multidetector CT imaging of the head and neck was performed using the standard protocol during bolus administration of intravenous contrast. Multiplanar CT image reconstructions and MIPs were obtained to evaluate the vascular anatomy. Carotid stenosis measurements (when applicable) are obtained utilizing NASCET criteria, using the distal internal carotid diameter as the denominator. CONTRAST:  OMNIPAQUE IOHEXOL 350 MG/ML SOLN COMPARISON:  Prior head CT from earlier same day. FINDINGS: CTA NECK FINDINGS Aortic arch: Visualized aortic arch of normal caliber with normal branch pattern. No hemodynamically significant stenosis seen about the origin of the great vessels. Visualized subclavian arteries widely patent. Right carotid system: Right common carotid artery widely patent from its origin to the bifurcation without stenosis. Mild eccentric calcified plaque at the right bifurcation without significant stenosis. Right ICA widely patent distally to the skull base without stenosis, dissection or occlusion. Left carotid system: Left common carotid artery widely patent from its origin to the  bifurcation without stenosis. No significant atheromatous narrowing about the left bifurcation. Left ICA widely patent distally to the skull base without stenosis, dissection or occlusion. Vertebral arteries: Both vertebral arteries arise from the subclavian arteries. Vertebral arteries widely patent without stenosis, dissection or occlusion. Skeleton: No acute osseous abnormality. No discrete or worrisome osseous lesions. Other neck: No other acute soft tissue abnormality within the neck. 11 mm right thyroid nodule noted (series 4, image 69). No other mass lesion or adenopathy. Upper chest: Visualized upper chest demonstrates no acute finding. Review of the MIP images confirms the above findings CTA HEAD FINDINGS Anterior circulation: Both internal carotid arteries widely patent to the termini without stenosis. A1 segments widely patent. Normal anterior communicating artery complex. Anterior cerebral arteries widely patent to their distal aspects. No M1 stenosis or occlusion. Normal MCA bifurcations. Distal MCA branches well perfused and symmetric. Posterior circulation: Both vertebral arteries widely patent to the vertebrobasilar junction without stenosis. Both picas patent. Basilar widely patent  to its distal aspect. Superior cerebral arteries patent bilaterally. Both PCAs primarily supplied via the basilar well perfused to their distal aspects. Venous sinuses: Patent. Anatomic variants: None significant. Review of the MIP images confirms the above findings IMPRESSION: 1. Negative CTA for large vessel occlusion. 2. Mild for age atheromatous change about the right carotid bifurcation without stenosis. No other significant atheromatous disease about the major arterial vasculature of the head and neck. No hemodynamically significant or correctable stenosis. 3. 11 mm right thyroid nodule, of doubtful significance given size and patient age. No follow-up imaging recommended. (Ref: J Am Coll Radiol. 2015 Feb;12(2):  143-50). Results were called by telephone at the time of interpretation on 09/29/2019 at 1:05 am to provider Dr. Wilford Corner, Who verbally acknowledged these results. Electronically Signed   By: Rise Mu M.D.   On: 09/29/2019 01:40   MR BRAIN WO CONTRAST  Result Date: 09/30/2019 CLINICAL DATA:  Follow-up examination for acute stroke. EXAM: MRI HEAD WITHOUT CONTRAST TECHNIQUE: Multiplanar, multiecho pulse sequences of the brain and surrounding structures were obtained without intravenous contrast. COMPARISON:  Prior CT and CTA from 09/29/2019. FINDINGS: Brain: Cerebral volume within normal limits for patient age. No significant cerebral white matter disease for age. No abnormal foci of restricted diffusion to suggest acute or subacute ischemia. Gray-white matter differentiation well maintained. No encephalomalacia to suggest chronic infarction. No foci of susceptibility artifact to suggest acute or chronic intracranial hemorrhage. No mass lesion, midline shift or mass effect. No hydrocephalus. No extra-axial fluid collection. Major dural sinuses are grossly patent. Note made of a partially empty sella. Midline structures intact and normal. Vascular: Major intracranial vascular flow voids well maintained and normal in appearance. Skull and upper cervical spine: Craniocervical junction normal. Visualized upper cervical spine within normal limits. Bone marrow signal intensity normal. No scalp soft tissue abnormality. Sinuses/Orbits: Globes and orbital soft tissues within normal limits. Paranasal sinuses are clear. No mastoid effusion. Inner ear structures normal. Other: None. IMPRESSION: Normal brain MRI.  No acute intracranial abnormality identified. Electronically Signed   By: Rise Mu M.D.   On: 09/30/2019 03:04   CT ABDOMEN PELVIS W CONTRAST  Result Date: 09/10/2019 CLINICAL DATA:  Pancreatitis, persistent. Left abdomen and flank pain. EXAM: CT ABDOMEN AND PELVIS WITH CONTRAST TECHNIQUE:  Multidetector CT imaging of the abdomen and pelvis was performed using the standard protocol following bolus administration of intravenous contrast. CONTRAST:  OMNIPAQUE IOHEXOL 300 MG/ML  SOLN COMPARISON:  CT a abdomen 08/21/2019. CT of the abdomen pelvis 08/16/2019. FINDINGS: Lower chest: Linear atelectasis or scarring is present at the left base. Lung bases are otherwise clear. The heart size is normal. No significant pleural or pericardial effusion is present. Hepatobiliary: No focal liver abnormality is seen. No gallstones, gallbladder wall thickening, or biliary dilatation. Pancreas: Pancreatic head within normal limits. Diffuse inflammatory changes are present throughout the body, similar the prior exam. Cysto gastrostomy tube is in place. The peripancreatic fluid collections have decreased in size. Increased wall thickening inflammatory changes are associated. There is gas within the more central collection. Spleen: Secondary inflammatory changes present at the inferior aspect of the spleen. Mild splenomegaly is noted. The spleen measures 13 mm cephalo caudad. Adrenals/Urinary Tract: The adrenal glands are normal bilaterally. Nonobstructing nephrolithiasis is again noted. Ureters are within normal limits. The urinary bladder is normal. Stomach/Bowel: Drainage catheter is in place in the stomach. Secondary inflammatory changes are present the duodenum. Distal small bowel is mostly collapsed without additional changes. Terminal ileum is within normal  limits. Appendix is surgically absent. The ascending proximal transverse colon is. Diffuse inflammatory changes are present at the splenic flexure. Descending and sigmoid colon are normal. Vascular/Lymphatic: Atherosclerotic calcifications are present without aneurysm. No significant retroperitoneal adenopathy is present. Reproductive: Uterus and bilateral adnexa are unremarkable. Other: No abdominal wall hernia or abnormality. No abdominopelvic ascites.  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 Endplate changes are noted at L4-5. Fused anterior osteophytes are present the thoracic spine. No focal lytic or blastic lesions are present. Bony pelvis is within normal limits. Hips are located and within normal limits bilaterally. IMPRESSION: 1. 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 2. Wall thickening about the pseudocysts and increased inflammatory changes suggests secondary infection of the pseudocysts. 3. Gas within the pseudocysts is likely due to the drainage catheter. 4. Diffuse inflammatory changes throughout the body of the pancreas compatible with acute on chronic pancreatitis. 5. Secondary inflammatory changes of the colon at the splenic flexure and of the duodenum. 6. Nonobstructing nephrolithiasis. 7. 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 8. Aortic Atherosclerosis (ICD10-I70.0). Electronically Signed   By: Marin Roberts M.D.   On: 09/10/2019 05:20   DG Abd 2 Views  Result Date: 09/18/2019 CLINICAL DATA:  Check pancreatic stent placement EXAM: ABDOMEN - 2 VIEW COMPARISON:  09/14/2019 FINDINGS: Previously seen Axios cystogastrostomy stent has been removed in the interval. A new double-J ureteral stent is noted extending from the stomach into the pancreatic pseudocyst. Two previously placed pigtail catheters are identified and have migrated into the ascending colon slightly more proximal within the ascending colon when compared with the prior study. Scattered fecal material is noted. No free air is seen. No bony abnormality is noted. IMPRESSION: New cystogastrostomy stent in satisfactory position. Previously seen Axios stent has been removed. Previously placed double pigtail stents are again noted within the ascending colon but slightly more proximal than that seen on the recent exam. Electronically Signed   By: Alcide Clever M.D.   On: 09/18/2019 16:17    DG Abdomen Acute W/Chest  Result Date: 09/14/2019 CLINICAL DATA:  Left-sided abdominal pain x3 days. EXAM: DG ABDOMEN ACUTE W/ 1V CHEST COMPARISON:  August 10, 2018 FINDINGS: There is no evidence of dilated bowel loops or free intraperitoneal air. A moderate amount of stool is seen. A radiopaque mesh like stent is seen overlying the left upper quadrant with a curvilinear radiopaque stent seen overlying the mid to upper right abdomen. No radiopaque calculi or other significant radiographic abnormality is seen. Heart size and mediastinal contours are within normal limits. A very mild amount of atelectasis is noted within the left lung base. IMPRESSION: 1. No acute cardiopulmonary disease. 2. Moderate stool burden without evidence of bowel obstruction. Electronically Signed   By: Aram Candela M.D.   On: 09/14/2019 23:49   MM 3D SCREEN BREAST BILATERAL  Result Date: 09/06/2019 CLINICAL DATA:  Screening. EXAM: DIGITAL SCREENING BILATERAL MAMMOGRAM WITH TOMO AND CAD COMPARISON:  None. ACR Breast Density Category b: There are scattered areas of fibroglandular density. FINDINGS: There are no findings suspicious for malignancy. Images were processed with CAD. IMPRESSION: No mammographic evidence of malignancy. A result letter of this screening mammogram will be mailed directly to the patient. RECOMMENDATION: Screening mammogram in one year. (Code:SM-B-01Y) BI-RADS CATEGORY  1: Negative. Electronically Signed   By: Britta Mccreedy M.D.   On: 09/06/2019 16:06   ECHOCARDIOGRAM COMPLETE  Result Date: 09/29/2019    ECHOCARDIOGRAM REPORT  Patient Name:   [facility] Suber Date of Exam: 09/29/2019 Medical Rec #:  [id number]       Height:       68.0 in Accession #:    [id number]      Weight:       288.1 lb Date of Birth:  29-Jan-1966      BSA:          2.387 m Patient Age:    53 years        BP:           137/100 mmHg Patient Gender: F               HR:           107 bpm. Exam Location:  Inpatient Procedure: 2D Echo,  Cardiac Doppler and Color Doppler Indications:    CVA  History:        Patient has no prior history of Echocardiogram examinations.                 Stroke; Risk Factors:Hypertension, Diabetes, Sleep Apnea and                 Obesity. Schizophrenia.  Sonographer:    Lavenia Atlas Referring Phys: [id number] ASHISH ARORA IMPRESSIONS  1. Left ventricular ejection fraction, by estimation, is 50 to 55%. The left ventricle has low normal function. The left ventricle has no regional wall motion abnormalities. There is mild left ventricular hypertrophy. Left ventricular diastolic parameters are consistent with Grade II diastolic dysfunction (pseudonormalization).  2. Right ventricular systolic function is normal. The right ventricular size is normal.  3. The mitral valve is normal in structure. No evidence of mitral valve regurgitation. No evidence of mitral stenosis.  4. The aortic valve is normal in structure. Aortic valve regurgitation is not visualized. No aortic stenosis is present. FINDINGS  Left Ventricle: Left ventricular ejection fraction, by estimation, is 50 to 55%. The left ventricle has low normal function. The left ventricle has no regional wall motion abnormalities. The left ventricular internal cavity size was normal in size. There is mild left ventricular hypertrophy. Left ventricular diastolic parameters are consistent with Grade II diastolic dysfunction (pseudonormalization). Right Ventricle: The right ventricular size is normal. No increase in right ventricular wall thickness. Right ventricular systolic function is normal. Left Atrium: Left atrial size was normal in size. Right Atrium: Right atrial size was normal in size. Pericardium: There is no evidence of pericardial effusion. Mitral Valve: The mitral valve is normal in structure. No evidence of mitral valve regurgitation. No evidence of mitral valve stenosis. Tricuspid Valve: The tricuspid valve is normal in structure. Tricuspid valve regurgitation  is trivial. No evidence of tricuspid stenosis. Aortic Valve: The aortic valve is normal in structure. Aortic valve regurgitation is not visualized. No aortic stenosis is present. Pulmonic Valve: The pulmonic valve was normal in structure. Pulmonic valve regurgitation is not visualized. No evidence of pulmonic stenosis. Aorta: The aortic root and ascending aorta are structurally normal, with no evidence of dilitation. IAS/Shunts: The atrial septum is grossly normal.  LEFT VENTRICLE PLAX 2D LVIDd:         5.68 cm LVIDs:         3.72 cm LV PW:         1.32 cm LV IVS:        1.31 cm LVOT diam:     2.20 cm LV SV:         57 LV SV Index:  24 LVOT Area:     3.80 cm  RIGHT VENTRICLE RV Basal diam:  2.47 cm RV S prime:     8.59 cm/s TAPSE (M-mode): 2.7 cm LEFT ATRIUM             Index       RIGHT ATRIUM           Index LA diam:        3.80 cm 1.59 cm/m  RA Area:     12.50 cm LA Vol (A2C):   54.4 ml 22.79 ml/m RA Volume:   30.80 ml  12.90 ml/m LA Vol (A4C):   50.2 ml 21.03 ml/m LA Biplane Vol: 55.8 ml 23.38 ml/m  AORTIC VALVE LVOT Vmax:   79.60 cm/s LVOT Vmean:  56.000 cm/s LVOT VTI:    0.151 m  AORTA Ao Root diam: 3.30 cm MITRAL VALVE MV Area (PHT): 12.04 cm   SHUNTS MV Decel Time: 63 msec     Systemic VTI:  0.15 m MV E velocity: 83.60 cm/s  Systemic Diam: 2.20 cm MV A velocity: 57.80 cm/s MV E/A ratio:  1.45 Kristeen MissPhilip Nahser MD Electronically signed by Kristeen MissPhilip Nahser MD Signature Date/Time: 09/29/2019/12:37:43 PM    Final    CT HEAD CODE STROKE WO CONTRAST  Result Date: 09/29/2019 CLINICAL DATA:  Code stroke.  Initial evaluation for acute stroke. EXAM: CT HEAD WITHOUT CONTRAST TECHNIQUE: Contiguous axial images were obtained from the base of the skull through the vertex without intravenous contrast. COMPARISON:  None. FINDINGS: Brain: Cerebral volume within normal limits for patient age. No evidence for acute intracranial hemorrhage. No findings to suggest acute large vessel territory infarct. No mass lesion, midline  shift, or mass effect. Ventricles are normal in size without evidence for hydrocephalus. No extra-axial fluid collection identified. Vascular: No hyperdense vessel identified. Skull: Scalp soft tissues demonstrate no acute abnormality. Calvarium intact. Sinuses/Orbits: Globes and orbital soft tissues within normal limits. Visualized paranasal sinuses are clear. No mastoid effusion. ASPECTS [facility]([place] Stroke Program Early CT Score) - Ganglionic level infarction (caudate, lentiform nuclei, internal capsule, insula, M1-M3 cortex): - Supraganglionic infarction (M4-M6 cortex): Total score (0-10 with 10 being normal): IMPRESSION: 1. Negative head CT.  No acute intracranial abnormality. 2. ASPECTS is 10. Critical Value/emergent results were called by telephone at the time of interpretation on 09/29/2019 at 12:29 am to nurse practitioner Dory PeruShannon Maynard, who verbally acknowledged these results. Electronically Signed   By: Rise MuBenjamin  McClintock M.D.   On: 09/29/2019 00:29      HISTORY OF PRESENT ILLNESS Monique Holland is a 54 y.o. female who was brought in as a drop off patient [facility] with complaints of right-sided weakness with last known normal 9 PM on 09/28/2019. Premorbid modified Rankin scale (mRS): 1.  Evaluated by telemedicine neurology at [facility] and given IV TPA and [facility] post TPA care.\  Monique Holland has a past medical history of hypertension, obesity, diabetes, sleep apnea, depression, bipolar schizophrenia. Patient reports that she was in her usual state of health, few days ago when she started having episodes of hyperglycemia and hypertension that was difficult to control.  She had a ER visit on 09/24/2019 for severe hyperglycemia without DKA.  She continued to have difficulty controlling her sugars.  She reports compliance to medications to me but to the nurses she reported that she does not often check her sugars  only takes her Lantus and does not  adhere to the diabetic regimen. The day of admission she had had some nausea and feeling of not being well for most of the day but around 9 PM she had sudden onset of worsening nausea and feeling that her right arm and leg has gone numb and she could not lift it.  She reported the numbness as pinprick/ants crawling sensation and not Novocain-like numbness.  Her son drove her to the [facility] where she was evaluated by telemedicine neurology.  NIH stroke scale documented in the telemedicine consultation is a 6.  Risk benefits of TPA per the chart were discussed by telemedicine neurology, and TPA was given after noncontrast head CT did not show any bleed. CTA head and neck was done-unremarkable for LVO. Transferred over to [facility] for post TPA care. Blood pressures were slightly above the accepted range for TPA and required 1 dose of labetalol IV 10 mg. Denies chest pain shortness of breath.  Denies nausea vomiting at this time.  Denies abdominal pain.  Denies sick contacts. Received her Covid vaccination-Pfizer-second dose received middle of April 2021. Denies recent stressors or anxiety.  Denies worsening depression.  Denies any suicidal or homicidal ideation.  Chart review also reveals that she has undergone upper GI endoscopy with Dr. 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 Her last hemoglobin A1c 09/17/2019 was 13.0.  Blood glucose was 595 today prior to TPA administration.  HOSPITAL COURSE Monique Holland is a 54 y.o. female with history of HTN, DB, OSA, sleep apnea, depression, bipolar schizophrenia presenting to [facility] with R sided weakness. Received tPA 09/29/2019 at 0039.  She obtained near total improvement of her symptoms with my right leg weakness mostly due to poor effort.  She was able to ambulate in the unit and physical and occupational therapy  recommend only home therapies and discharge home  Subjective right hemiparesis due to stroke-like episode s/p IV tPA     Code Stroke CT head No acute abnormality. ASPECTS 10.     CTA head & neck no LVO. Mild R ICA bifurcation atherosclerosis. 11mm R thyroid nodule  MRI  No acute stroke  2D Echo EF 50-55%. No source of embolus   LDL 86  HgbA1c 14.1  No antithrombotic prior to admission, treated with aspirin 81 daily in hospital post tPA. Given low HGB and no stroke dx, will not continue aspirin at d/c.   Therapy recommendations:  HH PT, HH OT  Disposition:  Return home  Hypertension  Home meds:  Amlodipine 5, losartan 25, propranolol 20 bid  Received labetalol post tPA to keep in goal so admitted to NICU  Stable  Resumed home meds  BP goal normotensive  Hyperlipidemia  Home meds:  lipitor 40, resumed in hospital  LDL 86  Continue statin at discharge  Diabetes type II Uncontrolled  Home meds:  Lantus 15 hs, janumet, Trulicity  HgbA1c 14.1, goal < 7.0  DB consulted - recommends increasing lantus to 20 q hs. Ordered  Resumed home meds at d/c  Close PCP follow up  Other Stroke Risk Factors  Morbid Obesity, Body mass index is 43.81 kg/m., recommend weight loss, diet and exercise as appropriate   Obstructive sleep apnea  Other Active Problems  Bipolar/schizophrenia  GERD on PPI   Hypokalemia 3.4 - replete  Anemia - currently undergoing GI workup as an OP and close f/u w/ PCP. Continue follow up with them  at d/c.  DISCHARGE EXAM Blood pressure 112/81, pulse 96, temperature 99.6 F (37.6 C), temperature source Oral, resp. rate 16, height 5\' 8"  (1.727 m), weight 130.7 kg, last menstrual period 03/23/2014, SpO2 99 %. Obese middle-aged African-American lady not in distress. . Afebrile. Head is nontraumatic. Neck is supple without bruit.    Cardiac exam no murmur or gallop. Lungs are clear to auscultation. Distal pulses are well felt. Neurological  Exam ;  Awake  Alert oriented x 3. Normal speech and language.eye movements full without nystagmus.fundi were not visualized. Vision acuity and fields appear normal. Hearing is normal. Palatal movements are normal. Face symmetric. Tongue midline. Normal strength, tone, reflexes and coordination exam subjective right lower extremity weakness with patient making very poor effort and when distracted her strength seems to be variable.  She mild subjective right leg weakness but effort is poor and variable Subjective numbness in the right upper extremity resolved on the day of discharge.Gait able to ambulate with slight favoring and dragging of the right leg  Discharge Diet   Heart healthy / carb modified, thin liquids  DISCHARGE PLAN  Disposition:  Return home  Home health PT and OP  Ongoing stroke risk factor control by Primary Care Physician at time of discharge  Follow-up PCP Kristie Cowman, MD in 2 weeks - low HGB, DB control  Continue follow up with GI at d/c  No neuro follow up indicated at d/c  35 minutes were spent preparing discharge.  Burnetta Sabin, MSN, APRN, ANVP-BC, AGPCNP-BC Advanced Practice Stroke Nurse [place] for Schedule & Pager information 10/01/2019 1:05 PM   I have personally obtained history,examined this patient, reviewed notes, independently viewed imaging studies, participated in medical decision making and plan of care.ROS completed by me personally and pertinent positives fully documented  I have made any additions or clarifications directly to the above note. Agree with note above.    Antony Contras, MD Medical Director [facility] Pager: [phone] 10/01/2019 2:17 PM

## 2019-10-01 NOTE — Progress Notes (Signed)
Pt d/c'd. See flowsheet for d/c vitals/assessment. D/c teaching completed. Questions answered. PIV d/c'd, site WNL.

## 2019-10-01 NOTE — TOC Transition Note (Signed)
Transition of Care [facility]) - CM/SW Discharge Note   Patient Details  Name: Monique Holland MRN: [id number] Date of Birth: 1966-01-19  Transition of Care [facility]) CM/SW Contact:  Glennon Mac, RN Phone Number: 10/01/2019, 2:55 PM   Clinical Narrative:    Monique Holland is a 54 y.o. female who was brought in as a drop off patient at [facility] with complaints of right-sided weakness with last known normal 9 PM.  Given IV TPA and transferred to [facility] hospit[facility]. MRI - negative.  PTA, pt independent, lives with spouse, who can provide 24h assistance at home.  PT/OT recommending HH follow up, but unable to secure [facility] services due to staffing and insurance issues.  Pt understands this, and is agreeable to OP PT/OT at [facility].  Referrals made to [facility] and appt information placed on AVS.  Rehab center will call pt for appt.        Final next level of care: OP Rehab Barriers to Discharge: Barriers Resolved            Discharge Plan and Services   Discharge Planning Services: CM Consult                                 Social Determinants of Health (SDOH) Interventions     Readmission Risk Interventions Readmission Risk Prevention Plan 10/01/2019 08/20/2019 09/04/2018  Transportation Screening Complete Complete Complete  PCP or Specialist Appt within 5-7 Days - - Complete  Home Care Screening - - Complete  Medication Review (RN CM) - - Complete  Medication Review [employer]) Complete Complete -  PCP or Specialist appointment within 3-5 days of discharge Complete Complete -  HRI or Home Care Consult Not Complete - -  HRI or Home Care Consult Pt Refusal Comments Unable to secure [facility] services; referral to OP rehab - -  SW Recovery Care/Counseling Consult Patient refused Complete -  Palliative Care Screening Not Applicable Not Applicable -  Skilled Nursing Facility Not Applicable Not Applicable -  Some recent data might be hidden    Quintella Baton, RN, BSN  Trauma/Neuro ICU Case Manager [phone]

## 2019-10-01 NOTE — Progress Notes (Addendum)
Inpatient Diabetes Program Recommendations  AACE/ADA: New Consensus Statement on Inpatient Glycemic Control (2015)  Target Ranges:  Prepandial:   less than 140 mg/dL      Peak postprandial:   less than 180 mg/dL (1-2 hours)      Critically ill patients:  140 - 180 mg/dL   Lab Results  Component Value Date   GLUCAP 173 (H) 10/01/2019   HGBA1C 14.1 (H) 09/29/2019    Review of Glycemic Control  Diabetes history: DM2 Outpatient Diabetes medications: Lantus 40 units QHS, Janumet 50/1000 mg bid, Trulicity 4.5 mg weekly  Current orders for Inpatient glycemic control: Lantus 15 units QHS, Novolog 0-20 units Q4H  HgbA1C - 14.1%. Uncontrolled DM Post-prandials elevated.  Inpatient Diabetes Program Recommendations:    Increase Lantus to 20 units QHS Add Novolog 4 units tidwc for meal coverage insulin if pt eats > 50% meal.  Speak with pt about uncontrolled DM and HgbA1C of 14.1%. Will need close f/u with PCP for tighter glycemic control.  Continue to follow.  Thank you. Ailene Ards, RD, LDN, CDE Inpatient Diabetes Coordinator [phone]

## 2019-10-02 ENCOUNTER — Inpatient Hospital Stay (HOSPITAL_COMMUNITY): Admission: RE | Admit: 2019-10-02 | Payer: Medicare Other | Source: Ambulatory Visit

## 2019-10-16 ENCOUNTER — Inpatient Hospital Stay (HOSPITAL_BASED_OUTPATIENT_CLINIC_OR_DEPARTMENT_OTHER)
Admission: EM | Admit: 2019-10-16 | Discharge: 2019-10-19 | DRG: 438 | Disposition: A | Discharge status: Home / Self Care | Payer: Medicare Other | Attending: Family Medicine | Admitting: Family Medicine

## 2019-10-16 ENCOUNTER — Other Ambulatory Visit: Payer: Self-pay

## 2019-10-16 ENCOUNTER — Emergency Department (HOSPITAL_BASED_OUTPATIENT_CLINIC_OR_DEPARTMENT_OTHER): Payer: Medicare Other

## 2019-10-16 ENCOUNTER — Encounter (HOSPITAL_BASED_OUTPATIENT_CLINIC_OR_DEPARTMENT_OTHER): Payer: Self-pay | Admitting: *Deleted

## 2019-10-16 DIAGNOSIS — E785 Hyperlipidemia, unspecified: Secondary | ICD-10-CM | POA: Diagnosis present

## 2019-10-16 DIAGNOSIS — D62 Acute posthemorrhagic anemia: Secondary | ICD-10-CM | POA: Diagnosis not present

## 2019-10-16 DIAGNOSIS — R161 Splenomegaly, not elsewhere classified: Secondary | ICD-10-CM | POA: Diagnosis present

## 2019-10-16 DIAGNOSIS — K219 Gastro-esophageal reflux disease without esophagitis: Secondary | ICD-10-CM | POA: Diagnosis present

## 2019-10-16 DIAGNOSIS — I10 Essential (primary) hypertension: Secondary | ICD-10-CM | POA: Diagnosis present

## 2019-10-16 DIAGNOSIS — F209 Schizophrenia, unspecified: Secondary | ICD-10-CM | POA: Diagnosis present

## 2019-10-16 DIAGNOSIS — N2889 Other specified disorders of kidney and ureter: Secondary | ICD-10-CM | POA: Diagnosis present

## 2019-10-16 DIAGNOSIS — Z20822 Contact with and (suspected) exposure to covid-19: Secondary | ICD-10-CM | POA: Diagnosis present

## 2019-10-16 DIAGNOSIS — G4733 Obstructive sleep apnea (adult) (pediatric): Secondary | ICD-10-CM | POA: Diagnosis present

## 2019-10-16 DIAGNOSIS — Z9119 Patient's noncompliance with other medical treatment and regimen: Secondary | ICD-10-CM | POA: Diagnosis not present

## 2019-10-16 DIAGNOSIS — K316 Fistula of stomach and duodenum: Secondary | ICD-10-CM | POA: Diagnosis not present

## 2019-10-16 DIAGNOSIS — R9431 Abnormal electrocardiogram [ECG] [EKG]: Secondary | ICD-10-CM | POA: Diagnosis present

## 2019-10-16 DIAGNOSIS — Z86718 Personal history of other venous thrombosis and embolism: Secondary | ICD-10-CM

## 2019-10-16 DIAGNOSIS — K226 Gastro-esophageal laceration-hemorrhage syndrome: Secondary | ICD-10-CM | POA: Diagnosis present

## 2019-10-16 DIAGNOSIS — F419 Anxiety disorder, unspecified: Secondary | ICD-10-CM | POA: Diagnosis present

## 2019-10-16 DIAGNOSIS — K228 Other specified diseases of esophagus: Secondary | ICD-10-CM | POA: Diagnosis not present

## 2019-10-16 DIAGNOSIS — E213 Hyperparathyroidism, unspecified: Secondary | ICD-10-CM | POA: Diagnosis present

## 2019-10-16 DIAGNOSIS — I82890 Acute embolism and thrombosis of other specified veins: Secondary | ICD-10-CM | POA: Diagnosis present

## 2019-10-16 DIAGNOSIS — E871 Hypo-osmolality and hyponatremia: Secondary | ICD-10-CM | POA: Diagnosis present

## 2019-10-16 DIAGNOSIS — K920 Hematemesis: Secondary | ICD-10-CM | POA: Diagnosis not present

## 2019-10-16 DIAGNOSIS — D638 Anemia in other chronic diseases classified elsewhere: Secondary | ICD-10-CM | POA: Diagnosis present

## 2019-10-16 DIAGNOSIS — E119 Type 2 diabetes mellitus without complications: Secondary | ICD-10-CM | POA: Diagnosis present

## 2019-10-16 DIAGNOSIS — Z6841 Body Mass Index (BMI) 40.0 and over, adult: Secondary | ICD-10-CM

## 2019-10-16 DIAGNOSIS — E041 Nontoxic single thyroid nodule: Secondary | ICD-10-CM | POA: Diagnosis present

## 2019-10-16 DIAGNOSIS — J45909 Unspecified asthma, uncomplicated: Secondary | ICD-10-CM | POA: Diagnosis present

## 2019-10-16 DIAGNOSIS — E538 Deficiency of other specified B group vitamins: Secondary | ICD-10-CM | POA: Diagnosis present

## 2019-10-16 DIAGNOSIS — F319 Bipolar disorder, unspecified: Secondary | ICD-10-CM | POA: Diagnosis present

## 2019-10-16 DIAGNOSIS — Z794 Long term (current) use of insulin: Secondary | ICD-10-CM | POA: Diagnosis not present

## 2019-10-16 DIAGNOSIS — K859 Acute pancreatitis without necrosis or infection, unspecified: Secondary | ICD-10-CM | POA: Diagnosis present

## 2019-10-16 DIAGNOSIS — Z87442 Personal history of urinary calculi: Secondary | ICD-10-CM

## 2019-10-16 DIAGNOSIS — Z9689 Presence of other specified functional implants: Secondary | ICD-10-CM | POA: Diagnosis present

## 2019-10-16 DIAGNOSIS — K8592 Acute pancreatitis with infected necrosis, unspecified: Secondary | ICD-10-CM | POA: Diagnosis not present

## 2019-10-16 DIAGNOSIS — Z79899 Other long term (current) drug therapy: Secondary | ICD-10-CM

## 2019-10-16 LAB — URINALYSIS, ROUTINE W REFLEX MICROSCOPIC
Bilirubin Urine: NEGATIVE
Glucose, UA: NEGATIVE mg/dL
Hgb urine dipstick: NEGATIVE
Ketones, ur: NEGATIVE mg/dL
Nitrite: NEGATIVE
Protein, ur: NEGATIVE mg/dL
Specific Gravity, Urine: 1.015 (ref 1.005–1.030)
pH: 6.5 (ref 5.0–8.0)

## 2019-10-16 LAB — COMPREHENSIVE METABOLIC PANEL
ALT: 14 U/L (ref 0–44)
AST: 27 U/L (ref 15–41)
Albumin: 3.5 g/dL (ref 3.5–5.0)
Alkaline Phosphatase: 72 U/L (ref 38–126)
Anion gap: 14 (ref 5–15)
BUN: 8 mg/dL (ref 6–20)
CO2: 26 mmol/L (ref 22–32)
Calcium: 9.1 mg/dL (ref 8.9–10.3)
Chloride: 94 mmol/L — ABNORMAL LOW (ref 98–111)
Creatinine, Ser: 0.9 mg/dL (ref 0.44–1.00)
GFR calc Af Amer: >60 mL/min (ref >60)
GFR calc non Af Amer: >60 mL/min (ref >60)
Glucose, Bld: 334 mg/dL — ABNORMAL HIGH (ref 70–99)
Potassium: 3.8 mmol/L (ref 3.5–5.1)
Sodium: 134 mmol/L — ABNORMAL LOW (ref 135–145)
Total Bilirubin: 0.3 mg/dL (ref 0.3–1.2)
Total Protein: 7.4 g/dL (ref 6.5–8.1)

## 2019-10-16 LAB — CBC
HCT: 33.7 % — ABNORMAL LOW (ref 36.0–46.0)
Hemoglobin: 10.1 g/dL — ABNORMAL LOW (ref 12.0–15.0)
MCH: 21.8 pg — ABNORMAL LOW (ref 26.0–34.0)
MCHC: 30 g/dL (ref 30.0–36.0)
MCV: 72.8 fL — ABNORMAL LOW (ref 80.0–100.0)
Platelets: 308 10*3/uL (ref 150–400)
RBC: 4.63 MIL/uL (ref 3.87–5.11)
RDW: 16.1 % — ABNORMAL HIGH (ref 11.5–15.5)
WBC: 6.2 10*3/uL (ref 4.0–10.5)
nRBC: 0 % (ref 0.0–0.2)

## 2019-10-16 LAB — SARS CORONAVIRUS 2 BY RT PCR (HOSPITAL ORDER, PERFORMED IN ~~LOC~~ HOSPITAL LAB): SARS Coronavirus 2: NEGATIVE

## 2019-10-16 LAB — LIPASE, BLOOD: Lipase: 22 U/L (ref 11–51)

## 2019-10-16 LAB — URINALYSIS, MICROSCOPIC (REFLEX): RBC / HPF: NONE SEEN RBC/hpf (ref 0–5)

## 2019-10-16 LAB — CBG MONITORING, ED: Glucose-Capillary: 304 mg/dL — ABNORMAL HIGH (ref 70–99)

## 2019-10-16 IMAGING — CT CT ABD-PELV W/ CM
2 of 5 series · 16 of 46 positions shown, 18 images · IV contrast (omnipaque)
Comparison: CT [DATE]

CLINICAL DATA: Vomiting

EXAM:
CT ABDOMEN AND PELVIS WITH CONTRAST
TECHNIQUE: Multidetector CT imaging of the abdomen and pelvis was performed
using the standard protocol following bolus administration of
intravenous contrast.
CONTRAST:  100mL OMNIPAQUE IOHEXOL 300 MG/ML  SOLN

[Series 2: axial st · axial · 0.86mm/px · z∈[-345,+75]mm · 13 of 94 slices shown, 15 images]
[im 5/94  soft-tissue]
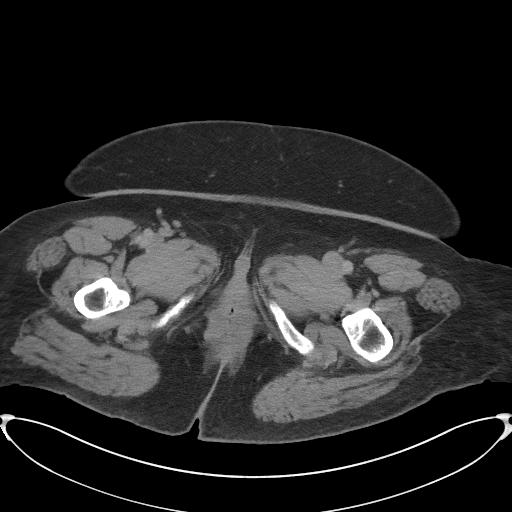
[im 5/94  bone]
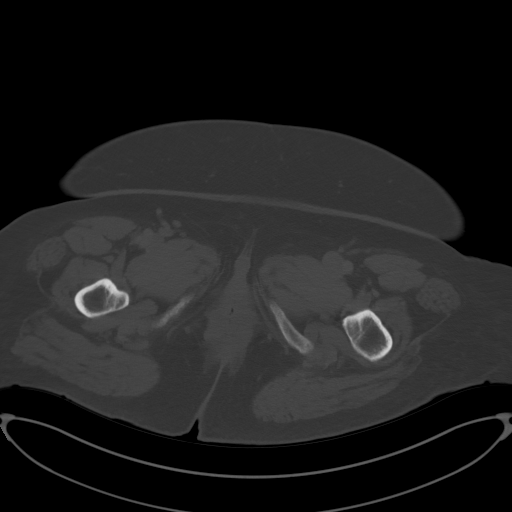
[im 15/94  soft-tissue]
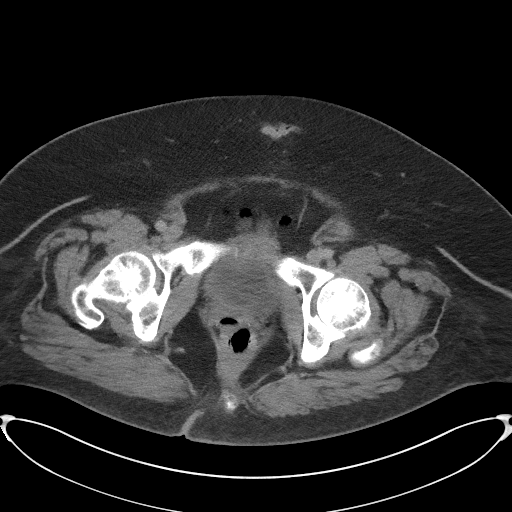
[im 20/94  soft-tissue]
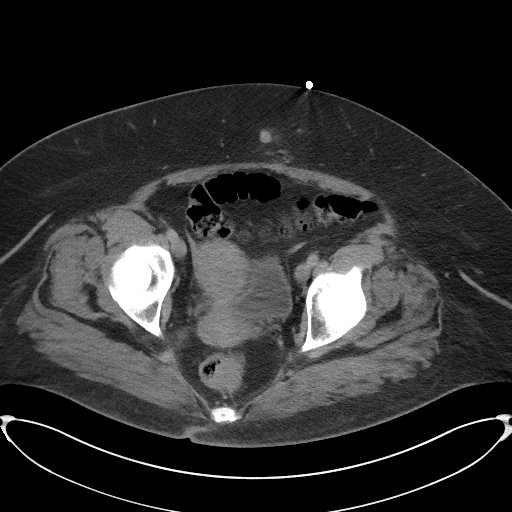
[im 25/94  soft-tissue]
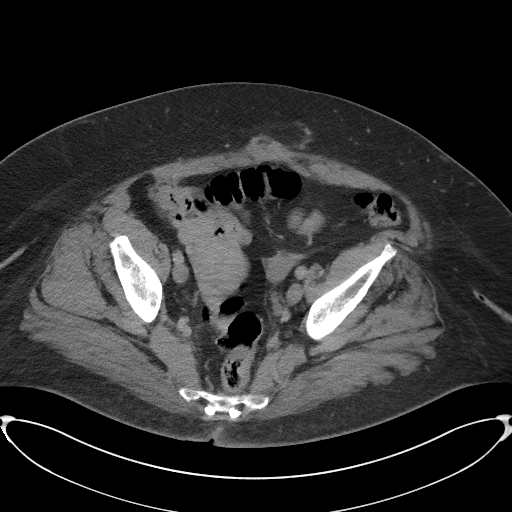
[im 35/94  soft-tissue]
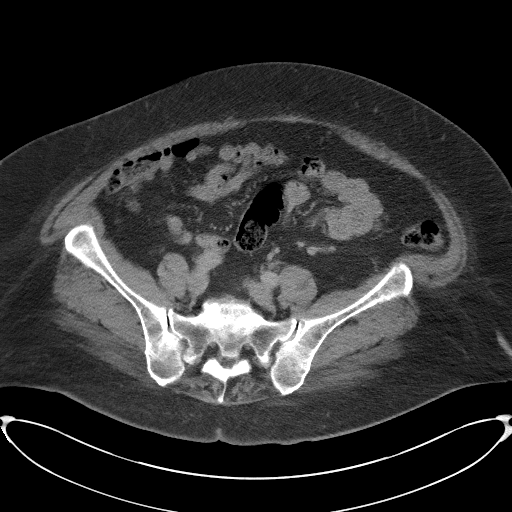
[im 40/94  soft-tissue]
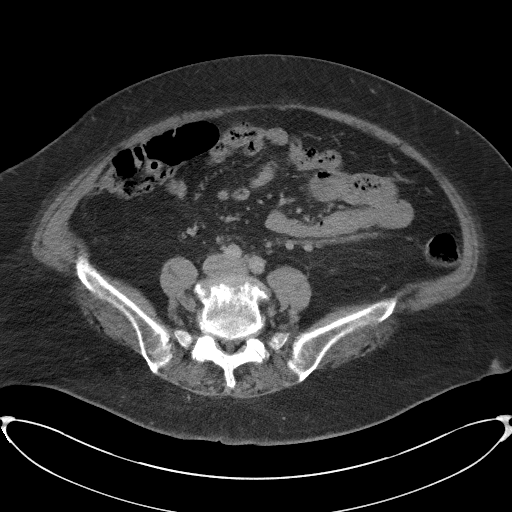
[im 49/94  soft-tissue]
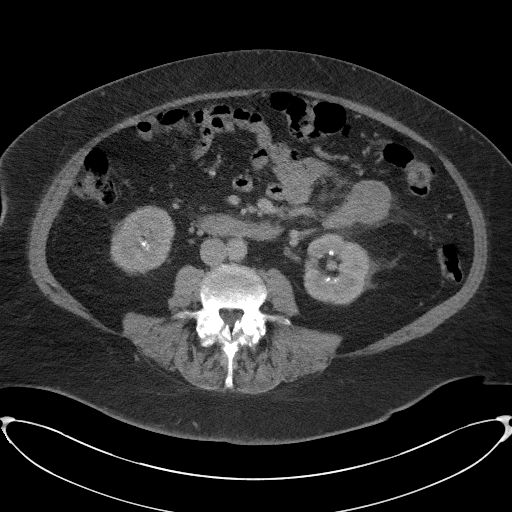
[im 54/94  soft-tissue]
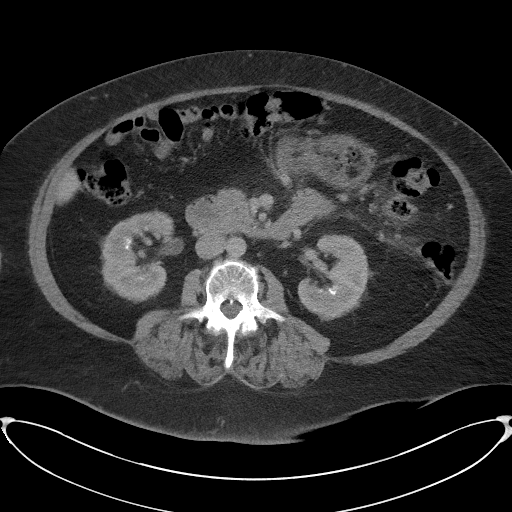
[im 59/94  soft-tissue]
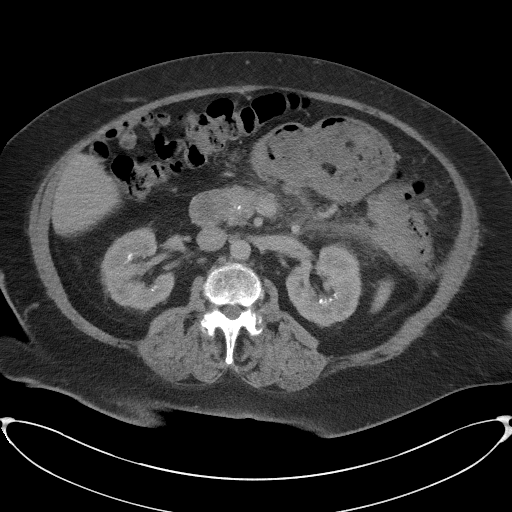
[im 59/94  bone]
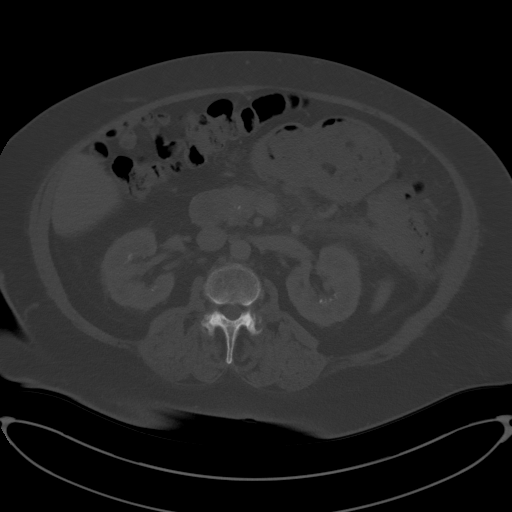
[im 69/94  soft-tissue]
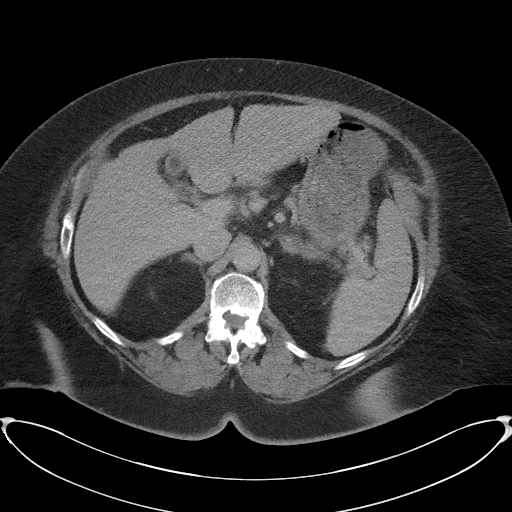
[im 74/94  soft-tissue]
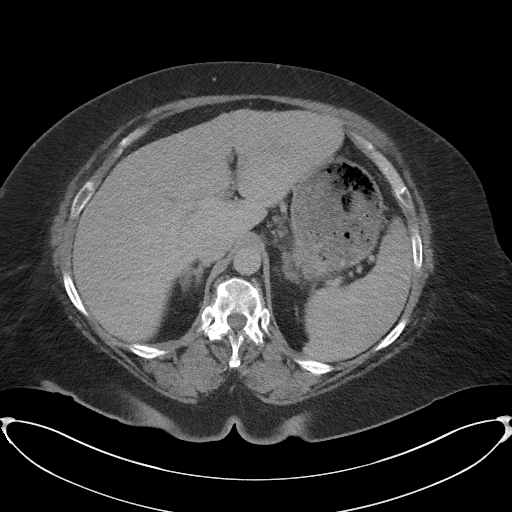
[im 79/94  soft-tissue]
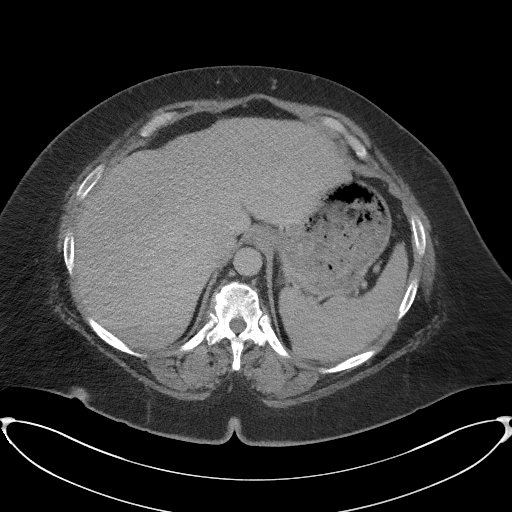
[im 89/94  soft-tissue]
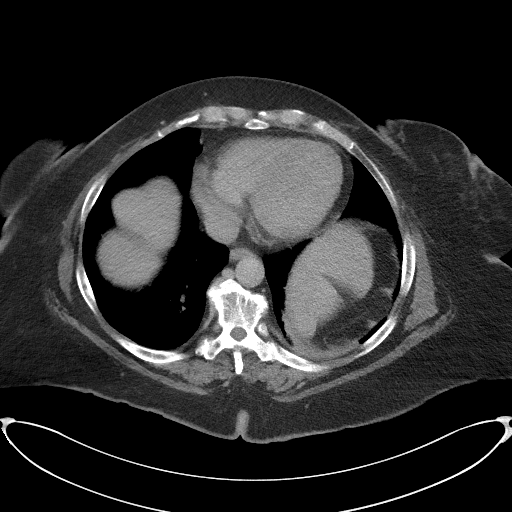

[Series 5: coronal st · coronal · 0.83mm/px · 3 of 113 slices shown]
[im 38/113  soft-tissue]
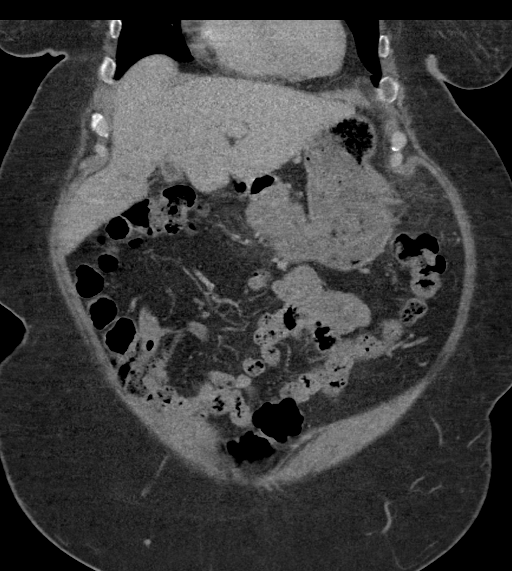
[im 50/113  soft-tissue]
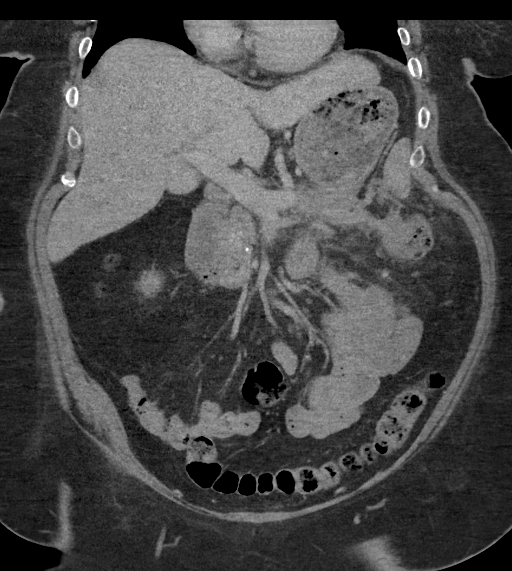
[im 63/113  soft-tissue]
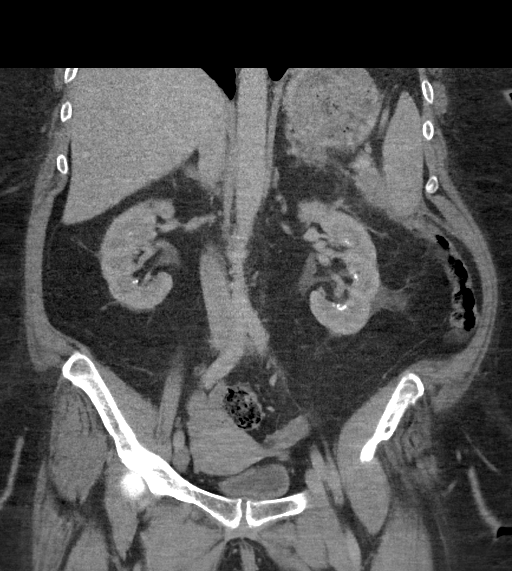

[16 of 46 positions shown; findings below may reference images not displayed]

FINDINGS: Lower chest: Lung bases demonstrate mild atelectasis at the left
base. Borderline cardiomegaly.

Hepatobiliary: Contracted gallbladder without definitive calcified
stone. Wall is slightly thickened but likely due to contraction. No
biliary dilatation. No focal hepatic abnormality.

Pancreas: Multiple calcifications consistent with chronic
pancreatitis. Moderate inflammatory changes about the body and tail
of pancreas consistent with acute pancreatitis. No organized fluid
collections at this time. No pancreatic gas.

Spleen: Enlarged measuring 16 cm

Adrenals/Urinary Tract: Adrenal glands are normal. Numerous
intrarenal calcifications bilaterally. No hydronephrosis. The
bladder is unremarkable

Stomach/Bowel: The stomach is nonenlarged. No dilated small bowel.
Mild wall thickening of the splenic flexure colon. Status post
appendectomy.

Vascular/Lymphatic: Mild aortic atherosclerosis without aneurysm.
Patent portal vessels. The splenic vein appears occluded. Mild
collateral vessels are noted. No suspicious adenopathy.

Reproductive: Uterus and bilateral adnexa are unremarkable.

Other: Negative for free air. Inflammatory mass anterior to tail of
pancreas measuring 3.4 cm.

Musculoskeletal: 11 mm anterolisthesis L4 on L5 with chronic
bilateral pars defect. Degenerative changes at L4-L5.
IMPRESSION: 1. Acute on chronic pancreatitis with considerable inflammatory
change about the body and tail of the pancreas. No rim enhancing or
well organized focal fluid collections at this time. 3.4 cm
inflammatory mass anterior to the tail of pancreas could represent
of phlegmon.
2. Splenomegaly with suspected occlusion of the splenic vein.
Portosystemic collateral vessels are noted
3. Thickening of the splenic flexure of the colon likely reactive to
inflammatory changes at the tail of the pancreas
4. Numerous renal calcifications without hydronephrosis

## 2019-10-16 MED ORDER — HYDROMORPHONE HCL 1 MG/ML IJ SOLN
1.0000 mg | Freq: Once | INTRAMUSCULAR | Status: AC
  Administered 2019-10-16: 1 mg via INTRAVENOUS
  Filled 2019-10-16: qty 1

## 2019-10-16 MED ORDER — SODIUM CHLORIDE 0.9 % IV BOLUS
1000.0000 mL | Freq: Once | INTRAVENOUS | Status: AC
  Administered 2019-10-16: 1000 mL via INTRAVENOUS

## 2019-10-16 MED ORDER — SODIUM CHLORIDE 0.9 % IV SOLN: INTRAVENOUS | Status: DC

## 2019-10-16 MED ORDER — SODIUM CHLORIDE 0.9% FLUSH
3.0000 mL | Freq: Once | INTRAVENOUS | Status: AC
  Administered 2019-10-16: 3 mL via INTRAVENOUS
  Filled 2019-10-16: qty 3

## 2019-10-16 MED ORDER — ONDANSETRON HCL 4 MG/2ML IJ SOLN
4.0000 mg | Freq: Once | INTRAMUSCULAR | Status: AC
  Administered 2019-10-16: 4 mg via INTRAVENOUS
  Filled 2019-10-16: qty 2

## 2019-10-16 MED ORDER — INSULIN ASPART 100 UNIT/ML ~~LOC~~ SOLN
0.0000 [IU] | SUBCUTANEOUS | Status: DC
  Administered 2019-10-17 (×2): 2 [IU] via SUBCUTANEOUS
  Administered 2019-10-17 (×2): 3 [IU] via SUBCUTANEOUS
  Administered 2019-10-17: 7 [IU] via SUBCUTANEOUS
  Administered 2019-10-17 – 2019-10-18 (×2): 3 [IU] via SUBCUTANEOUS
  Administered 2019-10-18: 2 [IU] via SUBCUTANEOUS
  Administered 2019-10-18: 5 [IU] via SUBCUTANEOUS
  Administered 2019-10-18: 3 [IU] via SUBCUTANEOUS
  Administered 2019-10-18: 1 [IU] via SUBCUTANEOUS
  Administered 2019-10-18 – 2019-10-19 (×5): 2 [IU] via SUBCUTANEOUS
  Filled 2019-10-16: qty 0

## 2019-10-16 MED ORDER — HYDROMORPHONE HCL 1 MG/ML IJ SOLN
1.0000 mg | INTRAMUSCULAR | Status: DC | PRN
  Administered 2019-10-17: 1 mg via INTRAVENOUS
  Filled 2019-10-16: qty 1

## 2019-10-16 MED ORDER — IOHEXOL 300 MG/ML  SOLN
100.0000 mL | Freq: Once | INTRAMUSCULAR | Status: AC | PRN
  Administered 2019-10-16: 100 mL via INTRAVENOUS

## 2019-10-16 NOTE — ED Triage Notes (Signed)
Pt reports hx of pancreatitis. Reports flare up that started tonight. Vomiting x 3 tonight. Denies diarrhea. Denies ETOH.

## 2019-10-16 NOTE — ED Provider Notes (Signed)
[facility] EMERGENCY DEPARTMENT Provider Note   CSN: [id number] Arrival date & time: 10/16/19  2013     History Chief Complaint  Patient presents with  . Abdominal Pain    Monique Holland is a 54 y.o. female.  Patient is a 54 year old female with a history of hypertension, schizophrenia, diabetes, prior pancreatitis with pseudocyst and prior necrotizing pancreatitis who usually has normal lipase is presenting today with sudden onset of abdominal pain and vomiting.  Patient states around 530 she started to have pain in the upper abdomen and left upper quadrant and has vomited 3 times.  She states it looked a little bit bloody.  She her last bowel movement was yesterday and reports that it is always hard and constipated.  She normally has bowel movements 2 times a week.  She denies any alcohol use and she has not recently changed any medications.  Because the vomiting had continued and the pain was no better she came to the emergency room because it felt like her prior episodes of pancreatitis.  She denies any fever, chest pain, shortness of breath.  She denies any urinary symptoms.  The history is provided by the patient.  Abdominal Pain Pain location:  Periumbilical, epigastric and LUQ Pain quality: cramping, gnawing and sharp   Pain radiates to:  Does not radiate Pain severity:  Severe Onset quality:  Sudden Duration:  4 hours Timing:  Constant Progression:  Unchanged Chronicity:  Recurrent Context comment:  Started spontaneously and was not related to eating.  No alcohol use Relieved by:  None tried Worsened by:  Nothing Ineffective treatments:  None tried Associated symptoms: anorexia, constipation, nausea and vomiting   Associated symptoms: no chest pain and no diarrhea        Past Medical History:  Diagnosis Date  . Anxiety   . Asthma   . Bipolar disorder (HCC)   . Depression   . Diabetes mellitus without complication (HCC)   . Frequency of urination   .  GERD (gastroesophageal reflux disease)   . History of asthma    last episode yrs ago  . Hypertension   . OSA (obstructive sleep apnea)    pt had study done oct 2014--  schedule for cpap titrate after kidney stone surgery  . Pancreatitis   . Pseudocyst of pancreas   . Schizophrenia (HCC)   . Ureteral calculi    BILATERAL  . Wears glasses     Patient Active Problem List   Diagnosis Date Noted  . Hyperlipidemia 10/01/2019  . OSA (obstructive sleep apnea) 10/01/2019  . GERD (gastroesophageal reflux disease) 10/01/2019  . Right sided weakness 09/29/2019  . Stroke-like episode s/p tPA 09/29/2019  . History of pancreatitis   . Intractable abdominal pain 09/15/2019  . Hyperosmolar hyperglycemic state (HHS) (HCC) 09/15/2019  . Non-intractable vomiting   . Hematemesis with nausea   . Abdominal pain 08/03/2019  . Essential hypertension 08/03/2019  . Pancreatic pseudocyst   . Nausea and vomiting in adult   . Necrotizing pancreatitis   . Gastritis and gastroduodenitis   . Candida esophagitis (HCC)   . Acute pancreatitis 07/17/2019  . Schizophrenia (HCC) 09/02/2018  . Obesity, Class III, BMI 40-49.9 (morbid obesity) (HCC) 09/02/2018  . Iron deficiency 09/02/2018  . Symptomatic anemia 09/01/2018  . Hypokalemia 09/01/2018  . Severe sepsis (HCC) 08/11/2018  . AKI (acute kidney injury) (HCC) 08/11/2018  . Hypotension 08/11/2018  . Type 2 diabetes mellitus (HCC) 08/11/2018  . Acute appendicitis 05/22/2014  .  Ureteral calculus, right 05/02/2013    Past Surgical History:  Procedure Laterality Date  . BALLOON DILATION N/A 08/23/2019   Procedure: BALLOON DILATION;  Surgeon: Meridee Score Netty Starring., MD;  Location: [facility];  Service: Gastroenterology;  Laterality: N/A;  . BIOPSY  07/18/2019   Procedure: BIOPSY;  Surgeon: Shellia Cleverly, DO;  Location: [facility];  Service: Gastroenterology;;  . BIOPSY  08/23/2019   Procedure: BIOPSY;  Surgeon: Lemar Lofty., MD;   Location: [facility];  Service: Gastroenterology;;  . BIOPSY  09/18/2019   Procedure: BIOPSY;  Surgeon: Lemar Lofty., MD;  Location: [facility];  Service: Gastroenterology;;  . CESAREAN SECTION  1991  &  2002   w/ bilateral tubal ligation in 2002  . CYSTOSCOPY W/ URETERAL STENT PLACEMENT Right 08/10/2018   Procedure: CYSTOSCOPY WITH RETROGRADE PYELOGRAM/URETERAL STENT PLACEMENT;  Surgeon: Jerilee Field, MD;  Location: [facility];  Service: Urology;  Laterality: Right;  . CYSTOSCOPY WITH RETROGRADE PYELOGRAM, URETEROSCOPY AND STENT PLACEMENT Bilateral 05/02/2013   Procedure: CYSTOSCOPY WITH RETROGRADE PYELOGRAM, URETEROSCOPY ;  Surgeon: Valetta Fuller, MD;  Location: [facility];  Service: Urology;  Laterality: Bilateral;  . CYSTOSCOPY WITH RETROGRADE PYELOGRAM, URETEROSCOPY AND STENT PLACEMENT Right 08/15/2018   Procedure: CYSTOSCOPY WITH RIGHT RETROGRADE PYELOGRAM, RIGHT URETEROSCOPY WITH HOLMIUM LASER AND STENT PLACEMENT;  Surgeon: Crista Elliot, MD;  Location: [facility];  Service: Urology;  Laterality: Right;  . CYSTOSCOPY WITH STENT PLACEMENT Left 05/02/2013   Procedure: CYSTOSCOPY WITH STENT PLACEMENT;  Surgeon: Valetta Fuller, MD;  Location: [facility];  Service: Urology;  Laterality: Left;  . ENDOROTOR N/A 08/26/2019   Procedure: [id number];  Surgeon: Mansouraty, Netty Starring., MD;  Location: [facility];  Service: Gastroenterology;  Laterality: N/A;  . ENDOROTOR  09/05/2019   Procedure: [id number];  Surgeon: Mansouraty, Netty Starring., MD;  Location: [facility];  Service: Gastroenterology;;  . ESOPHAGOGASTRODUODENOSCOPY N/A 08/26/2019   Procedure: ESOPHAGOGASTRODUODENOSCOPY (EGD) with NECROSECTOMY;  Surgeon: Lemar Lofty., MD;  Location: [facility]COPY;  Service: Gastroenterology;  Laterality: N/A;  . ESOPHAGOGASTRODUODENOSCOPY (EGD) WITH PROPOFOL N/A 07/18/2019   Procedure: ESOPHAGOGASTRODUODENOSCOPY (EGD) WITH PROPOFOL;  Surgeon: Shellia Cleverly, DO;  Location: [facility];  Service: Gastroenterology;  Laterality: N/A;  . ESOPHAGOGASTRODUODENOSCOPY (EGD) WITH PROPOFOL N/A 08/18/2019   Procedure: ESOPHAGOGASTRODUODENOSCOPY (EGD) WITH PROPOFOL;  Surgeon: Hilarie Fredrickson, MD;  Location: [facility];  Service: Endoscopy;  Laterality: N/A;  . ESOPHAGOGASTRODUODENOSCOPY (EGD) WITH PROPOFOL N/A 08/21/2019   Procedure: ESOPHAGOGASTRODUODENOSCOPY (EGD) WITH PROPOFOL;  Surgeon: Meridee Score Netty Starring., MD;  Location: [facility];  Service: Gastroenterology;  Laterality: N/A;  . ESOPHAGOGASTRODUODENOSCOPY (EGD) WITH PROPOFOL N/A 08/23/2019   Procedure: ESOPHAGOGASTRODUODENOSCOPY (EGD) WITH PROPOFOL;  Surgeon: Meridee Score Netty Starring., MD;  Location: [facility];  Service: Gastroenterology;  Laterality: N/A;  . ESOPHAGOGASTRODUODENOSCOPY (EGD) WITH PROPOFOL N/A 09/05/2019   Procedure: ESOPHAGOGASTRODUODENOSCOPY (EGD) WITH PROPOFOL;  Surgeon: Meridee Score Netty Starring., MD;  Location: [facility] ENDOSCOPY;  Service: Gastroenterology;  Laterality: N/A;   With pancreatic pseudocyst necrosectomy via established cyst gastrostomy  . ESOPHAGOGASTRODUODENOSCOPY (EGD) WITH PROPOFOL N/A 09/18/2019   Procedure: ESOPHAGOGASTRODUODENOSCOPY (EGD) WITH PROPOFOL;  Surgeon: Meridee Score Netty Starring., MD;  Location: [facility];  Service: Gastroenterology;  Laterality: N/A;  . EUS  08/23/2019   Procedure: UPPER ENDOSCOPIC ULTRASOUND (EUS) LINEAR;  Surgeon: Lemar Lofty., MD;  Location: [facility];  Service: Gastroenterology;;  . FOREIGN BODY REMOVAL  09/05/2019   Procedure: FOREIGN BODY REMOVAL;  Surgeon: Lemar Lofty., MD;  Location: [facility];  Service: Gastroenterology;;  .  FOREIGN BODY REMOVAL  09/18/2019   Procedure: FOREIGN BODY REMOVAL;  Surgeon: Meridee Score Netty Starring., MD;  Location: [facility];  Service: Gastroenterology;;  . HOLMIUM LASER APPLICATION Bilateral 05/02/2013   Procedure: HOLMIUM LASER APPLICATION;  Surgeon: Valetta Fuller, MD;   Location: [facility];  Service: Urology;  Laterality: Bilateral;  . LAPAROSCOPIC APPENDECTOMY N/A 05/22/2014   Procedure: APPENDECTOMY LAPAROSCOPIC;  Surgeon: Glenna Fellows, MD;  Location: [facility];  Service: General;  Laterality: N/A;  . PANCREATIC STENT PLACEMENT  08/23/2019   Procedure: PANCREATIC STENT PLACEMENT;  Surgeon: Lemar Lofty., MD;  Location: [facility];  Service: Gastroenterology;;  . PANCREATIC STENT PLACEMENT  09/05/2019   Procedure: PANCREATIC STENT PLACEMENT;  Surgeon: Lemar Lofty., MD;  Location: [facility] END[facility]COPY;  Service: Gastroenterology;;  . PANCREATIC STENT PLACEMENT  09/18/2019   Procedure: PANCREATIC STENT PLACEMENT;  Surgeon: Lemar Lofty., MD;  Location: [facility];  Service: Gastroenterology;;  cyst gastrostomy double pigtail stent placement   . STENT REMOVAL  08/26/2019   Procedure: STENT REMOVAL;  Surgeon: Lemar Lofty., MD;  Location: [facility] ENDOSCOPY;  Service: Gastroenterology;;  . Francine Graven REMOVAL  09/05/2019   Procedure: STENT REMOVAL;  Surgeon: Lemar Lofty., MD;  Location: [facility] ENDOSCOPY;  Service: Gastroenterology;;  . Francine Graven REMOVAL  09/18/2019   Procedure: STENT REMOVAL;  Surgeon: Lemar Lofty., MD;  Location: [facility];  Service: Gastroenterology;;  . TONSILLECTOMY  age 48  . UPPER ESOPHAGEAL ENDOSCOPIC ULTRASOUND (EUS) N/A 08/21/2019   Procedure: UPPER ESOPHAGEAL ENDOSCOPIC ULTRASOUND (EUS);  Surgeon: Lemar Lofty., MD;  Location: [facility];  Service: Gastroenterology;  Laterality: N/A;  For cyst gastrostomy     OB History   No obstetric history on file.     Family History  Family history unknown: Yes    Social History   Tobacco Use  . Smoking status: Never Smoker  . Smokeless tobacco: Never Used  Substance Use Topics  . Alcohol use: No  . Drug use: No    Home Medications Prior to Admission medications   Medication Sig Start Date End Date Taking? Authorizing  Provider  albuterol (VENTOLIN HFA) 108 (90 Base) MCG/ACT inhaler Inhale 1-2 puffs into the lungs every 6 (six) hours as needed for wheezing or shortness of breath. 07/21/19   Regalado, Belkys A, MD  amitriptyline (ELAVIL) 75 MG tablet Take 75 mg by mouth at bedtime. 08/10/19   [provider]  amLODipine (NORVASC) 5 MG tablet Take 5 mg by mouth daily.    [provider]  atorvastatin (LIPITOR) 40 MG tablet Take 40 mg by mouth daily.    [provider]  clonazePAM (KLONOPIN) 0.5 MG tablet Take 0.5 mg by mouth daily.  05/23/19   [provider]  JANUMET 50-1000 MG tablet Take 1 tablet by mouth 2 (two) times daily. 09/24/19   [provider]  lamoTRIgine (LAMICTAL) 100 MG tablet Take 100 mg by mouth daily. 09/22/19   [provider]  LANTUS SOLOSTAR 100 UNIT/ML Solostar Pen Inject 40 Units into the skin at bedtime. 10/01/19   Layne Benton, NP  LATUDA 60 MG TABS Take 1 tablet by mouth at bedtime. 08/10/19   [provider]  losartan (COZAAR) 25 MG tablet Take 25 mg by mouth daily.    [provider]  ondansetron (ZOFRAN) 8 MG tablet Take 1 tablet (8 mg total) by mouth every 8 (eight) hours as needed for nausea or vomiting. 08/19/19   Regalado, Prentiss Bells, MD  Oxycodone  HCl 10 MG TABS Take 10 mg by mouth every 4 (four) hours as needed (pain).  08/12/19   [provider]  pantoprazole (PROTONIX) 40 MG tablet Take 40 mg by mouth daily.     [provider]  propranolol (INDERAL) 20 MG tablet Take 20 mg by mouth 2 (two) times daily.    [provider]  risperiDONE (RISPERDAL) 0.5 MG tablet Take 0.5 mg by mouth 2 (two) times daily. 09/22/19   [provider]  traZODone (DESYREL) 100 MG tablet Take 100 mg by mouth at bedtime.    [provider]  TRULICITY 4.5 OE/4.2PN SOPN Inject 4.5 mg into the skin every Wednesday.  07/29/19   [provider]    Allergies    Patient has no known  allergies.  Review of Systems   Review of Systems  Cardiovascular: Negative for chest pain.  Gastrointestinal: Positive for abdominal pain, anorexia, constipation, nausea and vomiting. Negative for diarrhea.  All other systems reviewed and are negative.   Physical Exam Updated Vital Signs BP (!) 188/117 (BP Location: Left Arm)   Pulse (!) 115   Temp 98.9 F (37.2 C) (Oral)   Resp 20   Ht 5\' 8"  (1.727 m)   Wt 128.8 kg   LMP 03/23/2014 Comment: (-) u preg?/ac  SpO2 98%   BMI 43.18 kg/m   Physical Exam Vitals and nursing note reviewed.  Constitutional:      General: She is not in acute distress.    Appearance: She is well-developed. She is obese.  HENT:     Head: Normocephalic and atraumatic.     Mouth/Throat:     Mouth: Mucous membranes are moist.  Eyes:     Pupils: Pupils are equal, round, and reactive to light.  Cardiovascular:     Rate and Rhythm: Normal rate and regular rhythm.     Heart sounds: Normal heart sounds. No murmur. No friction rub.  Pulmonary:     Effort: Pulmonary effort is normal.     Breath sounds: Normal breath sounds. No wheezing or rales.  Abdominal:     General: Bowel sounds are normal. There is no distension.     Palpations: Abdomen is soft.     Tenderness: There is abdominal tenderness in the epigastric area, periumbilical area and left upper quadrant. There is guarding. There is no rebound.  Musculoskeletal:        General: No tenderness. Normal range of motion.     Right lower leg: No edema.     Left lower leg: No edema.     Comments: No edema  Skin:    General: Skin is warm and dry.     Findings: No rash.  Neurological:     Mental Status: She is alert and oriented to person, place, and time. Mental status is at baseline.     Cranial Nerves: No cranial nerve deficit.  Psychiatric:        Behavior: Behavior normal.     ED Results / Procedures / Treatments   Labs (all labs ordered are listed, but only abnormal results are  displayed) Labs Reviewed  COMPREHENSIVE METABOLIC PANEL - Abnormal; Notable for the following components:      Result Value   Sodium 134 (*)    Chloride 94 (*)    Glucose, Bld 334 (*)    All other components within normal limits  CBC - Abnormal; Notable for the following components:   Hemoglobin 10.1 (*)    HCT 33.7 (*)  MCV 72.8 (*)    MCH 21.8 (*)    RDW 16.1 (*)    All other components within normal limits  URINALYSIS, ROUTINE W REFLEX MICROSCOPIC - Abnormal; Notable for the following components:   Leukocytes,Ua TRACE (*)    All other components within normal limits  URINALYSIS, MICROSCOPIC (REFLEX) - Abnormal; Notable for the following components:   Bacteria, UA RARE (*)    All other components within normal limits  LIPASE, BLOOD    EKG None  Radiology CT ABDOMEN PELVIS W CONTRAST  Result Date: 10/16/2019 CLINICAL DATA:  Vomiting EXAM: CT ABDOMEN AND PELVIS WITH CONTRAST TECHNIQUE: Multidetector CT imaging of the abdomen and pelvis was performed using the standard protocol following bolus administration of intravenous contrast. CONTRAST:  OMNIPAQUE IOHEXOL 300 MG/ML  SOLN COMPARISON:  CT 09/10/2019 FINDINGS: Lower chest: Lung bases demonstrate mild atelectasis at the left base. Borderline cardiomegaly. Hepatobiliary: Contracted gallbladder without definitive calcified stone. Wall is slightly thickened but likely due to contraction. No biliary dilatation. No focal hepatic abnormality. Pancreas: Multiple calcifications consistent with chronic pancreatitis. Moderate inflammatory changes about the body and tail of pancreas consistent with acute pancreatitis. No organized fluid collections at this time. No pancreatic gas. Spleen: Enlarged measuring 16 cm Adrenals/Urinary Tract: Adrenal glands are normal. Numerous intrarenal calcifications bilaterally. No hydronephrosis. The bladder is unremarkable Stomach/Bowel: The stomach is nonenlarged. No dilated small bowel. Mild wall  thickening of the splenic flexure colon. Status post appendectomy. Vascular/Lymphatic: Mild aortic atherosclerosis without aneurysm. Patent portal vessels. The splenic vein appears occluded. Mild collateral vessels are noted. No suspicious adenopathy. Reproductive: Uterus and bilateral adnexa are unremarkable. Other: Negative for free air. Inflammatory mass anterior to tail of pancreas measuring 3.4 cm. Musculoskeletal: 11 mm anterolisthesis L4 on L5 with chronic bilateral pars defect. Degenerative changes at L4-L5. IMPRESSION: 1. Acute on chronic pancreatitis with considerable inflammatory change about the body and tail of the pancreas. No rim enhancing or well organized focal fluid collections at this time. 3.4 cm inflammatory mass anterior to the tail of pancreas could represent of phlegmon. 2. 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 Portosystemic collateral vessels are noted 3. Thickening of the splenic flexure of the colon likely reactive to inflammatory changes at the tail of the pancreas 4. Numerous renal calcifications without hydronephrosis Electronically Signed   By: Jasmine Pang M.D.   On: 10/16/2019 21:57    Procedures Procedures (including critical care time)  Medications Ordered in ED Medications  HYDROmorphone (DILAUDID) injection 1 mg (has no administration in time range)  sodium chloride flush (NS) 0.9 % injection 3 mL (3 mLs Intravenous Given 10/16/19 2111)  ondansetron (ZOFRAN) injection 4 mg (4 mg Intravenous Given 10/16/19 2110)  sodium chloride 0.9 % bolus 1,000 mL (1,000 mLs Intravenous New Bag/Given 10/16/19 2110)    ED Course  I have reviewed the triage vital signs and the nursing notes.  Pertinent labs & imaging results that were available during my care of the patient were reviewed by me and considered in my medical decision making (see chart for details).    MDM Rules/Calculators/A&P                      Patient is a 54 year old female with multiple  medical problems presenting today with abdominal pain and vomiting for the last 4 hours.  Patient has a prior history of necrotizing pancreatitis with pseudocyst.  Patient states symptoms feel similar to the past.  She does not drink alcohol  and denies any medication changes.  She has no urinary symptoms or respiratory/cardiac symptoms.  Patient's labs show normal UA, CBC with stable anemia, CMP except for hyperglycemia and lipase.  Interestingly patient has had a normal lipase multiple other encounters.  Patient was given IV fluids, pain and nausea medication.  Will repeat CT to reevaluate.  10:39 PM Patient continues to have persistent pain which she reports is severe.  The nausea has improved.  CT showing acute on chronic pancreatitis with considerable inflammatory changes about the body and tail of the pancreas.  No rim-enhancing or well organized fluid collection at this time.  3.4 cm inflammatory mass anterior to the tail of the pancreas could represent phlegmon.  She also has localized reactive changes.  Given patient's ongoing severe pain and CT findings will admit for further care.  MDM Number of Diagnoses or Management Options   Amount and/or Complexity of Data Reviewed Clinical lab tests: ordered and reviewed Tests in the radiology section of CPT: ordered and reviewed Decide to obtain previous medical records or to obtain history from someone other than the patient: yes Review and summarize past medical records: yes Independent visualization of images, tracings, or specimens: yes  Risk of Complications, Morbidity, and/or Mortality Presenting problems: moderate Diagnostic procedures: low Management options: low  Patient Progress Patient progress: stable    Final Clinical Impression(s) / ED Diagnoses Final diagnoses:  Acute pancreatitis without infection or necrosis, unspecified pancreatitis type    Rx / DC Orders ED Discharge Orders    None       Gwyneth SproutPlunkett, Donley Harland,  MD 10/16/19 2241

## 2019-10-16 NOTE — ED Notes (Signed)
Pt transported to CT scan.

## 2019-10-16 NOTE — ED Notes (Signed)
Spoke with Selena Batten at [facility] for hospitalist consult for [employer].

## 2019-10-16 NOTE — Plan of Care (Signed)
54 yo   hypertension, schizophrenia, diabetes, prior pancreatitis with pseudocyst and prior necrotizing pancreatitis  presented with abd pain and vomiting no vomiting Lipase is normal but CT showing inflammation She has been seen by [facility] Has been vaccinated. Admit to [facility] Asked for ECG if QT prolonged would change to tele  Monique Holland 11:08 PM

## 2019-10-17 DIAGNOSIS — K8592 Acute pancreatitis with infected necrosis, unspecified: Secondary | ICD-10-CM

## 2019-10-17 DIAGNOSIS — R9431 Abnormal electrocardiogram [ECG] [EKG]: Secondary | ICD-10-CM | POA: Diagnosis present

## 2019-10-17 DIAGNOSIS — K859 Acute pancreatitis without necrosis or infection, unspecified: Principal | ICD-10-CM

## 2019-10-17 LAB — BASIC METABOLIC PANEL
Anion gap: 12 (ref 5–15)
BUN: 8 mg/dL (ref 6–20)
CO2: 27 mmol/L (ref 22–32)
Calcium: 8.4 mg/dL — ABNORMAL LOW (ref 8.9–10.3)
Chloride: 98 mmol/L (ref 98–111)
Creatinine, Ser: 0.75 mg/dL (ref 0.44–1.00)
GFR calc Af Amer: >60 mL/min (ref >60)
GFR calc non Af Amer: >60 mL/min (ref >60)
Glucose, Bld: 207 mg/dL — ABNORMAL HIGH (ref 70–99)
Potassium: 3.8 mmol/L (ref 3.5–5.1)
Sodium: 137 mmol/L (ref 135–145)

## 2019-10-17 LAB — GLUCOSE, CAPILLARY
Glucose-Capillary: 207 mg/dL — ABNORMAL HIGH (ref 70–99)
Glucose-Capillary: 195 mg/dL — ABNORMAL HIGH (ref 70–99)
Glucose-Capillary: 158 mg/dL — ABNORMAL HIGH (ref 70–99)
Glucose-Capillary: 215 mg/dL — ABNORMAL HIGH (ref 70–99)
Glucose-Capillary: 219 mg/dL — ABNORMAL HIGH (ref 70–99)

## 2019-10-17 LAB — LIPID PANEL
Cholesterol: 168 mg/dL (ref 0–200)
HDL: 42 mg/dL (ref >40)
LDL Cholesterol: 93 mg/dL (ref 0–99)
Total CHOL/HDL Ratio: 4 RATIO
Triglycerides: 163 mg/dL — ABNORMAL HIGH (ref <150)
VLDL: 33 mg/dL (ref 0–40)

## 2019-10-17 LAB — SEDIMENTATION RATE: Sed Rate: 38 mm/hr — ABNORMAL HIGH (ref 0–22)

## 2019-10-17 LAB — PHOSPHORUS: Phosphorus: 5.2 mg/dL — ABNORMAL HIGH (ref 2.5–4.6)

## 2019-10-17 LAB — CBG MONITORING, ED: Glucose-Capillary: 300 mg/dL — ABNORMAL HIGH (ref 70–99)

## 2019-10-17 LAB — C-REACTIVE PROTEIN: CRP: 0.8 mg/dL (ref <1.0)

## 2019-10-17 LAB — VITAMIN B12: Vitamin B-12: 364 pg/mL (ref 180–914)

## 2019-10-17 LAB — RETICULOCYTES
Immature Retic Fract: 27.6 % — ABNORMAL HIGH (ref 2.3–15.9)
RBC.: 4.18 MIL/uL (ref 3.87–5.11)
Retic Count, Absolute: 84.4 10*3/uL (ref 19.0–186.0)
Retic Ct Pct: 2 % (ref 0.4–3.1)

## 2019-10-17 LAB — IRON AND TIBC
Iron: 48 ug/dL (ref 28–170)
Saturation Ratios: 19 % (ref 10.4–31.8)
TIBC: 253 ug/dL (ref 250–450)
UIBC: 205 ug/dL

## 2019-10-17 LAB — MAGNESIUM: Magnesium: 1.8 mg/dL (ref 1.7–2.4)

## 2019-10-17 LAB — HEMOGLOBIN A1C
Hgb A1c MFr Bld: 13.6 % — ABNORMAL HIGH (ref 4.8–5.6)
Mean Plasma Glucose: 343.62 mg/dL

## 2019-10-17 LAB — FOLATE: Folate: 5.3 ng/mL — ABNORMAL LOW (ref >5.9)

## 2019-10-17 LAB — FERRITIN: Ferritin: 160 ng/mL (ref 11–307)

## 2019-10-17 MED ORDER — FOLIC ACID 5 MG/ML IJ SOLN
1.0000 mg | Freq: Every day | INTRAMUSCULAR | Status: DC
  Administered 2019-10-17 – 2019-10-19 (×3): 1 mg via INTRAVENOUS
  Filled 2019-10-17 (×3): qty 0.2

## 2019-10-17 MED ORDER — OXYCODONE HCL 5 MG PO TABS: 10.0000 mg | ORAL_TABLET | ORAL | Status: DC | PRN

## 2019-10-17 MED ORDER — ACETAMINOPHEN 325 MG PO TABS
650.0000 mg | ORAL_TABLET | Freq: Four times a day (QID) | ORAL | Status: DC | PRN
  Administered 2019-10-17: 650 mg via ORAL
  Filled 2019-10-17: qty 2

## 2019-10-17 MED ORDER — LOSARTAN POTASSIUM 25 MG PO TABS
25.0000 mg | ORAL_TABLET | Freq: Every day | ORAL | Status: DC
  Administered 2019-10-17 – 2019-10-19 (×2): 25 mg via ORAL
  Filled 2019-10-17 (×2): qty 1

## 2019-10-17 MED ORDER — PROPRANOLOL HCL 20 MG PO TABS
20.0000 mg | ORAL_TABLET | Freq: Two times a day (BID) | ORAL | Status: DC
  Administered 2019-10-17 – 2019-10-19 (×5): 20 mg via ORAL
  Filled 2019-10-17 (×5): qty 1

## 2019-10-17 MED ORDER — PIPERACILLIN-TAZOBACTAM 3.375 G IVPB
3.3750 g | Freq: Three times a day (TID) | INTRAVENOUS | Status: DC
  Administered 2019-10-17 – 2019-10-19 (×7): 3.375 g via INTRAVENOUS
  Filled 2019-10-17 (×7): qty 50

## 2019-10-17 MED ORDER — PANTOPRAZOLE SODIUM 40 MG IV SOLR
40.0000 mg | Freq: Two times a day (BID) | INTRAVENOUS | Status: DC
  Administered 2019-10-17 – 2019-10-19 (×5): 40 mg via INTRAVENOUS
  Filled 2019-10-17 (×5): qty 40

## 2019-10-17 MED ORDER — SODIUM CHLORIDE 0.9 % IV SOLN: INTRAVENOUS | Status: AC

## 2019-10-17 MED ORDER — AMLODIPINE BESYLATE 5 MG PO TABS
5.0000 mg | ORAL_TABLET | Freq: Every day | ORAL | Status: DC
  Administered 2019-10-17 – 2019-10-19 (×3): 5 mg via ORAL
  Filled 2019-10-17 (×3): qty 1

## 2019-10-17 MED ORDER — ATORVASTATIN CALCIUM 40 MG PO TABS
40.0000 mg | ORAL_TABLET | Freq: Every day | ORAL | Status: DC
  Administered 2019-10-17 – 2019-10-19 (×2): 40 mg via ORAL
  Filled 2019-10-17 (×2): qty 1

## 2019-10-17 NOTE — H&P (Addendum)
History and Physical    Wave Calzada YQI:[id number] DOB: 01/17/1966 DOA: 10/16/2019  PCP: Kristie Cowman, MD  Patient coming from: home  I have personally briefly reviewed patient's old medical records in [place]  Chief Complaint: n/v abdominal pain   HPI: Monique Holland is a 54 y.o. female with medical history significant of   hypertension, obesity, diabetes, sleep apnea, depression, bipolar schizophrenia, esophageal candidiasis,prior pancreatitis with pseudocyst with previous stent and necrotizing pancreatitis noted on last admission 09/14/19 for which patient had gi evaluation with egd and 4/28 with necrosectomy, patient at time was stablized s/p adequate pain control and iv abx which on discharged was transitioned to Augmentin.  Patient now presents to ED with acute onset of abdominal pain and vomiting similar to prior episodes of her pancreatitis. Patient notes her pain is located in her epigastrium and left upper abdomen. She notes possible emesis streaked with blood but no associated diarrhea , black stools or blood in stools. She does at this time note constipation. She denies ETOH intake . Of note patient continues on multiple drugs with side-effect of pancreatitis of most concern is Trulicity. She noes no current chest pain, sob, fever/chills, dysuria, HA, noted weight loss, no cough , no recent uri sxs.  ED Course: Afeb, bp 188/118, hr115, rr20 sat 98%  Wbc:6.2, hgb10.1 baseline 9, mcv 72  Na 134 /base 136, fs 334 , cr 0.9, ast27,alt14, AG 14 Ua negative tx with zofran 1L NA, dilaudid  CT abd/pelvis:  1. Acute on chronic pancreatitis with considerable inflammatory change about the body and tail of the pancreas. No rim enhancing or well organized focal fluid collections at this time. 3.4 cm inflammatory mass anterior to the tail of pancreas could represent of phlegmon. 2. 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 Portosystemic collateral vessels are  noted 3. Thickening of the splenic flexure of the colon likely reactive to inflammatory changes at the tail of the pancreas 4. Numerous renal calcifications without hydronephrosis  Review of Systems: As per HPI otherwise 10 point review of systems negative.   Past Medical History:  Diagnosis Date  . Anxiety   . Asthma   . Bipolar disorder ([place])   . Depression   . Diabetes mellitus without complication ([place])   . Frequency of urination   . GERD (gastroesophageal reflux disease)   . History of asthma    last episode yrs ago  . Hypertension   . OSA (obstructive sleep apnea)    pt had study done oct 2014--  schedule for cpap titrate after kidney stone surgery  . Pancreatitis   . Pseudocyst of pancreas   . Schizophrenia ([place])   . Ureteral calculi    BILATERAL  . Wears glasses     Past Surgical History:  Procedure Laterality Date  . BALLOON DILATION N/A 08/23/2019   Procedure: BALLOON DILATION;  Surgeon: Rush Landmark Telford Nab., MD;  Location: [facility];  Service: Gastroenterology;  Laterality: N/A;  . BIOPSY  07/18/2019   Procedure: BIOPSY;  Surgeon: Lavena Bullion, DO;  Location: [facility];  Service: Gastroenterology;;  . BIOPSY  08/23/2019   Procedure: BIOPSY;  Surgeon: Irving Copas., MD;  Location: [facility];  Service: Gastroenterology;;  . BIOPSY  09/18/2019   Procedure: BIOPSY;  Surgeon: Irving Copas., MD;  Location: [facility];  Service: Gastroenterology;;  . [place]  &  2002   w/ bilateral tubal ligation in 2002  . [place]  Right 08/10/2018   Procedure: CYSTOSCOPY WITH RETROGRADE PYELOGRAM/URETERAL STENT PLACEMENT;  Surgeon: Jerilee Field, MD;  Location: [facility];  Service: Urology;  Laterality: Right;  . CYSTOSCOPY WITH RETROGRADE PYELOGRAM, URETEROSCOPY AND STENT PLACEMENT Bilateral 05/02/2013   Procedure: CYSTOSCOPY WITH RETROGRADE PYELOGRAM, URETEROSCOPY ;  Surgeon: Valetta Fuller, MD;   Location: [facility];  Service: Urology;  Laterality: Bilateral;  . CYSTOSCOPY WITH RETROGRADE PYELOGRAM, URETEROSCOPY AND STENT PLACEMENT Right 08/15/2018   Procedure: CYSTOSCOPY WITH RIGHT RETROGRADE PYELOGRAM, RIGHT URETEROSCOPY WITH HOLMIUM LASER AND STENT PLACEMENT;  Surgeon: Crista Elliot, MD;  Location: [facility];  Service: Urology;  Laterality: Right;  . CYSTOSCOPY WITH STENT PLACEMENT Left 05/02/2013   Procedure: CYSTOSCOPY WITH STENT PLACEMENT;  Surgeon: Valetta Fuller, MD;  Location: [facility];  Service: Urology;  Laterality: Left;  . ENDOROTOR N/A 08/26/2019   Procedure: [id number];  Surgeon: Mansouraty, Netty Starring., MD;  Location: [facility];  Service: Gastroenterology;  Laterality: N/A;  . ENDOROTOR  09/05/2019   Procedure: [id number];  Surgeon: Mansouraty, Netty Starring., MD;  Location: [facility] END[facility];  Service: Gastroenterology;;  . ESOPHAGOGASTRODUODENOSCOPY N/A 08/26/2019   Procedure: ESOPHAGOGASTRODUODENOSCOPY (EGD) with NECROSECTOMY;  Surgeon: Lemar Lofty., MD;  Location: [facility];  Service: Gastroenterology;  Laterality: N/A;  . ESOPHAGOGASTRODUODENOSCOPY (EGD) WITH PROPOFOL N/A 07/18/2019   Procedure: ESOPHAGOGASTRODUODENOSCOPY (EGD) WITH PROPOFOL;  Surgeon: Shellia Cleverly, DO;  Location: [facility];  Service: Gastroenterology;  Laterality: N/A;  . ESOPHAGOGASTRODUODENOSCOPY (EGD) WITH PROPOFOL N/A 08/18/2019   Procedure: ESOPHAGOGASTRODUODENOSCOPY (EGD) WITH PROPOFOL;  Surgeon: Hilarie Fredrickson, MD;  Location: [facility];  Service: Endoscopy;  Laterality: N/A;  . ESOPHAGOGASTRODUODENOSCOPY (EGD) WITH PROPOFOL N/A 08/21/2019   Procedure: ESOPHAGOGASTRODUODENOSCOPY (EGD) WITH PROPOFOL;  Surgeon: Meridee Score Netty Starring., MD;  Location: [facility];  Service: Gastroenterology;  Laterality: N/A;  . ESOPHAGOGASTRODUODENOSCOPY (EGD) WITH PROPOFOL N/A 08/23/2019   Procedure: ESOPHAGOGASTRODUODENOSCOPY (EGD) WITH PROPOFOL;  Surgeon: Meridee Score  Netty Starring., MD;  Location: [facility];  Service: Gastroenterology;  Laterality: N/A;  . ESOPHAGOGASTRODUODENOSCOPY (EGD) WITH PROPOFOL N/A 09/05/2019   Procedure: ESOPHAGOGASTRODUODENOSCOPY (EGD) WITH PROPOFOL;  Surgeon: Meridee Score Netty Starring., MD;  Location: [facility] END[facility]COPY;  Service: Gastroenterology;  Laterality: N/A;   With pancreatic pseudocyst necrosectomy via established cyst gastrostomy  . ESOPHAGOGASTRODUODENOSCOPY (EGD) WITH PROPOFOL N/A 09/18/2019   Procedure: ESOPHAGOGASTRODUODENOSCOPY (EGD) WITH PROPOFOL;  Surgeon: Meridee Score Netty Starring., MD;  Location: [facility];  Service: Gastroenterology;  Laterality: N/A;  . EUS  08/23/2019   Procedure: UPPER ENDOSCOPIC ULTRASOUND (EUS) LINEAR;  Surgeon: Lemar Lofty., MD;  Location: [facility];  Service: Gastroenterology;;  . FOREIGN BODY REMOVAL  09/05/2019   Procedure: FOREIGN BODY REMOVAL;  Surgeon: Lemar Lofty., MD;  Location: [facility]COPY;  Service: Gastroenterology;;  . FOREIGN BODY REMOVAL  09/18/2019   Procedure: FOREIGN BODY REMOVAL;  Surgeon: Lemar Lofty., MD;  Location: [facility];  Service: Gastroenterology;;  . HOLMIUM LASER APPLICATION Bilateral 05/02/2013   Procedure: HOLMIUM LASER APPLICATION;  Surgeon: Valetta Fuller, MD;  Location: [facility];  Service: Urology;  Laterality: Bilateral;  . LAPAROSCOPIC APPENDECTOMY N/A 05/22/2014   Procedure: APPENDECTOMY LAPAROSCOPIC;  Surgeon: Glenna Fellows, MD;  Location: [facility];  Service: General;  Laterality: N/A;  . PANCREATIC STENT PLACEMENT  08/23/2019   Procedure: PANCREATIC STENT PLACEMENT;  Surgeon: Lemar Lofty., MD;  Location: [facility];  Service: Gastroenterology;;  . PANCREATIC STENT PLACEMENT  09/05/2019   Procedure: PANCREATIC STENT PLACEMENT;  Surgeon: Lemar Lofty., MD;  Location: [facility];  Service: Gastroenterology;;  . PANCREATIC STENT PLACEMENT  09/18/2019   Procedure: PANCREATIC STENT  PLACEMENT;  Surgeon: Lemar Lofty., MD;  Location: [facility];  Service: Gastroenterology;;  cyst gastrostomy double pigtail stent placement   . STENT REMOVAL  08/26/2019   Procedure: STENT REMOVAL;  Surgeon: Lemar Lofty., MD;  Location: [facility] ENDOSCOPY;  Service: Gastroenterology;;  . Francine Graven REMOVAL  09/05/2019   Procedure: STENT REMOVAL;  Surgeon: Lemar Lofty., MD;  Location: [facility]COPY;  Service: Gastroenterology;;  . Francine Graven REMOVAL  09/18/2019   Procedure: STENT REMOVAL;  Surgeon: Lemar Lofty., MD;  Location: [facility];  Service: Gastroenterology;;  . TONSILLECTOMY  age 26  . UPPER ESOPHAGEAL ENDOSCOPIC ULTRASOUND (EUS) N/A 08/21/2019   Procedure: UPPER ESOPHAGEAL ENDOSCOPIC ULTRASOUND (EUS);  Surgeon: Lemar Lofty., MD;  Location: [facility];  Service: Gastroenterology;  Laterality: N/A;  For cyst gastrostomy     reports that she has never smoked. She has never used smokeless tobacco. She reports that she does not drink alcohol or use drugs.  No Known Allergies  Family History  Family history unknown: Yes    Prior to Admission medications   Medication Sig Start Date End Date Taking? Authorizing Provider  albuterol (VENTOLIN HFA) 108 (90 Base) MCG/ACT inhaler Inhale 1-2 puffs into the lungs every 6 (six) hours as needed for wheezing or shortness of breath. 07/21/19  Yes Regalado, Belkys A, MD  amitriptyline (ELAVIL) 75 MG tablet Take 75 mg by mouth at bedtime. 08/10/19  Yes [provider]  amLODipine (NORVASC) 5 MG tablet Take 5 mg by mouth daily.   Yes [provider]  atorvastatin (LIPITOR) 40 MG tablet Take 40 mg by mouth daily.   Yes [provider]  clonazePAM (KLONOPIN) 0.5 MG tablet Take 0.5 mg by mouth daily.  05/23/19  Yes [provider]  JANUMET 50-1000 MG tablet Take 1 tablet by mouth 2 (two) times daily. 09/24/19  Yes [provider]  lamoTRIgine (LAMICTAL) 100 MG tablet Take 100  mg by mouth daily. 09/22/19  Yes [provider]  LANTUS SOLOSTAR 100 UNIT/ML Solostar Pen Inject 40 Units into the skin at bedtime. Patient taking differently: Inject 50 Units into the skin at bedtime.  10/01/19  Yes Biby, Jani Files, NP  LATUDA 60 MG TABS Take 1 tablet by mouth at bedtime. 08/10/19  Yes [provider]  losartan (COZAAR) 25 MG tablet Take 25 mg by mouth daily.   Yes [provider]  ondansetron (ZOFRAN) 8 MG tablet Take 1 tablet (8 mg total) by mouth every 8 (eight) hours as needed for nausea or vomiting. 08/19/19  Yes Regalado, Belkys A, MD  Oxycodone HCl 10 MG TABS Take 10 mg by mouth every 4 (four) hours as needed (pain).  08/12/19  Yes [provider]  pantoprazole (PROTONIX) 40 MG tablet Take 40 mg by mouth daily.    Yes [provider]  propranolol (INDERAL) 20 MG tablet Take 20 mg by mouth 2 (two) times daily.   Yes [provider]  risperiDONE (RISPERDAL) 0.5 MG tablet Take 0.5 mg by mouth 2 (two) times daily. 09/22/19  Yes [provider]  traZODone (DESYREL) 100 MG tablet Take 100 mg by mouth at bedtime.   Yes [provider]  TRULICITY 4.5 MG/0.5ML SOPN Inject 4.5 mg into the skin every Wednesday.  07/29/19  Yes [provider]    Physical Exam: Vitals:   10/16/19 2147 10/16/19 2321 10/17/19 0130 10/17/19 0217  BP: Marland Kitchen)  161/103 (!) 142/101 (!) 146/98 (!) 161/115  Pulse: (!) 103 (!) 103 (!) 111 (!) 110  Resp: 18 16 11 16   Temp:   98.4 F (36.9 C) 98.2 F (36.8 C)  TempSrc:    Oral  SpO2: 99% 97% 95% 96%  Weight:      Height:        Constitutional: NAD, calm, comfortable Vitals:   10/16/19 2147 10/16/19 2321 10/17/19 0130 10/17/19 0217  BP: (!) 161/103 (!) 142/101 (!) 146/98 (!) 161/115  Pulse: (!) 103 (!) 103 (!) 111 (!) 110  Resp: 18 16 11 16   Temp:   98.4 F (36.9 C) 98.2 F (36.8 C)  TempSrc:    Oral  SpO2: 99% 97% 95% 96%  Weight:      Height:       Eyes: PERRL, lids and  conjunctivae normal ENMT: Mucous membranes are moist. Posterior pharynx clear of any exudate or lesions.Normal dentition.  Neck: normal, supple, no masses, no thyromegaly Respiratory: clear to auscultation bilaterally, no wheezing, no crackles. Normal respiratory effort. No accessory muscle use.  Cardiovascular: Regular rate and rhythm, no murmurs / rubs / gallops. No extremity edema. 2+ pedal pulses. No carotid bruits.  Abdomen: + tenderness LUQ/epigastrium, no masses palpated. No hepatosplenomegaly. Bowel sounds positive.  Musculoskeletal: no clubbing / cyanosis. No joint deformity upper and lower extremities. Good ROM, no contractures. Normal muscle tone.  Skin: no rashes, lesions, ulcers. No induration Neurologic: CN 2-12 grossly intact. Sensation intact,Strength 5/5 in all 4.  Psychiatric: Normal judgment and insight. Alert and oriented x 3. Normal mood.    Labs on Admission: I have personally reviewed following labs and imaging studies  CBC: Recent Labs  Lab 10/16/19 2021  WBC 6.2  HGB 10.1*  HCT 33.7*  MCV 72.8*  PLT 308   Basic Metabolic Panel: Recent Labs  Lab 10/16/19 2021  NA 134*  K 3.8  CL 94*  CO2 26  GLUCOSE 334*  BUN 8  CREATININE 0.90  CALCIUM 9.1   GFR: Estimated Creatinine Clearance: 103.8 mL/min (by C-G formula based on SCr of 0.9 mg/dL). Liver Function Tests: Recent Labs  Lab 10/16/19 2021  AST 27  ALT 14  ALKPHOS 72  BILITOT 0.3  PROT 7.4  ALBUMIN 3.5   Recent Labs  Lab 10/16/19 2021  LIPASE 22   No results for input(s): AMMONIA in the last 168 hours. Coagulation Profile: No results for input(s): INR, PROTIME in the last 168 hours. Cardiac Enzymes: No results for input(s): CKTOTAL, CKMB, CKMBINDEX, TROPONINI in the last 168 hours. BNP (last 3 results) No results for input(s): PROBNP in the last 8760 hours. HbA1C: No results for input(s): HGBA1C in the last 72 hours. CBG: Recent Labs  Lab 10/16/19 2347 10/17/19 0118  GLUCAP  304* 300*   Lipid Profile: No results for input(s): CHOL, HDL, LDLCALC, TRIG, CHOLHDL, LDLDIRECT in the last 72 hours. Thyroid Function Tests: No results for input(s): TSH, T4TOTAL, FREET4, T3FREE, THYROIDAB in the last 72 hours. Anemia Panel: No results for input(s): VITAMINB12, FOLATE, FERRITIN, TIBC, IRON, RETICCTPCT in the last 72 hours. Urine analysis:    Component Value Date/Time   COLORURINE YELLOW 10/16/2019 2025   APPEARANCEUR CLEAR 10/16/2019 2025   LABSPEC 1.015 10/16/2019 2025   PHURINE 6.5 10/16/2019 2025   GLUCOSEU NEGATIVE 10/16/2019 2025   HGBUR NEGATIVE 10/16/2019 2025   BILIRUBINUR NEGATIVE 10/16/2019 2025   KETONESUR NEGATIVE 10/16/2019 2025   PROTEINUR NEGATIVE 10/16/2019 2025   UROBILINOGEN 0.2 02/12/2015 1348  NITRITE NEGATIVE 10/16/2019 2025   LEUKOCYTESUR TRACE (A) 10/16/2019 2025    Radiological Exams on Admission: CT ABDOMEN PELVIS W CONTRAST  Result Date: 10/16/2019 CLINICAL DATA:  Vomiting EXAM: CT ABDOMEN AND PELVIS WITH CONTRAST TECHNIQUE: Multidetector CT imaging of the abdomen and pelvis was performed using the standard protocol following bolus administration of intravenous contrast. CONTRAST:  OMNIPAQUE IOHEXOL 300 MG/ML  SOLN COMPARISON:  CT 09/10/2019 FINDINGS: Lower chest: Lung bases demonstrate mild atelectasis at the left base. Borderline cardiomegaly. Hepatobiliary: Contracted gallbladder without definitive calcified stone. Wall is slightly thickened but likely due to contraction. No biliary dilatation. No focal hepatic abnormality. Pancreas: Multiple calcifications consistent with chronic pancreatitis. Moderate inflammatory changes about the body and tail of pancreas consistent with acute pancreatitis. No organized fluid collections at this time. No pancreatic gas. Spleen: Enlarged measuring 16 cm Adrenals/Urinary Tract: Adrenal glands are normal. Numerous intrarenal calcifications bilaterally. No hydronephrosis. The bladder is unremarkable  Stomach/Bowel: The stomach is nonenlarged. No dilated small bowel. Mild wall thickening of the splenic flexure colon. Status post appendectomy. Vascular/Lymphatic: Mild aortic atherosclerosis without aneurysm. Patent portal vessels. The splenic vein appears occluded. Mild collateral vessels are noted. No suspicious adenopathy. Reproductive: Uterus and bilateral adnexa are unremarkable. Other: Negative for free air. Inflammatory mass anterior to tail of pancreas measuring 3.4 cm. Musculoskeletal: 11 mm anterolisthesis L4 on L5 with chronic bilateral pars defect. Degenerative changes at L4-L5. IMPRESSION: 1. Acute on chronic pancreatitis with considerable inflammatory change about the body and tail of the pancreas. No rim enhancing or well organized focal fluid collections at this time. 3.4 cm inflammatory mass anterior to the tail of pancreas could represent of phlegmon. 2. 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 Portosystemic collateral vessels are noted 3. Thickening of the splenic flexure of the colon likely reactive to inflammatory changes at the tail of the pancreas 4. Numerous renal calcifications without hydronephrosis Electronically Signed   By: Jasmine Pang M.D.   On: 10/16/2019 21:57    EKG: Independently reviewed. Sinus tachycardia , no hyperacute st t wave changes. Noted borderline QT   Assessment/Plan Acute on chronic pancreatitis with possible phlegmon  -ivfs  -pain management  - iv abx due to prior history of necrotizing pancreatitis s/p necrosectomy and now with concern for phelgmon -please all gi consult in am  - hold offending medications /pharmacy consult to review medication     Prolonged QT  -on multiple psych meds with this side-effect  -repeat ekg  -check mag, phos  - hold offending medications  -consider psych consult for further assistance on resuming medications    Anemia -monitor currently stable  -check iron studies   ?Hematemesis  -per patient  streaks blood with emesis, last episode 8pm -ppi  -monitor h/h  -gi to leave further recs   Hypertension -elevated above baseline due to pain  - pain management  -resume home medications as able   Diabetes -npo status  -place on is/fs  -fs elevated on admit  Titrate iss as needed -hold out patient medications currently   OSA  -noncompliant with cpap    depression/ bipolar schizophrenia -holding psych meds due to prolonged QT  -repeat ekg , most likely will be able to resume in am  -would consider psych consult if QT still prolonged in am    FEN: Hyponatremia  Due to poor po intake  ivfs overnight and monitor  Replete electrolytes prn     DVT prophylaxis: Heparin  Code Status: FULL Family Communication:n/a Disposition  Plan: 3-5 days  Consults called:please call gi consult in am seen by Dr Meridee Score in the past  Admission status: inpatient   Lurline Del MD [facility]  If 7PM-7AM, please contact night-coverage [email or web address] Password [facility]  10/17/2019, 2:56 AM

## 2019-10-17 NOTE — H&P (View-Only) (Signed)
Consultation  Referring Provider: Dr. Jonathon Bellows    Primary Care Physician:  Knox Royalty, MD Primary Gastroenterologist: Dr. Barron Alvine        Reason for Consultation: Acute on chronic pancreatitis             HPI:   Monique Holland is a 54 y.o. 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  Pancreatitis was presumably secondary to hypercalcemia related to hyperparathyroidism.  She had a left parathyroidectomy in February.  Upon discharge she continued with nausea was admitted to [facility] 07/17/2019 with persistent nausea as well as hematemesis.  She was seen in consultation on that admission.    07/17/2019 CT scan showed extensive large intrahepatic and peripancreatic fluid collections associated with edematous and necrotic pancreas.  Slight compression of the splenic vein without definite SVT.  Extensive intraperitoneal and retroperitoneal inflammatory changes.  These were associated with reactive thickening of both the small bowel and splenic flexure.  Symptoms improved within a few days and diet was advanced.  She was discharged home with plans of follow-up with Dr. Barron Alvine.  08/02/2019 to 08/05/19 -readmission After discharge patient returned to the emergency department twice with recurrent nausea, vomiting and epigastric pain.  Pancreatic findings stable on imaging.  Lipase and other labs were unremarkable . She returned to the ED and was admitted on 08/03/2019 due to ongoing symptoms.  We explained that all of her recent studies were unchanged.  While she did have a pancreatic fluid collection they did not seem to be pressing on her stomach.  MRCP that admission showed a 12.6 cm pseudocyst.  She was asking for cheeseburger and [place] fries.  We placed on a low-fat diet and recommended discharge home.  She was discharged 08/06/2019  with antibiotics for UTI.  She was to follow-up with Dr. Barron Alvine in the office.  08/16/2019 to 08/27/19-  readmission Readmitted with ongoing upper abdominal pain, nausea and vomiting have resolved.  She also complained of hematemesis,  dark brown diarrhea andand bright red blood per rectum.  She had reported having a normal colonoscopy at [facility] gastroenterology in [facility], we apparently requested the report.  Inpatient EGD by Dr. Marina Goodell 08/18/2019 was essentially normal . Suggestion of mild to moderate extrinsic compression in the region of the duodenal bulb from known pancreatic pseudocyst. No blood, bleeding, or mucosal lesions.  She was still not able to tolerate fluids.  Plan was for cyst gastrostomy which was attempted 08/21/2019.  Procedure not done as an arterial vessel seem to be involved with fluid collection.  CT angio for further evaluation.  There is no evidence of pseudoaneurysm or arterial/venous flow within the cyst.  On 08/23/2019 she underwent EUS by Dr. Meridee Score   A pancreatic pseudocyst was found.in the peripancreatic region with walled off necrosis. Cystgastrostomy created, double pigtails placed.  On 08/26/19 had repeat EUS with necrosectomy. Pre-existing Double pigtails placed across AXIOS.  Plan was for follow-up EUS with further necrosectomy on 09/18/2018  09/05/2019 outpatient repeat EUS with necrosectomy. A non-bleeding clean based gastric ulcer was seen. Duodenal extrinsic impression in D3.   09/10/2019 ED again with abdominal pain.  Glucose was in the five hundreds, labs otherwise normal.  Repeat CT scan showed decompression of the size of the pancreatic pseudocyst.  Wall thickening about the pseudocyst and increased inflammatory changes suggest secondary infection of the pseudocyst.  Gas  within the pseudocyst felt likely to be from the drainage catheter.  Acute on chronic pancreatitis  09/15/2019 back to the ED with uncontrolled abdominal pain. The upper abdominal pain never  got better since leaving ED a few days ago but it progressed and she started vomiting so came back to ED. CT scan not repeated. WBC normal. Hgb stable at 9.4.   09/18/2019 EGD with necrosectomy - White nummular lesions in esophageal mucosa - likely recurrent Candida. Biopsied. - Pre-existing AXIOS cystgastrostomy was in place. Double pigtail stents were not present any further. Pancreatic necrosis was noted. Necrosectomy was performed. At end of procedure improvement noted in the entire cyst cavity with little amount of necrosis still present. 7 Fr x 7 cm double pigtail stent placed to continue to allow acid to enter into the cavity and clean it. - No gross lesions in the duodenal bulb, in the first portion of the duodenum and in the second portion of the duodenum.  09/19/2019 x-ray showed previous pigtail catheters to be in the ascending colon she was started on fluconazole for esophageal candidiasis with biopsies pending.  She was discharged on Augmentin twice daily.  Is also advised to take several doses of MiraLAX when she got home to help flush out the pigtail stents are located in her ascending colon.  Repeat EGD with necrosectomy was scheduled 11/11/2019.  10/16/2019 patient represented to the ER with nausea vomiting and abdominal pain.  Explains today that she was doing fine, had a Salsberry steak and mashed tears and gravy the evening of 5/25 and a bagel the morning of 5/26 and had not eaten anything else but at 5:00 was sitting on her couch watching TV and had acute onset of nausea, went to the bathroom and vomited and then started with epigastric pain which "felt just like when I first had all this happened".  Since being in the ER and being given pain meds and antiemetics she feels completely better today.  ER course: Repeat CT abdomen pelvis showed acute on chronic pancreatitis with considerable inflammatory change about the body and tail of the pancreas.  No rim-enhancing or well-organized  focal fluid collections.  3.4 cm inflammatory mass anterior to the tail the pancreas could represent phlegmon.  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  Portosystemic collateral vessels were noted.  Thickening of the splenic flexure of the colon likely reactive to inflammatory changes at the pancreas.  Numerous renal calcifications without hydronephrosis.  Previous endoscopic evaluation:  EUS 09/05/19  - Pre-existing AXIOS cystgastrostomy stent with double pigtail stents present. Double pigtail stents removed. - Necrosectomy was performed. Double pigtail replaced through the AXIOS. - Non-bleeding gastric ulcer with a clean ulcer base (Forrest Class III). - No gross lesions in the duodenal bulb, in the first portion of the duodenum and in the second portion of the duodenum. - Duodenal extrinsic impression in D3.  EUS 08/23/19   A pancreatic pseudocyst was found.in the peripancreatic region with walled off necrosis. Cystgastrostomy created, double pigtails placed.  EGD 08/18/2019 -Normal except for suggestion of mild to moderate extrinsic compression in the region of the duodenal bulb  EGD 07/19/2019 -Candida esophagitis -Z-line mildly irregular consistent with mild inflammation but no bleeding -Moderate gastritis Past Medical History:  Diagnosis Date  . Anxiety   . Asthma   . Bipolar disorder (HCC)   . Depression   . Diabetes mellitus without complication (HCC)   . Frequency of urination   . GERD (gastroesophageal reflux disease)   .  History of asthma    last episode yrs ago  . Hypertension   . OSA (obstructive sleep apnea)    pt had study done oct 2014--  schedule for cpap titrate after kidney stone surgery  . Pancreatitis   . Pseudocyst of pancreas   . Schizophrenia (HCC)   . Ureteral calculi    BILATERAL  . Wears glasses     Past Surgical History:  Procedure Laterality Date  . BALLOON DILATION N/A 08/23/2019   Procedure: BALLOON DILATION;  Surgeon:  Meridee ScoreMansouraty, Netty StarringGabriel Jr., MD;  [facility];  Service: Gastroenterology;  Laterality: N/A;  . BIOPSY  07/18/2019   Procedure: BIOPSY;  Surgeon: Shellia Cleverlyirigliano, Vito V, DO;  [facility];  Service: Gastroenterology;;  . BIOPSY  08/23/2019   Procedure: BIOPSY;  Surgeon: Lemar LoftyMansouraty, Gabriel Jr., MD;  [facility];  Service: Gastroenterology;;  . BIOPSY  09/18/2019   Procedure: BIOPSY;  Surgeon: Lemar LoftyMansouraty, Gabriel Jr., MD;  [facility];  Service: Gastroenterology;;  . CESAREAN SECTION  1991  &  2002   w/ bilateral tubal ligation in 2002  . CYSTOSCOPY W/ URETERAL STENT PLACEMENT Right 08/10/2018   Procedure: CYSTOSCOPY WITH RETROGRADE PYELOGRAM/URETERAL STENT PLACEMENT;  Surgeon: Jerilee FieldEskridge, Matthew, MD;  [facility];  Service: Urology;  Laterality: Right;  . CYSTOSCOPY WITH RETROGRADE PYELOGRAM, URETEROSCOPY AND STENT PLACEMENT Bilateral 05/02/2013   Procedure: CYSTOSCOPY WITH RETROGRADE PYELOGRAM, URETEROSCOPY ;  Surgeon: Valetta Fulleravid S Grapey, MD;  [facility] [place];  Service: Urology;  Laterality: Bilateral;  . CYSTOSCOPY WITH RETROGRADE PYELOGRAM, URETEROSCOPY AND STENT PLACEMENT Right 08/15/2018   Procedure: CYSTOSCOPY WITH RIGHT RETROGRADE PYELOGRAM, RIGHT URETEROSCOPY WITH HOLMIUM LASER AND STENT PLACEMENT;  Surgeon: Crista ElliotBell, Eugene D III, MD;  [facility];  Service: Urology;  Laterality: Right;  . CYSTOSCOPY WITH STENT PLACEMENT Left 05/02/2013   Procedure: CYSTOSCOPY WITH STENT PLACEMENT;  Surgeon: Valetta Fulleravid S Grapey, MD;  [facility] [place];  Service: Urology;  Laterality: Left;  . ENDOROTOR N/A 08/26/2019   Procedure: [id number];  Surgeon: Mansouraty, Netty StarringGabriel Jr., MD;  [facility];  Service: Gastroenterology;  Laterality: N/A;  . ENDOROTOR  09/05/2019   Procedure: [id number];  Surgeon: Mansouraty, Netty StarringGabriel Jr., MD;  [facility];  Service: Gastroenterology;;  . ESOPHAGOGASTRODUODENOSCOPY N/A 08/26/2019    Procedure: ESOPHAGOGASTRODUODENOSCOPY (EGD) with NECROSECTOMY;  Surgeon: Lemar LoftyMansouraty, Gabriel Jr., MD;  [facility];  Service: Gastroenterology;  Laterality: N/A;  . ESOPHAGOGASTRODUODENOSCOPY (EGD) WITH PROPOFOL N/A 07/18/2019   Procedure: ESOPHAGOGASTRODUODENOSCOPY (EGD) WITH PROPOFOL;  Surgeon: Shellia Cleverlyirigliano, Vito V, DO;  [facility];  Service: Gastroenterology;  Laterality: N/A;  . ESOPHAGOGASTRODUODENOSCOPY (EGD) WITH PROPOFOL N/A 08/18/2019   Procedure: ESOPHAGOGASTRODUODENOSCOPY (EGD) WITH PROPOFOL;  Surgeon: Hilarie FredricksonPerry, John N, MD;  [facility];  Service: Endoscopy;  Laterality: N/A;  . ESOPHAGOGASTRODUODENOSCOPY (EGD) WITH PROPOFOL N/A 08/21/2019   Procedure: ESOPHAGOGASTRODUODENOSCOPY (EGD) WITH PROPOFOL;  Surgeon: Meridee ScoreMansouraty, Netty StarringGabriel Jr., MD;  [facility];  Service: Gastroenterology;  Laterality: N/A;  . ESOPHAGOGASTRODUODENOSCOPY (EGD) WITH PROPOFOL N/A 08/23/2019   Procedure: ESOPHAGOGASTRODUODENOSCOPY (EGD) WITH PROPOFOL;  Surgeon: Meridee ScoreMansouraty, Netty StarringGabriel Jr., MD;  [facility];  Service: Gastroenterology;  Laterality: N/A;  . ESOPHAGOGASTRODUODENOSCOPY (EGD) WITH PROPOFOL N/A 09/05/2019   Procedure: ESOPHAGOGASTRODUODENOSCOPY (EGD) WITH PROPOFOL;  Surgeon: Meridee ScoreMansouraty, Netty StarringGabriel Jr., MD;  [facility];  Service: Gastroenterology;  Laterality: N/A;   With pancreatic pseudocyst necrosectomy via established cyst gastrostomy  . ESOPHAGOGASTRODUODENOSCOPY (EGD) WITH PROPOFOL N/A 09/18/2019   Procedure: ESOPHAGOGASTRODUODENOSCOPY (EGD) WITH PROPOFOL;  Surgeon: Lemar LoftyMansouraty, Gabriel Jr., MD;  [facility];  Service: Gastroenterology;  Laterality: N/A;  . EUS  08/23/2019   Procedure: UPPER ENDOSCOPIC ULTRASOUND (EUS) LINEAR;  Surgeon: Lemar Lofty., MD;  [facility];  Service: Gastroenterology;;  . FOREIGN BODY REMOVAL  09/05/2019   Procedure: FOREIGN BODY REMOVAL;  Surgeon: Lemar Lofty., MD;  [facility];  Service: Gastroenterology;;  . FOREIGN BODY REMOVAL  09/18/2019   Procedure: FOREIGN BODY REMOVAL;  Surgeon: Lemar Lofty., MD;  [facility];  Service: Gastroenterology;;  . HOLMIUM LASER APPLICATION Bilateral 05/02/2013   Procedure: HOLMIUM LASER APPLICATION;  Surgeon: Valetta Fuller, MD;  Location: [facility];  Service: Urology;  Laterality: Bilateral;  . LAPAROSCOPIC APPENDECTOMY N/A 05/22/2014   Procedure: APPENDECTOMY LAPAROSCOPIC;  Surgeon: Glenna Fellows, MD;  [facility];  Service: General;  Laterality: N/A;  . PANCREATIC STENT PLACEMENT  08/23/2019   Procedure: PANCREATIC STENT PLACEMENT;  Surgeon: Lemar Lofty., MD;  [facility];  Service: Gastroenterology;;  . PANCREATIC STENT PLACEMENT  09/05/2019   Procedure: PANCREATIC STENT PLACEMENT;  Surgeon: Lemar Lofty., MD;  [facility];  Service: Gastroenterology;;  . PANCREATIC STENT PLACEMENT  09/18/2019   Procedure: PANCREATIC STENT PLACEMENT;  Surgeon: Lemar Lofty., MD;  [facility];  Service: Gastroenterology;;  cyst gastrostomy double pigtail stent placement   . STENT REMOVAL  08/26/2019   Procedure: STENT REMOVAL;  Surgeon: Lemar Lofty., MD;  [facility];  Service: Gastroenterology;;  . Francine Graven REMOVAL  09/05/2019   Procedure: STENT REMOVAL;  Surgeon: Lemar Lofty., MD;  Location: [facility];  Service: Gastroenterology;;  . Francine Graven REMOVAL  09/18/2019   Procedure: STENT REMOVAL;  Surgeon: Lemar Lofty., MD;  [facility];  Service: Gastroenterology;;  . TONSILLECTOMY  age 89  . UPPER ESOPHAGEAL ENDOSCOPIC ULTRASOUND (EUS) N/A 08/21/2019   Procedure: UPPER ESOPHAGEAL ENDOSCOPIC ULTRASOUND (EUS);  Surgeon: Lemar Lofty., MD;  [facility];  Service: Gastroenterology;  Laterality: N/A;  For cyst gastrostomy    Family History  Family history unknown: Yes      Social History   Tobacco Use  . Smoking status: Never Smoker  . Smokeless tobacco: Never Used  Substance Use Topics  . Alcohol use: No  . Drug use: No    Prior to Admission medications   Medication Sig Start Date End Date Taking? Authorizing Provider  albuterol (VENTOLIN HFA) 108 (90 Base) MCG/ACT inhaler Inhale 1-2 puffs into the lungs every 6 (six) hours as needed for wheezing or shortness of breath. 07/21/19  Yes Regalado, Belkys A, MD  amitriptyline (ELAVIL) 75 MG tablet Take 75 mg by mouth at bedtime. 08/10/19  Yes [provider]  amLODipine (NORVASC) 5 MG tablet Take 5 mg by mouth daily.   Yes [provider]  atorvastatin (LIPITOR) 40 MG tablet Take 40 mg by mouth daily.   Yes [provider]  clonazePAM (KLONOPIN) 0.5 MG tablet Take 0.5 mg by mouth daily.  05/23/19  Yes [provider]  JANUMET 50-1000 MG tablet Take 1 tablet by mouth 2 (two) times daily. 09/24/19  Yes [provider]  lamoTRIgine (LAMICTAL) 100 MG tablet Take 100 mg by mouth daily. 09/22/19  Yes [provider]  LANTUS SOLOSTAR 100 UNIT/ML Solostar Pen Inject 40 Units into the skin at bedtime. Patient taking differently: Inject 50 Units into the skin at bedtime.  10/01/19  Yes Biby, Jani Files, NP  LATUDA 60 MG TABS Take 1 tablet by  mouth at bedtime. 08/10/19  Yes [provider]  losartan (COZAAR) 25 MG tablet Take 25 mg by mouth daily.   Yes [provider]  ondansetron (ZOFRAN) 8 MG tablet Take 1 tablet (8 mg total) by mouth every 8 (eight) hours as needed for nausea or vomiting. 08/19/19  Yes Regalado, Belkys A, MD  Oxycodone HCl 10 MG TABS Take 10 mg by mouth every 4 (four) hours as needed (pain).  08/12/19  Yes [provider]  pantoprazole (PROTONIX) 40 MG tablet Take 40 mg by mouth daily.    Yes [provider]  propranolol (INDERAL) 20 MG tablet Take 20 mg by mouth 2 (two) times daily.   Yes [provider]   risperiDONE (RISPERDAL) 0.5 MG tablet Take 0.5 mg by mouth 2 (two) times daily. 09/22/19  Yes [provider]  traZODone (DESYREL) 100 MG tablet Take 100 mg by mouth at bedtime.   Yes [provider]  TRULICITY 4.5 NF/6.2ZH SOPN Inject 4.5 mg into the skin every Wednesday.  07/29/19  Yes [provider]    Current Facility-Administered Medications  Medication Dose Route Frequency Provider Last Rate Last Admin  . 0.9 %  sodium chloride infusion   Intravenous Continuous Clance Boll, MD 200 mL/hr at 10/17/19 0625 New Bag at 10/17/19 0865  . amLODipine (NORVASC) tablet 5 mg  5 mg Oral Daily Myles Rosenthal A, MD      . atorvastatin (LIPITOR) tablet 40 mg  40 mg Oral Daily Myles Rosenthal A, MD      . HYDROmorphone (DILAUDID) injection 1 mg  1 mg Intravenous Q4H PRN Toy Baker, MD   1 mg at 10/17/19 0133  . insulin aspart (novoLOG) injection 0-9 Units  0-9 Units Subcutaneous Q4H Toy Baker, MD   3 Units at 10/17/19 0415  . losartan (COZAAR) tablet 25 mg  25 mg Oral Daily Myles Rosenthal A, MD      . oxyCODONE (Oxy IR/ROXICODONE) immediate release tablet 10 mg  10 mg Oral Q4H PRN Myles Rosenthal A, MD      . pantoprazole (PROTONIX) injection 40 mg  40 mg Intravenous Q12H Myles Rosenthal A, MD      . piperacillin-tazobactam (ZOSYN) IVPB 3.375 g  3.375 g Intravenous Q8H Kc, Ramesh, MD 12.5 mL/hr at 10/17/19 0624 3.375 g at 10/17/19 0624  . propranolol (INDERAL) tablet 20 mg  20 mg Oral BID Clance Boll, MD        Allergies as of 10/16/2019  . (No Known Allergies)     Review of Systems:    Constitutional: No weight loss, fever or chills Skin: No rash Cardiovascular: No chest pain   Respiratory: No SOB  Gastrointestinal: See HPI and otherwise negative Genitourinary: No dysuria  Neurological: No headache Musculoskeletal: No new muscle or joint pain Hematologic: No bleeding  Psychiatric: No history of depression or anxiety     Physical Exam:  Vital signs in last 24 hours: Temp:  [98.2 F (36.8 C)-98.9 F (37.2 C)] 98.4 F (36.9 C) (05/27 0500) Pulse Rate:  [100-115] 100 (05/27 0500) Resp:  [11-20] 16 (05/27 0217) BP: (138-188)/(80-117) 140/80 (05/27 0500) SpO2:  [95 %-99 %] 96 % (05/27 0217) Weight:  [128.8 kg] 128.8 kg (05/26 2018) Last BM Date: 10/15/19 General:   Pleasant obese AA female appears to be in NAD, Well developed, Well nourished, alert and cooperative Head:  Normocephalic and atraumatic. Eyes:   PEERL, EOMI. No icterus. Conjunctiva pink. Ears:  Normal auditory acuity. Neck:  Supple Throat: Oral cavity and pharynx without inflammation, swelling or lesion.  Lungs: Respirations even and unlabored. Lungs clear to auscultation bilaterally.   No wheezes, crackles, or rhonchi.  Heart: Normal S1, S2. No MRG. Regular rate and rhythm. No peripheral edema, cyanosis or pallor.  Abdomen:  Soft, nondistended, mild epigastric ttp. No rebound or guarding. Normal bowel sounds. No appreciable masses or hepatomegaly. Rectal:  Not performed.  Msk:  Symmetrical without gross deformities. Peripheral pulses intact.  Extremities:  Without edema, no deformity or joint abnormality.  Neurologic:  Alert and  oriented x4;  grossly normal neurologically.  Skin:   Dry and intact without significant lesions or rashes. Psychiatric: Demonstrates good judgement and reason without abnormal affect or behaviors.   LAB RESULTS: Recent Labs    10/16/19 2021  WBC 6.2  HGB 10.1*  HCT 33.7*  PLT 308   BMET Recent Labs    10/16/19 2021 10/17/19 0526  NA 134* 137  K 3.8 3.8  CL 94* 98  CO2 26 27  GLUCOSE 334* 207*  BUN 8 8  CREATININE 0.90 0.75  CALCIUM 9.1 8.4*   LFT Recent Labs    10/16/19 2021  PROT 7.4  ALBUMIN 3.5  AST 27  ALT 14  ALKPHOS 72  BILITOT 0.3   STUDIES: CT ABDOMEN PELVIS W CONTRAST  Result Date: 10/16/2019 CLINICAL DATA:  Vomiting EXAM: CT ABDOMEN AND PELVIS WITH CONTRAST TECHNIQUE:  Multidetector CT imaging of the abdomen and pelvis was performed using the standard protocol following bolus administration of intravenous contrast. CONTRAST:  OMNIPAQUE IOHEXOL 300 MG/ML  SOLN COMPARISON:  CT 09/10/2019 FINDINGS: Lower chest: Lung bases demonstrate mild atelectasis at the left base. Borderline cardiomegaly. Hepatobiliary: Contracted gallbladder without definitive calcified stone. Wall is slightly thickened but likely due to contraction. No biliary dilatation. No focal hepatic abnormality. Pancreas: Multiple calcifications consistent with chronic pancreatitis. Moderate inflammatory changes about the body and tail of pancreas consistent with acute pancreatitis. No organized fluid collections at this time. No pancreatic gas. Spleen: Enlarged measuring 16 cm Adrenals/Urinary Tract: Adrenal glands are normal. Numerous intrarenal calcifications bilaterally. No hydronephrosis. The bladder is unremarkable Stomach/Bowel: The stomach is nonenlarged. No dilated small bowel. Mild wall thickening of the splenic flexure colon. Status post appendectomy. Vascular/Lymphatic: Mild aortic atherosclerosis without aneurysm. Patent portal vessels. The splenic vein appears occluded. Mild collateral vessels are noted. No suspicious adenopathy. Reproductive: Uterus and bilateral adnexa are unremarkable. Other: Negative for free air. Inflammatory mass anterior to tail of pancreas measuring 3.4 cm. Musculoskeletal: 11 mm anterolisthesis L4 on L5 with chronic bilateral pars defect. Degenerative changes at L4-L5. IMPRESSION: 1. Acute on chronic pancreatitis with considerable inflammatory change about the body and tail of the pancreas. No rim enhancing or well organized focal fluid collections at this time. 3.4 cm inflammatory mass anterior to the tail of pancreas could represent of phlegmon. 2. 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 Portosystemic collateral vessels are noted 3. Thickening of the  splenic flexure of the colon likely reactive to inflammatory changes at the tail of the pancreas 4. Numerous renal calcifications without hydronephrosis Electronically Signed   By: Jasmine Pang M.D.   On: 10/16/2019 21:57     Impression / Plan:   Impression: 1.  Acute on chronic pancreatitis: Well-known to our service, see HPI for extensive history, acute onset of nausea vomiting abdominal pain yesterday, now improved overnight with pain meds and antiemetics  Plan: 1.  Discussed case with Dr. Meridee Score.  He has arranged  for repeat EGD tomorrow 10/18/2019 2 PM.  Did review risks, benefits, limitations and alternatives and patient agrees to proceed. 2.  Allowed clear liquids today with n.p.o. after midnight. (nurse called at 1400 and asked if patient could have more, advanced to low-fat until midnight- nursing will back down to clears if she has pain) 3.  Continue other supportive measures including antiemetics and pain meds as well as fluids. 4.  Chronic medications will need to be discussed.  Would recommend that patient have Trulicity stopped as this can have a side effect of pancreatitis. 5.  Please await any further recommendations from Dr. Meridee Score later today.  Thank you for your kind consultation, we will continue to follow.  Violet Baldy [facility]  10/17/2019, 8:34 AM

## 2019-10-17 NOTE — Consult Note (Addendum)
   Consultation  Referring Provider: Dr. Kc    Primary Care Physician:  Jones, Enrico, MD Primary Gastroenterologist: Dr. Cirigliano        Reason for Consultation: Acute on chronic pancreatitis             HPI:   Monique Holland is a 54 y.o. 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  Pancreatitis was presumably secondary to hypercalcemia related to hyperparathyroidism.  She had a left parathyroidectomy in February.  Upon discharge she continued with nausea was admitted to [facility] 07/17/2019 with persistent nausea as well as hematemesis.  She was seen in consultation on that admission.    07/17/2019 CT scan showed extensive large intrahepatic and peripancreatic fluid collections associated with edematous and necrotic pancreas.  Slight compression of the splenic vein without definite SVT.  Extensive intraperitoneal and retroperitoneal inflammatory changes.  These were associated with reactive thickening of both the small bowel and splenic flexure.  Symptoms improved within a few days and diet was advanced.  She was discharged home with plans of follow-up with Dr. Cirigliano.  08/02/2019 to 08/05/19 -readmission After discharge patient returned to the emergency department twice with recurrent nausea, vomiting and epigastric pain.  Pancreatic findings stable on imaging.  Lipase and other labs were unremarkable . She returned to the ED and was admitted on 08/03/2019 due to ongoing symptoms.  We explained that all of her recent studies were unchanged.  While she did have a pancreatic fluid collection they did not seem to be pressing on her stomach.  MRCP that admission showed a 12.6 cm pseudocyst.  She was asking for cheeseburger and French fries.  We placed on a low-fat diet and recommended discharge home.  She was discharged 08/06/2019  with antibiotics for UTI.  She was to follow-up with Dr. Cirigliano in the office.  08/16/2019 to 08/27/19-  readmission Readmitted with ongoing upper abdominal pain, nausea and vomiting have resolved.  She also complained of hematemesis,  dark brown diarrhea andand bright red blood per rectum.  She had reported having a normal colonoscopy at [facility] in [place], we apparently requested the report.  Inpatient EGD by Dr. Perry 08/18/2019 was essentially normal . Suggestion of mild to moderate extrinsic compression in the region of the duodenal bulb from known pancreatic pseudocyst. No blood, bleeding, or mucosal lesions.  She was still not able to tolerate fluids.  Plan was for cyst gastrostomy which was attempted 08/21/2019.  Procedure not done as an arterial vessel seem to be involved with fluid collection.  CT angio for further evaluation.  There is no evidence of pseudoaneurysm or arterial/venous flow within the cyst.  On 08/23/2019 she underwent EUS by Dr. Mansouraty   A pancreatic pseudocyst was found.in the peripancreatic region with walled off necrosis. Cystgastrostomy created, double pigtails placed.  On 08/26/19 had repeat EUS with necrosectomy. Pre-existing Double pigtails placed across AXIOS.  Plan was for follow-up EUS with further necrosectomy on 09/18/2018  09/05/2019 outpatient repeat EUS with necrosectomy. A non-bleeding clean based gastric ulcer was seen. Duodenal extrinsic impression in D3.   09/10/2019 ED again with abdominal pain.  Glucose was in the five hundreds, labs otherwise normal.  Repeat CT scan showed decompression of the size of the pancreatic pseudocyst.  Wall thickening about the pseudocyst and increased inflammatory changes suggest secondary infection of the pseudocyst.  Gas   within the pseudocyst felt likely to be from the drainage catheter.  Acute on chronic pancreatitis  09/15/2019 back to the ED with uncontrolled abdominal pain. The upper abdominal pain never  got better since leaving ED a few days ago but it progressed and she started vomiting so came back to ED. CT scan not repeated. WBC normal. Hgb stable at 9.4.   09/18/2019 EGD with necrosectomy - White nummular lesions in esophageal mucosa - likely recurrent Candida. Biopsied. - Pre-existing AXIOS cystgastrostomy was in place. Double pigtail stents were not present any further. Pancreatic necrosis was noted. Necrosectomy was performed. At end of procedure improvement noted in the entire cyst cavity with little amount of necrosis still present. 7 Fr x 7 cm double pigtail stent placed to continue to allow acid to enter into the cavity and clean it. - No gross lesions in the duodenal bulb, in the first portion of the duodenum and in the second portion of the duodenum.  09/19/2019 x-ray showed previous pigtail catheters to be in the ascending colon she was started on fluconazole for esophageal candidiasis with biopsies pending.  She was discharged on Augmentin twice daily.  Is also advised to take several doses of MiraLAX when she got home to help flush out the pigtail stents are located in her ascending colon.  Repeat EGD with necrosectomy was scheduled 11/11/2019.  10/16/2019 patient represented to the ER with nausea vomiting and abdominal pain.  Explains today that she was doing fine, had a Salsberry steak and mashed tears and gravy the evening of 5/25 and a bagel the morning of 5/26 and had not eaten anything else but at 5:00 was sitting on her couch watching TV and had acute onset of nausea, went to the bathroom and vomited and then started with epigastric pain which "felt just like when I first had all this happened".  Since being in the ER and being given pain meds and antiemetics she feels completely better today.  ER course: Repeat CT abdomen pelvis showed acute on chronic pancreatitis with considerable inflammatory change about the body and tail of the pancreas.  No rim-enhancing or well-organized  focal fluid collections.  3.4 cm inflammatory mass anterior to the tail the pancreas could represent phlegmon.  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  Portosystemic collateral vessels were noted.  Thickening of the splenic flexure of the colon likely reactive to inflammatory changes at the pancreas.  Numerous renal calcifications without hydronephrosis.  Previous endoscopic evaluation:  EUS 09/05/19  - Pre-existing AXIOS cystgastrostomy stent with double pigtail stents present. Double pigtail stents removed. - Necrosectomy was performed. Double pigtail replaced through the AXIOS. - Non-bleeding gastric ulcer with a clean ulcer base (Forrest Class III). - No gross lesions in the duodenal bulb, in the first portion of the duodenum and in the second portion of the duodenum. - Duodenal extrinsic impression in D3.  EUS 08/23/19   A pancreatic pseudocyst was found.in the peripancreatic region with walled off necrosis. Cystgastrostomy created, double pigtails placed.  EGD 08/18/2019 -Normal except for suggestion of mild to moderate extrinsic compression in the region of the duodenal bulb  EGD 07/19/2019 -Candida esophagitis -Z-line mildly irregular consistent with mild inflammation but no bleeding -Moderate gastritis Past Medical History:  Diagnosis Date  . Anxiety   . Asthma   . Bipolar disorder (HCC)   . Depression   . Diabetes mellitus without complication (HCC)   . Frequency of urination   . GERD (gastroesophageal reflux disease)   .   History of asthma    last episode yrs ago  . Hypertension   . OSA (obstructive sleep apnea)    pt had study done oct 2014--  schedule for cpap titrate after kidney stone surgery  . Pancreatitis   . Pseudocyst of pancreas   . Schizophrenia (HCC)   . Ureteral calculi    BILATERAL  . Wears glasses     Past Surgical History:  Procedure Laterality Date  . BALLOON DILATION N/A 08/23/2019   Procedure: BALLOON DILATION;  Surgeon:  Mansouraty, Gabriel Jr., MD;  Location: [facility];  Service: Gastroenterology;  Laterality: N/A;  . BIOPSY  07/18/2019   Procedure: BIOPSY;  Surgeon: Cirigliano, Vito V, DO;  Location: [facility];  Service: Gastroenterology;;  . BIOPSY  08/23/2019   Procedure: BIOPSY;  Surgeon: Mansouraty, Gabriel Jr., MD;  Location: [facility];  Service: Gastroenterology;;  . BIOPSY  09/18/2019   Procedure: BIOPSY;  Surgeon: Mansouraty, Gabriel Jr., MD;  Location: [facility];  Service: Gastroenterology;;  . CESAREAN SECTION  1991  &  2002   w/ bilateral tubal ligation in 2002  . CYSTOSCOPY W/ URETERAL STENT PLACEMENT Right 08/10/2018   Procedure: CYSTOSCOPY WITH RETROGRADE PYELOGRAM/URETERAL STENT PLACEMENT;  Surgeon: Eskridge, Matthew, MD;  Location: [facility];  Service: Urology;  Laterality: Right;  . CYSTOSCOPY WITH RETROGRADE PYELOGRAM, URETEROSCOPY AND STENT PLACEMENT Bilateral 05/02/2013   Procedure: CYSTOSCOPY WITH RETROGRADE PYELOGRAM, URETEROSCOPY ;  Surgeon: David S Grapey, MD;  Location: [facility];  Service: Urology;  Laterality: Bilateral;  . CYSTOSCOPY WITH RETROGRADE PYELOGRAM, URETEROSCOPY AND STENT PLACEMENT Right 08/15/2018   Procedure: CYSTOSCOPY WITH RIGHT RETROGRADE PYELOGRAM, RIGHT URETEROSCOPY WITH HOLMIUM LASER AND STENT PLACEMENT;  Surgeon: Bell, Eugene D III, MD;  Location: [facility];  Service: Urology;  Laterality: Right;  . CYSTOSCOPY WITH STENT PLACEMENT Left 05/02/2013   Procedure: CYSTOSCOPY WITH STENT PLACEMENT;  Surgeon: David S Grapey, MD;  Location: [facility];  Service: Urology;  Laterality: Left;  . ENDOROTOR N/A 08/26/2019   Procedure: ENDOROTOR;  Surgeon: Mansouraty, Gabriel Jr., MD;  Location: [facility];  Service: Gastroenterology;  Laterality: N/A;  . ENDOROTOR  09/05/2019   Procedure: ENDOROTOR;  Surgeon: Mansouraty, Gabriel Jr., MD;  Location: [facility];  Service: Gastroenterology;;  . ESOPHAGOGASTRODUODENOSCOPY N/A 08/26/2019    Procedure: ESOPHAGOGASTRODUODENOSCOPY (EGD) with NECROSECTOMY;  Surgeon: Mansouraty, Gabriel Jr., MD;  Location: [facility];  Service: Gastroenterology;  Laterality: N/A;  . ESOPHAGOGASTRODUODENOSCOPY (EGD) WITH PROPOFOL N/A 07/18/2019   Procedure: ESOPHAGOGASTRODUODENOSCOPY (EGD) WITH PROPOFOL;  Surgeon: Cirigliano, Vito V, DO;  Location: [facility];  Service: Gastroenterology;  Laterality: N/A;  . ESOPHAGOGASTRODUODENOSCOPY (EGD) WITH PROPOFOL N/A 08/18/2019   Procedure: ESOPHAGOGASTRODUODENOSCOPY (EGD) WITH PROPOFOL;  Surgeon: Perry, John N, MD;  Location: [facility];  Service: Endoscopy;  Laterality: N/A;  . ESOPHAGOGASTRODUODENOSCOPY (EGD) WITH PROPOFOL N/A 08/21/2019   Procedure: ESOPHAGOGASTRODUODENOSCOPY (EGD) WITH PROPOFOL;  Surgeon: Mansouraty, Gabriel Jr., MD;  [facility]: [facility];  Service: Gastroenterology;  Laterality: N/A;  . ESOPHAGOGASTRODUODENOSCOPY (EGD) WITH PROPOFOL N/A 08/23/2019   Procedure: ESOPHAGOGASTRODUODENOSCOPY (EGD) WITH PROPOFOL;  Surgeon: Mansouraty, Gabriel Jr., MD;  [facility]: [facility];  Service: Gastroenterology;  Laterality: N/A;  . ESOPHAGOGASTRODUODENOSCOPY (EGD) WITH PROPOFOL N/A 09/05/2019   Procedure: ESOPHAGOGASTRODUODENOSCOPY (EGD) WITH PROPOFOL;  Surgeon: Mansouraty, Gabriel Jr., MD;  Location: [facility];  Service: Gastroenterology;  Laterality: N/A;   With pancreatic pseudocyst necrosectomy via established cyst gastrostomy  . ESOPHAGOGASTRODUODENOSCOPY (EGD) WITH PROPOFOL N/A 09/18/2019   Procedure: ESOPHAGOGASTRODUODENOSCOPY (EGD) WITH PROPOFOL;  Surgeon: Mansouraty, Gabriel Jr., MD;    [facility]: [facility];  Service: Gastroenterology;  Laterality: N/A;  . EUS  08/23/2019   Procedure: UPPER ENDOSCOPIC ULTRASOUND (EUS) LINEAR;  Surgeon: Mansouraty, Gabriel Jr., MD;  Location: [facility];  Service: Gastroenterology;;  . FOREIGN BODY REMOVAL  09/05/2019   Procedure: FOREIGN BODY REMOVAL;  Surgeon: Mansouraty, Gabriel Jr., MD;  [facility];  Service: Gastroenterology;;  . FOREIGN BODY REMOVAL  09/18/2019   Procedure: FOREIGN BODY REMOVAL;  Surgeon: Mansouraty, Gabriel Jr., MD;  Location: [facility];  Service: Gastroenterology;;  . HOLMIUM LASER APPLICATION Bilateral 05/02/2013   Procedure: HOLMIUM LASER APPLICATION;  Surgeon: David S Grapey, MD;  Location: [facility];  Service: Urology;  Laterality: Bilateral;  . LAPAROSCOPIC APPENDECTOMY N/A 05/22/2014   Procedure: APPENDECTOMY LAPAROSCOPIC;  Surgeon: Benjamin Hoxworth, MD;  Location: [facility];  Service: General;  Laterality: N/A;  . PANCREATIC STENT PLACEMENT  08/23/2019   Procedure: PANCREATIC STENT PLACEMENT;  Surgeon: Mansouraty, Gabriel Jr., MD;  Location: [facility];  Service: Gastroenterology;;  . PANCREATIC STENT PLACEMENT  09/05/2019   Procedure: PANCREATIC STENT PLACEMENT;  Surgeon: Mansouraty, Gabriel Jr., MD;  Location[facility];  Service: Gastroenterology;;  . PANCREATIC STENT PLACEMENT  09/18/2019   Procedure: PANCREATIC STENT PLACEMENT;  Surgeon: Mansouraty, Gabriel Jr., MD;  [facility];  Service: Gastroenterology;;  cyst gastrostomy double pigtail stent placement   . STENT REMOVAL  08/26/2019   Procedure: STENT REMOVAL;  Surgeon: Mansouraty, Gabriel Jr., MD;  Location: [facility];  Service: Gastroenterology;;  . STENT REMOVAL  09/05/2019   Procedure: STENT REMOVAL;  Surgeon: Mansouraty, Gabriel Jr., MD;  [facility];  Service: Gastroenterology;;  . STENT REMOVAL  09/18/2019   Procedure: STENT REMOVAL;  Surgeon: Mansouraty, Gabriel Jr., MD;  [facility]: [facility];  Service: Gastroenterology;;  . TONSILLECTOMY  age 20  . UPPER ESOPHAGEAL ENDOSCOPIC ULTRASOUND (EUS) N/A 08/21/2019   Procedure: UPPER ESOPHAGEAL ENDOSCOPIC ULTRASOUND (EUS);  Surgeon: Mansouraty, Gabriel Jr., MD;  [facility];  Service: Gastroenterology;  Laterality: N/A;  For cyst gastrostomy    Family History  Family history unknown: Yes      Social History   Tobacco Use  . Smoking status: Never Smoker  . Smokeless tobacco: Never Used  Substance Use Topics  . Alcohol use: No  . Drug use: No    Prior to Admission medications   Medication Sig Start Date End Date Taking? Authorizing Provider  albuterol (VENTOLIN HFA) 108 (90 Base) MCG/ACT inhaler Inhale 1-2 puffs into the lungs every 6 (six) hours as needed for wheezing or shortness of breath. 07/21/19  Yes Regalado, Belkys A, MD  amitriptyline (ELAVIL) 75 MG tablet Take 75 mg by mouth at bedtime. 08/10/19  Yes [provider]  amLODipine (NORVASC) 5 MG tablet Take 5 mg by mouth daily.   Yes [provider]  atorvastatin (LIPITOR) 40 MG tablet Take 40 mg by mouth daily.   Yes [provider]  clonazePAM (KLONOPIN) 0.5 MG tablet Take 0.5 mg by mouth daily.  05/23/19  Yes [provider]  JANUMET 50-1000 MG tablet Take 1 tablet by mouth 2 (two) times daily. 09/24/19  Yes [provider]  lamoTRIgine (LAMICTAL) 100 MG tablet Take 100 mg by mouth daily. 09/22/19  Yes [provider]  LANTUS SOLOSTAR 100 UNIT/ML Solostar Pen Inject 40 Units into the skin at bedtime. Patient taking differently: Inject 50 Units into the skin at bedtime.  10/01/19  Yes Biby, Sharon L, NP  LATUDA 60 MG TABS Take 1 tablet by   mouth at bedtime. 08/10/19  Yes [provider]  losartan (COZAAR) 25 MG tablet Take 25 mg by mouth daily.   Yes [provider]  ondansetron (ZOFRAN) 8 MG tablet Take 1 tablet (8 mg total) by mouth every 8 (eight) hours as needed for nausea or vomiting. 08/19/19  Yes Regalado, Belkys A, MD  Oxycodone HCl 10 MG TABS Take 10 mg by mouth every 4 (four) hours as needed (pain).  08/12/19  Yes [provider]  pantoprazole (PROTONIX) 40 MG tablet Take 40 mg by mouth daily.    Yes [provider]  propranolol (INDERAL) 20 MG tablet Take 20 mg by mouth 2 (two) times daily.   Yes [provider]   risperiDONE (RISPERDAL) 0.5 MG tablet Take 0.5 mg by mouth 2 (two) times daily. 09/22/19  Yes [provider]  traZODone (DESYREL) 100 MG tablet Take 100 mg by mouth at bedtime.   Yes [provider]  TRULICITY 4.5 MG/0.5ML SOPN Inject 4.5 mg into the skin every Wednesday.  07/29/19  Yes [provider]    Current Facility-Administered Medications  Medication Dose Route Frequency Provider Last Rate Last Admin  . 0.9 %  sodium chloride infusion   Intravenous Continuous Thomas, Sara-Maiz A, MD 200 mL/hr at 10/17/19 0625 New Bag at 10/17/19 0625  . amLODipine (NORVASC) tablet 5 mg  5 mg Oral Daily Thomas, Sara-Maiz A, MD      . atorvastatin (LIPITOR) tablet 40 mg  40 mg Oral Daily Thomas, Sara-Maiz A, MD      . HYDROmorphone (DILAUDID) injection 1 mg  1 mg Intravenous Q4H PRN Doutova, Anastassia, MD   1 mg at 10/17/19 0133  . insulin aspart (novoLOG) injection 0-9 Units  0-9 Units Subcutaneous Q4H Doutova, Anastassia, MD   3 Units at 10/17/19 0415  . losartan (COZAAR) tablet 25 mg  25 mg Oral Daily Thomas, Sara-Maiz A, MD      . oxyCODONE (Oxy IR/ROXICODONE) immediate release tablet 10 mg  10 mg Oral Q4H PRN Thomas, Sara-Maiz A, MD      . pantoprazole (PROTONIX) injection 40 mg  40 mg Intravenous Q12H Thomas, Sara-Maiz A, MD      . piperacillin-tazobactam (ZOSYN) IVPB 3.375 g  3.375 g Intravenous Q8H Kc, Ramesh, MD 12.5 mL/hr at 10/17/19 0624 3.375 g at 10/17/19 0624  . propranolol (INDERAL) tablet 20 mg  20 mg Oral BID Thomas, Sara-Maiz A, MD        Allergies as of 10/16/2019  . (No Known Allergies)     Review of Systems:    Constitutional: No weight loss, fever or chills Skin: No rash Cardiovascular: No chest pain   Respiratory: No SOB  Gastrointestinal: See HPI and otherwise negative Genitourinary: No dysuria  Neurological: No headache Musculoskeletal: No new muscle or joint pain Hematologic: No bleeding  Psychiatric: No history of depression or anxiety     Physical Exam:  Vital signs in last 24 hours: Temp:  [98.2 F (36.8 C)-98.9 F (37.2 C)] 98.4 F (36.9 C) (05/27 0500) Pulse Rate:  [100-115] 100 (05/27 0500) Resp:  [11-20] 16 (05/27 0217) BP: (138-188)/(80-117) 140/80 (05/27 0500) SpO2:  [95 %-99 %] 96 % (05/27 0217) Weight:  [128.8 kg] 128.8 kg (05/26 2018) Last BM Date: 10/15/19 General:   Pleasant obese AA female appears to be in NAD, Well developed, Well nourished, alert and cooperative Head:  Normocephalic and atraumatic. Eyes:   PEERL, EOMI. No icterus. Conjunctiva pink. Ears:  Normal auditory acuity. Neck:    Supple Throat: Oral cavity and pharynx without inflammation, swelling or lesion.  Lungs: Respirations even and unlabored. Lungs clear to auscultation bilaterally.   No wheezes, crackles, or rhonchi.  Heart: Normal S1, S2. No MRG. Regular rate and rhythm. No peripheral edema, cyanosis or pallor.  Abdomen:  Soft, nondistended, mild epigastric ttp. No rebound or guarding. Normal bowel sounds. No appreciable masses or hepatomegaly. Rectal:  Not performed.  Msk:  Symmetrical without gross deformities. Peripheral pulses intact.  Extremities:  Without edema, no deformity or joint abnormality.  Neurologic:  Alert and  oriented x4;  grossly normal neurologically.  Skin:   Dry and intact without significant lesions or rashes. Psychiatric: Demonstrates good judgement and reason without abnormal affect or behaviors.   LAB RESULTS: Recent Labs    10/16/19 2021  WBC 6.2  HGB 10.1*  HCT 33.7*  PLT 308   BMET Recent Labs    10/16/19 2021 10/17/19 0526  NA 134* 137  K 3.8 3.8  CL 94* 98  CO2 26 27  GLUCOSE 334* 207*  BUN 8 8  CREATININE 0.90 0.75  CALCIUM 9.1 8.4*   LFT Recent Labs    10/16/19 2021  PROT 7.4  ALBUMIN 3.5  AST 27  ALT 14  ALKPHOS 72  BILITOT 0.3   STUDIES: CT ABDOMEN PELVIS W CONTRAST  Result Date: 10/16/2019 CLINICAL DATA:  Vomiting EXAM: CT ABDOMEN AND PELVIS WITH CONTRAST TECHNIQUE:  Multidetector CT imaging of the abdomen and pelvis was performed using the standard protocol following bolus administration of intravenous contrast. CONTRAST:  100mL OMNIPAQUE IOHEXOL 300 MG/ML  SOLN COMPARISON:  CT 09/10/2019 FINDINGS: Lower chest: Lung bases demonstrate mild atelectasis at the left base. Borderline cardiomegaly. Hepatobiliary: Contracted gallbladder without definitive calcified stone. Wall is slightly thickened but likely due to contraction. No biliary dilatation. No focal hepatic abnormality. Pancreas: Multiple calcifications consistent with chronic pancreatitis. Moderate inflammatory changes about the body and tail of pancreas consistent with acute pancreatitis. No organized fluid collections at this time. No pancreatic gas. Spleen: Enlarged measuring 16 cm Adrenals/Urinary Tract: Adrenal glands are normal. Numerous intrarenal calcifications bilaterally. No hydronephrosis. The bladder is unremarkable Stomach/Bowel: The stomach is nonenlarged. No dilated small bowel. Mild wall thickening of the splenic flexure colon. Status post appendectomy. Vascular/Lymphatic: Mild aortic atherosclerosis without aneurysm. Patent portal vessels. The splenic vein appears occluded. Mild collateral vessels are noted. No suspicious adenopathy. Reproductive: Uterus and bilateral adnexa are unremarkable. Other: Negative for free air. Inflammatory mass anterior to tail of pancreas measuring 3.4 cm. Musculoskeletal: 11 mm anterolisthesis L4 on L5 with chronic bilateral pars defect. Degenerative changes at L4-L5. IMPRESSION: 1. Acute on chronic pancreatitis with considerable inflammatory change about the body and tail of the pancreas. No rim enhancing or well organized focal fluid collections at this time. 3.4 cm inflammatory mass anterior to the tail of pancreas could represent of phlegmon. 2. 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 Portosystemic collateral vessels are noted 3. Thickening of the  splenic flexure of the colon likely reactive to inflammatory changes at the tail of the pancreas 4. Numerous renal calcifications without hydronephrosis Electronically Signed   By: Kim  Fujinaga M.D.   On: 10/16/2019 21:57     Impression / Plan:   Impression: 1.  Acute on chronic pancreatitis: Well-known to our service, see HPI for extensive history, acute onset of nausea vomiting abdominal pain yesterday, now improved overnight with pain meds and antiemetics  Plan: 1.  Discussed case with Dr. Mansouraty.  He has arranged   for repeat EGD tomorrow 10/18/2019 2 PM.  Did review risks, benefits, limitations and alternatives and patient agrees to proceed. 2.  Allowed clear liquids today with n.p.o. after midnight. (nurse called at 1400 and asked if patient could have more, advanced to low-fat until midnight- nursing will back down to clears if she has pain) 3.  Continue other supportive measures including antiemetics and pain meds as well as fluids. 4.  Chronic medications will need to be discussed.  Would recommend that patient have Trulicity stopped as this can have a side effect of pancreatitis. 5.  Please await any further recommendations from Dr. Mansouraty later today.  Thank you for your kind consultation, we will continue to follow.  Livianna Petraglia Lynne Estle Sabella  10/17/2019, 8:34 AM   

## 2019-10-17 NOTE — Progress Notes (Signed)
Initial Nutrition Assessment  RD working remotely.   DOCUMENTATION CODES:   Morbid obesity  INTERVENTION:  - diet advancement as medically feasible.  NUTRITION DIAGNOSIS:   Inadequate oral intake related to inability to eat as evidenced by NPO status.  GOAL:   Patient will meet greater than or equal to 90% of their needs  MONITOR:   Diet advancement, Labs, Weight trends  REASON FOR ASSESSMENT:   Malnutrition Screening Tool  ASSESSMENT:   54 y.o. female with medical history of HTN, obesity, DM, sleep apnea, depression, bipolar schizophrenia, esophageal candidiasis, pancreatitis with pseudocyst and previous stent d/t necrosis. She presented to the ED with acute onset of abdominal pain and vomiting (emesis streaked with blood) similar to prior episodes of pancreatitis.  Diet advanced from NPO to CLD today at 0910 and to Heart Healthy at 1402. Plan for NPO at midnight. No intakes documented.  Patient last seen by this RD on 4/28.  Per chart review, weight on 5/26 was 284 lb and weight on 4/15 was 290 lb. This indicates 6 lb weight loss (2.1% body weight) in the past 1.5 months; not significant for time frame.   Per notes: - acute on chronic pancreatitis with possible phlegmon - likely hematemesis  - anemia - plan for EGD on 5/28   Labs reviewed; CBGs: 300, 207, 195, 158 mg/dl, Ca: 8.4 mg/dl, Phos: 5.2 mg/dl. Medications reviewed; 1 mg IV folic acid/day, sliding scale novolog.  IVF; NS @ 100 ml/hr.    NUTRITION - FOCUSED PHYSICAL EXAM:  unable to complete at this time.   Diet Order:   Diet Order            Diet NPO time specified  Diet effective midnight        Diet Heart Room service appropriate? Yes; Fluid consistency: Thin  Diet effective now              EDUCATION NEEDS:   Not appropriate for education at this time  Skin:  Skin Assessment: Reviewed RN Assessment  Last BM:  5/25  Height:   Ht Readings from Last 1 Encounters:  10/16/19 5\' 8"   (1.727 m)    Weight:   Wt Readings from Last 1 Encounters:  10/16/19 128.8 kg    Ideal Body Weight:     BMI:  Body mass index is 43.18 kg/m.  Estimated Nutritional Needs:   Kcal:  2200-2400 kcal  Protein:  110-120 grams  Fluid:  >/= 2.2 L/day     10/18/19, MS, RD, LDN, CNSC Inpatient Clinical Dietitian RD pager # available in [facility]  After hours/weekend pager # available in [facility]

## 2019-10-17 NOTE — Progress Notes (Addendum)
Patient tolerating heart healthy diet with no pain, nausea or vomiting.

## 2019-10-17 NOTE — Progress Notes (Signed)
Inpatient Diabetes Program Recommendations  AACE/ADA: New Consensus Statement on Inpatient Glycemic Control (2015)  Target Ranges:  Prepandial:   less than 140 mg/dL      Peak postprandial:   less than 180 mg/dL (1-2 hours)      Critically ill patients:  140 - 180 mg/dL   Lab Results  Component Value Date   GLUCAP 158 (H) 10/17/2019   HGBA1C 13.6 (H) 10/16/2019    Review of Glycemic Control  Diabetes history: DM2 Outpatient Diabetes medications: Lantus 50 units QHS, Janumet 50/1000 mg bid, previously on Trulicity Current orders for Inpatient glycemic control: Novolog 0-9 units Q4H  HgbA1C 13.6% - down from 14.1%  Spoke with pt about HgbA1C of 13.6%. Pt states she stopped taking Trulicity d/t pancreatitis and glucose control has worsened from 02/21. Pt states she checks blood sugars daily. For tighter control, may need rapid-acting insulin at meals. Answered questions and stressed importance of getting blood sugars under control. Pt voiced understanding.  Inpatient Diabetes Program Recommendations:     For discharge:  Consider adding Novolog 5 units tidwc for meal coverage insulin. Pt to f/u with PCP and take logbook to appt for MD review.  Follow.  Thank you. Ailene Ards, RD, LDN, CDE Inpatient Diabetes Coordinator [phone]

## 2019-10-17 NOTE — Progress Notes (Signed)
Pharmacy Consult Note  Pharmacy was consulted to evaluate medications in this 54 y/o F with a h/o pancreatitis with regards to possible offending agents. Patients with a h/o pancreatitis should not be continued on Trulicity, though patient reports continuous use. Of her other medications,  losartan is Class Ib, metformin and atorvastatin are class III, and risperidone is Class IV with class I and II agents having a higher propensity to cause acute pancreatitis. It may be prudent to stop Trulicity and reevaluate the other medications if pancreatitis recurs.   Marion Downer, Kadirawel I, et al. Drug-Induced Acute Pancreatitis: An Evidence-Based Review. Clin Gastroenterol Hepatol 2007; 5:648. Copyright  2007   Luisa Hart, PharmD, BCPS

## 2019-10-17 NOTE — Progress Notes (Signed)
Pharmacy Antibiotic Note  Monique Holland is a 54 y.o. female admitted on 10/16/2019 with pancreatitis.  Pharmacy has been consulted for Zosyn dosing.  Plan: Zosyn 3.375g IV q8h (4 hour infusion).  Height: 5\' 8"  (172.7 cm) Weight: 128.8 kg (284 lb) IBW/kg (Calculated) : 63.9  Temp (24hrs), Avg:98.5 F (36.9 C), Min:98.2 F (36.8 C), Max:98.9 F (37.2 C)  Recent Labs  Lab 10/16/19 2021  WBC 6.2  CREATININE 0.90    Estimated Creatinine Clearance: 103.8 mL/min (by C-G formula based on SCr of 0.9 mg/dL).    No Known Allergies   Thank you for allowing pharmacy to be a part of this patient's care.  2022 D 10/17/2019 6:05 AM

## 2019-10-17 NOTE — Progress Notes (Signed)
   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  Nausea and vomiting.  No fever chills or chest pain.  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 Continue current plan npo, IV hydration, empiric antibiotic, GI consulted.  Prolonged QTC-monitor closely avoid QT prolonging medication  Anemia of chronic disease-with folate deficiency folate at 5.3 anemia panel stable, B12 364. Start folate supplement.  Vomiting with some blood/? Hematemesis: hb stable at 10. GI on consult.  Continue PPI for now.  Hypertension-on amlodipine, losartan, propanolol at home.  HLD on Lipitor.  Morbid obesity w BMI 43.  Diabetes mellitus on long-term insulin/Trulicity, recent hemoglobin A1c 13.6, with uncontrolled hyperglycemia.  Holding Trulicity due to pancreatitis.  Sleep apnea-noncompliant with CPAP  Depression/ bipolar/schizophrenia-cont home meds  Esophageal candidiasis treated with diflucan in April  Stroke like symptoms status post TPA in 10/01/19-MRI no acute stroke, echo EF 45 to 50% LDL 86, A1c  14.1, CTA head and neck no LVO-not sent on aspirin due to low hemoglobin and no history of diagnosis  11 mm right thyroid nodule- monitor op follow up.

## 2019-10-18 ENCOUNTER — Inpatient Hospital Stay (HOSPITAL_COMMUNITY): Payer: Medicare Other | Admitting: Certified Registered Nurse Anesthetist

## 2019-10-18 ENCOUNTER — Encounter (HOSPITAL_COMMUNITY)
Admission: EM | Disposition: A | Discharge status: Home / Self Care | Payer: Self-pay | Source: Home / Self Care | Attending: Internal Medicine

## 2019-10-18 ENCOUNTER — Encounter (HOSPITAL_COMMUNITY): Payer: Self-pay | Admitting: Internal Medicine

## 2019-10-18 DIAGNOSIS — K920 Hematemesis: Secondary | ICD-10-CM

## 2019-10-18 DIAGNOSIS — K316 Fistula of stomach and duodenum: Secondary | ICD-10-CM

## 2019-10-18 DIAGNOSIS — K228 Other specified diseases of esophagus: Secondary | ICD-10-CM

## 2019-10-18 HISTORY — PX: ESOPHAGOGASTRODUODENOSCOPY: SHX5428

## 2019-10-18 HISTORY — PX: BIOPSY: SHX5522

## 2019-10-18 LAB — COMPREHENSIVE METABOLIC PANEL
ALT: 16 U/L (ref 0–44)
AST: 23 U/L (ref 15–41)
Albumin: 3.1 g/dL — ABNORMAL LOW (ref 3.5–5.0)
Alkaline Phosphatase: 56 U/L (ref 38–126)
Anion gap: 8 (ref 5–15)
BUN: 8 mg/dL (ref 6–20)
CO2: 26 mmol/L (ref 22–32)
Calcium: 8.6 mg/dL — ABNORMAL LOW (ref 8.9–10.3)
Chloride: 100 mmol/L (ref 98–111)
Creatinine, Ser: 0.74 mg/dL (ref 0.44–1.00)
GFR calc Af Amer: >60 mL/min (ref >60)
GFR calc non Af Amer: >60 mL/min (ref >60)
Glucose, Bld: 209 mg/dL — ABNORMAL HIGH (ref 70–99)
Potassium: 4.3 mmol/L (ref 3.5–5.1)
Sodium: 134 mmol/L — ABNORMAL LOW (ref 135–145)
Total Bilirubin: 0.3 mg/dL (ref 0.3–1.2)
Total Protein: 6.5 g/dL (ref 6.5–8.1)

## 2019-10-18 LAB — LIPASE, BLOOD: Lipase: 19 U/L (ref 11–51)

## 2019-10-18 LAB — GLUCOSE, CAPILLARY
Glucose-Capillary: 112 mg/dL — ABNORMAL HIGH (ref 70–99)
Glucose-Capillary: 136 mg/dL — ABNORMAL HIGH (ref 70–99)
Glucose-Capillary: 181 mg/dL — ABNORMAL HIGH (ref 70–99)
Glucose-Capillary: 182 mg/dL — ABNORMAL HIGH (ref 70–99)
Glucose-Capillary: 207 mg/dL — ABNORMAL HIGH (ref 70–99)
Glucose-Capillary: 232 mg/dL — ABNORMAL HIGH (ref 70–99)
Glucose-Capillary: 287 mg/dL — ABNORMAL HIGH (ref 70–99)

## 2019-10-18 LAB — CBC
HCT: 32.5 % — ABNORMAL LOW (ref 36.0–46.0)
Hemoglobin: 9.3 g/dL — ABNORMAL LOW (ref 12.0–15.0)
MCH: 21.6 pg — ABNORMAL LOW (ref 26.0–34.0)
MCHC: 28.6 g/dL — ABNORMAL LOW (ref 30.0–36.0)
MCV: 75.6 fL — ABNORMAL LOW (ref 80.0–100.0)
Platelets: 239 10*3/uL (ref 150–400)
RBC: 4.3 MIL/uL (ref 3.87–5.11)
RDW: 16.1 % — ABNORMAL HIGH (ref 11.5–15.5)
WBC: 4.5 10*3/uL (ref 4.0–10.5)
nRBC: 0 % (ref 0.0–0.2)

## 2019-10-18 SURGERY — EGD (ESOPHAGOGASTRODUODENOSCOPY)
Anesthesia: Monitor Anesthesia Care

## 2019-10-18 MED ORDER — SODIUM CHLORIDE 0.9 % IV SOLN: INTRAVENOUS | Status: DC

## 2019-10-18 MED ORDER — LABETALOL HCL 5 MG/ML IV SOLN
INTRAVENOUS | Status: AC
  Filled 2019-10-18: qty 4

## 2019-10-18 MED ORDER — PROMETHAZINE HCL 6.25 MG/5ML PO SYRP
12.5000 mg | ORAL_SOLUTION | ORAL | Status: DC | PRN
  Administered 2019-10-18: 12.5 mg via ORAL
  Filled 2019-10-18 (×2): qty 10

## 2019-10-18 MED ORDER — LABETALOL HCL 5 MG/ML IV SOLN
INTRAVENOUS | Status: DC | PRN
  Administered 2019-10-18: 2.5 mg via INTRAVENOUS

## 2019-10-18 MED ORDER — LIDOCAINE 2% (20 MG/ML) 5 ML SYRINGE
INTRAMUSCULAR | Status: DC | PRN
  Administered 2019-10-18: 40 mg via INTRAVENOUS

## 2019-10-18 MED ORDER — LABETALOL HCL 5 MG/ML IV SOLN
10.0000 mg | Freq: Once | INTRAVENOUS | Status: AC
  Administered 2019-10-18: 10 mg via INTRAVENOUS

## 2019-10-18 MED ORDER — SODIUM CHLORIDE 0.9 % IV SOLN: INTRAVENOUS | Status: DC | PRN

## 2019-10-18 MED ORDER — PROPOFOL 10 MG/ML IV BOLUS
INTRAVENOUS | Status: DC | PRN
  Administered 2019-10-18 (×2): 20 mg via INTRAVENOUS
  Administered 2019-10-18: 40 mg via INTRAVENOUS
  Administered 2019-10-18 (×2): 20 mg via INTRAVENOUS
  Administered 2019-10-18: 40 mg via INTRAVENOUS
  Administered 2019-10-18: 20 mg via INTRAVENOUS
  Administered 2019-10-18: 40 mg via INTRAVENOUS
  Administered 2019-10-18 (×2): 20 mg via INTRAVENOUS

## 2019-10-18 NOTE — Op Note (Signed)
[facility] Patient Name: Monique Holland Procedure Date: 10/18/2019 MRN: [id number] Attending MD: Justice Britain , MD Date of Birth: Mar 01, 1966 CSN: [id number] Age: 54 Admit Type: Inpatient Procedure:                Upper GI endoscopy Indications:              Coffee-ground emesis, Hematemesis, Pancreatic stent                            removal Providers:                Justice Britain, MD, Cleda Daub, RN, Glenis Smoker, CRNA, Theodora Blow, Technician Referring MD:             [facility]                           Gerrit Heck, MD Medicines:                Monitored Anesthesia Care Complications:            No immediate complications. Estimated Blood Loss:     Estimated blood loss was minimal. Procedure:                Pre-Anesthesia Assessment:                           - Prior to the procedure, a History and Physical                            was performed, and patient medications and                            allergies were reviewed. The patient's tolerance of                            previous anesthesia was also reviewed. The risks                            and benefits of the procedure and the sedation                            options and risks were discussed with the patient.                            All questions were answered, and informed consent                            was obtained. Prior Anticoagulants: The patient has                            taken no previous anticoagulant or antiplatelet                            agents. ASA Grade Assessment: III -  A patient with                            severe systemic disease. After reviewing the risks                            and benefits, the patient was deemed in                            satisfactory condition to undergo the procedure.                           After obtaining informed consent, the endoscope was                            passed  under direct vision. Throughout the                            procedure, the patient's blood pressure, pulse, and                            oxygen saturations were monitored continuously. The                            [id number]-H190 ([id number]) Olympus gastroscope was                            introduced through the mouth, and advanced to the                            second part of duodenum. The upper GI endoscopy was                            accomplished without difficulty. The patient                            tolerated the procedure. Scope In: Scope Out: Findings:      No gross lesions were noted in the proximal esophagus and in the mid       esophagus.      Localized moderate inflammation characterized by congestion (edema),       friability and granularity was found in the distal esophagus -       consistent with mild esophagitis and likely healing MW tear.      The Z-line was regular and was found 40 cm from the incisors.      The previous Cystgastrostomy was found on the posterior wall of the       gastric body - it remains patent though much smaller at this time.       Previous double pigtail stents have migrated outwards as would be       expected when the collections have decreased in size.      Patchy moderately erythematous mucosa without bleeding was found in the       gastric body and in the gastric antrum. Biopsies were taken with a cold       forceps for histology  and Helicobacter pylori testing.      No gross lesions were noted in the duodenal bulb, in the first portion       of the duodenum and in the second portion of the duodenum. Impression:               - No gross lesions in esophagus proximally.                            Esophageal mucosal changes were present, including                            congestion (edema), friability and granularity.                            Findings are suggestive of inflammation and healing                             esophagitis/MW tear.                           - Z-line regular, 40 cm from the incisors.                           - Cystgastrostomy noted.                           - Erythematous mucosa in the gastric body and                            antrum. Biopsied.                           - No gross lesions in the duodenal bulb, in the                            first portion of the duodenum and in the second                            portion of the duodenum. Moderate Sedation:      Not Applicable - Patient had care per Anesthesia. Recommendation:           - The patient will be observed post-procedure,                            until all discharge criteria are met.                           - Return patient to hospital ward for ongoing care.                           - PO PPI BID x 1 month. Then decrease to once daily.                           - Follow up pathology and treat HP if positive.                           -  ADAT.                           - Recommend transition of antibiotics to PO for                            14-day course overall. Not clear that the tail                            collection is a true phlegmon, but worthwhile to                            have on antibiotics for a period in time. Consider                            Augmentin.                           - Repeat CTAP in 3-weeks to be scheduled as                            outpatient and follow up with Dr. Marlynn Perking.                           - Pain medications to be administered by PCP in                            future.                           - If patient returns to hospital in future, will                            likely need to ask IR whether the area noted in the                            tail is accessible for potential drainage (would                            really try not to go after that but may consider                            need in future), it is not amenable to EUS drainage                             at this time.                           - Patient must stop the Liraglutide and some of the                            other medications as noted by Pharmacy should be  considered for transition or close monitoring.                           - Her recurrent bouts of pancreatitis without                            lipase elevation suggest the possiblity of this now                            being more of a chronic pancreatitis transition as                            she now has developed calcifications in the                            pancreatic parenchyma. Consideration of PRSS-1,                            SPINK, CFTR mutation analysis can be considered in                            future.                           - GI will signoff but call if questions. We will                            arrange the follow up.                           - The findings and recommendations were discussed                            with the patient.                           - The findings and recommendations were discussed                            with the referring physician. Procedure Code(s):        --- Professional ---                           [phone], Esophagogastroduodenoscopy, flexible,                            transoral; with biopsy, single or multiple Diagnosis Code(s):        --- Professional ---                           K22.8, Other specified diseases of esophagus                           K31.6, Fistula of stomach and duodenum  K31.89, Other diseases of stomach and duodenum                           K92.0, Hematemesis                           Z46.59, Encounter for fitting and adjustment of                            other gastrointestinal appliance and device CPT copyright 2019 [employer]. All rights reserved. The codes documented in this report are preliminary and upon coder review may  be  revised to meet current compliance requirements. Justice Britain, MD 10/18/2019 3:21:04 PM Number of Addenda: 0

## 2019-10-18 NOTE — Care Management Important Message (Signed)
Important Message  Patient Details IM Letter given to Lanier Clam RN Case Manager to present to the Patient Name: Monique Holland MRN: [id number] Date of Birth: [birth year]   Medicare Important Message Given:  Yes     Caren Macadam 10/18/2019, 10:13 AM

## 2019-10-18 NOTE — Progress Notes (Signed)
PROGRESS NOTE    Monique Holland  YIR:[id number] DOB: Jun 18, 1965 DOA: 10/16/2019 PCP: Knox Royalty, MD   Chef Complaints: Abdominal pain  Brief Narrative: 54 year old female with history of hypertension, hyperlipidemia, T2DM on long-term insulin/Trulicity, sleep apnea, depression/bipolar disorder/schizophrenia, esophageal candidiasis recently admitted and discharged 5/11 for strokelike symptoms, had TPA work-up with negative MRI brain CT head no LVO EF 45 to 50% LDL 86 A1c 14 1 1, not discharged on aspirin since no stroke on MRIm and recent admission for necrotizing pancreatitis on 4/29 status post EGD with necrosectomy presents with abdominal pain vomiting x3 with a little bit of blood Patient is admitted with IV fluids, IV Zosyn due to phlegmon seen in the CT scan.  Subjective:  Seen this morning feels well somewhat upset that she has to wait till 8 PM for endoscopy and has to be n.p.o.   Assessment & Plan:  Acute on chronic pancreatitis with phlegmon 3.2 cm, chronic splenic vein thrombosis on the CT scan in the setting of necrotizing pancreatitis on 4/29 where she had EGD with necrosectomy: GI on board appreciate input, continue IV fluids, IV Zosyn empirically, n.p.o.  She is for endoscopy today to look for the stent and removal if it still present.Await for further recommendation from gastroenterology post EGD. She has had recurrent admission. She is being followed closely by Dr Meridee Score-. She needs to be complaint with diet. Pt should not continue trulicity and also on other meds ( like losartan)-please refer to pharmacy note and if has recurrence will need to revisit other meds. Need GI opinion on splenic vein thrombosis.  Prolonged QT interval-monitor closely avoid QTC prolonging medicine  Anemia of chronic disease and folate deficiency started on folate replacement folic acid 5.3. B12 364  Vomiting with some blood/ ? Hematemesis-suspect Mallory-Weiss tear, follow-up EGD. Hb  downtrending at 9.3 g,suspected acute blood loss anemia.Continue PPI.Avoid heparin/lovenox/anticoagulation for now.  Essential hypertension blood pressure stable on home amlodipine, losartan and propanolol.  Type 2 diabetes mellitus on long-term insulin/Trulicity, Trulicity stopped due to pancreatitis.  Continuet ssi for now. Poorly controlled A1c at >13. Sugar stable. Recent Labs  Lab 10/17/19 1959 10/18/19 0001 10/18/19 0409 10/18/19 0718 10/18/19 1116  GLUCAP 219* 232* 207* 182* 136*   HLD:on lipitor.  Morbid obesity BMI 43 encourage patient to follow-up weight loss and healthy lifestyle.  OSA noncompliant with CPAP.  Depression/bipolar disorder/schizophrenia continue home meds.  History of esophageal candidiasis that was treated in April.  Strokelike symptoms status post TPA May/11/21 MRI and rest of the work-up were negative.  He was not sent on aspirin.  11 mm Thyroid nodule seen on the CT scan on last admission- outpatient follow-up with PCP soon  DVT prophylaxis:SCD.No heparin or Lovenox due to questionable hematemesis Code Status:Full Family Communication:Plan of care discussed with patient at bedside.  Status is: Inpatient Remains inpatient appropriate because:Ongoing diagnostic testing needed not appropriate for outpatient work up and Inpatient level of care appropriate due to severity of illness  Dispo: The patient is from: Home              Anticipated d/c is to: Home              Anticipated d/c date is: 1 days              Patient currently is not medically stable to d/c. Diet Order            Diet NPO time specified  Diet effective midnight  Nutrition Problem: Inadequate oral intake Etiology: inability to eat Signs/Symptoms: NPO status Interventions: Refer to RD note for recommendations Body mass index is 43.18 kg/m.  Consultants:GI Procedures:see note Microbiology:see note  Medications: Scheduled Meds: . amLODipine  5 mg Oral  Daily  . atorvastatin  40 mg Oral Daily  . folic acid  1 mg Intravenous Daily  . insulin aspart  0-9 Units Subcutaneous Q4H  . losartan  25 mg Oral Daily  . pantoprazole (PROTONIX) IV  40 mg Intravenous Q12H  . propranolol  20 mg Oral BID   Continuous Infusions: . piperacillin-tazobactam (ZOSYN)  IV 3.375 g (10/18/19 0531)    Antimicrobials: Anti-infectives (From admission, onward)   Start     Dose/Rate Route Frequency Ordered Stop   10/17/19 0700  piperacillin-tazobactam (ZOSYN) IVPB 3.375 g     3.375 g 12.5 mL/hr over 240 Minutes Intravenous Every 8 hours 10/17/19 0605         Objective: Vitals: Today's Vitals   10/17/19 2043 10/18/19 0504 10/18/19 0758 10/18/19 1303  BP:  (!) 133/92  (!) 163/103  Pulse:  91  91  Resp:  17  16  Temp:  98.2 F (36.8 C)  98.3 F (36.8 C)  TempSrc:  Oral  Oral  SpO2:  99%  100%  Weight:      Height:      PainSc: 0-No pain  0-No pain     Intake/Output Summary (Last 24 hours) at 10/18/2019 1326 Last data filed at 10/17/2019 1720 Gross per 24 hour  Intake 1827.39 ml  Output --  Net 1827.39 ml   Filed Weights   10/16/19 2018  Weight: 128.8 kg   Weight change:    Intake/Output from previous day: 05/27 0701 - 05/28 0700 In: 1827.4 [P.O.:120; I.V.:1707.4] Out: -  Intake/Output this shift: No intake/output data recorded.  Examination:  General exam: AAOx3, obese,NAD, weak appearing. HEENT:Oral mucosa moist, Ear/Nose WNL grossly,dentition normal. Respiratory system: bilaterally clear,no wheezing or crackles,no use of accessory muscle, non tender. Cardiovascular system: S1 & S2 +, regular, No JVD. Gastrointestinal system: Abdomen soft, mildly Tender centrally,ND, BS+. Nervous System:Alert, awake, moving extremities and grossly nonfocal Extremities: No edema, distal peripheral pulses palpable.  Skin: No rashes,no icterus. MSK: Normal muscle bulk,tone, power  Data Reviewed: I have personally reviewed following labs and imaging  studies CBC: Recent Labs  Lab 10/16/19 2021 10/18/19 0523  WBC 6.2 4.5  HGB 10.1* 9.3*  HCT 33.7* 32.5*  MCV 72.8* 75.6*  PLT 308 239   Basic Metabolic Panel: Recent Labs  Lab 10/16/19 2021 10/17/19 0526 10/18/19 0523  NA 134* 137 134*  K 3.8 3.8 4.3  CL 94* 98 100  CO2 26 27 26   GLUCOSE 334* 207* 209*  BUN 8 8 8   CREATININE 0.90 0.75 0.74  CALCIUM 9.1 8.4* 8.6*  MG  --  1.8  --   PHOS  --  5.2*  --    GFR: Estimated Creatinine Clearance: 116.7 mL/min (by C-G formula based on SCr of 0.74 mg/dL). Liver Function Tests: Recent Labs  Lab 10/16/19 2021 10/18/19 0523  AST 27 23  ALT 14 16  ALKPHOS 72 56  BILITOT 0.3 0.3  PROT 7.4 6.5  ALBUMIN 3.5 3.1*   Recent Labs  Lab 10/16/19 2021 10/18/19 0523  LIPASE 22 19   No results for input(s): AMMONIA in the last 168 hours. Coagulation Profile: No results for input(s): INR, PROTIME in the last 168 hours. Cardiac Enzymes: No results for input(s): CKTOTAL, CKMB,  CKMBINDEX, TROPONINI in the last 168 hours. BNP (last 3 results) No results for input(s): PROBNP in the last 8760 hours. HbA1C: Recent Labs    10/16/19 2021  HGBA1C 13.6*   CBG: Recent Labs  Lab 10/17/19 1959 10/18/19 0001 10/18/19 0409 10/18/19 0718 10/18/19 1116  GLUCAP 219* 232* 207* 182* 136*   Lipid Profile: Recent Labs    10/17/19 0526  CHOL 168  HDL 42  LDLCALC 93  TRIG 163*  CHOLHDL 4.0   Thyroid Function Tests: No results for input(s): TSH, T4TOTAL, FREET4, T3FREE, THYROIDAB in the last 72 hours. Anemia Panel: Recent Labs    10/17/19 0526  VITAMINB12 364  FOLATE 5.3*  FERRITIN 160  TIBC 253  IRON 48  RETICCTPCT 2.0   Sepsis Labs: No results for input(s): PROCALCITON, LATICACIDVEN in the last 168 hours.  Recent Results (from the past 240 hour(s))  SARS Coronavirus 2 by RT PCR (hospital order, performed in [facility] hospital lab) Nasopharyngeal Nasopharyngeal Swab     Status: None   Collection Time: 10/16/19 10:32  PM   Specimen: Nasopharyngeal Swab  Result Value Ref Range Status   SARS Coronavirus 2 NEGATIVE NEGATIVE Final    Comment: (NOTE) SARS-CoV-2 target nucleic acids are NOT DETECTED. The SARS-CoV-2 RNA is generally detectable in upper and lower respiratory specimens during the acute phase of infection. The lowest concentration of SARS-CoV-2 viral copies this assay can detect is 250 copies / mL. A negative result does not preclude SARS-CoV-2 infection and should not be used as the sole basis for treatment or other patient management decisions.  A negative result may occur with improper specimen collection / handling, submission of specimen other than nasopharyngeal swab, presence of viral mutation(s) within the areas targeted by this assay, and inadequate number of viral copies (<250 copies / mL). A negative result must be combined with clinical observations, patient history, and epidemiological information. Fact Sheet for Patients:   BoilerBrush.com.cy Fact Sheet for Healthcare Providers: https://pope.com/ This test is not yet approved or cleared  by the [place] FDA and has been authorized for detection and/or diagnosis of SARS-CoV-2 by FDA under an Emergency Use Authorization (EUA).  This EUA will remain in effect (meaning this test can be used) for the duration of the COVID-19 declaration under Section 564(b)(1) of the Act, 21 U.S.C. section 360bbb-3(b)(1), unless the authorization is terminated or revoked sooner. Performed at [facility], [address] [place], [place] [address]       Radiology Studies: CT ABDOMEN PELVIS W CONTRAST  Result Date: 10/16/2019 CLINICAL DATA:  Vomiting EXAM: CT ABDOMEN AND PELVIS WITH CONTRAST TECHNIQUE: Multidetector CT imaging of the abdomen and pelvis was performed using the standard protocol following bolus administration of intravenous contrast. CONTRAST:  OMNIPAQUE IOHEXOL  300 MG/ML  SOLN COMPARISON:  CT 09/10/2019 FINDINGS: Lower chest: Lung bases demonstrate mild atelectasis at the left base. Borderline cardiomegaly. Hepatobiliary: Contracted gallbladder without definitive calcified stone. Wall is slightly thickened but likely due to contraction. No biliary dilatation. No focal hepatic abnormality. Pancreas: Multiple calcifications consistent with chronic pancreatitis. Moderate inflammatory changes about the body and tail of pancreas consistent with acute pancreatitis. No organized fluid collections at this time. No pancreatic gas. Spleen: Enlarged measuring 16 cm Adrenals/Urinary Tract: Adrenal glands are normal. Numerous intrarenal calcifications bilaterally. No hydronephrosis. The bladder is unremarkable Stomach/Bowel: The stomach is nonenlarged. No dilated small bowel. Mild wall thickening of the splenic flexure colon. Status post appendectomy. Vascular/Lymphatic: Mild aortic atherosclerosis without  aneurysm. Patent portal vessels. The splenic vein appears occluded. Mild collateral vessels are noted. No suspicious adenopathy. Reproductive: Uterus and bilateral adnexa are unremarkable. Other: Negative for free air. Inflammatory mass anterior to tail of pancreas measuring 3.4 cm. Musculoskeletal: 11 mm anterolisthesis L4 on L5 with chronic bilateral pars defect. Degenerative changes at L4-L5. IMPRESSION: 1. Acute on chronic pancreatitis with considerable inflammatory change about the body and tail of the pancreas. No rim enhancing or well organized focal fluid collections at this time. 3.4 cm inflammatory mass anterior to the tail of pancreas could represent of phlegmon. 2. 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 Portosystemic collateral vessels are noted 3. Thickening of the splenic flexure of the colon likely reactive to inflammatory changes at the tail of the pancreas 4. Numerous renal calcifications without hydronephrosis Electronically Signed   By: Donavan Foil M.D.   On: 10/16/2019 21:57     LOS: 2 days   Antonieta Pert, MD [facility]  10/18/2019, 1:26 PM

## 2019-10-18 NOTE — Anesthesia Preprocedure Evaluation (Signed)
Anesthesia Evaluation  Patient identified by MRN, date of birth, ID band Patient awake    Reviewed: Allergy & Precautions, NPO status , Patient's Chart, lab work & pertinent test results, reviewed documented beta blocker date and time   Airway Mallampati: III  TM Distance: >3 FB Neck ROM: Full    Dental  (+) Dental Advisory Given, Loose, Poor Dentition,    Pulmonary asthma , sleep apnea ,  Non-compliant with cpap   Pulmonary exam normal        Cardiovascular hypertension, Pt. on medications and Pt. on home beta blockers Normal cardiovascular exam     Neuro/Psych PSYCHIATRIC DISORDERS Anxiety Depression Bipolar Disorder Schizophrenia negative neurological ROS     GI/Hepatic Neg liver ROS, GERD  ,Necrotic pancreatic pseudocyst   Endo/Other  diabetes, Type 2, Insulin DependentMorbid obesityPancreatic necrosis BMI 45 Took 1/2 normal dose of insulin last night, no insulin this AM  Renal/GU negative Renal ROS  negative genitourinary   Musculoskeletal negative musculoskeletal ROS (+)   Abdominal (+) + obese,   Peds  Hematology  (+) Blood dyscrasia, anemia ,   Anesthesia Other Findings   Reproductive/Obstetrics negative OB ROS                             Anesthesia Physical  Anesthesia Plan  ASA: III  Anesthesia Plan: MAC   Post-op Pain Management:    Induction: Intravenous  PONV Risk Score and Plan: 2 and Treatment may vary due to age or medical condition and Propofol infusion  Airway Management Planned: Natural Airway and Nasal Cannula  Additional Equipment: None  Intra-op Plan:   Post-operative Plan:   Informed Consent: I have reviewed the patients History and Physical, chart, labs and discussed the procedure including the risks, benefits and alternatives for the proposed anesthesia with the patient or authorized representative who has indicated his/her understanding and  acceptance.       Plan Discussed with:   Anesthesia Plan Comments:         Anesthesia Quick Evaluation

## 2019-10-18 NOTE — Interval H&P Note (Signed)
History and Physical Interval Note:  10/18/2019 2:49 PM  Monique Holland  has presented today for surgery, with the diagnosis of N/V/Hematemesis and evaluation for previously placed cystgastrostomy stent.  The various methods of treatment have been discussed with the patient and family. After consideration of risks, benefits and other options for treatment, the patient has consented to  Procedure(s): ESOPHAGOGASTRODUODENOSCOPY (EGD) (N/A) as a surgical intervention.  The patient's history has been reviewed, patient examined, no change in status, stable for surgery.  I have reviewed the patient's chart and labs.  Questions were answered to the patient's satisfaction.     [employer]

## 2019-10-18 NOTE — Anesthesia Procedure Notes (Signed)
Procedure Name: MAC Date/Time: 10/18/2019 2:49 PM Performed by: Cynda Familia, CRNA Pre-anesthesia Checklist: Patient identified, Emergency Drugs available, Suction available, Patient being monitored and Timeout performed Patient Re-evaluated:Patient Re-evaluated prior to induction Oxygen Delivery Method: Simple face mask Placement Confirmation: positive ETCO2 and breath sounds checked- equal and bilateral Dental Injury: Teeth and Oropharynx as per pre-operative assessment

## 2019-10-18 NOTE — Anesthesia Postprocedure Evaluation (Signed)
Anesthesia Post Note  Patient: Monique Holland  Procedure(s) Performed: ESOPHAGOGASTRODUODENOSCOPY (EGD) (N/A ) BIOPSY     Patient location during evaluation: Endoscopy Anesthesia Type: MAC Level of consciousness: awake and alert Pain management: pain level controlled Vital Signs Assessment: post-procedure vital signs reviewed and stable Respiratory status: spontaneous breathing, nonlabored ventilation and respiratory function stable Cardiovascular status: blood pressure returned to baseline and stable Postop Assessment: no apparent nausea or vomiting Anesthetic complications: no    Last Vitals:  Vitals:   10/18/19 1520 10/18/19 1530  BP: (!) 186/110 (!) 188/129  Pulse: 90 91  Resp: 18 (!) 21  Temp:    SpO2: 100% 100%    Last Pain:  Vitals:   10/18/19 1530  TempSrc:   PainSc: 0-No pain                 Lidia Collum

## 2019-10-18 NOTE — Transfer of Care (Addendum)
Immediate Anesthesia Transfer of Care Note  Patient: Monique Holland  Procedure(s) Performed: ESOPHAGOGASTRODUODENOSCOPY (EGD) (N/A ) BIOPSY  Patient Location: PACU- GI  Anesthesia Type:MAC  Level of Consciousness: awake and alert   Airway & Oxygen Therapy: Patient Spontanous Breathing and Patient connected to face mask oxygen  Post-op Assessment: Report given to RN and Post -op Vital signs reviewed and stable  Post vital signs: Reviewed and stable  Last Vitals:  Vitals Value Taken Time  BP 173/112 10/18/19 1515  Temp 36.9 C 10/18/19 1515  Pulse 90 10/18/19 1519  Resp 18 10/18/19 1519  SpO2 100 % 10/18/19 1519  Vitals shown include unvalidated device data.  Last Pain:  Vitals:   10/18/19 1515  TempSrc: Oral  PainSc: 0-No pain      Patients Stated Pain Goal: 3 (28/63/81 7711)  Complications: No apparent anesthesia complications

## 2019-10-19 LAB — COMPREHENSIVE METABOLIC PANEL
ALT: 13 U/L (ref 0–44)
AST: 18 U/L (ref 15–41)
Albumin: 3.3 g/dL — ABNORMAL LOW (ref 3.5–5.0)
Alkaline Phosphatase: 59 U/L (ref 38–126)
Anion gap: 8 (ref 5–15)
BUN: 7 mg/dL (ref 6–20)
CO2: 26 mmol/L (ref 22–32)
Calcium: 8.9 mg/dL (ref 8.9–10.3)
Chloride: 100 mmol/L (ref 98–111)
Creatinine, Ser: 0.72 mg/dL (ref 0.44–1.00)
GFR calc Af Amer: >60 mL/min (ref >60)
GFR calc non Af Amer: >60 mL/min (ref >60)
Glucose, Bld: 199 mg/dL — ABNORMAL HIGH (ref 70–99)
Potassium: 3.7 mmol/L (ref 3.5–5.1)
Sodium: 134 mmol/L — ABNORMAL LOW (ref 135–145)
Total Bilirubin: 0.3 mg/dL (ref 0.3–1.2)
Total Protein: 6.6 g/dL (ref 6.5–8.1)

## 2019-10-19 LAB — GLUCOSE, CAPILLARY
Glucose-Capillary: 189 mg/dL — ABNORMAL HIGH (ref 70–99)
Glucose-Capillary: 193 mg/dL — ABNORMAL HIGH (ref 70–99)
Glucose-Capillary: 195 mg/dL — ABNORMAL HIGH (ref 70–99)
Glucose-Capillary: 195 mg/dL — ABNORMAL HIGH (ref 70–99)

## 2019-10-19 LAB — CBC
HCT: 34 % — ABNORMAL LOW (ref 36.0–46.0)
Hemoglobin: 9.8 g/dL — ABNORMAL LOW (ref 12.0–15.0)
MCH: 21.6 pg — ABNORMAL LOW (ref 26.0–34.0)
MCHC: 28.8 g/dL — ABNORMAL LOW (ref 30.0–36.0)
MCV: 74.9 fL — ABNORMAL LOW (ref 80.0–100.0)
Platelets: 231 10*3/uL (ref 150–400)
RBC: 4.54 MIL/uL (ref 3.87–5.11)
RDW: 16.1 % — ABNORMAL HIGH (ref 11.5–15.5)
WBC: 5.3 10*3/uL (ref 4.0–10.5)
nRBC: 0 % (ref 0.0–0.2)

## 2019-10-19 MED ORDER — DOXYLAMINE SUCCINATE (SLEEP) 25 MG PO TABS
50.0000 mg | ORAL_TABLET | Freq: Every evening | ORAL | Status: DC | PRN
  Filled 2019-10-19: qty 2

## 2019-10-19 MED ORDER — AMOXICILLIN-POT CLAVULANATE 875-125 MG PO TABS
1.0000 | ORAL_TABLET | Freq: Two times a day (BID) | ORAL | 0 refills | Status: AC
Start: 2019-10-19 — End: 2019-11-02

## 2019-10-19 MED ORDER — DOXYLAMINE SUCCINATE (SLEEP) 25 MG PO TABS
25.0000 mg | ORAL_TABLET | Freq: Every evening | ORAL | Status: DC | PRN
  Administered 2019-10-19: 25 mg via ORAL
  Filled 2019-10-19 (×3): qty 1

## 2019-10-19 MED ORDER — PANTOPRAZOLE SODIUM 40 MG PO TBEC: 40.0000 mg | DELAYED_RELEASE_TABLET | Freq: Two times a day (BID) | ORAL | 0 refills | Status: DC

## 2019-10-19 MED ORDER — PROMETHAZINE-CODEINE 6.25-10 MG/5ML PO SYRP: 5.0000 mL | ORAL_SOLUTION | Freq: Four times a day (QID) | ORAL | 0 refills | Status: DC | PRN

## 2019-10-19 NOTE — Discharge Summary (Signed)
Physician Discharge Summary  Monique Holland ZOX:[id number] DOB: [birth year] DOA: 10/16/2019  PCP: Knox Royalty, MD  Admit date: 10/16/2019 Discharge date: 10/19/2019  Admitted From: home Disposition:  home  Recommendations for Outpatient Follow-up:  1. Follow up with PCP in 1-2 weeks 2. Please obtain BMP/CBC in one week 3. Please follow up on the following pending results:  Home Health: No Equipment/Devices:None   Discharge Condition: Stable CODE STATUS: Full code Diet recommendation: bland  Brief/Interim Summary: 54 year old female with history of hypertension, hyperlipidemia, T2DM on long-term insulin/Trulicity, sleep apnea, depression/bipolar disorder/schizophrenia, esophageal candidiasis recently admitted and discharged 5/11 for strokelike symptoms, had TPA work-up with negative MRI brain CT head no LVO EF 45 to 50% LDL 86 A1c 14 1 1, not discharged on aspirin since no stroke on MRIm and recent admission for necrotizing pancreatitis on 4/29 status post EGD with necrosectomy presents with abdominal pain vomiting x3 with a little bit of blood Patient is admitted with IV fluids, IV Zosyn due to phlegmon seen in the CT scan.  Discharge Diagnoses:  Active Problems:   Pancreatitis   Prolonged QT interval  Acute on chronic pancreatitis with phlegmon 3.2 cm, chronic splenic vein thrombosis on the CT scan in the setting of necrotizing pancreatitis on 4/29 where she had EGD with necrosectomy: GI on board appreciate input, continue IV fluids, IV Zosyn empirically, n.p.o.  She is for endoscopy today to look for the stent and removal if it still present.Await for further recommendation from gastroenterology post EGD. She has had recurrent admission. She is being followed closely by Dr Meridee Score-. She needs to be complaint with diet. Pt should not continue trulicity and also on other meds ( like losartan)-please refer to pharmacy note and if has recurrence will need to revisit other  meds.  Prolonged QT interval-monitor closely avoid QTC prolonging medicine  Anemia of chronic disease and folate deficiency started on folate replacement folic acid 5.3. B12 364  Vomiting with some blood/ ? Hematemesis-suspect Mallory-Weiss tear, follow-up EGD. Hb downtrending at 9.3 g, suspected acute blood loss anemia.Continue PPI.Avoid heparin/lovenox/anticoagulation for now.  Essential hypertension blood pressure stable on home amlodipine, losartan and propanolol.  Type 2 diabetes mellitus on long-term insulin/Trulicity, Trulicity stopped due to pancreatitis.  Continuet ssi for now. Poorly controlled A1c at >13. Sugar stable.    HLD:on lipitor.  Morbid obesity BMI 43 encourage patient to follow-up weight loss and healthy lifestyle.  OSA noncompliant with CPAP.  Depression/bipolar disorder/schizophrenia continue home meds.  History of esophageal candidiasis that was treated in April.  Strokelike symptoms status post TPA May/11/21 MRI and rest of the work-up were negative.  She was not sent on aspirin.  11 mm Thyroid nodule seen on the CT scan on last admission- outpatient follow-up with PCP soon  Discharge Instructions:  Discharge Instructions    Call MD for:  persistant nausea and vomiting   Complete by: As directed    Call MD for:  severe uncontrolled pain   Complete by: As directed    Call MD for:  temperature >100.4   Complete by: As directed    Diet - low sodium heart healthy   Complete by: As directed    Increase activity slowly   Complete by: As directed      Allergies as of 10/19/2019   No Known Allergies     Medication List    STOP taking these medications   Trulicity 4.5 MG/0.5ML Sopn Generic drug: Dulaglutide     TAKE these medications  albuterol 108 (90 Base) MCG/ACT inhaler Commonly known as: Ventolin HFA Inhale 1-2 puffs into the lungs every 6 (six) hours as needed for wheezing or shortness of breath.   amitriptyline 75 MG  tablet Commonly known as: ELAVIL Take 75 mg by mouth at bedtime.   amLODipine 5 MG tablet Commonly known as: NORVASC Take 5 mg by mouth daily.   amoxicillin-clavulanate 875-125 MG tablet Commonly known as: Augmentin Take 1 tablet by mouth 2 (two) times daily for 14 days.   atorvastatin 40 MG tablet Commonly known as: LIPITOR Take 40 mg by mouth daily.   clonazePAM 0.5 MG tablet Commonly known as: KLONOPIN Take 0.5 mg by mouth daily.   Janumet 50-1000 MG tablet Generic drug: sitaGLIPtin-metformin Take 1 tablet by mouth 2 (two) times daily.   lamoTRIgine 100 MG tablet Commonly known as: LAMICTAL Take 100 mg by mouth daily.   Lantus SoloStar 100 UNIT/ML Solostar Pen Generic drug: insulin glargine Inject 40 Units into the skin at bedtime. What changed: how much to take   Latuda 60 MG Tabs Generic drug: Lurasidone HCl Take 1 tablet by mouth at bedtime.   losartan 25 MG tablet Commonly known as: COZAAR Take 25 mg by mouth daily.   ondansetron 8 MG tablet Commonly known as: ZOFRAN Take 1 tablet (8 mg total) by mouth every 8 (eight) hours as needed for nausea or vomiting.   Oxycodone HCl 10 MG Tabs Take 10 mg by mouth every 4 (four) hours as needed (pain).   pantoprazole 40 MG tablet Commonly known as: PROTONIX Take 1 tablet (40 mg total) by mouth 2 (two) times daily for 28 days. What changed: when to take this   promethazine-codeine 6.25-10 MG/5ML syrup Commonly known as: PHENERGAN with CODEINE Take 5 mLs by mouth every 6 (six) hours as needed (pain and nausea).   propranolol 20 MG tablet Commonly known as: INDERAL Take 20 mg by mouth 2 (two) times daily.   risperiDONE 0.5 MG tablet Commonly known as: RISPERDAL Take 0.5 mg by mouth 2 (two) times daily.   traZODone 100 MG tablet Commonly known as: DESYREL Take 100 mg by mouth at bedtime.      Follow-up Information    Knox Royalty, MD Follow up.   Specialty: Family Medicine Contact information: [address] [place] [address] [phone]        Mansouraty, Netty Starring., MD Follow up.   Specialties: Gastroenterology, Internal Medicine Contact information: [address] [place] [address] [phone]          No Known Allergies  Consultations:  GI   Procedures/Studies: CT Code Stroke CTA Head W/WO contrast  Result Date: 09/29/2019 CLINICAL DATA:  Initial evaluation for acute right-sided weakness. EXAM: CT ANGIOGRAPHY HEAD AND NECK TECHNIQUE: Multidetector CT imaging of the head and neck was performed using the standard protocol during bolus administration of intravenous contrast. Multiplanar CT image reconstructions and MIPs were obtained to evaluate the vascular anatomy. Carotid stenosis measurements (when applicable) are obtained utilizing NASCET criteria, using the distal internal carotid diameter as the denominator. CONTRAST:  OMNIPAQUE IOHEXOL 350 MG/ML SOLN COMPARISON:  Prior head CT from earlier same day. FINDINGS: CTA NECK FINDINGS Aortic arch: Visualized aortic arch of normal caliber with normal branch pattern. No hemodynamically significant stenosis seen about the origin of the great vessels. Visualized subclavian arteries widely patent. Right carotid system: Right common carotid artery widely patent from its origin to the bifurcation without stenosis. Mild eccentric calcified plaque at the right  bifurcation without significant stenosis. Right ICA widely patent distally to the skull base without stenosis, dissection or occlusion. Left carotid system: Left common carotid artery widely patent from its origin to the bifurcation without stenosis. No significant atheromatous narrowing about the left bifurcation. Left ICA widely patent distally to the skull base without stenosis, dissection or occlusion. Vertebral arteries: Both vertebral arteries arise from the subclavian arteries. Vertebral arteries widely patent without stenosis, dissection or occlusion. Skeleton:  No acute osseous abnormality. No discrete or worrisome osseous lesions. Other neck: No other acute soft tissue abnormality within the neck. 11 mm right thyroid nodule noted (series 4, image 69). No other mass lesion or adenopathy. Upper chest: Visualized upper chest demonstrates no acute finding. Review of the MIP images confirms the above findings CTA HEAD FINDINGS Anterior circulation: Both internal carotid arteries widely patent to the termini without stenosis. A1 segments widely patent. Normal anterior communicating artery complex. Anterior cerebral arteries widely patent to their distal aspects. No M1 stenosis or occlusion. Normal MCA bifurcations. Distal MCA branches well perfused and symmetric. Posterior circulation: Both vertebral arteries widely patent to the vertebrobasilar junction without stenosis. Both picas patent. Basilar widely patent to its distal aspect. Superior cerebral arteries patent bilaterally. Both PCAs primarily supplied via the basilar well perfused to their distal aspects. Venous sinuses: Patent. Anatomic variants: None significant. Review of the MIP images confirms the above findings IMPRESSION: 1. Negative CTA for large vessel occlusion. 2. Mild for age atheromatous change about the right carotid bifurcation without stenosis. No other significant atheromatous disease about the major arterial vasculature of the head and neck. No hemodynamically significant or correctable stenosis. 3. 11 mm right thyroid nodule, of doubtful significance given size and patient age. No follow-up imaging recommended. (Ref: J Am Coll Radiol. 2015 Feb;12(2): 143-50). Results were called by telephone at the time of interpretation on 09/29/2019 at 1:05 am to provider Dr. Wilford Corner, Who verbally acknowledged these results. Electronically Signed   By: Rise Mu M.D.   On: 09/29/2019 01:40   CT Code Stroke CTA Neck W/WO contrast  Result Date: 09/29/2019 CLINICAL DATA:  Initial evaluation for acute  right-sided weakness. EXAM: CT ANGIOGRAPHY HEAD AND NECK TECHNIQUE: Multidetector CT imaging of the head and neck was performed using the standard protocol during bolus administration of intravenous contrast. Multiplanar CT image reconstructions and MIPs were obtained to evaluate the vascular anatomy. Carotid stenosis measurements (when applicable) are obtained utilizing NASCET criteria, using the distal internal carotid diameter as the denominator. CONTRAST:  OMNIPAQUE IOHEXOL 350 MG/ML SOLN COMPARISON:  Prior head CT from earlier same day. FINDINGS: CTA NECK FINDINGS Aortic arch: Visualized aortic arch of normal caliber with normal branch pattern. No hemodynamically significant stenosis seen about the origin of the great vessels. Visualized subclavian arteries widely patent. Right carotid system: Right common carotid artery widely patent from its origin to the bifurcation without stenosis. Mild eccentric calcified plaque at the right bifurcation without significant stenosis. Right ICA widely patent distally to the skull base without stenosis, dissection or occlusion. Left carotid system: Left common carotid artery widely patent from its origin to the bifurcation without stenosis. No significant atheromatous narrowing about the left bifurcation. Left ICA widely patent distally to the skull base without stenosis, dissection or occlusion. Vertebral arteries: Both vertebral arteries arise from the subclavian arteries. Vertebral arteries widely patent without stenosis, dissection or occlusion. Skeleton: No acute osseous abnormality. No discrete or worrisome osseous lesions. Other neck: No other acute soft tissue abnormality within the neck. 11  mm right thyroid nodule noted (series 4, image 69). No other mass lesion or adenopathy. Upper chest: Visualized upper chest demonstrates no acute finding. Review of the MIP images confirms the above findings CTA HEAD FINDINGS Anterior circulation: Both internal carotid  arteries widely patent to the termini without stenosis. A1 segments widely patent. Normal anterior communicating artery complex. Anterior cerebral arteries widely patent to their distal aspects. No M1 stenosis or occlusion. Normal MCA bifurcations. Distal MCA branches well perfused and symmetric. Posterior circulation: Both vertebral arteries widely patent to the vertebrobasilar junction without stenosis. Both picas patent. Basilar widely patent to its distal aspect. Superior cerebral arteries patent bilaterally. Both PCAs primarily supplied via the basilar well perfused to their distal aspects. Venous sinuses: Patent. Anatomic variants: None significant. Review of the MIP images confirms the above findings IMPRESSION: 1. Negative CTA for large vessel occlusion. 2. Mild for age atheromatous change about the right carotid bifurcation without stenosis. No other significant atheromatous disease about the major arterial vasculature of the head and neck. No hemodynamically significant or correctable stenosis. 3. 11 mm right thyroid nodule, of doubtful significance given size and patient age. No follow-up imaging recommended. (Ref: J Am Coll Radiol. 2015 Feb;12(2): 143-50). Results were called by telephone at the time of interpretation on 09/29/2019 at 1:05 am to provider Dr. Wilford CornerArora, Who verbally acknowledged these results. Electronically Signed   By: Rise MuBenjamin  McClintock M.D.   On: 09/29/2019 01:40   MR BRAIN WO CONTRAST  Result Date: 09/30/2019 CLINICAL DATA:  Follow-up examination for acute stroke. EXAM: MRI HEAD WITHOUT CONTRAST TECHNIQUE: Multiplanar, multiecho pulse sequences of the brain and surrounding structures were obtained without intravenous contrast. COMPARISON:  Prior CT and CTA from 09/29/2019. FINDINGS: Brain: Cerebral volume within normal limits for patient age. No significant cerebral white matter disease for age. No abnormal foci of restricted diffusion to suggest acute or subacute ischemia.  Gray-white matter differentiation well maintained. No encephalomalacia to suggest chronic infarction. No foci of susceptibility artifact to suggest acute or chronic intracranial hemorrhage. No mass lesion, midline shift or mass effect. No hydrocephalus. No extra-axial fluid collection. Major dural sinuses are grossly patent. Note made of a partially empty sella. Midline structures intact and normal. Vascular: Major intracranial vascular flow voids well maintained and normal in appearance. Skull and upper cervical spine: Craniocervical junction normal. Visualized upper cervical spine within normal limits. Bone marrow signal intensity normal. No scalp soft tissue abnormality. Sinuses/Orbits: Globes and orbital soft tissues within normal limits. Paranasal sinuses are clear. No mastoid effusion. Inner ear structures normal. Other: None. IMPRESSION: Normal brain MRI.  No acute intracranial abnormality identified. Electronically Signed   By: Rise MuBenjamin  McClintock M.D.   On: 09/30/2019 03:04   CT ABDOMEN PELVIS W CONTRAST  Result Date: 10/16/2019 CLINICAL DATA:  Vomiting EXAM: CT ABDOMEN AND PELVIS WITH CONTRAST TECHNIQUE: Multidetector CT imaging of the abdomen and pelvis was performed using the standard protocol following bolus administration of intravenous contrast. CONTRAST:  100mL OMNIPAQUE IOHEXOL 300 MG/ML  SOLN COMPARISON:  CT 09/10/2019 FINDINGS: Lower chest: Lung bases demonstrate mild atelectasis at the left base. Borderline cardiomegaly. Hepatobiliary: Contracted gallbladder without definitive calcified stone. Wall is slightly thickened but likely due to contraction. No biliary dilatation. No focal hepatic abnormality. Pancreas: Multiple calcifications consistent with chronic pancreatitis. Moderate inflammatory changes about the body and tail of pancreas consistent with acute pancreatitis. No organized fluid collections at this time. No pancreatic gas. Spleen: Enlarged measuring 16 cm Adrenals/Urinary Tract:  Adrenal glands are normal. Numerous intrarenal  calcifications bilaterally. No hydronephrosis. The bladder is unremarkable Stomach/Bowel: The stomach is nonenlarged. No dilated small bowel. Mild wall thickening of the splenic flexure colon. Status post appendectomy. Vascular/Lymphatic: Mild aortic atherosclerosis without aneurysm. Patent portal vessels. The splenic vein appears occluded. Mild collateral vessels are noted. No suspicious adenopathy. Reproductive: Uterus and bilateral adnexa are unremarkable. Other: Negative for free air. Inflammatory mass anterior to tail of pancreas measuring 3.4 cm. Musculoskeletal: 11 mm anterolisthesis L4 on L5 with chronic bilateral pars defect. Degenerative changes at L4-L5. IMPRESSION: 1. Acute on chronic pancreatitis with considerable inflammatory change about the body and tail of the pancreas. No rim enhancing or well organized focal fluid collections at this time. 3.4 cm inflammatory mass anterior to the tail of pancreas could represent of phlegmon. 2. 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 Portosystemic collateral vessels are noted 3. Thickening of the splenic flexure of the colon likely reactive to inflammatory changes at the tail of the pancreas 4. Numerous renal calcifications without hydronephrosis Electronically Signed   By: Jasmine Pang M.D.   On: 10/16/2019 21:57   ECHOCARDIOGRAM COMPLETE  Result Date: 09/29/2019    ECHOCARDIOGRAM REPORT   Patient Name:   [facility] Date of Exam: 09/29/2019 Medical Rec #:  [id number]       Height:       68.0 in Accession #:    [id number]      Weight:       288.1 lb Date of Birth:  09/24/1965      BSA:          2.387 m Patient Age:    53 years        BP:           137/100 mmHg Patient Gender: F               HR:           107 bpm. Exam Location:  Inpatient Procedure: 2D Echo, Cardiac Doppler and Color Doppler Indications:    CVA  History:        Patient has no prior history of Echocardiogram examinations.                  Stroke; Risk Factors:Hypertension, Diabetes, Sleep Apnea and                 Obesity. Schizophrenia.  Sonographer:    Lavenia Atlas Referring Phys: [id number] ASHISH ARORA IMPRESSIONS  1. Left ventricular ejection fraction, by estimation, is 50 to 55%. The left ventricle has low normal function. The left ventricle has no regional wall motion abnormalities. There is mild left ventricular hypertrophy. Left ventricular diastolic parameters are consistent with Grade II diastolic dysfunction (pseudonormalization).  2. Right ventricular systolic function is normal. The right ventricular size is normal.  3. The mitral valve is normal in structure. No evidence of mitral valve regurgitation. No evidence of mitral stenosis.  4. The aortic valve is normal in structure. Aortic valve regurgitation is not visualized. No aortic stenosis is present. FINDINGS  Left Ventricle: Left ventricular ejection fraction, by estimation, is 50 to 55%. The left ventricle has low normal function. The left ventricle has no regional wall motion abnormalities. The left ventricular internal cavity size was normal in size. There is mild left ventricular hypertrophy. Left ventricular diastolic parameters are consistent with Grade II diastolic dysfunction (pseudonormalization). Right Ventricle: The right ventricular size is normal. No increase in right ventricular wall thickness. Right ventricular systolic function is normal. Left  Atrium: Left atrial size was normal in size. Right Atrium: Right atrial size was normal in size. Pericardium: There is no evidence of pericardial effusion. Mitral Valve: The mitral valve is normal in structure. No evidence of mitral valve regurgitation. No evidence of mitral valve stenosis. Tricuspid Valve: The tricuspid valve is normal in structure. Tricuspid valve regurgitation is trivial. No evidence of tricuspid stenosis. Aortic Valve: The aortic valve is normal in structure. Aortic valve regurgitation is not  visualized. No aortic stenosis is present. Pulmonic Valve: The pulmonic valve was normal in structure. Pulmonic valve regurgitation is not visualized. No evidence of pulmonic stenosis. Aorta: The aortic root and ascending aorta are structurally normal, with no evidence of dilitation. IAS/Shunts: The atrial septum is grossly normal.  LEFT VENTRICLE PLAX 2D LVIDd:         5.68 cm LVIDs:         3.72 cm LV PW:         1.32 cm LV IVS:        1.31 cm LVOT diam:     2.20 cm LV SV:         57 LV SV Index:   24 LVOT Area:     3.80 cm  RIGHT VENTRICLE RV Basal diam:  2.47 cm RV S prime:     8.59 cm/s TAPSE (M-mode): 2.7 cm LEFT ATRIUM             Index       RIGHT ATRIUM           Index LA diam:        3.80 cm 1.59 cm/m  RA Area:     12.50 cm LA Vol (A2C):   54.4 ml 22.79 ml/m RA Volume:   30.80 ml  12.90 ml/m LA Vol (A4C):   50.2 ml 21.03 ml/m LA Biplane Vol: 55.8 ml 23.38 ml/m  AORTIC VALVE LVOT Vmax:   79.60 cm/s LVOT Vmean:  56.000 cm/s LVOT VTI:    0.151 m  AORTA Ao Root diam: 3.30 cm MITRAL VALVE MV Area (PHT): 12.04 cm   SHUNTS MV Decel Time: 63 msec     Systemic VTI:  0.15 m MV E velocity: 83.60 cm/s  Systemic Diam: 2.20 cm MV A velocity: 57.80 cm/s MV E/A ratio:  1.45 Kristeen Miss MD Electronically signed by Kristeen Miss MD Signature Date/Time: 09/29/2019/12:37:43 PM    Final    CT HEAD CODE STROKE WO CONTRAST  Result Date: 09/29/2019 CLINICAL DATA:  Code stroke.  Initial evaluation for acute stroke. EXAM: CT HEAD WITHOUT CONTRAST TECHNIQUE: Contiguous axial images were obtained from the base of the skull through the vertex without intravenous contrast. COMPARISON:  None. FINDINGS: Brain: Cerebral volume within normal limits for patient age. No evidence for acute intracranial hemorrhage. No findings to suggest acute large vessel territory infarct. No mass lesion, midline shift, or mass effect. Ventricles are normal in size without evidence for hydrocephalus. No extra-axial fluid collection identified.  Vascular: No hyperdense vessel identified. Skull: Scalp soft tissues demonstrate no acute abnormality. Calvarium intact. Sinuses/Orbits: Globes and orbital soft tissues within normal limits. Visualized paranasal sinuses are clear. No mastoid effusion. ASPECTS [facility] Stroke Program Early CT Score) - Ganglionic level infarction (caudate, lentiform nuclei, internal capsule, insula, M1-M3 cortex): - Supraganglionic infarction (M4-M6 cortex): Total score (0-10 with 10 being normal): IMPRESSION: 1. Negative head CT.  No acute intracranial abnormality. 2. ASPECTS is 10. Critical Value/emergent results were called by telephone at the time of interpretation on  09/29/2019 at 12:29 am to nurse practitioner Dory Peru, who verbally acknowledged these results. Electronically Signed   By: Rise Mu M.D.   On: 09/29/2019 00:29       Subjective: Feels much better. Tolerating po. Anxious to go home.  Discharge Exam: Vitals:   10/18/19 2016 10/19/19 0428  BP: 136/89 (!) 143/93  Pulse: 94 91  Resp: 16 16  Temp: 99.2 F (37.3 C) 99.2 F (37.3 C)  SpO2: 99% 100%   Vitals:   10/18/19 1530 10/18/19 1557 10/18/19 2016 10/19/19 0428  BP: (!) 188/129 (!) 164/99 136/89 (!) 143/93  Pulse: 91 90 94 91  Resp: (!) 21 18 16 16   Temp:  98.4 F (36.9 C) 99.2 F (37.3 C) 99.2 F (37.3 C)  TempSrc:  Oral Oral Oral  SpO2: 100% 99% 99% 100%  Weight:      Height:        General: Pt is alert, awake, not in acute distress Cardiovascular: RRR, S1/S2 +, no rubs, no gallops Respiratory: CTA bilaterally, no wheezing, no rhonchi Abdominal: Soft, NT, ND, bowel sounds + Extremities: no edema, no cyanosis    The results of significant diagnostics from this hospitalization (including imaging, microbiology, ancillary and laboratory) are listed below for reference.     Microbiology: Recent Results (from the past 240 hour(s))  SARS Coronavirus 2 by RT PCR (hospital order, performed in [facility] hospital  lab) Nasopharyngeal Nasopharyngeal Swab     Status: None   Collection Time: 10/16/19 10:32 PM   Specimen: Nasopharyngeal Swab  Result Value Ref Range Status   SARS Coronavirus 2 NEGATIVE NEGATIVE Final    Comment: (NOTE) SARS-CoV-2 target nucleic acids are NOT DETECTED. The SARS-CoV-2 RNA is generally detectable in upper and lower respiratory specimens during the acute phase of infection. The lowest concentration of SARS-CoV-2 viral copies this assay can detect is 250 copies / mL. A negative result does not preclude SARS-CoV-2 infection and should not be used as the sole basis for treatment or other patient management decisions.  A negative result may occur with improper specimen collection / handling, submission of specimen other than nasopharyngeal swab, presence of viral mutation(s) within the areas targeted by this assay, and inadequate number of viral copies (<250 copies / mL). A negative result must be combined with clinical observations, patient history, and epidemiological information. Fact Sheet for Patients:   10/18/19 Fact Sheet for Healthcare Providers: BoilerBrush.com.cy This test is not yet approved or cleared  by the https://pope.com/ FDA and has been authorized for detection and/or diagnosis of SARS-CoV-2 by FDA under an Emergency Use Authorization (EUA).  This EUA will remain in effect (meaning this test can be used) for the duration of the COVID-19 declaration under Section 564(b)(1) of the Act, 21 U.S.C. section 360bbb-3(b)(1), unless the authorization is terminated or revoked sooner. Performed at [facility], [address] [place], [place]      Labs: Basic Metabolic Panel: Recent Labs  Lab 10/16/19 2021 10/17/19 0526 10/18/19 0523 10/19/19 0526  NA 134* 137 134* 134*  K 3.8 3.8 4.3 3.7  CL 94* 98 100 100  CO2 26 27 26 26   GLUCOSE 334* 207* 209* 199*  BUN 8 8 8 7   CREATININE  0.90 0.75 0.74 0.72  CALCIUM 9.1 8.4* 8.6* 8.9  MG  --  1.8  --   --   PHOS  --  5.2*  --   --    Liver Function Tests: Recent  Labs  Lab 10/16/19 2021 10/18/19 0523 10/19/19 0526  AST 27 23 18   ALT 14 16 13   ALKPHOS 72 56 59  BILITOT 0.3 0.3 0.3  PROT 7.4 6.5 6.6  ALBUMIN 3.5 3.1* 3.3*   Recent Labs  Lab 10/16/19 2021 10/18/19 0523  LIPASE 22 19   CBC: Recent Labs  Lab 10/16/19 2021 10/18/19 0523 10/19/19 0526  WBC 6.2 4.5 5.3  HGB 10.1* 9.3* 9.8*  HCT 33.7* 32.5* 34.0*  MCV 72.8* 75.6* 74.9*  PLT 308 239 231   CBG: Recent Labs  Lab 10/18/19 2019 10/19/19 0001 10/19/19 0430 10/19/19 0744 10/19/19 1148  GLUCAP 287* 193* 195* 195* 189*   Hgb A1c Recent Labs    10/16/19 2021  HGBA1C 13.6*   Lipid Profile Recent Labs    10/17/19 0526  CHOL 168  HDL 42  LDLCALC 93  TRIG 163*  CHOLHDL 4.0   Anemia work up Recent Labs    10/17/19 0526  VITAMINB12 364  FOLATE 5.3*  FERRITIN 160  TIBC 253  IRON 48  RETICCTPCT 2.0   Urinalysis    Component Value Date/Time   COLORURINE YELLOW 10/16/2019 2025   APPEARANCEUR CLEAR 10/16/2019 2025   LABSPEC 1.015 10/16/2019 2025   PHURINE 6.5 10/16/2019 2025   GLUCOSEU NEGATIVE 10/16/2019 2025   HGBUR NEGATIVE 10/16/2019 2025   BILIRUBINUR NEGATIVE 10/16/2019 2025   KETONESUR NEGATIVE 10/16/2019 2025   PROTEINUR NEGATIVE 10/16/2019 2025   UROBILINOGEN 0.2 02/12/2015 1348   NITRITE NEGATIVE 10/16/2019 2025   LEUKOCYTESUR TRACE (A) 10/16/2019 2025   Microbiology Recent Results (from the past 240 hour(s))  SARS Coronavirus 2 by RT PCR (hospital order, performed in [place] hospital lab) Nasopharyngeal Nasopharyngeal Swab     Status: None   Collection Time: 10/16/19 10:32 PM   Specimen: Nasopharyngeal Swab  Result Value Ref Range Status   SARS Coronavirus 2 NEGATIVE NEGATIVE Final    Comment: (NOTE) SARS-CoV-2 target nucleic acids are NOT DETECTED. The SARS-CoV-2 RNA is generally detectable in upper  and lower respiratory specimens during the acute phase of infection. The lowest concentration of SARS-CoV-2 viral copies this assay can detect is 250 copies / mL. A negative result does not preclude SARS-CoV-2 infection and should not be used as the sole basis for treatment or other patient management decisions.  A negative result may occur with improper specimen collection / handling, submission of specimen other than nasopharyngeal swab, presence of viral mutation(s) within the areas targeted by this assay, and inadequate number of viral copies (<250 copies / mL). A negative result must be combined with clinical observations, patient history, and epidemiological information. Fact Sheet for Patients:   StrictlyIdeas.no Fact Sheet for Healthcare Providers: BankingDealers.co.za This test is not yet approved or cleared  by the [place] FDA and has been authorized for detection and/or diagnosis of SARS-CoV-2 by FDA under an Emergency Use Authorization (EUA).  This EUA will remain in effect (meaning this test can be used) for the duration of the COVID-19 declaration under Section 564(b)(1) of the Act, 21 U.S.C. section 360bbb-3(b)(1), unless the authorization is terminated or revoked sooner. Performed at [facility], [address] [place], [place] [address]      Time coordinating discharge: Over 30 minutes  SIGNED:   Donnamae Jude, MD  [facility] 10/19/2019, 1:59 PM  If 7PM-7AM, please contact night-coverage

## 2019-10-19 NOTE — Discharge Instructions (Signed)
Chronic Pancreatitis  Chronic pancreatitis is long-lasting inflammation and scarring of the pancreas. The pancreas is a gland that is located behind the stomach. It makes enzymes that help to digest food. The pancreas also releases hormones called glucagon and insulin, which help regulate blood sugar (glucose). Damage to the pancreas may affect digestion, cause pain in the upper abdomen and back, and cause diabetes. Inflammation can also irritate other organs in the abdomen near the pancreas. At first, pancreatitis may be sudden (acute). If you have several or prolonged episodes of acute pancreatitis, the condition can turn into chronic pancreatitis. What are the causes? The most common cause of this condition is alcohol abuse. Other causes include:  High (elevated) levels of triglycerides in the blood (hypertriglyceridemia).  Gallstones or other conditions that can block the tube that drains the pancreas (pancreatic duct).  Pancreatic cancer.  Cystic fibrosis.  Too much calcium in the blood (hypercalcemia), which may be caused by an overactive parathyroid gland (hyperparathyroidism).  Certain medicines.  Injury to the pancreas.  Infection.  Autoimmune pancreatitis. This is when the body's disease-fighting (immune) system attacks the pancreas.  Genes that are passed from parent to child (inherited). In some cases, the cause may not be known. What increases the risk? This condition is more likely to develop in:  Men.  People who are 35-55 years old.  People who have a family history of pancreatitis.  People who smoke tobacco.  People who drink large amounts of alcohol over a long period of time. What are the signs or symptoms? Symptoms of this condition may include:  Pain in the abdomen or upper back. Pain may get worse after eating.  Nausea and vomiting.  Fever.  Weight loss.  A change in the color and consistency of bowel movements, such as stools that are oily,  fatty, or clay-colored. How is this diagnosed? This condition is diagnosed based on your symptoms, your medical history, and a physical exam. You may have tests, such as:  Blood tests.  Stool samples.  Biopsy of the pancreas. This is the removal of a small amount of pancreas tissue to be tested in a lab.  Imaging tests, such as: ? X-rays. ? CT scan. ? MRI. ? Ultrasound. How is this treated? You may need to be treated at a hospital. Treatment may involve:  Resting the pancreas. You may need to stop eating and drinking for a few days to give your pancreas time to recover. During this time, you will be given IV fluids to keep you hydrated.  Controlling pain. You may be given pain medicines by mouth (orally) or as injections.  Improving digestion. You may be given: ? Medicines to replace your pancreatic enzymes. ? Vitamin supplements. ? A specific diet to follow. You may work with a diet and nutrition specialist (dietitian) to make an eating plan.  Surgery to: ? Clear the pancreatic ducts of any blockages, such as gallstones. ? Remove any fluid or damaged tissue from the pancreas. Other treatments may include:  Preventing diabetes. Your health care provider may recommend that you: ? Get regular screening tests for diabetes. ? Monitor your blood glucose regularly.  Lifestyle changes, such as stopping alcohol use.  Steroid medicines, if your condition is caused by your immune system attacking your body's own tissues (autoimmune disease). Follow these instructions at home: Eating and drinking      Do not drink alcohol. If you need help quitting, ask your health care provider.  Follow a diet as told   by your health care provider or dietitian, if this applies. This may include: ? Limiting how much fat you eat. ? Eating smaller meals more often. ? Avoiding caffeine.  Drink enough fluid to keep your urine pale yellow. General instructions  Take over-the-counter and  prescription medicines only as told by your health care provider. These include vitamin supplements.  Do not drive or use heavy machinery while taking prescription pain medicine.  If you are taking prescription pain medicine, take actions to prevent or treat constipation. Your health care provider may recommend that you: ? Take an over-the-counter or prescription medicine for constipation. ? Eat foods that are high in fiber such as whole grains and beans. ? Limit foods that are high in fat and processed sugars, such as fried or sweet foods.  Do not use any products that contain nicotine or tobacco, such as cigarettes and e-cigarettes. If you need help quitting, ask your health care provider.  If recommended by your health care provider, monitor your blood glucose at home.  Keep all follow-up visits as told by your health care provider. This is important. Contact a health care provider if:  You have pain that does not get better with medicine.  You have a fever.  You have sudden weight loss. Get help right away if:  Your pain suddenly gets worse.  You have sudden swelling in your abdomen.  You start to vomit often.  You vomit blood.  You have diarrhea that does not go away.  You have blood in your stool.  You become confused or you have trouble thinking clearly. Summary  Chronic pancreatitis is long-lasting inflammation and scarring of the pancreas. Damage to the pancreas may affect digestion, cause pain in the upper abdomen and back, and cause diabetes. Inflammation can also irritate other organs in the abdomen near the pancreas.  Common causes of this condition are alcohol abuse, gallstones, high (elevated) levels of triglycerides, and certain medicines.  This condition is sometimes treated at a hospital and may involve resting the pancreas, controlling pain, replacing enzymes, and avoiding alcohol. This information is not intended to replace advice given to you by your  health care provider. Make sure you discuss any questions you have with your health care provider. Document Revised: 12/13/2018 Document Reviewed: 01/06/2017 Elsevier Patient Education  2020 [employer].  

## 2019-10-22 ENCOUNTER — Other Ambulatory Visit: Payer: Self-pay

## 2019-10-22 ENCOUNTER — Encounter: Payer: Self-pay | Admitting: *Deleted

## 2019-10-22 LAB — SURGICAL PATHOLOGY

## 2019-10-24 ENCOUNTER — Ambulatory Visit: Payer: Medicare Other | Admitting: Gastroenterology

## 2019-10-25 ENCOUNTER — Telehealth: Payer: Self-pay | Admitting: Gastroenterology

## 2019-10-25 ENCOUNTER — Encounter: Payer: Self-pay | Admitting: Gastroenterology

## 2019-10-25 NOTE — Telephone Encounter (Signed)
Monique Holland is calling back to give [place] her cell phone # [phone] in cas we have questions.

## 2019-10-28 ENCOUNTER — Other Ambulatory Visit: Payer: Self-pay

## 2019-10-28 ENCOUNTER — Encounter (HOSPITAL_BASED_OUTPATIENT_CLINIC_OR_DEPARTMENT_OTHER): Payer: Self-pay

## 2019-10-28 ENCOUNTER — Emergency Department (HOSPITAL_BASED_OUTPATIENT_CLINIC_OR_DEPARTMENT_OTHER)
Admission: EM | Admit: 2019-10-28 | Discharge: 2019-10-28 | Disposition: A | Discharge status: Home / Self Care | Payer: Medicare Other | Attending: Emergency Medicine | Admitting: Emergency Medicine

## 2019-10-28 DIAGNOSIS — E119 Type 2 diabetes mellitus without complications: Secondary | ICD-10-CM | POA: Diagnosis not present

## 2019-10-28 DIAGNOSIS — Z79899 Other long term (current) drug therapy: Secondary | ICD-10-CM | POA: Diagnosis not present

## 2019-10-28 DIAGNOSIS — J45909 Unspecified asthma, uncomplicated: Secondary | ICD-10-CM | POA: Insufficient documentation

## 2019-10-28 DIAGNOSIS — I10 Essential (primary) hypertension: Secondary | ICD-10-CM | POA: Diagnosis not present

## 2019-10-28 DIAGNOSIS — R112 Nausea with vomiting, unspecified: Secondary | ICD-10-CM | POA: Insufficient documentation

## 2019-10-28 DIAGNOSIS — R1013 Epigastric pain: Secondary | ICD-10-CM | POA: Diagnosis not present

## 2019-10-28 DIAGNOSIS — Z794 Long term (current) use of insulin: Secondary | ICD-10-CM | POA: Insufficient documentation

## 2019-10-28 LAB — CBC WITH DIFFERENTIAL/PLATELET
Abs Immature Granulocytes: 0.01 10*3/uL (ref 0.00–0.07)
Basophils Absolute: 0 10*3/uL (ref 0.0–0.1)
Basophils Relative: 1 %
Eosinophils Absolute: 0.4 10*3/uL (ref 0.0–0.5)
Eosinophils Relative: 6 %
HCT: 35.4 % — ABNORMAL LOW (ref 36.0–46.0)
Hemoglobin: 10.5 g/dL — ABNORMAL LOW (ref 12.0–15.0)
Immature Granulocytes: 0 %
Lymphocytes Relative: 34 %
Lymphs Abs: 1.9 10*3/uL (ref 0.7–4.0)
MCH: 22 pg — ABNORMAL LOW (ref 26.0–34.0)
MCHC: 29.7 g/dL — ABNORMAL LOW (ref 30.0–36.0)
MCV: 74.1 fL — ABNORMAL LOW (ref 80.0–100.0)
Monocytes Absolute: 0.4 10*3/uL (ref 0.1–1.0)
Monocytes Relative: 8 %
Neutro Abs: 2.9 10*3/uL (ref 1.7–7.7)
Neutrophils Relative %: 51 %
Platelets: 213 10*3/uL (ref 150–400)
RBC: 4.78 MIL/uL (ref 3.87–5.11)
RDW: 16.8 % — ABNORMAL HIGH (ref 11.5–15.5)
WBC: 5.7 10*3/uL (ref 4.0–10.5)
nRBC: 0 % (ref 0.0–0.2)

## 2019-10-28 LAB — COMPREHENSIVE METABOLIC PANEL
ALT: 14 U/L (ref 0–44)
AST: 19 U/L (ref 15–41)
Albumin: 3.7 g/dL (ref 3.5–5.0)
Alkaline Phosphatase: 66 U/L (ref 38–126)
Anion gap: 13 (ref 5–15)
BUN: 8 mg/dL (ref 6–20)
CO2: 25 mmol/L (ref 22–32)
Calcium: 8.9 mg/dL (ref 8.9–10.3)
Chloride: 97 mmol/L — ABNORMAL LOW (ref 98–111)
Creatinine, Ser: 1.06 mg/dL — ABNORMAL HIGH (ref 0.44–1.00)
GFR calc Af Amer: >60 mL/min (ref >60)
GFR calc non Af Amer: >60 mL/min (ref >60)
Glucose, Bld: 449 mg/dL — ABNORMAL HIGH (ref 70–99)
Potassium: 3.5 mmol/L (ref 3.5–5.1)
Sodium: 135 mmol/L (ref 135–145)
Total Bilirubin: 0.2 mg/dL — ABNORMAL LOW (ref 0.3–1.2)
Total Protein: 7.2 g/dL (ref 6.5–8.1)

## 2019-10-28 LAB — LIPASE, BLOOD: Lipase: 25 U/L (ref 11–51)

## 2019-10-28 MED ORDER — MORPHINE SULFATE (PF) 4 MG/ML IV SOLN
4.0000 mg | Freq: Once | INTRAVENOUS | Status: AC
  Administered 2019-10-28: 4 mg via INTRAVENOUS
  Filled 2019-10-28: qty 1

## 2019-10-28 MED ORDER — SODIUM CHLORIDE 0.9 % IV BOLUS (SEPSIS)
1000.0000 mL | Freq: Once | INTRAVENOUS | Status: AC
  Administered 2019-10-28: 1000 mL via INTRAVENOUS

## 2019-10-28 MED ORDER — PROMETHAZINE-CODEINE 6.25-10 MG/5ML PO SYRP: 5.0000 mL | ORAL_SOLUTION | Freq: Four times a day (QID) | ORAL | 0 refills | Status: AC | PRN

## 2019-10-28 MED ORDER — ONDANSETRON HCL 4 MG/2ML IJ SOLN
4.0000 mg | Freq: Once | INTRAMUSCULAR | Status: AC
  Administered 2019-10-28: 4 mg via INTRAVENOUS
  Filled 2019-10-28: qty 2

## 2019-10-28 NOTE — Telephone Encounter (Signed)
Patient is calling to follow up on previous request states [facility] does not have script sent on 10/19/19.

## 2019-10-28 NOTE — Telephone Encounter (Signed)
Pt has been informed that no refill will be given. Dr. Meridee Score did not discuss this with the pt per Dr. Meridee Score. Pt has been advised if she is still having pain then she would need to go to ER. Dr.Mansouraty does not prescribe narcotics.

## 2019-10-28 NOTE — ED Triage Notes (Signed)
Pt c/o abd pain, n/v/d x today-states she has hx of pancreatitis and feels same-NAD-steady gait

## 2019-10-28 NOTE — Telephone Encounter (Signed)
Pt called to clarify: [facility] did receive [facility] with codeine rx and pt has finished medication.  Pt requested a refill and stated that Dr. Meridee Score had discussed with in the hospital but the rx was prescribed by Dr. Shawnie Pons.

## 2019-10-28 NOTE — Discharge Instructions (Addendum)
Thank you for allowing me to care for you today. Please return to the emergency department if you have new or worsening symptoms. Take your medications as instructed.  ° °

## 2019-10-28 NOTE — ED Provider Notes (Signed)
[facility] EMERGENCY DEPARTMENT Provider Note   CSN: [id number] Arrival date & time: 10/28/19  1844     History Chief Complaint  Patient presents with  . Abdominal Pain    Monique Holland is a 54 y.o. female.  Patient is a 54 year old female who has past medical history of bipolar disorder, diabetes, GERD, asthma, anxiety, pancreatitis presenting to the emergency department for epigastric pain which began this morning.  Reports that she has had nausea and vomiting.  Reports the pain is severe.  Has not tried anything for relief.  Reports it feels similar to pancreatitis flares up in the past        Past Medical History:  Diagnosis Date  . Anxiety   . Asthma   . Bipolar disorder (HCC)   . Depression   . Diabetes mellitus without complication (HCC)   . Frequency of urination   . GERD (gastroesophageal reflux disease)   . History of asthma    last episode yrs ago  . Hypertension   . OSA (obstructive sleep apnea)    pt had study done oct 2014--  schedule for cpap titrate after kidney stone surgery  . Pancreatitis   . Pseudocyst of pancreas   . Schizophrenia (HCC)   . Ureteral calculi    BILATERAL  . Wears glasses     Patient Active Problem List   Diagnosis Date Noted  . Prolonged QT interval 10/17/2019  . Pancreatitis 10/16/2019  . Hyperlipidemia 10/01/2019  . OSA (obstructive sleep apnea) 10/01/2019  . GERD (gastroesophageal reflux disease) 10/01/2019  . Right sided weakness 09/29/2019  . Stroke-like episode s/p tPA 09/29/2019  . History of pancreatitis   . Intractable abdominal pain 09/15/2019  . Hyperosmolar hyperglycemic state (HHS) (HCC) 09/15/2019  . Non-intractable vomiting   . Hematemesis with nausea   . Abdominal pain 08/03/2019  . Essential hypertension 08/03/2019  . Pancreatic pseudocyst   . Nausea and vomiting in adult   . Necrotizing pancreatitis   . Gastritis and gastroduodenitis   . Candida esophagitis (HCC)   . Acute pancreatitis  07/17/2019  . Schizophrenia (HCC) 09/02/2018  . Obesity, Class III, BMI 40-49.9 (morbid obesity) (HCC) 09/02/2018  . Iron deficiency 09/02/2018  . Symptomatic anemia 09/01/2018  . Hypokalemia 09/01/2018  . Severe sepsis (HCC) 08/11/2018  . AKI (acute kidney injury) (HCC) 08/11/2018  . Hypotension 08/11/2018  . Type 2 diabetes mellitus (HCC) 08/11/2018  . Acute appendicitis 05/22/2014  . Ureteral calculus, right 05/02/2013    Past Surgical History:  Procedure Laterality Date  . BALLOON DILATION N/A 08/23/2019   Procedure: BALLOON DILATION;  Surgeon: Meridee Score Netty Starring., MD;  Location: [facility];  Service: Gastroenterology;  Laterality: N/A;  . BIOPSY  07/18/2019   Procedure: BIOPSY;  Surgeon: Shellia Cleverly, DO;  Location: [facility];  Service: Gastroenterology;;  . BIOPSY  08/23/2019   Procedure: BIOPSY;  Surgeon: Lemar Lofty., MD;  Location: [facility];  Service: Gastroenterology;;  . BIOPSY  09/18/2019   Procedure: BIOPSY;  Surgeon: Lemar Lofty., MD;  Location: [facility];  Service: Gastroenterology;;  . BIOPSY  10/18/2019   Procedure: BIOPSY;  Surgeon: Lemar Lofty., MD;  Location: [facility];  Service: Gastroenterology;;  . CESAREAN SECTION  1991  &  2002   w/ bilateral tubal ligation in 2002  . CYSTOSCOPY W/ URETERAL STENT PLACEMENT Right 08/10/2018   Procedure: CYSTOSCOPY WITH RETROGRADE PYELOGRAM/URETERAL STENT PLACEMENT;  Surgeon: Jerilee Field, MD;  Location: [facility];  Service: Urology;  Laterality: Right;  . CYSTOSCOPY WITH RETROGRADE PYELOGRAM, URETEROSCOPY AND STENT PLACEMENT Bilateral 05/02/2013   Procedure: CYSTOSCOPY WITH RETROGRADE PYELOGRAM, URETEROSCOPY ;  Surgeon: Bernestine Amass, MD;  Location: [facility];  Service: Urology;  Laterality: Bilateral;  . CYSTOSCOPY WITH RETROGRADE PYELOGRAM, URETEROSCOPY AND STENT PLACEMENT Right 08/15/2018   Procedure: CYSTOSCOPY WITH RIGHT RETROGRADE PYELOGRAM, RIGHT  URETEROSCOPY WITH HOLMIUM LASER AND STENT PLACEMENT;  Surgeon: Lucas Mallow, MD;  Location: [facility];  Service: Urology;  Laterality: Right;  . CYSTOSCOPY WITH STENT PLACEMENT Left 05/02/2013   Procedure: CYSTOSCOPY WITH STENT PLACEMENT;  Surgeon: Bernestine Amass, MD;  Location: [facility];  Service: Urology;  Laterality: Left;  . ENDOROTOR N/A 08/26/2019   Procedure: [id number];  Surgeon: Mansouraty, Telford Nab., MD;  Location: [place];  Service: Gastroenterology;  Laterality: N/A;  . ENDOROTOR  09/05/2019   Procedure: [id number];  Surgeon: Mansouraty, Telford Nab., MD;  Location: [place];  Service: Gastroenterology;;  . ESOPHAGOGASTRODUODENOSCOPY N/A 08/26/2019   Procedure: ESOPHAGOGASTRODUODENOSCOPY (EGD) with NECROSECTOMY;  Surgeon: Irving Copas., MD;  Location: [place];  Service: Gastroenterology;  Laterality: N/A;  . ESOPHAGOGASTRODUODENOSCOPY N/A 10/18/2019   Procedure: ESOPHAGOGASTRODUODENOSCOPY (EGD);  Surgeon: Irving Copas., MD;  Location: [facility];  Service: Gastroenterology;  Laterality: N/A;  . ESOPHAGOGASTRODUODENOSCOPY (EGD) WITH PROPOFOL N/A 07/18/2019   Procedure: ESOPHAGOGASTRODUODENOSCOPY (EGD) WITH PROPOFOL;  Surgeon: Lavena Bullion, DO;  Location: [facility];  Service: Gastroenterology;  Laterality: N/A;  . ESOPHAGOGASTRODUODENOSCOPY (EGD) WITH PROPOFOL N/A 08/18/2019   Procedure: ESOPHAGOGASTRODUODENOSCOPY (EGD) WITH PROPOFOL;  Surgeon: Irene Shipper, MD;  Location: [facility];  Service: Endoscopy;  Laterality: N/A;  . ESOPHAGOGASTRODUODENOSCOPY (EGD) WITH PROPOFOL N/A 08/21/2019   Procedure: ESOPHAGOGASTRODUODENOSCOPY (EGD) WITH PROPOFOL;  Surgeon: Rush Landmark Telford Nab., MD;  Location: [facility];  Service: Gastroenterology;  Laterality: N/A;  . ESOPHAGOGASTRODUODENOSCOPY (EGD) WITH PROPOFOL N/A 08/23/2019   Procedure: ESOPHAGOGASTRODUODENOSCOPY (EGD) WITH PROPOFOL;  Surgeon: Rush Landmark Telford Nab., MD;  Location: [facility];  Service: Gastroenterology;  Laterality: N/A;  . ESOPHAGOGASTRODUODENOSCOPY (EGD) WITH PROPOFOL N/A 09/05/2019   Procedure: ESOPHAGOGASTRODUODENOSCOPY (EGD) WITH PROPOFOL;  Surgeon: Rush Landmark Telford Nab., MD;  Location: [place];  Service: Gastroenterology;  Laterality: N/A;   With pancreatic pseudocyst necrosectomy via established cyst gastrostomy  . ESOPHAGOGASTRODUODENOSCOPY (EGD) WITH PROPOFOL N/A 09/18/2019   Procedure: ESOPHAGOGASTRODUODENOSCOPY (EGD) WITH PROPOFOL;  Surgeon: Rush Landmark Telford Nab., MD;  Location: [facility];  Service: Gastroenterology;  Laterality: N/A;  . EUS  08/23/2019   Procedure: UPPER ENDOSCOPIC ULTRASOUND (EUS) LINEAR;  Surgeon: Irving Copas., MD;  Location: [facility];  Service: Gastroenterology;;  . FOREIGN BODY REMOVAL  09/05/2019   Procedure: FOREIGN BODY REMOVAL;  Surgeon: Irving Copas., MD;  Location: [place];  Service: Gastroenterology;;  . FOREIGN BODY REMOVAL  09/18/2019   Procedure: FOREIGN BODY REMOVAL;  Surgeon: Irving Copas., MD;  Location: [facility];  Service: Gastroenterology;;  . HOLMIUM LASER APPLICATION Bilateral 31/51/7616   Procedure: HOLMIUM LASER APPLICATION;  Surgeon: Bernestine Amass, MD;  Location: [facility];  Service: Urology;  Laterality: Bilateral;  . LAPAROSCOPIC APPENDECTOMY N/A 05/22/2014   Procedure: APPENDECTOMY LAPAROSCOPIC;  Surgeon: Excell Seltzer, MD;  Location: [facility];  Service: General;  Laterality: N/A;  . PANCREATIC STENT PLACEMENT  08/23/2019   Procedure: PANCREATIC STENT PLACEMENT;  Surgeon: Rush Landmark Telford Nab., MD;  Location: [facility];  Service: Gastroenterology;;  . PANCREATIC STENT PLACEMENT  09/05/2019   Procedure: PANCREATIC STENT PLACEMENT;  Surgeon: Irving Copas., MD;  Location:  [facility];  Service: Gastroenterology;;  . PANCREATIC STENT PLACEMENT  09/18/2019   Procedure: PANCREATIC STENT PLACEMENT;  Surgeon: Meridee ScoreMansouraty, Netty StarringGabriel  Jr., MD;  Location: [facility];  Service: Gastroenterology;;  cyst gastrostomy double pigtail stent placement   . STENT REMOVAL  08/26/2019   Procedure: STENT REMOVAL;  Surgeon: Lemar LoftyMansouraty, Gabriel Jr., MD;  [facility];  Service: Gastroenterology;;  . Francine GravenSTENT REMOVAL  09/05/2019   Procedure: STENT REMOVAL;  Surgeon: Lemar LoftyMansouraty, Gabriel Jr., MD;  [facility];  Service: Gastroenterology;;  . Francine GravenSTENT REMOVAL  09/18/2019   Procedure: STENT REMOVAL;  Surgeon: Lemar LoftyMansouraty, Gabriel Jr., MD;  [facility];  Service: Gastroenterology;;  . TONSILLECTOMY  age 54  . UPPER ESOPHAGEAL ENDOSCOPIC ULTRASOUND (EUS) N/A 08/21/2019   Procedure: UPPER ESOPHAGEAL ENDOSCOPIC ULTRASOUND (EUS);  Surgeon: Lemar LoftyMansouraty, Gabriel Jr., MD;  [facility];  Service: Gastroenterology;  Laterality: N/A;  For cyst gastrostomy     OB History   No obstetric history on file.     Family History  Family history unknown: Yes    Social History   Tobacco Use  . Smoking status: Never Smoker  . Smokeless tobacco: Never Used  Substance Use Topics  . Alcohol use: No  . Drug use: No    Home Medications Prior to Admission medications   Medication Sig Start Date End Date Taking? Authorizing Provider  albuterol (VENTOLIN HFA) 108 (90 Base) MCG/ACT inhaler Inhale 1-2 puffs into the lungs every 6 (six) hours as needed for wheezing or shortness of breath. 07/21/19   Regalado, Belkys A, MD  amitriptyline (ELAVIL) 75 MG tablet Take 75 mg by mouth at bedtime. 08/10/19   [provider]  amLODipine (NORVASC) 5 MG tablet Take 5 mg by mouth daily.    [provider]  amoxicillin-clavulanate (AUGMENTIN) 875-125 MG tablet Take 1 tablet by mouth 2 (two) times daily for 14 days. 10/19/19 11/02/19  Reva BoresPratt, Tanya S, MD  atorvastatin (LIPITOR) 40 MG tablet Take 40 mg by mouth daily.    [provider]  clonazePAM (KLONOPIN) 0.5 MG tablet Take 0.5 mg by mouth daily.  05/23/19   [provider]  JANUMET 50-1000 MG tablet Take 1 tablet by mouth 2 (two) times daily. 09/24/19   [provider]  lamoTRIgine (LAMICTAL) 100 MG tablet Take 100 mg by mouth daily. 09/22/19   [provider]  LANTUS SOLOSTAR 100 UNIT/ML Solostar Pen Inject 40 Units into the skin at bedtime. Patient taking differently: Inject 50 Units into the skin at bedtime.  10/01/19   Layne BentonBiby, Sharon L, NP  LATUDA 60 MG TABS Take 1 tablet by mouth at bedtime. 08/10/19   [provider]  losartan (COZAAR) 25 MG tablet Take 25 mg by mouth daily.    [provider]  ondansetron (ZOFRAN) 8 MG tablet Take 1 tablet (8 mg total) by mouth every 8 (eight) hours as needed for nausea or vomiting. 08/19/19   Regalado, Belkys A, MD  Oxycodone HCl 10 MG TABS Take 10 mg by mouth every 4 (four) hours as needed (pain).  08/12/19   [provider]  pantoprazole (PROTONIX) 40 MG tablet Take 1 tablet (40 mg total) by mouth 2 (two) times daily for 28 days. 10/19/19 11/16/19  Reva BoresPratt, Tanya S, MD  promethazine-codeine [facility](PHENERGAN WITH CODEINE) 6.25-10 MG/5ML syrup Take 5 mLs by mouth every 6 (six) hours as needed for up to 2 days (pain and nausea). 10/28/19 10/30/19  Ronnie DossMcLean, Kristia Jupiter A, PA-C  propranolol (INDERAL) 20 MG tablet  Take 20 mg by mouth 2 (two) times daily.    [provider]  risperiDONE (RISPERDAL) 0.5 MG tablet Take 0.5 mg by mouth 2 (two) times daily. 09/22/19   [provider]  traZODone (DESYREL) 100 MG tablet Take 100 mg by mouth at bedtime.    [provider]    Allergies    Patient has no known allergies.  Review of Systems   Review of Systems  Constitutional: Positive for appetite change. Negative for chills and fever.  HENT: Negative for congestion and sore throat.   Respiratory: Negative for cough and shortness of breath.   Cardiovascular: Negative for chest pain, palpitations and leg swelling.  Gastrointestinal: Positive for abdominal pain, diarrhea, nausea  and vomiting.  Genitourinary: Negative for dysuria and pelvic pain.  Musculoskeletal: Negative for back pain.  Skin: Negative for rash.  All other systems reviewed and are negative.   Physical Exam Updated Vital Signs BP 118/75   Pulse 95   Temp 98.7 F (37.1 C) (Oral)   Resp 16   Ht 5\' 8"  (1.727 m)   Wt 129.7 kg   LMP 03/23/2014 Comment: (-) u preg?/ac  SpO2 100%   BMI 43.49 kg/m   Physical Exam Vitals and nursing note reviewed.  Constitutional:      General: She is not in acute distress.    Appearance: Normal appearance. She is obese. She is not ill-appearing, toxic-appearing or diaphoretic.  HENT:     Head: Normocephalic.  Eyes:     Conjunctiva/sclera: Conjunctivae normal.  Cardiovascular:     Rate and Rhythm: Normal rate and regular rhythm.  Pulmonary:     Effort: Pulmonary effort is normal.  Abdominal:     Tenderness: There is abdominal tenderness in the epigastric area. There is no right CVA tenderness, left CVA tenderness, guarding or rebound.  Genitourinary:    Adnexa:        Right: Fullness: .kmdi.   Skin:    General: Skin is dry.  Neurological:     Mental Status: She is alert.  Psychiatric:        Mood and Affect: Mood normal.     ED Results / Procedures / Treatments   Labs (all labs ordered are listed, but only abnormal results are displayed) Labs Reviewed  COMPREHENSIVE METABOLIC PANEL - Abnormal; Notable for the following components:      Result Value   Chloride 97 (*)    Glucose, Bld 449 (*)    Creatinine, Ser 1.06 (*)    Total Bilirubin 0.2 (*)    All other components within normal limits  CBC WITH DIFFERENTIAL/PLATELET - Abnormal; Notable for the following components:   Hemoglobin 10.5 (*)    HCT 35.4 (*)    MCV 74.1 (*)    MCH 22.0 (*)    MCHC 29.7 (*)    RDW 16.8 (*)    All other components within normal limits  LIPASE, BLOOD    EKG None  Radiology No results found.  Procedures Procedures (including critical care  time)  Medications Ordered in ED Medications  sodium chloride 0.9 % bolus 1,000 mL (0 mLs Intravenous Stopped 10/28/19 2217)  ondansetron (ZOFRAN) injection 4 mg (4 mg Intravenous Given 10/28/19 2116)  morphine 4 MG/ML injection 4 mg (4 mg Intravenous Given 10/28/19 2116)    ED Course  I have reviewed the triage vital signs and the nursing notes.  Pertinent labs & imaging results that were available during my care of the patient were reviewed  by me and considered in my medical decision making (see chart for details).  Clinical Course as of Oct 28 2251  Mon Oct 28, 2019  2214 Patient presenting with epigastric pain since today.  She is well-appearing and afebrile on exam.  Her labs are reassuring.  She does have elevated glucose and a history of diabetes.  She is not in DKA.  She got complete relief of her pain with fluids, morphine and Zofran.  She reports similar pain in the past she was prescribed Phenergan with codeine and requests a prescription for this.  Advised her to follow-up with her primary medical doctor.  Advised on return precautions   [KM]    Clinical Course User Index [KM] Jeral Pinch   MDM Rules/Calculators/A&P                      Based on review of vitals, medical screening exam, lab work and/or imaging, there does not appear to be an acute, emergent etiology for the patient's symptoms. Counseled pt on good return precautions and encouraged both PCP and ED follow-up as needed.  Prior to discharge, I also discussed incidental imaging findings with patient in detail and advised appropriate, recommended follow-up in detail.  Clinical Impression: 1. Epigastric pain     Disposition: Discharge  Prior to providing a prescription for a controlled substance, I independently reviewed the patient's recent prescription history on the [place] Controlled Substance Reporting System. The patient had no recent or regular prescriptions and was deemed appropriate for a  brief, less than 3 day prescription of narcotic for acute analgesia.  This note was prepared with assistance of Conservation officer, historic buildings. Occasional wrong-word or sound-a-like substitutions may have occurred due to the inherent limitations of voice recognition software.  Final Clinical Impression(s) / ED Diagnoses Final diagnoses:  Epigastric pain    Rx / DC Orders ED Discharge Orders         Ordered    promethazine-codeine (PHENERGAN WITH CODEINE) 6.25-10 MG/5ML syrup  Every 6 hours PRN     10/28/19 2216           Jeral Pinch 10/28/19 2253    Pollyann Savoy, MD 10/28/19 [phone]

## 2019-10-30 ENCOUNTER — Encounter: Payer: Self-pay | Admitting: Gastroenterology

## 2019-10-30 NOTE — Telephone Encounter (Signed)
Patient is calling back about below. She states she received a letter saying that Dr. Meridee Score would refill medication. I let her know that the letter said if she was having problems/questions to call back not that he would prescribe phenergan. She asked to speak with Dr. Lestine Mount nurse about problems. She is having abdominal pains and is throwing up.

## 2019-10-30 NOTE — Telephone Encounter (Signed)
OPENED IN ERROR

## 2019-10-30 NOTE — Telephone Encounter (Signed)
Phone went straight to VM and a message that the VM has not been set up.

## 2019-11-03 ENCOUNTER — Emergency Department (HOSPITAL_BASED_OUTPATIENT_CLINIC_OR_DEPARTMENT_OTHER): Payer: Medicare Other

## 2019-11-03 ENCOUNTER — Other Ambulatory Visit: Payer: Self-pay

## 2019-11-03 ENCOUNTER — Emergency Department (HOSPITAL_BASED_OUTPATIENT_CLINIC_OR_DEPARTMENT_OTHER)
Admission: EM | Admit: 2019-11-03 | Discharge: 2019-11-04 | Disposition: A | Discharge status: Home / Self Care | Payer: Medicare Other | Attending: Emergency Medicine | Admitting: Emergency Medicine

## 2019-11-03 ENCOUNTER — Encounter (HOSPITAL_BASED_OUTPATIENT_CLINIC_OR_DEPARTMENT_OTHER): Payer: Self-pay | Admitting: *Deleted

## 2019-11-03 DIAGNOSIS — Z7951 Long term (current) use of inhaled steroids: Secondary | ICD-10-CM | POA: Insufficient documentation

## 2019-11-03 DIAGNOSIS — I10 Essential (primary) hypertension: Secondary | ICD-10-CM | POA: Diagnosis not present

## 2019-11-03 DIAGNOSIS — Z79899 Other long term (current) drug therapy: Secondary | ICD-10-CM | POA: Insufficient documentation

## 2019-11-03 DIAGNOSIS — E119 Type 2 diabetes mellitus without complications: Secondary | ICD-10-CM | POA: Insufficient documentation

## 2019-11-03 DIAGNOSIS — J209 Acute bronchitis, unspecified: Secondary | ICD-10-CM | POA: Diagnosis not present

## 2019-11-03 DIAGNOSIS — Z20822 Contact with and (suspected) exposure to covid-19: Secondary | ICD-10-CM | POA: Diagnosis not present

## 2019-11-03 DIAGNOSIS — R0602 Shortness of breath: Secondary | ICD-10-CM

## 2019-11-03 DIAGNOSIS — J45909 Unspecified asthma, uncomplicated: Secondary | ICD-10-CM | POA: Insufficient documentation

## 2019-11-03 DIAGNOSIS — J40 Bronchitis, not specified as acute or chronic: Secondary | ICD-10-CM

## 2019-11-03 IMAGING — DX DG CHEST 1V PORT
1 series · 1 of 1 positions shown · non-contrast
Comparison: [DATE]

CLINICAL DATA: Respiratory difficulty

EXAM:
PORTABLE CHEST 1 VIEW

[chest ap]
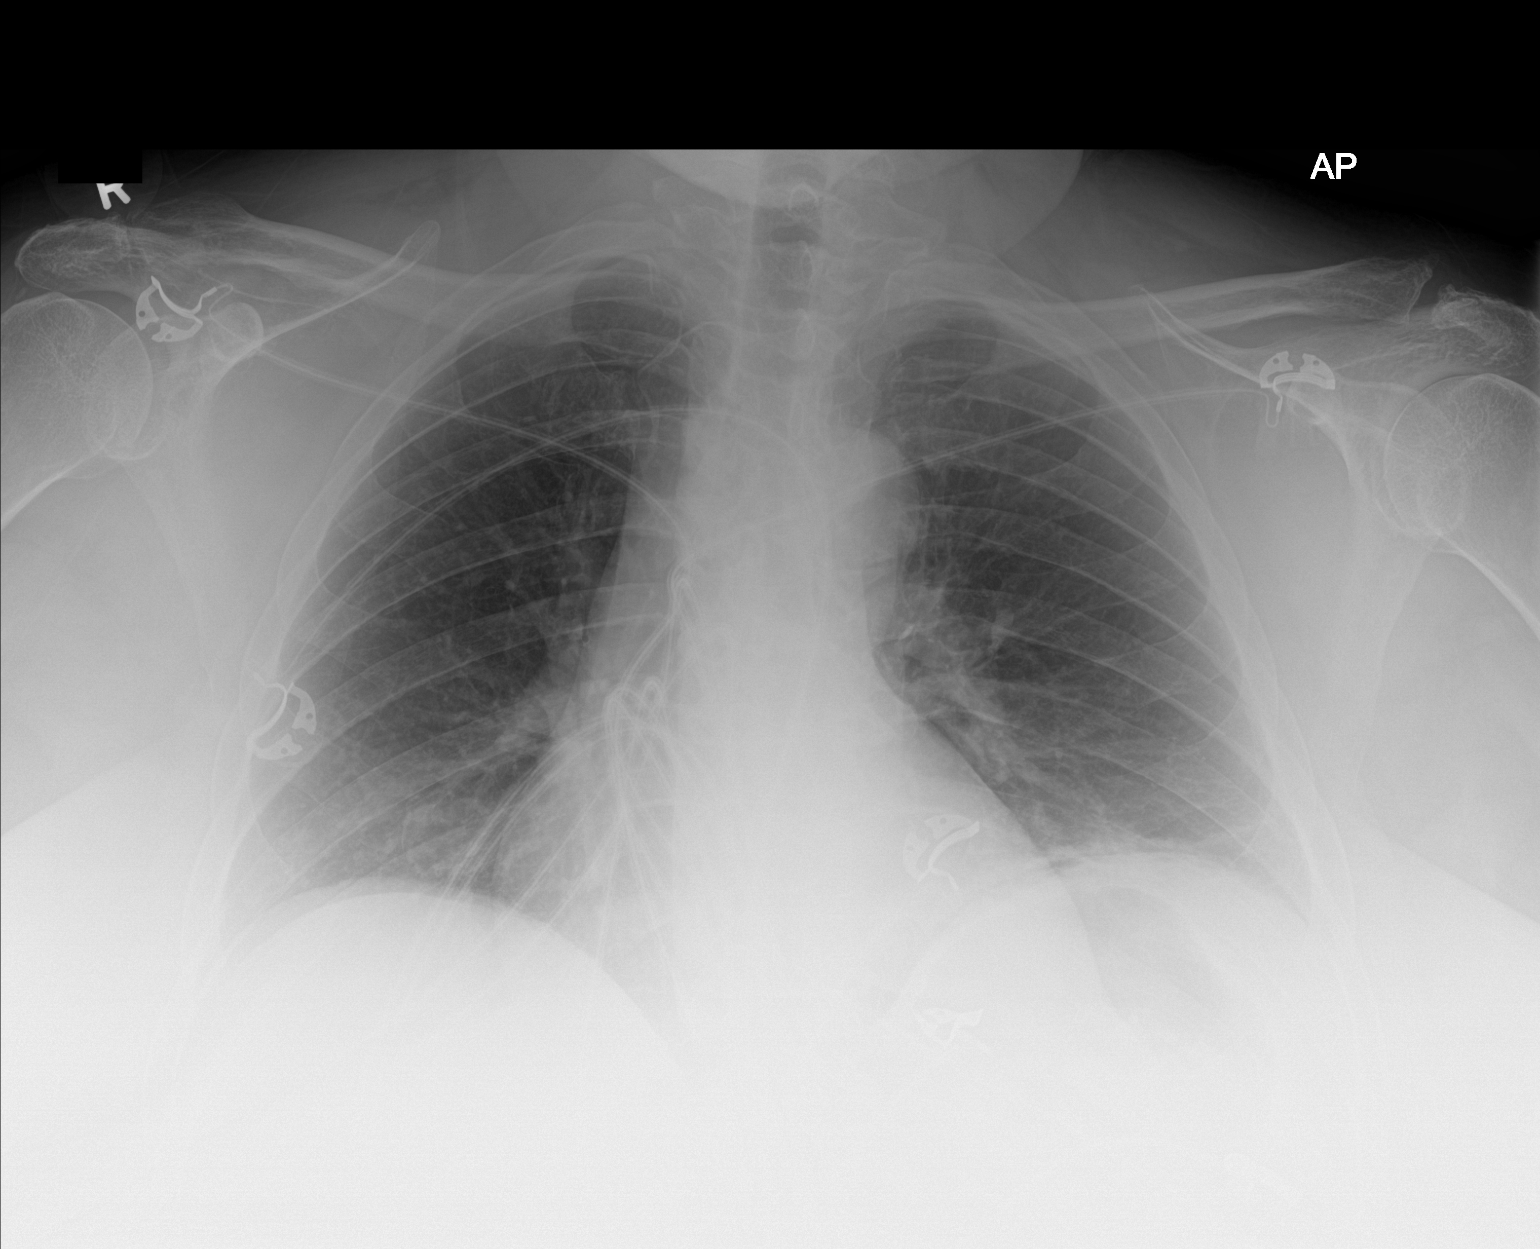

[1 of 1 positions shown; findings below may reference images not displayed]

FINDINGS: Heart is normal size. Left base atelectasis. Right lung clear. No
effusions. No acute bony abnormality.
IMPRESSION: Left base atelectasis.

## 2019-11-03 MED ORDER — ALBUTEROL SULFATE HFA 108 (90 BASE) MCG/ACT IN AERS
8.0000 | INHALATION_SPRAY | Freq: Once | RESPIRATORY_TRACT | Status: AC
  Administered 2019-11-03: 8 via RESPIRATORY_TRACT
  Filled 2019-11-03: qty 6.7

## 2019-11-03 NOTE — ED Provider Notes (Signed)
[facility] EMERGENCY DEPARTMENT Provider Note   CSN: [id number] Arrival date & time: 11/03/19  2204     History Chief Complaint  Patient presents with  . Shortness of Breath    Monique Holland is a 54 y.o. female.  Patient with history of diabetes, bipolar disorder, hypertension, sleep apnea not on CPAP presenting with acute onset of shortness of breath and chest tightness.  States this occurred around 5 PM.  She feels like she cannot catch her breath and she is feels tight in her chest.  She is wheezing on arrival and received albuterol.  Her cough is productive of white mucus.  She did have 1 episode of posttussive emesis.  Denies any abdominal pain or diarrhea.  No travel or sick contact.  No headaches.  Denies any body aches.  Did receive both Covid vaccines.  No recent sick contacts or travel.  Does have a history of chronic pancreatitis as well and is currently on antibiotics with 2 more days remaining.  The history is provided by the patient.  Shortness of Breath Associated symptoms: cough and vomiting   Associated symptoms: no abdominal pain, no chest pain, no fever, no headaches, no rash and no sore throat        Past Medical History:  Diagnosis Date  . Anxiety   . Asthma   . Bipolar disorder (HCC)   . Depression   . Diabetes mellitus without complication (HCC)   . Frequency of urination   . GERD (gastroesophageal reflux disease)   . History of asthma    last episode yrs ago  . Hypertension   . OSA (obstructive sleep apnea)    pt had study done oct 2014--  schedule for cpap titrate after kidney stone surgery  . Pancreatitis   . Pseudocyst of pancreas   . Schizophrenia (HCC)   . Ureteral calculi    BILATERAL  . Wears glasses     Patient Active Problem List   Diagnosis Date Noted  . Prolonged QT interval 10/17/2019  . Pancreatitis 10/16/2019  . Hyperlipidemia 10/01/2019  . OSA (obstructive sleep apnea) 10/01/2019  . GERD (gastroesophageal reflux  disease) 10/01/2019  . Right sided weakness 09/29/2019  . Stroke-like episode s/p tPA 09/29/2019  . History of pancreatitis   . Intractable abdominal pain 09/15/2019  . Hyperosmolar hyperglycemic state (HHS) (HCC) 09/15/2019  . Non-intractable vomiting   . Hematemesis with nausea   . Abdominal pain 08/03/2019  . Essential hypertension 08/03/2019  . Pancreatic pseudocyst   . Nausea and vomiting in adult   . Necrotizing pancreatitis   . Gastritis and gastroduodenitis   . Candida esophagitis (HCC)   . Acute pancreatitis 07/17/2019  . Schizophrenia (HCC) 09/02/2018  . Obesity, Class III, BMI 40-49.9 (morbid obesity) (HCC) 09/02/2018  . Iron deficiency 09/02/2018  . Symptomatic anemia 09/01/2018  . Hypokalemia 09/01/2018  . Severe sepsis (HCC) 08/11/2018  . AKI (acute kidney injury) (HCC) 08/11/2018  . Hypotension 08/11/2018  . Type 2 diabetes mellitus (HCC) 08/11/2018  . Acute appendicitis 05/22/2014  . Ureteral calculus, right 05/02/2013    Past Surgical History:  Procedure Laterality Date  . BALLOON DILATION N/A 08/23/2019   Procedure: BALLOON DILATION;  Surgeon: Meridee Score Netty Starring., MD;  Location: [facility];  Service: Gastroenterology;  Laterality: N/A;  . BIOPSY  07/18/2019   Procedure: BIOPSY;  Surgeon: Shellia Cleverly, DO;  Location: [facility];  Service: Gastroenterology;;  . BIOPSY  08/23/2019   Procedure: BIOPSY;  Surgeon: Corliss Parish  Montez HagemanJr., MD;  [facility];  Service: Gastroenterology;;  . BIOPSY  09/18/2019   Procedure: BIOPSY;  Surgeon: Lemar LoftyMansouraty, Gabriel Jr., MD;  [facility];  Service: Gastroenterology;;  . BIOPSY  10/18/2019   Procedure: BIOPSY;  Surgeon: Lemar LoftyMansouraty, Gabriel Jr., MD;  [facility];  Service: Gastroenterology;;  . CESAREAN SECTION  1991  &  2002   w/ bilateral tubal ligation in 2002  . CYSTOSCOPY W/ URETERAL STENT PLACEMENT Right 08/10/2018   Procedure: CYSTOSCOPY WITH RETROGRADE PYELOGRAM/URETERAL STENT  PLACEMENT;  Surgeon: Jerilee FieldEskridge, Matthew, MD;  Location: [facility];  Service: Urology;  Laterality: Right;  . CYSTOSCOPY WITH RETROGRADE PYELOGRAM, URETEROSCOPY AND STENT PLACEMENT Bilateral 05/02/2013   Procedure: CYSTOSCOPY WITH RETROGRADE PYELOGRAM, URETEROSCOPY ;  Surgeon: Valetta Fulleravid S Grapey, MD;  [facility];  Service: Urology;  Laterality: Bilateral;  . CYSTOSCOPY WITH RETROGRADE PYELOGRAM, URETEROSCOPY AND STENT PLACEMENT Right 08/15/2018   Procedure: CYSTOSCOPY WITH RIGHT RETROGRADE PYELOGRAM, RIGHT URETEROSCOPY WITH HOLMIUM LASER AND STENT PLACEMENT;  Surgeon: Crista ElliotBell, Eugene D III, MD;  Location: [facility];  Service: Urology;  Laterality: Right;  . CYSTOSCOPY WITH STENT PLACEMENT Left 05/02/2013   Procedure: CYSTOSCOPY WITH STENT PLACEMENT;  Surgeon: Valetta Fulleravid S Grapey, MD;  [facility] [place];  Service: Urology;  Laterality: Left;  . ENDOROTOR N/A 08/26/2019   Procedure: ZOXWRUEAVENDOROTOR;  Surgeon: Mansouraty, Netty StarringGabriel Jr., MD;  [facility];  Service: Gastroenterology;  Laterality: N/A;  . ENDOROTOR  09/05/2019   Procedure: [id number];  Surgeon: Mansouraty, Netty StarringGabriel Jr., MD;  [facility];  Service: Gastroenterology;;  . ESOPHAGOGASTRODUODENOSCOPY N/A 08/26/2019   Procedure: ESOPHAGOGASTRODUODENOSCOPY (EGD) with NECROSECTOMY;  Surgeon: Lemar LoftyMansouraty, Gabriel Jr., MD;  [facility];  Service: Gastroenterology;  Laterality: N/A;  . ESOPHAGOGASTRODUODENOSCOPY N/A 10/18/2019   Procedure: ESOPHAGOGASTRODUODENOSCOPY (EGD);  Surgeon: Lemar LoftyMansouraty, Gabriel Jr., MD;  [facility];  Service: Gastroenterology;  Laterality: N/A;  . ESOPHAGOGASTRODUODENOSCOPY (EGD) WITH PROPOFOL N/A 07/18/2019   Procedure: ESOPHAGOGASTRODUODENOSCOPY (EGD) WITH PROPOFOL;  Surgeon: Shellia Cleverlyirigliano, Vito V, DO;  [facility]: [facility];  Service: Gastroenterology;  Laterality: N/A;  . ESOPHAGOGASTRODUODENOSCOPY (EGD) WITH PROPOFOL N/A 08/18/2019   Procedure: ESOPHAGOGASTRODUODENOSCOPY  (EGD) WITH PROPOFOL;  Surgeon: Hilarie FredricksonPerry, John N, MD;  Location: [facility];  Service: Endoscopy;  Laterality: N/A;  . ESOPHAGOGASTRODUODENOSCOPY (EGD) WITH PROPOFOL N/A 08/21/2019   Procedure: ESOPHAGOGASTRODUODENOSCOPY (EGD) WITH PROPOFOL;  Surgeon: Meridee ScoreMansouraty, Netty StarringGabriel Jr., MD;  Location: [facility];  Service: Gastroenterology;  Laterality: N/A;  . ESOPHAGOGASTRODUODENOSCOPY (EGD) WITH PROPOFOL N/A 08/23/2019   Procedure: ESOPHAGOGASTRODUODENOSCOPY (EGD) WITH PROPOFOL;  Surgeon: Meridee ScoreMansouraty, Netty StarringGabriel Jr., MD;  Location: [facility];  Service: Gastroenterology;  Laterality: N/A;  . ESOPHAGOGASTRODUODENOSCOPY (EGD) WITH PROPOFOL N/A 09/05/2019   Procedure: ESOPHAGOGASTRODUODENOSCOPY (EGD) WITH PROPOFOL;  Surgeon: Meridee ScoreMansouraty, Netty StarringGabriel Jr., MD;  [facility];  Service: Gastroenterology;  Laterality: N/A;   With pancreatic pseudocyst necrosectomy via established cyst gastrostomy  . ESOPHAGOGASTRODUODENOSCOPY (EGD) WITH PROPOFOL N/A 09/18/2019   Procedure: ESOPHAGOGASTRODUODENOSCOPY (EGD) WITH PROPOFOL;  Surgeon: Meridee ScoreMansouraty, Netty StarringGabriel Jr., MD;  [facility];  Service: Gastroenterology;  Laterality: N/A;  . EUS  08/23/2019   Procedure: UPPER ENDOSCOPIC ULTRASOUND (EUS) LINEAR;  Surgeon: Lemar LoftyMansouraty, Gabriel Jr., MD;  [facility];  Service: Gastroenterology;;  . FOREIGN BODY REMOVAL  09/05/2019   Procedure: FOREIGN BODY REMOVAL;  Surgeon: Lemar LoftyMansouraty, Gabriel Jr., MD;  [facility];  Service: Gastroenterology;;  . FOREIGN BODY REMOVAL  09/18/2019   Procedure: FOREIGN BODY REMOVAL;  Surgeon: Lemar LoftyMansouraty, Gabriel Jr., MD;  Location: [facility];  Service: Gastroenterology;;  .  HOLMIUM LASER APPLICATION Bilateral 05/02/2013   Procedure: HOLMIUM LASER APPLICATION;  Surgeon: Valetta Fuller, MD;  Location: [facility];  Service: Urology;  Laterality: Bilateral;  . LAPAROSCOPIC APPENDECTOMY N/A 05/22/2014   Procedure: APPENDECTOMY LAPAROSCOPIC;  Surgeon: Glenna Fellows, MD;  Location: [facility];  Service: General;  Laterality: N/A;  . PANCREATIC STENT PLACEMENT  08/23/2019   Procedure: PANCREATIC STENT PLACEMENT;  Surgeon: Lemar Lofty., MD;  Location: [facility];  Service: Gastroenterology;;  . PANCREATIC STENT PLACEMENT  09/05/2019   Procedure: PANCREATIC STENT PLACEMENT;  Surgeon: Lemar Lofty., MD;  [facility];  Service: Gastroenterology;;  . PANCREATIC STENT PLACEMENT  09/18/2019   Procedure: PANCREATIC STENT PLACEMENT;  Surgeon: Lemar Lofty., MD;  Location: [facility];  Service: Gastroenterology;;  cyst gastrostomy double pigtail stent placement   . STENT REMOVAL  08/26/2019   Procedure: STENT REMOVAL;  Surgeon: Lemar Lofty., MD;  [facility];  Service: Gastroenterology;;  . Francine Graven REMOVAL  09/05/2019   Procedure: STENT REMOVAL;  Surgeon: Lemar Lofty., MD;  Location: [facility];  Service: Gastroenterology;;  . Francine Graven REMOVAL  09/18/2019   Procedure: STENT REMOVAL;  Surgeon: Lemar Lofty., MD;  Location: [facility];  Service: Gastroenterology;;  . TONSILLECTOMY  age 43  . UPPER ESOPHAGEAL ENDOSCOPIC ULTRASOUND (EUS) N/A 08/21/2019   Procedure: UPPER ESOPHAGEAL ENDOSCOPIC ULTRASOUND (EUS);  Surgeon: Lemar Lofty., MD;  Location: [facility];  Service: Gastroenterology;  Laterality: N/A;  For cyst gastrostomy     OB History   No obstetric history on file.     Family History  Family history unknown: Yes    Social History   Tobacco Use  . Smoking status: Never Smoker  . Smokeless tobacco: Never Used  Vaping Use  . Vaping Use: Never used  Substance Use Topics  . Alcohol use: No  . Drug use: No    Home Medications Prior to Admission medications   Medication Sig Start Date End Date Taking? Authorizing Provider  albuterol (VENTOLIN HFA) 108 (90 Base) MCG/ACT inhaler Inhale 1-2 puffs into the lungs every 6 (six) hours as needed for wheezing or  shortness of breath. 07/21/19   Regalado, Belkys A, MD  amitriptyline (ELAVIL) 75 MG tablet Take 75 mg by mouth at bedtime. 08/10/19   [provider]  amLODipine (NORVASC) 5 MG tablet Take 5 mg by mouth daily.    [provider]  atorvastatin (LIPITOR) 40 MG tablet Take 40 mg by mouth daily.    [provider]  clonazePAM (KLONOPIN) 0.5 MG tablet Take 0.5 mg by mouth daily.  05/23/19   [provider]  JANUMET 50-1000 MG tablet Take 1 tablet by mouth 2 (two) times daily. 09/24/19   [provider]  lamoTRIgine (LAMICTAL) 100 MG tablet Take 100 mg by mouth daily. 09/22/19   [provider]  LANTUS SOLOSTAR 100 UNIT/ML Solostar Pen Inject 40 Units into the skin at bedtime. Patient taking differently: Inject 50 Units into the skin at bedtime.  10/01/19   Layne Benton, NP  LATUDA 60 MG TABS Take 1 tablet by mouth at bedtime. 08/10/19   [provider]  losartan (COZAAR) 25 MG tablet Take 25 mg by mouth daily.    [provider]  ondansetron (ZOFRAN) 8 MG tablet Take 1 tablet (8 mg total) by mouth every 8 (eight) hours as needed for nausea or vomiting. 08/19/19   Regalado, Belkys A, MD  Oxycodone HCl 10 MG TABS Take  10 mg by mouth every 4 (four) hours as needed (pain).  08/12/19   [provider]  pantoprazole (PROTONIX) 40 MG tablet Take 1 tablet (40 mg total) by mouth 2 (two) times daily for 28 days. 10/19/19 11/16/19  Reva Bores, MD  propranolol (INDERAL) 20 MG tablet Take 20 mg by mouth 2 (two) times daily.    [provider]  risperiDONE (RISPERDAL) 0.5 MG tablet Take 0.5 mg by mouth 2 (two) times daily. 09/22/19   [provider]  traZODone (DESYREL) 100 MG tablet Take 100 mg by mouth at bedtime.    [provider]    Allergies    Patient has no known allergies.  Review of Systems   Review of Systems  Constitutional: Positive for activity change, appetite change and fatigue. Negative for  fever.  HENT: Positive for congestion. Negative for sore throat.   Eyes: Negative for visual disturbance.  Respiratory: Positive for cough, chest tightness and shortness of breath.   Cardiovascular: Negative for chest pain and leg swelling.  Gastrointestinal: Positive for vomiting. Negative for abdominal pain and nausea.  Genitourinary: Negative for dysuria and hematuria.  Musculoskeletal: Positive for arthralgias and myalgias.  Skin: Negative for rash.  Neurological: Negative for dizziness, weakness and headaches.   all other systems are negative except as noted in the HPI and PMH.   Physical Exam Updated Vital Signs BP 131/81 (BP Location: Right Arm)   Temp 98.1 F (36.7 C) (Oral)   Resp (!) 22   Ht 5\' 8"  (1.727 m)   Wt 125.2 kg   LMP 03/23/2014 Comment: (-) u preg?/ac  SpO2 100%   BMI 41.97 kg/m   Physical Exam Vitals and nursing note reviewed.  Constitutional:      General: She is in acute distress.     Appearance: She is well-developed. She is obese.     Comments: Dyspneic with conversation, mildly increased work of breathing  HENT:     Head: Normocephalic and atraumatic.     Mouth/Throat:     Pharynx: No oropharyngeal exudate.  Eyes:     Conjunctiva/sclera: Conjunctivae normal.     Pupils: Pupils are equal, round, and reactive to light.  Neck:     Comments: No meningismus. Cardiovascular:     Rate and Rhythm: Normal rate and regular rhythm.     Heart sounds: Normal heart sounds. No murmur heard.   Pulmonary:     Effort: Pulmonary effort is normal. No respiratory distress.     Breath sounds: Wheezing and rhonchi present.  Chest:     Chest wall: No tenderness.  Abdominal:     Palpations: Abdomen is soft.     Tenderness: There is no abdominal tenderness. There is no guarding or rebound.  Musculoskeletal:        General: No tenderness. Normal range of motion.     Cervical back: Normal range of motion and neck supple.  Skin:    General: Skin is warm.      Capillary Refill: Capillary refill takes less than 2 seconds.  Neurological:     General: No focal deficit present.     Mental Status: She is alert and oriented to person, place, and time. Mental status is at baseline.     Cranial Nerves: No cranial nerve deficit.     Motor: No abnormal muscle tone.     Coordination: Coordination normal.     Comments:  5/5 strength throughout. CN 2-12 intact.Equal grip strength.   Psychiatric:  Behavior: Behavior normal.     ED Results / Procedures / Treatments   Labs (all labs ordered are listed, but only abnormal results are displayed) Labs Reviewed  CBC WITH DIFFERENTIAL/PLATELET - Abnormal; Notable for the following components:      Result Value   Hemoglobin 10.0 (*)    HCT 34.1 (*)    MCV 74.6 (*)    MCH 21.9 (*)    MCHC 29.3 (*)    RDW 17.1 (*)    All other components within normal limits  COMPREHENSIVE METABOLIC PANEL - Abnormal; Notable for the following components:   Potassium 3.3 (*)    Glucose, Bld 356 (*)    Creatinine, Ser 1.24 (*)    Calcium 8.8 (*)    GFR calc non Af Amer 50 (*)    GFR calc Af Amer 58 (*)    All other components within normal limits  SARS CORONAVIRUS 2 BY RT PCR (HOSPITAL ORDER, [place] [facility])  D-DIMER, QUANTITATIVE (NOT AT [facility])  BRAIN NATRIURETIC PEPTIDE  TROPONIN I (HIGH SENSITIVITY)  TROPONIN I (HIGH SENSITIVITY)    EKG EKG Interpretation  Date/Time:  Sunday November 03 2019 23:20:57 EDT Ventricular Rate:  106 PR Interval:    QRS Duration: 89 QT Interval:  392 QTC Calculation: 521 R Axis:   4 Text Interpretation: Sinus tachycardia Borderline T wave abnormalities Prolonged QT interval No significant change was found Confirmed by Ezequiel Essex [phone]) on 11/03/2019 11:41:34 PM   Radiology DG Chest Port 1 View  Result Date: 11/03/2019 CLINICAL DATA:  Respiratory difficulty EXAM: PORTABLE CHEST 1 VIEW COMPARISON:  03/23/2019 FINDINGS: Heart is normal size. Left base  atelectasis. Right lung clear. No effusions. No acute bony abnormality. IMPRESSION: Left base atelectasis. Electronically Signed   By: Rolm Baptise M.D.   On: 11/03/2019 23:48    Procedures Procedures (including critical care time)  Medications Ordered in ED Medications  albuterol (VENTOLIN HFA) 108 (90 Base) MCG/ACT inhaler 8 puff (8 puffs Inhalation Given 11/03/19 2253)    ED Course  I have reviewed the triage vital signs and the nursing notes.  Pertinent labs & imaging results that were available during my care of the patient were reviewed by me and considered in my medical decision making (see chart for details).    MDM Rules/Calculators/A&P                          Lower acute shortness of breath and chest tightness with productive cough and loss of taste and smell.  She is tachypneic but not hypoxic.  Lungs are coarse bilaterally.  Chest x-ray shows atelectasis.  EKG has no acute ischemia. Labs are reassuring with negative troponin and D-dimer.  Low suspicion for ACS or PE. Stable anemia. Hyperglycemia without DKA.  Covid testing is negative.  We will proceed with bronchodilators, steroids and magnesium.  Patient denies any history of bronchitis and is not a smoker.  Troponin negative x2.  Patient able to ambulate without desaturation. Low suspicion for ACS or pulmonary embolism.  Suspect likely viral bronchitis as etiology of her symptoms.  X-rays negative for pneumonia.  Patient feels improved.  On recheck her wheezing has improved and she is moving air well.  States she feels like she is breathing at baseline.  We will continue with bronchodilators and steroids and antibiotics.  Advised to monitor her blood sugars closely while she is taking the steroids due to hyperglycemia side effects.  Follow-up with PCP.  Return precautions discussed  Ronella Plunk was evaluated in Emergency Department on 11/03/2019 for the symptoms described in the history of present illness. She  was evaluated in the context of the global COVID-19 pandemic, which necessitated consideration that the patient might be at risk for infection with the SARS-CoV-2 virus that causes COVID-19. Institutional protocols and algorithms that pertain to the evaluation of patients at risk for COVID-19 are in a state of rapid change based on information released by regulatory bodies including the CDC and federal and state organizations. These policies and algorithms were followed during the patient's care in the ED.  Final Clinical Impression(s) / ED Diagnoses Final diagnoses:  Bronchitis    Rx / DC Orders ED Discharge Orders    None       Meagan Spease, Jeannett Senior, MD 11/04/19 [phone]

## 2019-11-03 NOTE — ED Triage Notes (Addendum)
C/o shortness of breath that started a few hours ago. Pt states she lost her taste and smell yesterday. Pt denies any asthma or copd hx. Expiratory wheezing noted on exam. 02 sats 100% on RA no distress. Has had both covid vaccines

## 2019-11-03 NOTE — ED Notes (Signed)
Attempted PIV twice unsuccessfully; pt tolerated well

## 2019-11-04 DIAGNOSIS — J209 Acute bronchitis, unspecified: Secondary | ICD-10-CM | POA: Diagnosis not present

## 2019-11-04 LAB — CBC WITH DIFFERENTIAL/PLATELET
Abs Immature Granulocytes: 0.02 10*3/uL (ref 0.00–0.07)
Basophils Absolute: 0 10*3/uL (ref 0.0–0.1)
Basophils Relative: 1 %
Eosinophils Absolute: 0.5 10*3/uL (ref 0.0–0.5)
Eosinophils Relative: 8 %
HCT: 34.1 % — ABNORMAL LOW (ref 36.0–46.0)
Hemoglobin: 10 g/dL — ABNORMAL LOW (ref 12.0–15.0)
Immature Granulocytes: 0 %
Lymphocytes Relative: 35 %
Lymphs Abs: 2.2 10*3/uL (ref 0.7–4.0)
MCH: 21.9 pg — ABNORMAL LOW (ref 26.0–34.0)
MCHC: 29.3 g/dL — ABNORMAL LOW (ref 30.0–36.0)
MCV: 74.6 fL — ABNORMAL LOW (ref 80.0–100.0)
Monocytes Absolute: 0.7 10*3/uL (ref 0.1–1.0)
Monocytes Relative: 11 %
Neutro Abs: 2.9 10*3/uL (ref 1.7–7.7)
Neutrophils Relative %: 45 %
Platelets: 200 10*3/uL (ref 150–400)
RBC: 4.57 MIL/uL (ref 3.87–5.11)
RDW: 17.1 % — ABNORMAL HIGH (ref 11.5–15.5)
WBC: 6.3 10*3/uL (ref 4.0–10.5)
nRBC: 0 % (ref 0.0–0.2)

## 2019-11-04 LAB — COMPREHENSIVE METABOLIC PANEL
ALT: 11 U/L (ref 0–44)
AST: 18 U/L (ref 15–41)
Albumin: 3.7 g/dL (ref 3.5–5.0)
Alkaline Phosphatase: 62 U/L (ref 38–126)
Anion gap: 14 (ref 5–15)
BUN: 11 mg/dL (ref 6–20)
CO2: 23 mmol/L (ref 22–32)
Calcium: 8.8 mg/dL — ABNORMAL LOW (ref 8.9–10.3)
Chloride: 98 mmol/L (ref 98–111)
Creatinine, Ser: 1.24 mg/dL — ABNORMAL HIGH (ref 0.44–1.00)
GFR calc Af Amer: 58 mL/min — ABNORMAL LOW (ref >60)
GFR calc non Af Amer: 50 mL/min — ABNORMAL LOW (ref >60)
Glucose, Bld: 356 mg/dL — ABNORMAL HIGH (ref 70–99)
Potassium: 3.3 mmol/L — ABNORMAL LOW (ref 3.5–5.1)
Sodium: 135 mmol/L (ref 135–145)
Total Bilirubin: 0.3 mg/dL (ref 0.3–1.2)
Total Protein: 7.1 g/dL (ref 6.5–8.1)

## 2019-11-04 LAB — D-DIMER, QUANTITATIVE: D-Dimer, Quant: 0.33 ug/mL-FEU (ref 0.00–0.50)

## 2019-11-04 LAB — TROPONIN I (HIGH SENSITIVITY)
Troponin I (High Sensitivity): 2 ng/L (ref <18)
Troponin I (High Sensitivity): 3 ng/L (ref <18)

## 2019-11-04 LAB — SARS CORONAVIRUS 2 BY RT PCR (HOSPITAL ORDER, PERFORMED IN ~~LOC~~ HOSPITAL LAB): SARS Coronavirus 2: NEGATIVE

## 2019-11-04 LAB — BRAIN NATRIURETIC PEPTIDE: B Natriuretic Peptide: 26.3 pg/mL (ref 0.0–100.0)

## 2019-11-04 MED ORDER — DOXYCYCLINE HYCLATE 100 MG PO CAPS
100.0000 mg | ORAL_CAPSULE | Freq: Two times a day (BID) | ORAL | 0 refills | Status: DC
Start: 2019-11-04 — End: 2019-11-17

## 2019-11-04 MED ORDER — MAGNESIUM SULFATE 2 GM/50ML IV SOLN
2.0000 g | Freq: Once | INTRAVENOUS | Status: AC
  Administered 2019-11-04: 2 g via INTRAVENOUS
  Filled 2019-11-04: qty 50

## 2019-11-04 MED ORDER — METHYLPREDNISOLONE SODIUM SUCC 125 MG IJ SOLR
125.0000 mg | Freq: Once | INTRAMUSCULAR | Status: AC
  Administered 2019-11-04: 125 mg via INTRAVENOUS
  Filled 2019-11-04: qty 2

## 2019-11-04 MED ORDER — PREDNISONE 50 MG PO TABS
ORAL_TABLET | ORAL | 0 refills | Status: DC
Start: 2019-11-04 — End: 2019-11-13

## 2019-11-04 MED ORDER — IPRATROPIUM-ALBUTEROL 0.5-2.5 (3) MG/3ML IN SOLN
3.0000 mL | Freq: Once | RESPIRATORY_TRACT | Status: AC
  Administered 2019-11-04: 3 mL via RESPIRATORY_TRACT
  Filled 2019-11-04: qty 3

## 2019-11-04 MED ORDER — ALBUTEROL SULFATE HFA 108 (90 BASE) MCG/ACT IN AERS: 1.0000 | INHALATION_SPRAY | Freq: Four times a day (QID) | RESPIRATORY_TRACT | 0 refills | Status: AC | PRN

## 2019-11-04 NOTE — Discharge Instructions (Signed)
Use the inhaler every 6 hours as needed for difficulty breathing. Take the steroids and antibiotics as prescribed. Watch your blood sugars closely while you are taking the steroids as they can be increased. Return to the ED with worsening chest pain, shortness of breath, any other concerns.

## 2019-11-07 ENCOUNTER — Other Ambulatory Visit (HOSPITAL_COMMUNITY): Payer: Medicare Other

## 2019-11-08 ENCOUNTER — Other Ambulatory Visit: Payer: Self-pay | Admitting: Family Medicine

## 2019-11-08 ENCOUNTER — Telehealth: Payer: Self-pay

## 2019-11-08 NOTE — Telephone Encounter (Signed)
[name] this is a message from Dr Meridee Score from a few weeks ago.  Just making sure you also got the message.  Thank you   "Repeat CTAP in 3-weeks to be scheduled as outpatient and follow up with Dr.Cirgiliano"

## 2019-11-08 NOTE — Telephone Encounter (Signed)
Left message on machine to call back  

## 2019-11-08 NOTE — Telephone Encounter (Signed)
-----   Message from Loretha Stapler, RN sent at 10/18/2019  3:58 PM EDT ----- Pt needs CT scan in 3-4 weeks ROV Dr C in about 2 weeks after CT.  Per VO Dr Judie Petit

## 2019-11-11 ENCOUNTER — Other Ambulatory Visit: Payer: Self-pay | Admitting: Gastroenterology

## 2019-11-11 ENCOUNTER — Ambulatory Visit (HOSPITAL_COMMUNITY): Admit: 2019-11-11 | Payer: Medicare Other | Admitting: Gastroenterology

## 2019-11-11 ENCOUNTER — Encounter (HOSPITAL_COMMUNITY): Payer: Self-pay

## 2019-11-11 DIAGNOSIS — K8591 Acute pancreatitis with uninfected necrosis, unspecified: Secondary | ICD-10-CM

## 2019-11-11 DIAGNOSIS — R112 Nausea with vomiting, unspecified: Secondary | ICD-10-CM

## 2019-11-11 SURGERY — ESOPHAGOGASTRODUODENOSCOPY (EGD) WITH PROPOFOL
Anesthesia: Monitor Anesthesia Care

## 2019-11-11 NOTE — Telephone Encounter (Signed)
Spoke to patient. CT scheduled with labs prior,and follow up with Dr Barron Alvine. Patient will come by the office today to pick up contrast and have labs done.

## 2019-11-12 ENCOUNTER — Inpatient Hospital Stay (HOSPITAL_BASED_OUTPATIENT_CLINIC_OR_DEPARTMENT_OTHER)
Admission: EM | Admit: 2019-11-12 | Discharge: 2019-11-17 | DRG: 638 | Disposition: A | Discharge status: Home / Self Care | Payer: Medicare Other | Attending: Internal Medicine | Admitting: Internal Medicine

## 2019-11-12 ENCOUNTER — Other Ambulatory Visit: Payer: Self-pay

## 2019-11-12 ENCOUNTER — Encounter (HOSPITAL_BASED_OUTPATIENT_CLINIC_OR_DEPARTMENT_OTHER): Payer: Self-pay | Admitting: *Deleted

## 2019-11-12 ENCOUNTER — Emergency Department (HOSPITAL_BASED_OUTPATIENT_CLINIC_OR_DEPARTMENT_OTHER): Payer: Medicare Other

## 2019-11-12 DIAGNOSIS — E785 Hyperlipidemia, unspecified: Secondary | ICD-10-CM | POA: Diagnosis present

## 2019-11-12 DIAGNOSIS — E1165 Type 2 diabetes mellitus with hyperglycemia: Secondary | ICD-10-CM | POA: Diagnosis present

## 2019-11-12 DIAGNOSIS — E1110 Type 2 diabetes mellitus with ketoacidosis without coma: Secondary | ICD-10-CM | POA: Diagnosis not present

## 2019-11-12 DIAGNOSIS — K861 Other chronic pancreatitis: Secondary | ICD-10-CM | POA: Diagnosis present

## 2019-11-12 DIAGNOSIS — I959 Hypotension, unspecified: Secondary | ICD-10-CM | POA: Diagnosis not present

## 2019-11-12 DIAGNOSIS — F319 Bipolar disorder, unspecified: Secondary | ICD-10-CM | POA: Diagnosis present

## 2019-11-12 DIAGNOSIS — E1122 Type 2 diabetes mellitus with diabetic chronic kidney disease: Secondary | ICD-10-CM | POA: Diagnosis present

## 2019-11-12 DIAGNOSIS — Z20822 Contact with and (suspected) exposure to covid-19: Secondary | ICD-10-CM | POA: Diagnosis present

## 2019-11-12 DIAGNOSIS — N182 Chronic kidney disease, stage 2 (mild): Secondary | ICD-10-CM | POA: Diagnosis present

## 2019-11-12 DIAGNOSIS — K219 Gastro-esophageal reflux disease without esophagitis: Secondary | ICD-10-CM | POA: Diagnosis present

## 2019-11-12 DIAGNOSIS — Z87442 Personal history of urinary calculi: Secondary | ICD-10-CM

## 2019-11-12 DIAGNOSIS — R739 Hyperglycemia, unspecified: Secondary | ICD-10-CM

## 2019-11-12 DIAGNOSIS — G4733 Obstructive sleep apnea (adult) (pediatric): Secondary | ICD-10-CM | POA: Diagnosis present

## 2019-11-12 DIAGNOSIS — Z7952 Long term (current) use of systemic steroids: Secondary | ICD-10-CM

## 2019-11-12 DIAGNOSIS — J45909 Unspecified asthma, uncomplicated: Secondary | ICD-10-CM | POA: Diagnosis present

## 2019-11-12 DIAGNOSIS — Z79899 Other long term (current) drug therapy: Secondary | ICD-10-CM

## 2019-11-12 DIAGNOSIS — N179 Acute kidney failure, unspecified: Secondary | ICD-10-CM | POA: Diagnosis present

## 2019-11-12 DIAGNOSIS — E1169 Type 2 diabetes mellitus with other specified complication: Secondary | ICD-10-CM

## 2019-11-12 DIAGNOSIS — F209 Schizophrenia, unspecified: Secondary | ICD-10-CM | POA: Diagnosis present

## 2019-11-12 DIAGNOSIS — Z794 Long term (current) use of insulin: Secondary | ICD-10-CM

## 2019-11-12 DIAGNOSIS — E119 Type 2 diabetes mellitus without complications: Secondary | ICD-10-CM

## 2019-11-12 DIAGNOSIS — J40 Bronchitis, not specified as acute or chronic: Secondary | ICD-10-CM

## 2019-11-12 DIAGNOSIS — I129 Hypertensive chronic kidney disease with stage 1 through stage 4 chronic kidney disease, or unspecified chronic kidney disease: Secondary | ICD-10-CM | POA: Diagnosis present

## 2019-11-12 DIAGNOSIS — J209 Acute bronchitis, unspecified: Secondary | ICD-10-CM | POA: Diagnosis present

## 2019-11-12 DIAGNOSIS — K8681 Exocrine pancreatic insufficiency: Secondary | ICD-10-CM | POA: Diagnosis present

## 2019-11-12 DIAGNOSIS — T380X5A Adverse effect of glucocorticoids and synthetic analogues, initial encounter: Secondary | ICD-10-CM | POA: Diagnosis present

## 2019-11-12 DIAGNOSIS — Z6841 Body Mass Index (BMI) 40.0 and over, adult: Secondary | ICD-10-CM

## 2019-11-12 LAB — BASIC METABOLIC PANEL
Anion gap: 16 — ABNORMAL HIGH (ref 5–15)
BUN: 20 mg/dL (ref 6–20)
CO2: 20 mmol/L — ABNORMAL LOW (ref 22–32)
Calcium: 9.4 mg/dL (ref 8.9–10.3)
Chloride: 90 mmol/L — ABNORMAL LOW (ref 98–111)
Creatinine, Ser: 1.09 mg/dL — ABNORMAL HIGH (ref 0.44–1.00)
GFR calc Af Amer: >60 mL/min (ref >60)
GFR calc non Af Amer: 58 mL/min — ABNORMAL LOW (ref >60)
Glucose, Bld: 618 mg/dL (ref 70–99)
Potassium: 3.7 mmol/L (ref 3.5–5.1)
Sodium: 126 mmol/L — ABNORMAL LOW (ref 135–145)

## 2019-11-12 LAB — URINALYSIS, ROUTINE W REFLEX MICROSCOPIC
Bilirubin Urine: NEGATIVE
Glucose, UA: >=500 mg/dL — AB
Hgb urine dipstick: NEGATIVE
Ketones, ur: NEGATIVE mg/dL
Leukocytes,Ua: NEGATIVE
Nitrite: NEGATIVE
Protein, ur: NEGATIVE mg/dL
Specific Gravity, Urine: <1.005 — ABNORMAL LOW (ref 1.005–1.030)
pH: 6 (ref 5.0–8.0)

## 2019-11-12 LAB — CBC WITH DIFFERENTIAL/PLATELET
Abs Immature Granulocytes: 0.11 10*3/uL — ABNORMAL HIGH (ref 0.00–0.07)
Basophils Absolute: 0 10*3/uL (ref 0.0–0.1)
Basophils Relative: 0 %
Eosinophils Absolute: 0 10*3/uL (ref 0.0–0.5)
Eosinophils Relative: 0 %
HCT: 39.1 % (ref 36.0–46.0)
Hemoglobin: 11.8 g/dL — ABNORMAL LOW (ref 12.0–15.0)
Immature Granulocytes: 1 %
Lymphocytes Relative: 14 %
Lymphs Abs: 1.3 10*3/uL (ref 0.7–4.0)
MCH: 21.9 pg — ABNORMAL LOW (ref 26.0–34.0)
MCHC: 30.2 g/dL (ref 30.0–36.0)
MCV: 72.4 fL — ABNORMAL LOW (ref 80.0–100.0)
Monocytes Absolute: 0.4 10*3/uL (ref 0.1–1.0)
Monocytes Relative: 4 %
Neutro Abs: 7.5 10*3/uL (ref 1.7–7.7)
Neutrophils Relative %: 81 %
Platelets: 343 10*3/uL (ref 150–400)
RBC: 5.4 MIL/uL — ABNORMAL HIGH (ref 3.87–5.11)
RDW: 16.7 % — ABNORMAL HIGH (ref 11.5–15.5)
WBC: 9.4 10*3/uL (ref 4.0–10.5)
nRBC: 0 % (ref 0.0–0.2)

## 2019-11-12 LAB — I-STAT VENOUS BLOOD GAS, ED
Acid-base deficit: 7 mmol/L — ABNORMAL HIGH (ref 0.0–2.0)
Bicarbonate: 17.7 mmol/L — ABNORMAL LOW (ref 20.0–28.0)
Calcium, Ion: 1.18 mmol/L (ref 1.15–1.40)
HCT: 35 % — ABNORMAL LOW (ref 36.0–46.0)
Hemoglobin: 11.9 g/dL — ABNORMAL LOW (ref 12.0–15.0)
O2 Saturation: 74 %
Patient temperature: 98.6
Potassium: 3 mmol/L — ABNORMAL LOW (ref 3.5–5.1)
Sodium: 133 mmol/L — ABNORMAL LOW (ref 135–145)
TCO2: 19 mmol/L — ABNORMAL LOW (ref 22–32)
pCO2, Ven: 30.8 mmHg — ABNORMAL LOW (ref 44.0–60.0)
pH, Ven: 7.367 (ref 7.250–7.430)
pO2, Ven: 40 mmHg (ref 32.0–45.0)

## 2019-11-12 LAB — URINALYSIS, MICROSCOPIC (REFLEX): RBC / HPF: NONE SEEN RBC/hpf (ref 0–5)

## 2019-11-12 LAB — BUN+CREAT
BUN/Creatinine Ratio: 14 (ref 9–23)
BUN: 14 mg/dL (ref 6–24)
Creatinine, Ser: 0.97 mg/dL (ref 0.57–1.00)
GFR calc Af Amer: 78 mL/min/{1.73_m2} (ref >59)
GFR calc non Af Amer: 67 mL/min/{1.73_m2} (ref >59)

## 2019-11-12 LAB — COMPREHENSIVE METABOLIC PANEL
ALT: 24 U/L (ref 0–44)
AST: 27 U/L (ref 15–41)
Albumin: 4.2 g/dL (ref 3.5–5.0)
Alkaline Phosphatase: 84 U/L (ref 38–126)
Anion gap: 17 — ABNORMAL HIGH (ref 5–15)
BUN: 20 mg/dL (ref 6–20)
CO2: 20 mmol/L — ABNORMAL LOW (ref 22–32)
Calcium: 10 mg/dL (ref 8.9–10.3)
Chloride: 85 mmol/L — ABNORMAL LOW (ref 98–111)
Creatinine, Ser: 1.16 mg/dL — ABNORMAL HIGH (ref 0.44–1.00)
GFR calc Af Amer: >60 mL/min (ref >60)
GFR calc non Af Amer: 54 mL/min — ABNORMAL LOW (ref >60)
Glucose, Bld: 752 mg/dL (ref 70–99)
Potassium: 4.7 mmol/L (ref 3.5–5.1)
Sodium: 122 mmol/L — ABNORMAL LOW (ref 135–145)
Total Bilirubin: 0.9 mg/dL (ref 0.3–1.2)
Total Protein: 8.3 g/dL — ABNORMAL HIGH (ref 6.5–8.1)

## 2019-11-12 LAB — CBG MONITORING, ED
Glucose-Capillary: >600 mg/dL (ref 70–99)
Glucose-Capillary: 512 mg/dL (ref 70–99)

## 2019-11-12 LAB — SARS CORONAVIRUS 2 BY RT PCR (HOSPITAL ORDER, PERFORMED IN ~~LOC~~ HOSPITAL LAB): SARS Coronavirus 2: NEGATIVE

## 2019-11-12 IMAGING — DX DG CHEST 2V
2 series · 2 of 2 positions shown · non-contrast
Comparison: [DATE]

CLINICAL DATA: Shortness of breath for 3 hours.

EXAM:
CHEST - 2 VIEW

[chest pa]
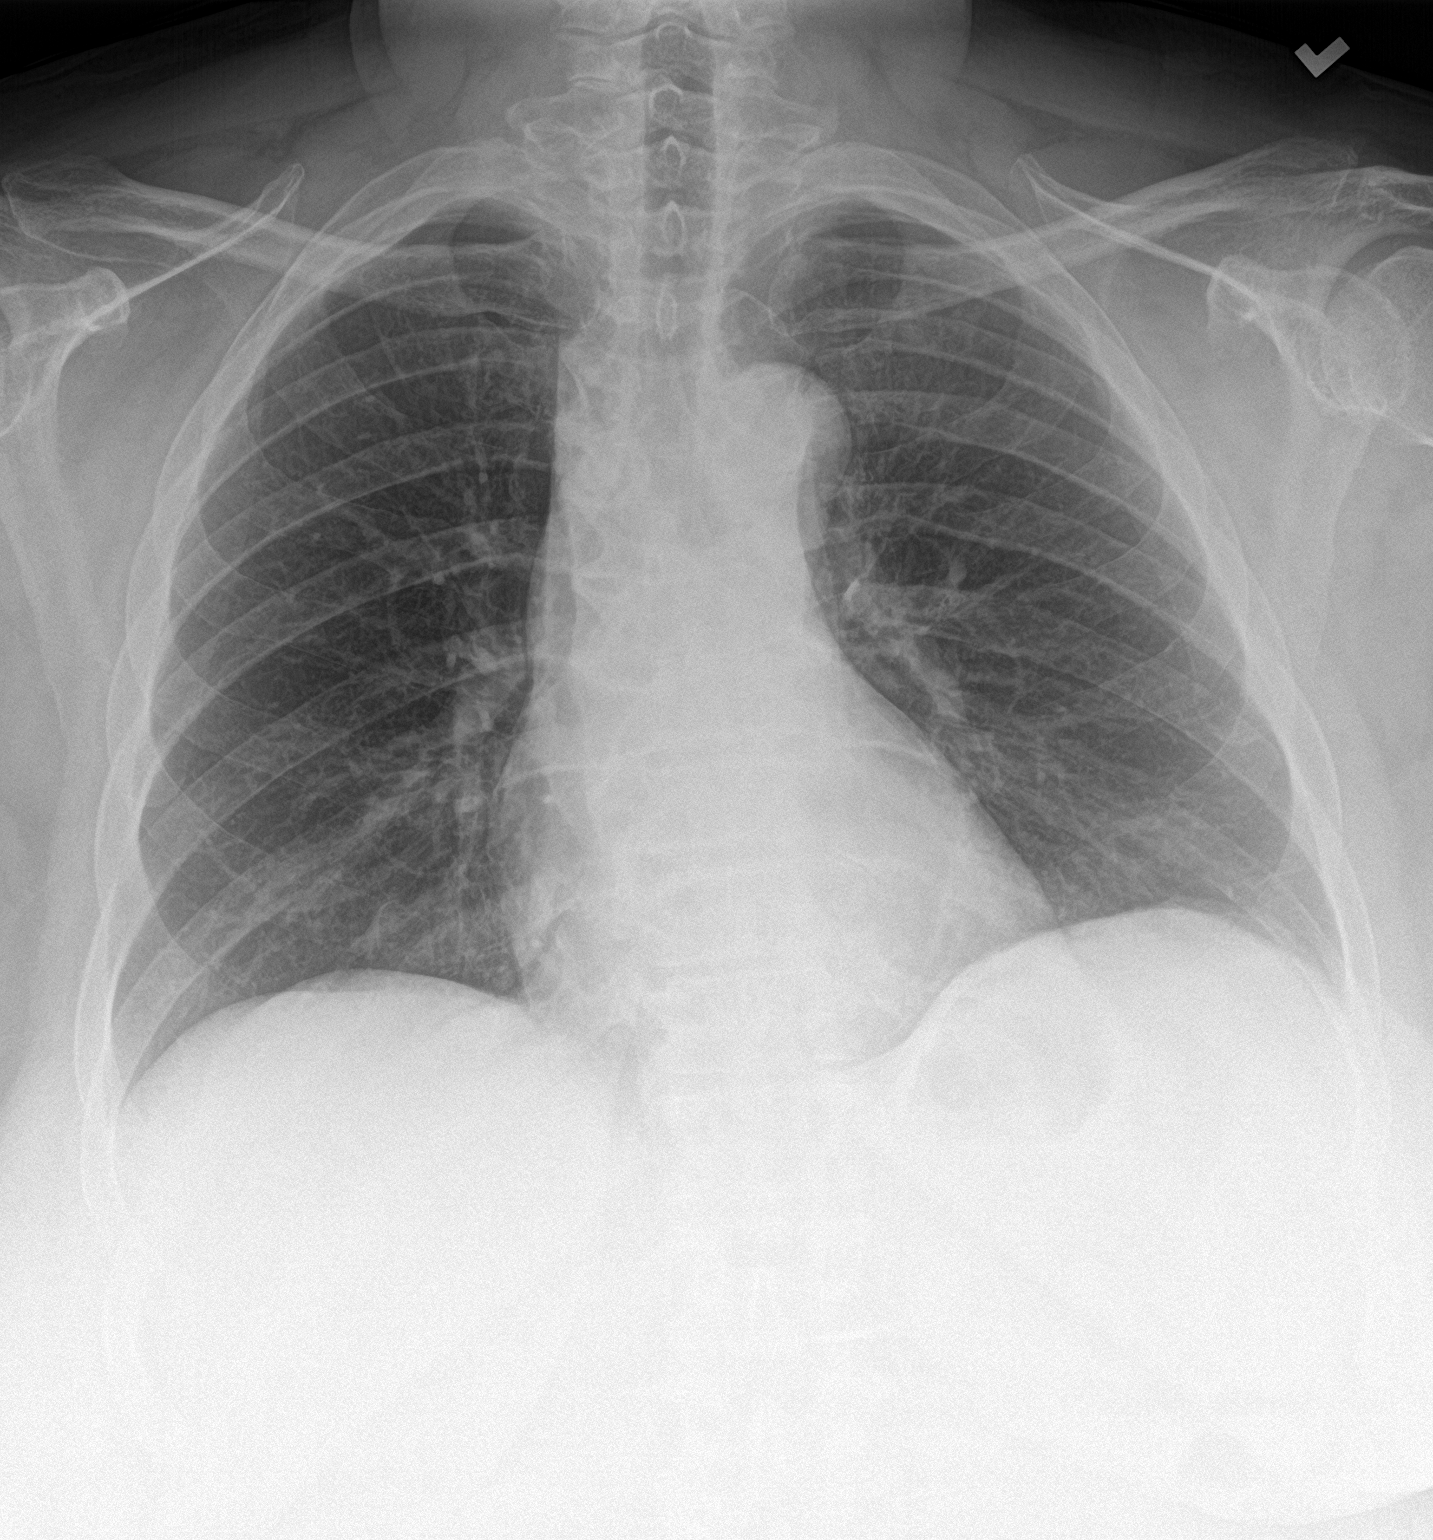

[chest lat]
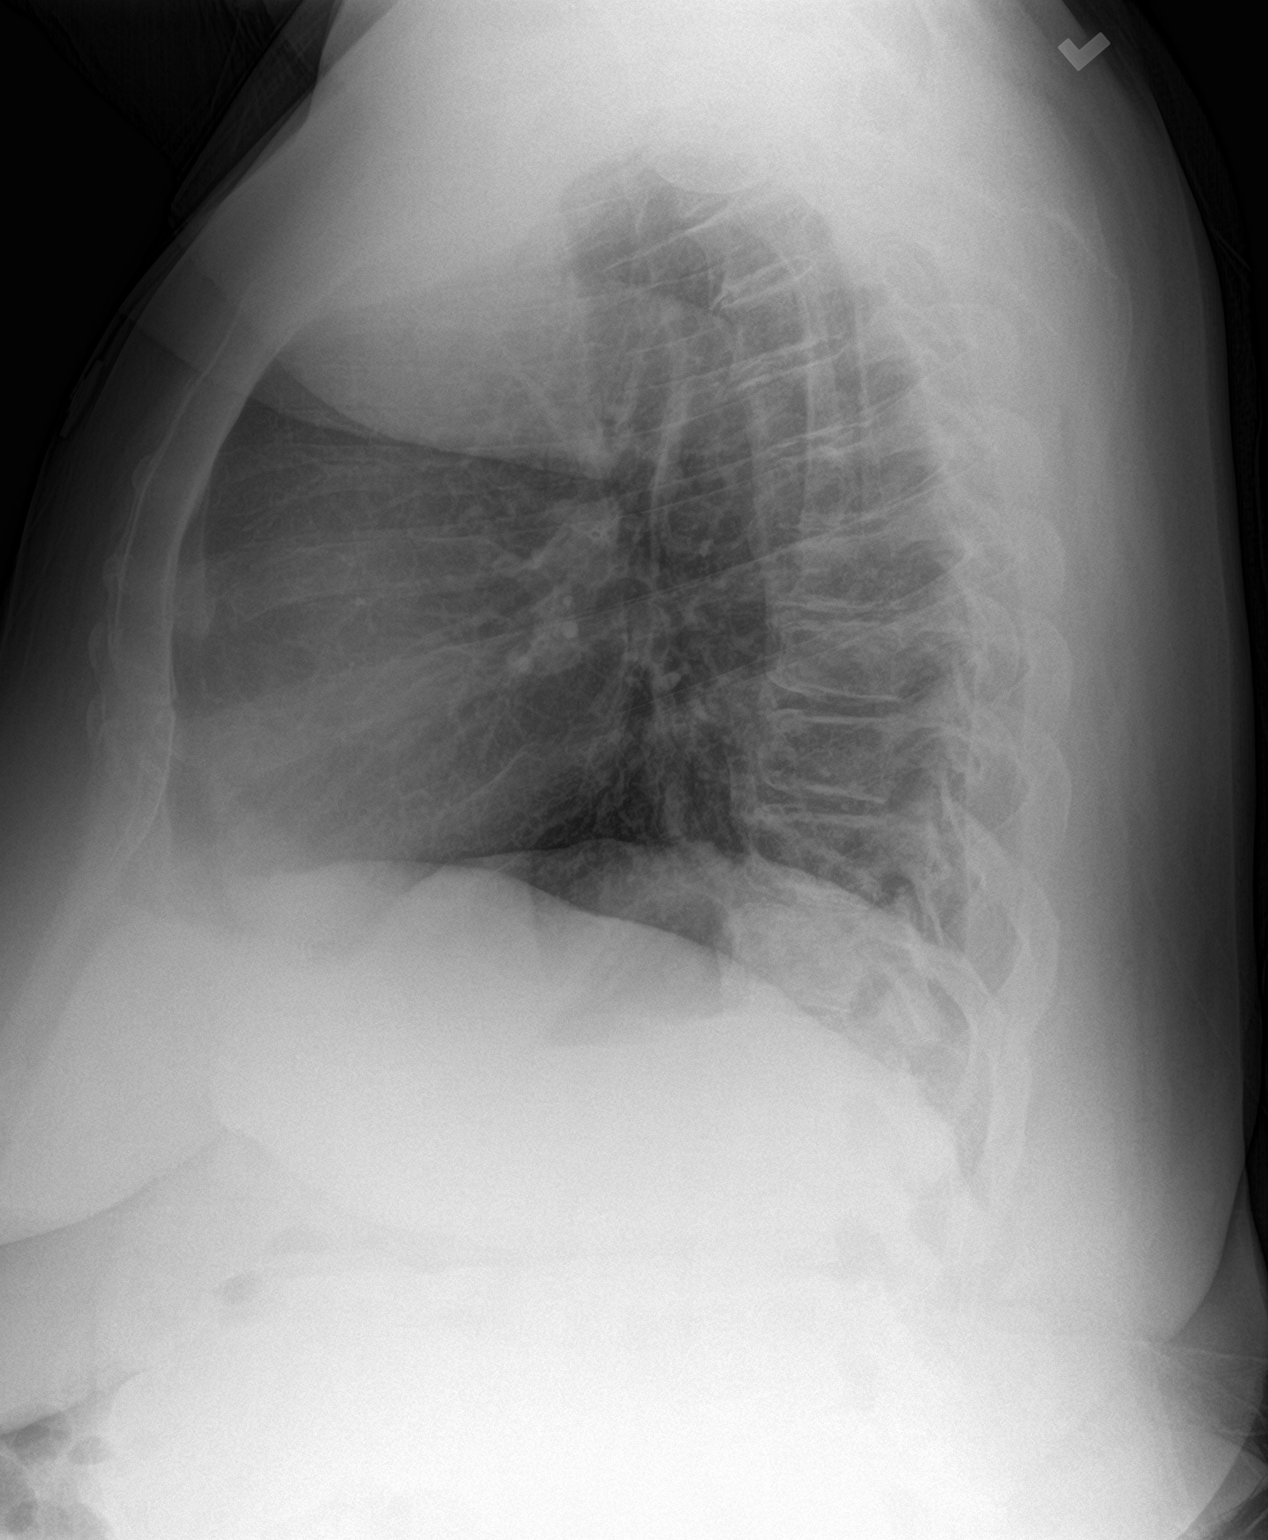

[2 of 2 positions shown; findings below may reference images not displayed]

FINDINGS: Improved lung volumes from prior exam. The cardiomediastinal
contours are normal. Resolved left lung base atelectasis from prior.
Pulmonary vasculature is normal. No consolidation, pleural effusion,
or pneumothorax. No acute osseous abnormalities are seen.
IMPRESSION: No acute chest findings. Improved lung volumes with resolved left
lung base atelectasis from recent prior.

## 2019-11-12 MED ORDER — POTASSIUM CHLORIDE 10 MEQ/100ML IV SOLN
10.0000 meq | INTRAVENOUS | Status: AC
  Administered 2019-11-12 (×2): 10 meq via INTRAVENOUS
  Filled 2019-11-12 (×2): qty 100

## 2019-11-12 MED ORDER — SODIUM CHLORIDE 0.9 % IV SOLN: INTRAVENOUS | Status: DC

## 2019-11-12 MED ORDER — IPRATROPIUM-ALBUTEROL 0.5-2.5 (3) MG/3ML IN SOLN
3.0000 mL | Freq: Once | RESPIRATORY_TRACT | Status: AC
  Administered 2019-11-12: 3 mL via RESPIRATORY_TRACT
  Filled 2019-11-12: qty 3

## 2019-11-12 MED ORDER — PREDNISONE 50 MG PO TABS
60.0000 mg | ORAL_TABLET | Freq: Once | ORAL | Status: AC
  Administered 2019-11-12: 60 mg via ORAL
  Filled 2019-11-12: qty 1

## 2019-11-12 MED ORDER — SODIUM CHLORIDE 0.9 % IV SOLN
INTRAVENOUS | Status: DC | PRN
  Administered 2019-11-12: 500 mL via INTRAVENOUS

## 2019-11-12 MED ORDER — INSULIN REGULAR(HUMAN) IN NACL 100-0.9 UT/100ML-% IV SOLN
INTRAVENOUS | Status: DC
  Administered 2019-11-12: 19 [IU]/h via INTRAVENOUS
  Filled 2019-11-12: qty 100

## 2019-11-12 MED ORDER — ALBUTEROL SULFATE HFA 108 (90 BASE) MCG/ACT IN AERS
2.0000 | INHALATION_SPRAY | Freq: Once | RESPIRATORY_TRACT | Status: AC
  Administered 2019-11-12: 2 via RESPIRATORY_TRACT
  Filled 2019-11-12: qty 6.7

## 2019-11-12 MED ORDER — SODIUM CHLORIDE 0.9 % IV BOLUS
1000.0000 mL | Freq: Once | INTRAVENOUS | Status: AC
  Administered 2019-11-12: 1000 mL via INTRAVENOUS

## 2019-11-12 MED ORDER — DEXTROSE 50 % IV SOLN: 0.0000 mL | INTRAVENOUS | Status: DC | PRN

## 2019-11-12 MED ORDER — DEXTROSE-NACL 5-0.45 % IV SOLN: INTRAVENOUS | Status: DC

## 2019-11-12 NOTE — ED Notes (Signed)
Date and time results received: 11/12/19 now  (use smartphrase ".now" to insert current time)  Test: glucose Critical Value: 618  Name of Provider Notified: Maree Krabbe PA  Orders Received? Or Actions Taken?: See MD orders

## 2019-11-12 NOTE — ED Provider Notes (Signed)
[facility] EMERGENCY DEPARTMENT Provider Note   CSN: [id number] Arrival date & time: 11/12/19  1827     History Chief Complaint  Patient presents with  . Shortness of Breath    Monique Holland is a 54 y.o. female.  HPI   Pt is a 54 y/o F with a h/o anxiety, bipolar disorder, depression, diabetes, GERD, OSA, chronic pancreatitis, schizophrenia, nephrolithiasis, who presents to the ED today for eval of SOB and cough that started today. Denies fevers. Denies chest pain or pain with inspiration. She has tried using an albuterol inhaler 3 times without relief of sxs. She also reports rhinorrhea, congestion. States sxs started today. States BS have been high and she is feeling faint.   Swelling in legs started yesterday. Has been admitted multiple times this year. Recent pancreatic surgery. Denies leg pain, hemoptysis, recent long travel, hormone use, personal hx of cancer, or hx of DVT/PE.   She was seen for bronchitis 11/03/2019 and was given rx for prednisone and sxs improved temporarily.   Past Medical History:  Diagnosis Date  . Anxiety   . Asthma   . Bipolar disorder (HCC)   . Depression   . Diabetes mellitus without complication (HCC)   . Frequency of urination   . GERD (gastroesophageal reflux disease)   . History of asthma    last episode yrs ago  . Hypertension   . OSA (obstructive sleep apnea)    pt had study done oct 2014--  schedule for cpap titrate after kidney stone surgery  . Pancreatitis   . Pseudocyst of pancreas   . Schizophrenia (HCC)   . Ureteral calculi    BILATERAL  . Wears glasses     Patient Active Problem List   Diagnosis Date Noted  . Prolonged QT interval 10/17/2019  . Pancreatitis 10/16/2019  . Hyperlipidemia 10/01/2019  . OSA (obstructive sleep apnea) 10/01/2019  . GERD (gastroesophageal reflux disease) 10/01/2019  . Right sided weakness 09/29/2019  . Stroke-like episode s/p tPA 09/29/2019  . History of pancreatitis   .  Intractable abdominal pain 09/15/2019  . Hyperosmolar hyperglycemic state (HHS) (HCC) 09/15/2019  . Non-intractable vomiting   . Hematemesis with nausea   . Abdominal pain 08/03/2019  . Essential hypertension 08/03/2019  . Pancreatic pseudocyst   . Nausea and vomiting in adult   . Necrotizing pancreatitis   . Gastritis and gastroduodenitis   . Candida esophagitis (HCC)   . Acute pancreatitis 07/17/2019  . Schizophrenia (HCC) 09/02/2018  . Obesity, Class III, BMI 40-49.9 (morbid obesity) (HCC) 09/02/2018  . Iron deficiency 09/02/2018  . Symptomatic anemia 09/01/2018  . Hypokalemia 09/01/2018  . Severe sepsis (HCC) 08/11/2018  . AKI (acute kidney injury) (HCC) 08/11/2018  . Hypotension 08/11/2018  . Type 2 diabetes mellitus (HCC) 08/11/2018  . Acute appendicitis 05/22/2014  . Ureteral calculus, right 05/02/2013    Past Surgical History:  Procedure Laterality Date  . BALLOON DILATION N/A 08/23/2019   Procedure: BALLOON DILATION;  Surgeon: Meridee ScoreMansouraty, Netty StarringGabriel Jr., MD;  [facility];  Service: Gastroenterology;  Laterality: N/A;  . BIOPSY  07/18/2019   Procedure: BIOPSY;  Surgeon: Shellia Cleverlyirigliano, Vito V, DO;  Location: [facility];  Service: Gastroenterology;;  . BIOPSY  08/23/2019   Procedure: BIOPSY;  Surgeon: Lemar LoftyMansouraty, Gabriel Jr., MD;  [facility];  Service: Gastroenterology;;  . BIOPSY  09/18/2019   Procedure: BIOPSY;  Surgeon: Lemar LoftyMansouraty, Gabriel Jr., MD;  [facility];  Service: Gastroenterology;;  . BIOPSY  10/18/2019   Procedure:  BIOPSY;  Surgeon: Lemar Lofty., MD;  [facility];  Service: Gastroenterology;;  . CESAREAN SECTION  1991  &  2002   w/ bilateral tubal ligation in 2002  . CYSTOSCOPY W/ URETERAL STENT PLACEMENT Right 08/10/2018   Procedure: CYSTOSCOPY WITH RETROGRADE PYELOGRAM/URETERAL STENT PLACEMENT;  Surgeon: Jerilee Field, MD;  Location: [facility];  Service: Urology;  Laterality: Right;  . CYSTOSCOPY WITH  RETROGRADE PYELOGRAM, URETEROSCOPY AND STENT PLACEMENT Bilateral 05/02/2013   Procedure: CYSTOSCOPY WITH RETROGRADE PYELOGRAM, URETEROSCOPY ;  Surgeon: Valetta Fuller, MD;  Location: [facility];  Service: Urology;  Laterality: Bilateral;  . CYSTOSCOPY WITH RETROGRADE PYELOGRAM, URETEROSCOPY AND STENT PLACEMENT Right 08/15/2018   Procedure: CYSTOSCOPY WITH RIGHT RETROGRADE PYELOGRAM, RIGHT URETEROSCOPY WITH HOLMIUM LASER AND STENT PLACEMENT;  Surgeon: Crista Elliot, MD;  [facility]: [facility];  Service: Urology;  Laterality: Right;  . CYSTOSCOPY WITH STENT PLACEMENT Left 05/02/2013   Procedure: CYSTOSCOPY WITH STENT PLACEMENT;  Surgeon: Valetta Fuller, MD;  Location: [facility];  Service: Urology;  Laterality: Left;  . ENDOROTOR N/A 08/26/2019   Procedure: [id number];  Surgeon: Mansouraty, Netty Starring., MD;  Location: [facility];  Service: Gastroenterology;  Laterality: N/A;  . ENDOROTOR  09/05/2019   Procedure: [id number];  Surgeon: Mansouraty, Netty Starring., MD;  Location: [facility] END[facility]COPY;  Service: Gastroenterology;;  . ESOPHAGOGASTRODUODENOSCOPY N/A 08/26/2019   Procedure: ESOPHAGOGASTRODUODENOSCOPY (EGD) with NECROSECTOMY;  Surgeon: Lemar Lofty., MD;  Location: [facility]COPY;  Service: Gastroenterology;  Laterality: N/A;  . ESOPHAGOGASTRODUODENOSCOPY N/A 10/18/2019   Procedure: ESOPHAGOGASTRODUODENOSCOPY (EGD);  Surgeon: Lemar Lofty., MD;  [facility];  Service: Gastroenterology;  Laterality: N/A;  . ESOPHAGOGASTRODUODENOSCOPY (EGD) WITH PROPOFOL N/A 07/18/2019   Procedure: ESOPHAGOGASTRODUODENOSCOPY (EGD) WITH PROPOFOL;  Surgeon: Shellia Cleverly, DO;  [facility];  Service: Gastroenterology;  Laterality: N/A;  . ESOPHAGOGASTRODUODENOSCOPY (EGD) WITH PROPOFOL N/A 08/18/2019   Procedure: ESOPHAGOGASTRODUODENOSCOPY (EGD) WITH PROPOFOL;  Surgeon: Hilarie Fredrickson, MD;  [facility];  Service: Endoscopy;  Laterality: N/A;  .  ESOPHAGOGASTRODUODENOSCOPY (EGD) WITH PROPOFOL N/A 08/21/2019   Procedure: ESOPHAGOGASTRODUODENOSCOPY (EGD) WITH PROPOFOL;  Surgeon: Meridee Score Netty Starring., MD;  [facility];  Service: Gastroenterology;  Laterality: N/A;  . ESOPHAGOGASTRODUODENOSCOPY (EGD) WITH PROPOFOL N/A 08/23/2019   Procedure: ESOPHAGOGASTRODUODENOSCOPY (EGD) WITH PROPOFOL;  Surgeon: Meridee Score Netty Starring., MD;  [facility];  Service: Gastroenterology;  Laterality: N/A;  . ESOPHAGOGASTRODUODENOSCOPY (EGD) WITH PROPOFOL N/A 09/05/2019   Procedure: ESOPHAGOGASTRODUODENOSCOPY (EGD) WITH PROPOFOL;  Surgeon: Meridee Score Netty Starring., MD;  Location: [facility] ENDOSCOPY;  Service: Gastroenterology;  Laterality: N/A;   With pancreatic pseudocyst necrosectomy via established cyst gastrostomy  . ESOPHAGOGASTRODUODENOSCOPY (EGD) WITH PROPOFOL N/A 09/18/2019   Procedure: ESOPHAGOGASTRODUODENOSCOPY (EGD) WITH PROPOFOL;  Surgeon: Meridee Score Netty Starring., MD;  Location[facility];  Service: Gastroenterology;  Laterality: N/A;  . EUS  08/23/2019   Procedure: UPPER ENDOSCOPIC ULTRASOUND (EUS) LINEAR;  Surgeon: Lemar Lofty., MD;  Location[facility];  Service: Gastroenterology;;  . FOREIGN BODY REMOVAL  09/05/2019   Procedure: FOREIGN BODY REMOVAL;  Surgeon: Lemar Lofty., MD;  Location: [facility] ENDOSCOPY;  Service: Gastroenterology;;  . FOREIGN BODY REMOVAL  09/18/2019   Procedure: FOREIGN BODY REMOVAL;  Surgeon: Lemar Lofty., MD;  Location[facility];  Service: Gastroenterology;;  . HOLMIUM LASER APPLICATION Bilateral 05/02/2013   Procedure: HOLMIUM LASER APPLICATION;  Surgeon: Valetta Fuller, MD;  Location[facility];  Service: Urology;  Laterality: Bilateral;  . LAPAROSCOPIC APPENDECTOMY N/A 05/22/2014   Procedure: APPENDECTOMY  LAPAROSCOPIC;  Surgeon: Glenna Fellows, MD;  Location: [facility];  Service: General;  Laterality: N/A;  . PANCREATIC STENT PLACEMENT  08/23/2019   Procedure:  PANCREATIC STENT PLACEMENT;  Surgeon: Lemar Lofty., MD;  Location[facility];  Service: Gastroenterology;;  . PANCREATIC STENT PLACEMENT  09/05/2019   Procedure: PANCREATIC STENT PLACEMENT;  Surgeon: Lemar Lofty., MD;  Location: [facility] END[facility]COPY;  Service: Gastroenterology;;  . PANCREATIC STENT PLACEMENT  09/18/2019   Procedure: PANCREATIC STENT PLACEMENT;  Surgeon: Lemar Lofty., MD;  [facility];  Service: Gastroenterology;;  cyst gastrostomy double pigtail stent placement   . STENT REMOVAL  08/26/2019   Procedure: STENT REMOVAL;  Surgeon: Lemar Lofty., MD;  Location: [facility] ENDOSCOPY;  Service: Gastroenterology;;  . Francine Graven REMOVAL  09/05/2019   Procedure: STENT REMOVAL;  Surgeon: Lemar Lofty., MD;  Location: [facility] ENDOSCOPY;  Service: Gastroenterology;;  . Francine Graven REMOVAL  09/18/2019   Procedure: STENT REMOVAL;  Surgeon: Lemar Lofty., MD;  Location[facility];  Service: Gastroenterology;;  . TONSILLECTOMY  age 20  . UPPER ESOPHAGEAL ENDOSCOPIC ULTRASOUND (EUS) N/A 08/21/2019   Procedure: UPPER ESOPHAGEAL ENDOSCOPIC ULTRASOUND (EUS);  Surgeon: Lemar Lofty., MD;  Location[facility];  Service: Gastroenterology;  Laterality: N/A;  For cyst gastrostomy     OB History   No obstetric history on file.     Family History  Family history unknown: Yes    Social History   Tobacco Use  . Smoking status: Never Smoker  . Smokeless tobacco: Never Used  Vaping Use  . Vaping Use: Never used  Substance Use Topics  . Alcohol use: No  . Drug use: No    Home Medications Prior to Admission medications   Medication Sig Start Date End Date Taking? Authorizing Provider  albuterol (VENTOLIN HFA) 108 (90 Base) MCG/ACT inhaler Inhale 1-2 puffs into the lungs every 6 (six) hours as needed for wheezing or shortness of breath. 11/04/19   Rancour, Jeannett Senior, MD  amitriptyline (ELAVIL) 75 MG tablet Take 75 mg by mouth at bedtime.  08/10/19   [provider]  amLODipine (NORVASC) 5 MG tablet Take 5 mg by mouth daily.    [provider]  atorvastatin (LIPITOR) 40 MG tablet Take 40 mg by mouth daily.    [provider]  clonazePAM (KLONOPIN) 0.5 MG tablet Take 0.5 mg by mouth daily.  05/23/19   [provider]  doxycycline (VIBRAMYCIN) 100 MG capsule Take 1 capsule (100 mg total) by mouth 2 (two) times daily. 11/04/19   Rancour, Jeannett Senior, MD  JANUMET 50-1000 MG tablet Take 1 tablet by mouth 2 (two) times daily. 09/24/19   [provider]  lamoTRIgine (LAMICTAL) 100 MG tablet Take 100 mg by mouth daily. 09/22/19   [provider]  LANTUS SOLOSTAR 100 UNIT/ML Solostar Pen Inject 40 Units into the skin at bedtime. Patient taking differently: Inject 50 Units into the skin at bedtime.  10/01/19   Layne Benton, NP  LATUDA 60 MG TABS Take 1 tablet by mouth at bedtime. 08/10/19   [provider]  losartan (COZAAR) 25 MG tablet Take 25 mg by mouth daily.    [provider]  ondansetron (ZOFRAN) 8 MG tablet Take 1 tablet (8 mg total) by mouth every 8 (eight) hours as needed for nausea or vomiting. 08/19/19   Regalado, Belkys A, MD  Oxycodone HCl 10 MG TABS Take 10 mg by mouth every 4 (four) hours as needed (pain).  08/12/19   [provider]  pantoprazole (PROTONIX) 40 MG tablet TAKE 1 TABLET (40 MG TOTAL) BY MOUTH 2 (TWO) TIMES DAILY FOR 28 DAYS. 11/09/19 12/07/19  Reva Bores, MD  predniSONE (DELTASONE) 50 MG tablet 1 tablet PO daily 11/04/19   Rancour, Jeannett Senior, MD  propranolol (INDERAL) 20 MG tablet Take 20 mg by mouth 2 (two) times daily.    [provider]  risperiDONE (RISPERDAL) 0.5 MG tablet Take 0.5 mg by mouth 2 (two) times daily. 09/22/19   [provider]  traZODone (DESYREL) 100 MG tablet Take 100 mg by mouth at bedtime.    [provider]    Allergies    Patient has no known allergies.  Review of Systems   Review of  Systems  Constitutional: Negative for chills and fever.  HENT: Positive for congestion and rhinorrhea. Negative for ear pain and sore throat.   Eyes: Negative for pain and visual disturbance.  Respiratory: Positive for cough, shortness of breath and wheezing.   Cardiovascular: Negative for chest pain.  Gastrointestinal: Positive for abdominal pain (chronic, unchanged). Negative for constipation, diarrhea, nausea and vomiting.  Genitourinary: Negative for dysuria and hematuria.  Musculoskeletal: Negative for back pain.  Skin: Negative for rash.  Neurological: Positive for light-headedness. Negative for headaches.  All other systems reviewed and are negative.   Physical Exam Updated Vital Signs BP (!) 127/93 (BP Location: Left Arm)   Pulse (!) 118   Temp 98.6 F (37 C) (Oral)   Resp 20   Ht 5\' 8"  (1.727 m)   Wt 123.4 kg   LMP 03/23/2014 Comment: (-) u preg?/ac  SpO2 100%   BMI 41.36 kg/m   Physical Exam Vitals and nursing note reviewed.  Constitutional:      General: She is not in acute distress.    Appearance: She is well-developed.  HENT:     Head: Normocephalic and atraumatic.  Eyes:     Conjunctiva/sclera: Conjunctivae normal.  Cardiovascular:     Rate and Rhythm: Regular rhythm. Tachycardia present.     Heart sounds: Normal heart sounds. No murmur heard.   Pulmonary:     Effort: Pulmonary effort is normal. Tachypnea present. No respiratory distress.     Breath sounds: Wheezing present.  Abdominal:     Palpations: Abdomen is soft.     Tenderness: There is no abdominal tenderness.  Musculoskeletal:     Cervical back: Neck supple.  Skin:    General: Skin is warm and dry.  Neurological:     Mental Status: She is alert.     ED Results / Procedures / Treatments   Labs (all labs ordered are listed, but only abnormal results are displayed) Labs Reviewed  CBC WITH DIFFERENTIAL/PLATELET - Abnormal; Notable for the following components:      Result Value   RBC  5.40 (*)    Hemoglobin 11.8 (*)    MCV 72.4 (*)    MCH 21.9 (*)    RDW 16.7 (*)    Abs Immature Granulocytes 0.11 (*)    All other components within normal limits  COMPREHENSIVE METABOLIC PANEL - Abnormal; Notable for the following components:   Sodium 122 (*)    Chloride 85 (*)    CO2 20 (*)    Glucose, Bld 752 (*)    Creatinine, Ser 1.16 (*)    Total Protein 8.3 (*)    GFR calc non Af Amer 54 (*)    Anion gap 17 (*)    All other components within normal  limits  URINALYSIS, ROUTINE W REFLEX MICROSCOPIC - Abnormal; Notable for the following components:   Specific Gravity, Urine <1.005 (*)    Glucose, UA >=500 (*)    All other components within normal limits  URINALYSIS, MICROSCOPIC (REFLEX) - Abnormal; Notable for the following components:   Bacteria, UA RARE (*)    All other components within normal limits  CBG MONITORING, ED - Abnormal; Notable for the following components:   Glucose-Capillary >600 (*)    All other components within normal limits  I-STAT VENOUS BLOOD GAS, ED - Abnormal; Notable for the following components:   pCO2, Ven 30.8 (*)    Bicarbonate 17.7 (*)    TCO2 19 (*)    Acid-base deficit 7.0 (*)    Sodium 133 (*)    Potassium 3.0 (*)    HCT 35.0 (*)    Hemoglobin 11.9 (*)    All other components within normal limits  SARS CORONAVIRUS 2 BY RT PCR (HOSPITAL ORDER, PERFORMED IN [facility])  BLOOD GAS, VENOUS  BASIC METABOLIC PANEL  CBG MONITORING, ED    EKG None  Radiology DG Chest 2 View  Result Date: 11/12/2019 CLINICAL DATA:  Shortness of breath for 3 hours. EXAM: CHEST - 2 VIEW COMPARISON:  11/03/2019 FINDINGS: Improved lung volumes from prior exam. The cardiomediastinal contours are normal. Resolved left lung base atelectasis from prior. Pulmonary vasculature is normal. No consolidation, pleural effusion, or pneumothorax. No acute osseous abnormalities are seen. IMPRESSION: No acute chest findings. Improved lung volumes with resolved  left lung base atelectasis from recent prior. Electronically Signed   By: Narda Rutherford M.D.   On: 11/12/2019 19:24    Procedures Procedures (including critical care time)  CRITICAL CARE Performed by: Karrie Meres   Total critical care time: 35 minutes  Critical care time was exclusive of separately billable procedures and treating other patients.  Critical care was necessary to treat or prevent imminent or life-threatening deterioration.  Critical care was time spent personally by me on the following activities: development of treatment plan with patient and/or surrogate as well as nursing, discussions with consultants, evaluation of patient's response to treatment, examination of patient, obtaining history from patient or surrogate, ordering and performing treatments and interventions, ordering and review of laboratory studies, ordering and review of radiographic studies, pulse oximetry and re-evaluation of patient's condition.   Medications Ordered in ED Medications  insulin regular, human (MYXREDLIN) 100 units/ 100 mL infusion (has no administration in time range)  0.9 %  sodium chloride infusion ( Intravenous New Bag/Given 11/12/19 2207)  dextrose 5 %-0.45 % sodium chloride infusion (has no administration in time range)  dextrose 50 % solution 0-50 mL (has no administration in time range)  potassium chloride 10 mEq in 100 mL IVPB (10 mEq Intravenous New Bag/Given 11/12/19 2210)  ipratropium-albuterol (DUONEB) 0.5-2.5 (3) MG/3ML nebulizer solution 3 mL (has no administration in time range)  albuterol (VENTOLIN HFA) 108 (90 Base) MCG/ACT inhaler 2 puff (2 puffs Inhalation Given 11/12/19 2140)  predniSONE (DELTASONE) tablet 60 mg (60 mg Oral Given 11/12/19 2059)  sodium chloride 0.9 % bolus 1,000 mL (0 mLs Intravenous Stopped 11/12/19 2205)    ED Course  I have reviewed the triage vital signs and the nursing notes.  Pertinent labs & imaging results that were available during my  care of the patient were reviewed by me and considered in my medical decision making (see chart for details).    MDM Rules/Calculators/A&P  54 year old female presenting for evaluation of URI symptoms starting earlier today.  Also with elevated blood sugars.  On arrival patient tachycardic but remainder vital signs are reassuring.  Reviewed/interpreted lab CBC without leukocytosis, anemia present but improved from prior CMP with elevated blood glucose at 752, low bicarb at 20 and elevated anion gap at 17.  Creatinine elevated at 1.16 up from yesterday.  Sodium low at 122 likely pseudohyponatremia VBG with normal pH but low bicarb.  Potassium is at 3  -Unclear if this is true potassium therefore will repeat to bmet UA with glucosuria but no ketonuria COVID pending on admission.   EKG with sinus tach, otherwise reassuring  CXR with no acute chest findings. Improved lung volumes with resolved left lung base atelectasis from recent prior.  Patient with poorly controlled diabetes presenting for evaluation of elevated blood glucose and URI symptoms.  Recently diagnosed with bronchitis and discharged on steroids, had improvement for some time but symptoms recurred today.  Patient with significant wheezing on exam, likely persistent bronchitis.  She will require course of steroids.  Her blood sugar is significantly elevated as well today she appears to be developing DKA with low bicarb and elevated anion gap with blood sugar in the 700s.  Will admit for further treatment.  10:01 PM CONSULT with Dr. Alcario Drought who accepts patient for admission.   There are currently no step[facility] beds at [facility] and it will be at least 24 hours before she can get a bed. Will plan for ED to ED transfer.   10:07 PM discussed case with Dr. Kathrynn Humble who accepts patient for transfer to [facility].   Final Clinical Impression(s) / ED Diagnoses Final diagnoses:  Hyperglycemia  Bronchitis     Rx / DC Orders ED Discharge Orders    None       Bishop Dublin 11/12/19 2219    Virgel Manifold, MD 11/13/19 [phone]

## 2019-11-12 NOTE — ED Triage Notes (Signed)
Pt c/o SOB x 3 hrs. Seen here 6/13 with same , pt states glucose reads " high  ' on home monitor

## 2019-11-12 NOTE — ED Notes (Signed)
Date and time results received: 11/12/19 2016 (use smartphrase ".now" to insert current time)  Test: glucose  Critical Value: 752  Name of Provider Notified: Dr. Juleen China  Orders Received? Or Actions Taken?: awaiting new orders

## 2019-11-12 NOTE — Progress Notes (Signed)
Patient stated she was breathing better after albuterol MDI treatment.

## 2019-11-13 DIAGNOSIS — E1165 Type 2 diabetes mellitus with hyperglycemia: Secondary | ICD-10-CM | POA: Diagnosis not present

## 2019-11-13 DIAGNOSIS — K861 Other chronic pancreatitis: Secondary | ICD-10-CM | POA: Diagnosis present

## 2019-11-13 DIAGNOSIS — Z794 Long term (current) use of insulin: Secondary | ICD-10-CM | POA: Diagnosis not present

## 2019-11-13 DIAGNOSIS — N179 Acute kidney failure, unspecified: Secondary | ICD-10-CM | POA: Diagnosis present

## 2019-11-13 DIAGNOSIS — F209 Schizophrenia, unspecified: Secondary | ICD-10-CM | POA: Diagnosis present

## 2019-11-13 DIAGNOSIS — R739 Hyperglycemia, unspecified: Secondary | ICD-10-CM | POA: Diagnosis not present

## 2019-11-13 DIAGNOSIS — I959 Hypotension, unspecified: Secondary | ICD-10-CM | POA: Diagnosis not present

## 2019-11-13 DIAGNOSIS — E785 Hyperlipidemia, unspecified: Secondary | ICD-10-CM | POA: Diagnosis present

## 2019-11-13 DIAGNOSIS — K8681 Exocrine pancreatic insufficiency: Secondary | ICD-10-CM | POA: Diagnosis present

## 2019-11-13 DIAGNOSIS — G4733 Obstructive sleep apnea (adult) (pediatric): Secondary | ICD-10-CM | POA: Diagnosis present

## 2019-11-13 DIAGNOSIS — J209 Acute bronchitis, unspecified: Secondary | ICD-10-CM | POA: Diagnosis present

## 2019-11-13 DIAGNOSIS — Z87442 Personal history of urinary calculi: Secondary | ICD-10-CM | POA: Diagnosis not present

## 2019-11-13 DIAGNOSIS — E1110 Type 2 diabetes mellitus with ketoacidosis without coma: Secondary | ICD-10-CM | POA: Diagnosis present

## 2019-11-13 DIAGNOSIS — E1122 Type 2 diabetes mellitus with diabetic chronic kidney disease: Secondary | ICD-10-CM | POA: Diagnosis present

## 2019-11-13 DIAGNOSIS — T380X5A Adverse effect of glucocorticoids and synthetic analogues, initial encounter: Secondary | ICD-10-CM | POA: Diagnosis present

## 2019-11-13 DIAGNOSIS — J45909 Unspecified asthma, uncomplicated: Secondary | ICD-10-CM | POA: Diagnosis present

## 2019-11-13 DIAGNOSIS — I9589 Other hypotension: Secondary | ICD-10-CM | POA: Diagnosis not present

## 2019-11-13 DIAGNOSIS — K219 Gastro-esophageal reflux disease without esophagitis: Secondary | ICD-10-CM | POA: Diagnosis present

## 2019-11-13 DIAGNOSIS — Z7952 Long term (current) use of systemic steroids: Secondary | ICD-10-CM | POA: Diagnosis not present

## 2019-11-13 DIAGNOSIS — I129 Hypertensive chronic kidney disease with stage 1 through stage 4 chronic kidney disease, or unspecified chronic kidney disease: Secondary | ICD-10-CM | POA: Diagnosis present

## 2019-11-13 DIAGNOSIS — Z20822 Contact with and (suspected) exposure to covid-19: Secondary | ICD-10-CM | POA: Diagnosis present

## 2019-11-13 DIAGNOSIS — F319 Bipolar disorder, unspecified: Secondary | ICD-10-CM | POA: Diagnosis present

## 2019-11-13 DIAGNOSIS — Z79899 Other long term (current) drug therapy: Secondary | ICD-10-CM | POA: Diagnosis not present

## 2019-11-13 DIAGNOSIS — N182 Chronic kidney disease, stage 2 (mild): Secondary | ICD-10-CM | POA: Diagnosis present

## 2019-11-13 DIAGNOSIS — E1169 Type 2 diabetes mellitus with other specified complication: Secondary | ICD-10-CM

## 2019-11-13 DIAGNOSIS — Z6841 Body Mass Index (BMI) 40.0 and over, adult: Secondary | ICD-10-CM | POA: Diagnosis not present

## 2019-11-13 LAB — BASIC METABOLIC PANEL
Anion gap: 12 (ref 5–15)
Anion gap: 11 (ref 5–15)
Anion gap: 12 (ref 5–15)
Anion gap: 13 (ref 5–15)
BUN: 15 mg/dL (ref 6–20)
BUN: 13 mg/dL (ref 6–20)
BUN: 13 mg/dL (ref 6–20)
BUN: 19 mg/dL (ref 6–20)
CO2: 22 mmol/L (ref 22–32)
CO2: 23 mmol/L (ref 22–32)
CO2: 23 mmol/L (ref 22–32)
CO2: 20 mmol/L — ABNORMAL LOW (ref 22–32)
Calcium: 9.9 mg/dL (ref 8.9–10.3)
Calcium: 9.4 mg/dL (ref 8.9–10.3)
Calcium: 9.4 mg/dL (ref 8.9–10.3)
Calcium: 9.3 mg/dL (ref 8.9–10.3)
Chloride: 98 mmol/L (ref 98–111)
Chloride: 98 mmol/L (ref 98–111)
Chloride: 96 mmol/L — ABNORMAL LOW (ref 98–111)
Chloride: 93 mmol/L — ABNORMAL LOW (ref 98–111)
Creatinine, Ser: 0.86 mg/dL (ref 0.44–1.00)
Creatinine, Ser: 0.77 mg/dL (ref 0.44–1.00)
Creatinine, Ser: 0.8 mg/dL (ref 0.44–1.00)
Creatinine, Ser: 1.21 mg/dL — ABNORMAL HIGH (ref 0.44–1.00)
GFR calc Af Amer: >60 mL/min (ref >60)
GFR calc Af Amer: >60 mL/min (ref >60)
GFR calc Af Amer: >60 mL/min (ref >60)
GFR calc Af Amer: 60 mL/min — ABNORMAL LOW (ref >60)
GFR calc non Af Amer: >60 mL/min (ref >60)
GFR calc non Af Amer: >60 mL/min (ref >60)
GFR calc non Af Amer: >60 mL/min (ref >60)
GFR calc non Af Amer: 51 mL/min — ABNORMAL LOW (ref >60)
Glucose, Bld: 228 mg/dL — ABNORMAL HIGH (ref 70–99)
Glucose, Bld: 254 mg/dL — ABNORMAL HIGH (ref 70–99)
Glucose, Bld: 268 mg/dL — ABNORMAL HIGH (ref 70–99)
Glucose, Bld: 521 mg/dL (ref 70–99)
Potassium: 3.9 mmol/L (ref 3.5–5.1)
Potassium: 4 mmol/L (ref 3.5–5.1)
Potassium: 4.5 mmol/L (ref 3.5–5.1)
Potassium: 4.5 mmol/L (ref 3.5–5.1)
Sodium: 132 mmol/L — ABNORMAL LOW (ref 135–145)
Sodium: 132 mmol/L — ABNORMAL LOW (ref 135–145)
Sodium: 131 mmol/L — ABNORMAL LOW (ref 135–145)
Sodium: 126 mmol/L — ABNORMAL LOW (ref 135–145)

## 2019-11-13 LAB — CBG MONITORING, ED
Glucose-Capillary: 184 mg/dL — ABNORMAL HIGH (ref 70–99)
Glucose-Capillary: 185 mg/dL — ABNORMAL HIGH (ref 70–99)
Glucose-Capillary: 199 mg/dL — ABNORMAL HIGH (ref 70–99)
Glucose-Capillary: 208 mg/dL — ABNORMAL HIGH (ref 70–99)
Glucose-Capillary: 222 mg/dL — ABNORMAL HIGH (ref 70–99)
Glucose-Capillary: 223 mg/dL — ABNORMAL HIGH (ref 70–99)
Glucose-Capillary: 257 mg/dL — ABNORMAL HIGH (ref 70–99)
Glucose-Capillary: 266 mg/dL — ABNORMAL HIGH (ref 70–99)
Glucose-Capillary: 362 mg/dL — ABNORMAL HIGH (ref 70–99)
Glucose-Capillary: 424 mg/dL — ABNORMAL HIGH (ref 70–99)
Glucose-Capillary: 458 mg/dL — ABNORMAL HIGH (ref 70–99)
Glucose-Capillary: 512 mg/dL (ref 70–99)
Glucose-Capillary: 541 mg/dL (ref 70–99)

## 2019-11-13 LAB — OSMOLALITY: Osmolality: 285 mOsm/kg (ref 275–295)

## 2019-11-13 MED ORDER — LURASIDONE HCL 40 MG PO TABS
80.0000 mg | ORAL_TABLET | Freq: Every day | ORAL | Status: DC
  Administered 2019-11-13 – 2019-11-16 (×4): 80 mg via ORAL
  Filled 2019-11-13 (×5): qty 2

## 2019-11-13 MED ORDER — TRAZODONE HCL 100 MG PO TABS
100.0000 mg | ORAL_TABLET | Freq: Every day | ORAL | Status: DC
  Administered 2019-11-13 – 2019-11-16 (×5): 100 mg via ORAL
  Filled 2019-11-13 (×2): qty 1
  Filled 2019-11-13: qty 2
  Filled 2019-11-13: qty 1
  Filled 2019-11-13: qty 2

## 2019-11-13 MED ORDER — INSULIN GLARGINE 100 UNIT/ML ~~LOC~~ SOLN
80.0000 [IU] | Freq: Every day | SUBCUTANEOUS | Status: DC
  Administered 2019-11-13 – 2019-11-14 (×2): 80 [IU] via SUBCUTANEOUS
  Filled 2019-11-13 (×2): qty 0.8

## 2019-11-13 MED ORDER — ENOXAPARIN SODIUM 40 MG/0.4ML ~~LOC~~ SOLN
40.0000 mg | Freq: Every day | SUBCUTANEOUS | Status: DC
  Administered 2019-11-13 – 2019-11-17 (×5): 40 mg via SUBCUTANEOUS
  Filled 2019-11-13 (×5): qty 0.4

## 2019-11-13 MED ORDER — INSULIN ASPART 100 UNIT/ML ~~LOC~~ SOLN
0.0000 [IU] | Freq: Three times a day (TID) | SUBCUTANEOUS | Status: DC
  Administered 2019-11-13: 7 [IU] via SUBCUTANEOUS
  Administered 2019-11-14: 4 [IU] via SUBCUTANEOUS
  Administered 2019-11-14: 7 [IU] via SUBCUTANEOUS
  Administered 2019-11-15: 11 [IU] via SUBCUTANEOUS
  Administered 2019-11-15: 7 [IU] via SUBCUTANEOUS
  Administered 2019-11-15: 4 [IU] via SUBCUTANEOUS
  Administered 2019-11-16: 3 [IU] via SUBCUTANEOUS
  Administered 2019-11-16: 11 [IU] via SUBCUTANEOUS
  Administered 2019-11-16: 4 [IU] via SUBCUTANEOUS
  Administered 2019-11-17: 3 [IU] via SUBCUTANEOUS
  Administered 2019-11-17: 4 [IU] via SUBCUTANEOUS

## 2019-11-13 MED ORDER — ATORVASTATIN CALCIUM 40 MG PO TABS
40.0000 mg | ORAL_TABLET | Freq: Every day | ORAL | Status: DC
  Administered 2019-11-13 – 2019-11-16 (×4): 40 mg via ORAL
  Filled 2019-11-13 (×4): qty 1

## 2019-11-13 MED ORDER — ALBUTEROL SULFATE (2.5 MG/3ML) 0.083% IN NEBU: 2.5000 mg | INHALATION_SOLUTION | Freq: Four times a day (QID) | RESPIRATORY_TRACT | Status: DC | PRN

## 2019-11-13 MED ORDER — TRAZODONE HCL 50 MG PO TABS: 100.0000 mg | ORAL_TABLET | Freq: Every day | ORAL | Status: DC

## 2019-11-13 MED ORDER — INSULIN ASPART 100 UNIT/ML ~~LOC~~ SOLN
25.0000 [IU] | Freq: Once | SUBCUTANEOUS | Status: AC
  Administered 2019-11-13: 25 [IU] via SUBCUTANEOUS

## 2019-11-13 MED ORDER — PANTOPRAZOLE SODIUM 40 MG PO TBEC
40.0000 mg | DELAYED_RELEASE_TABLET | Freq: Two times a day (BID) | ORAL | Status: DC
  Administered 2019-11-13 – 2019-11-17 (×9): 40 mg via ORAL
  Filled 2019-11-13 (×9): qty 1

## 2019-11-13 MED ORDER — AMLODIPINE BESYLATE 5 MG PO TABS
5.0000 mg | ORAL_TABLET | Freq: Every day | ORAL | Status: DC
  Administered 2019-11-13 – 2019-11-17 (×5): 5 mg via ORAL
  Filled 2019-11-13 (×5): qty 1

## 2019-11-13 MED ORDER — LAMOTRIGINE 100 MG PO TABS
200.0000 mg | ORAL_TABLET | Freq: Every day | ORAL | Status: DC
  Administered 2019-11-13 – 2019-11-17 (×5): 200 mg via ORAL
  Filled 2019-11-13 (×7): qty 2

## 2019-11-13 MED ORDER — INSULIN GLARGINE 100 UNIT/ML ~~LOC~~ SOLN
25.0000 [IU] | Freq: Every day | SUBCUTANEOUS | Status: AC
  Administered 2019-11-13: 25 [IU] via SUBCUTANEOUS
  Filled 2019-11-13: qty 0.25

## 2019-11-13 MED ORDER — INSULIN GLARGINE 100 UNIT/ML ~~LOC~~ SOLN
12.0000 [IU] | Freq: Every day | SUBCUTANEOUS | Status: DC
  Filled 2019-11-13: qty 0.12

## 2019-11-13 MED ORDER — CLONAZEPAM 0.5 MG PO TABS
0.5000 mg | ORAL_TABLET | Freq: Every day | ORAL | Status: DC
  Administered 2019-11-13 – 2019-11-17 (×5): 0.5 mg via ORAL
  Filled 2019-11-13 (×5): qty 1

## 2019-11-13 MED ORDER — OXYCODONE HCL 5 MG PO TABS
10.0000 mg | ORAL_TABLET | ORAL | Status: DC | PRN
  Administered 2019-11-13 – 2019-11-16 (×3): 10 mg via ORAL
  Filled 2019-11-13 (×3): qty 2

## 2019-11-13 MED ORDER — AMITRIPTYLINE HCL 50 MG PO TABS
75.0000 mg | ORAL_TABLET | Freq: Every day | ORAL | Status: DC
  Administered 2019-11-13 – 2019-11-16 (×4): 75 mg via ORAL
  Filled 2019-11-13 (×2): qty 1
  Filled 2019-11-13: qty 3
  Filled 2019-11-13: qty 1

## 2019-11-13 MED ORDER — LURASIDONE HCL 40 MG PO TABS: 80.0000 mg | ORAL_TABLET | Freq: Every day | ORAL | Status: DC

## 2019-11-13 MED ORDER — DEXTROSE-NACL 5-0.45 % IV SOLN: INTRAVENOUS | Status: DC

## 2019-11-13 MED ORDER — DEXTROSE 50 % IV SOLN: 0.0000 mL | INTRAVENOUS | Status: DC | PRN

## 2019-11-13 MED ORDER — DICLOFENAC SODIUM 75 MG PO TBEC
75.0000 mg | DELAYED_RELEASE_TABLET | Freq: Two times a day (BID) | ORAL | Status: DC
  Administered 2019-11-13 – 2019-11-17 (×9): 75 mg via ORAL
  Filled 2019-11-13 (×11): qty 1

## 2019-11-13 MED ORDER — DOXYCYCLINE HYCLATE 100 MG PO CAPS: 100.0000 mg | ORAL_CAPSULE | Freq: Two times a day (BID) | ORAL | Status: DC

## 2019-11-13 MED ORDER — RISPERIDONE 0.5 MG PO TABS
0.5000 mg | ORAL_TABLET | Freq: Every day | ORAL | Status: DC
  Administered 2019-11-13 – 2019-11-16 (×4): 0.5 mg via ORAL
  Filled 2019-11-13 (×5): qty 1

## 2019-11-13 MED ORDER — ARIPIPRAZOLE 2 MG PO TABS
2.0000 mg | ORAL_TABLET | Freq: Every day | ORAL | Status: DC
  Administered 2019-11-13 – 2019-11-17 (×5): 2 mg via ORAL
  Filled 2019-11-13 (×6): qty 1

## 2019-11-13 MED ORDER — DOXYCYCLINE HYCLATE 100 MG PO TABS
100.0000 mg | ORAL_TABLET | Freq: Two times a day (BID) | ORAL | Status: AC
  Administered 2019-11-13 (×2): 100 mg via ORAL
  Filled 2019-11-13 (×2): qty 1

## 2019-11-13 MED ORDER — PROMETHAZINE HCL 6.25 MG/5ML PO SYRP: 6.2500 mg | ORAL_SOLUTION | Freq: Two times a day (BID) | ORAL | Status: DC | PRN

## 2019-11-13 MED ORDER — PROPRANOLOL HCL 20 MG PO TABS
20.0000 mg | ORAL_TABLET | Freq: Two times a day (BID) | ORAL | Status: DC
  Administered 2019-11-13 – 2019-11-17 (×8): 20 mg via ORAL
  Filled 2019-11-13 (×11): qty 1

## 2019-11-13 MED ORDER — ONDANSETRON HCL 4 MG PO TABS
8.0000 mg | ORAL_TABLET | Freq: Three times a day (TID) | ORAL | Status: DC | PRN
  Administered 2019-11-15 – 2019-11-16 (×2): 8 mg via ORAL
  Filled 2019-11-13 (×2): qty 2

## 2019-11-13 MED ORDER — SODIUM CHLORIDE 0.9 % IV SOLN: INTRAVENOUS | Status: DC

## 2019-11-13 MED ORDER — INSULIN REGULAR(HUMAN) IN NACL 100-0.9 UT/100ML-% IV SOLN
INTRAVENOUS | Status: DC
  Administered 2019-11-13: 7 [IU]/h via INTRAVENOUS
  Filled 2019-11-13: qty 100

## 2019-11-13 MED ORDER — LACTATED RINGERS IV BOLUS
1000.0000 mL | Freq: Once | INTRAVENOUS | Status: AC
  Administered 2019-11-13: 1000 mL via INTRAVENOUS

## 2019-11-13 MED ORDER — INSULIN ASPART 100 UNIT/ML ~~LOC~~ SOLN
0.0000 [IU] | Freq: Every day | SUBCUTANEOUS | Status: DC
  Administered 2019-11-14: 3 [IU] via SUBCUTANEOUS
  Administered 2019-11-15: 2 [IU] via SUBCUTANEOUS
  Administered 2019-11-16: 3 [IU] via SUBCUTANEOUS

## 2019-11-13 MED ORDER — ARIPIPRAZOLE 2 MG PO TABS: 2.0000 mg | ORAL_TABLET | Freq: Every day | ORAL | Status: DC

## 2019-11-13 MED ORDER — RISPERIDONE 0.5 MG PO TABS: 0.5000 mg | ORAL_TABLET | Freq: Every day | ORAL | Status: DC

## 2019-11-13 MED ORDER — PREDNISONE 20 MG PO TABS
40.0000 mg | ORAL_TABLET | Freq: Every day | ORAL | Status: DC
  Administered 2019-11-13: 40 mg via ORAL
  Filled 2019-11-13: qty 2

## 2019-11-13 MED ORDER — LOSARTAN POTASSIUM 25 MG PO TABS
25.0000 mg | ORAL_TABLET | Freq: Every day | ORAL | Status: DC
  Administered 2019-11-13 – 2019-11-17 (×5): 25 mg via ORAL
  Filled 2019-11-13 (×5): qty 1

## 2019-11-13 NOTE — ED Notes (Signed)
Report given to [place], Forensic scientist at [facility] ED

## 2019-11-13 NOTE — ED Notes (Signed)
Checked patient cbg it was 51 notified RN of blood sugar patient is resting with call bell in reach

## 2019-11-13 NOTE — H&P (Signed)
History and Physical    Monique AdlerLisa Ann Templeton EAV:[id number] DOB: 08-08-1965 DOA: 11/12/2019  PCP: Knox RoyaltyJones, Enrico, MD  Patient coming from: Home  I have personally briefly reviewed patient's old medical records [facility]  Chief Complaint: SOB  HPI: Monique Holland is a 54 y.o. female with medical history significant of chronic pancreatitis, schizophrenia, BPD, DM.  Pt presents to ED for SOB that returned today.  Had been doing better after being put on steroids recently (6/13) though her BGL has been running high.  She has tried using an albuterol inhaler 3 times without relief of sxs. She also reports rhinorrhea, congestion. States sxs started today. States BS have been high and she is feeling faint.    ED Course: Given steroids and albuterol in ED with improvement to breathing; however, pt had BGL of >700 and AG of 17.  No keytones in urine however.  Started on glucostabilizer and [facility]   Review of Systems: As per HPI, otherwise all review of systems negative.  Past Medical History:  Diagnosis Date  . Anxiety   . Asthma   . Bipolar disorder (HCC)   . Depression   . Diabetes mellitus without complication (HCC)   . Frequency of urination   . GERD (gastroesophageal reflux disease)   . History of asthma    last episode yrs ago  . Hypertension   . OSA (obstructive sleep apnea)    pt had study done oct 2014--  schedule for cpap titrate after kidney stone surgery  . Pancreatitis   . Pseudocyst of pancreas   . Schizophrenia (HCC)   . Ureteral calculi    BILATERAL  . Wears glasses     Past Surgical History:  Procedure Laterality Date  . BALLOON DILATION N/A 08/23/2019   Procedure: BALLOON DILATION;  Surgeon: Meridee ScoreMansouraty, Netty StarringGabriel Jr., MD;  [facility];  Service: Gastroenterology;  Laterality: N/A;  . BIOPSY  07/18/2019   Procedure: BIOPSY;  Surgeon: Shellia Cleverlyirigliano, Vito V, DO;  [facility];  Service:  Gastroenterology;;  . BIOPSY  08/23/2019   Procedure: BIOPSY;  Surgeon: Lemar LoftyMansouraty, Gabriel Jr., MD;  [facility];  Service: Gastroenterology;;  . BIOPSY  09/18/2019   Procedure: BIOPSY;  Surgeon: Lemar LoftyMansouraty, Gabriel Jr., MD;  [facility];  Service: Gastroenterology;;  . BIOPSY  10/18/2019   Procedure: BIOPSY;  Surgeon: Lemar LoftyMansouraty, Gabriel Jr., MD;  [facility];  Service: Gastroenterology;;  . CESAREAN SECTION  1991  &  2002   w/ bilateral tubal ligation in 2002  . CYSTOSCOPY W/ URETERAL STENT PLACEMENT Right 08/10/2018   Procedure: CYSTOSCOPY WITH RETROGRADE PYELOGRAM/URETERAL STENT PLACEMENT;  Surgeon: Jerilee FieldEskridge, Matthew, MD;  [facility];  Service: Urology;  Laterality: Right;  . CYSTOSCOPY WITH RETROGRADE PYELOGRAM, URETEROSCOPY AND STENT PLACEMENT Bilateral 05/02/2013   Procedure: CYSTOSCOPY WITH RETROGRADE PYELOGRAM, URETEROSCOPY ;  Surgeon: Valetta Fulleravid S Grapey, MD;  [facility];  Service: Urology;  Laterality: Bilateral;  . CYSTOSCOPY WITH RETROGRADE PYELOGRAM, URETEROSCOPY AND STENT PLACEMENT Right 08/15/2018   Procedure: CYSTOSCOPY WITH RIGHT RETROGRADE PYELOGRAM, RIGHT URETEROSCOPY WITH HOLMIUM LASER AND STENT PLACEMENT;  Surgeon: Crista ElliotBell, Eugene D III, MD;  [facility];  Service: Urology;  Laterality: Right;  . CYSTOSCOPY WITH STENT PLACEMENT Left 05/02/2013   Procedure: CYSTOSCOPY WITH STENT PLACEMENT;  Surgeon: Valetta Fulleravid S Grapey, MD;  [facility] [place];  Service: Urology;  Laterality: Left;  . ENDOROTOR N/A 08/26/2019   Procedure: [id number];  Surgeon: Lemar Lofty., MD;  Location: [facility]COPY;  Service: Gastroenterology;  Laterality: N/A;  . ENDOROTOR  09/05/2019   Procedure: [id number];  Surgeon: Mansouraty, Netty Starring., MD;  Location: [facility] ENDOSCOPY;  Service: Gastroenterology;;  . ESOPHAGOGASTRODUODENOSCOPY N/A 08/26/2019   Procedure: ESOPHAGOGASTRODUODENOSCOPY (EGD) with NECROSECTOMY;  Surgeon: Lemar Lofty., MD;  Location: [facility];  Service: Gastroenterology;  Laterality: N/A;  . ESOPHAGOGASTRODUODENOSCOPY N/A 10/18/2019   Procedure: ESOPHAGOGASTRODUODENOSCOPY (EGD);  Surgeon: Lemar Lofty., MD;  [facility];  Service: Gastroenterology;  Laterality: N/A;  . ESOPHAGOGASTRODUODENOSCOPY (EGD) WITH PROPOFOL N/A 07/18/2019   Procedure: ESOPHAGOGASTRODUODENOSCOPY (EGD) WITH PROPOFOL;  Surgeon: Shellia Cleverly, DO;  [facility];  Service: Gastroenterology;  Laterality: N/A;  . ESOPHAGOGASTRODUODENOSCOPY (EGD) WITH PROPOFOL N/A 08/18/2019   Procedure: ESOPHAGOGASTRODUODENOSCOPY (EGD) WITH PROPOFOL;  Surgeon: Hilarie Fredrickson, MD;  [facility];  Service: Endoscopy;  Laterality: N/A;  . ESOPHAGOGASTRODUODENOSCOPY (EGD) WITH PROPOFOL N/A 08/21/2019   Procedure: ESOPHAGOGASTRODUODENOSCOPY (EGD) WITH PROPOFOL;  Surgeon: Meridee Score Netty Starring., MD;  [facility];  Service: Gastroenterology;  Laterality: N/A;  . ESOPHAGOGASTRODUODENOSCOPY (EGD) WITH PROPOFOL N/A 08/23/2019   Procedure: ESOPHAGOGASTRODUODENOSCOPY (EGD) WITH PROPOFOL;  Surgeon: Meridee Score Netty Starring., MD;  [facility];  Service: Gastroenterology;  Laterality: N/A;  . ESOPHAGOGASTRODUODENOSCOPY (EGD) WITH PROPOFOL N/A 09/05/2019   Procedure: ESOPHAGOGASTRODUODENOSCOPY (EGD) WITH PROPOFOL;  Surgeon: Meridee Score Netty Starring., MD;  Location: [facility] ENDOSCOPY;  Service: Gastroenterology;  Laterality: N/A;   With pancreatic pseudocyst necrosectomy via established cyst gastrostomy  . ESOPHAGOGASTRODUODENOSCOPY (EGD) WITH PROPOFOL N/A 09/18/2019   Procedure: ESOPHAGOGASTRODUODENOSCOPY (EGD) WITH PROPOFOL;  Surgeon: Meridee Score Netty Starring., MD;  [facility];  Service: Gastroenterology;  Laterality: N/A;  . EUS  08/23/2019   Procedure: UPPER ENDOSCOPIC ULTRASOUND (EUS) LINEAR;  Surgeon: Lemar Lofty., MD;  [facility];  Service: Gastroenterology;;  . FOREIGN BODY  REMOVAL  09/05/2019   Procedure: FOREIGN BODY REMOVAL;  Surgeon: Lemar Lofty., MD;  Location: [facility] ENDOSCOPY;  Service: Gastroenterology;;  . FOREIGN BODY REMOVAL  09/18/2019   Procedure: FOREIGN BODY REMOVAL;  Surgeon: Lemar Lofty., MD;  [facility];  Service: Gastroenterology;;  . HOLMIUM LASER APPLICATION Bilateral 05/02/2013   Procedure: HOLMIUM LASER APPLICATION;  Surgeon: Valetta Fuller, MD;  Location[facility];  Service: Urology;  Laterality: Bilateral;  . LAPAROSCOPIC APPENDECTOMY N/A 05/22/2014   Procedure: APPENDECTOMY LAPAROSCOPIC;  Surgeon: Glenna Fellows, MD;  [facility];  Service: General;  Laterality: N/A;  . PANCREATIC STENT PLACEMENT  08/23/2019   Procedure: PANCREATIC STENT PLACEMENT;  Surgeon: Lemar Lofty., MD;  [facility];  Service: Gastroenterology;;  . PANCREATIC STENT PLACEMENT  09/05/2019   Procedure: PANCREATIC STENT PLACEMENT;  Surgeon: Lemar Lofty., MD;  Location: [facility];  Service: Gastroenterology;;  . PANCREATIC STENT PLACEMENT  09/18/2019   Procedure: PANCREATIC STENT PLACEMENT;  Surgeon: Lemar Lofty., MD;  [facility];  Service: Gastroenterology;;  cyst gastrostomy double pigtail stent placement   . STENT REMOVAL  08/26/2019   Procedure: STENT REMOVAL;  Surgeon: Lemar Lofty., MD;  Location: [facility];  Service: Gastroenterology;;  . Francine Graven REMOVAL  09/05/2019   Procedure: STENT REMOVAL;  Surgeon: Lemar Lofty., MD;  Location: [facility] ENDOSCOPY;  Service: Gastroenterology;;  . Francine Graven REMOVAL  09/18/2019   Procedure: STENT REMOVAL;  Surgeon: Lemar Lofty., MD;  [facility];  Service: Gastroenterology;;  . TONSILLECTOMY  age 89  . UPPER ESOPHAGEAL ENDOSCOPIC ULTRASOUND (EUS) N/A 08/21/2019   Procedure:  UPPER ESOPHAGEAL ENDOSCOPIC ULTRASOUND (EUS);  Surgeon: Lemar Lofty., MD;  [facility];  Service:  Gastroenterology;  Laterality: N/A;  For cyst gastrostomy     reports that she has never smoked. She has never used smokeless tobacco. She reports that she does not drink alcohol and does not use drugs.  No Known Allergies  Family History  Family history unknown: Yes     Prior to Admission medications   Medication Sig Start Date End Date Taking? Authorizing Provider  albuterol (VENTOLIN HFA) 108 (90 Base) MCG/ACT inhaler Inhale 1-2 puffs into the lungs every 6 (six) hours as needed for wheezing or shortness of breath. 11/04/19   Rancour, Jeannett Senior, MD  amitriptyline (ELAVIL) 75 MG tablet Take 75 mg by mouth at bedtime. 08/10/19   [provider]  amLODipine (NORVASC) 5 MG tablet Take 5 mg by mouth daily.    [provider]  atorvastatin (LIPITOR) 40 MG tablet Take 40 mg by mouth daily.    [provider]  clonazePAM (KLONOPIN) 0.5 MG tablet Take 0.5 mg by mouth daily.  05/23/19   [provider]  doxycycline (VIBRAMYCIN) 100 MG capsule Take 1 capsule (100 mg total) by mouth 2 (two) times daily. 11/04/19   Rancour, Jeannett Senior, MD  JANUMET 50-1000 MG tablet Take 1 tablet by mouth 2 (two) times daily. 09/24/19   [provider]  lamoTRIgine (LAMICTAL) 100 MG tablet Take 100 mg by mouth daily. 09/22/19   [provider]  LANTUS SOLOSTAR 100 UNIT/ML Solostar Pen Inject 40 Units into the skin at bedtime. Patient taking differently: Inject 50 Units into the skin at bedtime.  10/01/19   Layne Benton, NP  LATUDA 60 MG TABS Take 1 tablet by mouth at bedtime. 08/10/19   [provider]  losartan (COZAAR) 25 MG tablet Take 25 mg by mouth daily.    [provider]  ondansetron (ZOFRAN) 8 MG tablet Take 1 tablet (8 mg total) by mouth every 8 (eight) hours as needed for nausea or vomiting. 08/19/19   Regalado, Belkys A, MD  Oxycodone HCl 10 MG TABS Take 10 mg by mouth every 4 (four) hours as needed (pain).  08/12/19   [provider]    pantoprazole (PROTONIX) 40 MG tablet TAKE 1 TABLET (40 MG TOTAL) BY MOUTH 2 (TWO) TIMES DAILY FOR 28 DAYS. 11/09/19 12/07/19  Reva Bores, MD  predniSONE (DELTASONE) 50 MG tablet 1 tablet PO daily 11/04/19   Rancour, Jeannett Senior, MD  propranolol (INDERAL) 20 MG tablet Take 20 mg by mouth 2 (two) times daily.    [provider]  risperiDONE (RISPERDAL) 0.5 MG tablet Take 0.5 mg by mouth 2 (two) times daily. 09/22/19   [provider]  traZODone (DESYREL) 100 MG tablet Take 100 mg by mouth at bedtime.    [provider]    Physical Exam: Vitals:   11/12/19 2256 11/12/19 2300 11/12/19 2328 11/13/19 0300  BP:  (!) 127/92 (!) 131/97 (!) 121/91  Pulse:  (!) 108 (!) 111 (!) 106  Resp:  15  11  Temp:      TempSrc:      SpO2: 100% 100% 100% 96%  Weight:      Height:        Constitutional: NAD, calm, comfortable Eyes: PERRL, lids and conjunctivae normal ENMT: Mucous membranes are moist. Posterior pharynx clear of any exudate or lesions.Normal dentition.  Neck: normal, supple, no masses, no thyromegaly Respiratory: clear to auscultation bilaterally, no wheezing,  no crackles. Normal respiratory effort. No accessory muscle use.  Cardiovascular: Regular rate and rhythm, no murmurs / rubs / gallops. No extremity edema. 2+ pedal pulses. No carotid bruits.  Abdomen: no tenderness, no masses palpated. No hepatosplenomegaly. Bowel sounds positive.  Musculoskeletal: no clubbing / cyanosis. No joint deformity upper and lower extremities. Good ROM, no contractures. Normal muscle tone.  Skin: no rashes, lesions, ulcers. No induration Neurologic: CN 2-12 grossly intact. Sensation intact, DTR normal. Strength 5/5 in all 4.  Psychiatric: Normal judgment and insight. Alert and oriented x 3. Normal mood.    Labs on Admission: I have personally reviewed following labs and imaging studies  CBC: Recent Labs  Lab 11/12/19 1924 11/12/19 2125  WBC 9.4  --   NEUTROABS 7.5  --   HGB  11.8* 11.9*  HCT 39.1 35.0*  MCV 72.4*  --   PLT 343  --    Basic Metabolic Panel: Recent Labs  Lab 11/11/19 1053 11/12/19 1924 11/12/19 2125 11/12/19 2156  NA  --  122* 133* 126*  K  --  4.7 3.0* 3.7  CL  --  85*  --  90*  CO2  --  20*  --  20*  GLUCOSE  --  752*  --  618*  BUN 14 20  --  20  CREATININE 0.97 1.16*  --  1.09*  CALCIUM  --  10.0  --  9.4   GFR: Estimated Creatinine Clearance: 83.6 mL/min (A) (by C-G formula based on SCr of 1.09 mg/dL (H)). Liver Function Tests: Recent Labs  Lab 11/12/19 1924  AST 27  ALT 24  ALKPHOS 84  BILITOT 0.9  PROT 8.3*  ALBUMIN 4.2   No results for input(s): LIPASE, AMYLASE in the last 168 hours. No results for input(s): AMMONIA in the last 168 hours. Coagulation Profile: No results for input(s): INR, PROTIME in the last 168 hours. Cardiac Enzymes: No results for input(s): CKTOTAL, CKMB, CKMBINDEX, TROPONINI in the last 168 hours. BNP (last 3 results) No results for input(s): PROBNP in the last 8760 hours. HbA1C: No results for input(s): HGBA1C in the last 72 hours. CBG: Recent Labs  Lab 11/12/19 2326 11/13/19 0002 11/13/19 0039 11/13/19 0200 11/13/19 0318  GLUCAP 512* 458* 424* 362* 266*   Lipid Profile: No results for input(s): CHOL, HDL, LDLCALC, TRIG, CHOLHDL, LDLDIRECT in the last 72 hours. Thyroid Function Tests: No results for input(s): TSH, T4TOTAL, FREET4, T3FREE, THYROIDAB in the last 72 hours. Anemia Panel: No results for input(s): VITAMINB12, FOLATE, FERRITIN, TIBC, IRON, RETICCTPCT in the last 72 hours. Urine analysis:    Component Value Date/Time   COLORURINE YELLOW 11/12/2019 2132   APPEARANCEUR CLEAR 11/12/2019 2132   LABSPEC <1.005 (L) 11/12/2019 2132   PHURINE 6.0 11/12/2019 2132   GLUCOSEU >=500 (A) 11/12/2019 2132   HGBUR NEGATIVE 11/12/2019 2132   BILIRUBINUR NEGATIVE 11/12/2019 2132   KETONESUR NEGATIVE 11/12/2019 2132   PROTEINUR NEGATIVE 11/12/2019 2132   UROBILINOGEN 0.2 02/12/2015  1348   NITRITE NEGATIVE 11/12/2019 2132   LEUKOCYTESUR NEGATIVE 11/12/2019 2132    Radiological Exams on Admission: DG Chest 2 View  Result Date: 11/12/2019 CLINICAL DATA:  Shortness of breath for 3 hours. EXAM: CHEST - 2 VIEW COMPARISON:  11/03/2019 FINDINGS: Improved lung volumes from prior exam. The cardiomediastinal contours are normal. Resolved left lung base atelectasis from prior. Pulmonary vasculature is normal. No consolidation, pleural effusion, or pneumothorax. No acute osseous abnormalities are seen. IMPRESSION: No acute chest findings. Improved lung volumes with  resolved left lung base atelectasis from recent prior. Electronically Signed   By: Narda Rutherford M.D.   On: 11/12/2019 19:24    EKG: Independently reviewed.  Assessment/Plan Principal Problem:   Hyperglycemia due to diabetes mellitus (HCC) Active Problems:   Type 2 diabetes mellitus (HCC)   Schizophrenia (HCC)   Acute bronchitis    1. Uncontrolled hyperglycemia due to DM - exacerbated by steroid use to try and treat acute bronchitis. 1. Insulin gtt to get BGLs under control 2. BMP Q4H 2. DM2 - 1. Insulin gtt as above 2. Holding home meds 3. Acute bronchitis 1. Adult wheeze protocol 2. PRN albuterol 3. Cont home nebs 4. Will continue steroids started in ED at 40mg  PO daily for the moment for 5 days 4. Schizophrenia / BPD - 1. Cont home psych meds  DVT prophylaxis: Lovenox Code Status: Full Family Communication: No family in room Disposition Plan: Home after DM controlled Consults called: None Admission status: Place in 58   Maverick Dieudonne M. DO [facility]  How to contact the [facility] Attending or Consulting provider 7A - 7P or covering provider during after hours 7P -7A, for this patient?  1. Check the care team in [facility] and look for a) attending/consulting [facility] provider listed and b) the [facility] team listed 2. Log into [email or web address]  Amion Physician Scheduling and messaging for groups and whole  hospitals  On call and physician scheduling software for group practices, residents, hospitalists and other medical providers for call, clinic, rotation and shift schedules. OnCall Enterprise is a hospital-wide system for scheduling doctors and paging doctors on call. EasyPlot is for scientific plotting and data analysis.  [email or web address]  and use [facility]'s universal password to access. If you do not have the password, please contact the hospital operator.  3. Locate the [facility] provider you are looking for under [facility] and page to a number that you can be directly reached. 4. If you still have difficulty reaching the provider, please page the [facility] (Director on Call) for the Hospitalists listed on amion for assistance.  11/13/2019, 3:57 AM

## 2019-11-13 NOTE — ED Notes (Signed)
Report given to David with [facility] 

## 2019-11-13 NOTE — ED Notes (Signed)
Pt expressed concern regarding not receiving night home meds. Especially ones used to assist with falling asleep. Attempted contact with admitting MD.

## 2019-11-13 NOTE — Progress Notes (Signed)
   Follow Up Note  HPI: 54 year old female past medical history of morbid obesity and poorly controlled diabetes mellitus on long-term insulin admitted earlier this morning for DKA following recent prednisone use from respiratory issue.  Earlier this morning, patient out of DKA and started back on subcu insulin.   Exam: CV: Regular rate and rhythm, S1-S2 Lungs: Clear auscultation bilaterally   Principal Problem:   Hyperglycemia due to diabetes mellitus (HCC) with DKA: DKA looks to be resolved, but patient is on a very large dose of insulin and still poorly controlled.  She was taking 60 units plus Trulicity.  In the last week or so, this was changed to 70 units of Lantus and Trulicity was stopped.  However, her A1c a month ago was at 13.6.  Have increased her to 80 units which gave her this morning, but concerned since she takes it at night, I discussed this with diabetes coordinator and we will monitor her closely giving her an additional 10 units tonight and possibly 10 units in the morning depending on how her sugars do with a controlled diet and she can transition back to 80 units nightly starting tomorrow evening.  Advised her with these poorly controlled diabetes mellitus, she would do well with seeing a diabetes specialist.  She is amenable to this and we will set her up with a referral. Active Problems:   Type 2 diabetes mellitus (HCC)   Schizophrenia (HCC): Continue home medications.   Acute bronchitis: Fortunately has completed steroid   DKA (diabetic ketoacidoses) (HCC)   Disposition: Anticipate discharge tomorrow.

## 2019-11-13 NOTE — ED Notes (Signed)
PAGED TRIAD TO RN WILL--Alizabeth Antonio °

## 2019-11-13 NOTE — ED Notes (Signed)
Transported to [facility] via [facility] 

## 2019-11-13 NOTE — Progress Notes (Addendum)
Inpatient Diabetes Program Recommendations  AACE/ADA: New Consensus Statement on Inpatient Glycemic Control (2015)  Target Ranges:  Prepandial:   less than 140 mg/dL      Peak postprandial:   less than 180 mg/dL (1-2 hours)      Critically ill patients:  140 - 180 mg/dL   Lab Results  Component Value Date   GLUCAP 208 (H) 11/13/2019   HGBA1C 13.6 (H) 10/16/2019    Review of Glycemic Control Results for Monique Holland, Monique Holland [facility] (MRN [id number]) as of 11/13/2019 13:27  Ref. Range 11/13/2019 08:18 11/13/2019 09:29 11/13/2019 10:49 11/13/2019 12:05 11/13/2019 12:38  Glucose-Capillary Latest Ref Range: 70 - 99 mg/dL 875 (H) 643 (H) 329 (H) 185 (H) 208 (H)   Diabetes history: Type 2 DM Outpatient Diabetes medications: Janumet 50-1000 mg BID, Lantus 70 units QHS Current orders for Inpatient glycemic control: Lantus 80 units QHS,  Novolog 0-20 units TID,Novolog 0-5 units QHS  Inpatient Diabetes Program Recommendations:    Spoke with patient regarding outpatient diabetes management. Patient denies missing doses, verified outpatient dosages.  Reviewed patient's current A1c of 13.6%. Explained what a A1c is and what it measures. Also reviewed goal A1c with patient, importance of good glucose control @ home, and blood sugar goals. Reviewed patho of DM, need for insulin, role of pancreas, signs and symptoms of hypoglycemia vs hyperglycemia, interventions, vascular changes, impact of missing doses and commorbidities.  Patient has a meter and testing supplies. Reports when she checks they range from 200-450 mg/dL) Encouraged to check 2-3 times per day. Reviewed when to call MD.  Admits to drinking sugary beverages everyday and reports that she could do better. Reviewed alternative, plate method, how to read nutritional labels, and encouraged mindfulness.  Encouraged patient to make appointment for follow up with PCP. Patient does not have any further questions at this time.  Thanks, Lujean Rave, MSN,  RNC-OB Diabetes Coordinator [phone] (8a-5p)

## 2019-11-13 NOTE — ED Notes (Signed)
Checked patient cbg it was 79 notified  RN of blood sugar patient is resting with call bell in reach

## 2019-11-13 NOTE — ED Notes (Signed)
Checked CBG 424, RN Marjorie informed

## 2019-11-14 ENCOUNTER — Ambulatory Visit (HOSPITAL_BASED_OUTPATIENT_CLINIC_OR_DEPARTMENT_OTHER): Payer: Medicare Other

## 2019-11-14 ENCOUNTER — Encounter (HOSPITAL_COMMUNITY): Payer: Self-pay | Admitting: Internal Medicine

## 2019-11-14 DIAGNOSIS — I9589 Other hypotension: Secondary | ICD-10-CM

## 2019-11-14 DIAGNOSIS — E861 Hypovolemia: Secondary | ICD-10-CM

## 2019-11-14 DIAGNOSIS — N179 Acute kidney failure, unspecified: Secondary | ICD-10-CM

## 2019-11-14 LAB — BASIC METABOLIC PANEL
Anion gap: 10 (ref 5–15)
Anion gap: 10 (ref 5–15)
BUN: 22 mg/dL — ABNORMAL HIGH (ref 6–20)
BUN: 25 mg/dL — ABNORMAL HIGH (ref 6–20)
CO2: 22 mmol/L (ref 22–32)
CO2: 23 mmol/L (ref 22–32)
Calcium: 9.2 mg/dL (ref 8.9–10.3)
Calcium: 8.5 mg/dL — ABNORMAL LOW (ref 8.9–10.3)
Chloride: 99 mmol/L (ref 98–111)
Chloride: 102 mmol/L (ref 98–111)
Creatinine, Ser: 1.42 mg/dL — ABNORMAL HIGH (ref 0.44–1.00)
Creatinine, Ser: 1.18 mg/dL — ABNORMAL HIGH (ref 0.44–1.00)
GFR calc Af Amer: 49 mL/min — ABNORMAL LOW (ref >60)
GFR calc Af Amer: >60 mL/min (ref >60)
GFR calc non Af Amer: 42 mL/min — ABNORMAL LOW (ref >60)
GFR calc non Af Amer: 53 mL/min — ABNORMAL LOW (ref >60)
Glucose, Bld: 332 mg/dL — ABNORMAL HIGH (ref 70–99)
Glucose, Bld: 111 mg/dL — ABNORMAL HIGH (ref 70–99)
Potassium: 3.8 mmol/L (ref 3.5–5.1)
Potassium: 3.6 mmol/L (ref 3.5–5.1)
Sodium: 131 mmol/L — ABNORMAL LOW (ref 135–145)
Sodium: 135 mmol/L (ref 135–145)

## 2019-11-14 LAB — CBG MONITORING, ED
Glucose-Capillary: 174 mg/dL — ABNORMAL HIGH (ref 70–99)
Glucose-Capillary: 213 mg/dL — ABNORMAL HIGH (ref 70–99)
Glucose-Capillary: 188 mg/dL — ABNORMAL HIGH (ref 70–99)
Glucose-Capillary: 156 mg/dL — ABNORMAL HIGH (ref 70–99)

## 2019-11-14 LAB — GLUCOSE, CAPILLARY
Glucose-Capillary: 99 mg/dL (ref 70–99)
Glucose-Capillary: 297 mg/dL — ABNORMAL HIGH (ref 70–99)

## 2019-11-14 MED ORDER — SODIUM CHLORIDE 0.9 % IV BOLUS
500.0000 mL | Freq: Once | INTRAVENOUS | Status: AC
  Administered 2019-11-14: 500 mL via INTRAVENOUS

## 2019-11-14 MED ORDER — INSULIN GLARGINE 100 UNIT/ML ~~LOC~~ SOLN
25.0000 [IU] | Freq: Every day | SUBCUTANEOUS | Status: AC
  Administered 2019-11-14: 25 [IU] via SUBCUTANEOUS
  Filled 2019-11-14: qty 0.25

## 2019-11-14 MED ORDER — INSULIN GLARGINE 100 UNIT/ML ~~LOC~~ SOLN
60.0000 [IU] | Freq: Two times a day (BID) | SUBCUTANEOUS | Status: DC
  Administered 2019-11-15 – 2019-11-17 (×5): 60 [IU] via SUBCUTANEOUS
  Filled 2019-11-14 (×6): qty 0.6

## 2019-11-14 MED ORDER — SODIUM CHLORIDE 0.9 % IV SOLN: INTRAVENOUS | Status: DC

## 2019-11-14 MED ORDER — SODIUM CHLORIDE 0.9 % IV BOLUS
1000.0000 mL | Freq: Once | INTRAVENOUS | Status: AC
  Administered 2019-11-14: 1000 mL via INTRAVENOUS

## 2019-11-14 NOTE — Progress Notes (Signed)
NEW ADMISSION NOTE  Arrival Method: bed Mental Orientation: alert and oriented x4 Telemetry: 3 Assessment: Completed Skin: intact, edema non pitting on right forearm IV: left forearm clean dry intact Pain: 0 Tubes:  Safety Measures: Safety Fall Prevention Plan has been given, discussed and signed Admission: Completed [facility] [place] Orientation: Patient has been orientated to the room, unit and staff.  Family:   Orders have been reviewed and implemented. Will continue to monitor the patient. Call light has been placed within reach and bed alarm has been activated.   Pat Patrick, RN

## 2019-11-14 NOTE — ED Notes (Signed)
Checked patient cbg it was 60 notified RN of blood sugar patient is now eating breakfast with call bell in reach

## 2019-11-14 NOTE — ED Notes (Signed)
Lunch Tray Ordered @ 1052.  

## 2019-11-14 NOTE — Progress Notes (Addendum)
Inpatient Diabetes Program Recommendations  AACE/ADA: New Consensus Statement on Inpatient Glycemic Control (2015)  Target Ranges:  Prepandial:   less than 140 mg/dL      Peak postprandial:   less than 180 mg/dL (1-2 hours)      Critically ill patients:  140 - 180 mg/dL   Lab Results  Component Value Date   GLUCAP 213 (H) 11/14/2019   HGBA1C 13.6 (H) 10/16/2019    Review of Glycemic Control Results for Monique Holland, Mon[facility] Holland [facility] (MRN [id number]) as of 11/14/2019 09:07  Ref. Range 11/13/2019 19:07 11/13/2019 21:15 11/14/2019 06:34 11/14/2019 08:07  Glucose-Capillary Latest Ref Range: 70 - 99 mg/dL 646 (HH) 803 (HH) 212 (H) 213 (H)   Diabetes history: Type 2 DM Outpatient Diabetes medications: Janumet 50-1000 mg BID, Lantus 70 units QHS Current orders for Inpatient glycemic control: Lantus 80 units QD,  Novolog 0-20 units TID,Novolog 0-5 units QHS Lantus 25 units x 1, Novolog 25 units x 2 Prednisone 60 mg x 1, 40 mg x1   Inpatient Diabetes Program Recommendations:   In preparation for discharge,  -Lantus 25 units QHS x1 (to start tonight) then Lantus 60 units BID to follow.  Addendum: Spoke with patient regarding hyperglycemia last night of > 500's mg/dL. Patient denies snacking, drinking regular sodas/juices/tea, or eating a meal. Patient states, "I ate a bite of my food and did not like it, so I just didn't eat." Assuming increase is related to Prednisone and missed correction dose at dinner time.   Changes made to orders, following.  Thanks, Lujean Rave, MSN, RNC-OB Diabetes Coordinator [phone] (8a-5p)

## 2019-11-14 NOTE — Progress Notes (Signed)
PROGRESS NOTE  Monique Holland DTO:[id number] DOB: 06/06/1965 DOA: 11/12/2019 PCP: Knox Royalty, MD  HPI/Recap of past 58 hours: 54 year old female past medical history of morbid obesity and poorly controlled diabetes mellitus on long-term insulin admitted on early morning of 6/23 for DKA following recent prednisone use from respiratory issue.  Following treatment with IV fluids and insulin drip, DKA resolved and she was changed over to Lantus insulin.  However, by evening of 6/23, start developing significant hyperglycemia although not back in DKA.  This did lead to auto diuresis and hypotension and recurrent acute kidney injury.  Patient treated with aggressive IV fluids and CBGs better this morning.  By noon, blood pressure still low and patient with nausea and vomiting, likely secondary to this.  Still feels very weak.  Assessment/Plan: Principal Problem: Poorly controlled hyperglycemia due to diabetes mellitus (HCC) presenting initially with DKA: DKA soon resolved following admission, however patient on very large dose of insulin and still poorly controlled.  She was previously taking 60 units of Lantus plus Trulicity but in the week prior to admission, this was changed to Lantus 70 units and Trulicity discontinued.  A1c a month ago was 13.6.  Increased her to 80 units and in discussion with diabetes coordinator, plan will be to give patient 25 units of Lantus tonight and then start her on a new basal dose of 60 units twice a day Lantus.  Patient amenable to this plan. Active Problems:   AKI (acute kidney injury) (HCC): Secondary to hyperglycemia and DKA.  Improving with IV fluids.    Hypotension: Secondary to auto diuresis from hyperglycemia.  Have given multiple fluid boluses and high-dose IV fluids.  Blood pressure improving.    Schizophrenia [facility]): Continue home medications.    Obesity, Class III, BMI 40-49.9 (morbid obesity) (HCC): Meets criteria with BMI greater than 40.     Acute bronchitis: Looks to be resolved.  Has completed prednisone course.    Code Status: Full code  Family Communication: Declined for me to call family  Disposition Plan: Anticipate discharge tomorrow as long as CBGs, blood pressure and renal function stable   Consultants:  None  Procedures:  None  Antimicrobials:  None  DVT prophylaxis: Lovenox   Objective: Vitals:   11/14/19 1331 11/14/19 1507  BP: (!) 92/59 116/78  Pulse: 87 83  Resp: 14 16  Temp: 98.2 F (36.8 C) (!) 97.4 F (36.3 C)  SpO2: 100% 99%   No intake or output data in the 24 hours ending 11/14/19 1740 Filed Weights   11/12/19 1838 11/14/19 1507  Weight: 123.4 kg 128.4 kg   Body mass index is 43.04 kg/m.  Exam:   General: Alert and oriented x3, mild distress secondary nausea  Cardiovascular: Regular rate and rhythm, S1-S2  Respiratory: Clear to auscultation bilaterally  Abdomen: Soft nontender, nondistended, hypoactive bowel sounds  Musculoskeletal: No clubbing or cyanosis or edema  Skin: No skin breaks, tears or lesions  Psychiatry: Slightly flattened affect, otherwise appropriate, no evidence of psychosis  Neuro: No focal deficits   Data Reviewed: CBC: Recent Labs  Lab 11/12/19 1924 11/12/19 2125  WBC 9.4  --   NEUTROABS 7.5  --   HGB 11.8* 11.9*  HCT 39.1 35.0*  MCV 72.4*  --   PLT 343  --    Basic Metabolic Panel: Recent Labs  Lab 11/13/19 0824 11/13/19 1320 11/13/19 2025 11/14/19 0029 11/14/19 1526  NA 132* 131* 126* 131* 135  K 4.0 4.5 4.5 3.8 3.6  CL 98 96* 93* 99 102  CO2 23 23 20* 22 23  GLUCOSE 254* 268* 521* 332* 111*  BUN 13 13 19  22* 25*  CREATININE 0.77 0.80 1.21* 1.42* 1.18*  CALCIUM 9.4 9.4 9.3 9.2 8.5*   GFR: Estimated Creatinine Clearance: 79 mL/min (A) (by C-G formula based on SCr of 1.18 mg/dL (H)). Liver Function Tests: Recent Labs  Lab 11/12/19 1924  AST 27  ALT 24  ALKPHOS 84  BILITOT 0.9  PROT 8.3*  ALBUMIN 4.2   No  results for input(s): LIPASE, AMYLASE in the last 168 hours. No results for input(s): AMMONIA in the last 168 hours. Coagulation Profile: No results for input(s): INR, PROTIME in the last 168 hours. Cardiac Enzymes: No results for input(s): CKTOTAL, CKMB, CKMBINDEX, TROPONINI in the last 168 hours. BNP (last 3 results) No results for input(s): PROBNP in the last 8760 hours. HbA1C: No results for input(s): HGBA1C in the last 72 hours. CBG: Recent Labs  Lab 11/14/19 0634 11/14/19 0807 11/14/19 1118 11/14/19 1402 11/14/19 1650  GLUCAP 174* 213* 188* 156* 99   Lipid Profile: No results for input(s): CHOL, HDL, LDLCALC, TRIG, CHOLHDL, LDLDIRECT in the last 72 hours. Thyroid Function Tests: No results for input(s): TSH, T4TOTAL, FREET4, T3FREE, THYROIDAB in the last 72 hours. Anemia Panel: No results for input(s): VITAMINB12, FOLATE, FERRITIN, TIBC, IRON, RETICCTPCT in the last 72 hours. Urine analysis:    Component Value Date/Time   COLORURINE YELLOW 11/12/2019 2132   APPEARANCEUR CLEAR 11/12/2019 2132   LABSPEC <1.005 (L) 11/12/2019 2132   PHURINE 6.0 11/12/2019 2132   GLUCOSEU >=500 (A) 11/12/2019 2132   HGBUR NEGATIVE 11/12/2019 2132   BILIRUBINUR NEGATIVE 11/12/2019 2132   KETONESUR NEGATIVE 11/12/2019 2132   PROTEINUR NEGATIVE 11/12/2019 2132   UROBILINOGEN 0.2 02/12/2015 1348   NITRITE NEGATIVE 11/12/2019 2132   LEUKOCYTESUR NEGATIVE 11/12/2019 2132   Sepsis Labs: @LABRCNTIP (procalcitonin:4,lacticidven:4)  ) Recent Results (from the past 240 hour(s))  SARS Coronavirus 2 by RT PCR (hospital order, performed in [facility] hospital lab) Nasopharyngeal Nasopharyngeal Swab     Status: None   Collection Time: 11/12/19  9:32 PM   Specimen: Nasopharyngeal Swab  Result Value Ref Range Status   SARS Coronavirus 2 NEGATIVE NEGATIVE Final    Comment: (NOTE) SARS-CoV-2 target nucleic acids are NOT DETECTED.  The SARS-CoV-2 RNA is generally detectable in upper and  lower respiratory specimens during the acute phase of infection. The lowest concentration of SARS-CoV-2 viral copies this assay can detect is 250 copies / mL. A negative result does not preclude SARS-CoV-2 infection and should not be used as the sole basis for treatment or other patient management decisions.  A negative result may occur with improper specimen collection / handling, submission of specimen other than nasopharyngeal swab, presence of viral mutation(s) within the areas targeted by this assay, and inadequate number of viral copies (<250 copies / mL). A negative result must be combined with clinical observations, patient history, and epidemiological information.  Fact Sheet for Patients:   StrictlyIdeas.no  Fact Sheet for Healthcare Providers: BankingDealers.co.za  This test is not yet approved or  cleared by the [place] FDA and has been authorized for detection and/or diagnosis of SARS-CoV-2 by FDA under an Emergency Use Authorization (EUA).  This EUA will remain in effect (meaning this test can be used) for the duration of the COVID-19 declaration under Section 564(b)(1) of the Act, 21 U.S.C. section 360bbb-3(b)(1), unless the authorization is terminated or revoked sooner.  Performed  at [facility], [address] [place], [place] [address]       Studies: No results found.  Scheduled Meds: . amitriptyline  75 mg Oral QHS  . amLODipine  5 mg Oral Daily  . ARIPiprazole  2 mg Oral Daily  . atorvastatin  40 mg Oral q1800  . clonazePAM  0.5 mg Oral Daily  . diclofenac  75 mg Oral BID  . enoxaparin (LOVENOX) injection  40 mg Subcutaneous Daily  . insulin aspart  0-20 Units Subcutaneous TID WC  . insulin aspart  0-5 Units Subcutaneous QHS  . insulin glargine  25 Units Subcutaneous QHS  . [START ON 11/15/2019] insulin glargine  60 Units Subcutaneous BID  . lamoTRIgine  200 mg Oral Daily  . losartan   25 mg Oral Daily  . lurasidone  80 mg Oral QHS  . pantoprazole  40 mg Oral BID  . propranolol  20 mg Oral BID  . risperiDONE  0.5 mg Oral QHS  . traZODone  100 mg Oral QHS    Continuous Infusions: . sodium chloride Stopped (11/13/19 1315)  . sodium chloride 125 mL/hr at 11/14/19 1658     LOS: 1 day     Hollice Espy, MD [facility]   11/14/2019, 5:40 PM

## 2019-11-15 LAB — GLUCOSE, CAPILLARY
Glucose-Capillary: 331 mg/dL — ABNORMAL HIGH (ref 70–99)
Glucose-Capillary: 236 mg/dL — ABNORMAL HIGH (ref 70–99)
Glucose-Capillary: 266 mg/dL — ABNORMAL HIGH (ref 70–99)
Glucose-Capillary: 250 mg/dL — ABNORMAL HIGH (ref 70–99)

## 2019-11-15 LAB — BASIC METABOLIC PANEL
Anion gap: 11 (ref 5–15)
BUN: 22 mg/dL — ABNORMAL HIGH (ref 6–20)
CO2: 21 mmol/L — ABNORMAL LOW (ref 22–32)
Calcium: 8.1 mg/dL — ABNORMAL LOW (ref 8.9–10.3)
Chloride: 100 mmol/L (ref 98–111)
Creatinine, Ser: 1.09 mg/dL — ABNORMAL HIGH (ref 0.44–1.00)
GFR calc Af Amer: >60 mL/min (ref >60)
GFR calc non Af Amer: 58 mL/min — ABNORMAL LOW (ref >60)
Glucose, Bld: 394 mg/dL — ABNORMAL HIGH (ref 70–99)
Potassium: 4.1 mmol/L (ref 3.5–5.1)
Sodium: 132 mmol/L — ABNORMAL LOW (ref 135–145)

## 2019-11-15 MED ORDER — ENSURE MAX PROTEIN PO LIQD
11.0000 [oz_av] | Freq: Two times a day (BID) | ORAL | Status: DC
  Filled 2019-11-15 (×6): qty 330

## 2019-11-15 NOTE — Progress Notes (Signed)
PROGRESS NOTE  Monique Holland PYP:[id number] DOB: [birth year] DOA: 11/12/2019 PCP: Kristie Cowman, MD  Brief History   The patient is a 54 yr old woman who presented to [facility] on 11/12/2019 in DKA. She was admitted to a step down unit on a DKA protocol. She closed her gap and was transferred back to sub Q lantus and SSI. However, she continues to have poorly controlled glucoses. Her HbA1c as of 10/16/2019 was 13.6.   Consultants  . None  Procedures  . None  Antibiotics   Anti-infectives (From admission, onward)   Start     Dose/Rate Route Frequency Ordered Stop   11/13/19 1000  doxycycline (VIBRAMYCIN) capsule 100 mg  Status:  Discontinued       Note to Pharmacy: Patient taking differently: For 10 days. Starting 11/03/19.     100 mg Oral 2 times daily 11/13/19 0416 11/13/19 0429   11/13/19 1000  doxycycline (VIBRA-TABS) tablet 100 mg       Note to Pharmacy: Patient taking differently: For 10 days. Starting 11/03/19.     100 mg Oral 2 times daily 11/13/19 0429 11/13/19 2136    .   Subjective  The patient states that she is nauseated and fatigued this am. Glucoses remain in the high three hundreds.  Objective   Vitals:  Vitals:   11/15/19 0954 11/15/19 1813  BP: (!) 122/93 117/71  Pulse: 88 87  Resp: 18 18  Temp: 98.7 F (37.1 C) 98.2 F (36.8 C)  SpO2: 98% 99%   Exam:  Constitutional:  . The patient is awake, alert, and oriented x 3. Mild distress from nausea. Respiratory:  . No increased work of breathing. . Lung sounds are distant . No wheezes, rales, or rhonchi . No tactile fremitus Cardiovascular:  . Regular rate and rhythm . Heart sounds are distant. . No murmurs, ectopy, or gallups. . No lateral PMI. No thrills. Abdomen:  . Abdomen is morbidly obese . Soft, non-tender, non-distended. . No hernias, masses, or organomegaly can be appreciated due to the patient's body habitus. . Bowel sounds are distant. Musculoskeletal:  . No cyanosis, clubbing, or  edema Skin:  . No rashes, lesions, ulcers . palpation of skin: no induration or nodules Neurologic:  . CN 2-12 intact . Sensation all 4 extremities intact Psychiatric:  . Mental status o Mood, affect appropriate o Orientation to person, place, time  . judgment and insight appear intact  I have personally reviewed the following:   Today's Data  . BMP, GFR, Glucoses  Scheduled Meds: . amitriptyline  75 mg Oral QHS  . amLODipine  5 mg Oral Daily  . ARIPiprazole  2 mg Oral Daily  . atorvastatin  40 mg Oral q1800  . clonazePAM  0.5 mg Oral Daily  . diclofenac  75 mg Oral BID  . enoxaparin (LOVENOX) injection  40 mg Subcutaneous Daily  . insulin aspart  0-20 Units Subcutaneous TID WC  . insulin aspart  0-5 Units Subcutaneous QHS  . insulin glargine  60 Units Subcutaneous BID  . lamoTRIgine  200 mg Oral Daily  . losartan  25 mg Oral Daily  . lurasidone  80 mg Oral QHS  . pantoprazole  40 mg Oral BID  . propranolol  20 mg Oral BID  . Ensure Max Protein  11 oz Oral BID  . risperiDONE  0.5 mg Oral QHS  . traZODone  100 mg Oral QHS   Continuous Infusions: . sodium chloride Stopped (11/13/19 1315)  . sodium chloride  125 mL/hr at 11/15/19 1749    Principal Problem:   Hyperglycemia due to diabetes mellitus (HCC) Active Problems:   AKI (acute kidney injury) (HCC)   Hypotension   Type 2 diabetes mellitus (HCC)   Schizophrenia (HCC)   Obesity, Class III, BMI 40-49.9 (morbid obesity) (HCC)   Acute bronchitis   DKA (diabetic ketoacidoses) (HCC)   LOS: 2 days   A & P  DKA: patient closed gap and has been transitioned to sub Q lantus and SSI.   DM II: Uncontrolled. HbA1c on 10/16/2019 was 13.6. Pt lantus has been increased to 60 units bid with Resistent SSI. Monitor.   AKI on CKD II: Creatinine at 1.09. Likely chronic and due to DM II and hypertension. Monitor creatinine, electrolytes, and volume status. Avoid nephrotoxic substances and hypotension.  Acute bronchitis:  Improving. The patient is receiving albuterol nebs.  Hypotension: Resolved.  Hyperlipidemia: Continue lipitor.  Essential Hypertension: Pt is now normotensive on Cozaar, norvasc, Propranolol  Schizophrenia: Continue Risperdal, latuda, Abilify, Elavil, Clonazepam, Lamictal, Desyrel  I have seen and examined this patient myself. I have spent 38 minutes in her evaluation and care.  DVT Prophylaxis: Lovenox CODE STATUS: Full Code Family Communication: None available Disposition: The patient is from home. Anticipate discharge to home. Barriers to discharge: Continued poor control of glucoses.  Status is: Inpatient  Remains inpatient appropriate because:Inpatient level of care appropriate due to severity of illness   Dispo: The patient is from: Home              Anticipated d/c is to: Home              Anticipated d/c date is: 2 days              Patient currently is not medically stable to d/c.  Brette Cast, DO [facility] Direct contact: see [email or web address]  7PM-7AM contact night coverage as above 11/15/2019, 7:07 PM  LOS: 2 days

## 2019-11-15 NOTE — Discharge Instructions (Signed)

## 2019-11-15 NOTE — Progress Notes (Addendum)
Inpatient Diabetes Program Recommendations  AACE/ADA: New Consensus Statement on Inpatient Glycemic Control (2015)  Target Ranges:  Prepandial:   less than 140 mg/dL      Peak postprandial:   less than 180 mg/dL (1-2 hours)      Critically ill patients:  140 - 180 mg/dL   Lab Results  Component Value Date   GLUCAP 331 (H) 11/15/2019   HGBA1C 13.6 (H) 10/16/2019    Review of Glycemic Control Results for Monique Holland, Monique Holland [facility] (MRN [id number]) as of 11/15/2019 08:40  Ref. Range 11/14/2019 14:02 11/14/2019 16:50 11/14/2019 21:27 11/15/2019 06:47  Glucose-Capillary Latest Ref Range: 70 - 99 mg/dL 811 (H) 99 031 (H) 594 (H)   Diabetes history:Type 2 DM Outpatient Diabetes medications:Janumet 50-1000 mg BID, Lantus 70 units QHS Current orders for Inpatient glycemic control:Lantus 80 units QD, Novolog 0-20 units TID,Novolog 0-5 units QHS Lantus 25 units x 1  Inpatient Diabetes Program Recommendations:   Consider adding Novolog 6 units TID (assuming patient is consuming >50% of meal).  In preparation for discharge,  -Continue with plan for Lantus 60 units BID.  Secure chat sent to MD. Thanks, Lujean Rave, MSN, RNC-OB Diabetes Coordinator [phone] (8a-5p)

## 2019-11-15 NOTE — Progress Notes (Signed)
Initial Nutrition Assessment  DOCUMENTATION CODES:   Morbid obesity  INTERVENTION:  Provided diabetic diet education  Ensure Max po BID, each supplement provides 150 kcal and 30 grams of protein  NUTRITION DIAGNOSIS:   Inadequate oral intake related to poor appetite as evidenced by per patient/family report.    GOAL:   Patient will meet greater than or equal to 90% of their needs    MONITOR:   PO intake, Supplement acceptance, Labs, I & O's  REASON FOR ASSESSMENT:   Malnutrition Screening Tool    ASSESSMENT:   Pt with PMH significant for DM, schizophrenia, HTN, and GERD admitted for DKA. Once resolved, pt then developed significant hyperglycemia leading to auto diuresis, hypotension, and AKI.   Per MD, pt's CBGs are improved but pt still complaining of N/V.    Pt states she is feeling poorly at time of visit. Per pt, she has had a poor appetite and decreased intake since February. Pt attributes her poor appetite to pancreatitis. At baseline, pt reports eating only 1 meal per day with snacks throughout the day. RD provided diabetic diet education and will order supplements to aid in meeting calorie/protein needs.   Per wt readings, pt has had a 5.11% wt loss x2 months, which is not significant for time frame.   PO Intake: 50-75% x 2 recorded meals  Labs: Na 132 (L), CBGs 99-331 (Diabetes Coordinator following) Medications: Novolog, Lantus, Protonix  NUTRITION - FOCUSED PHYSICAL EXAM:    Most Recent Value  Orbital Region No depletion  Upper Arm Region No depletion  Thoracic and Lumbar Region No depletion  Buccal Region No depletion  Temple Region No depletion  Clavicle Bone Region No depletion  Clavicle and Acromion Bone Region No depletion  Scapular Bone Region No depletion  Dorsal Hand No depletion  Patellar Region No depletion  Anterior Thigh Region No depletion  Posterior Calf Region Mild depletion  Edema (RD Assessment) Mild  Hair Reviewed  Eyes  Reviewed  Mouth Reviewed  Skin Reviewed  Nails Reviewed       Diet Order:   Diet Order            Diet Carb Modified Fluid consistency: Thin; Room service appropriate? Yes  Diet effective now                 EDUCATION NEEDS:   Education needs have been addressed  Skin:  Skin Assessment: Reviewed RN Assessment  Last BM:  6/21  Height:   Ht Readings from Last 1 Encounters:  11/14/19 5\' 8"  (1.727 m)    Weight:   Wt Readings from Last 10 Encounters:  11/14/19 128.4 kg  11/03/19 125.2 kg  10/28/19 129.7 kg  10/18/19 128.8 kg  09/29/19 130.7 kg  09/24/19 128.8 kg  09/18/19 132 kg  09/10/19 132 kg  09/05/19 132 kg  08/26/19 135.6 kg    BMI:  Body mass index is 43.04 kg/m.  Estimated Nutritional Needs:   Kcal:  2000-2200  Protein:  130-160 grams  Fluid:  >2L/d    10/26/19, MS, RD, LDN RD pager number and weekend/on-call pager number located in Amion.

## 2019-11-15 NOTE — Plan of Care (Signed)
  Problem: Education: Goal: Knowledge of General Education information will improve Description Including pain rating scale, medication(s)/side effects and non-pharmacologic comfort measures Outcome: Progressing   

## 2019-11-15 NOTE — Plan of Care (Signed)
  Problem: Education: Goal: Knowledge of General Education information will improve Description: Including pain rating scale, medication(s)/side effects and non-pharmacologic comfort measures Outcome: Progressing   Problem: Pain Managment: Goal: General experience of comfort will improve Outcome: Progressing   

## 2019-11-16 LAB — BASIC METABOLIC PANEL
Anion gap: 10 (ref 5–15)
BUN: 17 mg/dL (ref 6–20)
CO2: 22 mmol/L (ref 22–32)
Calcium: 7.9 mg/dL — ABNORMAL LOW (ref 8.9–10.3)
Chloride: 104 mmol/L (ref 98–111)
Creatinine, Ser: 0.95 mg/dL (ref 0.44–1.00)
GFR calc Af Amer: >60 mL/min (ref >60)
GFR calc non Af Amer: >60 mL/min (ref >60)
Glucose, Bld: 301 mg/dL — ABNORMAL HIGH (ref 70–99)
Potassium: 3.7 mmol/L (ref 3.5–5.1)
Sodium: 136 mmol/L (ref 135–145)

## 2019-11-16 LAB — GLUCOSE, CAPILLARY
Glucose-Capillary: 275 mg/dL — ABNORMAL HIGH (ref 70–99)
Glucose-Capillary: 182 mg/dL — ABNORMAL HIGH (ref 70–99)
Glucose-Capillary: 137 mg/dL — ABNORMAL HIGH (ref 70–99)
Glucose-Capillary: 252 mg/dL — ABNORMAL HIGH (ref 70–99)
Glucose-Capillary: 253 mg/dL — ABNORMAL HIGH (ref 70–99)

## 2019-11-16 MED ORDER — INSULIN ASPART 100 UNIT/ML ~~LOC~~ SOLN
10.0000 [IU] | Freq: Once | SUBCUTANEOUS | Status: AC
  Administered 2019-11-16: 10 [IU] via SUBCUTANEOUS

## 2019-11-16 NOTE — Progress Notes (Signed)
PROGRESS NOTE  Monique Holland YTK:[id number] DOB: [birth year] DOA: 11/12/2019 PCP: Kristie Cowman, MD  Brief History   The patient is a 54 yr old woman who presented to [facility] on 11/12/2019 in DKA. She was admitted to a step down unit on a DKA protocol. She closed her gap and was transferred back to sub Q lantus and SSI. However, she continues to have poorly controlled glucoses. Her HbA1c as of 10/16/2019 was 13.6.   Consultants  . None  Procedures  . None  Antibiotics   Anti-infectives (From admission, onward)   Start     Dose/Rate Route Frequency Ordered Stop   11/13/19 1000  doxycycline (VIBRAMYCIN) capsule 100 mg  Status:  Discontinued       Note to Pharmacy: Patient taking differently: For 10 days. Starting 11/03/19.     100 mg Oral 2 times daily 11/13/19 0416 11/13/19 0429   11/13/19 1000  doxycycline (VIBRA-TABS) tablet 100 mg       Note to Pharmacy: Patient taking differently: For 10 days. Starting 11/03/19.     100 mg Oral 2 times daily 11/13/19 0429 11/13/19 2136      Subjective  The patient states that she is feeling a little better. Glucoses are somewhat improved.  Objective   Vitals:  Vitals:   11/16/19 0958 11/16/19 1639  BP: 123/84 (!) 133/92  Pulse: 87 84  Resp: 20 16  Temp: 98 F (36.7 C) 97.9 F (36.6 C)  SpO2: 100% 99%   Exam:  Constitutional:  . The patient is awake, alert, and oriented x 3. No acute distress. Respiratory:  . No increased work of breathing. . Lung sounds are distant . No wheezes, rales, or rhonchi . No tactile fremitus Cardiovascular:  . Regular rate and rhythm . Heart sounds are distant. . No murmurs, ectopy, or gallups. . No lateral PMI. No thrills. Abdomen:  . Abdomen is morbidly obese . Soft, non-tender, non-distended. . No hernias, masses, or organomegaly can be appreciated due to the patient's body habitus. . Bowel sounds are distant. Musculoskeletal:  . No cyanosis, clubbing, or edema Skin:  . No rashes, lesions,  ulcers . palpation of skin: no induration or nodules Neurologic:  . CN 2-12 intact . Sensation all 4 extremities intact Psychiatric:  . Mental status o Mood, affect appropriate o Orientation to person, place, time  . judgment and insight appear intact  I have personally reviewed the following:   Today's Data  . BMP, GFR, Glucoses  Scheduled Meds: . amitriptyline  75 mg Oral QHS  . amLODipine  5 mg Oral Daily  . ARIPiprazole  2 mg Oral Daily  . atorvastatin  40 mg Oral q1800  . clonazePAM  0.5 mg Oral Daily  . diclofenac  75 mg Oral BID  . enoxaparin (LOVENOX) injection  40 mg Subcutaneous Daily  . insulin aspart  0-20 Units Subcutaneous TID WC  . insulin aspart  0-5 Units Subcutaneous QHS  . insulin glargine  60 Units Subcutaneous BID  . lamoTRIgine  200 mg Oral Daily  . losartan  25 mg Oral Daily  . lurasidone  80 mg Oral QHS  . pantoprazole  40 mg Oral BID  . propranolol  20 mg Oral BID  . Ensure Max Protein  11 oz Oral BID  . risperiDONE  0.5 mg Oral QHS  . traZODone  100 mg Oral QHS   Continuous Infusions: . sodium chloride Stopped (11/13/19 1315)  . sodium chloride 125 mL/hr at 11/16/19 [phone]  Principal Problem:   Hyperglycemia due to diabetes mellitus (HCC) Active Problems:   AKI (acute kidney injury) (HCC)   Hypotension   Type 2 diabetes mellitus (HCC)   Schizophrenia (HCC)   Obesity, Class III, BMI 40-49.9 (morbid obesity) (HCC)   Acute bronchitis   DKA (diabetic ketoacidoses) (HCC)   LOS: 3 days   A & P  DKA: patient closed gap and has been transitioned to sub Q lantus and SSI.   DM II: Uncontrolled. HbA1c on 10/16/2019 was 13.6. Pt lantus has been increased to 60 units bid with Resistent SSI. Monitor. FSBS over the past 24 hours have been 132-275.  AKI on CKD II: Creatinine at 0.95. Likely chronic and due to DM II and hypertension. Monitor creatinine, electrolytes, and volume status. Avoid nephrotoxic substances and hypotension.  Acute  bronchitis: Improving. The patient is receiving albuterol nebs.  Hypotension: Resolved.  Hyperlipidemia: Continue lipitor.  Essential Hypertension: Pt is now normotensive on Cozaar, norvasc, Propranolol  Schizophrenia: Continue Risperdal, latuda, Abilify, Elavil, Clonazepam, Lamictal, Desyrel  I have seen and examined this patient myself. I have spent 32 minutes in her evaluation and care.  DVT Prophylaxis: Lovenox CODE STATUS: Full Code Family Communication: None available Disposition: The patient is from home. Anticipate discharge to home. Barriers to discharge: Continued poor control of glucoses.  Status is: Inpatient  Remains inpatient appropriate because:Inpatient level of care appropriate due to severity of illness   Dispo: The patient is from: Home              Anticipated d/c is to: Home              Anticipated d/c date is: 1 day.              Patient currently is not medically stable to d/c.  [name], DO [facility] Direct contact: see [email or web address]  7PM-7AM contact night coverage as above 11/16/2019, 5:45 PM  LOS: 2 days

## 2019-11-16 NOTE — Progress Notes (Signed)
Patient blood glucose checked per MD's order. Maisie Fus, MD on call notified about the results and orders received in Epic. Will continue to monitor.   Traver Meckes,RN.

## 2019-11-16 NOTE — Plan of Care (Signed)
  Problem: Pain Managment: Goal: General experience of comfort will improve Outcome: Progressing   Problem: Safety: Goal: Ability to remain free from injury will improve Outcome: Progressing   

## 2019-11-16 NOTE — Progress Notes (Signed)
Called for uncontrolled sugars   Per chart patient is a 54 yr old woman who presented to [facility] on 11/12/2019 in DKA. She was admitted to a step down unit on a DKA protocol. She closed her gap and was transferred back to sub Q lantus and SSI. However, she continues to have poorly controlled glucoses. Her HbA1c as of 10/16/2019 was 13.6.   Fs increasing over night now 275 despite use of high sliding scale and starting lantus 60units bid, 1st dose given last pm.  Will cover with 10 unit novolog.  Recheck fs in 2-3 hours.  RN to notify for further needs , primary attn to follow in am

## 2019-11-16 NOTE — Plan of Care (Signed)
  Problem: Education: Goal: Knowledge of General Education information will improve Description Including pain rating scale, medication(s)/side effects and non-pharmacologic comfort measures Outcome: Progressing   

## 2019-11-17 LAB — GLUCOSE, CAPILLARY
Glucose-Capillary: 154 mg/dL — ABNORMAL HIGH (ref 70–99)
Glucose-Capillary: 141 mg/dL — ABNORMAL HIGH (ref 70–99)

## 2019-11-17 MED ORDER — ENSURE MAX PROTEIN PO LIQD: 11.0000 [oz_av] | Freq: Two times a day (BID) | ORAL | 0 refills | Status: DC

## 2019-11-17 MED ORDER — LANTUS SOLOSTAR 100 UNIT/ML ~~LOC~~ SOPN: 60.0000 [IU] | PEN_INJECTOR | Freq: Two times a day (BID) | SUBCUTANEOUS | 11 refills | Status: AC

## 2019-11-17 MED ORDER — ONDANSETRON HCL 8 MG PO TABS: 8.0000 mg | ORAL_TABLET | Freq: Three times a day (TID) | ORAL | 0 refills | Status: DC | PRN

## 2019-11-17 NOTE — Plan of Care (Signed)
  Problem: Education: Goal: Knowledge of General Education information will improve Description Including pain rating scale, medication(s)/side effects and non-pharmacologic comfort measures Outcome: Progressing   Problem: Health Behavior/Discharge Planning: Goal: Ability to manage health-related needs will improve Outcome: Progressing   

## 2019-11-17 NOTE — Discharge Summary (Signed)
Physician Discharge Summary  Monique Holland DJT:[id number] DOB: 1965-06-22 DOA: 11/12/2019  PCP: Knox Royalty, MD  Admit date: 11/12/2019 Discharge date: 11/17/2019  Recommendations for Outpatient Follow-up:  1. Discharge to home 2. Check finger-stick blood sugars twice daily. Once in the am before breakfast, and once in the evening before dinner. Record these numbers and take them in to your visit with your PCP. 3. Follow up with your PCP in 7-10 days.  Discharge Diagnoses: Principal diagnosis is #1 1. DKA 2. Uncontrolled DM II with hyperglycemia 3. AKI on CKD II 4. Acute bronchitis 5. Hypotension 6. Hyperlipidemia 7. Essential Hypertension 8. Schizophrenia  Discharge Condition: Fair  Disposition: Home  Diet recommendation: Heart healthy/Carbohydrate modified.  Filed Weights   11/12/19 1838 11/14/19 1507 11/17/19 0458  Weight: 123.4 kg 128.4 kg (!) 138.3 kg    History of present illness:  The patient is a 54 yr old woman who presented to [facility] on 11/12/2019 in DKA. She was admitted to a step down unit on a DKA protocol. She closed her gap and was transferred back to sub Q lantus and SSI. However, she continues to have poorly controlled glucoses. Her HbA1c as of 10/16/2019 was 13.6.   Hospital Course:  The patient was admitted to a step down bed. She was placed on a DKA protocol. She was restarted on lantus when her gap was closed. Her glucoses remained elevated and her insulin required a great deal of adjusting before they were in a safe range for discharge. The patient was discharged to home today in fair condition.  Today's assessment: S: The patient is resting comfortably. She states that she is feeling a little better everyday. O: Vitals:  Vitals:   11/17/19 0458 11/17/19 0830  BP: 100/69 127/80  Pulse: 80 86  Resp: 18 18  Temp: 98.5 F (36.9 C) 98.5 F (36.9 C)  SpO2: 100% 100%   Constitutional:   The patient is awake, alert, and oriented x 3. No acute  distress. Respiratory:   No increased work of breathing.  Lung sounds are distant  No wheezes, rales, or rhonchi  No tactile fremitus Cardiovascular:   Regular rate and rhythm  Heart sounds are distant.  No murmurs, ectopy, or gallups.  No lateral PMI. No thrills. Abdomen:   Abdomen is morbidly obese  Soft, non-tender, non-distended.  No hernias, masses, or organomegaly can be appreciated due to the patient's body habitus.  Bowel sounds are distant. Musculoskeletal:   No cyanosis, clubbing, or edema Skin:   No rashes, lesions, ulcers  palpation of skin: no induration or nodules Neurologic:   CN 2-12 intact  Sensation all 4 extremities intact Psychiatric:   Mental status ? Mood, affect appropriate ? Orientation to person, place, time   judgment and insight appear intact   Discharge Instructions  Discharge Instructions    Activity as tolerated - No restrictions   Complete by: As directed    Call MD for:  difficulty breathing, headache or visual disturbances   Complete by: As directed    Call MD for:  extreme fatigue   Complete by: As directed    Call MD for:  persistant dizziness or light-headedness   Complete by: As directed    Call MD for:  persistant nausea and vomiting   Complete by: As directed    Diet - low sodium heart healthy   Complete by: As directed    Diet Carb Modified   Complete by: As directed    Discharge instructions  Complete by: As directed    Discharge to home Check finger-stick blood sugars twice daily. Once in the am before breakfast, and once in the evening before dinner. Record these numbers and take them in to your visit with your PCP. Follow up with your PCP in 7-10 days.   Increase activity slowly   Complete by: As directed      Allergies as of 11/17/2019   No Known Allergies     Medication List    STOP taking these medications   doxycycline 100 MG capsule Commonly known as: VIBRAMYCIN   Premarin vaginal  cream Generic drug: conjugated estrogens     TAKE these medications   albuterol 108 (90 Base) MCG/ACT inhaler Commonly known as: VENTOLIN HFA Inhale 1-2 puffs into the lungs every 6 (six) hours as needed for wheezing or shortness of breath.   amitriptyline 75 MG tablet Commonly known as: ELAVIL Take 75 mg by mouth at bedtime. Notes to patient: Take at bedtime today   amLODipine 5 MG tablet Commonly known as: NORVASC Take 5 mg by mouth daily. Notes to patient: Next dose tomorrow 11/18/19   ARIPiprazole 2 MG tablet Commonly known as: ABILIFY Take 2 mg by mouth daily. Notes to patient: Next dose tomorrow  11/18/19   atorvastatin 40 MG tablet Commonly known as: LIPITOR Take 40 mg by mouth daily. Notes to patient: Next dose this evening 11/17/19   clonazePAM 0.5 MG tablet Commonly known as: KLONOPIN Take 0.5 mg by mouth daily.   diclofenac 75 MG EC tablet Commonly known as: VOLTAREN Take 75 mg by mouth 2 (two) times daily.   Ensure Max Protein Liqd Take 330 mLs (11 oz total) by mouth 2 (two) times daily.   Janumet 50-1000 MG tablet Generic drug: sitaGLIPtin-metformin Take 1 tablet by mouth 2 (two) times daily.   lamoTRIgine 200 MG tablet Commonly known as: LAMICTAL Take 200 mg by mouth daily.   Lantus SoloStar 100 UNIT/ML Solostar Pen Generic drug: insulin glargine Inject 60 Units into the skin 2 (two) times daily. What changed:   how much to take  when to take this   Latuda 80 MG Tabs tablet Generic drug: lurasidone Take 80 mg by mouth at bedtime.   losartan 25 MG tablet Commonly known as: COZAAR Take 25 mg by mouth daily.   ondansetron 8 MG tablet Commonly known as: ZOFRAN Take 1 tablet (8 mg total) by mouth every 8 (eight) hours as needed for nausea or vomiting.   Oxycodone HCl 10 MG Tabs Take 10 mg by mouth every 4 (four) hours as needed (pain).   pantoprazole 40 MG tablet Commonly known as: PROTONIX TAKE 1 TABLET (40 MG TOTAL) BY MOUTH 2 (TWO)  TIMES DAILY FOR 28 DAYS.   promethazine 6.25 MG/5ML syrup Commonly known as: PHENERGAN Take 5 mLs by mouth 2 (two) times daily as needed for nausea/vomiting.   propranolol 20 MG tablet Commonly known as: INDERAL Take 20 mg by mouth 2 (two) times daily.   risperiDONE 0.5 MG tablet Commonly known as: RISPERDAL Take 0.5 mg by mouth at bedtime.   traZODone 100 MG tablet Commonly known as: DESYREL Take 100 mg by mouth at bedtime.      No Known Allergies  The results of significant diagnostics from this hospitalization (including imaging, microbiology, ancillary and laboratory) are listed below for reference.    Significant Diagnostic Studies: DG Chest 2 View  Result Date: 11/12/2019 CLINICAL DATA:  Shortness of breath for 3 hours. EXAM: CHEST -  2 VIEW COMPARISON:  11/03/2019 FINDINGS: Improved lung volumes from prior exam. The cardiomediastinal contours are normal. Resolved left lung base atelectasis from prior. Pulmonary vasculature is normal. No consolidation, pleural effusion, or pneumothorax. No acute osseous abnormalities are seen. IMPRESSION: No acute chest findings. Improved lung volumes with resolved left lung base atelectasis from recent prior. Electronically Signed   By: Keith Rake M.D.   On: 11/12/2019 19:24   DG Chest Port 1 View  Result Date: 11/03/2019 CLINICAL DATA:  Respiratory difficulty EXAM: PORTABLE CHEST 1 VIEW COMPARISON:  03/23/2019 FINDINGS: Heart is normal size. Left base atelectasis. Right lung clear. No effusions. No acute bony abnormality. IMPRESSION: Left base atelectasis. Electronically Signed   By: Rolm Baptise M.D.   On: 11/03/2019 23:48    Microbiology: Recent Results (from the past 240 hour(s))  SARS Coronavirus 2 by RT PCR (hospital order, performed in [facility] hospital lab) Nasopharyngeal Nasopharyngeal Swab     Status: None   Collection Time: 11/12/19  9:32 PM   Specimen: Nasopharyngeal Swab  Result Value Ref Range Status   SARS  Coronavirus 2 NEGATIVE NEGATIVE Final    Comment: (NOTE) SARS-CoV-2 target nucleic acids are NOT DETECTED.  The SARS-CoV-2 RNA is generally detectable in upper and lower respiratory specimens during the acute phase of infection. The lowest concentration of SARS-CoV-2 viral copies this assay can detect is 250 copies / mL. A negative result does not preclude SARS-CoV-2 infection and should not be used as the sole basis for treatment or other patient management decisions.  A negative result may occur with improper specimen collection / handling, submission of specimen other than nasopharyngeal swab, presence of viral mutation(s) within the areas targeted by this assay, and inadequate number of viral copies (<250 copies / mL). A negative result must be combined with clinical observations, patient history, and epidemiological information.  Fact Sheet for Patients:   StrictlyIdeas.no  Fact Sheet for Healthcare Providers: BankingDealers.co.za  This test is not yet approved or  cleared by the [place] FDA and has been authorized for detection and/or diagnosis of SARS-CoV-2 by FDA under an Emergency Use Authorization (EUA).  This EUA will remain in effect (meaning this test can be used) for the duration of the COVID-19 declaration under Section 564(b)(1) of the Act, 21 U.S.C. section 360bbb-3(b)(1), unless the authorization is terminated or revoked sooner.  Performed at [facility], [address] [place], [place] [address]      Labs: Basic Metabolic Panel: Recent Labs  Lab 11/13/19 2025 11/14/19 0029 11/14/19 1526 11/15/19 0400 11/16/19 0223  NA 126* 131* 135 132* 136  K 4.5 3.8 3.6 4.1 3.7  CL 93* 99 102 100 104  CO2 20* 22 23 21* 22  GLUCOSE 521* 332* 111* 394* 301*  BUN 19 22* 25* 22* 17  CREATININE 1.21* 1.42* 1.18* 1.09* 0.95  CALCIUM 9.3 9.2 8.5* 8.1* 7.9*   Liver Function Tests: Recent Labs  Lab  11/12/19 1924  AST 27  ALT 24  ALKPHOS 84  BILITOT 0.9  PROT 8.3*  ALBUMIN 4.2   No results for input(s): LIPASE, AMYLASE in the last 168 hours. No results for input(s): AMMONIA in the last 168 hours. CBC: Recent Labs  Lab 11/12/19 1924 11/12/19 2125  WBC 9.4  --   NEUTROABS 7.5  --   HGB 11.8* 11.9*  HCT 39.1 35.0*  MCV 72.4*  --   PLT 343  --    Cardiac Enzymes: No results for input(s):  CKTOTAL, CKMB, CKMBINDEX, TROPONINI in the last 168 hours. BNP: BNP (last 3 results) Recent Labs    11/03/19 2358  BNP 26.3    ProBNP (last 3 results) No results for input(s): PROBNP in the last 8760 hours.  CBG: Recent Labs  Lab 11/16/19 1120 11/16/19 1636 11/16/19 2109 11/17/19 0643 11/17/19 1125  GLUCAP 137* 252* 253* 154* 141*    Principal Problem:   Hyperglycemia due to diabetes mellitus (HCC) Active Problems:   AKI (acute kidney injury) (HCC)   Hypotension   Type 2 diabetes mellitus (HCC)   Schizophrenia (HCC)   Obesity, Class III, BMI 40-49.9 (morbid obesity) (HCC)   Acute bronchitis   DKA (diabetic ketoacidoses) (HCC)   Time coordinating discharge: 38 minutes.  Signed:        Darius Lundberg, DO [facility]  11/17/2019, 6:05 PM

## 2019-11-17 NOTE — Progress Notes (Signed)
DISCHARGE NOTE [facility] to be discharged Home per MD order. Patient verbalized understanding.  Skin clean, dry and intact without evidence of skin break down, no evidence of skin tears noted. IV catheter discontinued intact. Site without signs and symptoms of complications. Dressing and pressure applied. Pt denies pain at the site currently. No complaints noted.  Patient free of lines, drains, and wounds.   Discharge packet assembled. An After Visit Summary (AVS) was printed and given to the patient. Patient escorted via wheelchair and discharged to home  via private auto.    Darvin NeighboursBonnita Hollow, RN

## 2019-11-18 ENCOUNTER — Encounter: Payer: Self-pay | Admitting: Nurse Practitioner

## 2019-11-27 ENCOUNTER — Other Ambulatory Visit: Payer: Self-pay

## 2019-11-27 ENCOUNTER — Observation Stay (HOSPITAL_BASED_OUTPATIENT_CLINIC_OR_DEPARTMENT_OTHER)
Admission: EM | Admit: 2019-11-27 | Discharge: 2019-11-29 | Disposition: A | Discharge status: Home / Self Care | Payer: Medicare Other | Attending: Internal Medicine | Admitting: Internal Medicine

## 2019-11-27 ENCOUNTER — Emergency Department (HOSPITAL_BASED_OUTPATIENT_CLINIC_OR_DEPARTMENT_OTHER): Payer: Medicare Other

## 2019-11-27 ENCOUNTER — Encounter (HOSPITAL_BASED_OUTPATIENT_CLINIC_OR_DEPARTMENT_OTHER): Payer: Self-pay

## 2019-11-27 DIAGNOSIS — E119 Type 2 diabetes mellitus without complications: Secondary | ICD-10-CM | POA: Diagnosis not present

## 2019-11-27 DIAGNOSIS — R200 Anesthesia of skin: Secondary | ICD-10-CM | POA: Insufficient documentation

## 2019-11-27 DIAGNOSIS — D509 Iron deficiency anemia, unspecified: Secondary | ICD-10-CM | POA: Insufficient documentation

## 2019-11-27 DIAGNOSIS — I10 Essential (primary) hypertension: Secondary | ICD-10-CM | POA: Insufficient documentation

## 2019-11-27 DIAGNOSIS — F319 Bipolar disorder, unspecified: Secondary | ICD-10-CM | POA: Insufficient documentation

## 2019-11-27 DIAGNOSIS — Z794 Long term (current) use of insulin: Secondary | ICD-10-CM | POA: Insufficient documentation

## 2019-11-27 DIAGNOSIS — E785 Hyperlipidemia, unspecified: Secondary | ICD-10-CM | POA: Diagnosis not present

## 2019-11-27 DIAGNOSIS — G4733 Obstructive sleep apnea (adult) (pediatric): Secondary | ICD-10-CM | POA: Diagnosis not present

## 2019-11-27 DIAGNOSIS — Z79899 Other long term (current) drug therapy: Secondary | ICD-10-CM | POA: Insufficient documentation

## 2019-11-27 DIAGNOSIS — Z20822 Contact with and (suspected) exposure to covid-19: Secondary | ICD-10-CM | POA: Insufficient documentation

## 2019-11-27 DIAGNOSIS — F449 Dissociative and conversion disorder, unspecified: Secondary | ICD-10-CM

## 2019-11-27 DIAGNOSIS — R531 Weakness: Secondary | ICD-10-CM | POA: Diagnosis not present

## 2019-11-27 LAB — DIFFERENTIAL
Abs Immature Granulocytes: 0.02 10*3/uL (ref 0.00–0.07)
Basophils Absolute: 0 10*3/uL (ref 0.0–0.1)
Basophils Relative: 1 %
Eosinophils Absolute: 0.2 10*3/uL (ref 0.0–0.5)
Eosinophils Relative: 3 %
Immature Granulocytes: 0 %
Lymphocytes Relative: 37 %
Lymphs Abs: 2.3 10*3/uL (ref 0.7–4.0)
Monocytes Absolute: 0.6 10*3/uL (ref 0.1–1.0)
Monocytes Relative: 9 %
Neutro Abs: 3.2 10*3/uL (ref 1.7–7.7)
Neutrophils Relative %: 50 %

## 2019-11-27 LAB — COMPREHENSIVE METABOLIC PANEL
ALT: 16 U/L (ref 0–44)
AST: 22 U/L (ref 15–41)
Albumin: 3.6 g/dL (ref 3.5–5.0)
Alkaline Phosphatase: 50 U/L (ref 38–126)
Anion gap: 12 (ref 5–15)
BUN: 19 mg/dL (ref 6–20)
CO2: 26 mmol/L (ref 22–32)
Calcium: 8.8 mg/dL — ABNORMAL LOW (ref 8.9–10.3)
Chloride: 98 mmol/L (ref 98–111)
Creatinine, Ser: 1.05 mg/dL — ABNORMAL HIGH (ref 0.44–1.00)
GFR calc Af Amer: >60 mL/min (ref >60)
GFR calc non Af Amer: >60 mL/min (ref >60)
Glucose, Bld: 217 mg/dL — ABNORMAL HIGH (ref 70–99)
Potassium: 4.1 mmol/L (ref 3.5–5.1)
Sodium: 136 mmol/L (ref 135–145)
Total Bilirubin: 0.3 mg/dL (ref 0.3–1.2)
Total Protein: 6.9 g/dL (ref 6.5–8.1)

## 2019-11-27 LAB — CBC
HCT: 32.7 % — ABNORMAL LOW (ref 36.0–46.0)
Hemoglobin: 9.5 g/dL — ABNORMAL LOW (ref 12.0–15.0)
MCH: 22.1 pg — ABNORMAL LOW (ref 26.0–34.0)
MCHC: 29.1 g/dL — ABNORMAL LOW (ref 30.0–36.0)
MCV: 76.2 fL — ABNORMAL LOW (ref 80.0–100.0)
Platelets: 206 10*3/uL (ref 150–400)
RBC: 4.29 MIL/uL (ref 3.87–5.11)
RDW: 18.1 % — ABNORMAL HIGH (ref 11.5–15.5)
WBC: 6.4 10*3/uL (ref 4.0–10.5)
nRBC: 0 % (ref 0.0–0.2)

## 2019-11-27 LAB — CBG MONITORING, ED: Glucose-Capillary: 198 mg/dL — ABNORMAL HIGH (ref 70–99)

## 2019-11-27 LAB — SARS CORONAVIRUS 2 BY RT PCR (HOSPITAL ORDER, PERFORMED IN ~~LOC~~ HOSPITAL LAB): SARS Coronavirus 2: NEGATIVE

## 2019-11-27 LAB — PROTIME-INR
INR: 0.9 (ref 0.8–1.2)
Prothrombin Time: 12.2 seconds (ref 11.4–15.2)

## 2019-11-27 LAB — APTT: aPTT: 28 seconds (ref 24–36)

## 2019-11-27 IMAGING — CT CT HEAD CODE STROKE
3 series · 14 of 47 positions shown, 16 images · non-contrast
Comparison: None.

CLINICAL DATA: Code stroke.  Right-sided numbness

EXAM:
CT HEAD WITHOUT CONTRAST
TECHNIQUE: Contiguous axial images were obtained from the base of the skull
through the vertex without intravenous contrast.

[Series 2: head wo · axial · 0.43mm/px · z∈[+1087,+1212]mm · 8 of 31 slices shown, 10 images]
[im 3/31  brain]
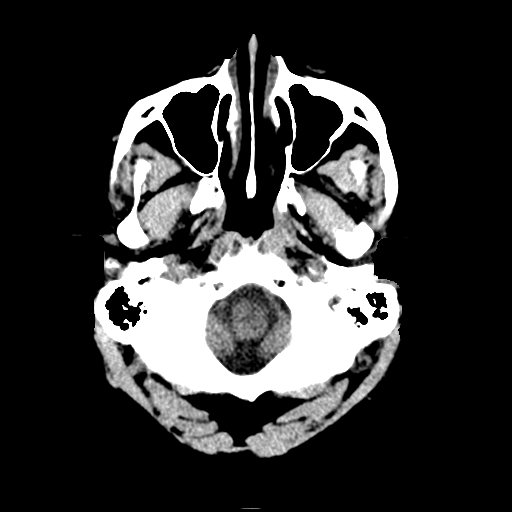
[im 3/31  bone]
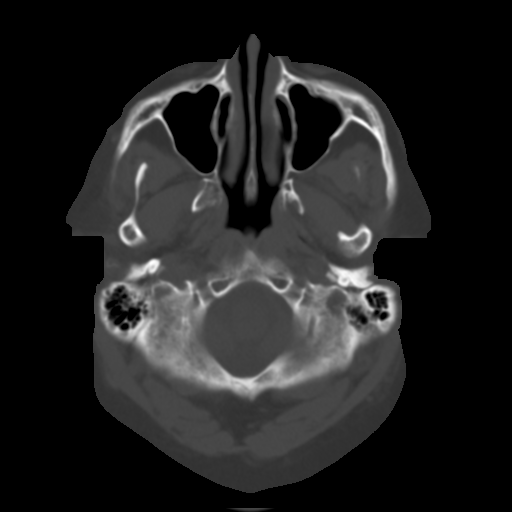
[im 7/31  brain]
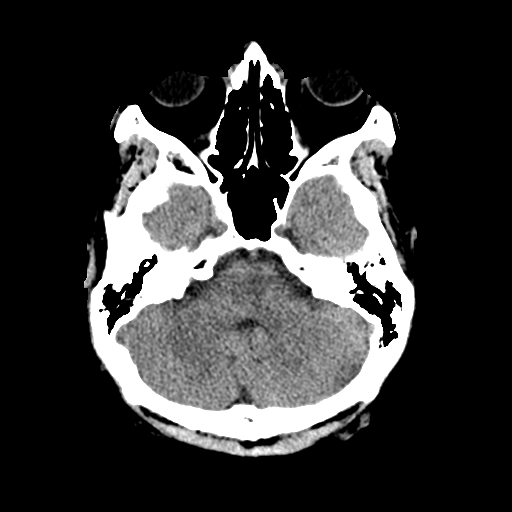
[im 10/31  brain]
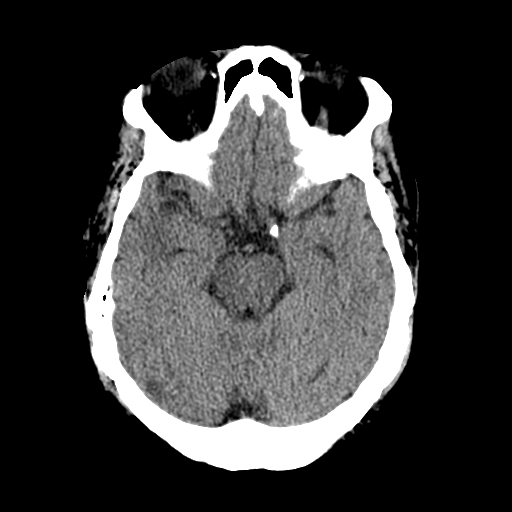
[im 14/31  brain]
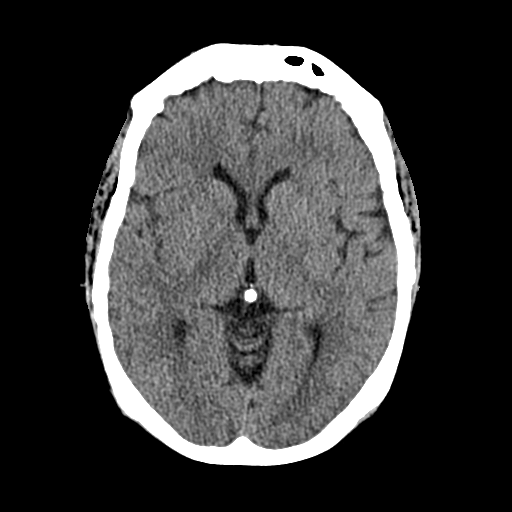
[im 17/31  brain]
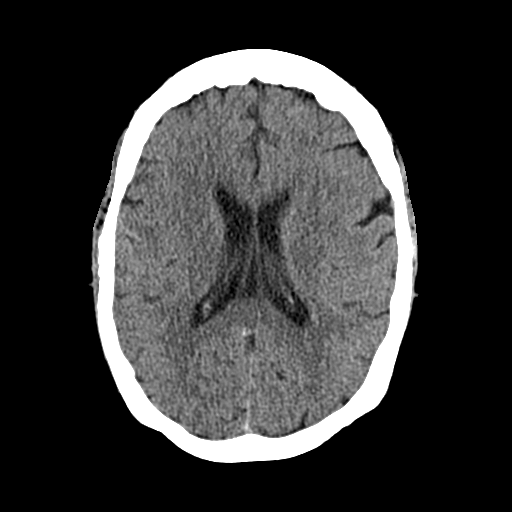
[im 17/31  bone]
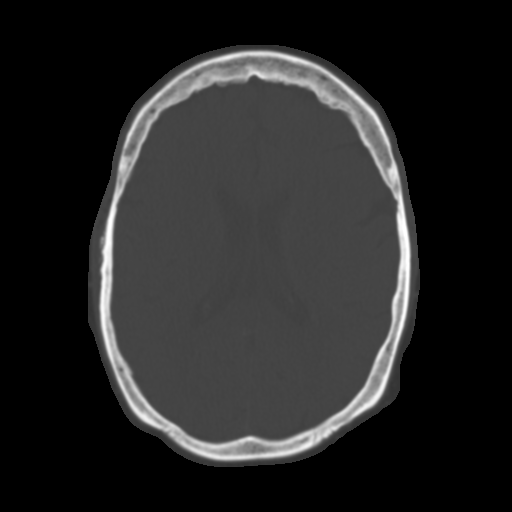
[im 21/31  brain]
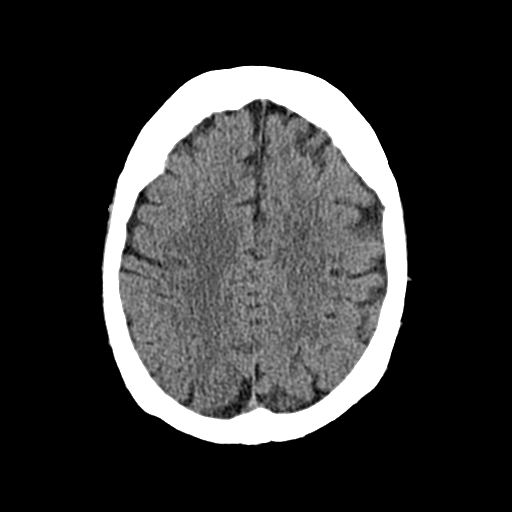
[im 24/31  brain]
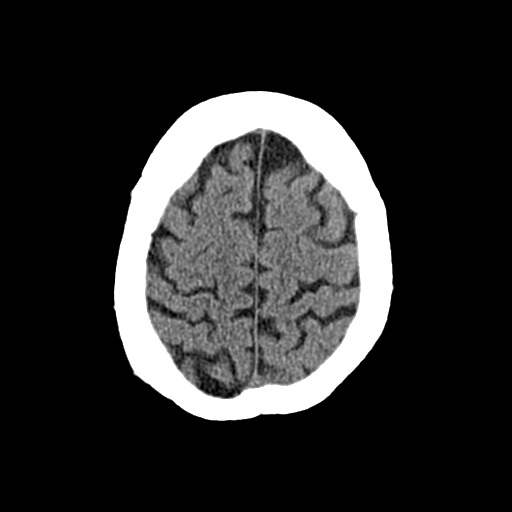
[im 28/31  brain]
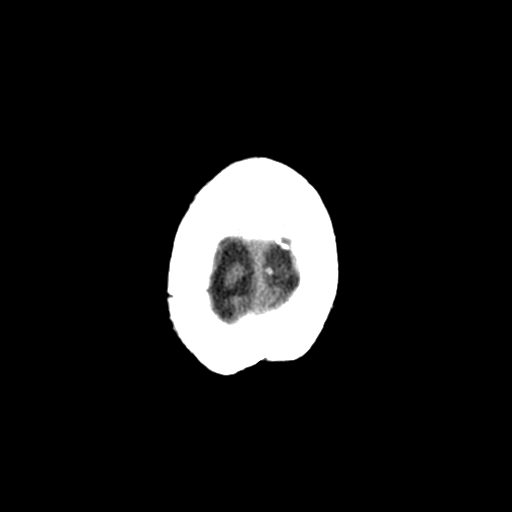

[Series 4: cor soft · coronal · 0.31mm/px · 3 of 77 slices shown]
[im 26/77  brain]
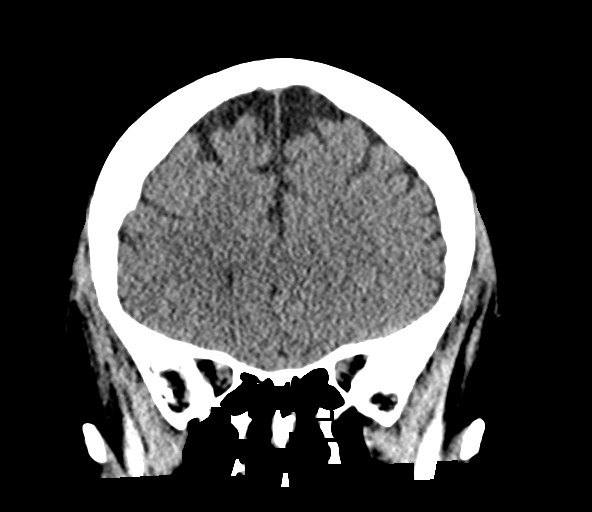
[im 34/77  brain]
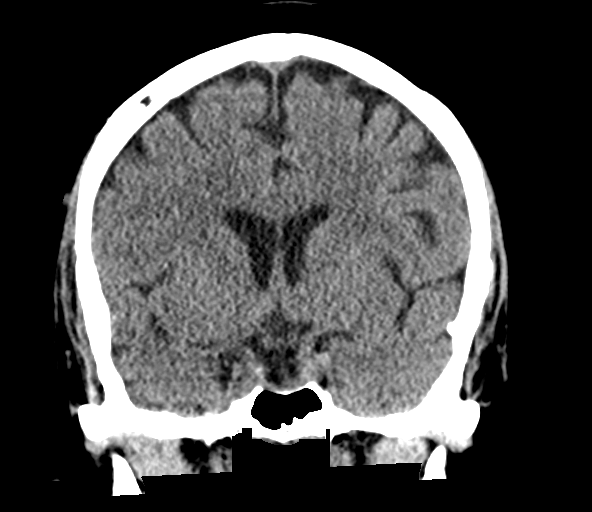
[im 43/77  brain]
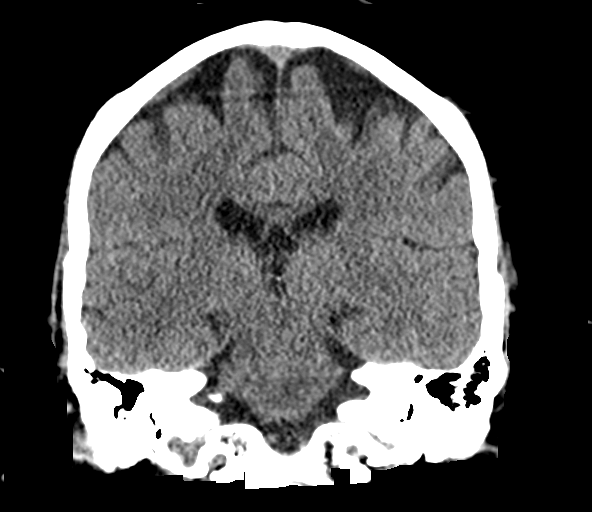

[Series 5: sag soft · sagittal · 0.30mm/px · 3 of 66 slices shown]
[im 22/66  brain]
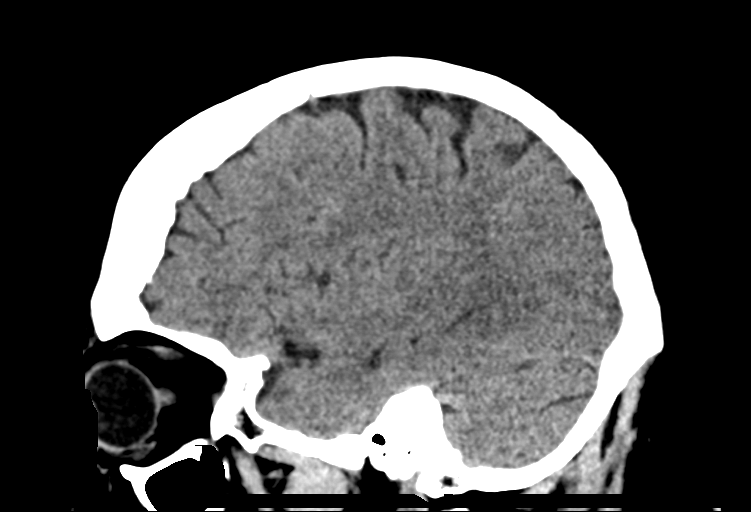
[im 33/66  brain]
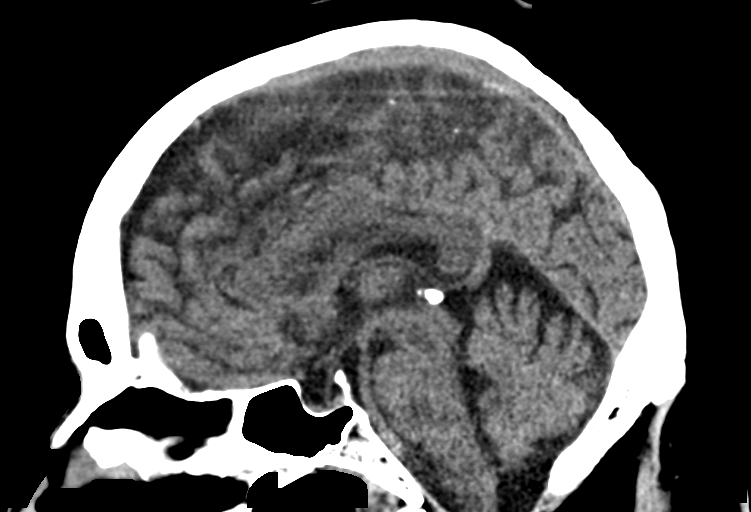
[im 44/66  brain]
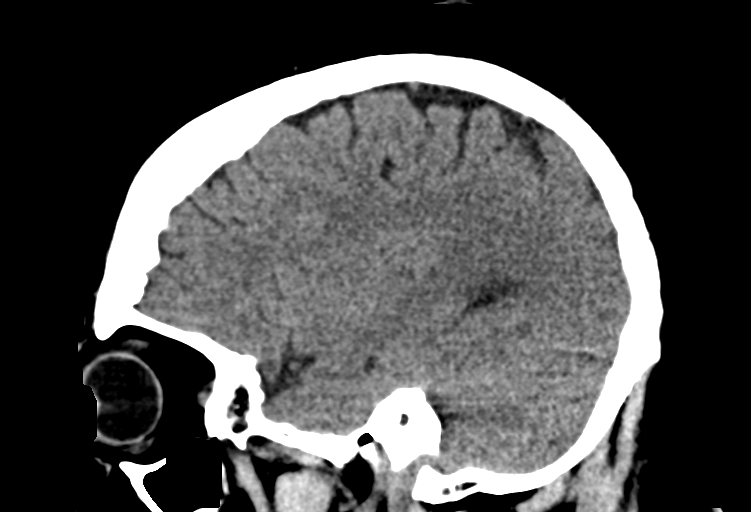

[14 of 47 positions shown; findings below may reference images not displayed]

FINDINGS: Brain: There is no mass, hemorrhage or extra-axial collection. The
size and configuration of the ventricles and extra-axial CSF spaces
are normal. The brain parenchyma is normal, without evidence of
acute or chronic infarction.

Vascular: No abnormal hyperdensity of the major intracranial
arteries or dural venous sinuses. No intracranial atherosclerosis.

Skull: The visualized skull base, calvarium and extracranial soft
tissues are normal.

Sinuses/Orbits: No fluid levels or advanced mucosal thickening of
the visualized paranasal sinuses. No mastoid or middle ear effusion.
The orbits are normal.

ASPECTS (Alberta Stroke Program Early CT Score)

- Ganglionic level infarction (caudate, lentiform nuclei, internal
capsule, insula, M1-M3 cortex): 7

- Supraganglionic infarction (M4-M6 cortex): 3

Total score (0-10 with 10 being normal): 10
IMPRESSION: 1. Normal head CT.
2. ASPECTS is 10.
3. These results were called by telephone at the time of
interpretation on [DATE] at [DATE] to provider ZAYENU
ZAYENU , who verbally acknowledged these results.

## 2019-11-27 IMAGING — CT CT ANGIO HEAD-NECK
1 of 11 series · 3 of 33 positions shown · IV contrast (Omnipaque)
Comparison: [DATE] CTA head neck

CLINICAL DATA: Right-sided numbness

EXAM:
CT ANGIOGRAPHY HEAD AND NECK
TECHNIQUE: Multidetector CT imaging of the head and neck was performed using
the standard protocol during bolus administration of intravenous
contrast. Multiplanar CT image reconstructions and MIPs were
obtained to evaluate the vascular anatomy. Carotid stenosis
measurements (when applicable) are obtained utilizing NASCET
criteria, using the distal internal carotid diameter as the
denominator.
CONTRAST:  100mL OMNIPAQUE IOHEXOL 350 MG/ML SOLN

[Series 8: axial thin · axial · 0.42mm/px · z∈[+995,+1333]mm · 3 of 339 slices shown]
[im 1/339  soft-tissue]
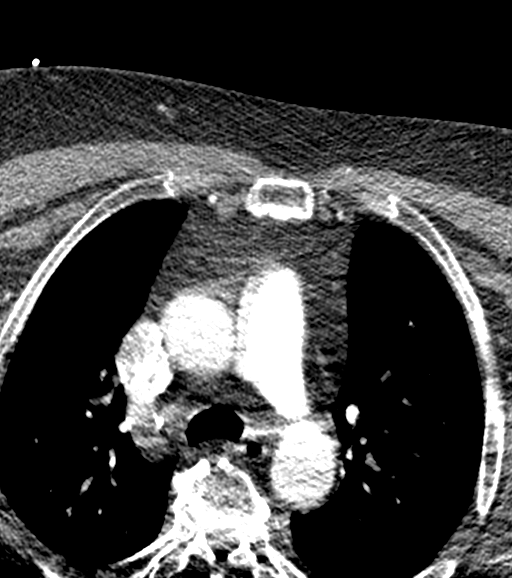
[im 170/339  bone]
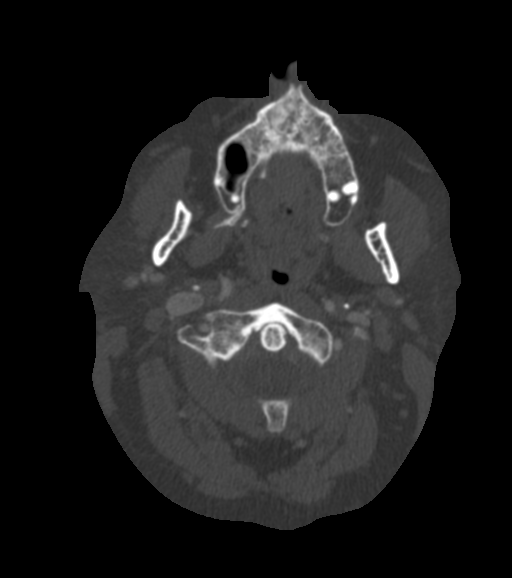
[im 339/339  soft-tissue]
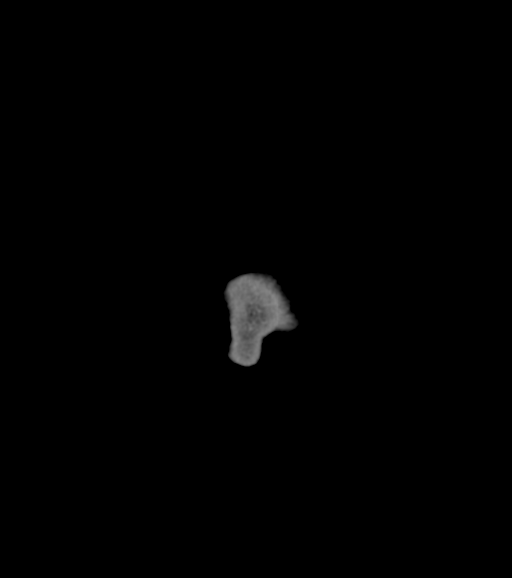

[3 of 33 positions shown; findings below may reference images not displayed]

FINDINGS: CTA NECK FINDINGS

SKELETON: There is no bony spinal canal stenosis. No lytic or
blastic lesion.

OTHER NECK: Normal pharynx, larynx and major salivary glands. No
cervical lymphadenopathy. Unremarkable thyroid gland.

UPPER CHEST: No pneumothorax or pleural effusion. No nodules or
masses.

AORTIC ARCH:

There is no calcific atherosclerosis of the aortic arch. There is no
aneurysm, dissection or hemodynamically significant stenosis of the
visualized portion of the aorta. Conventional 3 vessel aortic
branching pattern. The visualized proximal subclavian arteries are
widely patent.

RIGHT CAROTID SYSTEM: No dissection, occlusion or aneurysm. Mild
atherosclerotic calcification at the carotid bifurcation without
hemodynamically significant stenosis.

LEFT CAROTID SYSTEM: Normal without aneurysm, dissection or
stenosis.

VERTEBRAL ARTERIES: Left dominant configuration. Both origins are
clearly patent. There is no dissection, occlusion or flow-limiting
stenosis to the skull base (V1-V3 segments).

CTA HEAD FINDINGS

POSTERIOR CIRCULATION:

--Vertebral arteries: Normal V4 segments.

--Inferior cerebellar arteries: Normal.

--Basilar artery: Normal.

--Superior cerebellar arteries: Normal.

--Posterior cerebral arteries (PCA): Normal.

ANTERIOR CIRCULATION:

--Intracranial internal carotid arteries: Normal.

--Anterior cerebral arteries (ACA): Normal. Both A1 segments are
present. Patent anterior communicating artery (a-comm).

--Middle cerebral arteries (MCA): Normal.

VENOUS SINUSES: As permitted by contrast timing, patent.

ANATOMIC VARIANTS: None

Review of the MIP images confirms the above findings.
IMPRESSION: 1. No emergent large vessel occlusion or high-grade stenosis of the
intracranial or cervical arteries.
2. Mild right carotid bifurcation atherosclerotic calcification
without hemodynamically significant stenosis by NASCET criteria.

## 2019-11-27 IMAGING — CT CT ANGIO HEAD-NECK
1 of 8 series · 6 of 33 positions shown · IV contrast (Omnipaque)
Comparison: [DATE] CTA head neck

CLINICAL DATA: Right-sided numbness

EXAM:
CT ANGIOGRAPHY HEAD AND NECK
TECHNIQUE: Multidetector CT imaging of the head and neck was performed using
the standard protocol during bolus administration of intravenous
contrast. Multiplanar CT image reconstructions and MIPs were
obtained to evaluate the vascular anatomy. Carotid stenosis
measurements (when applicable) are obtained utilizing NASCET
criteria, using the distal internal carotid diameter as the
denominator.
CONTRAST:  100mL OMNIPAQUE IOHEXOL 350 MG/ML SOLN

[Series 8: axial thin · axial · 0.42mm/px · z∈[+1043,+1284]mm · 6 of 339 slices shown]
[im 49/339  soft-tissue]
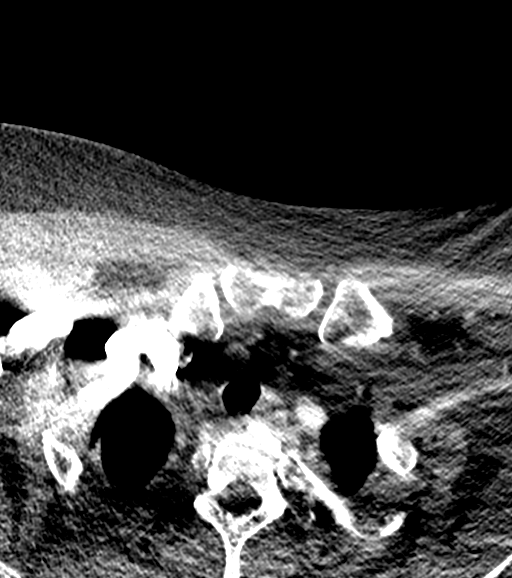
[im 97/339  bone]
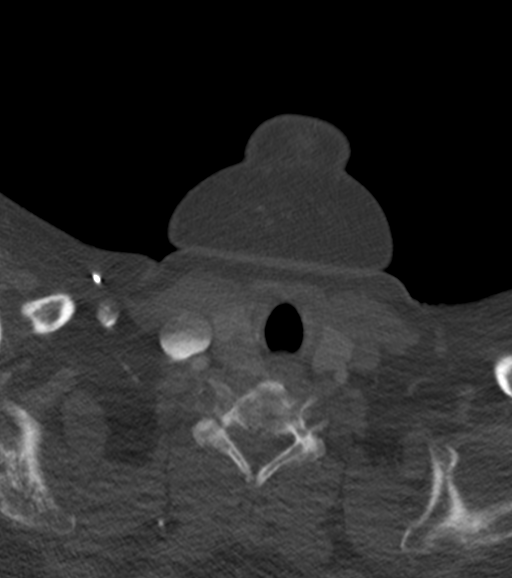
[im 145/339  soft-tissue]
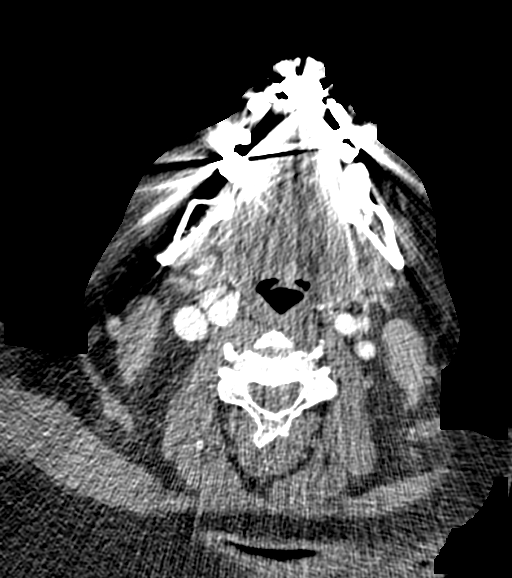
[im 194/339  bone]
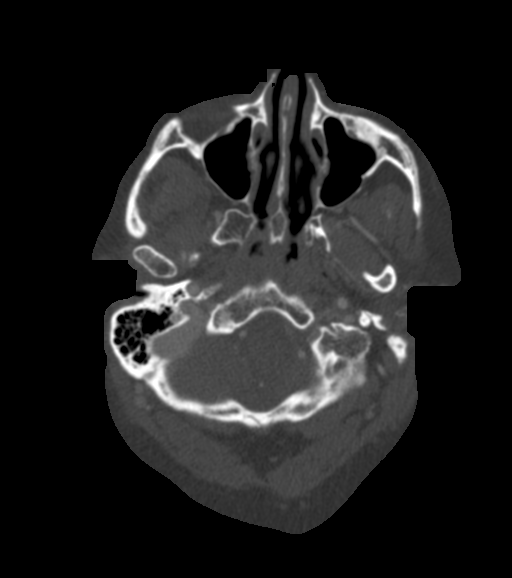
[im 242/339  soft-tissue]
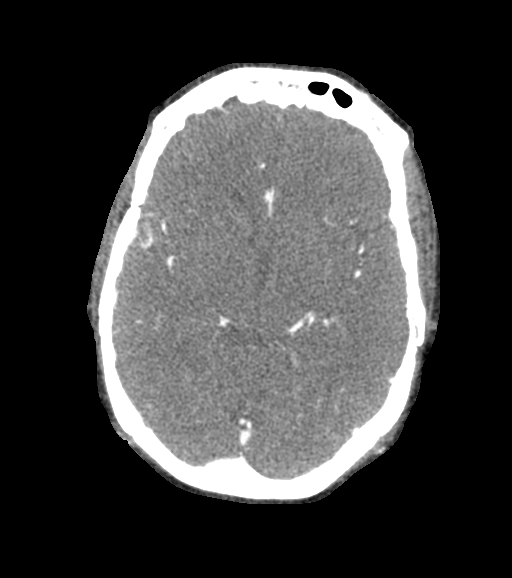
[im 290/339  bone]
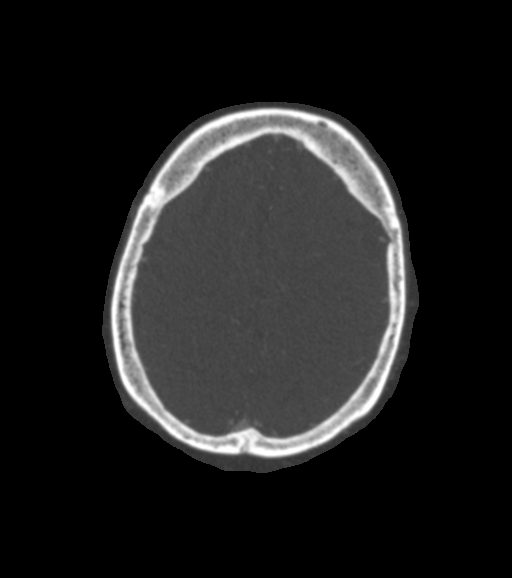

[6 of 33 positions shown; findings below may reference images not displayed]

FINDINGS: CTA NECK FINDINGS

SKELETON: There is no bony spinal canal stenosis. No lytic or
blastic lesion.

OTHER NECK: Normal pharynx, larynx and major salivary glands. No
cervical lymphadenopathy. Unremarkable thyroid gland.

UPPER CHEST: No pneumothorax or pleural effusion. No nodules or
masses.

AORTIC ARCH:

There is no calcific atherosclerosis of the aortic arch. There is no
aneurysm, dissection or hemodynamically significant stenosis of the
visualized portion of the aorta. Conventional 3 vessel aortic
branching pattern. The visualized proximal subclavian arteries are
widely patent.

RIGHT CAROTID SYSTEM: No dissection, occlusion or aneurysm. Mild
atherosclerotic calcification at the carotid bifurcation without
hemodynamically significant stenosis.

LEFT CAROTID SYSTEM: Normal without aneurysm, dissection or
stenosis.

VERTEBRAL ARTERIES: Left dominant configuration. Both origins are
clearly patent. There is no dissection, occlusion or flow-limiting
stenosis to the skull base (V1-V3 segments).

CTA HEAD FINDINGS

POSTERIOR CIRCULATION:

--Vertebral arteries: Normal V4 segments.

--Inferior cerebellar arteries: Normal.

--Basilar artery: Normal.

--Superior cerebellar arteries: Normal.

--Posterior cerebral arteries (PCA): Normal.

ANTERIOR CIRCULATION:

--Intracranial internal carotid arteries: Normal.

--Anterior cerebral arteries (ACA): Normal. Both A1 segments are
present. Patent anterior communicating artery (a-comm).

--Middle cerebral arteries (MCA): Normal.

VENOUS SINUSES: As permitted by contrast timing, patent.

ANATOMIC VARIANTS: None

Review of the MIP images confirms the above findings.
IMPRESSION: 1. No emergent large vessel occlusion or high-grade stenosis of the
intracranial or cervical arteries.
2. Mild right carotid bifurcation atherosclerotic calcification
without hemodynamically significant stenosis by NASCET criteria.

## 2019-11-27 MED ORDER — IOHEXOL 350 MG/ML SOLN
100.0000 mL | Freq: Once | INTRAVENOUS | Status: AC | PRN
  Administered 2019-11-27: 100 mL via INTRAVENOUS

## 2019-11-27 MED ORDER — ALTEPLASE 100 MG IV SOLR
INTRAVENOUS | Status: AC
  Filled 2019-11-27: qty 100

## 2019-11-27 NOTE — ED Notes (Signed)
2 MONTHS AGO HAD SAME SYMPTOMS, REC TPA, STATES IMPROVEMENT OCCURRED

## 2019-11-27 NOTE — ED Notes (Signed)
Patient transported to CT 

## 2019-11-27 NOTE — Consult Note (Addendum)
[facility] Tele[facility]urology Consult Services   Date of Service:   11/27/2019 09:32:67  Impression:     .  I63.9 - Cerebrovascular accident (CVA), unspecified mechanism (HCC)  Comments/Sign-Out: significant right arm/leg weakness and numbness with decreased sensation in the right face also- concerning for left hemispheric stroke. Given this has happened before, other etiologies include complicated migraine (less likely given lack of headache), seizure, or non-organic cause.  Metrics: Last Known Well: 11/27/2019 15:00:00 [facility] Notification Time: 11/27/2019 12:45:80 Arrival Time: 11/27/2019 19:25:00 Stamp Time: 11/27/2019 99:83:38 Time First Login Attempt: 11/27/2019 20:13:00 Symptoms: right sided weakness, numbness NIHSS Start Assessment Time: 11/27/2019 20:16:13 Alteplase Early Mix Decision Time: 11/27/2019 20:21:00 Patient is not a candidate for Alteplase/Activase. Alteplase Medical Decision: 11/27/2019 20:21:00 Patient was not deemed candidate for Alteplase/Activase thrombolytics because of following reasons: Last Well Known Above 4.5 Hours.  CT head showed no acute hemorrhage or acute core infarct.  ED Physician notified of diagnostic impression and management plan on 11/27/2019 20:32:00  Advanced Imaging: CTA Head and Neck Completed.  CTP Completed.  LVO:No   Our recommendations are outlined below.  Recommendations:     .  Activate Stroke Protocol Admission/Order Set     .  Stroke/Telemetry Floor     .  Neuro Checks     .  Bedside Swallow Eval     .  DVT Prophylaxis     .  IV Fluids, Normal Saline     .  Head of Bed 30 Degrees     .  Euglycemia and Avoid Hyperthermia (PRN Acetaminophen)     .  Antiplatelet Therapy Recommended  Routine Consultation with Inhouse Neurology for Follow up Care  Sign Out:     .  Discussed with Emergency Department  Provider    ------------------------------------------------------------------------------  History of Present Illness: Patient is a 54 year old Female.  Patient was brought by private transportation with symptoms of right sided weakness, numbness  54 year old woman who presents with right numbness and weakness. The symptoms began around 1500 while watching television. [place] court started on the tv at 1500 and within 10 minutes, the symptoms began. Since they started, they have gotten worse. She had a similar episode in May, received alteplase and MRI was negative. She denies any headache. Of note, even though she initially said when she arrived (before I came on screen) that the symptoms began at 1600, she then changed her mind and said it was more like 1500. I double checked and she consistently said it was around 1500 that the symptoms began.    Anticoagulant use:  No  Antiplatelet use: No     Examination: BP(114/82), Pulse(90), Blood Glucose(217) 1A: Level of Consciousness - Alert; keenly responsive + 0 1B: Ask Month and Age - Both Questions Right + 0 1C: Blink Eyes & Squeeze Hands - Performs Both Tasks + 0 2: Test Horizontal Extraocular Movements - Normal + 0 3: Test Visual Fields - No Visual Loss + 0 4: Test Facial Palsy (Use Grimace if Obtunded) - Normal symmetry + 0 5A: Test Left Arm Motor Drift - No Drift for 10 Seconds + 0 5B: Test Right Arm Motor Drift - No Movement + 4 6A: Test Left Leg Motor Drift - No Drift for 5 Seconds + 0 6B: Test Right Leg Motor Drift - No Effort Against Gravity + 3 7: Test Limb Ataxia (FNF/Heel-Shin) - No Ataxia + 0 8: Test Sensation - Complete Loss: Cannot Sense Being Touched At All + 2  9: Test Language/Aphasia - Normal; No aphasia + 0 10: Test Dysarthria - Normal + 0 11: Test Extinction/Inattention - No abnormality + 0  NIHSS Score: 9  Pre-Morbid Modified Ranking Scale: 0 Points = No symptoms at all   Patient/Family was informed the  Neurology Consult would occur via TeleHealth consult by way of interactive audio and video telecommunications and consented to receiving care in this manner.   Patient is being evaluated for possible acute neurologic impairment and high probability of imminent or life-threatening deterioration. I spent total of 25 minutes providing care to this patient, including time for face to face visit via telemedicine, review of medical records, imaging studies and discussion of findings with providers, the patient and/or family.   Dr Vena Austria   [facility] [phone]  Case [id number]

## 2019-11-27 NOTE — ED Notes (Signed)
PT STATES HER SYMPTOMS ACTUALLY BEGAN AT 1500HRS (NEURO MD ON TELE SYSTEM NOW) MD IS AWARE

## 2019-11-27 NOTE — ED Triage Notes (Signed)
Pt c/o "no feeling" to right UE and right LE since 4pm-denies injury-NAD-to triage in w/c

## 2019-11-27 NOTE — ED Notes (Signed)
EDP at bedside performing neuro exam, states she has no feeling on Rt side of body. Speech WNL, alert and oriented.

## 2019-11-27 NOTE — ED Provider Notes (Signed)
[facility] EMERGENCY DEPARTMENT Provider Note   CSN: [id number] Arrival date & time: 11/27/19  1925     History Chief Complaint  Patient presents with  . Numbness    Monique Holland is a 54 y.o. female.  The history is provided by the patient and medical records. No language interpreter was used.  Neurologic Problem This is a recurrent problem. The current episode started 3 to 5 hours ago. The problem occurs constantly. The problem has not changed since onset.Pertinent negatives include no chest pain, no abdominal pain, no headaches and no shortness of breath. Nothing aggravates the symptoms. Nothing relieves the symptoms. She has tried nothing for the symptoms. The treatment provided no relief.       Past Medical History:  Diagnosis Date  . Anxiety   . Asthma   . Bipolar disorder (HCC)   . Depression   . Diabetes mellitus without complication (HCC)   . Frequency of urination   . GERD (gastroesophageal reflux disease)   . History of asthma    last episode yrs ago  . Hypertension   . OSA (obstructive sleep apnea)    pt had study done oct 2014--  schedule for cpap titrate after kidney stone surgery  . Pancreatitis   . Pseudocyst of pancreas   . Schizophrenia (HCC)   . Ureteral calculi    BILATERAL  . Wears glasses     Patient Active Problem List   Diagnosis Date Noted  . Acute bronchitis 11/13/2019  . Hyperglycemia due to diabetes mellitus (HCC) 11/13/2019  . DKA (diabetic ketoacidoses) (HCC) 11/13/2019  . Prolonged QT interval 10/17/2019  . Pancreatitis 10/16/2019  . Hyperlipidemia 10/01/2019  . OSA (obstructive sleep apnea) 10/01/2019  . GERD (gastroesophageal reflux disease) 10/01/2019  . Right sided weakness 09/29/2019  . Stroke-like episode s/p tPA 09/29/2019  . History of pancreatitis   . Intractable abdominal pain 09/15/2019  . Hyperosmolar hyperglycemic state (HHS) (HCC) 09/15/2019  . Non-intractable vomiting   . Hematemesis with nausea     . Abdominal pain 08/03/2019  . Essential hypertension 08/03/2019  . Pancreatic pseudocyst   . Nausea and vomiting in adult   . Gastritis and gastroduodenitis   . Schizophrenia (HCC) 09/02/2018  . Obesity, Class III, BMI 40-49.9 (morbid obesity) (HCC) 09/02/2018  . Iron deficiency 09/02/2018  . Symptomatic anemia 09/01/2018  . Hypokalemia 09/01/2018  . AKI (acute kidney injury) (HCC) 08/11/2018  . Hypotension 08/11/2018  . Type 2 diabetes mellitus (HCC) 08/11/2018  . Ureteral calculus, right 05/02/2013    Past Surgical History:  Procedure Laterality Date  . BALLOON DILATION N/A 08/23/2019   Procedure: BALLOON DILATION;  Surgeon: Meridee Score Netty Starring., MD;  Location: [facility];  Service: Gastroenterology;  Laterality: N/A;  . BIOPSY  07/18/2019   Procedure: BIOPSY;  Surgeon: Shellia Cleverly, DO;  Location: [facility];  Service: Gastroenterology;;  . BIOPSY  08/23/2019   Procedure: BIOPSY;  Surgeon: Lemar Lofty., MD;  Location: [facility];  Service: Gastroenterology;;  . BIOPSY  09/18/2019   Procedure: BIOPSY;  Surgeon: Lemar Lofty., MD;  Location: [facility];  Service: Gastroenterology;;  . BIOPSY  10/18/2019   Procedure: BIOPSY;  Surgeon: Lemar Lofty., MD;  Location: [facility];  Service: Gastroenterology;;  . CESAREAN SECTION  1991  &  2002   w/ bilateral tubal ligation in 2002  . CYSTOSCOPY W/ URETERAL STENT PLACEMENT Right 08/10/2018   Procedure: CYSTOSCOPY WITH RETROGRADE PYELOGRAM/URETERAL STENT PLACEMENT;  Surgeon: Jerilee Field, MD;  Location: [facility];  Service: Urology;  Laterality: Right;  . CYSTOSCOPY WITH RETROGRADE PYELOGRAM, URETEROSCOPY AND STENT PLACEMENT Bilateral 05/02/2013   Procedure: CYSTOSCOPY WITH RETROGRADE PYELOGRAM, URETEROSCOPY ;  Surgeon: Valetta Fuller, MD;  Location[facility];  Service: Urology;  Laterality: Bilateral;  . CYSTOSCOPY WITH RETROGRADE PYELOGRAM, URETEROSCOPY AND STENT PLACEMENT  Right 08/15/2018   Procedure: CYSTOSCOPY WITH RIGHT RETROGRADE PYELOGRAM, RIGHT URETEROSCOPY WITH HOLMIUM LASER AND STENT PLACEMENT;  Surgeon: Crista Elliot, MD;  Location: [facility];  Service: Urology;  Laterality: Right;  . CYSTOSCOPY WITH STENT PLACEMENT Left 05/02/2013   Procedure: CYSTOSCOPY WITH STENT PLACEMENT;  Surgeon: Valetta Fuller, MD;  Location: [facility];  Service: Urology;  Laterality: Left;  . ENDOROTOR N/A 08/26/2019   Procedure: [id number];  Surgeon: Mansouraty, Netty Starring., MD;  Location: [facility] END[facility];  Service: Gastroenterology;  Laterality: N/A;  . ENDOROTOR  09/05/2019   Procedure: [id number];  Surgeon: Mansouraty, Netty Starring., MD;  Location: [facility] ENDOSCOPY;  Service: Gastroenterology;;  . ESOPHAGOGASTRODUODENOSCOPY N/A 08/26/2019   Procedure: ESOPHAGOGASTRODUODENOSCOPY (EGD) with NECROSECTOMY;  Surgeon: Lemar Lofty., MD;  Location: [facility] ENDOSCOPY;  Service: Gastroenterology;  Laterality: N/A;  . ESOPHAGOGASTRODUODENOSCOPY N/A 10/18/2019   Procedure: ESOPHAGOGASTRODUODENOSCOPY (EGD);  Surgeon: Lemar Lofty., MD;  Location: [facility];  Service: Gastroenterology;  Laterality: N/A;  . ESOPHAGOGASTRODUODENOSCOPY (EGD) WITH PROPOFOL N/A 07/18/2019   Procedure: ESOPHAGOGASTRODUODENOSCOPY (EGD) WITH PROPOFOL;  Surgeon: Shellia Cleverly, DO;  Location: [facility];  Service: Gastroenterology;  Laterality: N/A;  . ESOPHAGOGASTRODUODENOSCOPY (EGD) WITH PROPOFOL N/A 08/18/2019   Procedure: ESOPHAGOGASTRODUODENOSCOPY (EGD) WITH PROPOFOL;  Surgeon: Hilarie Fredrickson, MD;  Location: [facility];  Service: Endoscopy;  Laterality: N/A;  . ESOPHAGOGASTRODUODENOSCOPY (EGD) WITH PROPOFOL N/A 08/21/2019   Procedure: ESOPHAGOGASTRODUODENOSCOPY (EGD) WITH PROPOFOL;  Surgeon: Meridee Score Netty Starring., MD;  Location: [facility];  Service: Gastroenterology;  Laterality: N/A;  . ESOPHAGOGASTRODUODENOSCOPY (EGD) WITH PROPOFOL N/A 08/23/2019   Procedure:  ESOPHAGOGASTRODUODENOSCOPY (EGD) WITH PROPOFOL;  Surgeon: Meridee Score Netty Starring., MD;  Location: [facility];  Service: Gastroenterology;  Laterality: N/A;  . ESOPHAGOGASTRODUODENOSCOPY (EGD) WITH PROPOFOL N/A 09/05/2019   Procedure: ESOPHAGOGASTRODUODENOSCOPY (EGD) WITH PROPOFOL;  Surgeon: Meridee Score Netty Starring., MD;  Location: [facility] ENDOSCOPY;  Service: Gastroenterology;  Laterality: N/A;   With pancreatic pseudocyst necrosectomy via established cyst gastrostomy  . ESOPHAGOGASTRODUODENOSCOPY (EGD) WITH PROPOFOL N/A 09/18/2019   Procedure: ESOPHAGOGASTRODUODENOSCOPY (EGD) WITH PROPOFOL;  Surgeon: Meridee Score Netty Starring., MD;  Location: [facility];  Service: Gastroenterology;  Laterality: N/A;  . EUS  08/23/2019   Procedure: UPPER ENDOSCOPIC ULTRASOUND (EUS) LINEAR;  Surgeon: Lemar Lofty., MD;  Location: [facility];  Service: Gastroenterology;;  . FOREIGN BODY REMOVAL  09/05/2019   Procedure: FOREIGN BODY REMOVAL;  Surgeon: Lemar Lofty., MD;  Location[facility]COPY;  Service: Gastroenterology;;  . FOREIGN BODY REMOVAL  09/18/2019   Procedure: FOREIGN BODY REMOVAL;  Surgeon: Lemar Lofty., MD;  Location: [facility];  Service: Gastroenterology;;  . HOLMIUM LASER APPLICATION Bilateral 05/02/2013   Procedure: HOLMIUM LASER APPLICATION;  Surgeon: Valetta Fuller, MD;  Location: [facility];  Service: Urology;  Laterality: Bilateral;  . LAPAROSCOPIC APPENDECTOMY N/A 05/22/2014   Procedure: APPENDECTOMY LAPAROSCOPIC;  Surgeon: Glenna Fellows, MD;  Location: [facility];  Service: General;  Laterality: N/A;  . PANCREATIC STENT PLACEMENT  08/23/2019   Procedure: PANCREATIC STENT PLACEMENT;  Surgeon: Lemar Lofty., MD;  Location: [facility];  Service: Gastroenterology;;  . PANCREATIC STENT PLACEMENT  09/05/2019   Procedure: PANCREATIC STENT PLACEMENT;  Surgeon: Lemar Lofty., MD;  Location: [facility] ENDOSCOPY;  Service: Gastroenterology;;  .  PANCREATIC STENT PLACEMENT  09/18/2019   Procedure: PANCREATIC STENT PLACEMENT;  Surgeon: Lemar Lofty., MD;  Location: [facility];  Service: Gastroenterology;;  cyst gastrostomy double pigtail stent placement   . STENT REMOVAL  08/26/2019   Procedure: STENT REMOVAL;  Surgeon: Lemar Lofty., MD;  Location: [facility] ENDOSCOPY;  Service: Gastroenterology;;  . Francine Graven REMOVAL  09/05/2019   Procedure: STENT REMOVAL;  Surgeon: Lemar Lofty., MD;  Location: [facility] ENDOSCOPY;  Service: Gastroenterology;;  . Francine Graven REMOVAL  09/18/2019   Procedure: STENT REMOVAL;  Surgeon: Lemar Lofty., MD;  Location: [facility];  Service: Gastroenterology;;  . TONSILLECTOMY  age 50  . UPPER ESOPHAGEAL ENDOSCOPIC ULTRASOUND (EUS) N/A 08/21/2019   Procedure: UPPER ESOPHAGEAL ENDOSCOPIC ULTRASOUND (EUS);  Surgeon: Lemar Lofty., MD;  Location: [facility];  Service: Gastroenterology;  Laterality: N/A;  For cyst gastrostomy     OB History   No obstetric history on file.     Family History  Family history unknown: Yes    Social History   Tobacco Use  . Smoking status: Never Smoker  . Smokeless tobacco: Never Used  Vaping Use  . Vaping Use: Never used  Substance Use Topics  . Alcohol use: No  . Drug use: No    Home Medications Prior to Admission medications   Medication Sig Start Date End Date Taking? Authorizing Provider  albuterol (VENTOLIN HFA) 108 (90 Base) MCG/ACT inhaler Inhale 1-2 puffs into the lungs every 6 (six) hours as needed for wheezing or shortness of breath. 11/04/19   Rancour, Jeannett Senior, MD  amitriptyline (ELAVIL) 75 MG tablet Take 75 mg by mouth at bedtime. 08/10/19   [provider]  amLODipine (NORVASC) 5 MG tablet Take 5 mg by mouth daily.    [provider]  ARIPiprazole (ABILIFY) 2 MG tablet Take 2 mg by mouth daily. 10/22/19   [provider]  atorvastatin (LIPITOR) 40 MG tablet Take 40 mg by mouth daily.    [provider]  clonazePAM (KLONOPIN) 0.5 MG tablet Take 0.5 mg by mouth daily.  05/23/19   [provider]  diclofenac (VOLTAREN) 75 MG EC tablet Take 75 mg by mouth 2 (two) times daily. 10/30/19   [provider]  Ensure Max Protein (ENSURE MAX PROTEIN) LIQD Take 330 mLs (11 oz total) by mouth 2 (two) times daily. 11/17/19   Swayze, Ava, DO  JANUMET 50-1000 MG tablet Take 1 tablet by mouth 2 (two) times daily. 09/24/19   [provider]  lamoTRIgine (LAMICTAL) 200 MG tablet Take 200 mg by mouth daily. 10/30/19   [provider]  LANTUS SOLOSTAR 100 UNIT/ML Solostar Pen Inject 60 Units into the skin 2 (two) times daily. 11/17/19   Swayze, Ava, DO  LATUDA 80 MG TABS tablet Take 80 mg by mouth at bedtime. 11/06/19   [provider]  losartan (COZAAR) 25 MG tablet Take 25 mg by mouth daily.    [provider]  ondansetron (ZOFRAN) 8 MG tablet Take 1 tablet (8 mg total) by mouth every 8 (eight) hours as needed for nausea or vomiting. 11/17/19   Swayze, Ava, DO  Oxycodone HCl 10 MG TABS Take 10 mg by mouth every 4 (four) hours as needed (pain).  08/12/19   [provider]  pantoprazole (PROTONIX) 40 MG tablet TAKE 1 TABLET (40 MG TOTAL) BY MOUTH 2 (TWO) TIMES DAILY FOR 28 DAYS. 11/09/19 12/07/19  Reva Bores, MD  promethazine (PHENERGAN) 6.25 MG/5ML syrup Take 5 mLs by mouth 2 (two) times daily as needed for nausea/vomiting. 11/08/19   [provider]  propranolol (INDERAL) 20 MG tablet Take 20 mg by mouth 2 (two) times daily.    [provider]  risperiDONE (RISPERDAL) 0.5 MG tablet Take 0.5 mg by mouth at bedtime.  09/22/19   [provider]  traZODone (DESYREL) 100 MG tablet Take 100 mg by mouth at bedtime.    [provider]    Allergies    Patient has no known allergies.  Review of Systems   Review of Systems  Constitutional: Negative for chills, diaphoresis, fatigue and fever.  HENT: Negative for  congestion.   Eyes: Negative for visual disturbance.  Respiratory: Negative for chest tightness, shortness of breath and wheezing.   Cardiovascular: Negative for chest pain.  Gastrointestinal: Negative for abdominal pain, constipation, diarrhea, nausea and vomiting.  Genitourinary: Negative for dysuria.  Musculoskeletal: Negative for back pain and neck pain.  Neurological: Positive for weakness and numbness. Negative for dizziness, seizures, syncope, facial asymmetry, speech difficulty, light-headedness and headaches.  Psychiatric/Behavioral: Negative for agitation.  All other systems reviewed and are negative.   Physical Exam Updated Vital Signs BP 114/82 (BP Location: Right Arm)   Pulse 91   Temp 99 F (37.2 C)   Resp 19   LMP 03/23/2014 Comment: (-) u preg?/ac  SpO2 100%   Physical Exam Vitals and nursing note reviewed.  Constitutional:      General: She is not in acute distress.    Appearance: She is well-developed. She is not ill-appearing, toxic-appearing or diaphoretic.  HENT:     Head: Normocephalic and atraumatic.     Right Ear: External ear normal.     Left Ear: External ear normal.     Nose: Nose normal. No congestion or rhinorrhea.     Mouth/Throat:     Mouth: Mucous membranes are moist.     Pharynx: No oropharyngeal exudate or posterior oropharyngeal erythema.  Eyes:     General: No visual field deficit.    Extraocular Movements: Extraocular movements intact.     Conjunctiva/sclera: Conjunctivae normal.     Pupils: Pupils are equal, round, and reactive to light.  Cardiovascular:     Rate and Rhythm: Normal rate.     Pulses: Normal pulses.     Heart sounds: No murmur heard.   Pulmonary:     Effort: Pulmonary effort is normal. No respiratory distress.     Breath sounds: No stridor. No wheezing, rhonchi or rales.  Chest:     Chest wall: No tenderness.  Abdominal:     General: Abdomen is flat. There is no distension.     Tenderness: There is no abdominal  tenderness. There is no right CVA tenderness, left CVA tenderness or rebound.  Musculoskeletal:        General: No tenderness.     Cervical back: Normal range of motion and neck supple. No tenderness.  Skin:    General: Skin is warm.     Findings: No erythema or rash.  Neurological:     Mental Status: She is alert and oriented to person, place, and time.     Cranial Nerves: Cranial nerve deficit present. No dysarthria or facial asymmetry.     Sensory: Sensory deficit present.     Motor: Weakness present. No tremor or abnormal muscle tone.     Coordination: Coordination normal.     Deep Tendon  Reflexes: Reflexes are normal and symmetric.     Comments: Subjective numbness and weakness in right arm and right leg.  Numbness in right face.  No facial droop.  Clear speech.  No other focal neurologic deficits appreciated.  Psychiatric:        Mood and Affect: Mood normal.     ED Results / Procedures / Treatments   Labs (all labs ordered are listed, but only abnormal results are displayed) Labs Reviewed  CBC - Abnormal; Notable for the following components:      Result Value   Hemoglobin 9.5 (*)    HCT 32.7 (*)    MCV 76.2 (*)    MCH 22.1 (*)    MCHC 29.1 (*)    RDW 18.1 (*)    All other components within normal limits  COMPREHENSIVE METABOLIC PANEL - Abnormal; Notable for the following components:   Glucose, Bld 217 (*)    Creatinine, Ser 1.05 (*)    Calcium 8.8 (*)    All other components within normal limits  CBG MONITORING, ED - Abnormal; Notable for the following components:   Glucose-Capillary 198 (*)    All other components within normal limits  SARS CORONAVIRUS 2 BY RT PCR (HOSPITAL ORDER, PERFORMED IN [facility])  PROTIME-INR  APTT  DIFFERENTIAL  ETHANOL  RAPID URINE DRUG SCREEN, HOSP PERFORMED  URINALYSIS, ROUTINE W REFLEX MICROSCOPIC  PREGNANCY, URINE    EKG EKG Interpretation  Date/Time:  Wednesday November 27 2019 19:43:06 EDT Ventricular Rate:    90 PR Interval:    QRS Duration: 99 QT Interval:  412 QTC Calculation: 505 R Axis:   15 Text Interpretation: Sinus rhythm Borderline prolonged QT interval When comapred to prior, similar appearanc with slightly longer QTc. No sTEMI Confirmed by Theda Belfastegeler, Chris ([id number]) on 11/27/2019 7:56:10 PM   Radiology CT HEAD CODE STROKE WO CONTRAST  Result Date: 11/27/2019 CLINICAL DATA:  Code stroke.  Right-sided numbness EXAM: CT HEAD WITHOUT CONTRAST TECHNIQUE: Contiguous axial images were obtained from the base of the skull through the vertex without intravenous contrast. COMPARISON:  None. FINDINGS: Brain: There is no mass, hemorrhage or extra-axial collection. The size and configuration of the ventricles and extra-axial CSF spaces are normal. The brain parenchyma is normal, without evidence of acute or chronic infarction. Vascular: No abnormal hyperdensity of the major intracranial arteries or dural venous sinuses. No intracranial atherosclerosis. Skull: The visualized skull base, calvarium and extracranial soft tissues are normal. Sinuses/Orbits: No fluid levels or advanced mucosal thickening of the visualized paranasal sinuses. No mastoid or middle ear effusion. The orbits are normal. ASPECTS [facility]([place] Stroke Program Early CT Score) - Ganglionic level infarction (caudate, lentiform nuclei, internal capsule, insula, M1-M3 cortex): 7 - Supraganglionic infarction (M4-M6 cortex): 3 Total score (0-10 with 10 being normal): 10 IMPRESSION: 1. Normal head CT. 2. ASPECTS is 10. 3. These results were called by telephone at the time of interpretation on 11/27/2019 at 8:19 pm to provider [facility] Naleigha Raimondi , who verbally acknowledged these results. Electronically Signed   By: Deatra RobinsonKevin  Herman M.D.   On: 11/27/2019 20:19   CT ANGIO HEAD CODE STROKE  Result Date: 11/27/2019 CLINICAL DATA:  Right-sided numbness EXAM: CT ANGIOGRAPHY HEAD AND NECK TECHNIQUE: Multidetector CT imaging of the head and neck was performed using the  standard protocol during bolus administration of intravenous contrast. Multiplanar CT image reconstructions and MIPs were obtained to evaluate the vascular anatomy. Carotid stenosis measurements (when applicable) are obtained utilizing NASCET criteria, using the distal  internal carotid diameter as the denominator. CONTRAST:  OMNIPAQUE IOHEXOL 350 MG/ML SOLN COMPARISON:  09/29/2019 CTA head neck FINDINGS: CTA NECK FINDINGS SKELETON: There is no bony spinal canal stenosis. No lytic or blastic lesion. OTHER NECK: Normal pharynx, larynx and major salivary glands. No cervical lymphadenopathy. Unremarkable thyroid gland. UPPER CHEST: No pneumothorax or pleural effusion. No nodules or masses. AORTIC ARCH: There is no calcific atherosclerosis of the aortic arch. There is no aneurysm, dissection or hemodynamically significant stenosis of the visualized portion of the aorta. Conventional 3 vessel aortic branching pattern. The visualized proximal subclavian arteries are widely patent. RIGHT CAROTID SYSTEM: No dissection, occlusion or aneurysm. Mild atherosclerotic calcification at the carotid bifurcation without hemodynamically significant stenosis. LEFT CAROTID SYSTEM: Normal without aneurysm, dissection or stenosis. VERTEBRAL ARTERIES: Left dominant configuration. Both origins are clearly patent. There is no dissection, occlusion or flow-limiting stenosis to the skull base (V1-V3 segments). CTA HEAD FINDINGS POSTERIOR CIRCULATION: --Vertebral arteries: Normal V4 segments. --Inferior cerebellar arteries: Normal. --Basilar artery: Normal. --Superior cerebellar arteries: Normal. --Posterior cerebral arteries (PCA): Normal. ANTERIOR CIRCULATION: --Intracranial internal carotid arteries: Normal. --Anterior cerebral arteries (ACA): Normal. Both A1 segments are present. Patent anterior communicating artery (a-comm). --Middle cerebral arteries (MCA): Normal. VENOUS SINUSES: As permitted by contrast timing, patent. ANATOMIC  VARIANTS: None Review of the MIP images confirms the above findings. IMPRESSION: 1. No emergent large vessel occlusion or high-grade stenosis of the intracranial or cervical arteries. 2. Mild right carotid bifurcation atherosclerotic calcification without hemodynamically significant stenosis by NASCET criteria. Electronically Signed   By: Deatra Robinson M.D.   On: 11/27/2019 21:18   CT ANGIO NECK CODE STROKE  Result Date: 11/27/2019 CLINICAL DATA:  Right-sided numbness EXAM: CT ANGIOGRAPHY HEAD AND NECK TECHNIQUE: Multidetector CT imaging of the head and neck was performed using the standard protocol during bolus administration of intravenous contrast. Multiplanar CT image reconstructions and MIPs were obtained to evaluate the vascular anatomy. Carotid stenosis measurements (when applicable) are obtained utilizing NASCET criteria, using the distal internal carotid diameter as the denominator. CONTRAST:  OMNIPAQUE IOHEXOL 350 MG/ML SOLN COMPARISON:  09/29/2019 CTA head neck FINDINGS: CTA NECK FINDINGS SKELETON: There is no bony spinal canal stenosis. No lytic or blastic lesion. OTHER NECK: Normal pharynx, larynx and major salivary glands. No cervical lymphadenopathy. Unremarkable thyroid gland. UPPER CHEST: No pneumothorax or pleural effusion. No nodules or masses. AORTIC ARCH: There is no calcific atherosclerosis of the aortic arch. There is no aneurysm, dissection or hemodynamically significant stenosis of the visualized portion of the aorta. Conventional 3 vessel aortic branching pattern. The visualized proximal subclavian arteries are widely patent. RIGHT CAROTID SYSTEM: No dissection, occlusion or aneurysm. Mild atherosclerotic calcification at the carotid bifurcation without hemodynamically significant stenosis. LEFT CAROTID SYSTEM: Normal without aneurysm, dissection or stenosis. VERTEBRAL ARTERIES: Left dominant configuration. Both origins are clearly patent. There is no dissection, occlusion or  flow-limiting stenosis to the skull base (V1-V3 segments). CTA HEAD FINDINGS POSTERIOR CIRCULATION: --Vertebral arteries: Normal V4 segments. --Inferior cerebellar arteries: Normal. --Basilar artery: Normal. --Superior cerebellar arteries: Normal. --Posterior cerebral arteries (PCA): Normal. ANTERIOR CIRCULATION: --Intracranial internal carotid arteries: Normal. --Anterior cerebral arteries (ACA): Normal. Both A1 segments are present. Patent anterior communicating artery (a-comm). --Middle cerebral arteries (MCA): Normal. VENOUS SINUSES: As permitted by contrast timing, patent. ANATOMIC VARIANTS: None Review of the MIP images confirms the above findings. IMPRESSION: 1. No emergent large vessel occlusion or high-grade stenosis of the intracranial or cervical arteries. 2. Mild right carotid bifurcation atherosclerotic calcification without hemodynamically significant stenosis  by NASCET criteria. Electronically Signed   By: Deatra Robinson M.D.   On: 11/27/2019 21:18    Procedures Procedures (including critical care time)  Medications Ordered in ED Medications  alteplase (ACTIVASE) 1 mg/mL injection (has no administration in time range)  iohexol (OMNIPAQUE) 350 MG/ML injection 100 mL (100 mLs Intravenous Contrast Given 11/27/19 2050)    ED Course  I have reviewed the triage vital signs and the nursing notes.  Pertinent labs & imaging results that were available during my care of the patient were reviewed by me and considered in my medical decision making (see chart for details).    MDM Rules/Calculators/A&P                          Shanele Nissan is a 54 y.o. female with a past medical history significant for diabetes, hypertension, hyperlipidemia, schizophrenia, prior pancreatitis, and recent admission 2 months ago for strokelike episode status post TPA who presents with right-sided numbness and weakness.  Patient reports that she was watching TV at 4 PM today when she had sudden onset of right-sided  numbness and weakness.  She reports numbness in her right hemibody as well as weakness in her right arm and right leg.  She is right-handed.  She reports this is similar to the episode 2 months ago which she received TPA for.  She denies any preceding symptoms at all and was having no symptoms before the episode.  On exam, patient has weakness in her right arm and right leg compared to left.  She also reports numbness in the right face, right arm and right leg.  Pupils are symmetric and reactive with normal extraocular movements.  Normal visual fields.  Normal pupil exam.  Lungs clear and chest nontender.  Abdomen nontender.  Patient resting comfortably.  As the patient is within the code stroke window, code stroke was activated for telemedicine.  Anticipate following up on the recommendations.  Orders were placed for code stroke imaging and labs.  8:33 PM Code stroke was called.  Teleneurology saw the patient and was able to find a different last normal of 3 PM.  Thus, they feel she is out of the TPA window at this time.  They would have considered TPA if our timeline was correct.  They do want CTA to look for LVO.  If it is negative, they recommend admission to [facility] for MRI and further stroke work-up.  10:31 PM CTA showed no large vessel occlusion.  Will call for admission to [facility] for admission, MRI, and further management of possible stroke.  Spoke with neurology at [facility] and they agree with admission for MRI and further work-up.  Hospitalist team will admit to [facility] for stroke work-up.  Covid test negative.  Final Clinical Impression(s) / ED Diagnoses Final diagnoses:  Right sided weakness  Right sided numbness    Clinical Impression: 1. Right sided weakness   2. Right sided numbness     Disposition: Admit  This note was prepared with assistance of Dragon voice recognition software. Occasional wrong-word or sound-a-like substitutions may have occurred due to the  inherent limitations of voice recognition software.     Yamilka Lopiccolo, Canary Brim, MD 11/27/19 [phone]

## 2019-11-27 NOTE — ED Notes (Signed)
LAST NORMAL 1600HRS

## 2019-11-27 NOTE — ED Notes (Signed)
CODE STROKE ENACTED/ NEURO TELE IN PROGRESS

## 2019-11-28 ENCOUNTER — Encounter (HOSPITAL_COMMUNITY): Payer: Self-pay | Admitting: Internal Medicine

## 2019-11-28 ENCOUNTER — Observation Stay (HOSPITAL_COMMUNITY): Payer: Medicare Other

## 2019-11-28 DIAGNOSIS — Z794 Long term (current) use of insulin: Secondary | ICD-10-CM | POA: Diagnosis not present

## 2019-11-28 DIAGNOSIS — E1169 Type 2 diabetes mellitus with other specified complication: Secondary | ICD-10-CM | POA: Diagnosis not present

## 2019-11-28 DIAGNOSIS — I10 Essential (primary) hypertension: Secondary | ICD-10-CM

## 2019-11-28 DIAGNOSIS — R531 Weakness: Secondary | ICD-10-CM

## 2019-11-28 LAB — LIPID PANEL
Cholesterol: 144 mg/dL (ref 0–200)
HDL: 64 mg/dL (ref >40)
LDL Cholesterol: 58 mg/dL (ref 0–99)
Total CHOL/HDL Ratio: 2.3 RATIO
Triglycerides: 112 mg/dL (ref <150)
VLDL: 22 mg/dL (ref 0–40)

## 2019-11-28 LAB — URINALYSIS, ROUTINE W REFLEX MICROSCOPIC
Bilirubin Urine: NEGATIVE
Glucose, UA: NEGATIVE mg/dL
Hgb urine dipstick: NEGATIVE
Ketones, ur: NEGATIVE mg/dL
Leukocytes,Ua: NEGATIVE
Nitrite: NEGATIVE
Protein, ur: NEGATIVE mg/dL
Specific Gravity, Urine: 1.01 (ref 1.005–1.030)
pH: 6 (ref 5.0–8.0)

## 2019-11-28 LAB — GLUCOSE, CAPILLARY
Glucose-Capillary: 82 mg/dL (ref 70–99)
Glucose-Capillary: 94 mg/dL (ref 70–99)
Glucose-Capillary: 152 mg/dL — ABNORMAL HIGH (ref 70–99)
Glucose-Capillary: 185 mg/dL — ABNORMAL HIGH (ref 70–99)

## 2019-11-28 LAB — RAPID URINE DRUG SCREEN, HOSP PERFORMED
Amphetamines: NOT DETECTED
Barbiturates: NOT DETECTED
Benzodiazepines: NOT DETECTED
Cocaine: NOT DETECTED
Opiates: NOT DETECTED
Tetrahydrocannabinol: NOT DETECTED

## 2019-11-28 LAB — HEMOGLOBIN A1C
Hgb A1c MFr Bld: 11.9 % — ABNORMAL HIGH (ref 4.8–5.6)
Mean Plasma Glucose: 294.83 mg/dL

## 2019-11-28 LAB — CBC
HCT: 33.8 % — ABNORMAL LOW (ref 36.0–46.0)
Hemoglobin: 9.8 g/dL — ABNORMAL LOW (ref 12.0–15.0)
MCH: 21.8 pg — ABNORMAL LOW (ref 26.0–34.0)
MCHC: 29 g/dL — ABNORMAL LOW (ref 30.0–36.0)
MCV: 75.3 fL — ABNORMAL LOW (ref 80.0–100.0)
Platelets: 211 10*3/uL (ref 150–400)
RBC: 4.49 MIL/uL (ref 3.87–5.11)
RDW: 17.8 % — ABNORMAL HIGH (ref 11.5–15.5)
WBC: 5.2 10*3/uL (ref 4.0–10.5)
nRBC: 0 % (ref 0.0–0.2)

## 2019-11-28 LAB — CREATININE, SERUM
Creatinine, Ser: 0.87 mg/dL (ref 0.44–1.00)
GFR calc Af Amer: >60 mL/min (ref >60)
GFR calc non Af Amer: >60 mL/min (ref >60)

## 2019-11-28 LAB — ETHANOL: Alcohol, Ethyl (B): <10 mg/dL (ref <10)

## 2019-11-28 LAB — PREGNANCY, URINE: Preg Test, Ur: NEGATIVE

## 2019-11-28 IMAGING — MR MR HEAD W/O CM
10 of 11 series · 43 of 48 positions shown · non-contrast
Comparison: Head CT and CTA from yesterday

CLINICAL DATA: Right-sided numbness

EXAM:
MRI HEAD WITHOUT CONTRAST
TECHNIQUE: Multiplanar, multiecho pulse sequences of the brain and surrounding
structures were obtained without intravenous contrast.

[Series 5: DWI · axial · 3.0mm · 0.88mm/px · z∈[-77,+70]mm · 10 of 100 slices shown (1 of 4)]
[im 1/100]
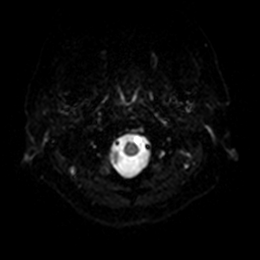
[im 12/100]
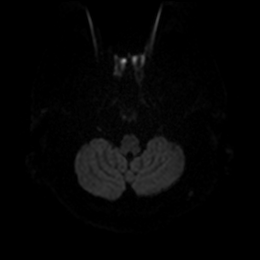
[im 23/100]
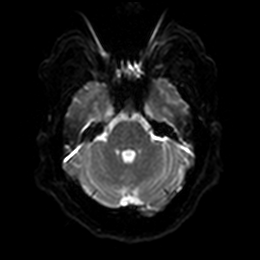
[im 34/100]
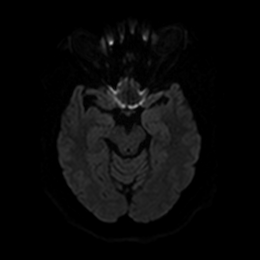
[im 45/100]
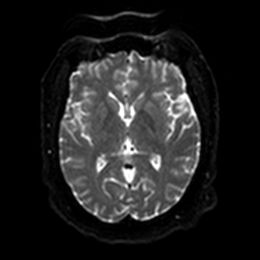
[im 56/100]
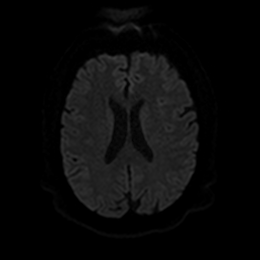
[im 67/100]
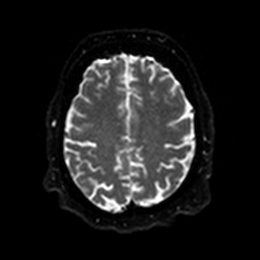
[im 78/100]
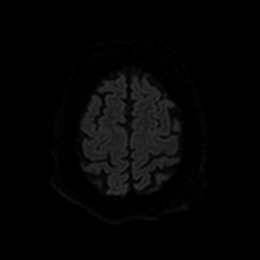
[im 89/100]
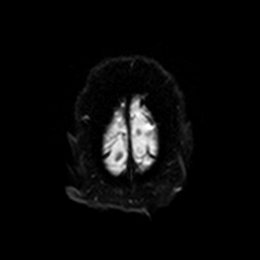
[im 100/100]
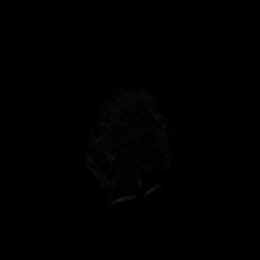

[Series 6: DWI · axial · 3.0mm · 0.88mm/px · z∈[-77,+70]mm · 5 of 50 slices shown (2 of 4)]
[im 1/50]
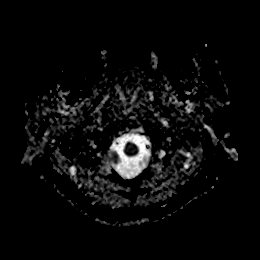
[im 13/50]
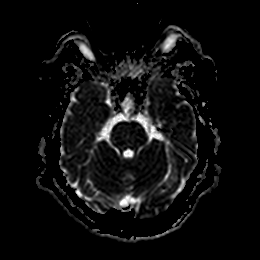
[im 25/50]
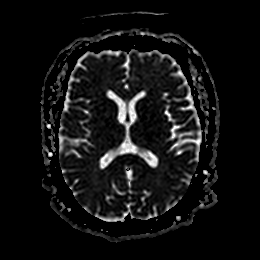
[im 37/50]
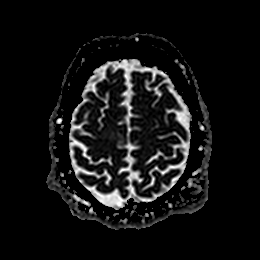
[im 50/50]
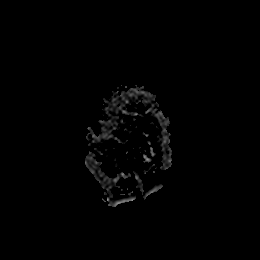

[Series 7: DWI · coronal · 4.0mm · 0.88mm/px · 6 of 66 slices shown (3 of 4)]
[im 1/66]
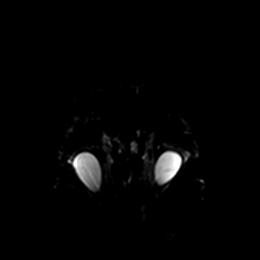
[im 14/66]
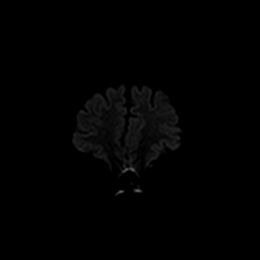
[im 27/66]
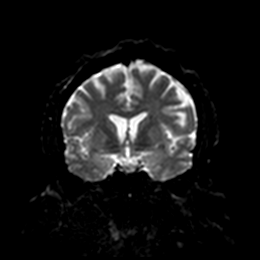
[im 40/66]
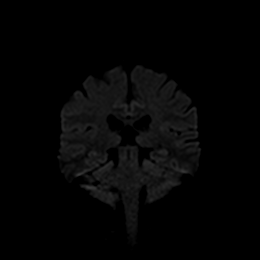
[im 53/66]
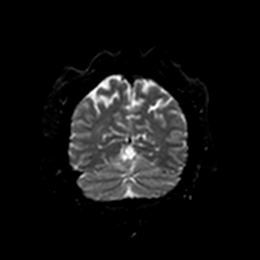
[im 66/66]
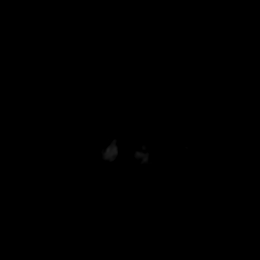

[Series 8: DWI · coronal · 4.0mm · 0.88mm/px · 3 of 33 slices shown (4 of 4)]
[im 1/33]
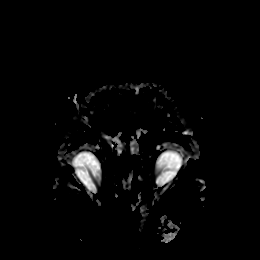
[im 17/33]
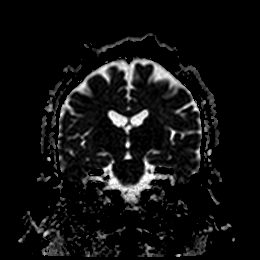
[im 33/33]
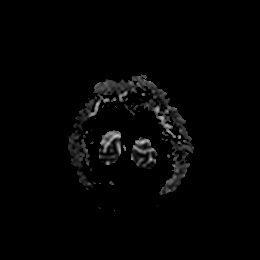

[Series 9: T1 · sagittal · 5.0mm · 0.75mm/px · 2 of 23 slices shown]
[im 1/23]
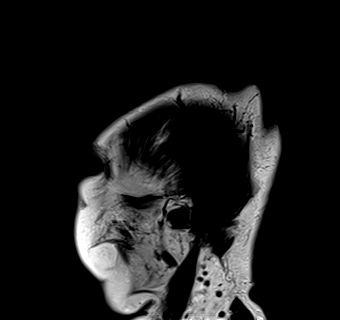
[im 23/23]
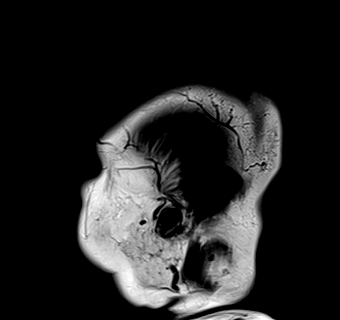

[Series 10: T2 · axial · 5.0mm · 0.72mm/px · z∈[-75,+68]mm · 2 of 25 slices shown (1 of 2)]
[im 1/25]
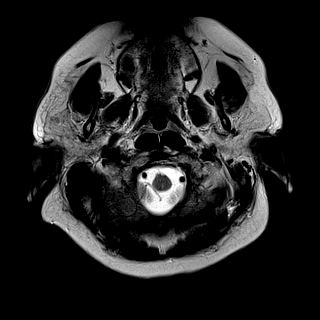
[im 25/25]
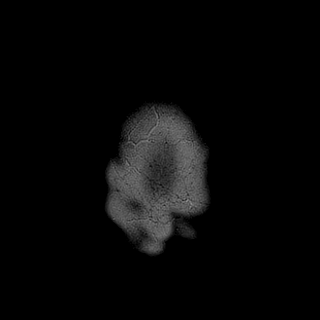

[Series 11: FLAIR · axial · 5.0mm · 0.45mm/px · z∈[-75,+69]mm · 2 of 25 slices shown]
[im 1/25]
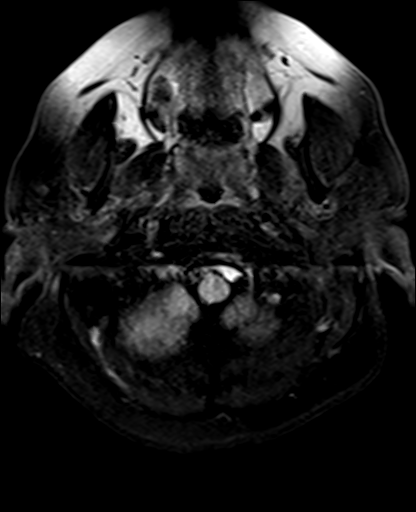
[im 25/25]
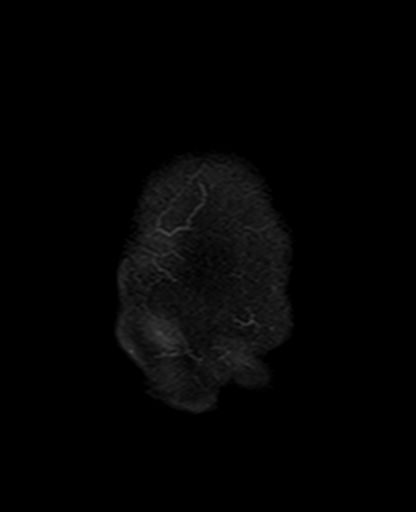

[Series 13: pha_images · axial · 3.0mm · 0.90mm/px · z∈[-92,+85]mm · 5 of 60 slices shown]
[im 1/60]
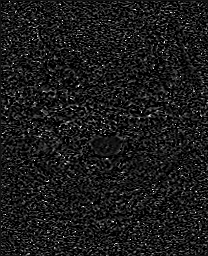
[im 15/60]
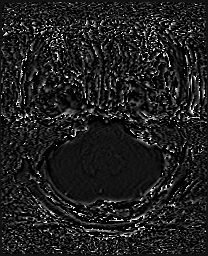
[im 30/60]
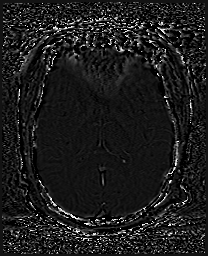
[im 45/60]
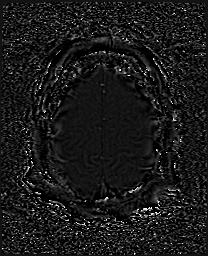
[im 60/60]
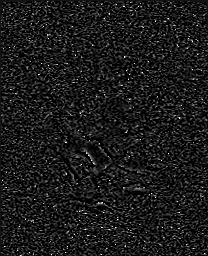

[Series 14: swi_images · axial · 3.0mm · 0.90mm/px · z∈[-92,+85]mm · 5 of 60 slices shown]
[im 1/60]
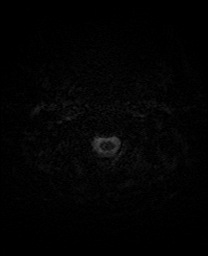
[im 15/60]
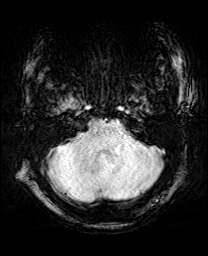
[im 30/60]
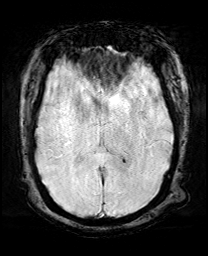
[im 45/60]
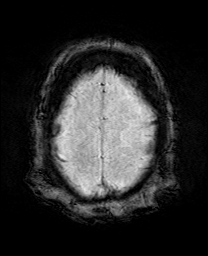
[im 60/60]
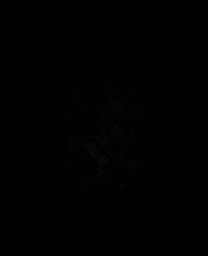

[Series 17: T2 · coronal · 5.0mm · 0.34mm/px · 3 of 29 slices shown (2 of 2)]
[im 1/29]
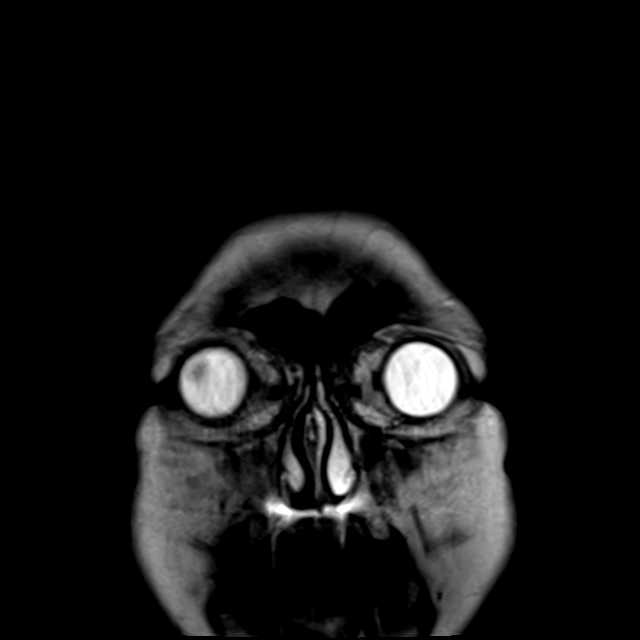
[im 15/29]
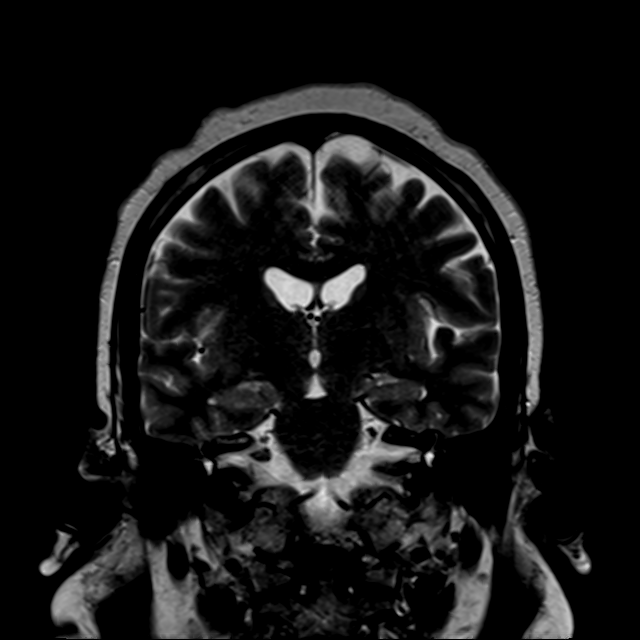
[im 29/29]
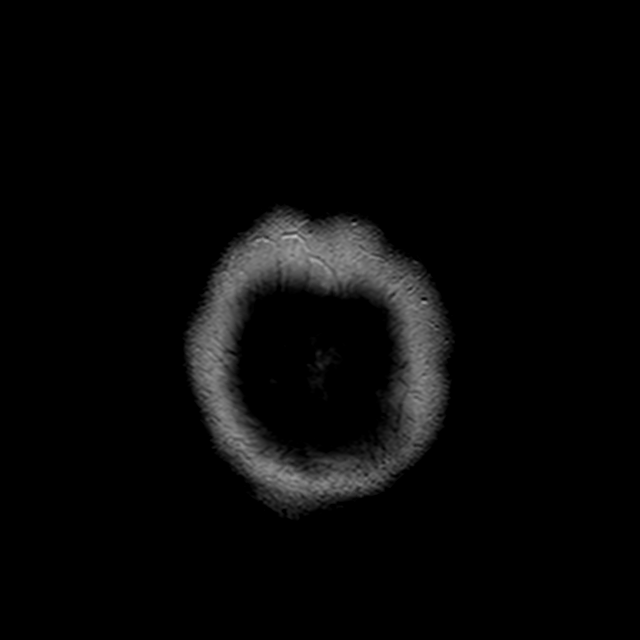

[43 of 48 positions shown; findings below may reference images not displayed]

FINDINGS: Brain: No acute infarction, hemorrhage, hydrocephalus, extra-axial
collection or mass lesion. No atrophy or white matter disease.

Vascular: Normal flow voids

Skull and upper cervical spine: Normal marrow signal

Sinuses/Orbits: Negative

Other: Mild motion degradation.
IMPRESSION: Negative brain MRI.

## 2019-11-28 MED ORDER — CLONAZEPAM 0.5 MG PO TABS
0.5000 mg | ORAL_TABLET | Freq: Every day | ORAL | Status: DC
  Administered 2019-11-28 – 2019-11-29 (×2): 0.5 mg via ORAL
  Filled 2019-11-28 (×2): qty 1

## 2019-11-28 MED ORDER — ACETAMINOPHEN 160 MG/5ML PO SOLN: 650.0000 mg | ORAL | Status: DC | PRN

## 2019-11-28 MED ORDER — AMITRIPTYLINE HCL 25 MG PO TABS
75.0000 mg | ORAL_TABLET | Freq: Every day | ORAL | Status: DC
  Administered 2019-11-28: 75 mg via ORAL
  Filled 2019-11-28: qty 3

## 2019-11-28 MED ORDER — POLYETHYLENE GLYCOL 3350 17 G PO PACK: 17.0000 g | PACK | Freq: Every day | ORAL | Status: DC | PRN

## 2019-11-28 MED ORDER — SENNOSIDES-DOCUSATE SODIUM 8.6-50 MG PO TABS: 2.0000 | ORAL_TABLET | Freq: Every evening | ORAL | Status: DC | PRN

## 2019-11-28 MED ORDER — ENOXAPARIN SODIUM 40 MG/0.4ML ~~LOC~~ SOLN
40.0000 mg | SUBCUTANEOUS | Status: DC
  Administered 2019-11-28 – 2019-11-29 (×2): 40 mg via SUBCUTANEOUS
  Filled 2019-11-28 (×2): qty 0.4

## 2019-11-28 MED ORDER — TRAZODONE HCL 100 MG PO TABS
100.0000 mg | ORAL_TABLET | Freq: Every day | ORAL | Status: DC
  Administered 2019-11-28: 100 mg via ORAL
  Filled 2019-11-28: qty 1

## 2019-11-28 MED ORDER — LOSARTAN POTASSIUM 25 MG PO TABS: 25.0000 mg | ORAL_TABLET | Freq: Every day | ORAL | Status: DC

## 2019-11-28 MED ORDER — RISPERIDONE 0.5 MG PO TABS
0.5000 mg | ORAL_TABLET | Freq: Every day | ORAL | Status: DC
  Administered 2019-11-28: 0.5 mg via ORAL
  Filled 2019-11-28: qty 1

## 2019-11-28 MED ORDER — ACETAMINOPHEN 325 MG PO TABS: 650.0000 mg | ORAL_TABLET | ORAL | Status: DC | PRN

## 2019-11-28 MED ORDER — INSULIN GLARGINE 100 UNIT/ML ~~LOC~~ SOLN
60.0000 [IU] | Freq: Two times a day (BID) | SUBCUTANEOUS | Status: DC
  Administered 2019-11-28 – 2019-11-29 (×3): 60 [IU] via SUBCUTANEOUS
  Filled 2019-11-28 (×4): qty 0.6

## 2019-11-28 MED ORDER — ASPIRIN 300 MG RE SUPP: 300.0000 mg | Freq: Every day | RECTAL | Status: DC

## 2019-11-28 MED ORDER — AMLODIPINE BESYLATE 5 MG PO TABS
5.0000 mg | ORAL_TABLET | Freq: Every day | ORAL | Status: DC
  Administered 2019-11-28 – 2019-11-29 (×2): 5 mg via ORAL
  Filled 2019-11-28 (×2): qty 1

## 2019-11-28 MED ORDER — ASPIRIN 325 MG PO TABS
325.0000 mg | ORAL_TABLET | Freq: Every day | ORAL | Status: DC
  Administered 2019-11-29: 325 mg via ORAL
  Filled 2019-11-28 (×2): qty 1

## 2019-11-28 MED ORDER — INSULIN ASPART 100 UNIT/ML ~~LOC~~ SOLN
0.0000 [IU] | Freq: Three times a day (TID) | SUBCUTANEOUS | Status: DC
  Administered 2019-11-28: 2 [IU] via SUBCUTANEOUS

## 2019-11-28 MED ORDER — PROPRANOLOL HCL 20 MG PO TABS
20.0000 mg | ORAL_TABLET | Freq: Two times a day (BID) | ORAL | Status: DC
  Administered 2019-11-28 – 2019-11-29 (×3): 20 mg via ORAL
  Filled 2019-11-28 (×4): qty 1

## 2019-11-28 MED ORDER — ACETAMINOPHEN 650 MG RE SUPP: 650.0000 mg | RECTAL | Status: DC | PRN

## 2019-11-28 MED ORDER — STROKE: EARLY STAGES OF RECOVERY BOOK
Freq: Once | Status: AC
  Filled 2019-11-28: qty 1

## 2019-11-28 MED ORDER — LAMOTRIGINE 100 MG PO TABS
200.0000 mg | ORAL_TABLET | Freq: Every day | ORAL | Status: DC
  Administered 2019-11-28 – 2019-11-29 (×2): 200 mg via ORAL
  Filled 2019-11-28 (×2): qty 2

## 2019-11-28 MED ORDER — LURASIDONE HCL 40 MG PO TABS
80.0000 mg | ORAL_TABLET | Freq: Every day | ORAL | Status: DC
  Administered 2019-11-28: 80 mg via ORAL
  Filled 2019-11-28 (×2): qty 2

## 2019-11-28 MED ORDER — SODIUM CHLORIDE 0.9 % IV SOLN: INTRAVENOUS | Status: AC

## 2019-11-28 MED ORDER — ATORVASTATIN CALCIUM 40 MG PO TABS
40.0000 mg | ORAL_TABLET | Freq: Every day | ORAL | Status: DC
  Administered 2019-11-28: 40 mg via ORAL
  Filled 2019-11-28: qty 1

## 2019-11-28 MED ORDER — ARIPIPRAZOLE 2 MG PO TABS
2.0000 mg | ORAL_TABLET | Freq: Every day | ORAL | Status: DC
  Administered 2019-11-28 – 2019-11-29 (×2): 2 mg via ORAL
  Filled 2019-11-28 (×2): qty 1

## 2019-11-28 NOTE — Progress Notes (Signed)
PROGRESS NOTE    Monique Holland  XBW:[id number] DOB: [birth year] DOA: 11/27/2019 PCP: Knox Royalty, MD   Brief Narrative:  54 y.o. female with history of diabetes mellitus type 2, hypertension, hyperlipidemia, bipolar disorder was admitted 2 weeks ago for DKA when patient had missed her doses of insulin presents to the ER at [facility] with complaint of right-sided weakness.  CT head followed by CT angiogram of the head and neck was done which did not show any large vessel obstruction.  Tele neurology was consulted and felt that patient was outside the window for TPA and patient's neurological exam also was inconsistent.  MRI brain negative.  Neurology consulted.   Assessment & Plan:   Principal Problem:   Right sided weakness Active Problems:   Type 2 diabetes mellitus (HCC)   Essential hypertension   OSA (obstructive sleep apnea)   Right-sided weakness, unknown exact etiology -CT of the head is unremarkable, MRI brain is also negative -We will consult neurology for their input.  Unsure if patient would benefit from EEG. -Patient mental decline psychiatry consultation with no further neuro work-up necessary -Check A1c and lipid panel  Diabetes mellitus type 2, insulin-dependent -Insulin sliding scale and Accu-Chek -Lantus 60 units twice daily  Essential hypertension -Norvasc 5 mg daily, propranolol 20 mg twice daily  Hyperlipidemia -Lipitor 40 mg daily  Bipolar disorder -Continue Elavil, Abilify of, Klonopin, Risperdal and trazodone -May need psychiatry consult  Anemia of chronic disease, microcytic -Continue to monitor hemoglobin.  Follow-up outpatient.   DVT prophylaxis: Lovenox Code Status: Full code Family Communication: None  Status is: Observation  The patient remains OBS appropriate and will d/c before 2 midnights.  Dispo: The patient is from: Home              Anticipated d/c is to: To be determined              Anticipated d/c date is: 1  day              Patient currently is not medically stable to d/c.  Patient is still having significant weakness on the right upper and lower extremity, neurology consulted.  Maintain hospital stay today.  Subjective: Still reporting of right upper and lower extremity weakness.  No other complaints  Review of Systems Otherwise negative except as per HPI, including: General: Denies fever, chills, night sweats or unintended weight loss. Resp: Denies cough, wheezing, shortness of breath. Cardiac: Denies chest pain, palpitations, orthopnea, paroxysmal nocturnal dyspnea. GI: Denies abdominal pain, nausea, vomiting, diarrhea or constipation GU: Denies dysuria, frequency, hesitancy or incontinence MS: Denies muscle aches, joint pain or swelling Neuro: Denies headache, neurologic deficits (focal weakness, numbness, tingling), abnormal gait Psych: Denies anxiety, depression, SI/HI/AVH Skin: Denies new rashes or lesions ID: Denies sick contacts, exotic exposures, travel  Examination:  General exam: Appears calm and comfortable  Respiratory system: Clear to auscultation. Respiratory effort normal. Cardiovascular system: S1 & S2 heard, RRR. No JVD, murmurs, rubs, gallops or clicks. No pedal edema. Gastrointestinal system: Abdomen is nondistended, soft and nontender. No organomegaly or masses felt. Normal bowel sounds heard. Central nervous system: Alert and oriented.  All cranial nerves are intact.  There is a right upper and lower extremity weakness Extremities: Right-sided weakness upper and lower extremity 3/5, left upper and lower extremity strength 5/5 Skin: No rashes, lesions or ulcers Psychiatry: Judgement and insight appear normal. Mood & affect appropriate.     Objective: Vitals:   11/28/19 0329 11/28/19 0424  11/28/19 0620 11/28/19 0820  BP: 111/72 136/83 116/74 (P) 115/67  Pulse: 90 89 90 (P) 95  Resp: 13 20 18  (P) 18  Temp:  98 F (36.7 C) (!) 97.5 F (36.4 C) (P) 98.6 F (37  C)  TempSrc:  Oral Oral (P) Oral  SpO2: 99% 99% 100% (P) 99%   No intake or output data in the 24 hours ending 11/28/19 1108 There were no vitals filed for this visit.   Data Reviewed:   CBC: Recent Labs  Lab 11/27/19 2007 11/28/19 0813  WBC 6.4 5.2  NEUTROABS 3.2  --   HGB 9.5* 9.8*  HCT 32.7* 33.8*  MCV 76.2* 75.3*  PLT 206 211   Basic Metabolic Panel: Recent Labs  Lab 11/27/19 2007 11/28/19 0813  NA 136  --   K 4.1  --   CL 98  --   CO2 26  --   GLUCOSE 217*  --   BUN 19  --   CREATININE 1.05* 0.87  CALCIUM 8.8*  --    GFR: Estimated Creatinine Clearance: 111.9 mL/min (by C-G formula based on SCr of 0.87 mg/dL). Liver Function Tests: Recent Labs  Lab 11/27/19 2007  AST 22  ALT 16  ALKPHOS 50  BILITOT 0.3  PROT 6.9  ALBUMIN 3.6   No results for input(s): LIPASE, AMYLASE in the last 168 hours. No results for input(s): AMMONIA in the last 168 hours. Coagulation Profile: Recent Labs  Lab 11/27/19 2007  INR 0.9   Cardiac Enzymes: No results for input(s): CKTOTAL, CKMB, CKMBINDEX, TROPONINI in the last 168 hours. BNP (last 3 results) No results for input(s): PROBNP in the last 8760 hours. HbA1C: No results for input(s): HGBA1C in the last 72 hours. CBG: Recent Labs  Lab 11/27/19 1936 11/28/19 0548  GLUCAP 198* 82   Lipid Profile: No results for input(s): CHOL, HDL, LDLCALC, TRIG, CHOLHDL, LDLDIRECT in the last 72 hours. Thyroid Function Tests: No results for input(s): TSH, T4TOTAL, FREET4, T3FREE, THYROIDAB in the last 72 hours. Anemia Panel: No results for input(s): VITAMINB12, FOLATE, FERRITIN, TIBC, IRON, RETICCTPCT in the last 72 hours. Sepsis Labs: No results for input(s): PROCALCITON, LATICACIDVEN in the last 168 hours.  Recent Results (from the past 240 hour(s))  SARS Coronavirus 2 by RT PCR (hospital order, performed in [facility] hospital lab) Nasopharyngeal Nasopharyngeal Swab     Status: None   Collection Time: 11/27/19  8:08  PM   Specimen: Nasopharyngeal Swab  Result Value Ref Range Status   SARS Coronavirus 2 NEGATIVE NEGATIVE Final    Comment: (NOTE) SARS-CoV-2 target nucleic acids are NOT DETECTED.  The SARS-CoV-2 RNA is generally detectable in upper and lower respiratory specimens during the acute phase of infection. The lowest concentration of SARS-CoV-2 viral copies this assay can detect is 250 copies / mL. A negative result does not preclude SARS-CoV-2 infection and should not be used as the sole basis for treatment or other patient management decisions.  A negative result may occur with improper specimen collection / handling, submission of specimen other than nasopharyngeal swab, presence of viral mutation(s) within the areas targeted by this assay, and inadequate number of viral copies (<250 copies / mL). A negative result must be combined with clinical observations, patient history, and epidemiological information.  Fact Sheet for Patients:   01/28/20  Fact Sheet for Healthcare Providers: BoilerBrush.com.cy  This test is not yet approved or  cleared by the https://pope.com/ FDA and has been authorized for detection and/or  diagnosis of SARS-CoV-2 by FDA under an Emergency Use Authorization (EUA).  This EUA will remain in effect (meaning this test can be used) for the duration of the COVID-19 declaration under Section 564(b)(1) of the Act, 21 U.S.C. section 360bbb-3(b)(1), unless the authorization is terminated or revoked sooner.  Performed at [facility], [address] [place], [place] [address]          Radiology Studies: MR BRAIN WO CONTRAST  Result Date: 11/28/2019 CLINICAL DATA:  Right-sided numbness EXAM: MRI HEAD WITHOUT CONTRAST TECHNIQUE: Multiplanar, multiecho pulse sequences of the brain and surrounding structures were obtained without intravenous contrast. COMPARISON:  Head CT and CTA from yesterday  FINDINGS: Brain: No acute infarction, hemorrhage, hydrocephalus, extra-axial collection or mass lesion. No atrophy or white matter disease. Vascular: Normal flow voids Skull and upper cervical spine: Normal marrow signal Sinuses/Orbits: Negative Other: Mild motion degradation. IMPRESSION: Negative brain MRI. Electronically Signed   By: Marnee Spring M.D.   On: 11/28/2019 08:09   CT HEAD CODE STROKE WO CONTRAST  Result Date: 11/27/2019 CLINICAL DATA:  Code stroke.  Right-sided numbness EXAM: CT HEAD WITHOUT CONTRAST TECHNIQUE: Contiguous axial images were obtained from the base of the skull through the vertex without intravenous contrast. COMPARISON:  None. FINDINGS: Brain: There is no mass, hemorrhage or extra-axial collection. The size and configuration of the ventricles and extra-axial CSF spaces are normal. The brain parenchyma is normal, without evidence of acute or chronic infarction. Vascular: No abnormal hyperdensity of the major intracranial arteries or dural venous sinuses. No intracranial atherosclerosis. Skull: The visualized skull base, calvarium and extracranial soft tissues are normal. Sinuses/Orbits: No fluid levels or advanced mucosal thickening of the visualized paranasal sinuses. No mastoid or middle ear effusion. The orbits are normal. ASPECTS [facility] Stroke Program Early CT Score) - Ganglionic level infarction (caudate, lentiform nuclei, internal capsule, insula, M1-M3 cortex): 7 - Supraganglionic infarction (M4-M6 cortex): 3 Total score (0-10 with 10 being normal): 10 IMPRESSION: 1. Normal head CT. 2. ASPECTS is 10. 3. These results were called by telephone at the time of interpretation on 11/27/2019 at 8:19 pm to provider [facility] , who verbally acknowledged these results. Electronically Signed   By: Deatra Robinson M.D.   On: 11/27/2019 20:19   CT ANGIO HEAD CODE STROKE  Result Date: 11/27/2019 CLINICAL DATA:  Right-sided numbness EXAM: CT ANGIOGRAPHY HEAD AND NECK TECHNIQUE:  Multidetector CT imaging of the head and neck was performed using the standard protocol during bolus administration of intravenous contrast. Multiplanar CT image reconstructions and MIPs were obtained to evaluate the vascular anatomy. Carotid stenosis measurements (when applicable) are obtained utilizing NASCET criteria, using the distal internal carotid diameter as the denominator. CONTRAST:  OMNIPAQUE IOHEXOL 350 MG/ML SOLN COMPARISON:  09/29/2019 CTA head neck FINDINGS: CTA NECK FINDINGS SKELETON: There is no bony spinal canal stenosis. No lytic or blastic lesion. OTHER NECK: Normal pharynx, larynx and major salivary glands. No cervical lymphadenopathy. Unremarkable thyroid gland. UPPER CHEST: No pneumothorax or pleural effusion. No nodules or masses. AORTIC ARCH: There is no calcific atherosclerosis of the aortic arch. There is no aneurysm, dissection or hemodynamically significant stenosis of the visualized portion of the aorta. Conventional 3 vessel aortic branching pattern. The visualized proximal subclavian arteries are widely patent. RIGHT CAROTID SYSTEM: No dissection, occlusion or aneurysm. Mild atherosclerotic calcification at the carotid bifurcation without hemodynamically significant stenosis. LEFT CAROTID SYSTEM: Normal without aneurysm, dissection or stenosis. VERTEBRAL ARTERIES: Left dominant configuration. Both origins are clearly patent.  There is no dissection, occlusion or flow-limiting stenosis to the skull base (V1-V3 segments). CTA HEAD FINDINGS POSTERIOR CIRCULATION: --Vertebral arteries: Normal V4 segments. --Inferior cerebellar arteries: Normal. --Basilar artery: Normal. --Superior cerebellar arteries: Normal. --Posterior cerebral arteries (PCA): Normal. ANTERIOR CIRCULATION: --Intracranial internal carotid arteries: Normal. --Anterior cerebral arteries (ACA): Normal. Both A1 segments are present. Patent anterior communicating artery (a-comm). --Middle cerebral arteries (MCA): Normal.  VENOUS SINUSES: As permitted by contrast timing, patent. ANATOMIC VARIANTS: None Review of the MIP images confirms the above findings. IMPRESSION: 1. No emergent large vessel occlusion or high-grade stenosis of the intracranial or cervical arteries. 2. Mild right carotid bifurcation atherosclerotic calcification without hemodynamically significant stenosis by NASCET criteria. Electronically Signed   By: Deatra RobinsonKevin  Herman M.D.   On: 11/27/2019 21:18   CT ANGIO NECK CODE STROKE  Result Date: 11/27/2019 CLINICAL DATA:  Right-sided numbness EXAM: CT ANGIOGRAPHY HEAD AND NECK TECHNIQUE: Multidetector CT imaging of the head and neck was performed using the standard protocol during bolus administration of intravenous contrast. Multiplanar CT image reconstructions and MIPs were obtained to evaluate the vascular anatomy. Carotid stenosis measurements (when applicable) are obtained utilizing NASCET criteria, using the distal internal carotid diameter as the denominator. CONTRAST:  100mL OMNIPAQUE IOHEXOL 350 MG/ML SOLN COMPARISON:  09/29/2019 CTA head neck FINDINGS: CTA NECK FINDINGS SKELETON: There is no bony spinal canal stenosis. No lytic or blastic lesion. OTHER NECK: Normal pharynx, larynx and major salivary glands. No cervical lymphadenopathy. Unremarkable thyroid gland. UPPER CHEST: No pneumothorax or pleural effusion. No nodules or masses. AORTIC ARCH: There is no calcific atherosclerosis of the aortic arch. There is no aneurysm, dissection or hemodynamically significant stenosis of the visualized portion of the aorta. Conventional 3 vessel aortic branching pattern. The visualized proximal subclavian arteries are widely patent. RIGHT CAROTID SYSTEM: No dissection, occlusion or aneurysm. Mild atherosclerotic calcification at the carotid bifurcation without hemodynamically significant stenosis. LEFT CAROTID SYSTEM: Normal without aneurysm, dissection or stenosis. VERTEBRAL ARTERIES: Left dominant configuration. Both  origins are clearly patent. There is no dissection, occlusion or flow-limiting stenosis to the skull base (V1-V3 segments). CTA HEAD FINDINGS POSTERIOR CIRCULATION: --Vertebral arteries: Normal V4 segments. --Inferior cerebellar arteries: Normal. --Basilar artery: Normal. --Superior cerebellar arteries: Normal. --Posterior cerebral arteries (PCA): Normal. ANTERIOR CIRCULATION: --Intracranial internal carotid arteries: Normal. --Anterior cerebral arteries (ACA): Normal. Both A1 segments are present. Patent anterior communicating artery (a-comm). --Middle cerebral arteries (MCA): Normal. VENOUS SINUSES: As permitted by contrast timing, patent. ANATOMIC VARIANTS: None Review of the MIP images confirms the above findings. IMPRESSION: 1. No emergent large vessel occlusion or high-grade stenosis of the intracranial or cervical arteries. 2. Mild right carotid bifurcation atherosclerotic calcification without hemodynamically significant stenosis by NASCET criteria. Electronically Signed   By: Deatra RobinsonKevin  Herman M.D.   On: 11/27/2019 21:18        Scheduled Meds: . amitriptyline  75 mg Oral QHS  . amLODipine  5 mg Oral Daily  . ARIPiprazole  2 mg Oral Daily  . aspirin  300 mg Rectal Daily   Or  . aspirin  325 mg Oral Daily  . atorvastatin  40 mg Oral q1800  . clonazePAM  0.5 mg Oral Daily  . enoxaparin (LOVENOX) injection  40 mg Subcutaneous Q24H  . insulin aspart  0-9 Units Subcutaneous TID WC  . insulin glargine  60 Units Subcutaneous BID  . lamoTRIgine  200 mg Oral Daily  . lurasidone  80 mg Oral QHS  . propranolol  20 mg Oral BID  . risperiDONE  0.5  mg Oral QHS  . traZODone  100 mg Oral QHS   Continuous Infusions: . sodium chloride 75 mL/hr at 11/28/19 1025     LOS: 0 days   Time spent= 35 mins    Tarrin Lebow Joline Maxcy, MD [facility]  If 7PM-7AM, please contact night-coverage  11/28/2019, 11:08 AM

## 2019-11-28 NOTE — Evaluation (Signed)
Occupational Therapy Evaluation Patient Details Name: Monique Holland MRN: [id number] DOB: 09-Jun-1965 Today's Date: 11/28/2019    History of Present Illness 54 y.o. female with history of diabetes mellitus type 2, hypertension, hyperlipidemia, bipolar disorder was admitted 2 weeks ago for DKA when patient had missed her doses of insulin presents to the ER at [facility] with complaint of right-sided weakness. CT and MRI of head negative for acute changes. Recent admission 09/29/19 also for R sided weakness which resolved.   Clinical Impression   Pt admitted with the above diagnoses and presents with below problem list. Pt will benefit from continued acute OT to address the below listed deficits and maximize independence with basic ADLs prior to d/c to venue below. PTA pt reports she was mod I with ADLs, lives with her son. Pt is currently mod A with LB ADLs and transfers, min guard with mobility utilizing rw.     Follow Up Recommendations  Home health OT;Supervision - Intermittent (for mobility and LB ADLs)    Equipment Recommendations  3 in 1 bedside commode    Recommendations for Other Services       Precautions / Restrictions Precautions Precautions: Fall Precaution Comments: denies falls in past 1 year Restrictions Weight Bearing Restrictions: No      Mobility Bed Mobility Overal bed mobility: Modified Independent Bed Mobility: Supine to Sit     Supine to sit: Modified independent (Device/Increase time);HOB elevated     General bed mobility comments: used rail, no assist needed  Transfers Overall transfer level: Needs assistance Equipment used: Rolling walker (2 wheeled) Transfers: Sit to/from Stand Sit to Stand: From elevated surface;Mod assist         General transfer comment: VCs for hand placement, mod A to power up    Balance Overall balance assessment: Needs assistance   Sitting balance-Leahy Scale: Good       Standing balance-Leahy Scale:  Fair                             ADL either performed or assessed with clinical judgement   ADL Overall ADL's : Needs assistance/impaired Eating/Feeding: Set up;Sitting   Grooming: Set up;Sitting   Upper Body Bathing: Set up;Sitting   Lower Body Bathing: Moderate assistance;Sit to/from stand   Upper Body Dressing : Sitting;Set up   Lower Body Dressing: Moderate assistance;Sit to/from stand   Toilet Transfer: Moderate assistance;Ambulation;RW   Toileting- Clothing Manipulation and Hygiene: Moderate assistance;Sit to/from stand   Tub/ Engineer, structural: Moderate assistance   Functional mobility during ADLs: Min guard;Rolling walker General ADL Comments: Pt completed bed mobility, household distance functional mobility and up to recliner at end of session     Vision Baseline Vision/History: Wears glasses Wears Glasses: Reading only Patient Visual Report: No change from baseline       Perception     Praxis      Pertinent Vitals/Pain Pain Assessment: No/denies pain     Hand Dominance Right   Extremity/Trunk Assessment Upper Extremity Assessment Upper Extremity Assessment: Overall WFL for tasks assessed;Generalized weakness (improved performance during functional observation)   Lower Extremity Assessment Lower Extremity Assessment: Defer to PT evaluation RLE Deficits / Details: ankle DF -3/5, knee ext +2/5 can actively extend to ~ -30*, hip flexion 3/5; sensation absent to light touch R foot, decreased sensation to light touch R thigh and shin RLE Sensation: decreased light touch   Cervical / Trunk Assessment Cervical / Trunk  Assessment: Normal   Communication Communication Communication: No difficulties   Cognition Arousal/Alertness: Awake/alert Behavior During Therapy: Flat affect Overall Cognitive Status: Within Functional Limits for tasks assessed                                     General Comments       Exercises      Shoulder Instructions      Home Living Family/patient expects to be discharged to:: Private residence Living Arrangements: Children Available Help at Discharge: Family;Available PRN/intermittently Type of Home: Apartment Home Access: Level entry     Home Layout: One level     Bathroom Shower/Tub: Chief Strategy Officer: Handicapped height Bathroom Accessibility: Yes How Accessible: Accessible via walker Home Equipment: Cane - single point   Additional Comments: lives with her son, he is not available 24/7      Prior Functioning/Environment Level of Independence: Independent with assistive device(s)        Comments: used SPC when going out, drives, gets own groceries        OT Problem List:        OT Treatment/Interventions: Self-care/ADL training;Therapeutic exercise;Neuromuscular education;DME and/or AE instruction;Therapeutic activities;Balance training;Patient/family education    OT Goals(Current goals can be found in the care plan section) Acute Rehab OT Goals Patient Stated Goal: return to living independently OT Goal Formulation: With patient Time For Goal Achievement: 12/12/19 Potential to Achieve Goals: Good  OT Frequency: Min 2X/week   Barriers to D/C: Decreased caregiver support          Co-evaluation PT/OT/SLP Co-Evaluation/Treatment: Yes Reason for Co-Treatment: For patient/therapist safety   OT goals addressed during session: ADL's and self-care      AM-PAC OT "6 Clicks" Daily Activity     Outcome Measure Help from another person eating meals?: None Help from another person taking care of personal grooming?: None Help from another person toileting, which includes using toliet, bedpan, or urinal?: A Little Help from another person bathing (including washing, rinsing, drying)?: A Little Help from another person to put on and taking off regular upper body clothing?: None Help from another person to put on and taking off regular  lower body clothing?: A Little 6 Click Score: 21   End of Session Equipment Utilized During Treatment: Gait belt;Rolling walker  Activity Tolerance: Patient tolerated treatment well Patient left: in chair;with call bell/phone within reach;with chair alarm set  OT Visit Diagnosis: Unsteadiness on feet (R26.81);Muscle weakness (generalized) (M62.81)                Time: 8768-1157 OT Time Calculation (min): 22 min Charges:  OT General Charges $OT Visit: 1 Visit OT Evaluation $OT Eval Low Complexity: 1 Low  Raynald Kemp, OT Acute Rehabilitation Services Pager: [phone] Office: [phone]   Pilar Grammes 11/28/2019, 1:10 PM

## 2019-11-28 NOTE — Consult Note (Addendum)
Neurology Consultation  Reason for Consult: Right-sided weakness Referring Physician: Dimple Nanas, MD  CC: Right-sided weakness  History is obtained from: Patient  HPI: Monique Holland is a 54 y.o. female with history of schizophrenia, pancreatitis, obstructive sleep apnea, depression, bipolar disease.  Patient initially presented to the ER at [facility] with complaints of right-sided weakness.  She states that she was watching TV around 3 PM when suddenly she noted weakness and numbness that started her foot and went up her leg and up her arm.  Looking through the notes, primary doctor's exam showed right upper and lower extremity of 3/5 with left upper and lower extremity 5/5.  Patient states that this morning however her arm suddenly improved but her leg still is slightly weak.  Speaking to the nurse was in the room apparently she was able to walk to the bathroom by herself this morning. Patient was admitted with a similar episode 2 months ago and was treated with IV TPA and made full recovery and MRI was negative for stroke.  She was felt to have strokelike episode with nonorganic pattern of weakness.  On inquiry patient is admit to being under significant stress at home because of differences between her and her husband.  She denies any abuse   Past Medical History:  Diagnosis Date  . Anxiety   . Asthma   . Bipolar disorder (HCC)   . Depression   . Diabetes mellitus without complication (HCC)   . Frequency of urination   . GERD (gastroesophageal reflux disease)   . History of asthma    last episode yrs ago  . Hypertension   . OSA (obstructive sleep apnea)    pt had study done oct 2014--  schedule for cpap titrate after kidney stone surgery  . Pancreatitis   . Pseudocyst of pancreas   . Schizophrenia (HCC)   . Ureteral calculi    BILATERAL  . Wears glasses      Family History  Family history unknown: Yes   Social History:   reports that she has never  smoked. She has never used smokeless tobacco. She reports that she does not drink alcohol and does not use drugs.  Medications  Current Facility-Administered Medications:  .  0.9 %  sodium chloride infusion, , Intravenous, Continuous, Eduard Clos, MD, Last Rate: 75 mL/hr at 11/28/19 1025, New Bag at 11/28/19 1025 .  acetaminophen (TYLENOL) tablet 650 mg, 650 mg, Oral, Q4H PRN **OR** acetaminophen (TYLENOL) 160 MG/5ML solution 650 mg, 650 mg, Per Tube, Q4H PRN **OR** acetaminophen (TYLENOL) suppository 650 mg, 650 mg, Rectal, Q4H PRN, Eduard Clos, MD .  amitriptyline (ELAVIL) tablet 75 mg, 75 mg, Oral, QHS, Midge Minium N, MD .  amLODipine (NORVASC) tablet 5 mg, 5 mg, Oral, Daily, Eduard Clos, MD, 5 mg at 11/28/19 1023 .  ARIPiprazole (ABILIFY) tablet 2 mg, 2 mg, Oral, Daily, Eduard Clos, MD, 2 mg at 11/28/19 1024 .  aspirin suppository 300 mg, 300 mg, Rectal, Daily **OR** aspirin tablet 325 mg, 325 mg, Oral, Daily, Midge Minium N, MD .  atorvastatin (LIPITOR) tablet 40 mg, 40 mg, Oral, q1800, Eduard Clos, MD .  clonazePAM [facility]) tablet 0.5 mg, 0.5 mg, Oral, Daily, Eduard Clos, MD, 0.5 mg at 11/28/19 1024 .  enoxaparin (LOVENOX) injection 40 mg, 40 mg, Subcutaneous, Q24H, Eduard Clos, MD, 40 mg at 11/28/19 0657 .  insulin aspart (novoLOG) injection 0-9 Units, 0-9 Units, Subcutaneous, TID WC,  Eduard Clos, MD .  insulin glargine (LANTUS) injection 60 Units, 60 Units, Subcutaneous, BID, Eduard Clos, MD, 60 Units at 11/28/19 1024 .  lamoTRIgine (LAMICTAL) tablet 200 mg, 200 mg, Oral, Daily, Eduard Clos, MD, 200 mg at 11/28/19 1025 .  lurasidone (LATUDA) tablet 80 mg, 80 mg, Oral, QHS, Kakrakandy, Arshad N, MD .  polyethylene glycol (MIRALAX / GLYCOLAX) packet 17 g, 17 g, Oral, Daily PRN, Amin, Ankit Chirag, MD .  propranolol (INDERAL) tablet 20 mg, 20 mg, Oral, BID, Eduard Clos, MD, 20 mg  at 11/28/19 1025 .  risperiDONE (RISPERDAL) tablet 0.5 mg, 0.5 mg, Oral, QHS, Eduard Clos, MD .  senna-docusate (Senokot-S) tablet 2 tablet, 2 tablet, Oral, QHS PRN, Amin, Ankit Chirag, MD .  traZODone (DESYREL) tablet 100 mg, 100 mg, Oral, QHS, Kakrakandy, Meryle Ready, MD  ROS:  .   General ROS: negative for - chills, fatigue, fever, night sweats, weight gain or weight loss Psychological ROS: negative for - behavioral disorder, hallucinations, memory difficulties, mood swings or suicidal ideation Ophthalmic ROS: negative for - blurry vision, double vision, eye pain or loss of vision ENT ROS: negative for - epistaxis, nasal discharge, oral lesions, sore throat, tinnitus or vertigo Allergy and Immunology ROS: negative for - hives or itchy/watery eyes Hematological and Lymphatic ROS: negative for - bleeding problems, bruising or swollen lymph nodes Endocrine ROS: negative for - galactorrhea, hair pattern changes, polydipsia/polyuria or temperature intolerance Respiratory ROS: negative for - cough, hemoptysis, shortness of breath or wheezing Cardiovascular ROS: negative for - chest pain, dyspnea on exertion, edema or irregular heartbeat Gastrointestinal ROS: negative for - abdominal pain, diarrhea, hematemesis, nausea/vomiting or stool incontinence Genito-Urinary ROS: negative for - dysuria, hematuria, incontinence or urinary frequency/urgency Musculoskeletal ROS: Positive for -  muscular weakness Neurological ROS: as noted in HPI Dermatological ROS: negative for rash and skin lesion changes  Exam: Current vital signs: BP 114/66 (BP Location: Left Arm)   Pulse 85   Temp 98.6 F (37 C) (Oral)   Resp 18   LMP 03/23/2014 Comment: (-) u preg?/ac  SpO2 97%  Vital signs in last 24 hours: Temp:  [97.5 F (36.4 C)-99 F (37.2 C)] 98.6 F (37 C) (07/08 1226) Pulse Rate:  [85-95] 85 (07/08 1226) Resp:  [12-20] 18 (07/08 1226) BP: (98-136)/(66-83) 114/66 (07/08 1226) SpO2:  [97 %-100  %] 97 % (07/08 1226)   Constitutional: Mildly obese middle-aged African-American lady not in distress psych: Flat affect Eyes: No scleral injection HENT: No OP obstrucion Head: Normocephalic.  Cardiovascular: Normal rate and regular rhythm.  Respiratory: Effort normal, non-labored breathing GI: Soft.  No distension. There is no tenderness.  Skin: WDI  Neuro: Mental Status: Patient is awake, alert, oriented to person, place, month, year, and situation.  Speech is clear with no aphasia.  Naming, repeating, comprehension is intact.  Patient is able to follow all commands to the best of her ability  Cranial Nerves: II: Visual Fields are full.  III,IV, VI: EOMI without ptosis or diploplia. Pupils equal, round and reactive to light V: Facial sensation is symmetric to temperature VII: Facial movement is symmetric.  VIII: hearing is intact to voice X: Palat elevates symmetrically XI: Shoulder shrug is symmetric. XII: tongue is midline without atrophy or fasciculations.  Motor: Tone is normal. Bulk is normal.  Right upper extremity 5/5, left upper extremity 5/5, left lower extremity 5/5, patient is able to straight raise her right leg approximately 4 inches off the bed but states  she cannot go any further however, this is inconsistent.  When asked patient flex at the hip she is able to do so which showing initial 5/5 strength but then give way to the bed.  With dorsiflexion/plantarflexion I do not feel like she is giving full effort.  When patient was asked to get to the bed, she was able to stand up from a seated position using both legs, walk to her bed, in addition she was able to bend her legs from a standing position and slowly sit down on the bed.  While walking to her bed she was able to use her right ankle strength with plantar flexion while walking to her bed.  Sensory: Sensation is symmetric to light touch and temperature in the arms and legs.   Deep Tendon Reflexes: 2+ bilateral  upper extremity no knee jerks Plantars: Toes are downgoing bilaterally.  Cerebellar: FNF within normal limits.  Due to body habitus could not attempt to do heel-to-shin  Labs I have reviewed labs in epic and the results pertinent to this consultation are:   CBC    Component Value Date/Time   WBC 5.2 11/28/2019 0813   RBC 4.49 11/28/2019 0813   HGB 9.8 (L) 11/28/2019 0813   HCT 33.8 (L) 11/28/2019 0813   PLT 211 11/28/2019 0813   MCV 75.3 (L) 11/28/2019 0813   MCH 21.8 (L) 11/28/2019 0813   MCHC 29.0 (L) 11/28/2019 0813   RDW 17.8 (H) 11/28/2019 0813   LYMPHSABS 2.3 11/27/2019 2007   MONOABS 0.6 11/27/2019 2007   EOSABS 0.2 11/27/2019 2007   BASOSABS 0.0 11/27/2019 2007    CMP     Component Value Date/Time   NA 136 11/27/2019 2007   K 4.1 11/27/2019 2007   CL 98 11/27/2019 2007   CO2 26 11/27/2019 2007   GLUCOSE 217 (H) 11/27/2019 2007   BUN 19 11/27/2019 2007   BUN 14 11/11/2019 1053   CREATININE 0.87 11/28/2019 0813   CALCIUM 8.8 (L) 11/27/2019 2007   CALCIUM 11.5 (H) 09/03/2018 0935   PROT 6.9 11/27/2019 2007   ALBUMIN 3.6 11/27/2019 2007   AST 22 11/27/2019 2007   ALT 16 11/27/2019 2007   ALKPHOS 50 11/27/2019 2007   BILITOT 0.3 11/27/2019 2007   GFRNONAA >60 11/28/2019 0813   GFRAA >60 11/28/2019 0813    Lipid Panel     Component Value Date/Time   CHOL 144 11/28/2019 0813   TRIG 112 11/28/2019 0813   HDL 64 11/28/2019 0813   CHOLHDL 2.3 11/28/2019 0813   VLDL 22 11/28/2019 0813   LDLCALC 58 11/28/2019 0813   Hemoglobin A1c-11.9  Imaging I have reviewed the images obtained:  CT-scan of the brain-normal head CT  CTA head neck-no emergent large vessel occlusion or high-grade stenosis of the intracranial or cervical arteries.  Mild right carotid bifurcation atherosclerosis calcification without hemodynamically significant stenosis  MRI examination of the brain-negative MRI of brain  Felicie Morn PA-C [facility] [phone]  M-F   (9:00 am- 5:00 PM)  11/28/2019, 12:46 PM   I Delia Heady, MD Medical Director [facility] Pager: [phone] 11/28/2019 3:46 PM   Assessment:  Is a 54 year old female presenting to [facility] ER secondary to new onset right-sided weakness.  Exams that have been done between primary and neurology have been inconsistent.  Exam done by neurology shows full 5/5 strength in the upper right extremities with inconsistent strength in the lower extremity on the right as above.  Neurological imaging  have all been negative showing no etiology for her weakness.  Given her inconsistencies with exam and negative diagnostic tests likely functional right-sided weakness.  Impression:  -Right-sided weakness with inconsistent exam, question functional  Recommendations: -Physical therapy -Agree with psychiatric evaluation   have personally obtained history,examined this patient, reviewed notes, independently viewed imaging studies, participated in medical decision making and plan of care.ROS completed by me personally and pertinent positives fully documented  I have made any additions or clarifications directly to the above note. Agree with note above.  She presented with recurrent right-sided weakness with nonorganic pattern with negative MRI x2.  She admits to underlying psychosocial distress which may be contributing.  I do not recommend any further stroke work-up.  Patient counseled about stress management and she may need outpatient referral to behavioral health for management of anxiety and stress.  Discussed with Dr. Nelson ChimesAmin.  Greater than 50% time during this 80-minute consultation visit was spent on counseling and coordination of care about her subjective weakness discussion number stroke and stressors and answering questions Delia HeadyPramod Melaysia Streed, MD

## 2019-11-28 NOTE — Evaluation (Addendum)
Physical Therapy Evaluation Patient Details Name: Monique Holland MRN: [id number] DOB: 1966/01/06 Today's Date: 11/28/2019   History of Present Illness  54 y.o. female with history of diabetes mellitus type 2, hypertension, hyperlipidemia, bipolar disorder was admitted 2 weeks ago for DKA when patient had missed her doses of insulin presents to the ER at [facility] with complaint of right-sided weakness. CT and MRI of head negative for acute changes. Recent admission 09/29/19 also for R sided weakness which resolved.  Clinical Impression  Pt admitted with above diagnosis. Pt ambulated 90' with RW, no loss of balance, no buckling of LEs. With manual muscle testing, pt has significant weakness with R knee extension, R ankle dorsiflexion, and R hip flexion, these strength deficits did not translate into functional deficits when ambulating (no foot drop nor buckling of RLE). Rolling walker recommended for safety when ambulating.  Pt currently with functional limitations due to the deficits listed below (see PT Problem List). Pt will benefit from skilled PT to increase their independence and safety with mobility to allow discharge to the venue listed below.       Follow Up Recommendations Home health PT; supervision for mobility    Equipment Recommendations  Rolling walker with 5" wheels    Recommendations for Other Services       Precautions / Restrictions Precautions Precautions: Fall Precaution Comments: denies falls in past 1 year Restrictions Weight Bearing Restrictions: No      Mobility  Bed Mobility Overal bed mobility: Modified Independent Bed Mobility: Supine to Sit     Supine to sit: Modified independent (Device/Increase time);HOB elevated     General bed mobility comments: used rail, no assist needed  Transfers Overall transfer level: Needs assistance Equipment used: Rolling walker (2 wheeled) Transfers: Sit to/from Stand Sit to Stand: From elevated  surface;Mod assist         General transfer comment: VCs for hand placement, mod A to power up  Ambulation/Gait Ambulation/Gait assistance: Min guard Gait Distance (Feet): 90 Feet Assistive device: Rolling walker (2 wheeled) Gait Pattern/deviations: Step-through pattern;Wide base of support;Decreased stride length Gait velocity: decr   General Gait Details: BLEs externally rotated, no loss of balance, no foot drop, no buckling  Stairs            Wheelchair Mobility    Modified Rankin (Stroke Patients Only)       Balance Overall balance assessment: Needs assistance   Sitting balance-Leahy Scale: Good       Standing balance-Leahy Scale: Fair                               Pertinent Vitals/Pain Pain Assessment: No/denies pain    Home Living Family/patient expects to be discharged to:: Private residence Living Arrangements: Children Available Help at Discharge: Family;Available PRN/intermittently Type of Home: Apartment Home Access: Level entry     Home Layout: One level Home Equipment: Cane - single point Additional Comments: lives with her son, he is not available 24/7    Prior Function Level of Independence: Independent with assistive device(s)         Comments: used SPC when going out, drives, gets own Risk analyst        Extremity/Trunk Assessment   Upper Extremity Assessment Upper Extremity Assessment: Defer to OT evaluation    Lower Extremity Assessment Lower Extremity Assessment: RLE deficits/detail RLE Deficits / Details: ankle DF -3/5, knee  ext +2/5 can actively extend to ~ -30*, hip flexion 3/5; sensation absent to light touch R foot, decreased sensation to light touch R thigh and shin RLE Sensation: decreased light touch    Cervical / Trunk Assessment Cervical / Trunk Assessment: Normal  Communication   Communication: No difficulties  Cognition Arousal/Alertness: Awake/alert Behavior During  Therapy: WFL for tasks assessed/performed Overall Cognitive Status: Within Functional Limits for tasks assessed                                        General Comments      Exercises     Assessment/Plan    PT Assessment Patient needs continued PT services  PT Problem List Decreased strength;Decreased mobility;Impaired sensation       PT Treatment Interventions Gait training;Therapeutic exercise;Therapeutic activities;Patient/family education;Functional mobility training    PT Goals (Current goals can be found in the Care Plan section)  Acute Rehab PT Goals Patient Stated Goal: return to living independently PT Goal Formulation: With patient Time For Goal Achievement: 12/12/19 Potential to Achieve Goals: Good    Frequency Min 3X/week   Barriers to discharge        Co-evaluation               AM-PAC PT "6 Clicks" Mobility  Outcome Measure Help needed turning from your back to your side while in a flat bed without using bedrails?: None Help needed moving from lying on your back to sitting on the side of a flat bed without using bedrails?: A Little Help needed moving to and from a bed to a chair (including a wheelchair)?: A Little Help needed standing up from a chair using your arms (e.g., wheelchair or bedside chair)?: A Little Help needed to walk in hospital room?: None Help needed climbing 3-5 steps with a railing? : A Little 6 Click Score: 20    End of Session Equipment Utilized During Treatment: Gait belt Activity Tolerance: Patient tolerated treatment well;No increased pain Patient left: in chair;with call bell/phone within reach;with chair alarm set Nurse Communication: Mobility status PT Visit Diagnosis: Muscle weakness (generalized) (M62.81)    Time: 3545-6256 PT Time Calculation (min) (ACUTE ONLY): 24 min   Charges:   PT Evaluation $PT Eval Low Complexity: 1 Low         Ralene Bathe Kistler PT 11/28/2019  Acute  Rehabilitation Services Pager [phone] Office [phone]

## 2019-11-28 NOTE — H&P (Signed)
History and Physical    Monique Holland ZOX:[id number] DOB: [birth year] DOA: 11/27/2019  PCP: Knox Royalty, MD  Patient coming from: Home.  Chief Complaint: Right-sided weakness.  HPI: Monique Holland is a 54 y.o. female with history of diabetes mellitus type 2, hypertension, hyperlipidemia, bipolar disorder was admitted 2 weeks ago for DKA when patient had missed her doses of insulin presents to the ER at [facility] with complaint of right-sided weakness.  Patient states she was watching TV and at around 3 PM yesterday started having sudden onset of right-sided weakness both upper and lower extremity with some tingling and numbness and was taken to the ER admits in [facility].  Denies any visual symptoms difficulty speaking swallowing.  ED Course: In the ER patient states she was unable to move the right upper or lower extremity emergent CT head followed by CT angiogram of the head and neck was done which did not show any large vessel obstruction.  Tele neurology was consulted and felt that patient was outside the window for TPA and patient's neurological exam also was inconsistent.  Patient has been admitted for further observation to get further studies including MRI brain and CT perfusion study.  Labs are largely at baseline except for creatinine increased from baseline of normal and is around 1.05.  Blood sugar is 217.  Hemoglobin is at baseline of 9.5.  EKG shows sinus tachycardia with prolonged QTC.  Covid test was negative.  Review of Systems: As per HPI, rest all negative.   Past Medical History:  Diagnosis Date  . Anxiety   . Asthma   . Bipolar disorder (HCC)   . Depression   . Diabetes mellitus without complication (HCC)   . Frequency of urination   . GERD (gastroesophageal reflux disease)   . History of asthma    last episode yrs ago  . Hypertension   . OSA (obstructive sleep apnea)    pt had study done oct 2014--  schedule for cpap titrate after kidney stone  surgery  . Pancreatitis   . Pseudocyst of pancreas   . Schizophrenia (HCC)   . Ureteral calculi    BILATERAL  . Wears glasses     Past Surgical History:  Procedure Laterality Date  . BALLOON DILATION N/A 08/23/2019   Procedure: BALLOON DILATION;  Surgeon: Meridee Score Netty Starring., MD;  Location: [facility];  Service: Gastroenterology;  Laterality: N/A;  . BIOPSY  07/18/2019   Procedure: BIOPSY;  Surgeon: Shellia Cleverly, DO;  Location: [facility];  Service: Gastroenterology;;  . BIOPSY  08/23/2019   Procedure: BIOPSY;  Surgeon: Lemar Lofty., MD;  Location: [facility];  Service: Gastroenterology;;  . BIOPSY  09/18/2019   Procedure: BIOPSY;  Surgeon: Lemar Lofty., MD;  Location: [facility];  Service: Gastroenterology;;  . BIOPSY  10/18/2019   Procedure: BIOPSY;  Surgeon: Lemar Lofty., MD;  Location: [facility];  Service: Gastroenterology;;  . CESAREAN SECTION  1991  &  2002   w/ bilateral tubal ligation in 2002  . CYSTOSCOPY W/ URETERAL STENT PLACEMENT Right 08/10/2018   Procedure: CYSTOSCOPY WITH RETROGRADE PYELOGRAM/URETERAL STENT PLACEMENT;  Surgeon: Jerilee Field, MD;  Location: [facility];  Service: Urology;  Laterality: Right;  . CYSTOSCOPY WITH RETROGRADE PYELOGRAM, URETEROSCOPY AND STENT PLACEMENT Bilateral 05/02/2013   Procedure: CYSTOSCOPY WITH RETROGRADE PYELOGRAM, URETEROSCOPY ;  Surgeon: Valetta Fuller, MD;  Location: [facility];  Service: Urology;  Laterality: Bilateral;  . CYSTOSCOPY WITH RETROGRADE PYELOGRAM,  URETEROSCOPY AND STENT PLACEMENT Right 08/15/2018   Procedure: CYSTOSCOPY WITH RIGHT RETROGRADE PYELOGRAM, RIGHT URETEROSCOPY WITH HOLMIUM LASER AND STENT PLACEMENT;  Surgeon: Crista Elliot, MD;  Location: [facility];  Service: Urology;  Laterality: Right;  . CYSTOSCOPY WITH STENT PLACEMENT Left 05/02/2013   Procedure: CYSTOSCOPY WITH STENT PLACEMENT;  Surgeon: Valetta Fuller, MD;  Location: [facility];  Service: Urology;  Laterality: Left;  . ENDOROTOR N/A 08/26/2019   Procedure: [id number];  Surgeon: Mansouraty, Netty Starring., MD;  Location: [facility] ENDOSCOPY;  Service: Gastroenterology;  Laterality: N/A;  . ENDOROTOR  09/05/2019   Procedure: [id number];  Surgeon: Mansouraty, Netty Starring., MD;  Location: [facility] ENDOSCOPY;  Service: Gastroenterology;;  . ESOPHAGOGASTRODUODENOSCOPY N/A 08/26/2019   Procedure: ESOPHAGOGASTRODUODENOSCOPY (EGD) with NECROSECTOMY;  Surgeon: Lemar Lofty., MD;  Location: [facility] ENDOSCOPY;  Service: Gastroenterology;  Laterality: N/A;  . ESOPHAGOGASTRODUODENOSCOPY N/A 10/18/2019   Procedure: ESOPHAGOGASTRODUODENOSCOPY (EGD);  Surgeon: Lemar Lofty., MD;  Location: [facility];  Service: Gastroenterology;  Laterality: N/A;  . ESOPHAGOGASTRODUODENOSCOPY (EGD) WITH PROPOFOL N/A 07/18/2019   Procedure: ESOPHAGOGASTRODUODENOSCOPY (EGD) WITH PROPOFOL;  Surgeon: Shellia Cleverly, DO;  Location: [facility];  Service: Gastroenterology;  Laterality: N/A;  . ESOPHAGOGASTRODUODENOSCOPY (EGD) WITH PROPOFOL N/A 08/18/2019   Procedure: ESOPHAGOGASTRODUODENOSCOPY (EGD) WITH PROPOFOL;  Surgeon: Hilarie Fredrickson, MD;  Location: [facility];  Service: Endoscopy;  Laterality: N/A;  . ESOPHAGOGASTRODUODENOSCOPY (EGD) WITH PROPOFOL N/A 08/21/2019   Procedure: ESOPHAGOGASTRODUODENOSCOPY (EGD) WITH PROPOFOL;  Surgeon: Meridee Score Netty Starring., MD;  Location: [facility];  Service: Gastroenterology;  Laterality: N/A;  . ESOPHAGOGASTRODUODENOSCOPY (EGD) WITH PROPOFOL N/A 08/23/2019   Procedure: ESOPHAGOGASTRODUODENOSCOPY (EGD) WITH PROPOFOL;  Surgeon: Meridee Score Netty Starring., MD;  Location: [facility];  Service: Gastroenterology;  Laterality: N/A;  . ESOPHAGOGASTRODUODENOSCOPY (EGD) WITH PROPOFOL N/A 09/05/2019   Procedure: ESOPHAGOGASTRODUODENOSCOPY (EGD) WITH PROPOFOL;  Surgeon: Meridee Score Netty Starring., MD;  Location: [facility] ENDOSCOPY;  Service: Gastroenterology;  Laterality: N/A;   With  pancreatic pseudocyst necrosectomy via established cyst gastrostomy  . ESOPHAGOGASTRODUODENOSCOPY (EGD) WITH PROPOFOL N/A 09/18/2019   Procedure: ESOPHAGOGASTRODUODENOSCOPY (EGD) WITH PROPOFOL;  Surgeon: Meridee Score Netty Starring., MD;  Location: [facility];  Service: Gastroenterology;  Laterality: N/A;  . EUS  08/23/2019   Procedure: UPPER ENDOSCOPIC ULTRASOUND (EUS) LINEAR;  Surgeon: Lemar Lofty., MD;  Location: [facility];  Service: Gastroenterology;;  . FOREIGN BODY REMOVAL  09/05/2019   Procedure: FOREIGN BODY REMOVAL;  Surgeon: Lemar Lofty., MD;  Location: [facility] ENDOSCOPY;  Service: Gastroenterology;;  . FOREIGN BODY REMOVAL  09/18/2019   Procedure: FOREIGN BODY REMOVAL;  Surgeon: Lemar Lofty., MD;  Location: [facility];  Service: Gastroenterology;;  . HOLMIUM LASER APPLICATION Bilateral 05/02/2013   Procedure: HOLMIUM LASER APPLICATION;  Surgeon: Valetta Fuller, MD;  Location: [facility];  Service: Urology;  Laterality: Bilateral;  . LAPAROSCOPIC APPENDECTOMY N/A 05/22/2014   Procedure: APPENDECTOMY LAPAROSCOPIC;  Surgeon: Glenna Fellows, MD;  Location: [facility];  Service: General;  Laterality: N/A;  . PANCREATIC STENT PLACEMENT  08/23/2019   Procedure: PANCREATIC STENT PLACEMENT;  Surgeon: Lemar Lofty., MD;  Location: [facility];  Service: Gastroenterology;;  . PANCREATIC STENT PLACEMENT  09/05/2019   Procedure: PANCREATIC STENT PLACEMENT;  Surgeon: Lemar Lofty., MD;  Location: [facility] ENDOSCOPY;  Service: Gastroenterology;;  . PANCREATIC STENT PLACEMENT  09/18/2019   Procedure: PANCREATIC STENT PLACEMENT;  Surgeon: Lemar Lofty., MD;  Location: [facility];  Service: Gastroenterology;;  cyst gastrostomy double pigtail stent placement   . STENT REMOVAL  08/26/2019  Procedure: STENT REMOVAL;  Surgeon: Lemar Lofty., MD;  Location: [facility] END[facility]COPY;  Service: Gastroenterology;;  . Francine Graven REMOVAL  09/05/2019    Procedure: STENT REMOVAL;  Surgeon: Lemar Lofty., MD;  Location: [facility] ENDOSCOPY;  Service: Gastroenterology;;  . Francine Graven REMOVAL  09/18/2019   Procedure: STENT REMOVAL;  Surgeon: Lemar Lofty., MD;  Location: [facility];  Service: Gastroenterology;;  . TONSILLECTOMY  age 57  . UPPER ESOPHAGEAL ENDOSCOPIC ULTRASOUND (EUS) N/A 08/21/2019   Procedure: UPPER ESOPHAGEAL ENDOSCOPIC ULTRASOUND (EUS);  Surgeon: Lemar Lofty., MD;  Location: [facility];  Service: Gastroenterology;  Laterality: N/A;  For cyst gastrostomy     reports that she has never smoked. She has never used smokeless tobacco. She reports that she does not drink alcohol and does not use drugs.  No Known Allergies  Family History  Family history unknown: Yes    Prior to Admission medications   Medication Sig Start Date End Date Taking? Authorizing Provider  albuterol (VENTOLIN HFA) 108 (90 Base) MCG/ACT inhaler Inhale 1-2 puffs into the lungs every 6 (six) hours as needed for wheezing or shortness of breath. 11/04/19   Rancour, Jeannett Senior, MD  amitriptyline (ELAVIL) 75 MG tablet Take 75 mg by mouth at bedtime. 08/10/19   [provider]  amLODipine (NORVASC) 5 MG tablet Take 5 mg by mouth daily.    [provider]  ARIPiprazole (ABILIFY) 2 MG tablet Take 2 mg by mouth daily. 10/22/19   [provider]  atorvastatin (LIPITOR) 40 MG tablet Take 40 mg by mouth daily.    [provider]  clonazePAM (KLONOPIN) 0.5 MG tablet Take 0.5 mg by mouth daily.  05/23/19   [provider]  diclofenac (VOLTAREN) 75 MG EC tablet Take 75 mg by mouth 2 (two) times daily. 10/30/19   [provider]  Ensure Max Protein (ENSURE MAX PROTEIN) LIQD Take 330 mLs (11 oz total) by mouth 2 (two) times daily. 11/17/19   Swayze, Ava, DO  JANUMET 50-1000 MG tablet Take 1 tablet by mouth 2 (two) times daily. 09/24/19   [provider]  lamoTRIgine (LAMICTAL) 200 MG tablet Take 200 mg  by mouth daily. 10/30/19   [provider]  LANTUS SOLOSTAR 100 UNIT/ML Solostar Pen Inject 60 Units into the skin 2 (two) times daily. 11/17/19   Swayze, Ava, DO  LATUDA 80 MG TABS tablet Take 80 mg by mouth at bedtime. 11/06/19   [provider]  losartan (COZAAR) 25 MG tablet Take 25 mg by mouth daily.    [provider]  ondansetron (ZOFRAN) 8 MG tablet Take 1 tablet (8 mg total) by mouth every 8 (eight) hours as needed for nausea or vomiting. 11/17/19   Swayze, Ava, DO  Oxycodone HCl 10 MG TABS Take 10 mg by mouth every 4 (four) hours as needed (pain).  08/12/19   [provider]  pantoprazole (PROTONIX) 40 MG tablet TAKE 1 TABLET (40 MG TOTAL) BY MOUTH 2 (TWO) TIMES DAILY FOR 28 DAYS. 11/09/19 12/07/19  Reva Bores, MD  promethazine (PHENERGAN) 6.25 MG/5ML syrup Take 5 mLs by mouth 2 (two) times daily as needed for nausea/vomiting. 11/08/19   [provider]  propranolol (INDERAL) 20 MG tablet Take 20 mg by mouth 2 (two) times daily.    [provider]  risperiDONE (RISPERDAL) 0.5 MG tablet Take 0.5 mg by mouth at bedtime.  09/22/19   [provider]  traZODone (DESYREL) 100 MG tablet Take 100 mg by mouth at  bedtime.    [provider]    Physical Exam: Constitutional: Moderately built and nourished. Vitals:   11/28/19 0300 11/28/19 0325 11/28/19 0329 11/28/19 0424  BP: 117/76  111/72 136/83  Pulse:   90 89  Resp: 14  13 20   Temp:  98.1 F (36.7 C)  98 F (36.7 C)  TempSrc:  Oral  Oral  SpO2:   99% 99%   Eyes: Anicteric no pallor. ENMT: No discharge from the ears eyes nose or mouth. Neck: No mass felt.  No neck rigidity. Respiratory: No rhonchi or crepitations. Cardiovascular: S1-S2 heard. Abdomen: Soft nontender bowel sounds present. Musculoskeletal: No edema. Skin: No rash. Neurologic: Alert awake oriented to time place and person.  Patient states he is unable to move her right upper or lower extremity.  Some of  the exam is inconsistent.  Patient is able to resist my exam on the right side upper extremity.  Left upper and lower extremities 5 x 5.  No facial asymmetry tongue is midline pupils equal and reacting to light. Psychiatric: Appears normal.   Labs on Admission: I have personally reviewed following labs and imaging studies  CBC: Recent Labs  Lab 11/27/19 2007  WBC 6.4  NEUTROABS 3.2  HGB 9.5*  HCT 32.7*  MCV 76.2*  PLT 206   Basic Metabolic Panel: Recent Labs  Lab 11/27/19 2007  NA 136  K 4.1  CL 98  CO2 26  GLUCOSE 217*  BUN 19  CREATININE 1.05*  CALCIUM 8.8*   GFR: Estimated Creatinine Clearance: 92.7 mL/min (A) (by C-G formula based on SCr of 1.05 mg/dL (H)). Liver Function Tests: Recent Labs  Lab 11/27/19 2007  AST 22  ALT 16  ALKPHOS 50  BILITOT 0.3  PROT 6.9  ALBUMIN 3.6   No results for input(s): LIPASE, AMYLASE in the last 168 hours. No results for input(s): AMMONIA in the last 168 hours. Coagulation Profile: Recent Labs  Lab 11/27/19 2007  INR 0.9   Cardiac Enzymes: No results for input(s): CKTOTAL, CKMB, CKMBINDEX, TROPONINI in the last 168 hours. BNP (last 3 results) No results for input(s): PROBNP in the last 8760 hours. HbA1C: No results for input(s): HGBA1C in the last 72 hours. CBG: Recent Labs  Lab 11/27/19 1936 11/28/19 0548  GLUCAP 198* 82   Lipid Profile: No results for input(s): CHOL, HDL, LDLCALC, TRIG, CHOLHDL, LDLDIRECT in the last 72 hours. Thyroid Function Tests: No results for input(s): TSH, T4TOTAL, FREET4, T3FREE, THYROIDAB in the last 72 hours. Anemia Panel: No results for input(s): VITAMINB12, FOLATE, FERRITIN, TIBC, IRON, RETICCTPCT in the last 72 hours. Urine analysis:    Component Value Date/Time   COLORURINE YELLOW 11/28/2019 0021   APPEARANCEUR CLEAR 11/28/2019 0021   LABSPEC 1.010 11/28/2019 0021   PHURINE 6.0 11/28/2019 0021   GLUCOSEU NEGATIVE 11/28/2019 0021   HGBUR NEGATIVE 11/28/2019 0021    BILIRUBINUR NEGATIVE 11/28/2019 0021   KETONESUR NEGATIVE 11/28/2019 0021   PROTEINUR NEGATIVE 11/28/2019 0021   UROBILINOGEN 0.2 02/12/2015 1348   NITRITE NEGATIVE 11/28/2019 0021   LEUKOCYTESUR NEGATIVE 11/28/2019 0021   Sepsis Labs: @LABRCNTIP (procalcitonin:4,lacticidven:4) ) Recent Results (from the past 240 hour(s))  SARS Coronavirus 2 by RT PCR (hospital order, performed in [facility] hospital lab) Nasopharyngeal Nasopharyngeal Swab     Status: None   Collection Time: 11/27/19  8:08 PM   Specimen: Nasopharyngeal Swab  Result Value Ref Range Status   SARS Coronavirus 2 NEGATIVE NEGATIVE Final    Comment: (NOTE) SARS-CoV-2 target nucleic  acids are NOT DETECTED.  The SARS-CoV-2 RNA is generally detectable in upper and lower respiratory specimens during the acute phase of infection. The lowest concentration of SARS-CoV-2 viral copies this assay can detect is 250 copies / mL. A negative result does not preclude SARS-CoV-2 infection and should not be used as the sole basis for treatment or other patient management decisions.  A negative result may occur with improper specimen collection / handling, submission of specimen other than nasopharyngeal swab, presence of viral mutation(s) within the areas targeted by this assay, and inadequate number of viral copies (<250 copies / mL). A negative result must be combined with clinical observations, patient history, and epidemiological information.  Fact Sheet for Patients:   BoilerBrush.com.cy  Fact Sheet for Healthcare Providers: https://pope.com/  This test is not yet approved or  cleared by the [place] FDA and has been authorized for detection and/or diagnosis of SARS-CoV-2 by FDA under an Emergency Use Authorization (EUA).  This EUA will remain in effect (meaning this test can be used) for the duration of the COVID-19 declaration under Section 564(b)(1) of the Act, 21  U.S.C. section 360bbb-3(b)(1), unless the authorization is terminated or revoked sooner.  Performed at [facility], [address] [place], [place] [address]      Radiological Exams on Admission: CT HEAD CODE STROKE WO CONTRAST  Result Date: 11/27/2019 CLINICAL DATA:  Code stroke.  Right-sided numbness EXAM: CT HEAD WITHOUT CONTRAST TECHNIQUE: Contiguous axial images were obtained from the base of the skull through the vertex without intravenous contrast. COMPARISON:  None. FINDINGS: Brain: There is no mass, hemorrhage or extra-axial collection. The size and configuration of the ventricles and extra-axial CSF spaces are normal. The brain parenchyma is normal, without evidence of acute or chronic infarction. Vascular: No abnormal hyperdensity of the major intracranial arteries or dural venous sinuses. No intracranial atherosclerosis. Skull: The visualized skull base, calvarium and extracranial soft tissues are normal. Sinuses/Orbits: No fluid levels or advanced mucosal thickening of the visualized paranasal sinuses. No mastoid or middle ear effusion. The orbits are normal. ASPECTS [facility] Stroke Program Early CT Score) - Ganglionic level infarction (caudate, lentiform nuclei, internal capsule, insula, M1-M3 cortex): 7 - Supraganglionic infarction (M4-M6 cortex): 3 Total score (0-10 with 10 being normal): 10 IMPRESSION: 1. Normal head CT. 2. ASPECTS is 10. 3. These results were called by telephone at the time of interpretation on 11/27/2019 at 8:19 pm to provider [facility] , who verbally acknowledged these results. Electronically Signed   By: Deatra Robinson M.D.   On: 11/27/2019 20:19   CT ANGIO HEAD CODE STROKE  Result Date: 11/27/2019 CLINICAL DATA:  Right-sided numbness EXAM: CT ANGIOGRAPHY HEAD AND NECK TECHNIQUE: Multidetector CT imaging of the head and neck was performed using the standard protocol during bolus administration of intravenous contrast. Multiplanar CT image  reconstructions and MIPs were obtained to evaluate the vascular anatomy. Carotid stenosis measurements (when applicable) are obtained utilizing NASCET criteria, using the distal internal carotid diameter as the denominator. CONTRAST:  OMNIPAQUE IOHEXOL 350 MG/ML SOLN COMPARISON:  09/29/2019 CTA head neck FINDINGS: CTA NECK FINDINGS SKELETON: There is no bony spinal canal stenosis. No lytic or blastic lesion. OTHER NECK: Normal pharynx, larynx and major salivary glands. No cervical lymphadenopathy. Unremarkable thyroid gland. UPPER CHEST: No pneumothorax or pleural effusion. No nodules or masses. AORTIC ARCH: There is no calcific atherosclerosis of the aortic arch. There is no aneurysm, dissection or hemodynamically significant stenosis of the visualized portion of  the aorta. Conventional 3 vessel aortic branching pattern. The visualized proximal subclavian arteries are widely patent. RIGHT CAROTID SYSTEM: No dissection, occlusion or aneurysm. Mild atherosclerotic calcification at the carotid bifurcation without hemodynamically significant stenosis. LEFT CAROTID SYSTEM: Normal without aneurysm, dissection or stenosis. VERTEBRAL ARTERIES: Left dominant configuration. Both origins are clearly patent. There is no dissection, occlusion or flow-limiting stenosis to the skull base (V1-V3 segments). CTA HEAD FINDINGS POSTERIOR CIRCULATION: --Vertebral arteries: Normal V4 segments. --Inferior cerebellar arteries: Normal. --Basilar artery: Normal. --Superior cerebellar arteries: Normal. --Posterior cerebral arteries (PCA): Normal. ANTERIOR CIRCULATION: --Intracranial internal carotid arteries: Normal. --Anterior cerebral arteries (ACA): Normal. Both A1 segments are present. Patent anterior communicating artery (a-comm). --Middle cerebral arteries (MCA): Normal. VENOUS SINUSES: As permitted by contrast timing, patent. ANATOMIC VARIANTS: None Review of the MIP images confirms the above findings. IMPRESSION: 1. No emergent  large vessel occlusion or high-grade stenosis of the intracranial or cervical arteries. 2. Mild right carotid bifurcation atherosclerotic calcification without hemodynamically significant stenosis by NASCET criteria. Electronically Signed   By: Deatra Robinson M.D.   On: 11/27/2019 21:18   CT ANGIO NECK CODE STROKE  Result Date: 11/27/2019 CLINICAL DATA:  Right-sided numbness EXAM: CT ANGIOGRAPHY HEAD AND NECK TECHNIQUE: Multidetector CT imaging of the head and neck was performed using the standard protocol during bolus administration of intravenous contrast. Multiplanar CT image reconstructions and MIPs were obtained to evaluate the vascular anatomy. Carotid stenosis measurements (when applicable) are obtained utilizing NASCET criteria, using the distal internal carotid diameter as the denominator. CONTRAST:  OMNIPAQUE IOHEXOL 350 MG/ML SOLN COMPARISON:  09/29/2019 CTA head neck FINDINGS: CTA NECK FINDINGS SKELETON: There is no bony spinal canal stenosis. No lytic or blastic lesion. OTHER NECK: Normal pharynx, larynx and major salivary glands. No cervical lymphadenopathy. Unremarkable thyroid gland. UPPER CHEST: No pneumothorax or pleural effusion. No nodules or masses. AORTIC ARCH: There is no calcific atherosclerosis of the aortic arch. There is no aneurysm, dissection or hemodynamically significant stenosis of the visualized portion of the aorta. Conventional 3 vessel aortic branching pattern. The visualized proximal subclavian arteries are widely patent. RIGHT CAROTID SYSTEM: No dissection, occlusion or aneurysm. Mild atherosclerotic calcification at the carotid bifurcation without hemodynamically significant stenosis. LEFT CAROTID SYSTEM: Normal without aneurysm, dissection or stenosis. VERTEBRAL ARTERIES: Left dominant configuration. Both origins are clearly patent. There is no dissection, occlusion or flow-limiting stenosis to the skull base (V1-V3 segments). CTA HEAD FINDINGS POSTERIOR CIRCULATION:  --Vertebral arteries: Normal V4 segments. --Inferior cerebellar arteries: Normal. --Basilar artery: Normal. --Superior cerebellar arteries: Normal. --Posterior cerebral arteries (PCA): Normal. ANTERIOR CIRCULATION: --Intracranial internal carotid arteries: Normal. --Anterior cerebral arteries (ACA): Normal. Both A1 segments are present. Patent anterior communicating artery (a-comm). --Middle cerebral arteries (MCA): Normal. VENOUS SINUSES: As permitted by contrast timing, patent. ANATOMIC VARIANTS: None Review of the MIP images confirms the above findings. IMPRESSION: 1. No emergent large vessel occlusion or high-grade stenosis of the intracranial or cervical arteries. 2. Mild right carotid bifurcation atherosclerotic calcification without hemodynamically significant stenosis by NASCET criteria. Electronically Signed   By: Deatra Robinson M.D.   On: 11/27/2019 21:18    EKG: Independently reviewed.  Normal sinus rhythm prolonged QTC.  Assessment/Plan Principal Problem:   Right sided weakness Active Problems:   Type 2 diabetes mellitus (HCC)   Essential hypertension   OSA (obstructive sleep apnea)    1. Right-sided weakness -CT head and CT angiogram head and neck is unremarkable.  CT perfusion study and MRI brain is pending.  Patient's exams are inconsistent.  If MRI is positive further work-up for stroke.  Patient has passed swallow.  We will keep patient on neurochecks.  Patient has been placed on aspirin.  Patient already takes statins. 2. Acute renal failure on chronic kidney disease cause not clear we will hold off ARB and gently hydrate follow metabolic panel. 3. Diabetes mellitus type 2 on Lantus insulin 60 units twice daily with sliding scale coverage.  Patient states he has been compliant now. 4. Hypertension on amlodipine holding of Cozaar due to acute renal failure.  Allow for permissive hypertension. 5. Hyperlipidemia on statins. 6. History of bipolar disorder denies any suicidal ideation.   On Lamictal Latuda amitriptyline trazodone Risperdal Abilify. 7. Anemia appears to be chronic follow CBC.   DVT prophylaxis: Lovenox. Code Status: Full code. Family Communication: Discussed with patient. Disposition Plan: Home. Consults called: Neurology. Admission status: Observation.   Eduard ClosArshad N Ethelyne Erich MD [facility] Pager [phone].  If 7PM-7AM, please contact night-coverage [email or web address] Password [email or web address]  11/28/2019, 5:52 AM

## 2019-11-28 NOTE — Progress Notes (Signed)
SLP Cancellation Note  Patient Details Name: Monique Holland MRN: [id number] DOB: May 29, 1965   Cancelled evaluation:       Reason Eval/Treat Not Completed: MRI negative for acute stroke. SLP screened, no needs identified, will sign off  Thomson Herbers L. Samson Frederic, MA CCC/SLP Acute Rehabilitation Services Office number [phone] Pager [phone]  Blenda Mounts Laurice 11/28/2019, 2:58 PM

## 2019-11-28 NOTE — Progress Notes (Signed)
Admitted from [facility].  Notified hospitalist service and was told that Dr Toniann Fail will be up to see her.  During assessment, pt said she could not move her right side at all but did grab the siderail with her right hand when being turned and also pushed off her right leg to help pull self up in the bed.  Oriented to room.

## 2019-11-29 DIAGNOSIS — R531 Weakness: Secondary | ICD-10-CM | POA: Diagnosis not present

## 2019-11-29 DIAGNOSIS — I10 Essential (primary) hypertension: Secondary | ICD-10-CM | POA: Diagnosis not present

## 2019-11-29 LAB — BASIC METABOLIC PANEL
Anion gap: 7 (ref 5–15)
BUN: 12 mg/dL (ref 6–20)
CO2: 28 mmol/L (ref 22–32)
Calcium: 8.5 mg/dL — ABNORMAL LOW (ref 8.9–10.3)
Chloride: 105 mmol/L (ref 98–111)
Creatinine, Ser: 0.8 mg/dL (ref 0.44–1.00)
GFR calc Af Amer: >60 mL/min (ref >60)
GFR calc non Af Amer: >60 mL/min (ref >60)
Glucose, Bld: 142 mg/dL — ABNORMAL HIGH (ref 70–99)
Potassium: 4.4 mmol/L (ref 3.5–5.1)
Sodium: 140 mmol/L (ref 135–145)

## 2019-11-29 LAB — CBC
HCT: 30.7 % — ABNORMAL LOW (ref 36.0–46.0)
Hemoglobin: 8.9 g/dL — ABNORMAL LOW (ref 12.0–15.0)
MCH: 21.9 pg — ABNORMAL LOW (ref 26.0–34.0)
MCHC: 29 g/dL — ABNORMAL LOW (ref 30.0–36.0)
MCV: 75.6 fL — ABNORMAL LOW (ref 80.0–100.0)
Platelets: 205 10*3/uL (ref 150–400)
RBC: 4.06 MIL/uL (ref 3.87–5.11)
RDW: 17.4 % — ABNORMAL HIGH (ref 11.5–15.5)
WBC: 3.8 10*3/uL — ABNORMAL LOW (ref 4.0–10.5)
nRBC: 0 % (ref 0.0–0.2)

## 2019-11-29 LAB — MAGNESIUM: Magnesium: 1.7 mg/dL (ref 1.7–2.4)

## 2019-11-29 LAB — GLUCOSE, CAPILLARY: Glucose-Capillary: 92 mg/dL (ref 70–99)

## 2019-11-29 NOTE — Discharge Summary (Addendum)
Physician Discharge Summary  Shaaron AdlerLisa Ann Pricer [id number] DOB: Feb 15, 1966 DOA: 11/27/2019  PCP: Knox RoyaltyJones, Enrico, MD  Admit date: 11/27/2019 Discharge date: 11/29/2019  Admitted From: Home Disposition: Home  Recommendations for Outpatient Follow-up:  1. Follow up with PCP in 1-2 weeks 2. Please obtain BMP/CBC in one week your next doctors visit.  3. Follow-up outpatient psychiatry/psychology in 1-2 weeks   Discharge Condition: Stable CODE STATUS: Full Diet recommendation: Diabetic  Brief/Interim Summary: 54 y.o.femalewithhistory of diabetes mellitus type 2, hypertension, hyperlipidemia, bipolar disorder was admitted 2 weeks ago for DKA when patient had missed her doses of insulin presents to the ER at [facility] with complaint of right-sided weakness. CT head followed by CT angiogram of the head and neck was done which did not show any large vessel obstruction. Tele neurology was consulted and felt that patient was outside the window for TPA and patient's neurological exam also was inconsistent.  MRI brain negative.  Neurology consulted.  Thought her symptoms were secondary to conversion disorder.  Seen by PT, she is back to baseline therefore stable for discharge with outpatient follow-up with psychiatry/psychology.   Assessment & Plan:   Principal Problem:   Right sided weakness Active Problems:   Type 2 diabetes mellitus (HCC)   Essential hypertension   OSA (obstructive sleep apnea)   Right-sided weakness, unknown exact etiology, suspect conversion disorder, resolved -CT of the head is unremarkable, MRI brain is also negative -Seen by neurology.  Suspect conversion disorder. -LDL 58, A1c 11.9 -Recommend outpatient follow-up with psychiatry/psychology  Diabetes mellitus type 2, insulin-dependent -Poorly controlled, A1c 11.9 -Resume her home regimen.  I have advised her to remain compliant with her regimen and follow-up outpatient primary care physician and  get a referral for endocrinology if necessary  Essential hypertension -Norvasc 5 mg daily, propranolol 20 mg twice daily  Hyperlipidemia -Lipitor 40 mg daily  Bipolar disorder -Continue Elavil, Abilify of, Klonopin, Risperdal and trazodone.  Outpatient psychiatry follow-up  Anemia of chronic disease, microcytic -Continue to monitor hemoglobin.  Follow-up outpatient.   There is no height or weight on file to calculate BMI.     Discharge Diagnoses:  Principal Problem:   Right sided weakness Active Problems:   Type 2 diabetes mellitus (HCC)   Essential hypertension   OSA (obstructive sleep apnea)      Consultations:  Neurology  Subjective: Feels back to baseline denies any suicidal or homicidal ideation.  Wishes to go home.  Discharge Exam: Vitals:   11/29/19 0317 11/29/19 0922  BP: 118/76 125/82  Pulse: 90 90  Resp: 18 20  Temp: 97.9 F (36.6 C) 98.1 F (36.7 C)  SpO2: 98% 99%   Vitals:   11/28/19 1935 11/28/19 2334 11/29/19 0317 11/29/19 0922  BP: 124/70 122/81 118/76 125/82  Pulse: 99 (!) 52 90 90  Resp: 19 19 18 20   Temp: 99.1 F (37.3 C) 98.8 F (37.1 C) 97.9 F (36.6 C) 98.1 F (36.7 C)  TempSrc: Oral Oral Oral Oral  SpO2: 97% 96% 98% 99%    General: Pt is alert, awake, not in acute distress Cardiovascular: RRR, S1/S2 +, no rubs, no gallops Respiratory: CTA bilaterally, no wheezing, no rhonchi Abdominal: Soft, NT, ND, bowel sounds + Extremities: no edema, no cyanosis Denies any suicidal or homicidal ideation  Discharge Instructions  Discharge Instructions    Ambulatory referral to Psychiatry   Complete by: As directed      Allergies as of 11/29/2019   No Known Allergies  Medication List    STOP taking these medications   Ensure Max Protein Liqd   sulfamethoxazole-trimethoprim 400-80 MG tablet Commonly known as: BACTRIM     TAKE these medications   albuterol 108 (90 Base) MCG/ACT inhaler Commonly known as: VENTOLIN  HFA Inhale 1-2 puffs into the lungs every 6 (six) hours as needed for wheezing or shortness of breath.   amitriptyline 75 MG tablet Commonly known as: ELAVIL Take 75 mg by mouth at bedtime.   amLODipine 5 MG tablet Commonly known as: NORVASC Take 5 mg by mouth daily.   ARIPiprazole 2 MG tablet Commonly known as: ABILIFY Take 2 mg by mouth daily.   atorvastatin 40 MG tablet Commonly known as: LIPITOR Take 40 mg by mouth daily.   clonazePAM 0.5 MG tablet Commonly known as: KLONOPIN Take 0.5 mg by mouth daily.   diclofenac 75 MG EC tablet Commonly known as: VOLTAREN Take 75 mg by mouth 2 (two) times daily.   Janumet 50-1000 MG tablet Generic drug: sitaGLIPtin-metformin Take 1 tablet by mouth 2 (two) times daily.   lamoTRIgine 200 MG tablet Commonly known as: LAMICTAL Take 200 mg by mouth daily.   Lantus SoloStar 100 UNIT/ML Solostar Pen Generic drug: insulin glargine Inject 60 Units into the skin 2 (two) times daily.   Latuda 80 MG Tabs tablet Generic drug: lurasidone Take 80 mg by mouth at bedtime.   losartan 25 MG tablet Commonly known as: COZAAR Take 25 mg by mouth daily.   ondansetron 8 MG tablet Commonly known as: ZOFRAN Take 1 tablet (8 mg total) by mouth every 8 (eight) hours as needed for nausea or vomiting.   Oxycodone HCl 10 MG Tabs Take 10 mg by mouth every 4 (four) hours as needed (pain). Taking once daily as needed   pantoprazole 40 MG tablet Commonly known as: PROTONIX TAKE 1 TABLET (40 MG TOTAL) BY MOUTH 2 (TWO) TIMES DAILY FOR 28 DAYS.   promethazine 6.25 MG/5ML syrup Commonly known as: PHENERGAN Take 5 mLs by mouth 2 (two) times daily as needed for nausea/vomiting.   Promethazine-Codeine 6.25-10 MG/5ML Soln Take 5 mLs by mouth every 4 (four) hours as needed for cough.   propranolol 20 MG tablet Commonly known as: INDERAL Take 20 mg by mouth 2 (two) times daily.   risperiDONE 0.5 MG tablet Commonly known as: RISPERDAL Take 0.5 mg by  mouth at bedtime.   traZODone 100 MG tablet Commonly known as: DESYREL Take 100 mg by mouth at bedtime.   triamcinolone cream 0.1 % Commonly known as: KENALOG Apply 1 application topically 3 (three) times daily.            Durable Medical Equipment  (From admission, onward)         Start     Ordered   11/28/19 1635  For home use only DME Walker rolling  Once       Question Answer Comment  Walker: With 5 Inch Wheels   Patient needs a walker to treat with the following condition Right sided weakness      11/28/19 1635          Follow-up Information    [facility], [facility] Follow up.   Specialty: Home Health Services Why: The home health agency will contact you for first home visit. Contact information: [address] [place] [address] [phone]        Knox Royalty, MD. Schedule an appointment as soon as possible for a visit in 1 week(s).  Specialty: Family Medicine Contact information: [address] [place] [address] [phone]              No Known Allergies  You were cared for by a hospitalist during your hospital stay. If you have any questions about your discharge medications or the care you received while you were in the hospital after you are discharged, you can call the unit and asked to speak with the hospitalist on call if the hospitalist that took care of you is not available. Once you are discharged, your primary care physician will handle any further medical issues. Please note that no refills for any discharge medications will be authorized once you are discharged, as it is imperative that you return to your primary care physician (or establish a relationship with a primary care physician if you do not have one) for your aftercare needs so that they can reassess your need for medications and monitor your lab values.   Procedures/Studies: DG Chest 2 View  Result Date: 11/12/2019 CLINICAL DATA:  Shortness of breath  for 3 hours. EXAM: CHEST - 2 VIEW COMPARISON:  11/03/2019 FINDINGS: Improved lung volumes from prior exam. The cardiomediastinal contours are normal. Resolved left lung base atelectasis from prior. Pulmonary vasculature is normal. No consolidation, pleural effusion, or pneumothorax. No acute osseous abnormalities are seen. IMPRESSION: No acute chest findings. Improved lung volumes with resolved left lung base atelectasis from recent prior. Electronically Signed   By: Narda Rutherford M.D.   On: 11/12/2019 19:24   MR BRAIN WO CONTRAST  Result Date: 11/28/2019 CLINICAL DATA:  Right-sided numbness EXAM: MRI HEAD WITHOUT CONTRAST TECHNIQUE: Multiplanar, multiecho pulse sequences of the brain and surrounding structures were obtained without intravenous contrast. COMPARISON:  Head CT and CTA from yesterday FINDINGS: Brain: No acute infarction, hemorrhage, hydrocephalus, extra-axial collection or mass lesion. No atrophy or white matter disease. Vascular: Normal flow voids Skull and upper cervical spine: Normal marrow signal Sinuses/Orbits: Negative Other: Mild motion degradation. IMPRESSION: Negative brain MRI. Electronically Signed   By: Marnee Spring M.D.   On: 11/28/2019 08:09   DG Chest Port 1 View  Result Date: 11/03/2019 CLINICAL DATA:  Respiratory difficulty EXAM: PORTABLE CHEST 1 VIEW COMPARISON:  03/23/2019 FINDINGS: Heart is normal size. Left base atelectasis. Right lung clear. No effusions. No acute bony abnormality. IMPRESSION: Left base atelectasis. Electronically Signed   By: Charlett Nose M.D.   On: 11/03/2019 23:48   CT HEAD CODE STROKE WO CONTRAST  Result Date: 11/27/2019 CLINICAL DATA:  Code stroke.  Right-sided numbness EXAM: CT HEAD WITHOUT CONTRAST TECHNIQUE: Contiguous axial images were obtained from the base of the skull through the vertex without intravenous contrast. COMPARISON:  None. FINDINGS: Brain: There is no mass, hemorrhage or extra-axial collection. The size and configuration of  the ventricles and extra-axial CSF spaces are normal. The brain parenchyma is normal, without evidence of acute or chronic infarction. Vascular: No abnormal hyperdensity of the major intracranial arteries or dural venous sinuses. No intracranial atherosclerosis. Skull: The visualized skull base, calvarium and extracranial soft tissues are normal. Sinuses/Orbits: No fluid levels or advanced mucosal thickening of the visualized paranasal sinuses. No mastoid or middle ear effusion. The orbits are normal. ASPECTS [facility] Stroke Program Early CT Score) - Ganglionic level infarction (caudate, lentiform nuclei, internal capsule, insula, M1-M3 cortex): 7 - Supraganglionic infarction (M4-M6 cortex): 3 Total score (0-10 with 10 being normal): 10 IMPRESSION: 1. Normal head CT. 2. ASPECTS is 10. 3. These results were called by telephone at  the time of interpretation on 11/27/2019 at 8:19 pm to provider [facility] , who verbally acknowledged these results. Electronically Signed   By: Deatra Robinson M.D.   On: 11/27/2019 20:19   CT ANGIO HEAD CODE STROKE  Result Date: 11/27/2019 CLINICAL DATA:  Right-sided numbness EXAM: CT ANGIOGRAPHY HEAD AND NECK TECHNIQUE: Multidetector CT imaging of the head and neck was performed using the standard protocol during bolus administration of intravenous contrast. Multiplanar CT image reconstructions and MIPs were obtained to evaluate the vascular anatomy. Carotid stenosis measurements (when applicable) are obtained utilizing NASCET criteria, using the distal internal carotid diameter as the denominator. CONTRAST:  OMNIPAQUE IOHEXOL 350 MG/ML SOLN COMPARISON:  09/29/2019 CTA head neck FINDINGS: CTA NECK FINDINGS SKELETON: There is no bony spinal canal stenosis. No lytic or blastic lesion. OTHER NECK: Normal pharynx, larynx and major salivary glands. No cervical lymphadenopathy. Unremarkable thyroid gland. UPPER CHEST: No pneumothorax or pleural effusion. No nodules or masses.  AORTIC ARCH: There is no calcific atherosclerosis of the aortic arch. There is no aneurysm, dissection or hemodynamically significant stenosis of the visualized portion of the aorta. Conventional 3 vessel aortic branching pattern. The visualized proximal subclavian arteries are widely patent. RIGHT CAROTID SYSTEM: No dissection, occlusion or aneurysm. Mild atherosclerotic calcification at the carotid bifurcation without hemodynamically significant stenosis. LEFT CAROTID SYSTEM: Normal without aneurysm, dissection or stenosis. VERTEBRAL ARTERIES: Left dominant configuration. Both origins are clearly patent. There is no dissection, occlusion or flow-limiting stenosis to the skull base (V1-V3 segments). CTA HEAD FINDINGS POSTERIOR CIRCULATION: --Vertebral arteries: Normal V4 segments. --Inferior cerebellar arteries: Normal. --Basilar artery: Normal. --Superior cerebellar arteries: Normal. --Posterior cerebral arteries (PCA): Normal. ANTERIOR CIRCULATION: --Intracranial internal carotid arteries: Normal. --Anterior cerebral arteries (ACA): Normal. Both A1 segments are present. Patent anterior communicating artery (a-comm). --Middle cerebral arteries (MCA): Normal. VENOUS SINUSES: As permitted by contrast timing, patent. ANATOMIC VARIANTS: None Review of the MIP images confirms the above findings. IMPRESSION: 1. No emergent large vessel occlusion or high-grade stenosis of the intracranial or cervical arteries. 2. Mild right carotid bifurcation atherosclerotic calcification without hemodynamically significant stenosis by NASCET criteria. Electronically Signed   By: Deatra Robinson M.D.   On: 11/27/2019 21:18   CT ANGIO NECK CODE STROKE  Result Date: 11/27/2019 CLINICAL DATA:  Right-sided numbness EXAM: CT ANGIOGRAPHY HEAD AND NECK TECHNIQUE: Multidetector CT imaging of the head and neck was performed using the standard protocol during bolus administration of intravenous contrast. Multiplanar CT image reconstructions and  MIPs were obtained to evaluate the vascular anatomy. Carotid stenosis measurements (when applicable) are obtained utilizing NASCET criteria, using the distal internal carotid diameter as the denominator. CONTRAST:  OMNIPAQUE IOHEXOL 350 MG/ML SOLN COMPARISON:  09/29/2019 CTA head neck FINDINGS: CTA NECK FINDINGS SKELETON: There is no bony spinal canal stenosis. No lytic or blastic lesion. OTHER NECK: Normal pharynx, larynx and major salivary glands. No cervical lymphadenopathy. Unremarkable thyroid gland. UPPER CHEST: No pneumothorax or pleural effusion. No nodules or masses. AORTIC ARCH: There is no calcific atherosclerosis of the aortic arch. There is no aneurysm, dissection or hemodynamically significant stenosis of the visualized portion of the aorta. Conventional 3 vessel aortic branching pattern. The visualized proximal subclavian arteries are widely patent. RIGHT CAROTID SYSTEM: No dissection, occlusion or aneurysm. Mild atherosclerotic calcification at the carotid bifurcation without hemodynamically significant stenosis. LEFT CAROTID SYSTEM: Normal without aneurysm, dissection or stenosis. VERTEBRAL ARTERIES: Left dominant configuration. Both origins are clearly patent. There is no dissection, occlusion or flow-limiting stenosis to the skull base (  V1-V3 segments). CTA HEAD FINDINGS POSTERIOR CIRCULATION: --Vertebral arteries: Normal V4 segments. --Inferior cerebellar arteries: Normal. --Basilar artery: Normal. --Superior cerebellar arteries: Normal. --Posterior cerebral arteries (PCA): Normal. ANTERIOR CIRCULATION: --Intracranial internal carotid arteries: Normal. --Anterior cerebral arteries (ACA): Normal. Both A1 segments are present. Patent anterior communicating artery (a-comm). --Middle cerebral arteries (MCA): Normal. VENOUS SINUSES: As permitted by contrast timing, patent. ANATOMIC VARIANTS: None Review of the MIP images confirms the above findings. IMPRESSION: 1. No emergent large vessel  occlusion or high-grade stenosis of the intracranial or cervical arteries. 2. Mild right carotid bifurcation atherosclerotic calcification without hemodynamically significant stenosis by NASCET criteria. Electronically Signed   By: Deatra Robinson M.D.   On: 11/27/2019 21:18     The results of significant diagnostics from this hospitalization (including imaging, microbiology, ancillary and laboratory) are listed below for reference.     Microbiology: Recent Results (from the past 240 hour(s))  SARS Coronavirus 2 by RT PCR (hospital order, performed in [facility] hospital lab) Nasopharyngeal Nasopharyngeal Swab     Status: None   Collection Time: 11/27/19  8:08 PM   Specimen: Nasopharyngeal Swab  Result Value Ref Range Status   SARS Coronavirus 2 NEGATIVE NEGATIVE Final    Comment: (NOTE) SARS-CoV-2 target nucleic acids are NOT DETECTED.  The SARS-CoV-2 RNA is generally detectable in upper and lower respiratory specimens during the acute phase of infection. The lowest concentration of SARS-CoV-2 viral copies this assay can detect is 250 copies / mL. A negative result does not preclude SARS-CoV-2 infection and should not be used as the sole basis for treatment or other patient management decisions.  A negative result may occur with improper specimen collection / handling, submission of specimen other than nasopharyngeal swab, presence of viral mutation(s) within the areas targeted by this assay, and inadequate number of viral copies (<250 copies / mL). A negative result must be combined with clinical observations, patient history, and epidemiological information.  Fact Sheet for Patients:   BoilerBrush.com.cy  Fact Sheet for Healthcare Providers: https://pope.com/  This test is not yet approved or  cleared by the [place] FDA and has been authorized for detection and/or diagnosis of SARS-CoV-2 by FDA under an Emergency Use  Authorization (EUA).  This EUA will remain in effect (meaning this test can be used) for the duration of the COVID-19 declaration under Section 564(b)(1) of the Act, 21 U.S.C. section 360bbb-3(b)(1), unless the authorization is terminated or revoked sooner.  Performed at [facility], [address] [place], [place] [address]      Labs: BNP (last 3 results) Recent Labs    11/03/19 2358  BNP 26.3   Basic Metabolic Panel: Recent Labs  Lab 11/27/19 2007 11/28/19 0813  NA 136  --   K 4.1  --   CL 98  --   CO2 26  --   GLUCOSE 217*  --   BUN 19  --   CREATININE 1.05* 0.87  CALCIUM 8.8*  --    Liver Function Tests: Recent Labs  Lab 11/27/19 2007  AST 22  ALT 16  ALKPHOS 50  BILITOT 0.3  PROT 6.9  ALBUMIN 3.6   No results for input(s): LIPASE, AMYLASE in the last 168 hours. No results for input(s): AMMONIA in the last 168 hours. CBC: Recent Labs  Lab 11/27/19 2007 11/28/19 0813 11/29/19 0929  WBC 6.4 5.2 3.8*  NEUTROABS 3.2  --   --   HGB 9.5* 9.8* 8.9*  HCT 32.7* 33.8* 30.7*  MCV 76.2* 75.3* 75.6*  PLT 206 211 205   Cardiac Enzymes: No results for input(s): CKTOTAL, CKMB, CKMBINDEX, TROPONINI in the last 168 hours. BNP: Invalid input(s): POCBNP CBG: Recent Labs  Lab 11/28/19 0548 11/28/19 1240 11/28/19 1633 11/28/19 2113 11/29/19 0605  GLUCAP 82 94 152* 185* 92   D-Dimer No results for input(s): DDIMER in the last 72 hours. Hgb A1c Recent Labs    11/28/19 0813  HGBA1C 11.9*   Lipid Profile Recent Labs    11/28/19 0813  CHOL 144  HDL 64  LDLCALC 58  TRIG 112  CHOLHDL 2.3   Thyroid function studies No results for input(s): TSH, T4TOTAL, T3FREE, THYROIDAB in the last 72 hours.  Invalid input(s): FREET3 Anemia work up No results for input(s): VITAMINB12, FOLATE, FERRITIN, TIBC, IRON, RETICCTPCT in the last 72 hours. Urinalysis    Component Value Date/Time   COLORURINE YELLOW 11/28/2019 0021   APPEARANCEUR CLEAR  11/28/2019 0021   LABSPEC 1.010 11/28/2019 0021   PHURINE 6.0 11/28/2019 0021   GLUCOSEU NEGATIVE 11/28/2019 0021   HGBUR NEGATIVE 11/28/2019 0021   BILIRUBINUR NEGATIVE 11/28/2019 0021   KETONESUR NEGATIVE 11/28/2019 0021   PROTEINUR NEGATIVE 11/28/2019 0021   UROBILINOGEN 0.2 02/12/2015 1348   NITRITE NEGATIVE 11/28/2019 0021   LEUKOCYTESUR NEGATIVE 11/28/2019 0021   Sepsis Labs Invalid input(s): PROCALCITONIN,  WBC,  LACTICIDVEN Microbiology Recent Results (from the past 240 hour(s))  SARS Coronavirus 2 by RT PCR (hospital order, performed in [facility] hospital lab) Nasopharyngeal Nasopharyngeal Swab     Status: None   Collection Time: 11/27/19  8:08 PM   Specimen: Nasopharyngeal Swab  Result Value Ref Range Status   SARS Coronavirus 2 NEGATIVE NEGATIVE Final    Comment: (NOTE) SARS-CoV-2 target nucleic acids are NOT DETECTED.  The SARS-CoV-2 RNA is generally detectable in upper and lower respiratory specimens during the acute phase of infection. The lowest concentration of SARS-CoV-2 viral copies this assay can detect is 250 copies / mL. A negative result does not preclude SARS-CoV-2 infection and should not be used as the sole basis for treatment or other patient management decisions.  A negative result may occur with improper specimen collection / handling, submission of specimen other than nasopharyngeal swab, presence of viral mutation(s) within the areas targeted by this assay, and inadequate number of viral copies (<250 copies / mL). A negative result must be combined with clinical observations, patient history, and epidemiological information.  Fact Sheet for Patients:   BoilerBrush.com.cy  Fact Sheet for Healthcare Providers: https://pope.com/  This test is not yet approved or  cleared by the [place] FDA and has been authorized for detection and/or diagnosis of SARS-CoV-2 by FDA under an Emergency Use  Authorization (EUA).  This EUA will remain in effect (meaning this test can be used) for the duration of the COVID-19 declaration under Section 564(b)(1) of the Act, 21 U.S.C. section 360bbb-3(b)(1), unless the authorization is terminated or revoked sooner.  Performed at [facility], [address] [place], [place] [address]      Time coordinating discharge:  I have spent 35 minutes face to face with the patient and on the ward discussing the patients care, assessment, plan and disposition with other care givers. >50% of the time was devoted counseling the patient about the risks and benefits of treatment/Discharge disposition and coordinating care.   SIGNED:   Dimple Nanas, MD  [facility] 11/29/2019, 11:00 AM   If 7PM-7AM, please contact night-coverage

## 2019-11-29 NOTE — Progress Notes (Signed)
PIV removed without difficulties. All personal belongings collected from the patient's room by the patient. Discharge instructions reviewed with patient, patient verbalized understanding. Patient discharged from the unit via wheelchair to personal vehicle driven by son.

## 2019-11-29 NOTE — Progress Notes (Addendum)
Occupational Therapy Treatment Patient Details Name: Monique Holland MRN: [id number] DOB: 30-Dec-1965 Today's Date: 11/29/2019    History of present illness 54 y.o. female with history of diabetes mellitus type 2, hypertension, hyperlipidemia, bipolar disorder, schitzophrenia was admitted 2 weeks ago for DKA when patient had missed her doses of insulin presents to the ER at [facility] with complaint of right-sided weakness. CT and MRI of head negative for acute changes. Recent admission 09/29/19 also for R sided weakness which resolved.   OT comments  Familiar with pt from previous admissions. Pt functioning close to baseline level. Educated pt on strategies to reduce risk of falls and need for S for medication management (son supervises medication management). No further OT needed.   Follow Up Recommendations  No OT follow up;Supervision - Intermittent (supervision wtih medication managemetn)    Equipment Recommendations  None recommended by OT    Recommendations for Other Services      Precautions / Restrictions Precautions Precautions: Fall Restrictions Weight Bearing Restrictions: No       Mobility Bed Mobility Overal bed mobility: Modified Independent                Transfers Overall transfer level: Modified independent                    Balance Overall balance assessment: Mild deficits observed, not formally tested                                         ADL either performed or assessed with clinical judgement   ADL                                       Functional mobility during ADLs: Modified independent General ADL Comments: Ambulated tobathrom,copmleted pericare, Grooming at sinnk and LB dressing @ Modified independent level. Able to retireve items from floor without LOB.  Able to independently verbalize safety plan in case of fire.     Vision       Perception     Praxis      Cognition  Arousal/Alertness: Awake/alert Behavior During Therapy: Flat affect Overall Cognitive Status: History of cognitive impairments - at baseline                                          Exercises     Shoulder Instructions       General Comments Son assists with medication managemetn adn IADL tasks    Pertinent Vitals/ Pain       Pain Assessment: Faces Faces Pain Scale: Hurts a little bit Pain Location: knee pain Pain Descriptors / Indicators: Discomfort Pain Intervention(s): Limited activity within patient's tolerance  Home Living                                          Prior Functioning/Environment              Frequency           Progress Toward Goals  OT Goals(current goals can now be found in the care plan section)  Progress towards OT goals: Goals met/education completed, patient discharged from OT  Acute Rehab OT Goals Patient Stated Goal: return to living independently OT Goal Formulation: With patient Time For Goal Achievement: 12/12/19 Potential to Achieve Goals: Good  Plan Discharge plan needs to be updated    Co-evaluation                 AM-PAC OT "6 Clicks" Daily Activity     Outcome Measure   Help from another person eating meals?: None Help from another person taking care of personal grooming?: None Help from another person toileting, which includes using toliet, bedpan, or urinal?: None Help from another person bathing (including washing, rinsing, drying)?: None Help from another person to put on and taking off regular upper body clothing?: None Help from another person to put on and taking off regular lower body clothing?: None 6 Click Score: 24    End of Session    OT Visit Diagnosis: Unsteadiness on feet (R26.81);Pain Pain - Right/Left: Left Pain - part of body: Knee   Activity Tolerance Patient tolerated treatment well   Patient Left in chair;with call bell/phone within reach;with chair  alarm set   Nurse Communication Mobility status;Other (comment) (DC needs)        Time: 9326-7124 OT Time Calculation (min): 14 min  Charges: OT General Charges $OT Visit: 1 Visit OT Treatments $Self Care/Home Management : 8-22 mins  Maurie Boettcher, OT/L   Acute OT Clinical Specialist Acute Rehabilitation Services Pager [phone] Office [phone]    [facility] 11/29/2019, 11:46 AM

## 2019-11-29 NOTE — TOC Transition Note (Signed)
Transition of Care [facility]) - CM/SW Discharge Note   Patient Details  Name: Monique Holland MRN: [id number] Date of Birth: 1965/07/21  Transition of [facility] CM/SW Contact:  Kermit Balo, RN Phone Number: 11/29/2019, 9:31 AM   Clinical Narrative:    Pt is discharging home with [facility] services through [place]. Cindy with [facility] accepted the referral.  Pt has refused walker for home.  Pt has intermittent supervision at home and transportation to home.    Final next level of care: Home w Home Health Services Barriers to Discharge: No Barriers Identified   Patient Goals and CMS Choice   CMS Medicare.gov Compare Post Acute Care list provided to:: Patient Choice offered to / list presented to : Patient  Discharge Placement                       Discharge Plan and Services                          HH Arranged: PT, OT [facility] Agency: [facility] Date [facility] Agency Contacted: 11/28/19   Representative spoke with at [facility] Agency: Arline Asp  Social Determinants of Health (SDOH) Interventions     Readmission Risk Interventions Readmission Risk Prevention Plan 10/01/2019 08/20/2019 09/04/2018  Transportation Screening Complete Complete Complete  PCP or Specialist Appt within 5-7 Days - - Complete  Home Care Screening - - Complete  Medication Review (RN CM) - - Complete  Medication Review [employer]) Complete Complete -  PCP or Specialist appointment within 3-5 days of discharge Complete Complete -  HRI or Home Care Consult Not Complete - -  HRI or Home Care Consult Pt Refusal Comments Unable to secure [facility] services; referral to OP rehab - -  SW Recovery Care/Counseling Consult Patient refused Complete -  Palliative Care Screening Not Applicable Not Applicable -  Skilled Nursing Facility Not Applicable Not Applicable -  Some recent data might be hidden

## 2019-11-29 NOTE — Plan of Care (Signed)

## 2019-11-29 NOTE — Consult Note (Signed)
  Psychiatry consult initially ordered, this provider arrived to patient room to find patient has been ordered for discharge and psychiatry consult has been completed. Patient states "I heard you were coming and I am glad you came by." Patient alert and oriented, answers appropriately.  Patient stated "I feel fine."  Patient denies all crisis criteria including suicidality.  Patient reports she resides in [place] with her 53 year old son.  Patient denies access to weapons.  Patient reports she is recently separated.  Patient currently receives disability benefits. Patient reports she is followed by outpatient psychiatry for diagnosis of schizophrenia at [facility].  Patient reports compliance with medications. Patient denies psychiatric needs at this time. Patient discussed with Dr. Lucianne Muss.

## 2019-11-29 NOTE — Care Management Obs Status (Signed)
MEDICARE OBSERVATION STATUS NOTIFICATION   Patient Details  Name: Alianis Trimmer MRN: [id number] Date of Birth: 1965-07-18   Medicare Observation Status Notification Given:  Yes    Kermit Balo, RN 11/29/2019, 9:33 AM

## 2019-12-09 ENCOUNTER — Other Ambulatory Visit: Payer: Self-pay | Admitting: Family Medicine

## 2019-12-15 ENCOUNTER — Emergency Department (HOSPITAL_BASED_OUTPATIENT_CLINIC_OR_DEPARTMENT_OTHER)
Admission: EM | Admit: 2019-12-15 | Discharge: 2019-12-15 | Disposition: A | Discharge status: Home / Self Care | Payer: Medicare Other | Attending: Emergency Medicine | Admitting: Emergency Medicine

## 2019-12-15 ENCOUNTER — Encounter (HOSPITAL_BASED_OUTPATIENT_CLINIC_OR_DEPARTMENT_OTHER): Payer: Self-pay | Admitting: Emergency Medicine

## 2019-12-15 ENCOUNTER — Other Ambulatory Visit: Payer: Self-pay

## 2019-12-15 DIAGNOSIS — R112 Nausea with vomiting, unspecified: Secondary | ICD-10-CM | POA: Insufficient documentation

## 2019-12-15 DIAGNOSIS — E669 Obesity, unspecified: Secondary | ICD-10-CM | POA: Insufficient documentation

## 2019-12-15 DIAGNOSIS — E119 Type 2 diabetes mellitus without complications: Secondary | ICD-10-CM | POA: Diagnosis not present

## 2019-12-15 DIAGNOSIS — R1013 Epigastric pain: Secondary | ICD-10-CM | POA: Diagnosis not present

## 2019-12-15 DIAGNOSIS — J45909 Unspecified asthma, uncomplicated: Secondary | ICD-10-CM | POA: Insufficient documentation

## 2019-12-15 DIAGNOSIS — R1011 Right upper quadrant pain: Secondary | ICD-10-CM | POA: Diagnosis not present

## 2019-12-15 DIAGNOSIS — I10 Essential (primary) hypertension: Secondary | ICD-10-CM | POA: Diagnosis not present

## 2019-12-15 DIAGNOSIS — Z79899 Other long term (current) drug therapy: Secondary | ICD-10-CM | POA: Diagnosis not present

## 2019-12-15 LAB — CBC
HCT: 33.8 % — ABNORMAL LOW (ref 36.0–46.0)
Hemoglobin: 9.9 g/dL — ABNORMAL LOW (ref 12.0–15.0)
MCH: 22.4 pg — ABNORMAL LOW (ref 26.0–34.0)
MCHC: 29.3 g/dL — ABNORMAL LOW (ref 30.0–36.0)
MCV: 76.5 fL — ABNORMAL LOW (ref 80.0–100.0)
Platelets: 229 10*3/uL (ref 150–400)
RBC: 4.42 MIL/uL (ref 3.87–5.11)
RDW: 16.6 % — ABNORMAL HIGH (ref 11.5–15.5)
WBC: 5.7 10*3/uL (ref 4.0–10.5)
nRBC: 0 % (ref 0.0–0.2)

## 2019-12-15 LAB — COMPREHENSIVE METABOLIC PANEL
ALT: 14 U/L (ref 0–44)
AST: 15 U/L (ref 15–41)
Albumin: 3.7 g/dL (ref 3.5–5.0)
Alkaline Phosphatase: 57 U/L (ref 38–126)
Anion gap: 12 (ref 5–15)
BUN: 22 mg/dL — ABNORMAL HIGH (ref 6–20)
CO2: 25 mmol/L (ref 22–32)
Calcium: 9.1 mg/dL (ref 8.9–10.3)
Chloride: 102 mmol/L (ref 98–111)
Creatinine, Ser: 0.83 mg/dL (ref 0.44–1.00)
GFR calc Af Amer: >60 mL/min (ref >60)
GFR calc non Af Amer: >60 mL/min (ref >60)
Glucose, Bld: 92 mg/dL (ref 70–99)
Potassium: 3.9 mmol/L (ref 3.5–5.1)
Sodium: 139 mmol/L (ref 135–145)
Total Bilirubin: 0.1 mg/dL — ABNORMAL LOW (ref 0.3–1.2)
Total Protein: 7.1 g/dL (ref 6.5–8.1)

## 2019-12-15 LAB — LIPASE, BLOOD: Lipase: 22 U/L (ref 11–51)

## 2019-12-15 MED ORDER — DIPHENHYDRAMINE HCL 50 MG/ML IJ SOLN
25.0000 mg | Freq: Once | INTRAMUSCULAR | Status: AC
  Administered 2019-12-15: 25 mg via INTRAVENOUS
  Filled 2019-12-15: qty 1

## 2019-12-15 MED ORDER — HYDROMORPHONE HCL 1 MG/ML IJ SOLN
0.5000 mg | Freq: Once | INTRAMUSCULAR | Status: AC
  Administered 2019-12-15: 0.5 mg via INTRAVENOUS
  Filled 2019-12-15: qty 1

## 2019-12-15 MED ORDER — SODIUM CHLORIDE 0.9 % IV BOLUS
1000.0000 mL | Freq: Once | INTRAVENOUS | Status: AC
  Administered 2019-12-15: 1000 mL via INTRAVENOUS

## 2019-12-15 MED ORDER — ONDANSETRON 4 MG PO TBDP
ORAL_TABLET | ORAL | 0 refills | Status: AC
Start: 2019-12-15 — End: ?

## 2019-12-15 MED ORDER — OXYCODONE HCL 5 MG PO TABS
5.0000 mg | ORAL_TABLET | Freq: Once | ORAL | Status: AC
  Administered 2019-12-15: 5 mg via ORAL
  Filled 2019-12-15: qty 1

## 2019-12-15 MED ORDER — ONDANSETRON 4 MG PO TBDP: 4.0000 mg | ORAL_TABLET | Freq: Once | ORAL | Status: DC

## 2019-12-15 MED ORDER — HYDROMORPHONE HCL 1 MG/ML IJ SOLN
1.0000 mg | Freq: Once | INTRAMUSCULAR | Status: AC
  Administered 2019-12-15: 1 mg via INTRAVENOUS
  Filled 2019-12-15: qty 1

## 2019-12-15 MED ORDER — PROCHLORPERAZINE EDISYLATE 10 MG/2ML IJ SOLN
10.0000 mg | Freq: Once | INTRAMUSCULAR | Status: AC
  Administered 2019-12-15: 10 mg via INTRAVENOUS
  Filled 2019-12-15: qty 2

## 2019-12-15 MED ORDER — ALUM & MAG HYDROXIDE-SIMETH 200-200-20 MG/5ML PO SUSP
30.0000 mL | Freq: Once | ORAL | Status: AC
  Administered 2019-12-15: 30 mL via ORAL
  Filled 2019-12-15: qty 30

## 2019-12-15 NOTE — Discharge Instructions (Signed)
Try pepcid or tagamet up to twice a day.  Try to avoid things that may make this worse, most commonly these are spicy foods tomato based products fatty foods chocolate and peppermint.  Alcohol and tobacco can also make this worse.  Return to the emergency department for sudden worsening pain fever or inability to eat or drink.  

## 2019-12-15 NOTE — ED Notes (Signed)
Pt discharged to home. Discharge instructions have been discussed with patient and/or family members. Pt verbally acknowledges understanding d/c instructions, and endorses comprehension to checkout at registration before leaving.  °

## 2019-12-15 NOTE — ED Triage Notes (Signed)
Patient states that she has chronic pancreatitis and she has had pain and vomiting since this am and the last 4 hours she has had blood in her vomit. Pt reports generalized abdominal pain with N/V

## 2019-12-15 NOTE — ED Provider Notes (Signed)
[facility] EMERGENCY DEPARTMENT Provider Note   CSN: [id number] Arrival date & time: 12/15/19  1548     History Chief Complaint  Patient presents with  . Abdominal Pain    Monique Holland is a 54 y.o. female.  54 yo F with a chief complaints of epigastric abdominal pain.  She states feels exactly like her chronic pancreatitis.  Going on for about 12 hours.  She denies fever denies different sensation of pain.  Has not been able to eat or drink.  Denies diarrhea.  Typically when this happens she feels that she needs to be admitted to the hospital for improvement.  The history is provided by the patient.  Abdominal Pain Pain location:  Epigastric Pain quality: sharp and shooting   Pain radiates to:  Does not radiate Pain severity:  Moderate Onset quality:  Gradual Duration:  12 hours Timing:  Constant Progression:  Unchanged Chronicity:  Recurrent Relieved by:  Nothing Worsened by:  Eating Ineffective treatments:  None tried Associated symptoms: nausea and vomiting   Associated symptoms: no chest pain, no chills, no diarrhea, no dysuria, no fever and no shortness of breath        Past Medical History:  Diagnosis Date  . Anxiety   . Asthma   . Bipolar disorder (HCC)   . Depression   . Diabetes mellitus without complication (HCC)   . Frequency of urination   . GERD (gastroesophageal reflux disease)   . History of asthma    last episode yrs ago  . Hypertension   . OSA (obstructive sleep apnea)    pt had study done oct 2014--  schedule for cpap titrate after kidney stone surgery  . Pancreatitis   . Pseudocyst of pancreas   . Schizophrenia (HCC)   . Ureteral calculi    BILATERAL  . Wears glasses     Patient Active Problem List   Diagnosis Date Noted  . Acute bronchitis 11/13/2019  . Hyperglycemia due to diabetes mellitus (HCC) 11/13/2019  . DKA (diabetic ketoacidoses) (HCC) 11/13/2019  . Prolonged QT interval 10/17/2019  . Pancreatitis 10/16/2019    . Hyperlipidemia 10/01/2019  . OSA (obstructive sleep apnea) 10/01/2019  . GERD (gastroesophageal reflux disease) 10/01/2019  . Right sided weakness 09/29/2019  . Stroke-like episode s/p tPA 09/29/2019  . History of pancreatitis   . Intractable abdominal pain 09/15/2019  . Hyperosmolar hyperglycemic state (HHS) (HCC) 09/15/2019  . Non-intractable vomiting   . Hematemesis with nausea   . Abdominal pain 08/03/2019  . Essential hypertension 08/03/2019  . Pancreatic pseudocyst   . Nausea and vomiting in adult   . Gastritis and gastroduodenitis   . Schizophrenia (HCC) 09/02/2018  . Obesity, Class III, BMI 40-49.9 (morbid obesity) (HCC) 09/02/2018  . Iron deficiency 09/02/2018  . Symptomatic anemia 09/01/2018  . Hypokalemia 09/01/2018  . AKI (acute kidney injury) (HCC) 08/11/2018  . Hypotension 08/11/2018  . Type 2 diabetes mellitus (HCC) 08/11/2018  . Ureteral calculus, right 05/02/2013    Past Surgical History:  Procedure Laterality Date  . BALLOON DILATION N/A 08/23/2019   Procedure: BALLOON DILATION;  Surgeon: Meridee Score Netty Starring., MD;  Location: [facility];  Service: Gastroenterology;  Laterality: N/A;  . BIOPSY  07/18/2019   Procedure: BIOPSY;  Surgeon: Shellia Cleverly, DO;  Location: [facility];  Service: Gastroenterology;;  . BIOPSY  08/23/2019   Procedure: BIOPSY;  Surgeon: Lemar Lofty., MD;  Location: [facility];  Service: Gastroenterology;;  . BIOPSY  09/18/2019   Procedure:  BIOPSY;  Surgeon: Lemar Lofty., MD;  Location: [facility];  Service: Gastroenterology;;  . BIOPSY  10/18/2019   Procedure: BIOPSY;  Surgeon: Lemar Lofty., MD;  Location: [facility];  Service: Gastroenterology;;  . CESAREAN SECTION  1991  &  2002   w/ bilateral tubal ligation in 2002  . CYSTOSCOPY W/ URETERAL STENT PLACEMENT Right 08/10/2018   Procedure: CYSTOSCOPY WITH RETROGRADE PYELOGRAM/URETERAL STENT PLACEMENT;  Surgeon: Jerilee Field, MD;  Location:  [facility];  Service: Urology;  Laterality: Right;  . CYSTOSCOPY WITH RETROGRADE PYELOGRAM, URETEROSCOPY AND STENT PLACEMENT Bilateral 05/02/2013   Procedure: CYSTOSCOPY WITH RETROGRADE PYELOGRAM, URETEROSCOPY ;  Surgeon: Valetta Fuller, MD;  Location: [facility];  Service: Urology;  Laterality: Bilateral;  . CYSTOSCOPY WITH RETROGRADE PYELOGRAM, URETEROSCOPY AND STENT PLACEMENT Right 08/15/2018   Procedure: CYSTOSCOPY WITH RIGHT RETROGRADE PYELOGRAM, RIGHT URETEROSCOPY WITH HOLMIUM LASER AND STENT PLACEMENT;  Surgeon: Crista Elliot, MD;  Location: [facility];  Service: Urology;  Laterality: Right;  . CYSTOSCOPY WITH STENT PLACEMENT Left 05/02/2013   Procedure: CYSTOSCOPY WITH STENT PLACEMENT;  Surgeon: Valetta Fuller, MD;  Location[facility];  Service: Urology;  Laterality: Left;  . ENDOROTOR N/A 08/26/2019   Procedure: [id number];  Surgeon: Mansouraty, Netty Starring., MD;  Location: [facility];  Service: Gastroenterology;  Laterality: N/A;  . ENDOROTOR  09/05/2019   Procedure: QIONGEXBM;  Surgeon: Mansouraty, Netty Starring., MD;  Location: [facility]COPY;  Service: Gastroenterology;;  . ESOPHAGOGASTRODUODENOSCOPY N/A 08/26/2019   Procedure: ESOPHAGOGASTRODUODENOSCOPY (EGD) with NECROSECTOMY;  Surgeon: Lemar Lofty., MD;  Location: [facility];  Service: Gastroenterology;  Laterality: N/A;  . ESOPHAGOGASTRODUODENOSCOPY N/A 10/18/2019   Procedure: ESOPHAGOGASTRODUODENOSCOPY (EGD);  Surgeon: Lemar Lofty., MD;  Location: [facility];  Service: Gastroenterology;  Laterality: N/A;  . ESOPHAGOGASTRODUODENOSCOPY (EGD) WITH PROPOFOL N/A 07/18/2019   Procedure: ESOPHAGOGASTRODUODENOSCOPY (EGD) WITH PROPOFOL;  Surgeon: Shellia Cleverly, DO;  Location: [facility];  Service: Gastroenterology;  Laterality: N/A;  . ESOPHAGOGASTRODUODENOSCOPY (EGD) WITH PROPOFOL N/A 08/18/2019   Procedure: ESOPHAGOGASTRODUODENOSCOPY (EGD) WITH PROPOFOL;  Surgeon: Hilarie Fredrickson, MD;   Location: [facility];  Service: Endoscopy;  Laterality: N/A;  . ESOPHAGOGASTRODUODENOSCOPY (EGD) WITH PROPOFOL N/A 08/21/2019   Procedure: ESOPHAGOGASTRODUODENOSCOPY (EGD) WITH PROPOFOL;  Surgeon: Meridee Score Netty Starring., MD;  Location: [facility];  Service: Gastroenterology;  Laterality: N/A;  . ESOPHAGOGASTRODUODENOSCOPY (EGD) WITH PROPOFOL N/A 08/23/2019   Procedure: ESOPHAGOGASTRODUODENOSCOPY (EGD) WITH PROPOFOL;  Surgeon: Meridee Score Netty Starring., MD;  Location: [facility];  Service: Gastroenterology;  Laterality: N/A;  . ESOPHAGOGASTRODUODENOSCOPY (EGD) WITH PROPOFOL N/A 09/05/2019   Procedure: ESOPHAGOGASTRODUODENOSCOPY (EGD) WITH PROPOFOL;  Surgeon: Meridee Score Netty Starring., MD;  Location: [facility] END[facility]COPY;  Service: Gastroenterology;  Laterality: N/A;   With pancreatic pseudocyst necrosectomy via established cyst gastrostomy  . ESOPHAGOGASTRODUODENOSCOPY (EGD) WITH PROPOFOL N/A 09/18/2019   Procedure: ESOPHAGOGASTRODUODENOSCOPY (EGD) WITH PROPOFOL;  Surgeon: Meridee Score Netty Starring., MD;  Location: [facility];  Service: Gastroenterology;  Laterality: N/A;  . EUS  08/23/2019   Procedure: UPPER ENDOSCOPIC ULTRASOUND (EUS) LINEAR;  Surgeon: Lemar Lofty., MD;  Location: [facility];  Service: Gastroenterology;;  . FOREIGN BODY REMOVAL  09/05/2019   Procedure: FOREIGN BODY REMOVAL;  Surgeon: Lemar Lofty., MD;  Location: [facility]COPY;  Service: Gastroenterology;;  . FOREIGN BODY REMOVAL  09/18/2019   Procedure: FOREIGN BODY REMOVAL;  Surgeon: Lemar Lofty., MD;  Location: [facility];  Service: Gastroenterology;;  . HOLMIUM LASER APPLICATION Bilateral 05/02/2013   Procedure: HOLMIUM LASER APPLICATION;  Surgeon: Valetta Fuller, MD;  Location: [facility];  Service: Urology;  Laterality: Bilateral;  . LAPAROSCOPIC APPENDECTOMY N/A 05/22/2014   Procedure: APPENDECTOMY LAPAROSCOPIC;  Surgeon: Glenna FellowsBenjamin Hoxworth, MD;  Location: [facility];  Service: General;   Laterality: N/A;  . PANCREATIC STENT PLACEMENT  08/23/2019   Procedure: PANCREATIC STENT PLACEMENT;  Surgeon: Lemar LoftyMansouraty, Gabriel Jr., MD;  [facility];  Service: Gastroenterology;;  . PANCREATIC STENT PLACEMENT  09/05/2019   Procedure: PANCREATIC STENT PLACEMENT;  Surgeon: Lemar LoftyMansouraty, Gabriel Jr., MD;  [facility];  Service: Gastroenterology;;  . PANCREATIC STENT PLACEMENT  09/18/2019   Procedure: PANCREATIC STENT PLACEMENT;  Surgeon: Lemar LoftyMansouraty, Gabriel Jr., MD;  Location: [facility];  Service: Gastroenterology;;  cyst gastrostomy double pigtail stent placement   . STENT REMOVAL  08/26/2019   Procedure: STENT REMOVAL;  Surgeon: Lemar LoftyMansouraty, Gabriel Jr., MD;  [facility];  Service: Gastroenterology;;  . Francine GravenSTENT REMOVAL  09/05/2019   Procedure: STENT REMOVAL;  Surgeon: Lemar LoftyMansouraty, Gabriel Jr., MD;  [facility];  Service: Gastroenterology;;  . Francine GravenSTENT REMOVAL  09/18/2019   Procedure: STENT REMOVAL;  Surgeon: Lemar LoftyMansouraty, Gabriel Jr., MD;  [facility];  Service: Gastroenterology;;  . TONSILLECTOMY  age 54  . UPPER ESOPHAGEAL ENDOSCOPIC ULTRASOUND (EUS) N/A 08/21/2019   Procedure: UPPER ESOPHAGEAL ENDOSCOPIC ULTRASOUND (EUS);  Surgeon: Lemar LoftyMansouraty, Gabriel Jr., MD;  [facility];  Service: Gastroenterology;  Laterality: N/A;  For cyst gastrostomy     OB History   No obstetric history on file.     Family History  Family history unknown: Yes    Social History   Tobacco Use  . Smoking status: Never Smoker  . Smokeless tobacco: Never Used  Vaping Use  . Vaping Use: Never used  Substance Use Topics  . Alcohol use: No  . Drug use: No    Home Medications Prior to Admission medications   Medication Sig Start Date End Date Taking? Authorizing Provider  albuterol (VENTOLIN HFA) 108 (90 Base) MCG/ACT inhaler Inhale 1-2 puffs into the lungs every 6 (six) hours as needed for wheezing or shortness of breath. 11/04/19   Rancour, Jeannett SeniorStephen, MD    amitriptyline (ELAVIL) 75 MG tablet Take 75 mg by mouth at bedtime. 08/10/19   [provider]  amLODipine (NORVASC) 5 MG tablet Take 5 mg by mouth daily.    [provider]  ARIPiprazole (ABILIFY) 2 MG tablet Take 2 mg by mouth daily. 10/22/19   [provider]  atorvastatin (LIPITOR) 40 MG tablet Take 40 mg by mouth daily.    [provider]  clonazePAM (KLONOPIN) 0.5 MG tablet Take 0.5 mg by mouth daily.  05/23/19   [provider]  diclofenac (VOLTAREN) 75 MG EC tablet Take 75 mg by mouth 2 (two) times daily. 10/30/19   [provider]  JANUMET 50-1000 MG tablet Take 1 tablet by mouth 2 (two) times daily. 09/24/19   [provider]  lamoTRIgine (LAMICTAL) 200 MG tablet Take 200 mg by mouth daily. 10/30/19   [provider]  LANTUS SOLOSTAR 100 UNIT/ML Solostar Pen Inject 60 Units into the skin 2 (two) times daily. 11/17/19   Swayze, Ava, DO  LATUDA 80 MG TABS tablet Take 80 mg by mouth at bedtime. 11/06/19   [provider]  losartan (COZAAR) 25 MG tablet Take 25 mg by mouth daily.    [provider]  ondansetron (ZOFRAN ODT) 4 MG disintegrating tablet 4mg  ODT q4 hours prn nausea/vomit 12/15/19   Melene PlanFloyd, Jahkari Maclin, DO  ondansetron (ZOFRAN) 8 MG tablet  Take 1 tablet (8 mg total) by mouth every 8 (eight) hours as needed for nausea or vomiting. 11/17/19   Swayze, Ava, DO  Oxycodone HCl 10 MG TABS Take 10 mg by mouth every 4 (four) hours as needed (pain). Taking once daily as needed 08/12/19   [provider]  pantoprazole (PROTONIX) 40 MG tablet TAKE 1 TABLET (40 MG TOTAL) BY MOUTH 2 (TWO) TIMES DAILY FOR 28 DAYS. 12/09/19 01/06/20  Anyanwu, Jethro Bastos, MD  promethazine (PHENERGAN) 6.25 MG/5ML syrup Take 5 mLs by mouth 2 (two) times daily as needed for nausea/vomiting. 11/08/19   [provider]  Promethazine-Codeine 6.25-10 MG/5ML SOLN Take 5 mLs by mouth every 4 (four) hours as needed for cough. 10/30/19   [provider]  propranolol (INDERAL) 20 MG tablet Take 20 mg by mouth 2 (two) times daily.    [provider]  risperiDONE (RISPERDAL) 0.5 MG tablet Take 0.5 mg by mouth at bedtime.  09/22/19   [provider]  traZODone (DESYREL) 100 MG tablet Take 100 mg by mouth at bedtime.    [provider]  triamcinolone cream (KENALOG) 0.1 % Apply 1 application topically 3 (three) times daily. 11/26/19   [provider]    Allergies    Patient has no known allergies.  Review of Systems   Review of Systems  Constitutional: Negative for chills and fever.  HENT: Negative for congestion and rhinorrhea.   Eyes: Negative for redness and visual disturbance.  Respiratory: Negative for shortness of breath and wheezing.   Cardiovascular: Negative for chest pain and palpitations.  Gastrointestinal: Positive for abdominal pain, nausea and vomiting. Negative for diarrhea.  Genitourinary: Negative for dysuria and urgency.  Musculoskeletal: Negative for arthralgias and myalgias.  Skin: Negative for pallor and wound.  Neurological: Negative for dizziness and headaches.    Physical Exam Updated Vital Signs BP 116/80   Pulse 89   Temp 98.4 F (36.9 C) (Oral)   Resp 12   Ht  (1.727 m)   Wt (!) 124.3 kg   LMP 03/23/2014 Comment: (-) u preg?/ac  SpO2 99%   BMI 41.66 kg/m   Physical Exam Vitals and nursing note reviewed.  Constitutional:      General: She is not in acute distress.    Appearance: She is well-developed. She is obese. She is not diaphoretic.  HENT:     Head: Normocephalic and atraumatic.  Eyes:     Pupils: Pupils are equal, round, and reactive to light.  Cardiovascular:     Rate and Rhythm: Normal rate and regular rhythm.     Heart sounds: No murmur heard.  No friction rub. No gallop.   Pulmonary:     Effort: Pulmonary effort is normal.     Breath sounds: No wheezing or rales.  Abdominal:     General: There is no distension.     Palpations:  Abdomen is soft.     Tenderness: There is abdominal tenderness (Worse to the epigastrium and right upper quadrant.).  Musculoskeletal:        General: No tenderness.     Cervical back: Normal range of motion and neck supple.  Skin:    General: Skin is warm and dry.  Neurological:     Mental Status: She is alert and oriented to person, place, and time.  Psychiatric:        Behavior: Behavior normal.     ED Results / Procedures / Treatments   Labs (all labs ordered  are listed, but only abnormal results are displayed) Labs Reviewed  CBC - Abnormal; Notable for the following components:      Result Value   Hemoglobin 9.9 (*)    HCT 33.8 (*)    MCV 76.5 (*)    MCH 22.4 (*)    MCHC 29.3 (*)    RDW 16.6 (*)    All other components within normal limits  COMPREHENSIVE METABOLIC PANEL - Abnormal; Notable for the following components:   BUN 22 (*)    Total Bilirubin 0.1 (*)    All other components within normal limits  LIPASE, BLOOD  POC OCCULT BLOOD, ED    EKG EKG Interpretation  Date/Time:  Sunday December 15 2019 18:01:11 EDT Ventricular Rate:  89 PR Interval:    QRS Duration: 92 QT Interval:  401 QTC Calculation: 488 R Axis:   3 Text Interpretation: Sinus rhythm qt normal when calculated manually Otherwise no significant change Confirmed by ,  ([id number]) on 12/15/2019 8:06:01 PM   Radiology No results found.  Procedures Procedures (including critical care time)  Medications Ordered in ED Medications  oxyCODONE (Oxy IR/ROXICODONE) immediate release tablet 5 mg (has no administration in time range)  HYDROmorphone (DILAUDID) injection 1 mg (1 mg Intravenous Given 12/15/19 1823)  prochlorperazine (COMPAZINE) injection 10 mg (10 mg Intravenous Given 12/15/19 1822)  diphenhydrAMINE (BENADRYL) injection 25 mg (25 mg Intravenous Given 12/15/19 1822)  sodium chloride 0.9 % bolus 1,000 mL ( Intravenous Stopped 12/15/19 1920)  HYDROmorphone (DILAUDID) injection 0.5 mg (0.5 mg  Intravenous Given 12/15/19 2126)  alum & mag hydroxide-simeth (MAALOX/MYLANTA) 200-200-20 MG/5ML suspension 30 mL (30 mLs Oral Given 12/15/19 2125)    ED Course  I have reviewed the triage vital signs and the nursing notes.  Pertinent labs & imaging results that were available during my care of the patient were reviewed by me and considered in my medical decision making (see chart for details).    MDM Rules/Calculators/A&P                          53  yo F with a chief complaints of epigastric abdominal pain.  She thinks it feels exactly like her prior pancreatitis.  We will give a bolus of IV fluids pain and nausea medicine check lab work and reassess.  Feeling better, tolerating PO.  9:51 PM:  I have discussed the diagnosis/risks/treatment options with the patient and believe the pt to be eligible for discharge home to follow-up with PCP. We also discussed returning to the ED immediately if new or worsening sx occur. We discussed the sx which are most concerning (e.g., sudden worsening pain, fever, inability to tolerate by mouth) that necessitate immediate return. Medications administered to the patient during their visit and any new prescriptions provided to the patient are listed below.  Medications given during this visit Medications  oxyCODONE (Oxy IR/ROXICODONE) immediate release tablet 5 mg (has no administration in time range)  HYDROmorphone (DILAUDID) injection 1 mg (1 mg Intravenous Given 12/15/19 1823)  prochlorperazine (COMPAZINE) injection 10 mg (10 mg Intravenous Given 12/15/19 1822)  diphenhydrAMINE (BENADRYL) injection 25 mg (25 mg Intravenous Given 12/15/19 1822)  sodium chloride 0.9 % bolus 1,000 mL ( Intravenous Stopped 12/15/19 1920)  HYDROmorphone (DILAUDID) injection 0.5 mg (0.5 mg Intravenous Given 12/15/19 2126)  alum & mag hydroxide-simeth (MAALOX/MYLANTA) 200-200-20 MG/5ML suspension 30 mL (30 mLs Oral Given 12/15/19 2125)     The patient appears reasonably screen  and/or stabilized for  discharge and I doubt any other medical condition or other [facility] requiring further screening, evaluation, or treatment in the ED at this time prior to discharge.   Final Clinical Impression(s) / ED Diagnoses Final diagnoses:  Epigastric abdominal pain    Rx / DC Orders ED Discharge Orders         Ordered    ondansetron (ZOFRAN ODT) 4 MG disintegrating tablet     Discontinue  Reprint     12/15/19 2142           Melene Plan, DO 12/15/19 2151

## 2019-12-19 ENCOUNTER — Encounter (HOSPITAL_BASED_OUTPATIENT_CLINIC_OR_DEPARTMENT_OTHER): Payer: Self-pay | Admitting: *Deleted

## 2019-12-19 ENCOUNTER — Other Ambulatory Visit: Payer: Self-pay

## 2019-12-19 DIAGNOSIS — J45909 Unspecified asthma, uncomplicated: Secondary | ICD-10-CM | POA: Diagnosis not present

## 2019-12-19 DIAGNOSIS — Z955 Presence of coronary angioplasty implant and graft: Secondary | ICD-10-CM | POA: Diagnosis not present

## 2019-12-19 DIAGNOSIS — K219 Gastro-esophageal reflux disease without esophagitis: Secondary | ICD-10-CM | POA: Diagnosis not present

## 2019-12-19 DIAGNOSIS — I10 Essential (primary) hypertension: Secondary | ICD-10-CM | POA: Insufficient documentation

## 2019-12-19 DIAGNOSIS — Z7951 Long term (current) use of inhaled steroids: Secondary | ICD-10-CM | POA: Diagnosis not present

## 2019-12-19 DIAGNOSIS — E119 Type 2 diabetes mellitus without complications: Secondary | ICD-10-CM | POA: Insufficient documentation

## 2019-12-19 DIAGNOSIS — G8929 Other chronic pain: Secondary | ICD-10-CM | POA: Insufficient documentation

## 2019-12-19 DIAGNOSIS — R109 Unspecified abdominal pain: Secondary | ICD-10-CM | POA: Insufficient documentation

## 2019-12-19 NOTE — ED Triage Notes (Addendum)
C/o cont abd pain with n/v. Seen here 4 days ago  for same. Appt with PMD for monday

## 2019-12-20 ENCOUNTER — Emergency Department (HOSPITAL_BASED_OUTPATIENT_CLINIC_OR_DEPARTMENT_OTHER)
Admission: EM | Admit: 2019-12-20 | Discharge: 2019-12-20 | Disposition: A | Discharge status: Home / Self Care | Payer: Medicare Other | Attending: Emergency Medicine | Admitting: Emergency Medicine

## 2019-12-20 ENCOUNTER — Telehealth: Payer: Self-pay | Admitting: Gastroenterology

## 2019-12-20 DIAGNOSIS — R109 Unspecified abdominal pain: Secondary | ICD-10-CM | POA: Diagnosis not present

## 2019-12-20 DIAGNOSIS — G8929 Other chronic pain: Secondary | ICD-10-CM

## 2019-12-20 LAB — CBC WITH DIFFERENTIAL/PLATELET
Abs Immature Granulocytes: 0.04 10*3/uL (ref 0.00–0.07)
Basophils Absolute: 0 10*3/uL (ref 0.0–0.1)
Basophils Relative: 1 %
Eosinophils Absolute: 0.1 10*3/uL (ref 0.0–0.5)
Eosinophils Relative: 2 %
HCT: 33.4 % — ABNORMAL LOW (ref 36.0–46.0)
Hemoglobin: 9.5 g/dL — ABNORMAL LOW (ref 12.0–15.0)
Immature Granulocytes: 1 %
Lymphocytes Relative: 35 %
Lymphs Abs: 2.4 10*3/uL (ref 0.7–4.0)
MCH: 21.9 pg — ABNORMAL LOW (ref 26.0–34.0)
MCHC: 28.4 g/dL — ABNORMAL LOW (ref 30.0–36.0)
MCV: 77.1 fL — ABNORMAL LOW (ref 80.0–100.0)
Monocytes Absolute: 0.5 10*3/uL (ref 0.1–1.0)
Monocytes Relative: 7 %
Neutro Abs: 3.8 10*3/uL (ref 1.7–7.7)
Neutrophils Relative %: 54 %
Platelets: 263 10*3/uL (ref 150–400)
RBC: 4.33 MIL/uL (ref 3.87–5.11)
RDW: 16.4 % — ABNORMAL HIGH (ref 11.5–15.5)
WBC: 6.8 10*3/uL (ref 4.0–10.5)
nRBC: 0 % (ref 0.0–0.2)

## 2019-12-20 LAB — RAPID URINE DRUG SCREEN, HOSP PERFORMED
Amphetamines: NOT DETECTED
Barbiturates: NOT DETECTED
Benzodiazepines: NOT DETECTED
Cocaine: NOT DETECTED
Opiates: NOT DETECTED
Tetrahydrocannabinol: NOT DETECTED

## 2019-12-20 LAB — COMPREHENSIVE METABOLIC PANEL
ALT: 18 U/L (ref 0–44)
AST: 23 U/L (ref 15–41)
Albumin: 3.6 g/dL (ref 3.5–5.0)
Alkaline Phosphatase: 58 U/L (ref 38–126)
Anion gap: 12 (ref 5–15)
BUN: 19 mg/dL (ref 6–20)
CO2: 24 mmol/L (ref 22–32)
Calcium: 9.1 mg/dL (ref 8.9–10.3)
Chloride: 100 mmol/L (ref 98–111)
Creatinine, Ser: 1.11 mg/dL — ABNORMAL HIGH (ref 0.44–1.00)
GFR calc Af Amer: >60 mL/min (ref >60)
GFR calc non Af Amer: 57 mL/min — ABNORMAL LOW (ref >60)
Glucose, Bld: 265 mg/dL — ABNORMAL HIGH (ref 70–99)
Potassium: 4 mmol/L (ref 3.5–5.1)
Sodium: 136 mmol/L (ref 135–145)
Total Bilirubin: 0.1 mg/dL — ABNORMAL LOW (ref 0.3–1.2)
Total Protein: 6.6 g/dL (ref 6.5–8.1)

## 2019-12-20 LAB — URINALYSIS, ROUTINE W REFLEX MICROSCOPIC
Bilirubin Urine: NEGATIVE
Glucose, UA: NEGATIVE mg/dL
Hgb urine dipstick: NEGATIVE
Ketones, ur: NEGATIVE mg/dL
Nitrite: NEGATIVE
Protein, ur: NEGATIVE mg/dL
Specific Gravity, Urine: 1.025 (ref 1.005–1.030)
pH: 6 (ref 5.0–8.0)

## 2019-12-20 LAB — URINALYSIS, MICROSCOPIC (REFLEX): RBC / HPF: NONE SEEN RBC/hpf (ref 0–5)

## 2019-12-20 LAB — LIPASE, BLOOD: Lipase: 22 U/L (ref 11–51)

## 2019-12-20 MED ORDER — METOCLOPRAMIDE HCL 5 MG/ML IJ SOLN
10.0000 mg | Freq: Once | INTRAMUSCULAR | Status: AC
  Administered 2019-12-20: 10 mg via INTRAVENOUS
  Filled 2019-12-20: qty 2

## 2019-12-20 MED ORDER — SODIUM CHLORIDE 0.9 % IV BOLUS
1000.0000 mL | Freq: Once | INTRAVENOUS | Status: AC
  Administered 2019-12-20: 1000 mL via INTRAVENOUS

## 2019-12-20 MED ORDER — HYDROMORPHONE HCL 1 MG/ML IJ SOLN
2.0000 mg | Freq: Once | INTRAMUSCULAR | Status: AC
  Administered 2019-12-20: 2 mg via INTRAVENOUS
  Filled 2019-12-20: qty 2

## 2019-12-20 NOTE — ED Provider Notes (Signed)
[facility] Provider Note: Lowella DellJ. Lane Alic Hilburn, MD, FACEP  CSN: [id number] MRN: [id number] ARRIVAL: 12/19/19 at 2343 ROOM: MH01/MH01   CHIEF COMPLAINT  Abdominal Pain   HISTORY OF PRESENT ILLNESS  12/20/19 3:27 AM Monique Holland is a 54 y.o. female with chronic pain who receives 120, 10 mg oxycodone tablets monthly from Dr. Nilsa Nuttingakwa of pain management.    She is here with abdominal pain associated with nausea and vomiting.  She rates the pain as an 8 out of 10, localizes it to the upper abdomen, and characterizes it as like previous pancreatitis.  She was seen in the ED for this 12/15/2019 and had a normal lipase.  She denies diarrhea.   Past Medical History:  Diagnosis Date  . Anxiety   . Asthma   . Bipolar disorder (HCC)   . Depression   . Diabetes mellitus without complication (HCC)   . Frequency of urination   . GERD (gastroesophageal reflux disease)   . History of asthma    last episode yrs ago  . Hypertension   . OSA (obstructive sleep apnea)    pt had study done oct 2014--  schedule for cpap titrate after kidney stone surgery  . Pancreatitis   . Pseudocyst of pancreas   . Schizophrenia (HCC)   . Ureteral calculi    BILATERAL  . Wears glasses     Past Surgical History:  Procedure Laterality Date  . BALLOON DILATION N/A 08/23/2019   Procedure: BALLOON DILATION;  Surgeon: Meridee ScoreMansouraty, Netty StarringGabriel Jr., MD;  [facility];  Service: Gastroenterology;  Laterality: N/A;  . BIOPSY  07/18/2019   Procedure: BIOPSY;  Surgeon: Shellia Cleverlyirigliano, Vito V, DO;  [facility];  Service: Gastroenterology;;  . BIOPSY  08/23/2019   Procedure: BIOPSY;  Surgeon: Lemar LoftyMansouraty, Gabriel Jr., MD;  [facility];  Service: Gastroenterology;;  . BIOPSY  09/18/2019   Procedure: BIOPSY;  Surgeon: Lemar LoftyMansouraty, Gabriel Jr., MD;  [facility];  Service: Gastroenterology;;  . BIOPSY  10/18/2019   Procedure: BIOPSY;  Surgeon: Lemar LoftyMansouraty, Gabriel Jr., MD;  [facility];  Service: Gastroenterology;;  . CESAREAN SECTION  1991  &  2002   w/ bilateral tubal ligation in 2002  . CYSTOSCOPY W/ URETERAL STENT PLACEMENT Right 08/10/2018   Procedure: CYSTOSCOPY WITH RETROGRADE PYELOGRAM/URETERAL STENT PLACEMENT;  Surgeon: Jerilee FieldEskridge, Matthew, MD;  [facility];  Service: Urology;  Laterality: Right;  . CYSTOSCOPY WITH RETROGRADE PYELOGRAM, URETEROSCOPY AND STENT PLACEMENT Bilateral 05/02/2013   Procedure: CYSTOSCOPY WITH RETROGRADE PYELOGRAM, URETEROSCOPY ;  Surgeon: Valetta Fulleravid S Grapey, MD;  [facility] [place];  Service: Urology;  Laterality: Bilateral;  . CYSTOSCOPY WITH RETROGRADE PYELOGRAM, URETEROSCOPY AND STENT PLACEMENT Right 08/15/2018   Procedure: CYSTOSCOPY WITH RIGHT RETROGRADE PYELOGRAM, RIGHT URETEROSCOPY WITH HOLMIUM LASER AND STENT PLACEMENT;  Surgeon: Crista ElliotBell, Eugene D III, MD;  [facility];  Service: Urology;  Laterality: Right;  . CYSTOSCOPY WITH STENT PLACEMENT Left 05/02/2013   Procedure: CYSTOSCOPY WITH STENT PLACEMENT;  Surgeon: Valetta Fulleravid S Grapey, MD;  [facility] [place];  Service: Urology;  Laterality: Left;  . ENDOROTOR N/A 08/26/2019   Procedure: HQIONGEXBENDOROTOR;  Surgeon: Mansouraty, Netty StarringGabriel Jr., MD;  [facility];  Service: Gastroenterology;  Laterality: N/A;  . ENDOROTOR  09/05/2019   Procedure: [id number];  Surgeon: Mansouraty, Netty StarringGabriel Jr., MD;  [facility];  Service: Gastroenterology;;  . ESOPHAGOGASTRODUODENOSCOPY N/A 08/26/2019   Procedure: ESOPHAGOGASTRODUODENOSCOPY (EGD) with NECROSECTOMY;  Surgeon: Lemar LoftyMansouraty, Gabriel Jr., MD;  [facility];  Service: Gastroenterology;  Laterality: N/A;  . ESOPHAGOGASTRODUODENOSCOPY N/A 10/18/2019   Procedure: ESOPHAGOGASTRODUODENOSCOPY (EGD);  Surgeon: Lemar Lofty., MD;  [facility];  Service: Gastroenterology;  Laterality: N/A;  . ESOPHAGOGASTRODUODENOSCOPY (EGD) WITH PROPOFOL N/A 07/18/2019   Procedure:  ESOPHAGOGASTRODUODENOSCOPY (EGD) WITH PROPOFOL;  Surgeon: Shellia Cleverly, DO;  [facility];  Service: Gastroenterology;  Laterality: N/A;  . ESOPHAGOGASTRODUODENOSCOPY (EGD) WITH PROPOFOL N/A 08/18/2019   Procedure: ESOPHAGOGASTRODUODENOSCOPY (EGD) WITH PROPOFOL;  Surgeon: Hilarie Fredrickson, MD;  [facility];  Service: Endoscopy;  Laterality: N/A;  . ESOPHAGOGASTRODUODENOSCOPY (EGD) WITH PROPOFOL N/A 08/21/2019   Procedure: ESOPHAGOGASTRODUODENOSCOPY (EGD) WITH PROPOFOL;  Surgeon: Meridee Score Netty Starring., MD;  [facility];  Service: Gastroenterology;  Laterality: N/A;  . ESOPHAGOGASTRODUODENOSCOPY (EGD) WITH PROPOFOL N/A 08/23/2019   Procedure: ESOPHAGOGASTRODUODENOSCOPY (EGD) WITH PROPOFOL;  Surgeon: Meridee Score Netty Starring., MD;  [facility];  Service: Gastroenterology;  Laterality: N/A;  . ESOPHAGOGASTRODUODENOSCOPY (EGD) WITH PROPOFOL N/A 09/05/2019   Procedure: ESOPHAGOGASTRODUODENOSCOPY (EGD) WITH PROPOFOL;  Surgeon: Meridee Score Netty Starring., MD;  Location: [facility]COPY;  Service: Gastroenterology;  Laterality: N/A;   With pancreatic pseudocyst necrosectomy via established cyst gastrostomy  . ESOPHAGOGASTRODUODENOSCOPY (EGD) WITH PROPOFOL N/A 09/18/2019   Procedure: ESOPHAGOGASTRODUODENOSCOPY (EGD) WITH PROPOFOL;  Surgeon: Meridee Score Netty Starring., MD;  [facility];  Service: Gastroenterology;  Laterality: N/A;  . EUS  08/23/2019   Procedure: UPPER ENDOSCOPIC ULTRASOUND (EUS) LINEAR;  Surgeon: Lemar Lofty., MD;  [facility];  Service: Gastroenterology;;  . FOREIGN BODY REMOVAL  09/05/2019   Procedure: FOREIGN BODY REMOVAL;  Surgeon: Lemar Lofty., MD;  Location[facility];  Service: Gastroenterology;;  . FOREIGN BODY REMOVAL  09/18/2019   Procedure: FOREIGN BODY REMOVAL;  Surgeon: Lemar Lofty., MD;  [facility];  Service: Gastroenterology;;  . HOLMIUM LASER APPLICATION Bilateral 05/02/2013    Procedure: HOLMIUM LASER APPLICATION;  Surgeon: Valetta Fuller, MD;  Location[facility];  Service: Urology;  Laterality: Bilateral;  . LAPAROSCOPIC APPENDECTOMY N/A 05/22/2014   Procedure: APPENDECTOMY LAPAROSCOPIC;  Surgeon: Glenna Fellows, MD;  [facility];  Service: General;  Laterality: N/A;  . PANCREATIC STENT PLACEMENT  08/23/2019   Procedure: PANCREATIC STENT PLACEMENT;  Surgeon: Lemar Lofty., MD;  [facility];  Service: Gastroenterology;;  . PANCREATIC STENT PLACEMENT  09/05/2019   Procedure: PANCREATIC STENT PLACEMENT;  Surgeon: Lemar Lofty., MD;  Location: [facility] ENDOSCOPY;  Service: Gastroenterology;;  . PANCREATIC STENT PLACEMENT  09/18/2019   Procedure: PANCREATIC STENT PLACEMENT;  Surgeon: Lemar Lofty., MD;  [facility];  Service: Gastroenterology;;  cyst gastrostomy double pigtail stent placement   . STENT REMOVAL  08/26/2019   Procedure: STENT REMOVAL;  Surgeon: Lemar Lofty., MD;  Location[facility];  Service: Gastroenterology;;  . Francine Graven REMOVAL  09/05/2019   Procedure: STENT REMOVAL;  Surgeon: Lemar Lofty., MD;  Location[facility];  Service: Gastroenterology;;  . Francine Graven REMOVAL  09/18/2019   Procedure: STENT REMOVAL;  Surgeon: Lemar Lofty., MD;  [facility];  Service: Gastroenterology;;  . TONSILLECTOMY  age 76  . UPPER ESOPHAGEAL ENDOSCOPIC ULTRASOUND (EUS) N/A 08/21/2019   Procedure: UPPER ESOPHAGEAL ENDOSCOPIC ULTRASOUND (EUS);  Surgeon: Lemar Lofty., MD;  [facility];  Service: Gastroenterology;  Laterality: N/A;  For cyst gastrostomy    Family History  Family history unknown: Yes    Social History   Tobacco Use  . Smoking status: Never Smoker  . Smokeless tobacco: Never Used  Vaping Use  .  Vaping Use: Never used  Substance Use Topics  . Alcohol use: No  . Drug use: No    Prior to Admission medications   Medication Sig  Start Date End Date Taking? Authorizing Provider  albuterol (VENTOLIN HFA) 108 (90 Base) MCG/ACT inhaler Inhale 1-2 puffs into the lungs every 6 (six) hours as needed for wheezing or shortness of breath. 11/04/19   Rancour, Jeannett Senior, MD  amitriptyline (ELAVIL) 75 MG tablet Take 75 mg by mouth at bedtime. 08/10/19   [provider]  amLODipine (NORVASC) 5 MG tablet Take 5 mg by mouth daily.    [provider]  ARIPiprazole (ABILIFY) 2 MG tablet Take 2 mg by mouth daily. 10/22/19   [provider]  atorvastatin (LIPITOR) 40 MG tablet Take 40 mg by mouth daily.    [provider]  clonazePAM (KLONOPIN) 0.5 MG tablet Take 0.5 mg by mouth daily.  05/23/19   [provider]  diclofenac (VOLTAREN) 75 MG EC tablet Take 75 mg by mouth 2 (two) times daily. 10/30/19   [provider]  JANUMET 50-1000 MG tablet Take 1 tablet by mouth 2 (two) times daily. 09/24/19   [provider]  lamoTRIgine (LAMICTAL) 200 MG tablet Take 200 mg by mouth daily. 10/30/19   [provider]  LANTUS SOLOSTAR 100 UNIT/ML Solostar Pen Inject 60 Units into the skin 2 (two) times daily. 11/17/19   Swayze, Ava, DO  LATUDA 80 MG TABS tablet Take 80 mg by mouth at bedtime. 11/06/19   [provider]  losartan (COZAAR) 25 MG tablet Take 25 mg by mouth daily.    [provider]  ondansetron (ZOFRAN ODT) 4 MG disintegrating tablet 4mg  ODT q4 hours prn nausea/vomit 12/15/19   12/17/19, DO  ondansetron (ZOFRAN) 8 MG tablet Take 1 tablet (8 mg total) by mouth every 8 (eight) hours as needed for nausea or vomiting. 11/17/19   Swayze, Ava, DO  Oxycodone HCl 10 MG TABS Take 10 mg by mouth every 4 (four) hours as needed (pain). Taking once daily as needed 08/12/19   [provider]  pantoprazole (PROTONIX) 40 MG tablet TAKE 1 TABLET (40 MG TOTAL) BY MOUTH 2 (TWO) TIMES DAILY FOR 28 DAYS. 12/09/19 01/06/20  Anyanwu, 01/08/20, MD  promethazine (PHENERGAN) 6.25 MG/5ML  syrup Take 5 mLs by mouth 2 (two) times daily as needed for nausea/vomiting. 11/08/19   [provider]  Promethazine-Codeine 6.25-10 MG/5ML SOLN Take 5 mLs by mouth every 4 (four) hours as needed for cough. 10/30/19   [provider]  propranolol (INDERAL) 20 MG tablet Take 20 mg by mouth 2 (two) times daily.    [provider]  risperiDONE (RISPERDAL) 0.5 MG tablet Take 0.5 mg by mouth at bedtime.  09/22/19   [provider]  traZODone (DESYREL) 100 MG tablet Take 100 mg by mouth at bedtime.    [provider]  triamcinolone cream (KENALOG) 0.1 % Apply 1 application topically 3 (three) times daily. 11/26/19   [provider]    Allergies Patient has no known allergies.   REVIEW OF SYSTEMS  Negative except as noted here or in the History of Present Illness.   PHYSICAL EXAMINATION  Initial Vital Signs Blood pressure (!) 112/62, pulse 93, temperature 98.6 F (37 C), resp. rate 18, height 5\' 8"  (1.727 m), weight (!) 124.3 kg, last menstrual period 03/23/2014, SpO2 100 %.  Examination General: Well-developed, obese female in no acute distress; appearance consistent with age of record  HENT: normocephalic; atraumatic Eyes: pupils equal, round and reactive to light; extraocular muscles intact Neck: supple Heart: regular rate and rhyth Lungs: clear to auscultation bilaterally Abdomen: soft; nondistended; epigastric tenderness; bowel sounds present Extremities: No deformity; full range of motion; pulses normal Neurologic: Awake, alert and oriented; motor function intact in all extremities and symmetric; no facial droop Skin: Warm and dry Psychiatric: Flat affect   RESULTS  Summary of this visit's results, reviewed and interpreted by myself:   EKG Interpretation  Date/Time:    Ventricular Rate:    PR Interval:    QRS Duration:   QT Interval:    QTC Calculation:   R Axis:     Text Interpretation:        Laboratory  Studies: Results for orders placed or performed during the hospital encounter of 12/20/19 (from the past 24 hour(s))  Urinalysis, Routine w reflex microscopic     Status: Abnormal   Collection Time: 12/19/19 11:56 PM  Result Value Ref Range   Color, Urine YELLOW YELLOW   APPearance CLEAR CLEAR   Specific Gravity, Urine 1.025 1.005 - 1.030   pH 6.0 5.0 - 8.0   Glucose, UA NEGATIVE NEGATIVE mg/dL   Hgb urine dipstick NEGATIVE NEGATIVE   Bilirubin Urine NEGATIVE NEGATIVE   Ketones, ur NEGATIVE NEGATIVE mg/dL   Protein, ur NEGATIVE NEGATIVE mg/dL   Nitrite NEGATIVE NEGATIVE   Leukocytes,Ua TRACE (A) NEGATIVE  Urinalysis, Microscopic (reflex)     Status: Abnormal   Collection Time: 12/19/19 11:56 PM  Result Value Ref Range   RBC / HPF NONE SEEN 0 - 5 RBC/hpf   WBC, UA 6-10 0 - 5 WBC/hpf   Bacteria, UA RARE (A) NONE SEEN   Squamous Epithelial / LPF 0-5 0 - 5  Rapid urine drug screen (hospital performed)     Status: None   Collection Time: 12/19/19 11:56 PM  Result Value Ref Range   Opiates NONE DETECTED NONE DETECTED   Cocaine NONE DETECTED NONE DETECTED   Benzodiazepines NONE DETECTED NONE DETECTED   Amphetamines NONE DETECTED NONE DETECTED   Tetrahydrocannabinol NONE DETECTED NONE DETECTED   Barbiturates NONE DETECTED NONE DETECTED  CBC with Differential/Platelet     Status: Abnormal   Collection Time: 12/20/19  2:11 AM  Result Value Ref Range   WBC 6.8 4.0 - 10.5 K/uL   RBC 4.33 3.87 - 5.11 MIL/uL   Hemoglobin 9.5 (L) 12.0 - 15.0 g/dL   HCT 46.9 (L) 36 - 46 %   MCV 77.1 (L) 80.0 - 100.0 fL   MCH 21.9 (L) 26.0 - 34.0 pg   MCHC 28.4 (L) 30.0 - 36.0 g/dL   RDW 62.9 (H) 52.8 - 41.3 %   Platelets 263 150 - 400 K/uL   nRBC 0.0 0.0 - 0.2 %   Neutrophils Relative % 54 %   Neutro Abs 3.8 1.7 - 7.7 K/uL   Lymphocytes Relative 35 %   Lymphs Abs 2.4 0.7 - 4.0 K/uL   Monocytes Relative 7 %   Monocytes Absolute 0.5 0 - 1 K/uL   Eosinophils Relative 2 %   Eosinophils Absolute 0.1 0  - 0 K/uL   Basophils Relative 1 %   Basophils Absolute 0.0 0 - 0 K/uL   Immature Granulocytes 1 %   Abs Immature Granulocytes 0.04 0.00 - 0.07 K/uL  Lipase, blood     Status: None   Collection Time: 12/20/19  2:11 AM  Result Value Ref Range   Lipase 22 11 -  51 U/L  Comprehensive metabolic panel     Status: Abnormal   Collection Time: 12/20/19  2:11 AM  Result Value Ref Range   Sodium 136 135 - 145 mmol/L   Potassium 4.0 3.5 - 5.1 mmol/L   Chloride 100 98 - 111 mmol/L   CO2 24 22 - 32 mmol/L   Glucose, Bld 265 (H) 70 - 99 mg/dL   BUN 19 6 - 20 mg/dL   Creatinine, Ser 0.86 (H) 0.44 - 1.00 mg/dL   Calcium 9.1 8.9 - 57.8 mg/dL   Total Protein 6.6 6.5 - 8.1 g/dL   Albumin 3.6 3.5 - 5.0 g/dL   AST 23 15 - 41 U/L   ALT 18 0 - 44 U/L   Alkaline Phosphatase 58 38 - 126 U/L   Total Bilirubin 0.1 (L) 0.3 - 1.2 mg/dL   GFR calc non Af Amer 57 (L) >60 mL/min   GFR calc Af Amer >60 >60 mL/min   Anion gap 12 5 - 15   Imaging Studies: No results found.  ED COURSE and MDM  Nursing notes, initial and subsequent vitals signs, including pulse oximetry, reviewed and interpreted by myself.  Vitals:   12/19/19 2347 12/20/19 0026 12/20/19 0321  BP:   (!) 112/62  Pulse:  97 93  Resp:  18 18  Temp:  98.6 F (37 C)   SpO2:  96% 100%  Weight: (!) 124.3 kg    Height:  (1.727 m)     Medications  HYDROmorphone (DILAUDID) injection 2 mg (has no administration in time range)  sodium chloride 0.9 % bolus 1,000 mL (0 mLs Intravenous Stopped 12/20/19 0321)  metoCLOPramide (REGLAN) injection 10 mg (10 mg Intravenous Given 12/20/19 0214)   3:47 AM Patient's nausea is controlled with Reglan.  She was given an IV fluid bolus.  Her vital signs are stable and she is nontoxic-appearing.  Her lipase is normal.  She may have chronic pancreatic pain.  As noted above she is on chronic high-dose narcotic therapy she was advised that we cannot place her on any new outpatient narcotics.  We will treat with a  dose of Dilaudid and discharge her home.   PROCEDURES  Procedures   ED DIAGNOSES     ICD-10-CM   1. Chronic abdominal pain  R10.9    G89.29        Suzzanne Brunkhorst, Jonny Ruiz, MD 12/20/19 [phone]

## 2019-12-20 NOTE — ED Notes (Signed)
Pt continues to complain of pain, EDP [place] notified. No new orders given, plan remains to discharge

## 2019-12-20 NOTE — Telephone Encounter (Signed)
Patient is calling to reschedule the procedure at the hospital

## 2019-12-20 NOTE — Telephone Encounter (Signed)
The pt has an appt with Dr Barron Alvine on 8/3 and will discuss her concerns at this time.

## 2019-12-20 NOTE — ED Notes (Signed)
SpO2 on room air down to 88%. Placed on 2L [place]. Pt up to 100%. Updated on plan of care at this time - pending discharge as soon as O2 is stable.

## 2019-12-24 ENCOUNTER — Ambulatory Visit: Payer: Medicare Other | Admitting: Gastroenterology

## 2019-12-30 ENCOUNTER — Other Ambulatory Visit: Payer: Self-pay | Admitting: Obstetrics & Gynecology

## 2020-01-11 ENCOUNTER — Other Ambulatory Visit: Payer: Self-pay

## 2020-01-11 ENCOUNTER — Inpatient Hospital Stay (HOSPITAL_BASED_OUTPATIENT_CLINIC_OR_DEPARTMENT_OTHER)
Admission: EM | Admit: 2020-01-11 | Discharge: 2020-01-15 | DRG: 880 | Disposition: A | Discharge status: Home Health | Payer: Medicare Other | Attending: Neurology | Admitting: Neurology

## 2020-01-11 ENCOUNTER — Emergency Department (HOSPITAL_BASED_OUTPATIENT_CLINIC_OR_DEPARTMENT_OTHER): Payer: Medicare Other

## 2020-01-11 ENCOUNTER — Encounter (HOSPITAL_BASED_OUTPATIENT_CLINIC_OR_DEPARTMENT_OTHER): Payer: Self-pay | Admitting: Emergency Medicine

## 2020-01-11 DIAGNOSIS — Z20822 Contact with and (suspected) exposure to covid-19: Secondary | ICD-10-CM | POA: Diagnosis present

## 2020-01-11 DIAGNOSIS — Z6841 Body Mass Index (BMI) 40.0 and over, adult: Secondary | ICD-10-CM | POA: Diagnosis not present

## 2020-01-11 DIAGNOSIS — E785 Hyperlipidemia, unspecified: Secondary | ICD-10-CM | POA: Diagnosis not present

## 2020-01-11 DIAGNOSIS — I10 Essential (primary) hypertension: Secondary | ICD-10-CM | POA: Diagnosis present

## 2020-01-11 DIAGNOSIS — J45909 Unspecified asthma, uncomplicated: Secondary | ICD-10-CM | POA: Diagnosis not present

## 2020-01-11 DIAGNOSIS — H532 Diplopia: Secondary | ICD-10-CM | POA: Diagnosis not present

## 2020-01-11 DIAGNOSIS — R531 Weakness: Secondary | ICD-10-CM

## 2020-01-11 DIAGNOSIS — Z9282 Status post administration of tPA (rtPA) in a different facility within the last 24 hours prior to admission to current facility: Secondary | ICD-10-CM

## 2020-01-11 DIAGNOSIS — I639 Cerebral infarction, unspecified: Secondary | ICD-10-CM

## 2020-01-11 DIAGNOSIS — R2990 Unspecified symptoms and signs involving the nervous system: Secondary | ICD-10-CM | POA: Diagnosis present

## 2020-01-11 DIAGNOSIS — E119 Type 2 diabetes mellitus without complications: Secondary | ICD-10-CM

## 2020-01-11 DIAGNOSIS — Z8249 Family history of ischemic heart disease and other diseases of the circulatory system: Secondary | ICD-10-CM | POA: Diagnosis not present

## 2020-01-11 DIAGNOSIS — F319 Bipolar disorder, unspecified: Secondary | ICD-10-CM | POA: Diagnosis present

## 2020-01-11 DIAGNOSIS — F209 Schizophrenia, unspecified: Secondary | ICD-10-CM | POA: Diagnosis present

## 2020-01-11 DIAGNOSIS — F449 Dissociative and conversion disorder, unspecified: Principal | ICD-10-CM | POA: Diagnosis present

## 2020-01-11 DIAGNOSIS — E1165 Type 2 diabetes mellitus with hyperglycemia: Secondary | ICD-10-CM | POA: Diagnosis not present

## 2020-01-11 DIAGNOSIS — Z8673 Personal history of transient ischemic attack (TIA), and cerebral infarction without residual deficits: Secondary | ICD-10-CM | POA: Diagnosis not present

## 2020-01-11 DIAGNOSIS — Z79899 Other long term (current) drug therapy: Secondary | ICD-10-CM | POA: Diagnosis not present

## 2020-01-11 DIAGNOSIS — R200 Anesthesia of skin: Secondary | ICD-10-CM

## 2020-01-11 DIAGNOSIS — G4733 Obstructive sleep apnea (adult) (pediatric): Secondary | ICD-10-CM | POA: Diagnosis not present

## 2020-01-11 DIAGNOSIS — Z87442 Personal history of urinary calculi: Secondary | ICD-10-CM

## 2020-01-11 HISTORY — DX: Cerebral infarction, unspecified: I63.9

## 2020-01-11 LAB — COMPREHENSIVE METABOLIC PANEL
ALT: 18 U/L (ref 0–44)
AST: 19 U/L (ref 15–41)
Albumin: 3.9 g/dL (ref 3.5–5.0)
Alkaline Phosphatase: 57 U/L (ref 38–126)
Anion gap: 12 (ref 5–15)
BUN: 16 mg/dL (ref 6–20)
CO2: 25 mmol/L (ref 22–32)
Calcium: 9.4 mg/dL (ref 8.9–10.3)
Chloride: 101 mmol/L (ref 98–111)
Creatinine, Ser: 1.05 mg/dL — ABNORMAL HIGH (ref 0.44–1.00)
GFR calc Af Amer: >60 mL/min (ref >60)
GFR calc non Af Amer: >60 mL/min (ref >60)
Glucose, Bld: 175 mg/dL — ABNORMAL HIGH (ref 70–99)
Potassium: 4.3 mmol/L (ref 3.5–5.1)
Sodium: 138 mmol/L (ref 135–145)
Total Bilirubin: 0.1 mg/dL — ABNORMAL LOW (ref 0.3–1.2)
Total Protein: 6.9 g/dL (ref 6.5–8.1)

## 2020-01-11 LAB — URINALYSIS, ROUTINE W REFLEX MICROSCOPIC
Bilirubin Urine: NEGATIVE
Glucose, UA: NEGATIVE mg/dL
Hgb urine dipstick: NEGATIVE
Ketones, ur: NEGATIVE mg/dL
Leukocytes,Ua: NEGATIVE
Nitrite: NEGATIVE
Protein, ur: NEGATIVE mg/dL
Specific Gravity, Urine: 1.015 (ref 1.005–1.030)
pH: 6.5 (ref 5.0–8.0)

## 2020-01-11 LAB — CBC
HCT: 34.3 % — ABNORMAL LOW (ref 36.0–46.0)
Hemoglobin: 10.1 g/dL — ABNORMAL LOW (ref 12.0–15.0)
MCH: 22.8 pg — ABNORMAL LOW (ref 26.0–34.0)
MCHC: 29.4 g/dL — ABNORMAL LOW (ref 30.0–36.0)
MCV: 77.4 fL — ABNORMAL LOW (ref 80.0–100.0)
Platelets: 278 10*3/uL (ref 150–400)
RBC: 4.43 MIL/uL (ref 3.87–5.11)
RDW: 15.4 % (ref 11.5–15.5)
WBC: 7.4 10*3/uL (ref 4.0–10.5)
nRBC: 0 % (ref 0.0–0.2)

## 2020-01-11 LAB — DIFFERENTIAL
Abs Immature Granulocytes: 0.03 10*3/uL (ref 0.00–0.07)
Basophils Absolute: 0 10*3/uL (ref 0.0–0.1)
Basophils Relative: 1 %
Eosinophils Absolute: 0.3 10*3/uL (ref 0.0–0.5)
Eosinophils Relative: 4 %
Immature Granulocytes: 0 %
Lymphocytes Relative: 28 %
Lymphs Abs: 2.1 10*3/uL (ref 0.7–4.0)
Monocytes Absolute: 0.7 10*3/uL (ref 0.1–1.0)
Monocytes Relative: 9 %
Neutro Abs: 4.3 10*3/uL (ref 1.7–7.7)
Neutrophils Relative %: 58 %

## 2020-01-11 LAB — ETHANOL: Alcohol, Ethyl (B): <10 mg/dL (ref <10)

## 2020-01-11 LAB — PREGNANCY, URINE: Preg Test, Ur: NEGATIVE

## 2020-01-11 LAB — RAPID URINE DRUG SCREEN, HOSP PERFORMED
Amphetamines: NOT DETECTED
Barbiturates: NOT DETECTED
Benzodiazepines: NOT DETECTED
Cocaine: NOT DETECTED
Opiates: NOT DETECTED
Tetrahydrocannabinol: NOT DETECTED

## 2020-01-11 LAB — PROTIME-INR
INR: 0.9 (ref 0.8–1.2)
Prothrombin Time: 12.1 seconds (ref 11.4–15.2)

## 2020-01-11 LAB — CBG MONITORING, ED: Glucose-Capillary: 147 mg/dL — ABNORMAL HIGH (ref 70–99)

## 2020-01-11 LAB — APTT: aPTT: 29 seconds (ref 24–36)

## 2020-01-11 LAB — SARS CORONAVIRUS 2 BY RT PCR (HOSPITAL ORDER, PERFORMED IN ~~LOC~~ HOSPITAL LAB): SARS Coronavirus 2: NEGATIVE

## 2020-01-11 IMAGING — CT CT HEAD CODE STROKE
3 series · 16 of 47 positions shown, 19 images · non-contrast
Comparison: MRI [DATE].  CT [DATE].

CLINICAL DATA: Code stroke. Acute stroke presentation. Right-sided
weakness. 2.5 hours duration.

EXAM:
CT HEAD WITHOUT CONTRAST
TECHNIQUE: Contiguous axial images were obtained from the base of the skull
through the vertex without intravenous contrast.

[Series 2: head wo · axial · 0.49mm/px · z∈[+1118,+1263]mm · 10 of 35 slices shown, 13 images]
[im 3/35  brain]
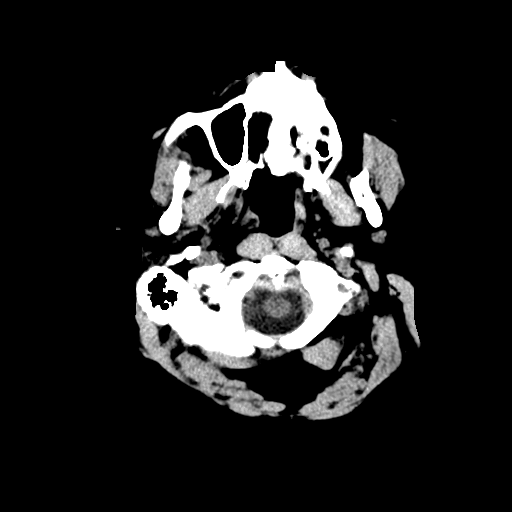
[im 3/35  bone]
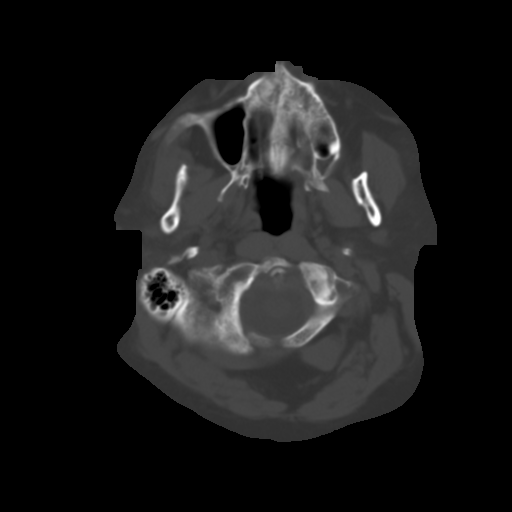
[im 6/35  brain]
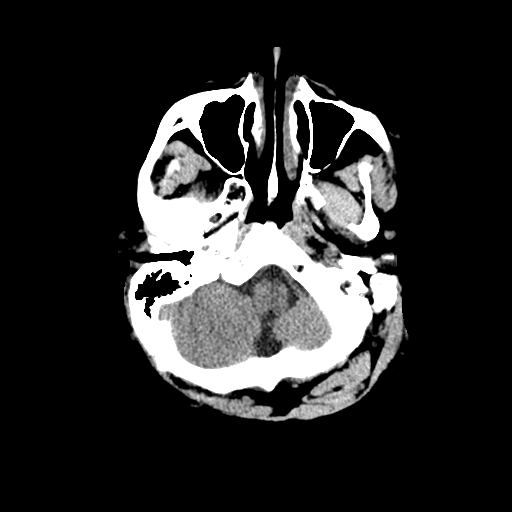
[im 10/35  brain]
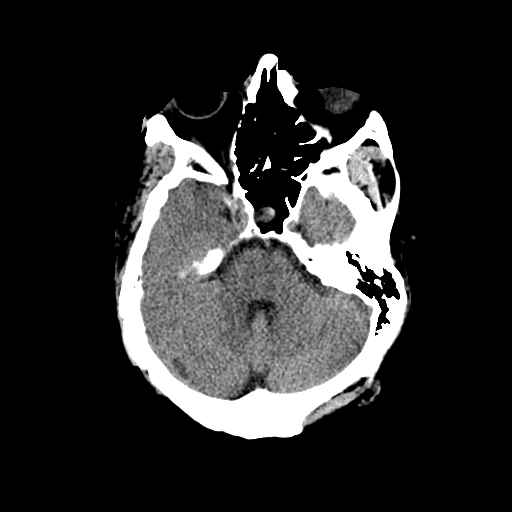
[im 12/35  brain]
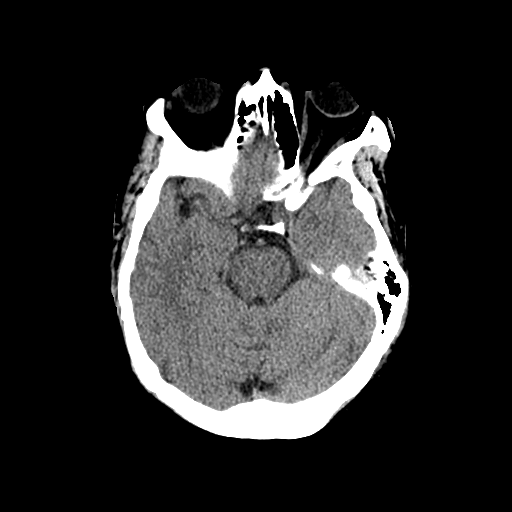
[im 16/35  brain]
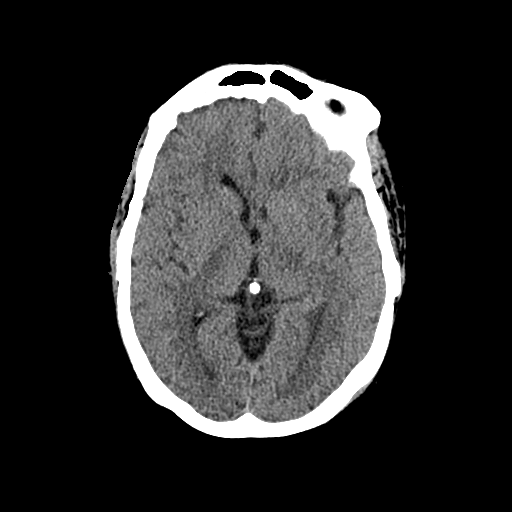
[im 16/35  bone]
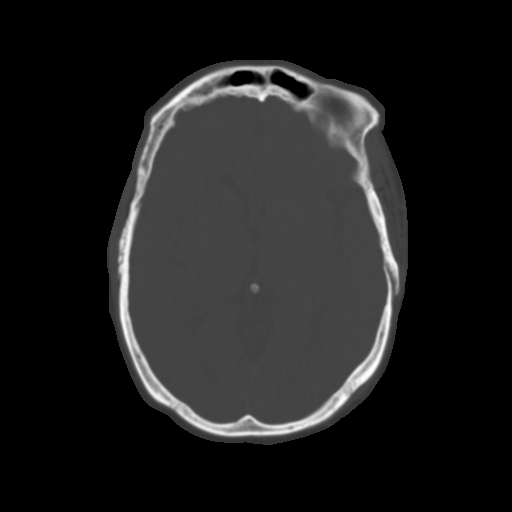
[im 19/35  brain]
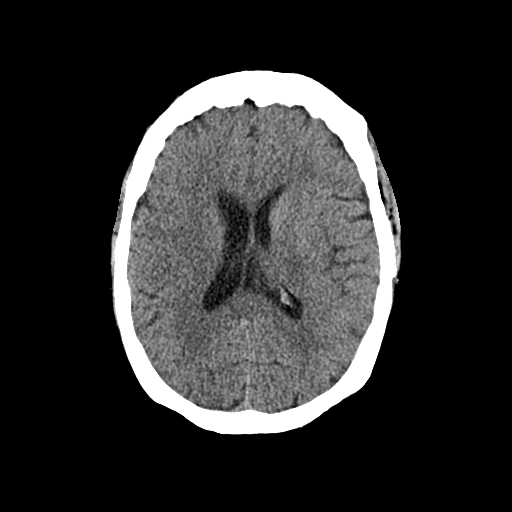
[im 23/35  brain]
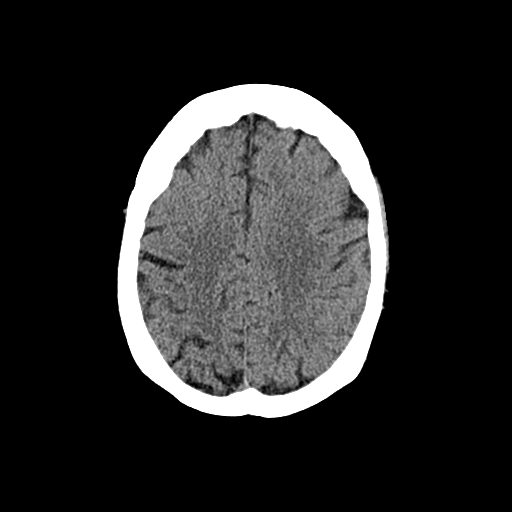
[im 26/35  brain]
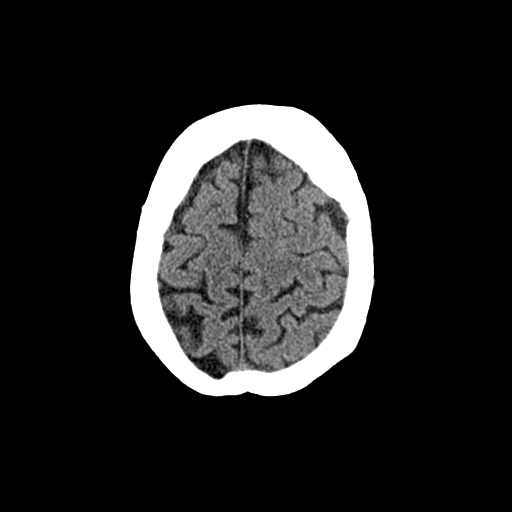
[im 29/35  brain]
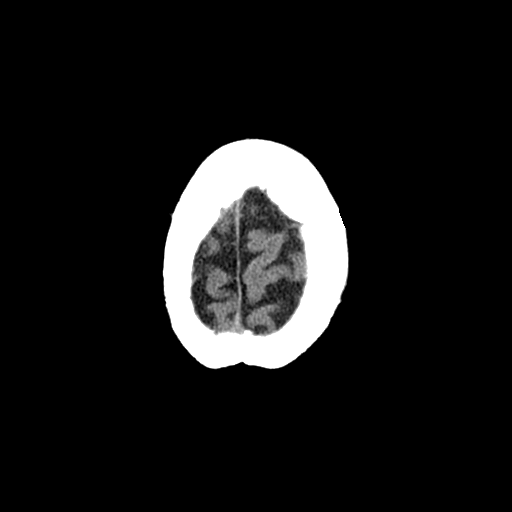
[im 29/35  bone]
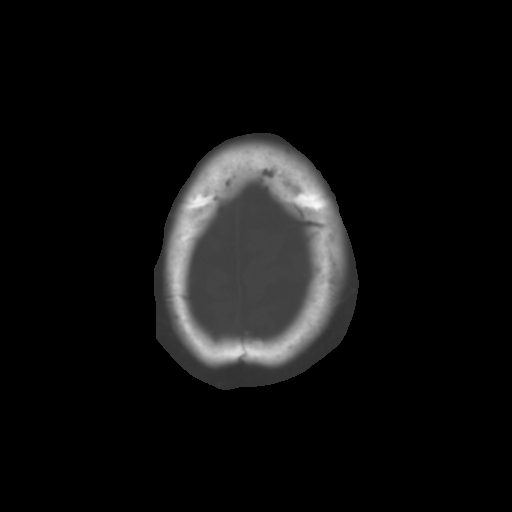
[im 32/35  brain]
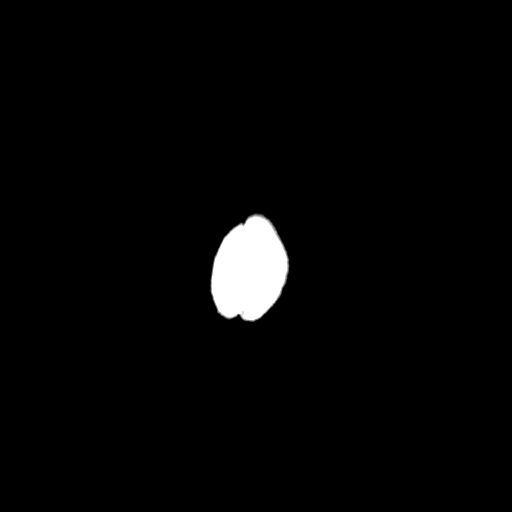

[Series 4: cor soft · coronal · 0.34mm/px · 3 of 72 slices shown]
[im 24/72  brain]
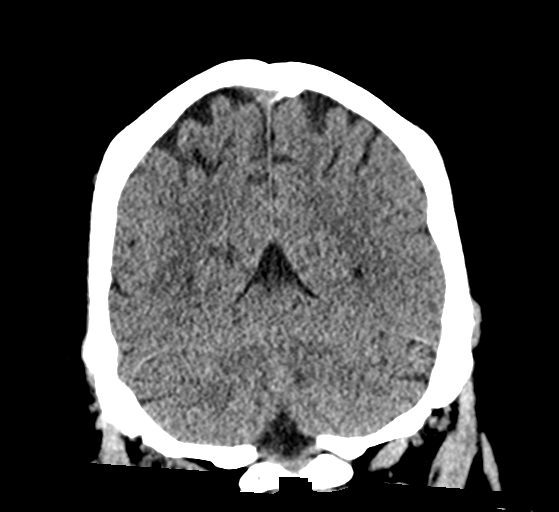
[im 32/72  brain]
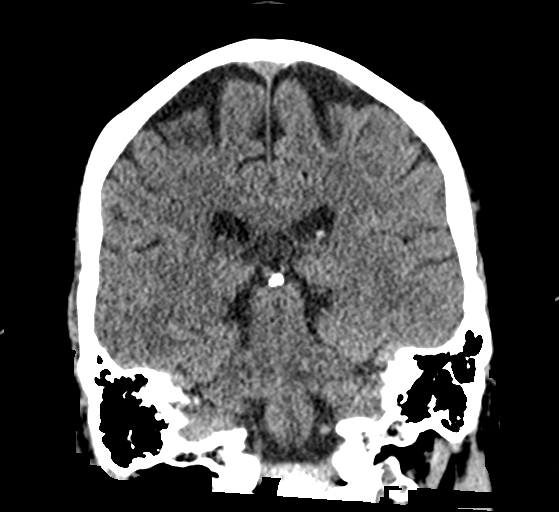
[im 40/72  brain]
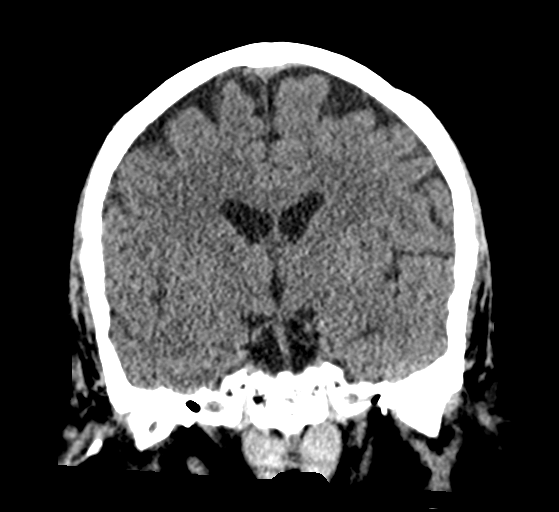

[Series 5: sag soft · sagittal · 0.34mm/px · 3 of 59 slices shown]
[im 20/59  brain]
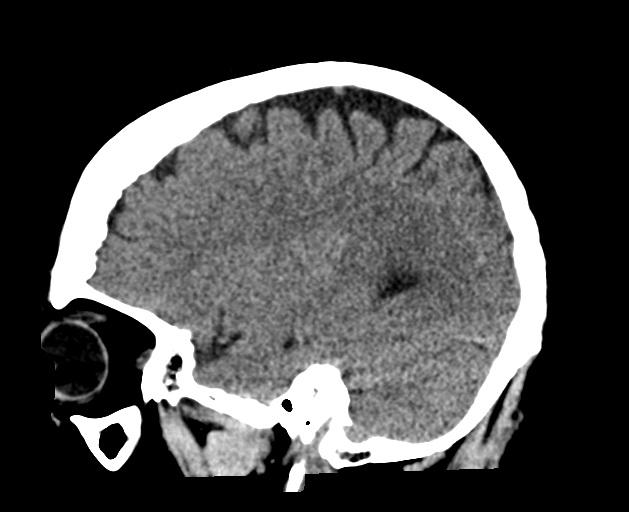
[im 30/59  brain]
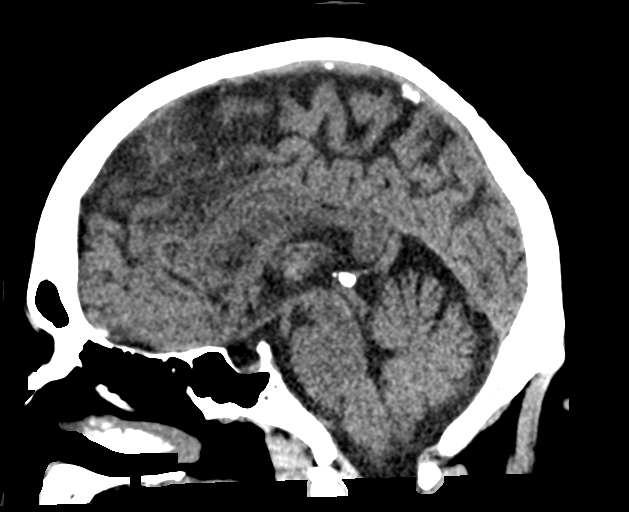
[im 39/59  brain]
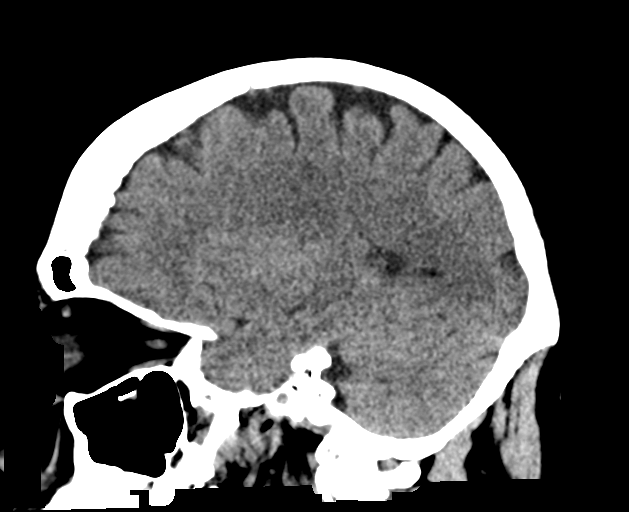

[16 of 47 positions shown; findings below may reference images not displayed]

FINDINGS: Brain: The brain has normal appearance without evidence of atrophy,
old or acute infarction, mass lesion, hemorrhage, hydrocephalus or
extra-axial collection.

Vascular: No abnormal vascular finding.

Skull: Negative

Sinuses/Orbits: Clear

Other: None

ASPECTS (Alberta Stroke Program Early CT Score)

- Ganglionic level infarction (caudate, lentiform nuclei, internal
capsule, insula, M1-M3 cortex): 7

- Supraganglionic infarction (M4-M6 cortex): 3

Total score (0-10 with 10 being normal): 10
IMPRESSION: 1. Normal head CT as seen previously.
2. ASPECTS is 10
3. These results were called by telephone at the time of
interpretation on [DATE] at [DATE] to provider LA ROSA , who
verbally acknowledged these results.

## 2020-01-11 MED ORDER — ALTEPLASE 100 MG IV SOLR
INTRAVENOUS | Status: AC
  Filled 2020-01-11: qty 100

## 2020-01-11 MED ORDER — SODIUM CHLORIDE 0.9 % IV SOLN
50.0000 mL | Freq: Once | INTRAVENOUS | Status: AC
  Administered 2020-01-11: 50 mL via INTRAVENOUS

## 2020-01-11 MED ORDER — ALTEPLASE (STROKE) FULL DOSE INFUSION
90.0000 mg | Freq: Once | INTRAVENOUS | Status: AC
  Administered 2020-01-11: 90 mg via INTRAVENOUS
  Filled 2020-01-11: qty 100

## 2020-01-11 MED ORDER — IOHEXOL 350 MG/ML SOLN
100.0000 mL | Freq: Once | INTRAVENOUS | Status: AC | PRN
  Administered 2020-01-11: 100 mL via INTRAVENOUS

## 2020-01-11 NOTE — ED Notes (Addendum)
[facility] at bedside to transport to [facility] ED. Report given to [employer]. Unable to obtain CT angio d/t difficult venous access. Patient had received contrast to IV in left [facility] which infiltrated during scan - of contrast infiltrated - pt will need to have surgical consult at [facility] per radiology. Pt has very inconsistent exam neurologically. Pt will hold her right arm up sometimes when asked and other times she will not move her arm at all. When completing bed linen change, pt moved her right arm and leg to assist in rolling, then when asked to hold limbs up she was not able to.

## 2020-01-11 NOTE — ED Triage Notes (Signed)
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 Pt last known well was 1800 w/ R sided weakness and R sided double vision. Initial NIH was a 7, after tPA it was a 6 and with [facility] it was a 5. Pt received a total of 90mg  tPA at[facility]. Pt A&Ox4

## 2020-01-11 NOTE — Consult Note (Addendum)
[facility] Tele[facility]urology Consult Services   Date of Service:   01/11/2020 20:25:26  Impression:     .  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 She has binocular diplopia. NIH 7. CT head unremarkable.   All contraindications reviewed, she meets criteria, she understands risks, benefits. She verbally consents to alteplase. D/w ER Dr Myrtis Ser the above as well.   Diff Dx: CVA, TIA, other.   Rec:  - weight based alteplase ordered, bolus in , no complications.  - post alteplase ICU admission, and CVA orders.  - CTA brain, neck ordered to r/o LVO.  - MRI brain.  - Neurology follow up.  - see other recs below.  d/w pt, and ER attending all of the above recs.  Metrics: Last Known Well: 01/11/2020 17:30:00 [facility] Notification Time: 01/11/2020 20:25:03 Arrival Time: 01/11/2020 20:06:00 Stamp Time: 01/11/2020 20:25:26 Time First Login Attempt: 01/11/2020 20:28:45 Symptoms: R sided weakness, numbness. NIHSS Start Assessment Time: 01/11/2020 20:35:45 Thrombolytic Early Mix Decision Time: 01/11/2020 20:39:45 Patient is a candidate for Thrombolytic. Thrombolytic Medical Decision: 01/11/2020 20:39:45 Thrombolytic CPOE Order Time: 01/11/2020 20:58:00 Needle Time: 01/11/2020 21:16:50 Weight Noted by Staff: 148.8 kg Reason for Thrombolytic Delay: Absence of IV access at the time of MDM  CT head showed no acute hemorrhage or acute core infarct. CT head was reviewed.  ED Physician notified of diagnostic impression and management plan on 01/11/2020 21:14:30  Advanced Imaging: CTA Head and Neck ordered   Thrombolytic Contraindications:  Last Known Well > 4.5 hours: No CT Head showing hemorrhage: No Ischemic stroke  within 3 months: No Severe head trauma within 3 months: No Intracranial/intraspinal surgery within 3 months: No History of intracranial hemorrhage: No Symptoms and signs consistent with an SAH: No GI malignancy or GI bleed within 21 days: No Coagulopathy: Platelets <100 000/mm3, INR >1.7, aPTT>40 s, or PT >15 s: No Treatment dose of LMWH within the previous 24 hrs: No Use of NOACs in past 48 hours: No Glycoprotein IIb/IIIa receptor inhibitors use: No Symptoms consistent with infective endocarditis: No Suspected aortic arch dissection: No Intra-axial intracranial neoplasm: No  Verbal Consent to Thrombolytic: I have explained to the Patient the nature of the patient's condition, reviewed the indications and contraindications to the use of Thrombolytic fibrinolytic agent, reviewed the indications and contraindications and the benefits to be reasonably expected compared with alternative approaches. I have discussed the likelihood of major risks or complications of this procedure including (if applicable) but not limited to loss of limb function, brain damage, paralysis, hemorrhage, infection, complications from transfusion of blood components, drug reactions, blood clots and loss of life. I have also indicated that with any procedure there is always the possibility of an unexpected complication. All questions were answered and the Patient expressed understanding of the treatment plan and consent to the treatment.  Our recommendations are outlined below.  Recommendations: IV Alteplase recommended.  Thrombolytic bolus given Without Complication.   IV Alteplase/Activase Total Dose - 90.0 mg IV Alteplase/Activase Bolus Dose - 9.0 mg IV Alteplase/Activase Infusion Dose - 81.0 mg   Routine post Thrombolytic monitoring including neuro checks and blood pressure control during/after treatment Monitor blood pressure Check blood pressure and neuro assessment every 15 min for 2 h, then every 30 min  for 6 h, and finally  every hour for 16 h.  Manage Blood Pressure per post Thrombolytic protocol.      .  Admission to ICU     .  CT brain 24 hours post Thrombolytic     .  NPO until swallowing screen performed and passed     .  No antiplatelet agents or anticoagulants (including heparin for DVT prophylaxis) in first 24 hours     .  No Foley catheter, nasogastric tube, arterial catheter or central venous catheter for 24 hr, unless absolutely necessary     .  Telemetry     .  Bedside swallow evaluation     .  HOB less than 30 degrees     .  Euglycemia     .  Avoid hyperthermia, PRN acetaminophen     .  DVT prophylaxis     .  Inpatient Neurology Consultation     .  Stroke evaluation as per inpatient neurology recommendations  Discussed with ED physician    ------------------------------------------------------------------------------  History of Present Illness: Patient is a 54 year old Female.  Patient was brought by private transportation with symptoms of R sided weakness, numbness.  54 yr old woman, with hx of HTN, DM, lipids, bipolar disorder per medical records, who reports sudden onset R sided numbness, arm, leg and weakness, starting after 5:30 pm today. She reports no feeling on R arm, R leg, and weakness. She has diplopia on exam, binocular. She had similar symptoms in may and july 2021, MRI showed no acute CVA, she denied any bleeding, no contraindications for alteplase when reviewed with her. It took long time to review all contraindications as patient needed more time to answer all the CI questions.   Past Medical History:     . Hypertension     . Diabetes Mellitus     . Hyperlipidemia  Social History: Smoking: No Alcohol Use: No Drug Use: No  Family History:HTN  Review of System:  14 Points Review of Systems was performed and was negative except mentioned in HPI.  Anticoagulant use:  No  Antiplatelet use: No   Examination: BP(142/91), Pulse(88), Blood  Glucose(147) 1A: Level of Consciousness - Alert; keenly responsive + 0 1B: Ask Month and Age - Both Questions Right + 0 1C: Blink Eyes & Squeeze Hands - Performs Both Tasks + 0 2: Test Horizontal Extraocular Movements - Normal + 0 3: Test Visual Fields - No Visual Loss + 0 4: Test Facial Palsy (Use Grimace if Obtunded) - Normal symmetry + 0 5A: Test Left Arm Motor Drift - No Drift for 10 Seconds + 0 5B: Test Right Arm Motor Drift - Some Effort Against Gravity + 2 6A: Test Left Leg Motor Drift - No Drift for 5 Seconds + 0 6B: Test Right Leg Motor Drift - No Effort Against Gravity + 3 7: Test Limb Ataxia (FNF/Heel-Shin) - No Ataxia + 0 8: Test Sensation - Complete Loss: Cannot Sense Being Touched At All + 2 9: Test Language/Aphasia - Normal; No aphasia + 0 10: Test Dysarthria - Normal + 0 11: Test Extinction/Inattention - No abnormality + 0  NIHSS Score: 7  Pre-Morbid Modified Rankin Scale: 0 Points = No symptoms at all   Patient/Family was informed the Neurology Consult would occur via TeleHealth consult by way of interactive audio and video telecommunications and consented to receiving care in this manner.   Patient is being evaluated for possible acute neurologic impairment and high probability of imminent or life-threatening deterioration. I  spent total of 35 minutes providing care to this patient, including time for face to face visit via telemedicine, review of medical records, imaging studies and discussion of findings with providers, the patient and/or family.   Dr Rubye Oaks   [facility] [phone]  Case [id number]  Advanced Imaging:  Advanced Imaging Was Recommended however:  Patient being Transferred to another Facility for Advanced Imaging.     CTA brain and neck was ordered, however IV access blew, and after multiple tries in ED, she could not have sustained IV access for CTA brain, neck. I spoke with ER and plan is for transfer to [facility] and to obtain  CTA brain, neck at that facility. If there is acute thrombus, then she would need thrombectomy. Patient is stable post completion of alteplase infusion. d/w ER and Stroke RN.

## 2020-01-11 NOTE — ED Notes (Signed)
Visual exam very inconsistent, states double vision only on right side. Then when visual fields assessed, pt reports blurred vision bilaterally. Continues to report that she cannot move right arm or leg and no sensation there. Pt continues to move right hand intermittently

## 2020-01-11 NOTE — ED Provider Notes (Signed)
[facility] EMERGENCY DEPARTMENT Provider Note   CSN: [id number] Arrival date & time: 01/11/20  2006     History Chief Complaint  Patient presents with  . Code Stroke    Monique Holland is a 54 y.o. female.   Cerebrovascular Accident This is a new problem. The current episode started 3 to 5 hours ago. The problem occurs constantly. The problem has not changed since onset.Pertinent negatives include no chest pain, no headaches and no shortness of breath. Nothing aggravates the symptoms. Nothing relieves the symptoms. She has tried nothing for the symptoms. The treatment provided no relief.       Past Medical History:  Diagnosis Date  . Anxiety   . Asthma   . Bipolar disorder (HCC)   . Depression   . Diabetes mellitus without complication (HCC)   . Frequency of urination   . GERD (gastroesophageal reflux disease)   . History of asthma    last episode yrs ago  . Hypertension   . OSA (obstructive sleep apnea)    pt had study done oct 2014--  schedule for cpap titrate after kidney stone surgery  . Pancreatitis   . Pseudocyst of pancreas   . Schizophrenia (HCC)   . Ureteral calculi    BILATERAL  . Wears glasses     Patient Active Problem List   Diagnosis Date Noted  . Acute bronchitis 11/13/2019  . Hyperglycemia due to diabetes mellitus (HCC) 11/13/2019  . DKA (diabetic ketoacidoses) (HCC) 11/13/2019  . Prolonged QT interval 10/17/2019  . Pancreatitis 10/16/2019  . Hyperlipidemia 10/01/2019  . OSA (obstructive sleep apnea) 10/01/2019  . GERD (gastroesophageal reflux disease) 10/01/2019  . Right sided weakness 09/29/2019  . Stroke-like episode s/p tPA 09/29/2019  . History of pancreatitis   . Intractable abdominal pain 09/15/2019  . Hyperosmolar hyperglycemic state (HHS) (HCC) 09/15/2019  . Non-intractable vomiting   . Hematemesis with nausea   . Abdominal pain 08/03/2019  . Essential hypertension 08/03/2019  . Pancreatic pseudocyst   . Nausea and  vomiting in adult   . Gastritis and gastroduodenitis   . Schizophrenia (HCC) 09/02/2018  . Obesity, Class III, BMI 40-49.9 (morbid obesity) (HCC) 09/02/2018  . Iron deficiency 09/02/2018  . Symptomatic anemia 09/01/2018  . Hypokalemia 09/01/2018  . AKI (acute kidney injury) (HCC) 08/11/2018  . Hypotension 08/11/2018  . Type 2 diabetes mellitus (HCC) 08/11/2018  . Ureteral calculus, right 05/02/2013    Past Surgical History:  Procedure Laterality Date  . BALLOON DILATION N/A 08/23/2019   Procedure: BALLOON DILATION;  Surgeon: Meridee Score Netty Starring., MD;  Location: [facility];  Service: Gastroenterology;  Laterality: N/A;  . BIOPSY  07/18/2019   Procedure: BIOPSY;  Surgeon: Shellia Cleverly, DO;  Location: [facility];  Service: Gastroenterology;;  . BIOPSY  08/23/2019   Procedure: BIOPSY;  Surgeon: Lemar Lofty., MD;  Location: [facility];  Service: Gastroenterology;;  . BIOPSY  09/18/2019   Procedure: BIOPSY;  Surgeon: Lemar Lofty., MD;  Location: [facility];  Service: Gastroenterology;;  . BIOPSY  10/18/2019   Procedure: BIOPSY;  Surgeon: Lemar Lofty., MD;  Location: [facility];  Service: Gastroenterology;;  . CESAREAN SECTION  1991  &  2002   w/ bilateral tubal ligation in 2002  . CYSTOSCOPY W/ URETERAL STENT PLACEMENT Right 08/10/2018   Procedure: CYSTOSCOPY WITH RETROGRADE PYELOGRAM/URETERAL STENT PLACEMENT;  Surgeon: Jerilee Field, MD;  Location: [facility];  Service: Urology;  Laterality: Right;  . CYSTOSCOPY WITH RETROGRADE PYELOGRAM, URETEROSCOPY AND STENT  PLACEMENT Bilateral 05/02/2013   Procedure: CYSTOSCOPY WITH RETROGRADE PYELOGRAM, URETEROSCOPY ;  Surgeon: Valetta Fulleravid S Grapey, MD;  [facility];  Service: Urology;  Laterality: Bilateral;  . CYSTOSCOPY WITH RETROGRADE PYELOGRAM, URETEROSCOPY AND STENT PLACEMENT Right 08/15/2018   Procedure: CYSTOSCOPY WITH RIGHT RETROGRADE PYELOGRAM, RIGHT URETEROSCOPY WITH HOLMIUM  LASER AND STENT PLACEMENT;  Surgeon: Crista ElliotBell, Eugene D III, MD;  Location: [facility];  Service: Urology;  Laterality: Right;  . CYSTOSCOPY WITH STENT PLACEMENT Left 05/02/2013   Procedure: CYSTOSCOPY WITH STENT PLACEMENT;  Surgeon: Valetta Fulleravid S Grapey, MD;  [facility];  Service: Urology;  Laterality: Left;  . ENDOROTOR N/A 08/26/2019   Procedure: ZOXWRUEAVENDOROTOR;  Surgeon: Mansouraty, Netty StarringGabriel Jr., MD;  [facility];  Service: Gastroenterology;  Laterality: N/A;  . ENDOROTOR  09/05/2019   Procedure: [id number];  Surgeon: Mansouraty, Netty StarringGabriel Jr., MD;  [facility];  Service: Gastroenterology;;  . ESOPHAGOGASTRODUODENOSCOPY N/A 08/26/2019   Procedure: ESOPHAGOGASTRODUODENOSCOPY (EGD) with NECROSECTOMY;  Surgeon: Lemar LoftyMansouraty, Gabriel Jr., MD;  [facility];  Service: Gastroenterology;  Laterality: N/A;  . ESOPHAGOGASTRODUODENOSCOPY N/A 10/18/2019   Procedure: ESOPHAGOGASTRODUODENOSCOPY (EGD);  Surgeon: Lemar LoftyMansouraty, Gabriel Jr., MD;  [facility];  Service: Gastroenterology;  Laterality: N/A;  . ESOPHAGOGASTRODUODENOSCOPY (EGD) WITH PROPOFOL N/A 07/18/2019   Procedure: ESOPHAGOGASTRODUODENOSCOPY (EGD) WITH PROPOFOL;  Surgeon: Shellia Cleverlyirigliano, Vito V, DO;  Location: [facility];  Service: Gastroenterology;  Laterality: N/A;  . ESOPHAGOGASTRODUODENOSCOPY (EGD) WITH PROPOFOL N/A 08/18/2019   Procedure: ESOPHAGOGASTRODUODENOSCOPY (EGD) WITH PROPOFOL;  Surgeon: Hilarie FredricksonPerry, John N, MD;  Location: [facility];  Service: Endoscopy;  Laterality: N/A;  . ESOPHAGOGASTRODUODENOSCOPY (EGD) WITH PROPOFOL N/A 08/21/2019   Procedure: ESOPHAGOGASTRODUODENOSCOPY (EGD) WITH PROPOFOL;  Surgeon: Meridee ScoreMansouraty, Netty StarringGabriel Jr., MD;  Location: [facility];  Service: Gastroenterology;  Laterality: N/A;  . ESOPHAGOGASTRODUODENOSCOPY (EGD) WITH PROPOFOL N/A 08/23/2019   Procedure: ESOPHAGOGASTRODUODENOSCOPY (EGD) WITH PROPOFOL;  Surgeon: Meridee ScoreMansouraty, Netty StarringGabriel Jr., MD;  Location: [facility];  Service:  Gastroenterology;  Laterality: N/A;  . ESOPHAGOGASTRODUODENOSCOPY (EGD) WITH PROPOFOL N/A 09/05/2019   Procedure: ESOPHAGOGASTRODUODENOSCOPY (EGD) WITH PROPOFOL;  Surgeon: Meridee ScoreMansouraty, Netty StarringGabriel Jr., MD;  [facility];  Service: Gastroenterology;  Laterality: N/A;   With pancreatic pseudocyst necrosectomy via established cyst gastrostomy  . ESOPHAGOGASTRODUODENOSCOPY (EGD) WITH PROPOFOL N/A 09/18/2019   Procedure: ESOPHAGOGASTRODUODENOSCOPY (EGD) WITH PROPOFOL;  Surgeon: Meridee ScoreMansouraty, Netty StarringGabriel Jr., MD;  [facility];  Service: Gastroenterology;  Laterality: N/A;  . EUS  08/23/2019   Procedure: UPPER ENDOSCOPIC ULTRASOUND (EUS) LINEAR;  Surgeon: Lemar LoftyMansouraty, Gabriel Jr., MD;  [facility];  Service: Gastroenterology;;  . FOREIGN BODY REMOVAL  09/05/2019   Procedure: FOREIGN BODY REMOVAL;  Surgeon: Lemar LoftyMansouraty, Gabriel Jr., MD;  [facility];  Service: Gastroenterology;;  . FOREIGN BODY REMOVAL  09/18/2019   Procedure: FOREIGN BODY REMOVAL;  Surgeon: Lemar LoftyMansouraty, Gabriel Jr., MD;  [facility];  Service: Gastroenterology;;  . HOLMIUM LASER APPLICATION Bilateral 05/02/2013   Procedure: HOLMIUM LASER APPLICATION;  Surgeon: Valetta Fulleravid S Grapey, MD;  [facility];  Service: Urology;  Laterality: Bilateral;  . LAPAROSCOPIC APPENDECTOMY N/A 05/22/2014   Procedure: APPENDECTOMY LAPAROSCOPIC;  Surgeon: Glenna FellowsBenjamin Hoxworth, MD;  Location: [facility];  Service: General;  Laterality: N/A;  . PANCREATIC STENT PLACEMENT  08/23/2019   Procedure: PANCREATIC STENT PLACEMENT;  Surgeon: Lemar LoftyMansouraty, Gabriel Jr., MD;  [facility];  Service: Gastroenterology;;  . PANCREATIC STENT PLACEMENT  09/05/2019   Procedure: PANCREATIC STENT PLACEMENT;  Surgeon: Lemar LoftyMansouraty, Gabriel Jr., MD;  [facility];  Service: Gastroenterology;;  . PANCREATIC STENT PLACEMENT  09/18/2019   Procedure: PANCREATIC STENT PLACEMENT;  Surgeon: Meridee Score Netty Starring., MD;  Location:  [facility];  Service: Gastroenterology;;  cyst gastrostomy double pigtail stent placement   . STENT REMOVAL  08/26/2019   Procedure: STENT REMOVAL;  Surgeon: Lemar Lofty., MD;  [facility];  Service: Gastroenterology;;  . Francine Graven REMOVAL  09/05/2019   Procedure: STENT REMOVAL;  Surgeon: Lemar Lofty., MD;  [facility];  Service: Gastroenterology;;  . Francine Graven REMOVAL  09/18/2019   Procedure: STENT REMOVAL;  Surgeon: Lemar Lofty., MD;  Location: [facility];  Service: Gastroenterology;;  . TONSILLECTOMY  age 64  . UPPER ESOPHAGEAL ENDOSCOPIC ULTRASOUND (EUS) N/A 08/21/2019   Procedure: UPPER ESOPHAGEAL ENDOSCOPIC ULTRASOUND (EUS);  Surgeon: Lemar Lofty., MD;  Location: [facility];  Service: Gastroenterology;  Laterality: N/A;  For cyst gastrostomy     OB History   No obstetric history on file.     Family History  Family history unknown: Yes    Social History   Tobacco Use  . Smoking status: Never Smoker  . Smokeless tobacco: Never Used  Vaping Use  . Vaping Use: Never used  Substance Use Topics  . Alcohol use: No  . Drug use: No    Home Medications Prior to Admission medications   Medication Sig Start Date End Date Taking? Authorizing Provider  albuterol (VENTOLIN HFA) 108 (90 Base) MCG/ACT inhaler Inhale 1-2 puffs into the lungs every 6 (six) hours as needed for wheezing or shortness of breath. 11/04/19   Rancour, Jeannett Senior, MD  amitriptyline (ELAVIL) 75 MG tablet Take 75 mg by mouth at bedtime. 08/10/19   [provider]  amLODipine (NORVASC) 5 MG tablet Take 5 mg by mouth daily.    [provider]  ARIPiprazole (ABILIFY) 2 MG tablet Take 2 mg by mouth daily. 10/22/19   [provider]  atorvastatin (LIPITOR) 40 MG tablet Take 40 mg by mouth daily.    [provider]  clonazePAM (KLONOPIN) 0.5 MG tablet Take 0.5 mg by mouth daily.  05/23/19   [provider]  diclofenac  (VOLTAREN) 75 MG EC tablet Take 75 mg by mouth 2 (two) times daily. 10/30/19   [provider]  JANUMET 50-1000 MG tablet Take 1 tablet by mouth 2 (two) times daily. 09/24/19   [provider]  lamoTRIgine (LAMICTAL) 200 MG tablet Take 200 mg by mouth daily. 10/30/19   [provider]  LANTUS SOLOSTAR 100 UNIT/ML Solostar Pen Inject 60 Units into the skin 2 (two) times daily. 11/17/19   Swayze, Ava, DO  LATUDA 80 MG TABS tablet Take 80 mg by mouth at bedtime. 11/06/19   [provider]  losartan (COZAAR) 25 MG tablet Take 25 mg by mouth daily.    [provider]  ondansetron (ZOFRAN ODT) 4 MG disintegrating tablet 4mg  ODT q4 hours prn nausea/vomit 12/15/19   12/17/19, DO  ondansetron (ZOFRAN) 8 MG tablet Take 1 tablet (8 mg total) by mouth every 8 (eight) hours as needed for nausea or vomiting. 11/17/19   Swayze, Ava, DO  Oxycodone HCl 10 MG TABS Take 10 mg by mouth every 4 (four) hours as needed (pain). Taking once daily as needed 08/12/19   [provider]  pantoprazole (PROTONIX) 40 MG tablet TAKE 1 TABLET (40 MG TOTAL) BY MOUTH 2 (TWO) TIMES DAILY FOR 28 DAYS. 12/31/19 01/28/20  Anyanwu, 03/29/20, MD  promethazine (PHENERGAN) 6.25 MG/5ML syrup Take 5 mLs by mouth 2 (two) times daily  as needed for nausea/vomiting. 11/08/19   [provider]  Promethazine-Codeine 6.25-10 MG/5ML SOLN Take 5 mLs by mouth every 4 (four) hours as needed for cough. 10/30/19   [provider]  propranolol (INDERAL) 20 MG tablet Take 20 mg by mouth 2 (two) times daily.    [provider]  risperiDONE (RISPERDAL) 0.5 MG tablet Take 0.5 mg by mouth at bedtime.  09/22/19   [provider]  traZODone (DESYREL) 100 MG tablet Take 100 mg by mouth at bedtime.    [provider]  triamcinolone cream (KENALOG) 0.1 % Apply 1 application topically 3 (three) times daily. 11/26/19   [provider]    Allergies    Patient has no known  allergies.  Review of Systems   Review of Systems  Constitutional: Negative for chills and fever.  HENT: Negative for congestion and rhinorrhea.   Respiratory: Negative for cough and shortness of breath.   Cardiovascular: Negative for chest pain and palpitations.  Gastrointestinal: Negative for diarrhea, nausea and vomiting.  Genitourinary: Negative for difficulty urinating and dysuria.  Musculoskeletal: Negative for arthralgias and back pain.  Skin: Negative for rash and wound.  Neurological: Positive for weakness and numbness. Negative for light-headedness and headaches.    Physical Exam Updated Vital Signs Pulse 93   Resp 20   LMP 03/23/2014 Comment: (-) u preg?/ac  SpO2 99%   Physical Exam Vitals and nursing note reviewed. Exam conducted with a chaperone present.  Constitutional:      General: She is not in acute distress.    Appearance: Normal appearance.  HENT:     Head: Normocephalic and atraumatic.     Nose: No rhinorrhea.  Eyes:     General:        Right eye: No discharge.        Left eye: No discharge.     Conjunctiva/sclera: Conjunctivae normal.  Cardiovascular:     Rate and Rhythm: Normal rate and regular rhythm.  Pulmonary:     Effort: Pulmonary effort is normal. No respiratory distress.     Breath sounds: No stridor.  Abdominal:     General: Abdomen is flat. There is no distension.     Palpations: Abdomen is soft.  Musculoskeletal:        General: No tenderness or signs of injury.  Skin:    General: Skin is warm and dry.  Neurological:     Mental Status: She is alert and oriented to person, place, and time.     Motor: Weakness present.     Comments: No facial droop noted.  Symmetric smile, grip strength nothing in the right upper extremity cannot move the right lower extremity on mild muscle engagement, full strength in the left upper lower extremity, reported decreased sensation, in the right.  Unable to ambulate patient, patient is able to speak  clearly, patient is able to identify a pen  Psychiatric:        Mood and Affect: Mood normal.        Behavior: Behavior normal.     ED Results / Procedures / Treatments   Labs (all labs ordered are listed, but only abnormal results are displayed) Labs Reviewed  ETHANOL  PROTIME-INR  APTT  CBC  DIFFERENTIAL  COMPREHENSIVE METABOLIC PANEL  RAPID URINE DRUG SCREEN, HOSP PERFORMED  URINALYSIS, ROUTINE W REFLEX MICROSCOPIC  PREGNANCY, URINE    EKG None  Radiology No results found.  Procedures .Critical Care Performed by: Sabino Donovan, MD Authorized by:  Sabino Donovan, MD   Critical care provider statement:    Critical care time (minutes):  45   Critical care was time spent personally by me on the following activities:  Discussions with consultants, evaluation of patient's response to treatment, examination of patient, ordering and performing treatments and interventions, ordering and review of laboratory studies, ordering and review of radiographic studies, pulse oximetry, re-evaluation of patient's condition, obtaining history from patient or surrogate, review of old charts and development of treatment plan with patient or surrogate   (including critical care time)  Medications Ordered in ED Medications - No data to display  ED Course  I have reviewed the triage vital signs and the nursing notes.  Pertinent labs & imaging results that were available during my care of the patient were reviewed by me and considered in my medical decision making (see chart for details).    MDM Rules/Calculators/A&P                          54 year old female comes in with 2 to 3 hours sudden onset right-sided weakness and numbness.  Code stroke was called from triage.  Glucose is acceptable.  EKG shows sinus rhythm without acute ischemic change interval abnormality or arrhythmia.  She has had similar presentation with negative work-up in the past.  However the neurologist evaluated the  patient and feels that she is high risk for missing acute intervention with our timeframe.  So she opts for TPA administration.  Patient's blood pressure stable and was no contraindication at this time.  Is administered.  We had difficulty getting IV access.  She had painful response to IV access on the left but none whatsoever on the right.  Eventually I had to place an external jugular IV catheter on the right.  And eventually was able to get ultrasound-guided IV access on the left antecubital fossa.  TPA bolus was given and patient was sent for CTA.  CT without contrast was evaluated by the neuroradiologist with no acute findings.  I was able to view the CT without contrast as well and agree there is no acute findings for hemorrhage or other significant abnormality.  The plan will be if CTA does not identify any large occlusion she will go to the neuro ICU, if there is a large occlusion visible patient will go for thrombectomy.  Patient is hemodynamically stable CTA was done however the contrast bolus infiltrated into her arm.  She will need transfer to the [facility] emergency department for emergency department boarding while she waits for an ICU bed, no signs of complication from TPA prior to transfer.  Final Clinical Impression(s) / ED Diagnoses Final diagnoses:  None    Rx / DC Orders ED Discharge Orders    None       Sabino Donovan, MD 01/11/20 2247

## 2020-01-11 NOTE — ED Triage Notes (Signed)
Right sided weakness and numbness. EDP in triage. States she can cannot move her right side but this RN has seen her do it in triage.

## 2020-01-11 NOTE — ED Notes (Signed)
Plan for tPA, difficulty getting access at this time. Still unable to get blood

## 2020-01-12 ENCOUNTER — Inpatient Hospital Stay (HOSPITAL_COMMUNITY): Payer: Medicare Other

## 2020-01-12 ENCOUNTER — Emergency Department (HOSPITAL_COMMUNITY): Payer: Medicare Other

## 2020-01-12 ENCOUNTER — Observation Stay (HOSPITAL_COMMUNITY): Payer: Medicare Other

## 2020-01-12 DIAGNOSIS — Z8249 Family history of ischemic heart disease and other diseases of the circulatory system: Secondary | ICD-10-CM | POA: Diagnosis not present

## 2020-01-12 DIAGNOSIS — R200 Anesthesia of skin: Secondary | ICD-10-CM | POA: Diagnosis not present

## 2020-01-12 DIAGNOSIS — I10 Essential (primary) hypertension: Secondary | ICD-10-CM | POA: Diagnosis present

## 2020-01-12 DIAGNOSIS — F449 Dissociative and conversion disorder, unspecified: Secondary | ICD-10-CM | POA: Diagnosis present

## 2020-01-12 DIAGNOSIS — Z20822 Contact with and (suspected) exposure to covid-19: Secondary | ICD-10-CM | POA: Diagnosis present

## 2020-01-12 DIAGNOSIS — R531 Weakness: Secondary | ICD-10-CM | POA: Diagnosis present

## 2020-01-12 DIAGNOSIS — F209 Schizophrenia, unspecified: Secondary | ICD-10-CM | POA: Diagnosis present

## 2020-01-12 DIAGNOSIS — Z8673 Personal history of transient ischemic attack (TIA), and cerebral infarction without residual deficits: Secondary | ICD-10-CM | POA: Diagnosis not present

## 2020-01-12 DIAGNOSIS — E7800 Pure hypercholesterolemia, unspecified: Secondary | ICD-10-CM | POA: Diagnosis not present

## 2020-01-12 DIAGNOSIS — F319 Bipolar disorder, unspecified: Secondary | ICD-10-CM | POA: Diagnosis present

## 2020-01-12 DIAGNOSIS — Z9282 Status post administration of tPA (rtPA) in a different facility within the last 24 hours prior to admission to current facility: Secondary | ICD-10-CM

## 2020-01-12 DIAGNOSIS — H532 Diplopia: Secondary | ICD-10-CM | POA: Diagnosis present

## 2020-01-12 DIAGNOSIS — J45909 Unspecified asthma, uncomplicated: Secondary | ICD-10-CM | POA: Diagnosis present

## 2020-01-12 DIAGNOSIS — E785 Hyperlipidemia, unspecified: Secondary | ICD-10-CM | POA: Diagnosis present

## 2020-01-12 DIAGNOSIS — Z87442 Personal history of urinary calculi: Secondary | ICD-10-CM | POA: Diagnosis not present

## 2020-01-12 DIAGNOSIS — Z6841 Body Mass Index (BMI) 40.0 and over, adult: Secondary | ICD-10-CM | POA: Diagnosis not present

## 2020-01-12 DIAGNOSIS — G4733 Obstructive sleep apnea (adult) (pediatric): Secondary | ICD-10-CM | POA: Diagnosis present

## 2020-01-12 DIAGNOSIS — E1165 Type 2 diabetes mellitus with hyperglycemia: Secondary | ICD-10-CM | POA: Diagnosis present

## 2020-01-12 DIAGNOSIS — Z79899 Other long term (current) drug therapy: Secondary | ICD-10-CM | POA: Diagnosis not present

## 2020-01-12 LAB — GLUCOSE, CAPILLARY
Glucose-Capillary: 144 mg/dL — ABNORMAL HIGH (ref 70–99)
Glucose-Capillary: 208 mg/dL — ABNORMAL HIGH (ref 70–99)
Glucose-Capillary: 206 mg/dL — ABNORMAL HIGH (ref 70–99)

## 2020-01-12 LAB — LIPID PANEL
Cholesterol: 161 mg/dL (ref 0–200)
HDL: 69 mg/dL (ref >40)
LDL Cholesterol: 76 mg/dL (ref 0–99)
Total CHOL/HDL Ratio: 2.3 RATIO
Triglycerides: 82 mg/dL (ref <150)
VLDL: 16 mg/dL (ref 0–40)

## 2020-01-12 LAB — MRSA PCR SCREENING: MRSA by PCR: NEGATIVE

## 2020-01-12 LAB — HEMOGLOBIN A1C
Hgb A1c MFr Bld: 8.7 % — ABNORMAL HIGH (ref 4.8–5.6)
Mean Plasma Glucose: 202.99 mg/dL

## 2020-01-12 IMAGING — CT CT ANGIO HEAD-NECK
2 of 7 series · 8 of 33 positions shown · IV contrast (omnipaque)
Comparison: CTA head and neck [DATE]

CLINICAL DATA: Right-sided weakness and numbness

EXAM:
CT ANGIOGRAPHY HEAD AND NECK
TECHNIQUE: Multidetector CT imaging of the head and neck was performed using
the standard protocol during bolus administration of intravenous
contrast. Multiplanar CT image reconstructions and MIPs were
obtained to evaluate the vascular anatomy. Carotid stenosis
measurements (when applicable) are obtained utilizing NASCET
criteria, using the distal internal carotid diameter as the
denominator.
CONTRAST:  100mL OMNIPAQUE IOHEXOL 350 MG/ML SOLN

[Series 6: cta neck/head · axial · 0.61mm/px · z∈[+865,+998]mm · 2 of 204 slices shown]
[im 68/204  soft-tissue]
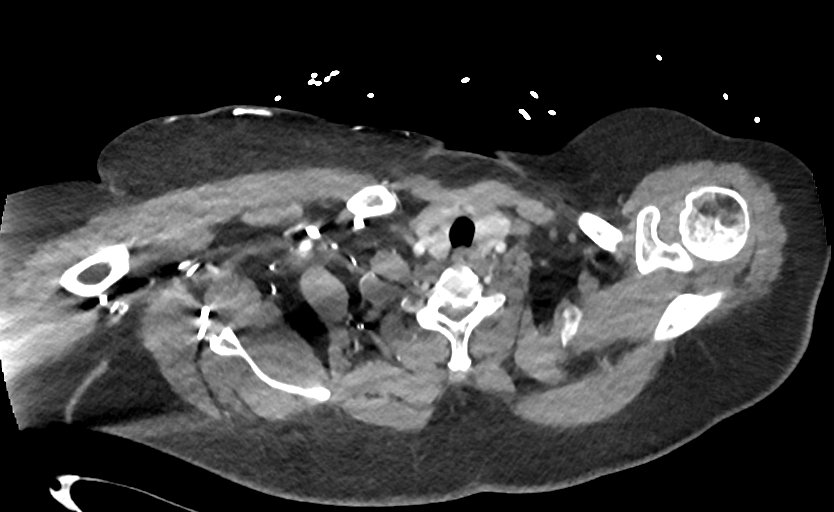
[im 136/204  bone]
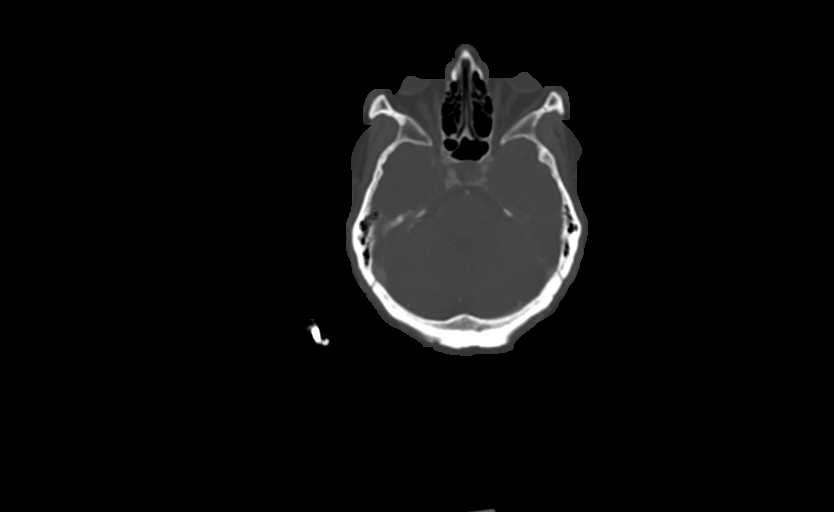

[Series 8: ax thins · axial · 0.54mm/px · z∈[+874,+1134]mm · 6 of 365 slices shown]
[im 53/365  soft-tissue]
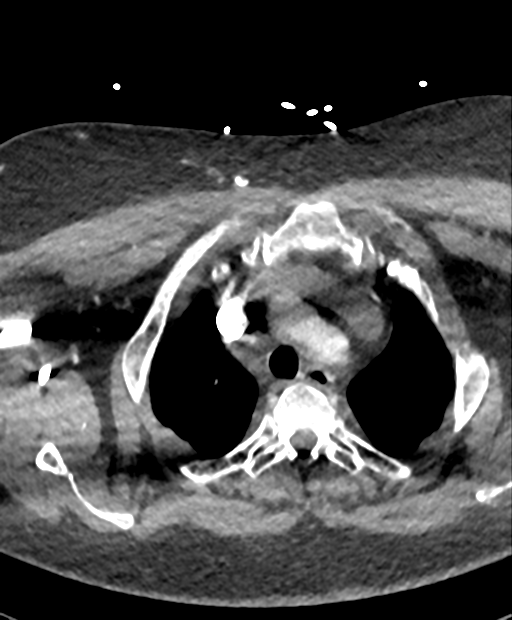
[im 105/365  soft-tissue]
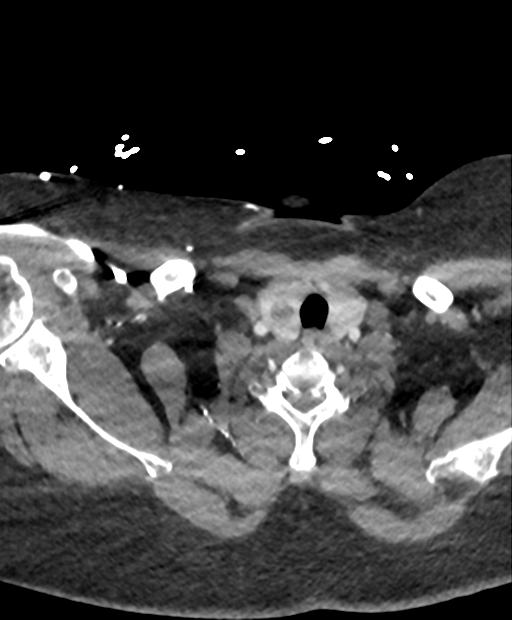
[im 157/365  soft-tissue]
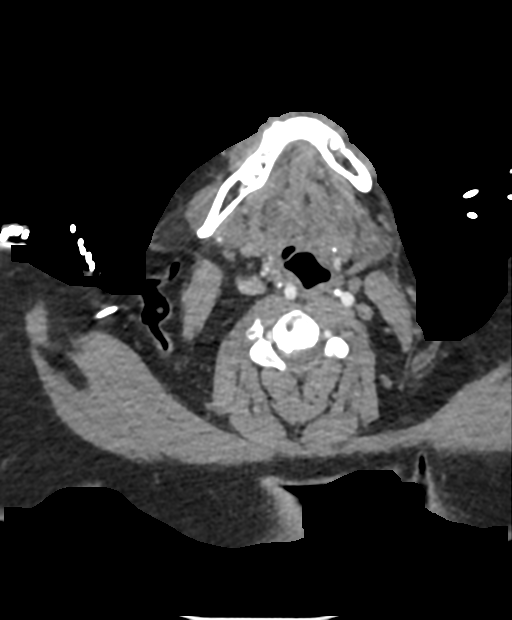
[im 209/365  soft-tissue]
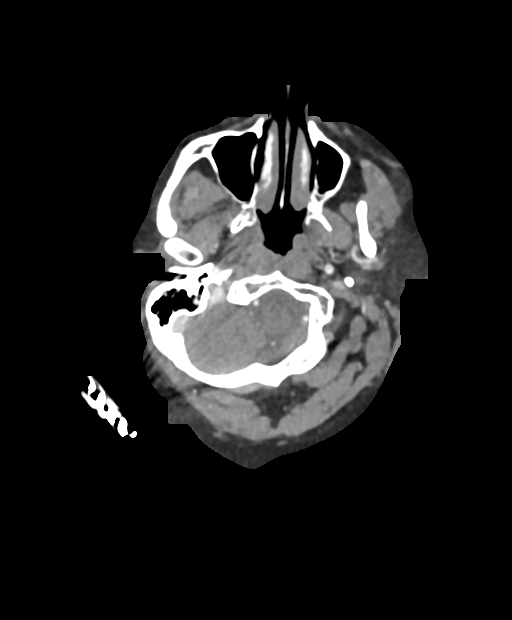
[im 261/365  soft-tissue]
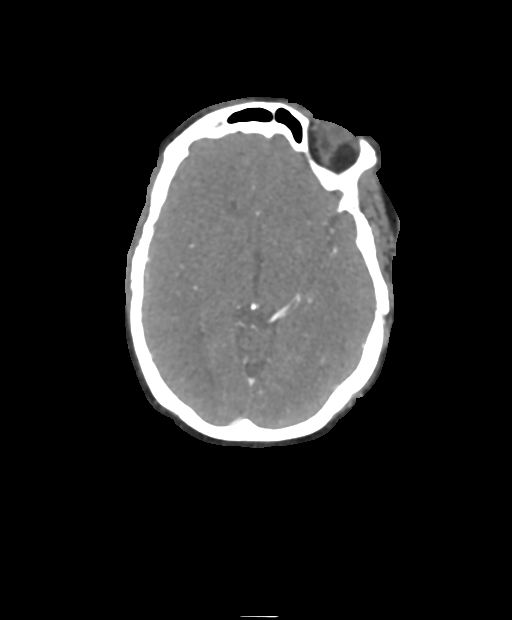
[im 313/365  soft-tissue]
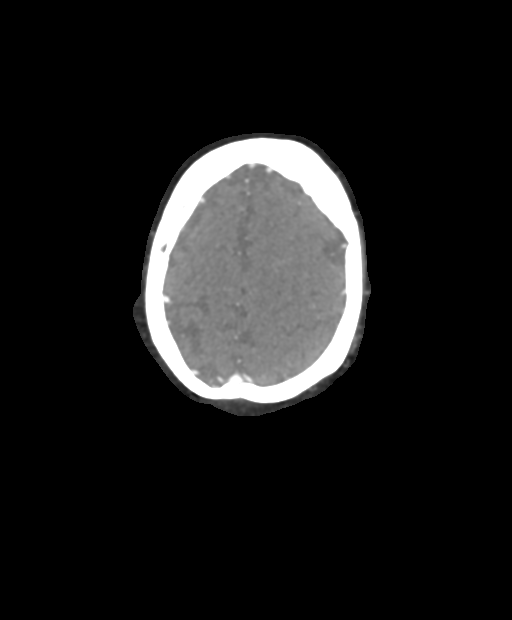

[8 of 33 positions shown; findings below may reference images not displayed]

FINDINGS: CTA NECK FINDINGS

SKELETON: There is no bony spinal canal stenosis. No lytic or
blastic lesion.

OTHER NECK: Normal pharynx, larynx and major salivary glands. No
cervical lymphadenopathy. Unremarkable thyroid gland.

UPPER CHEST: No pneumothorax or pleural effusion. No nodules or
masses.

AORTIC ARCH:

There is no calcific atherosclerosis of the aortic arch. There is no
aneurysm, dissection or hemodynamically significant stenosis of the
visualized portion of the aorta. Conventional 3 vessel aortic
branching pattern. The visualized proximal subclavian arteries are
widely patent.

RIGHT CAROTID SYSTEM: Normal without aneurysm, dissection or
stenosis.

LEFT CAROTID SYSTEM: Normal without aneurysm, dissection or
stenosis.

VERTEBRAL ARTERIES: Left dominant configuration. Both origins are
clearly patent. There is no dissection, occlusion or flow-limiting
stenosis to the skull base (V1-V3 segments).

CTA HEAD FINDINGS

POSTERIOR CIRCULATION:

--Vertebral arteries: Normal V4 segments.

--Inferior cerebellar arteries: Normal.

--Basilar artery: Normal.

--Superior cerebellar arteries: Normal.

--Posterior cerebral arteries (PCA): Normal.

ANTERIOR CIRCULATION:

--Intracranial internal carotid arteries: Normal.

--Anterior cerebral arteries (ACA): Normal. Both A1 segments are
present. Patent anterior communicating artery (a-comm).

--Middle cerebral arteries (MCA): Normal.

VENOUS SINUSES: As permitted by contrast timing, patent.

ANATOMIC VARIANTS: None

Review of the MIP images confirms the above findings.
IMPRESSION: Normal CTA of the head and neck.

## 2020-01-12 IMAGING — MR MR HEAD W/O CM
12 of 13 series · 44 of 48 positions shown · non-contrast
Comparison: [DATE]

CLINICAL DATA: Right-sided weakness and numbness

EXAM:
MRI HEAD WITHOUT CONTRAST
TECHNIQUE: Multiplanar, multiecho pulse sequences of the brain and surrounding
structures were obtained without intravenous contrast.

[Series 6: DWI · axial · 3.0mm · 0.88mm/px · z∈[-98,+54]mm · 9 of 104 slices shown (1 of 4)]
[im 1/104]
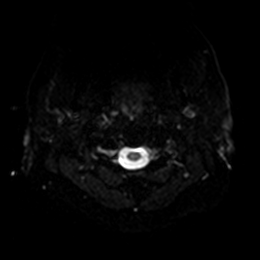
[im 13/104]
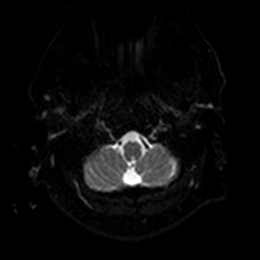
[im 26/104]
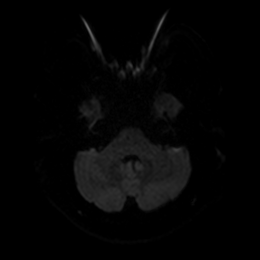
[im 39/104]
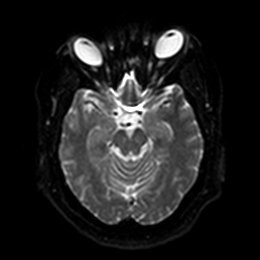
[im 52/104]
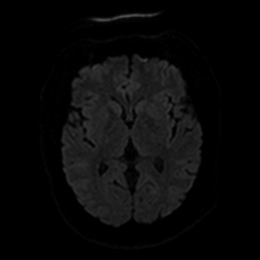
[im 65/104]
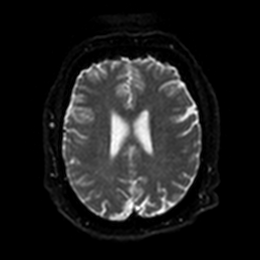
[im 78/104]
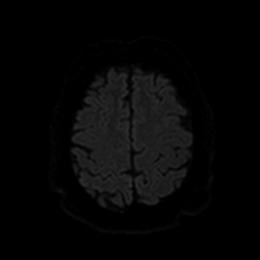
[im 91/104]
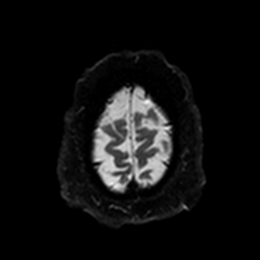
[im 104/104]
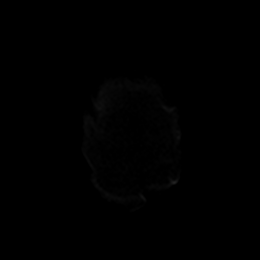

[Series 7: DWI · axial · 3.0mm · 0.88mm/px · z∈[-98,+54]mm · 4 of 52 slices shown (2 of 4)]
[im 1/52]
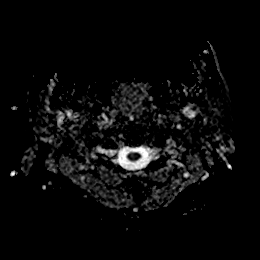
[im 18/52]
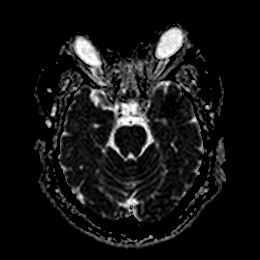
[im 35/52]
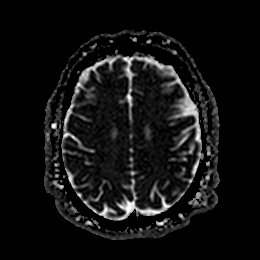
[im 52/52]
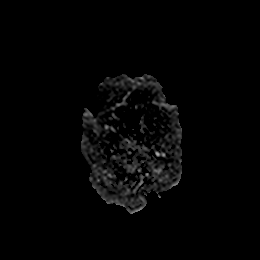

[Series 8: DWI · coronal · 4.0mm · 0.88mm/px · 5 of 66 slices shown (3 of 4)]
[im 1/66]
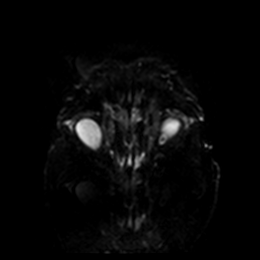
[im 17/66]
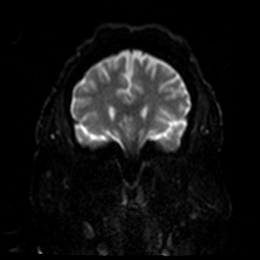
[im 33/66]
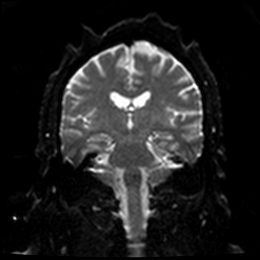
[im 49/66]
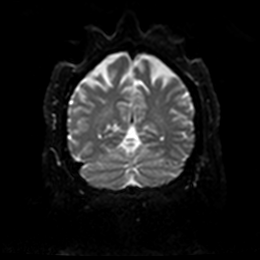
[im 66/66]
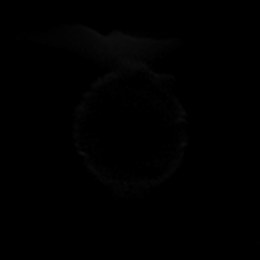

[Series 9: DWI · coronal · 4.0mm · 0.88mm/px · 2 of 33 slices shown (4 of 4)]
[im 1/33]
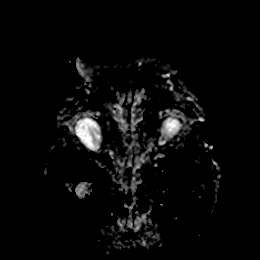
[im 33/33]
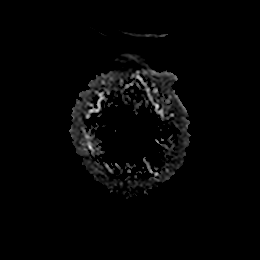

[Series 10: T1 · sagittal · 5.0mm · 0.75mm/px · 2 of 25 slices shown]
[im 1/25]
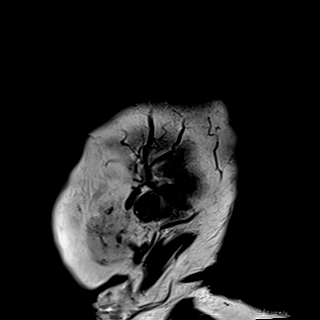
[im 25/25]
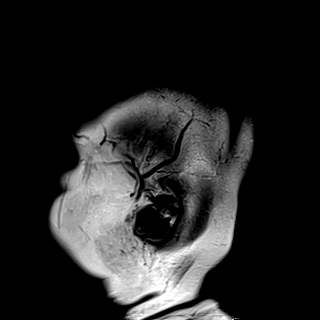

[Series 11: T2 · axial · 5.0mm · 0.72mm/px · z∈[-102,+54]mm · 2 of 27 slices shown (1 of 2)]
[im 1/27]
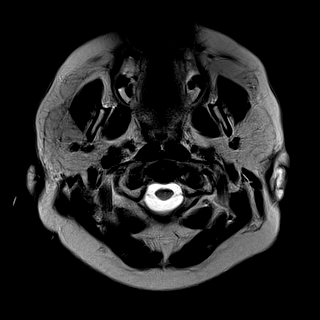
[im 27/27]
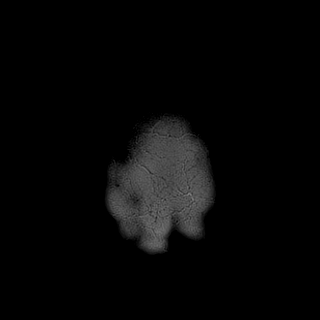

[Series 12: FLAIR · axial · 5.0mm · 0.90mm/px · z∈[-102,+54]mm · 2 of 27 slices shown]
[im 1/27]
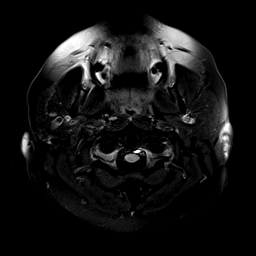
[im 27/27]
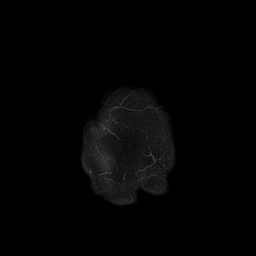

[Series 13: mag_images · axial · 3.0mm · 0.90mm/px · z∈[-106,+59]mm · 4 of 56 slices shown]
[im 1/56]
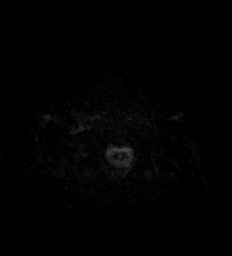
[im 19/56]
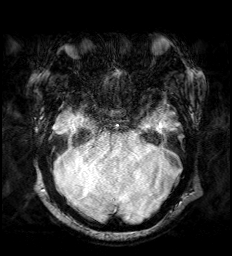
[im 37/56]
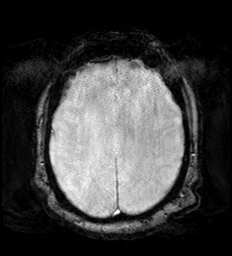
[im 56/56]
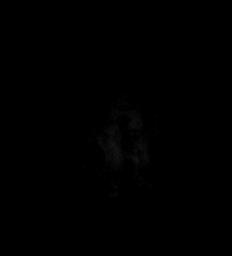

[Series 14: pha_images · axial · 3.0mm · 0.90mm/px · z∈[-106,+59]mm · 4 of 56 slices shown]
[im 1/56]
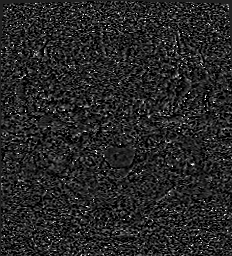
[im 19/56]
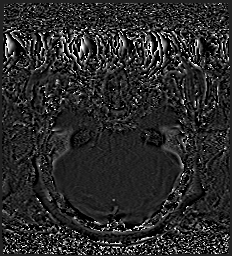
[im 37/56]
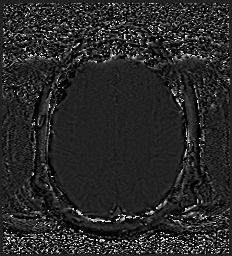
[im 56/56]
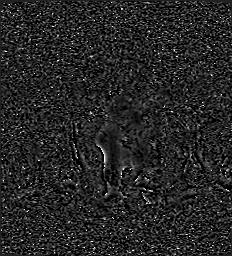

[Series 15: swi_images · axial · 3.0mm · 0.90mm/px · z∈[-106,+59]mm · 4 of 56 slices shown]
[im 1/56]
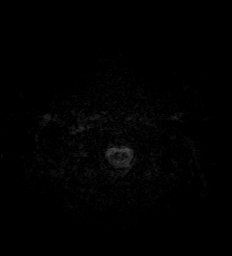
[im 19/56]
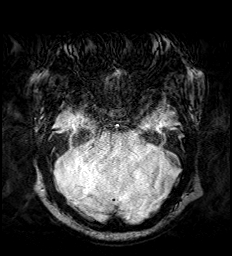
[im 37/56]
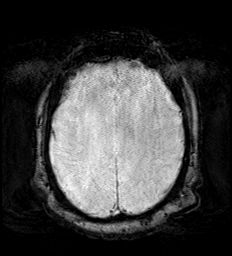
[im 56/56]
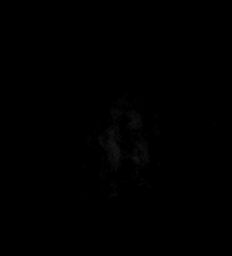

[Series 16: mip_images(sw) · axial · 24.0mm · 0.90mm/px · z∈[-95,+48]mm · 4 of 49 slices shown]
[im 1/49]
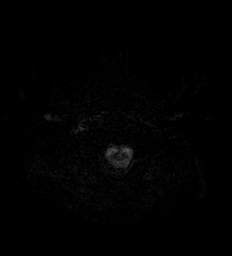
[im 17/49]
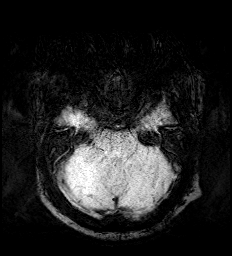
[im 33/49]
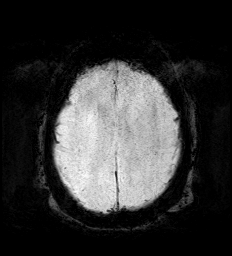
[im 49/49]
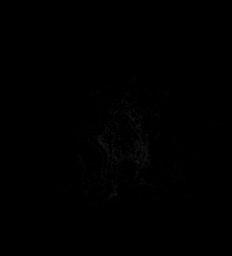

[Series 19: T2 · coronal · 5.0mm · 0.72mm/px · 2 of 31 slices shown (2 of 2)]
[im 1/31]
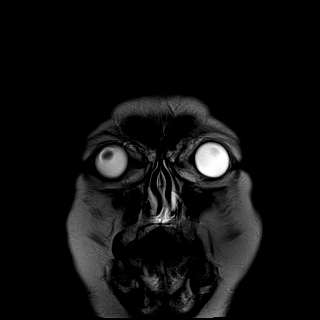
[im 31/31]
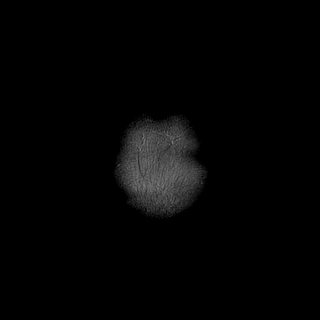

[44 of 48 positions shown; findings below may reference images not displayed]

FINDINGS: Brain: No acute infarct, acute hemorrhage or extra-axial collection.
Normal white matter signal. Normal volume of CSF spaces. No chronic
microhemorrhage. Normal midline structures.

Vascular: Normal flow voids.

Skull and upper cervical spine: Normal marrow signal.

Sinuses/Orbits: Negative.

Other: None.
IMPRESSION: Normal MRI of the brain.

## 2020-01-12 IMAGING — CT CT HEAD W/O CM
3 series · 14 of 47 positions shown, 16 images · non-contrast
Comparison: None.

CLINICAL DATA: 24 hours after tPA administration

EXAM:
CT HEAD WITHOUT CONTRAST
TECHNIQUE: Contiguous axial images were obtained from the base of the skull
through the vertex without intravenous contrast.

[Series 3: head 5.0 h30s · axial · 0.47mm/px · z∈[+1286,+1441]mm · 8 of 37 slices shown, 10 images]
[im 3/37  brain]
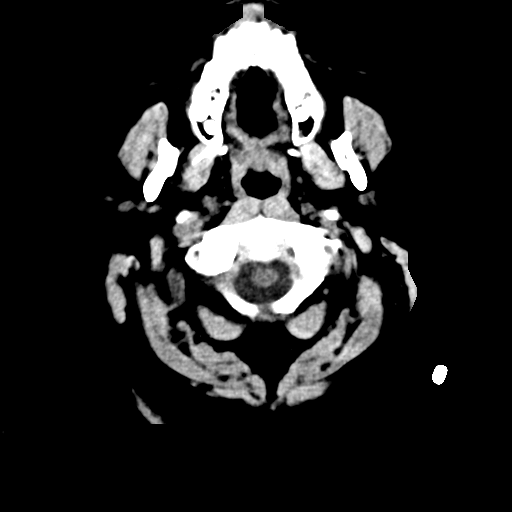
[im 3/37  bone]
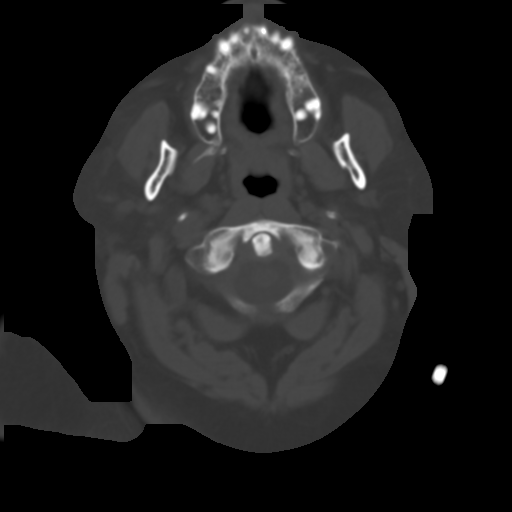
[im 8/37  brain]
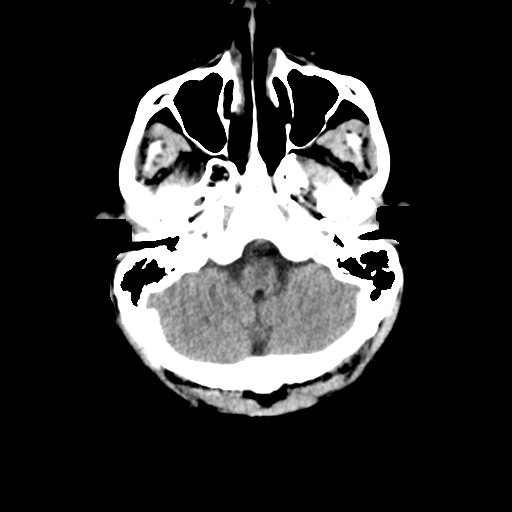
[im 12/37  brain]
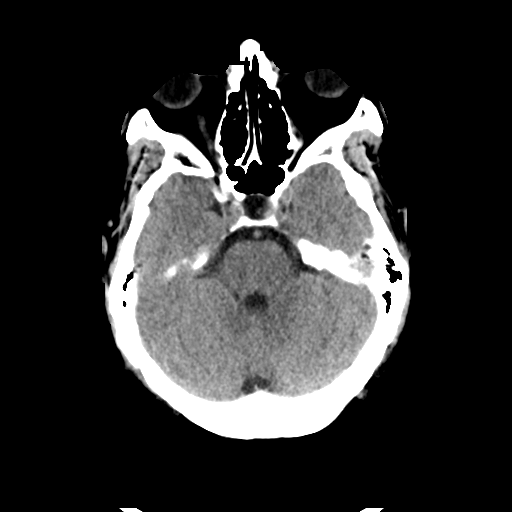
[im 17/37  brain]
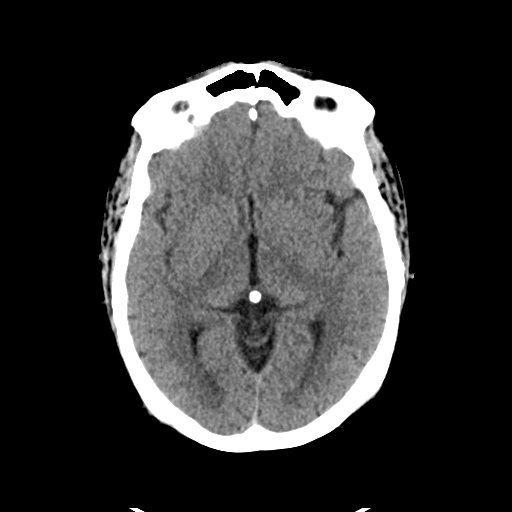
[im 20/37  brain]
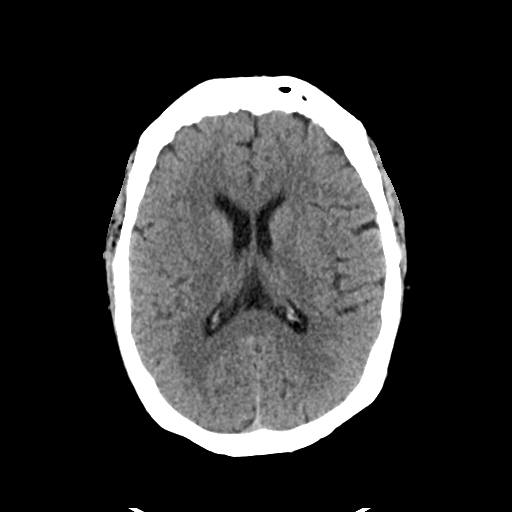
[im 20/37  bone]
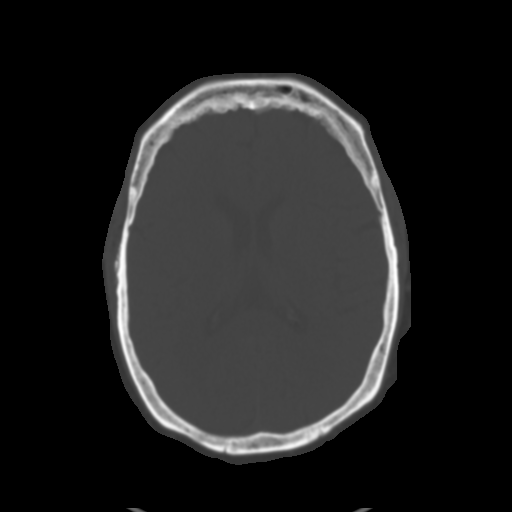
[im 25/37  brain]
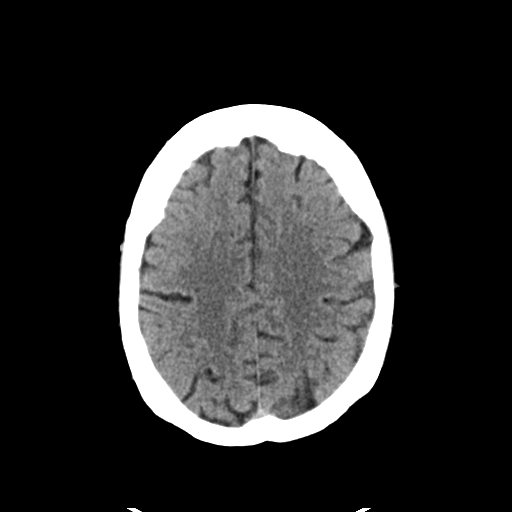
[im 29/37  brain]
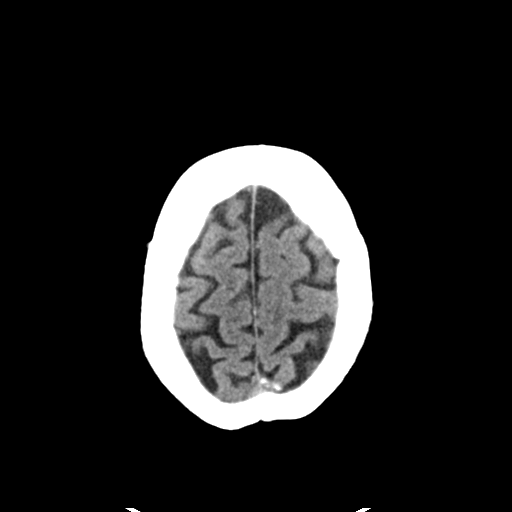
[im 34/37  brain]
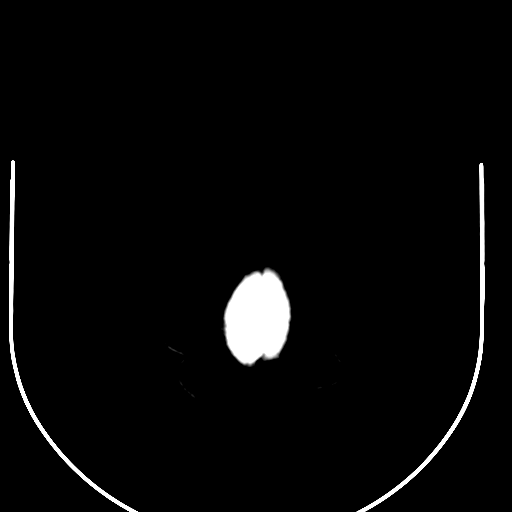

[Series 5: head 3.0 mpr cor · coronal · 0.36mm/px · 3 of 73 slices shown]
[im 25/73  brain]
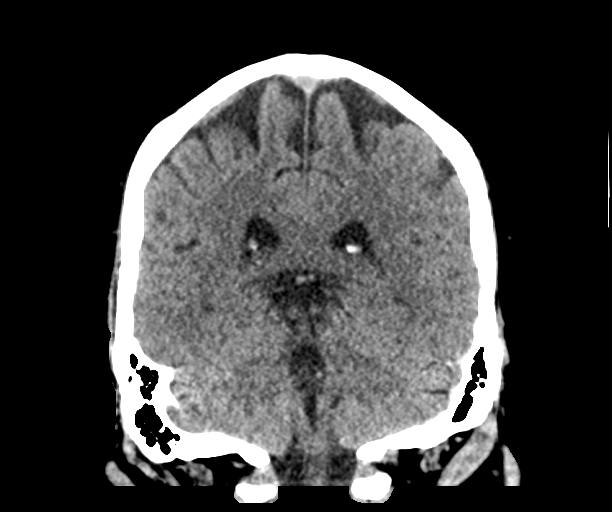
[im 33/73  brain]
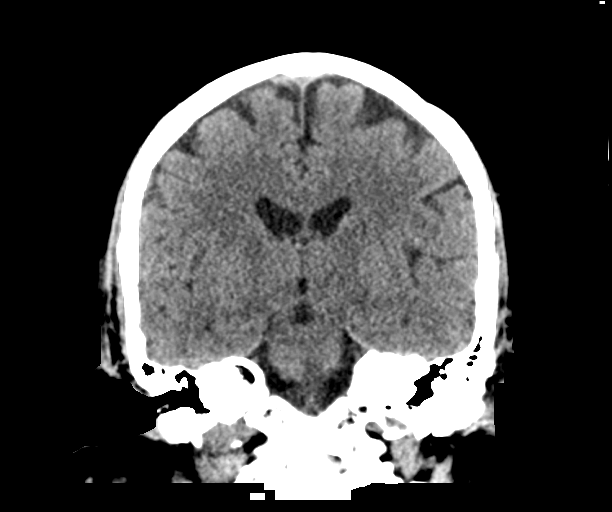
[im 41/73  brain]
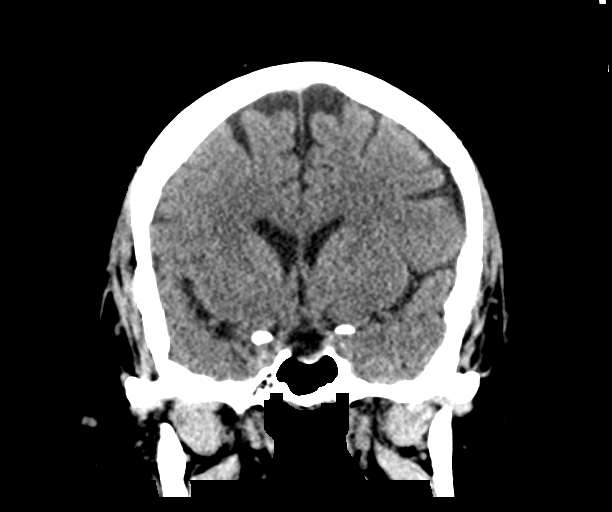

[Series 6: head 3.0 mpr sag · sagittal · 0.36mm/px · 3 of 67 slices shown]
[im 23/67  brain]
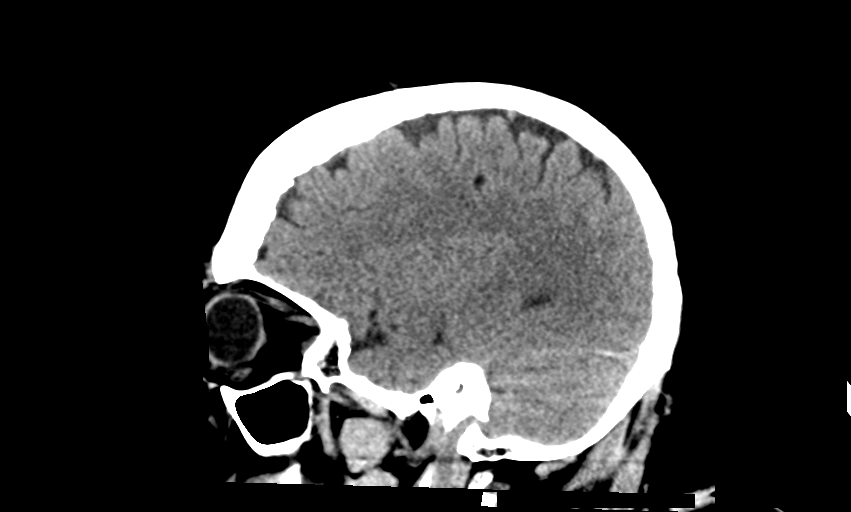
[im 34/67  brain]
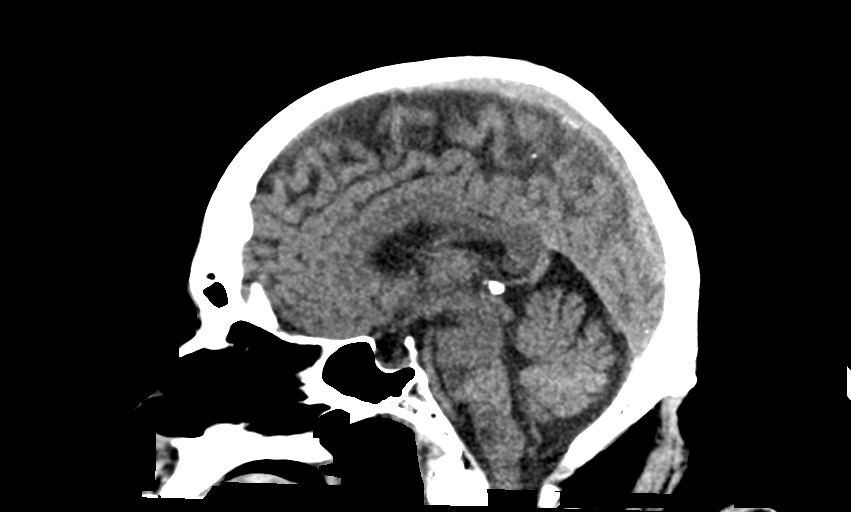
[im 45/67  brain]
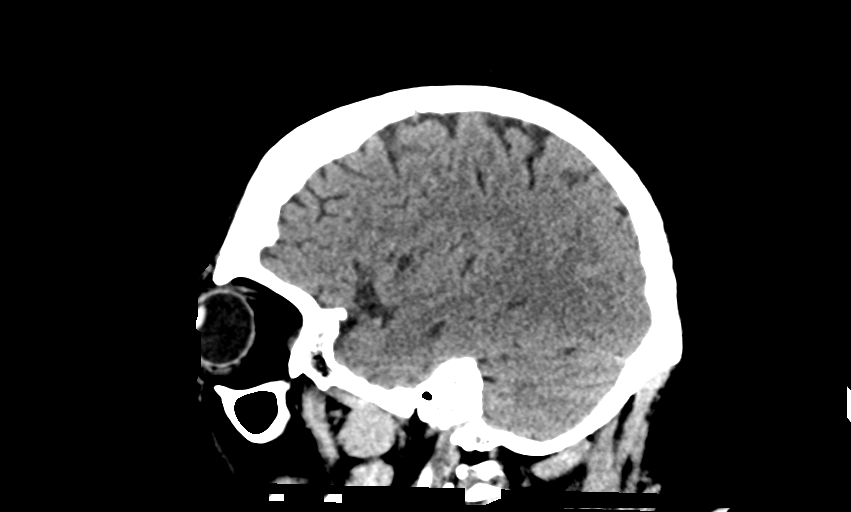

[14 of 47 positions shown; findings below may reference images not displayed]

FINDINGS: Brain: There is no mass, hemorrhage or extra-axial collection. The
size and configuration of the ventricles and extra-axial CSF spaces
are normal. The brain parenchyma is normal, without acute or chronic
infarction.

Vascular: No abnormal hyperdensity of the major intracranial
arteries or dural venous sinuses. No intracranial atherosclerosis.

Skull: The visualized skull base, calvarium and extracranial soft
tissues are normal.

Sinuses/Orbits: No fluid levels or advanced mucosal thickening of
the visualized paranasal sinuses. No mastoid or middle ear effusion.
The orbits are normal.
IMPRESSION: Normal head CT.

## 2020-01-12 MED ORDER — LISINOPRIL 20 MG PO TABS
20.0000 mg | ORAL_TABLET | Freq: Two times a day (BID) | ORAL | Status: DC
  Filled 2020-01-12: qty 1

## 2020-01-12 MED ORDER — NICARDIPINE HCL IN NACL 20-0.86 MG/200ML-% IV SOLN: 0.0000 mg/h | INTRAVENOUS | Status: DC

## 2020-01-12 MED ORDER — METFORMIN HCL 500 MG PO TABS
1000.0000 mg | ORAL_TABLET | Freq: Two times a day (BID) | ORAL | Status: DC
  Administered 2020-01-12 – 2020-01-15 (×6): 1000 mg via ORAL
  Filled 2020-01-12 (×6): qty 2

## 2020-01-12 MED ORDER — SODIUM CHLORIDE 0.9 % IV SOLN: INTRAVENOUS | Status: DC

## 2020-01-12 MED ORDER — INSULIN ASPART 100 UNIT/ML ~~LOC~~ SOLN
0.0000 [IU] | Freq: Every day | SUBCUTANEOUS | Status: DC
  Administered 2020-01-12: 2 [IU] via SUBCUTANEOUS

## 2020-01-12 MED ORDER — LISINOPRIL-HYDROCHLOROTHIAZIDE 20-12.5 MG PO TABS: 2.0000 | ORAL_TABLET | Freq: Every day | ORAL | Status: DC

## 2020-01-12 MED ORDER — LAMOTRIGINE 100 MG PO TABS
200.0000 mg | ORAL_TABLET | Freq: Every day | ORAL | Status: DC
  Administered 2020-01-12 – 2020-01-15 (×4): 200 mg via ORAL
  Filled 2020-01-12 (×4): qty 2

## 2020-01-12 MED ORDER — MELATONIN 3 MG PO TABS
3.0000 mg | ORAL_TABLET | Freq: Once | ORAL | Status: DC
  Filled 2020-01-12: qty 1

## 2020-01-12 MED ORDER — ACETAMINOPHEN 160 MG/5ML PO SOLN: 650.0000 mg | ORAL | Status: DC | PRN

## 2020-01-12 MED ORDER — TRAZODONE HCL 50 MG PO TABS
100.0000 mg | ORAL_TABLET | Freq: Every day | ORAL | Status: DC
  Administered 2020-01-12 – 2020-01-14 (×3): 100 mg via ORAL
  Filled 2020-01-12 (×3): qty 2

## 2020-01-12 MED ORDER — LISINOPRIL 20 MG PO TABS
20.0000 mg | ORAL_TABLET | Freq: Every day | ORAL | Status: DC
  Administered 2020-01-12: 20 mg via ORAL
  Filled 2020-01-12: qty 1

## 2020-01-12 MED ORDER — ACETAMINOPHEN 325 MG PO TABS
650.0000 mg | ORAL_TABLET | ORAL | Status: DC | PRN
  Administered 2020-01-15: 650 mg via ORAL
  Filled 2020-01-12: qty 2

## 2020-01-12 MED ORDER — MOMETASONE FURO-FORMOTEROL FUM 200-5 MCG/ACT IN AERO
2.0000 | INHALATION_SPRAY | Freq: Two times a day (BID) | RESPIRATORY_TRACT | Status: DC
  Filled 2020-01-12: qty 8.8

## 2020-01-12 MED ORDER — AMLODIPINE BESYLATE 10 MG PO TABS
10.0000 mg | ORAL_TABLET | Freq: Every day | ORAL | Status: DC
  Administered 2020-01-12 – 2020-01-15 (×4): 10 mg via ORAL
  Filled 2020-01-12 (×4): qty 1

## 2020-01-12 MED ORDER — STROKE: EARLY STAGES OF RECOVERY BOOK
Freq: Once | Status: DC
  Filled 2020-01-12: qty 1

## 2020-01-12 MED ORDER — ARIPIPRAZOLE 2 MG PO TABS
2.0000 mg | ORAL_TABLET | Freq: Every day | ORAL | Status: DC
  Administered 2020-01-12 – 2020-01-15 (×4): 2 mg via ORAL
  Filled 2020-01-12 (×4): qty 1

## 2020-01-12 MED ORDER — RISPERIDONE 0.5 MG PO TABS
0.5000 mg | ORAL_TABLET | Freq: Every day | ORAL | Status: DC
  Administered 2020-01-12 – 2020-01-14 (×3): 0.5 mg via ORAL
  Filled 2020-01-12 (×6): qty 1

## 2020-01-12 MED ORDER — CLONAZEPAM 0.5 MG PO TABS
0.5000 mg | ORAL_TABLET | Freq: Every day | ORAL | Status: DC
  Administered 2020-01-12 – 2020-01-15 (×4): 0.5 mg via ORAL
  Filled 2020-01-12 (×4): qty 1

## 2020-01-12 MED ORDER — HYDROCHLOROTHIAZIDE 12.5 MG PO CAPS
12.5000 mg | ORAL_CAPSULE | Freq: Two times a day (BID) | ORAL | Status: DC
  Filled 2020-01-12: qty 1

## 2020-01-12 MED ORDER — ENOXAPARIN SODIUM 40 MG/0.4ML ~~LOC~~ SOLN
40.0000 mg | SUBCUTANEOUS | Status: DC
  Administered 2020-01-12 – 2020-01-14 (×3): 40 mg via SUBCUTANEOUS
  Filled 2020-01-12 (×3): qty 0.4

## 2020-01-12 MED ORDER — SENNOSIDES-DOCUSATE SODIUM 8.6-50 MG PO TABS: 1.0000 | ORAL_TABLET | Freq: Every evening | ORAL | Status: DC | PRN

## 2020-01-12 MED ORDER — LABETALOL HCL 5 MG/ML IV SOLN
10.0000 mg | INTRAVENOUS | Status: DC | PRN
  Administered 2020-01-12 (×2): 10 mg via INTRAVENOUS
  Filled 2020-01-12: qty 4

## 2020-01-12 MED ORDER — MOMETASONE FURO-FORMOTEROL FUM 200-5 MCG/ACT IN AERO
2.0000 | INHALATION_SPRAY | Freq: Two times a day (BID) | RESPIRATORY_TRACT | Status: DC
  Administered 2020-01-12 – 2020-01-15 (×6): 2 via RESPIRATORY_TRACT
  Filled 2020-01-12: qty 8.8

## 2020-01-12 MED ORDER — ASPIRIN 81 MG PO CHEW
81.0000 mg | CHEWABLE_TABLET | Freq: Once | ORAL | Status: AC
  Administered 2020-01-12: 81 mg via ORAL
  Filled 2020-01-12: qty 1

## 2020-01-12 MED ORDER — PANTOPRAZOLE SODIUM 40 MG PO TBEC
40.0000 mg | DELAYED_RELEASE_TABLET | Freq: Two times a day (BID) | ORAL | Status: DC
  Administered 2020-01-12 – 2020-01-15 (×6): 40 mg via ORAL
  Filled 2020-01-12 (×7): qty 1

## 2020-01-12 MED ORDER — AMITRIPTYLINE HCL 50 MG PO TABS
100.0000 mg | ORAL_TABLET | Freq: Every day | ORAL | Status: DC
  Administered 2020-01-12 – 2020-01-14 (×3): 100 mg via ORAL
  Filled 2020-01-12: qty 2
  Filled 2020-01-12: qty 4
  Filled 2020-01-12: qty 2

## 2020-01-12 MED ORDER — HYDROCHLOROTHIAZIDE 12.5 MG PO CAPS
12.5000 mg | ORAL_CAPSULE | Freq: Every day | ORAL | Status: DC
  Administered 2020-01-12: 12.5 mg via ORAL
  Filled 2020-01-12: qty 1

## 2020-01-12 MED ORDER — IOHEXOL 350 MG/ML SOLN: 100.0000 mL | Freq: Once | INTRAVENOUS | Status: DC | PRN

## 2020-01-12 MED ORDER — LINAGLIPTIN 5 MG PO TABS
5.0000 mg | ORAL_TABLET | Freq: Every day | ORAL | Status: DC
  Administered 2020-01-12 – 2020-01-15 (×4): 5 mg via ORAL
  Filled 2020-01-12 (×4): qty 1

## 2020-01-12 MED ORDER — SITAGLIPTIN PHOS-METFORMIN HCL 50-1000 MG PO TABS: 1.0000 | ORAL_TABLET | Freq: Two times a day (BID) | ORAL | Status: DC

## 2020-01-12 MED ORDER — LABETALOL HCL 5 MG/ML IV SOLN: 10.0000 mg | Freq: Once | INTRAVENOUS | Status: DC

## 2020-01-12 MED ORDER — CHLORHEXIDINE GLUCONATE CLOTH 2 % EX PADS
6.0000 | MEDICATED_PAD | Freq: Every day | CUTANEOUS | Status: DC
  Administered 2020-01-12 – 2020-01-15 (×4): 6 via TOPICAL

## 2020-01-12 MED ORDER — ATORVASTATIN CALCIUM 40 MG PO TABS
40.0000 mg | ORAL_TABLET | Freq: Every day | ORAL | Status: DC
  Administered 2020-01-12 – 2020-01-14 (×3): 40 mg via ORAL
  Filled 2020-01-12 (×3): qty 1

## 2020-01-12 MED ORDER — ACETAMINOPHEN 650 MG RE SUPP: 650.0000 mg | RECTAL | Status: DC | PRN

## 2020-01-12 MED ORDER — PROPRANOLOL HCL 20 MG PO TABS
20.0000 mg | ORAL_TABLET | Freq: Two times a day (BID) | ORAL | Status: DC
  Administered 2020-01-12 – 2020-01-15 (×6): 20 mg via ORAL
  Filled 2020-01-12 (×9): qty 1

## 2020-01-12 MED ORDER — INSULIN ASPART 100 UNIT/ML ~~LOC~~ SOLN
0.0000 [IU] | Freq: Three times a day (TID) | SUBCUTANEOUS | Status: DC
  Administered 2020-01-12: 7 [IU] via SUBCUTANEOUS
  Administered 2020-01-13: 4 [IU] via SUBCUTANEOUS
  Administered 2020-01-13: 3 [IU] via SUBCUTANEOUS
  Administered 2020-01-13: 7 [IU] via SUBCUTANEOUS
  Administered 2020-01-14: 4 [IU] via SUBCUTANEOUS
  Administered 2020-01-14: 7 [IU] via SUBCUTANEOUS
  Administered 2020-01-14 – 2020-01-15 (×2): 4 [IU] via SUBCUTANEOUS
  Administered 2020-01-15: 7 [IU] via SUBCUTANEOUS

## 2020-01-12 NOTE — Consult Note (Signed)
NEUROLOGY CONSULTATION NOTE   Date of service: January 12, 2020 Patient Name: Monique Holland MRN:  [id number] DOB:  1965-12-13 Reason for consult: "L sided weakness s/p tPA"  History of Present Illness  Monique Holland is a 54 y.o. female with PMH significant for HTN, DM2, HLD, Bipolar who was seen by teleneurology at [facility] for acute R sided numbness and weakness with a LKW of 1730. Baseline mRS of 0. CTH was negative so she was given tPA and CTA/P attempted but unable due to blown IV. She was transferred to [facility] for CTA/P and evaluation for potential thrombectomy.  Patient was sitting with her friend's and reports she was chilling when this started. Has had prior hx of similar symptoms of focal neurological deficit s/p tPA with the pattern of weakness thought to be non organic. Both times, her workup with MRI Brain was negative for an acute stroke.  She denies any headaches. Noted to be moving her R arm and leg when team members are not in the room.   ROS   Constitutional Denies weight loss, fever and chills.  HEENT Denies changes in vision and hearing.  Respiratory Denies SOB and cough.  CV Denies palpitations and CP  GI Denies abdominal pain, nausea, vomiting and diarrhea.  GU Denies dysuria and urinary frequency.  MSK Denies myalgia and joint pain.  Skin Denies rash and pruritus.  Neurological Denies headache and syncope.  Psychiatric Denies recent changes in mood. Denies anxiety and depression.   Past History   Past Medical History:  Diagnosis Date  . Anxiety   . Asthma   . Bipolar disorder (HCC)   . Depression   . Diabetes mellitus without complication (HCC)   . Frequency of urination   . GERD (gastroesophageal reflux disease)   . History of asthma    last episode yrs ago  . Hypertension   . OSA (obstructive sleep apnea)    pt had study done oct 2014--  schedule for cpap titrate after kidney stone surgery  . Pancreatitis   . Pseudocyst of pancreas   .  Schizophrenia (HCC)   . Stroke (HCC)   . Ureteral calculi    BILATERAL  . Wears glasses    Past Surgical History:  Procedure Laterality Date  . BALLOON DILATION N/A 08/23/2019   Procedure: BALLOON DILATION;  Surgeon: Meridee Score Netty Starring., MD;  Location: [facility];  Service: Gastroenterology;  Laterality: N/A;  . BIOPSY  07/18/2019   Procedure: BIOPSY;  Surgeon: Shellia Cleverly, DO;  Location: [facility];  Service: Gastroenterology;;  . BIOPSY  08/23/2019   Procedure: BIOPSY;  Surgeon: Lemar Lofty., MD;  Location: [facility];  Service: Gastroenterology;;  . BIOPSY  09/18/2019   Procedure: BIOPSY;  Surgeon: Lemar Lofty., MD;  Location: [facility];  Service: Gastroenterology;;  . BIOPSY  10/18/2019   Procedure: BIOPSY;  Surgeon: Lemar Lofty., MD;  Location: [facility];  Service: Gastroenterology;;  . CESAREAN SECTION  1991  &  2002   w/ bilateral tubal ligation in 2002  . CYSTOSCOPY W/ URETERAL STENT PLACEMENT Right 08/10/2018   Procedure: CYSTOSCOPY WITH RETROGRADE PYELOGRAM/URETERAL STENT PLACEMENT;  Surgeon: Jerilee Field, MD;  Location: [facility];  Service: Urology;  Laterality: Right;  . CYSTOSCOPY WITH RETROGRADE PYELOGRAM, URETEROSCOPY AND STENT PLACEMENT Bilateral 05/02/2013   Procedure: CYSTOSCOPY WITH RETROGRADE PYELOGRAM, URETEROSCOPY ;  Surgeon: Valetta Fuller, MD;  Location: [facility];  Service: Urology;  Laterality: Bilateral;  . CYSTOSCOPY WITH  RETROGRADE PYELOGRAM, URETEROSCOPY AND STENT PLACEMENT Right 08/15/2018   Procedure: CYSTOSCOPY WITH RIGHT RETROGRADE PYELOGRAM, RIGHT URETEROSCOPY WITH HOLMIUM LASER AND STENT PLACEMENT;  Surgeon: Crista Elliot, MD;  Location: [facility];  Service: Urology;  Laterality: Right;  . CYSTOSCOPY WITH STENT PLACEMENT Left 05/02/2013   Procedure: CYSTOSCOPY WITH STENT PLACEMENT;  Surgeon: Valetta Fuller, MD;  Location: [facility];  Service: Urology;  Laterality: Left;   . ENDOROTOR N/A 08/26/2019   Procedure: [id number];  Surgeon: Mansouraty, Netty Starring., MD;  Location: [facility]COPY;  Service: Gastroenterology;  Laterality: N/A;  . ENDOROTOR  09/05/2019   Procedure: [id number];  Surgeon: Mansouraty, Netty Starring., MD;  Location: [facility];  Service: Gastroenterology;;  . ESOPHAGOGASTRODUODENOSCOPY N/A 08/26/2019   Procedure: ESOPHAGOGASTRODUODENOSCOPY (EGD) with NECROSECTOMY;  Surgeon: Lemar Lofty., MD;  Location: [facility];  Service: Gastroenterology;  Laterality: N/A;  . ESOPHAGOGASTRODUODENOSCOPY N/A 10/18/2019   Procedure: ESOPHAGOGASTRODUODENOSCOPY (EGD);  Surgeon: Lemar Lofty., MD;  Location: [facility];  Service: Gastroenterology;  Laterality: N/A;  . ESOPHAGOGASTRODUODENOSCOPY (EGD) WITH PROPOFOL N/A 07/18/2019   Procedure: ESOPHAGOGASTRODUODENOSCOPY (EGD) WITH PROPOFOL;  Surgeon: Shellia Cleverly, DO;  Location: [facility];  Service: Gastroenterology;  Laterality: N/A;  . ESOPHAGOGASTRODUODENOSCOPY (EGD) WITH PROPOFOL N/A 08/18/2019   Procedure: ESOPHAGOGASTRODUODENOSCOPY (EGD) WITH PROPOFOL;  Surgeon: Hilarie Fredrickson, MD;  Location: [facility];  Service: Endoscopy;  Laterality: N/A;  . ESOPHAGOGASTRODUODENOSCOPY (EGD) WITH PROPOFOL N/A 08/21/2019   Procedure: ESOPHAGOGASTRODUODENOSCOPY (EGD) WITH PROPOFOL;  Surgeon: Meridee Score Netty Starring., MD;  Location: [facility];  Service: Gastroenterology;  Laterality: N/A;  . ESOPHAGOGASTRODUODENOSCOPY (EGD) WITH PROPOFOL N/A 08/23/2019   Procedure: ESOPHAGOGASTRODUODENOSCOPY (EGD) WITH PROPOFOL;  Surgeon: Meridee Score Netty Starring., MD;  Location: [facility];  Service: Gastroenterology;  Laterality: N/A;  . ESOPHAGOGASTRODUODENOSCOPY (EGD) WITH PROPOFOL N/A 09/05/2019   Procedure: ESOPHAGOGASTRODUODENOSCOPY (EGD) WITH PROPOFOL;  Surgeon: Meridee Score Netty Starring., MD;  Location: [facility] ENDOSCOPY;  Service: Gastroenterology;  Laterality: N/A;   With pancreatic pseudocyst necrosectomy via established  cyst gastrostomy  . ESOPHAGOGASTRODUODENOSCOPY (EGD) WITH PROPOFOL N/A 09/18/2019   Procedure: ESOPHAGOGASTRODUODENOSCOPY (EGD) WITH PROPOFOL;  Surgeon: Meridee Score Netty Starring., MD;  Location: [facility];  Service: Gastroenterology;  Laterality: N/A;  . EUS  08/23/2019   Procedure: UPPER ENDOSCOPIC ULTRASOUND (EUS) LINEAR;  Surgeon: Lemar Lofty., MD;  Location: [facility];  Service: Gastroenterology;;  . FOREIGN BODY REMOVAL  09/05/2019   Procedure: FOREIGN BODY REMOVAL;  Surgeon: Lemar Lofty., MD;  Location: [facility] ENDOSCOPY;  Service: Gastroenterology;;  . FOREIGN BODY REMOVAL  09/18/2019   Procedure: FOREIGN BODY REMOVAL;  Surgeon: Lemar Lofty., MD;  Location: [facility];  Service: Gastroenterology;;  . HOLMIUM LASER APPLICATION Bilateral 05/02/2013   Procedure: HOLMIUM LASER APPLICATION;  Surgeon: Valetta Fuller, MD;  Location: [facility];  Service: Urology;  Laterality: Bilateral;  . LAPAROSCOPIC APPENDECTOMY N/A 05/22/2014   Procedure: APPENDECTOMY LAPAROSCOPIC;  Surgeon: Glenna Fellows, MD;  Location: [facility];  Service: General;  Laterality: N/A;  . PANCREATIC STENT PLACEMENT  08/23/2019   Procedure: PANCREATIC STENT PLACEMENT;  Surgeon: Lemar Lofty., MD;  Location: [facility];  Service: Gastroenterology;;  . PANCREATIC STENT PLACEMENT  09/05/2019   Procedure: PANCREATIC STENT PLACEMENT;  Surgeon: Lemar Lofty., MD;  Location: [facility] ENDOSCOPY;  Service: Gastroenterology;;  . PANCREATIC STENT PLACEMENT  09/18/2019   Procedure: PANCREATIC STENT PLACEMENT;  Surgeon: Lemar Lofty., MD;  Location: [facility];  Service: Gastroenterology;;  cyst gastrostomy double pigtail stent placement   . STENT REMOVAL  08/26/2019  Procedure: STENT REMOVAL;  Surgeon: Lemar Lofty., MD;  Location: [facility];  Service: Gastroenterology;;  . Francine Graven REMOVAL  09/05/2019   Procedure: STENT REMOVAL;  Surgeon: Lemar Lofty., MD;  Location: [facility] ENDOSCOPY;  Service: Gastroenterology;;  . Francine Graven REMOVAL  09/18/2019   Procedure: STENT REMOVAL;  Surgeon: Lemar Lofty., MD;  Location: [facility];  Service: Gastroenterology;;  . TONSILLECTOMY  age 12  . UPPER ESOPHAGEAL ENDOSCOPIC ULTRASOUND (EUS) N/A 08/21/2019   Procedure: UPPER ESOPHAGEAL ENDOSCOPIC ULTRASOUND (EUS);  Surgeon: Lemar Lofty., MD;  Location: [facility];  Service: Gastroenterology;  Laterality: N/A;  For cyst gastrostomy   Family History  Family history unknown: Yes   Social History   Socioeconomic History  . Marital status: Married    Spouse name: Not on file  . Number of children: Not on file  . Years of education: Not on file  . Highest education level: Not on file  Occupational History  . Not on file  Tobacco Use  . Smoking status: Never Smoker  . Smokeless tobacco: Never Used  Vaping Use  . Vaping Use: Never used  Substance and Sexual Activity  . Alcohol use: No  . Drug use: No  . Sexual activity: Not on file  Other Topics Concern  . Not on file  Social History Narrative  . Not on file   Social Determinants of Health   Financial Resource Strain:   . Difficulty of Paying Living Expenses: Not on file  Food Insecurity:   . Worried About Programme researcher, broadcasting/film/video in the Last Year: Not on file  . Ran Out of Food in the Last Year: Not on file  Transportation Needs:   . Lack of Transportation (Medical): Not on file  . Lack of Transportation (Non-Medical): Not on file  Physical Activity:   . Days of Exercise per Week: Not on file  . Minutes of Exercise per Session: Not on file  Stress:   . Feeling of Stress : Not on file  Social Connections:   . Frequency of Communication with Friends and Family: Not on file  . Frequency of Social Gatherings with Friends and Family: Not on file  . Attends Religious Services: Not on file  . Active Member of Clubs or Organizations: Not on file  . Attends Banker  Meetings: Not on file  . Marital Status: Not on file   No Known Allergies  Medications  (Not in a hospital admission)    Vitals  Temp:  [98.4 F (36.9 C)-99.1 F (37.3 C)] 98.4 F (36.9 C) (08/21 2321) Pulse Rate:  [92-95] 94 (08/21 2321) Resp:  [14-20] 18 (08/21 2321) BP: (119-176)/(76-98) 170/92 (08/21 2230) SpO2:  [97 %-100 %] 99 % (08/21 2321) Weight:  [148.8 kg] 148.8 kg (08/21 2042)  Body mass index is 49.88 kg/m.  Physical Exam   General: Laying comfortably in bed; in no acute distress.  HENT: Normal oropharynx and mucosa. Normal external appearance of ears and nose. Neck: Supple, no pain or tenderness CV: No JVD. No peripheral edema. Pulmonary: Symmetric Chest rise. Normal respiratory effort. Abdomen: Soft to touch, non-tender Ext: No cyanosis, edema, or deformity  Skin: No rash. Normal palpation of skin.   Musculoskeletal: Normal digits and nails by inspection. No clubbing.  Neurologic Examination  Mental status/Cognition: Alert, oriented to self, place, month and year, good attention. Speech/language: Fluent, comprehension intact, object naming intact, repetition intact. Cranial nerves:   CN II Pupils equal and reactive to light,  no VF deficits   CN III,IV,VI EOM intact, no gaze preference or deviation, no nystagmus   CN V Decreased sensation in R face.   CN VII no asymmetry, no nasolabial fold flattening   CN VIII normal hearing to speech   CN IX & X normal palatal elevation, no uvular deviation   CN XI 5/5 head turn and 5/5 shoulder shrug bilaterally   CN XII midline tongue protrusion   Motor:  Muscle bulk: normal, tone normal Mvmt Root Nerve  Muscle Right Left Comments  SA C5/6 Ax Deltoid 0 5   EF C5/6 Mc Biceps 0 5   EE C6/7/8 Rad Triceps 0 5   WF C6/7 Med FCR 0 5   WE C7/8 PIN ECU 0 5   F Ab C8/T1 U ADM/FDI 0 5   HF L1/2/3 Fem Illopsoas 0 5   KE L2/3/4 Fem Quad 0 5   DF L4/5 D Peron Tib Ant 0 5   PF S1/2 Tibial Grc/Sol 0 5    Reflexes:   Right Left Comments  Pectoralis      Biceps (C5/6) 2 2   Brachioradialis (C5/6) 2 2    Triceps (C6/7) 2 2    Patellar (L3/4) 2 2    Achilles (S1) 1 1    Hoffman      Plantar     Jaw jerk    Sensation:  Light touch Absent in R face, RUE and RLE   Pin prick Absent in R face, RUE and RLE   Temperature    Vibration   Proprioception    Coordination/Complex Motor:  - Finger to Nose intact on left - Heel to shin intact on left. - Rapid alternating movement normal on left. - Gait: deferred.  Labs   Lab Results  Component Value Date   NA 138 01/11/2020   K 4.3 01/11/2020   CL 101 01/11/2020   CO2 25 01/11/2020   GLUCOSE 175 (H) 01/11/2020   BUN 16 01/11/2020   CREATININE 1.05 (H) 01/11/2020   CALCIUM 9.4 01/11/2020   ALBUMIN 3.9 01/11/2020   AST 19 01/11/2020   ALT 18 01/11/2020   ALKPHOS 57 01/11/2020   BILITOT 0.1 (L) 01/11/2020   GFRNONAA >60 01/11/2020   GFRAA >60 01/11/2020     Imaging and Diagnostic studies  CTH was negative for a large hypodensity concerning for a large territory infarct or hyperdensity concerning for an ICH. Normal CTA of the head and neck.  Impression   Monique Holland is a 54 y.o. female with PMH significant for HTN, DM2, HLD, Bipolar who was seen by teleneurology at [facility] for acute R sided numbness and weakness with a LKW of 1730. She was given tPA by Teleneurology. She is not a candidate for thrombectomy due to no LVO, no thrombus. Her neurologic examination is notable for R arm and leg plegia with R face, arm and leg numbness. I have some concern for non organic weakness given La belle indifference, +Hoovers sign on my exam, midline splitting sensory impairment to pinprick. Will admit for post tPA checks and CTH. Will get further workup with MRI Brain and if concerning for an acute stroke, will get further workup.  Recommendations  Plan: - Frequent NeuroChecks per stroke unit protocol:  - q 15 min for the first hour  - q 1 hour  thereafter for the first 24 hours  - q 2 hours after 24 hours for a Level 1 Stroke Alert - Initial CTH demonstrated  no acute hemorrhage or mass - MRI Brain - pending - CTA - no LVO - Will get HbA1c, LDL and TTE if MRI is positive for stroke. - Antithrombotic: Start ASA 81 mg daily if 24 h CTH does not show acute hemorrhage - DVT prophylaxis: SCDs. Pharmacologic prophylaxis if 24 h CTH does not demonstrate acute hemorrhage - Systolic Blood Pressure goal: < 180 mm Hg - Telemetry monitoring for arrhythmia: 72 hours - Swallow screen - ordered - PT/OT consult. ______________________________________________________________________  Thank you for the opportunity to take part in the care of this patient. If you have any further questions, please contact the neurology consultation attending.  Signed,  Erick Blinks [facility] Pager Number [phone]

## 2020-01-12 NOTE — ED Notes (Signed)
PT RETURNED FROM MRI

## 2020-01-12 NOTE — ED Notes (Signed)
The pt had lifted her rt leg and crossed it over the lt leg at   The ankle but then reported that she could not move it

## 2020-01-12 NOTE — Progress Notes (Signed)
STROKE TEAM PROGRESS NOTE   INTERVAL HISTORY Her RN is at the bedside.  Patient stated that she cannot feel her right side completely.  However, she has Hoover signs and positive on yes/no test, consistent with functional component.  Her diastolic BP was high, resume BP meds and on labetalol as needed.  MRI no stroke.  CT tonight at 24 hours of TPA.  Patient did admit that she has stress at this moment due to her landlord is selling the house and she has to move out.  OBJECTIVE Vitals:   01/12/20 1030 01/12/20 1100 01/12/20 1130 01/12/20 1200  BP: (!) 151/120 (!) 143/110 (!) 155/113 (!) 163/121  Pulse: 89 92 89 92  Resp:      Temp:      TempSrc:      SpO2: 97% 93% 97% 97%  Weight:        CBC:  Recent Labs  Lab 01/11/20 2118  WBC 7.4  NEUTROABS 4.3  HGB 10.1*  HCT 34.3*  MCV 77.4*  PLT 278    Basic Metabolic Panel:  Recent Labs  Lab 01/11/20 2118  NA 138  K 4.3  CL 101  CO2 25  GLUCOSE 175*  BUN 16  CREATININE 1.05*  CALCIUM 9.4    Lipid Panel:     Component Value Date/Time   CHOL 144 11/28/2019 0813   TRIG 112 11/28/2019 0813   HDL 64 11/28/2019 0813   CHOLHDL 2.3 11/28/2019 0813   VLDL 22 11/28/2019 0813   LDLCALC 58 11/28/2019 0813   HgbA1c:  Lab Results  Component Value Date   HGBA1C 8.7 (H) 01/12/2020   Urine Drug Screen:     Component Value Date/Time   LABOPIA NONE DETECTED 01/11/2020 2242   COCAINSCRNUR NONE DETECTED 01/11/2020 2242   LABBENZ NONE DETECTED 01/11/2020 2242   AMPHETMU NONE DETECTED 01/11/2020 2242   THCU NONE DETECTED 01/11/2020 2242   LABBARB NONE DETECTED 01/11/2020 2242    Alcohol Level     Component Value Date/Time   ETH <10 01/11/2020 2118    IMAGING  MR BRAIN WO CONTRAST 01/12/2020 IMPRESSION: Normal MRI of the brain.    CT ANGIO HEAD CODE STROKE CT ANGIO NECK CODE STROKE 01/12/2020 IMPRESSION:  Normal CTA of the head and neck.  CT HEAD CODE STROKE WO CONTRAST 01/11/20 IMPRESSION: 1. Normal head CT as  seen previously.  2. ASPECTS is 10   Transthoracic Echocardiogram  09/29/2019 IMPRESSIONS: 1. Left ventricular ejection fraction, by estimation, is 50 to 55%. The  left ventricle has low normal function. The left ventricle has no regional  wall motion abnormalities. There is mild left ventricular hypertrophy.  Left ventricular diastolic  parameters are consistent with Grade II diastolic dysfunction  (pseudonormalization).  2. Right ventricular systolic function is normal. The right ventricular  size is normal.  3. The mitral valve is normal in structure. No evidence of mitral valve  regurgitation. No evidence of mitral stenosis.  4. The aortic valve is normal in structure. Aortic valve regurgitation is  not visualized. No aortic stenosis is present.   ECG - SR rate 93 BPM. (See cardiology reading for complete details)  PHYSICAL EXAM  Temp:  [98.4 F (36.9 C)-99.1 F (37.3 C)] 98.8 F (37.1 C) (08/22 1630) Pulse Rate:  [88-96] 89 (08/22 1700) Resp:  [0-27] 25 (08/22 1700) BP: (109-176)/(72-121) 148/115 (08/22 1700) SpO2:  [93 %-100 %] 98 % (08/22 1700) Weight:  [148.8 kg] 148.8 kg (08/21 2042)  General - Well nourished,  well developed, mildly anxious.  Ophthalmologic - fundi not visualized due to noncooperation.  Cardiovascular - Regular rhythm and rate.  Mental Status -  Level of arousal and orientation to time, place, and person were intact. Language including expression, naming, repetition, comprehension was assessed and found intact.  Cranial Nerves II - XII - II - Visual field intact OU. III, IV, VI - Extraocular movements intact. V - Facial sensation total loss at right subjectively, positive yes/no test. VII - Facial movement intact bilaterally. VIII - Hearing & vestibular intact bilaterally. X - Palate elevates symmetrically. XI - Chin turning & shoulder shrug intact bilaterally. XII - Tongue protrusion intact.  Motor Strength - The patient's strength was  normal in LUE and LLE, no moving on request of RUE and RLE. When I passively lifted up the RUE, she was actively pushing down.  Positive Hoover signs on right lower extremity. Bulk was normal and fasciculations were absent.   Motor Tone - Muscle tone was assessed at the neck and appendages and was normal.  Reflexes - The patient's reflexes were symmetrical in all extremities and she had no pathological reflexes.  Sensory - Light touch, temperature/pinprick were assessed and were total loss on the right subjectively, possible yes/no test.  Coordination - The patient had normal movements in the left hand and foot with no ataxia or dysmetria.  Tremor was absent.  Gait and Station - deferred.    ASSESSMENT/PLAN [name] is a 54 y.o. female with history of HTN, DM2, OSA, asthma, Schizophrenia, HLD, Bipolar who was seen by teleneurology at [facility] for acute R sided numbness and weakness. CTH was negative so she was given tPA and CTA/P attempted but unable due to blown IV. The pt was tx'd to [facility]. Hx of similar symptoms of focal neurological deficit s/p tPA with the pattern of weakness thought to be non organic. Noted to be moving her R arm and leg when team members are not in the room. MRI negative for stroke.  tPA Sat 8/21 at 2100  Conversion disorder s/p tPA  CT Head - normal. ASPECTS 10  MRI head - normal  CTA H&N - normal  2D Echo - 09/29/19 - EF 50-55%. No cardiac source of emboli identified.   Sars Corona Virus 2 - negative  LDL - 76  HgbA1c - 8.7   UDS - negative  VTE prophylaxis - SCDs  No antithrombotic prior to admission, now on No antithrombotic with 24h s/p tPA  Therapy recommendations:  Pending  Disposition:  Pending  Patient did admit that she has stress due to her landlord is selling the house and she has to move out.  Hx of conversion disorder   09/2019 admitted for right sided weakness status post TPA.  CT negative, CT head and neck negative, MRI no  acute stroke.  EF 50 to 55%, LDL 86, A1c 14.1, discharged with aspirin and Lipitor 40  11/2019- presented with again right-sided weakness and numbness, exam inconsistent, MRI negative for stroke.  Concerning for conversion disorder at that time.  Hypertension  Home BP meds: Norvasc ; Zestoretic ; Propanolol  Current BP meds: Home medication resumed  High diastolic BP  On Norvasc 10, propranolol 20 twice daily, HCTZ 12.5 and lisinopril 20 increased from daily to twice daily . BP goal less than 180/105 after TPA . Long-term BP goal normotensive  Hyperlipidemia  Home Lipid lowering medication: Lipitor 40 mg daily  LDL 78, goal < 100  Current lipid lowering  medication: Resumed Lipitor 40  Continue statin at discharge  Diabetes  Home diabetic meds: insulin ; Janumt   Current diabetic meds: Tradjenta  HgbA1c 8.7, goal < 7.0  SSI  CBG monitoring  Close PCP follow-up  Other Stroke Risk Factors  Obesity, Body mass index is 49.88 kg/m., recommend weight loss, diet and exercise as appropriate   Obstructive sleep apnea  Other Active Problems  Code status - Full code   Psychiatric disorder on Lamictal, Resporal, trazodone, amitriptyline, Abilify  Hospital day # 0  This patient is critically ill due to strokelike symptoms status post TPA, hypertension post TPA, hyperglycemia and at significant risk of neurological worsening, death form hemorrhage, seizure, DKA. This patient's care requires constant monitoring of vital signs, hemodynamics, respiratory and cardiac monitoring, review of multiple databases, neurological assessment, discussion with family, other specialists and medical decision making of high complexity. I spent 35 minutes of neurocritical care time in the care of this patient.  Marvel Plan, MD PhD Stroke Neurology 01/12/2020 5:24 PM  To contact Stroke Continuity provider, please refer to [email or web address]. After hours, contact General Neurology

## 2020-01-12 NOTE — Progress Notes (Signed)
Received 54 y/o female patient from ED. Report received from ED RN. Patient A&OX4, hypertensive upon arrival, denying HA. Admission assessment completed. Belongings documented: Clothes, cell phone and charger, purse and wallet ($5), glasses, and IPAD.

## 2020-01-12 NOTE — ED Notes (Signed)
Pt has iv by iv team c-t notified

## 2020-01-12 NOTE — Progress Notes (Signed)
BP's remain elevated. Dr. Roda Shutters aware, orders noted. Patient educated with good understanding verbalized.

## 2020-01-12 NOTE — Progress Notes (Signed)
PT Cancellation Note  Patient Details Name: Monique Holland MRN: [id number] DOB: [birth year]   Cancelled Treatment:    Reason Eval/Treat Not Completed: Active bedrest order   Ilda Foil 01/12/2020, 8:40 AM  Aida Raider, PT  Office # [phone] Pager [phone]

## 2020-01-12 NOTE — ED Notes (Signed)
The pt had her rt arm across her chest and when I commented on it  She picked I up and placed it beside her.

## 2020-01-12 NOTE — ED Notes (Signed)
THE PT BRUSHED THE HAIR FROM HER EYES WITH HER RT ARM

## 2020-01-12 NOTE — Evaluation (Signed)
Speech Language Pathology Evaluation Patient Details Name: Monique Holland MRN: [id number] DOB: [birth year] Today's Date: 01/12/2020 Time: 1138- 1151    Problem List:  Patient Active Problem List   Diagnosis Date Noted   Received tissue plasminogen activator (tPA) less than 24 hours prior to arrival 01/12/2020   Acute bronchitis 11/13/2019   Hyperglycemia due to diabetes mellitus (HCC) 11/13/2019   DKA (diabetic ketoacidoses) (HCC) 11/13/2019   Prolonged QT interval 10/17/2019   Pancreatitis 10/16/2019   Hyperlipidemia 10/01/2019   OSA (obstructive sleep apnea) 10/01/2019   GERD (gastroesophageal reflux disease) 10/01/2019   Right sided weakness 09/29/2019   Stroke-like episode s/p tPA 09/29/2019   History of pancreatitis    Intractable abdominal pain 09/15/2019   Hyperosmolar hyperglycemic state (HHS) (HCC) 09/15/2019   Non-intractable vomiting    Hematemesis with nausea    Abdominal pain 08/03/2019   Essential hypertension 08/03/2019   Pancreatic pseudocyst    Nausea and vomiting in adult    Gastritis and gastroduodenitis    Schizophrenia (HCC) 09/02/2018   Obesity, Class III, BMI 40-49.9 (morbid obesity) (HCC) 09/02/2018   Iron deficiency 09/02/2018   Symptomatic anemia 09/01/2018   Hypokalemia 09/01/2018   AKI (acute kidney injury) (HCC) 08/11/2018   Hypotension 08/11/2018   Type 2 diabetes mellitus (HCC) 08/11/2018   Ureteral calculus, right 05/02/2013   Past Medical History:  Past Medical History:  Diagnosis Date   Anxiety    Asthma    Bipolar disorder (HCC)    Depression    Diabetes mellitus without complication (HCC)    Frequency of urination    GERD (gastroesophageal reflux disease)    History of asthma    last episode yrs ago   Hypertension    OSA (obstructive sleep apnea)    pt had study done oct 2014--  schedule for cpap titrate after kidney stone surgery   Pancreatitis    Pseudocyst of pancreas     Schizophrenia (HCC)    Stroke (HCC)    Ureteral calculi    BILATERAL   Wears glasses    Past Surgical History:  Past Surgical History:  Procedure Laterality Date   BALLOON DILATION N/A 08/23/2019   Procedure: BALLOON DILATION;  Surgeon: Lemar Lofty., MD;  Location: [facility];  Service: Gastroenterology;  Laterality: N/A;   BIOPSY  07/18/2019   Procedure: BIOPSY;  Surgeon: Shellia Cleverly, DO;  Location: [facility];  Service: Gastroenterology;;   BIOPSY  08/23/2019   Procedure: BIOPSY;  Surgeon: Lemar Lofty., MD;  Location: [facility];  Service: Gastroenterology;;   BIOPSY  09/18/2019   Procedure: BIOPSY;  Surgeon: Lemar Lofty., MD;  Location: [facility];  Service: Gastroenterology;;   BIOPSY  10/18/2019   Procedure: BIOPSY;  Surgeon: Lemar Lofty., MD;  Location: [facility];  Service: Gastroenterology;;   CESAREAN SECTION  1991  &  2002   w/ bilateral tubal ligation in 2002   CYSTOSCOPY W/ URETERAL STENT PLACEMENT Right 08/10/2018   Procedure: CYSTOSCOPY WITH RETROGRADE PYELOGRAM/URETERAL STENT PLACEMENT;  Surgeon: Jerilee Field, MD;  Location: [facility];  Service: Urology;  Laterality: Right;   CYSTOSCOPY WITH RETROGRADE PYELOGRAM, URETEROSCOPY AND STENT PLACEMENT Bilateral 05/02/2013   Procedure: CYSTOSCOPY WITH RETROGRADE PYELOGRAM, URETEROSCOPY ;  Surgeon: Valetta Fuller, MD;  Location: [facility];  Service: Urology;  Laterality: Bilateral;   CYSTOSCOPY WITH RETROGRADE PYELOGRAM, URETEROSCOPY AND STENT PLACEMENT Right 08/15/2018   Procedure: CYSTOSCOPY WITH RIGHT RETROGRADE PYELOGRAM, RIGHT URETEROSCOPY WITH HOLMIUM LASER AND STENT PLACEMENT;  Surgeon: Crista Elliot, MD;  Location: [facility];  Service: Urology;  Laterality: Right;   CYSTOSCOPY WITH STENT PLACEMENT Left 05/02/2013   Procedure: CYSTOSCOPY WITH STENT PLACEMENT;  Surgeon: Valetta Fuller, MD;  Location: [facility];  Service:  Urology;  Laterality: Left;   ENDOROTOR N/A 08/26/2019   Procedure: [id number];  Surgeon: Mansouraty, Netty Starring., MD;  Location: [facility];  Service: Gastroenterology;  Laterality: N/A;   ENDOROTOR  09/05/2019   Procedure: [id number];  Surgeon: Mansouraty, Netty Starring., MD;  Location: [facility] ENDOSCOPY;  Service: Gastroenterology;;   ESOPHAGOGASTRODUODENOSCOPY N/A 08/26/2019   Procedure: ESOPHAGOGASTRODUODENOSCOPY (EGD) with NECROSECTOMY;  Surgeon: Lemar Lofty., MD;  Location: [facility] ENDOSCOPY;  Service: Gastroenterology;  Laterality: N/A;   ESOPHAGOGASTRODUODENOSCOPY N/A 10/18/2019   Procedure: ESOPHAGOGASTRODUODENOSCOPY (EGD);  Surgeon: Lemar Lofty., MD;  Location: [facility];  Service: Gastroenterology;  Laterality: N/A;   ESOPHAGOGASTRODUODENOSCOPY (EGD) WITH PROPOFOL N/A 07/18/2019   Procedure: ESOPHAGOGASTRODUODENOSCOPY (EGD) WITH PROPOFOL;  Surgeon: Shellia Cleverly, DO;  Location: [facility];  Service: Gastroenterology;  Laterality: N/A;   ESOPHAGOGASTRODUODENOSCOPY (EGD) WITH PROPOFOL N/A 08/18/2019   Procedure: ESOPHAGOGASTRODUODENOSCOPY (EGD) WITH PROPOFOL;  Surgeon: Hilarie Fredrickson, MD;  Location: [facility];  Service: Endoscopy;  Laterality: N/A;   ESOPHAGOGASTRODUODENOSCOPY (EGD) WITH PROPOFOL N/A 08/21/2019   Procedure: ESOPHAGOGASTRODUODENOSCOPY (EGD) WITH PROPOFOL;  Surgeon: Meridee Score Netty Starring., MD;  Location: [facility];  Service: Gastroenterology;  Laterality: N/A;   ESOPHAGOGASTRODUODENOSCOPY (EGD) WITH PROPOFOL N/A 08/23/2019   Procedure: ESOPHAGOGASTRODUODENOSCOPY (EGD) WITH PROPOFOL;  Surgeon: Meridee Score Netty Starring., MD;  Location: [facility];  Service: Gastroenterology;  Laterality: N/A;   ESOPHAGOGASTRODUODENOSCOPY (EGD) WITH PROPOFOL N/A 09/05/2019   Procedure: ESOPHAGOGASTRODUODENOSCOPY (EGD) WITH PROPOFOL;  Surgeon: Meridee Score Netty Starring., MD;  Location: [facility] ENDOSCOPY;  Service: Gastroenterology;  Laterality: N/A;   With pancreatic pseudocyst  necrosectomy via established cyst gastrostomy   ESOPHAGOGASTRODUODENOSCOPY (EGD) WITH PROPOFOL N/A 09/18/2019   Procedure: ESOPHAGOGASTRODUODENOSCOPY (EGD) WITH PROPOFOL;  Surgeon: Meridee Score Netty Starring., MD;  Location: [facility];  Service: Gastroenterology;  Laterality: N/A;   EUS  08/23/2019   Procedure: UPPER ENDOSCOPIC ULTRASOUND (EUS) LINEAR;  Surgeon: Lemar Lofty., MD;  Location: [facility];  Service: Gastroenterology;;   FOREIGN BODY REMOVAL  09/05/2019   Procedure: FOREIGN BODY REMOVAL;  Surgeon: Lemar Lofty., MD;  Location: [facility] ENDOSCOPY;  Service: Gastroenterology;;   FOREIGN BODY REMOVAL  09/18/2019   Procedure: FOREIGN BODY REMOVAL;  Surgeon: Lemar Lofty., MD;  Location: [facility];  Service: Gastroenterology;;   HOLMIUM LASER APPLICATION Bilateral 05/02/2013   Procedure: HOLMIUM LASER APPLICATION;  Surgeon: Valetta Fuller, MD;  Location: [facility];  Service: Urology;  Laterality: Bilateral;   LAPAROSCOPIC APPENDECTOMY N/A 05/22/2014   Procedure: APPENDECTOMY LAPAROSCOPIC;  Surgeon: Glenna Fellows, MD;  Location: [facility];  Service: General;  Laterality: N/A;   PANCREATIC STENT PLACEMENT  08/23/2019   Procedure: PANCREATIC STENT PLACEMENT;  Surgeon: Lemar Lofty., MD;  Location: [facility];  Service: Gastroenterology;;   PANCREATIC STENT PLACEMENT  09/05/2019   Procedure: PANCREATIC STENT PLACEMENT;  Surgeon: Lemar Lofty., MD;  Location: [facility] ENDOSCOPY;  Service: Gastroenterology;;   PANCREATIC STENT PLACEMENT  09/18/2019   Procedure: PANCREATIC STENT PLACEMENT;  Surgeon: Lemar Lofty., MD;  Location: [facility];  Service: Gastroenterology;;  cyst gastrostomy double pigtail stent placement    STENT REMOVAL  08/26/2019   Procedure: STENT REMOVAL;  Surgeon: Lemar Lofty., MD;  Location: [facility] END[facility]COPY;  Service: Gastroenterology;;   Francine Graven REMOVAL  09/05/2019  Procedure: STENT REMOVAL;   Surgeon: Lemar Lofty., MD;  Location: [facility];  Service: Gastroenterology;;   Francine Graven REMOVAL  09/18/2019   Procedure: STENT REMOVAL;  Surgeon: Lemar Lofty., MD;  Location: [facility];  Service: Gastroenterology;;   TONSILLECTOMY  age 68   UPPER ESOPHAGEAL ENDOSCOPIC ULTRASOUND (EUS) N/A 08/21/2019   Procedure: UPPER ESOPHAGEAL ENDOSCOPIC ULTRASOUND (EUS);  Surgeon: Lemar Lofty., MD;  Location: [facility];  Service: Gastroenterology;  Laterality: N/A;  For cyst gastrostomy   HPI:  Monique Holland is a 54 y.o. female with PMH significant for HTN, DM2, HLD, Bipolar who was seen by teleneurology at [facility] for acute R sided numbness and weakness with a LKW of 1730. Baseline mRS of 0. CTH was negative so she was given tPA and CTA/P attempted but unable due to blown IV. She was transferred to [facility] for CTA/P and evaluation for potential thrombectomy. Noted to be moving right arm and leg when team members are not in room per ED notes. Per neuro MD notes, "some concern for non organic weakness given La belle indifference, +Hoovers sign on my exam, midline splitting sensory impairment to pinprick. Will admit for post tPA checks and CTH."   Assessment / Plan / Recommendation Clinical Impression  Administered Cognistat to evaluate cognitive linguistic function. Patient scored [facility] for all areas assessed. Affect is someone flat however question if this is baseline. No SLP f/u indicated at this time however should symptoms change/worsen or cognitive-lingusitic deficits be noted as patient returns to ADLs, please re consult.     SLP Assessment  SLP Recommendation/Assessment: Patient does not need any further Speech Lanaguage Pathology Services SLP Visit Diagnosis: Cognitive communication deficit (R41.841)    Follow Up Recommendations  None          SLP Evaluation Cognition  Overall Cognitive Status: Within Functional Limits for tasks assessed        Comprehension  Auditory Comprehension Overall Auditory Comprehension: Appears within functional limits for tasks assessed Visual Recognition/Discrimination Discrimination: Within Function Limits Reading Comprehension Reading Status: Within funtional limits    Expression Expression Primary Mode of Expression: Verbal Verbal Expression Overall Verbal Expression: Appears within functional limits for tasks assessed Written Expression Dominant Hand: Right   Oral / Motor  Oral Motor/Sensory Function Overall Oral Motor/Sensory Function: Within functional limits Motor Speech Overall Motor Speech: Appears within functional limits for tasks assessed   GO            Ferdinand Lango MA, CCC-SLP          Korayma Hagwood Meryl 01/12/2020, 11:53 AM

## 2020-01-12 NOTE — Progress Notes (Signed)
OT Cancellation Note  Patient Details Name: Monique Holland MRN: [id number] DOB: Mar 20, 1966   Cancelled Treatment:    Reason Eval/Treat Not Completed: Active bedrest order.  Will reattempt.  Eber Jones., OTR/L Acute Rehabilitation Services Pager [phone] Office [phone]   Jeani Hawking M 01/12/2020, 2:07 PM

## 2020-01-12 NOTE — Progress Notes (Signed)
Patient observed eating lunch with her RUE without difficulty.

## 2020-01-12 NOTE — ED Notes (Signed)
To ct

## 2020-01-12 NOTE — Progress Notes (Signed)
Patient frequently seen using her RUE and RLE when she thinks no one is observing her. During assessments with nurse, patient continues to state she cannot move right arm or leg.

## 2020-01-13 DIAGNOSIS — F449 Dissociative and conversion disorder, unspecified: Secondary | ICD-10-CM | POA: Diagnosis present

## 2020-01-13 DIAGNOSIS — E1165 Type 2 diabetes mellitus with hyperglycemia: Secondary | ICD-10-CM

## 2020-01-13 LAB — GLUCOSE, CAPILLARY
Glucose-Capillary: 163 mg/dL — ABNORMAL HIGH (ref 70–99)
Glucose-Capillary: 240 mg/dL — ABNORMAL HIGH (ref 70–99)
Glucose-Capillary: 136 mg/dL — ABNORMAL HIGH (ref 70–99)
Glucose-Capillary: 170 mg/dL — ABNORMAL HIGH (ref 70–99)

## 2020-01-13 MED ORDER — HYDROCHLOROTHIAZIDE 12.5 MG PO CAPS: 12.5000 mg | ORAL_CAPSULE | Freq: Two times a day (BID) | ORAL | Status: DC

## 2020-01-13 MED ORDER — LISINOPRIL 20 MG PO TABS: 20.0000 mg | ORAL_TABLET | Freq: Every day | ORAL | Status: DC

## 2020-01-13 MED ORDER — LISINOPRIL-HYDROCHLOROTHIAZIDE 20-12.5 MG PO TABS: 1.0000 | ORAL_TABLET | Freq: Every day | ORAL | Status: DC

## 2020-01-13 MED ORDER — ASPIRIN EC 81 MG PO TBEC
81.0000 mg | DELAYED_RELEASE_TABLET | Freq: Every day | ORAL | Status: DC
  Administered 2020-01-13 – 2020-01-15 (×3): 81 mg via ORAL
  Filled 2020-01-13 (×3): qty 1

## 2020-01-13 MED ORDER — HYDROCHLOROTHIAZIDE 12.5 MG PO CAPS: 12.5000 mg | ORAL_CAPSULE | Freq: Every day | ORAL | Status: DC

## 2020-01-13 MED ORDER — LISINOPRIL 20 MG PO TABS
20.0000 mg | ORAL_TABLET | Freq: Every day | ORAL | Status: DC
  Administered 2020-01-13 – 2020-01-15 (×3): 20 mg via ORAL
  Filled 2020-01-13 (×3): qty 1

## 2020-01-13 MED ORDER — ENSURE MAX PROTEIN PO LIQD
11.0000 [oz_av] | Freq: Two times a day (BID) | ORAL | Status: DC
  Filled 2020-01-13 (×7): qty 330

## 2020-01-13 MED ORDER — HYDROCHLOROTHIAZIDE 10 MG/ML ORAL SUSPENSION
12.5000 mg | Freq: Every day | ORAL | Status: DC
  Administered 2020-01-13 – 2020-01-15 (×3): 13 mg
  Filled 2020-01-13 (×4): qty 1.88

## 2020-01-13 NOTE — Plan of Care (Signed)

## 2020-01-13 NOTE — Progress Notes (Signed)
STROKE TEAM PROGRESS NOTE   INTERVAL HISTORY Her RN is at the bedside.  Patient stated that her RUE much improved but RLE still numb, can not feel. However, she want to go to bathroom to urinate and I asked PT/OT to help her.   OBJECTIVE Vitals:   01/13/20 0800 01/13/20 0843 01/13/20 0900 01/13/20 1000  BP: (!) 137/92  (!) 125/97 (!) 144/98  Pulse: 86  78 (!) 104  Resp: 19  20 (!) 21  Temp: 97.9 F (36.6 C)     TempSrc: Oral     SpO2: 97% 100% 97% 99%  Weight:        CBC:  Recent Labs  Lab 01/11/20 2118  WBC 7.4  NEUTROABS 4.3  HGB 10.1*  HCT 34.3*  MCV 77.4*  PLT 278    Basic Metabolic Panel:  Recent Labs  Lab 01/11/20 2118  NA 138  K 4.3  CL 101  CO2 25  GLUCOSE 175*  BUN 16  CREATININE 1.05*  CALCIUM 9.4    Lipid Panel:     Component Value Date/Time   CHOL 161 01/12/2020 1107   TRIG 82 01/12/2020 1107   HDL 69 01/12/2020 1107   CHOLHDL 2.3 01/12/2020 1107   VLDL 16 01/12/2020 1107   LDLCALC 76 01/12/2020 1107   HgbA1c:  Lab Results  Component Value Date   HGBA1C 8.7 (H) 01/12/2020   Urine Drug Screen:     Component Value Date/Time   LABOPIA NONE DETECTED 01/11/2020 2242   COCAINSCRNUR NONE DETECTED 01/11/2020 2242   LABBENZ NONE DETECTED 01/11/2020 2242   AMPHETMU NONE DETECTED 01/11/2020 2242   THCU NONE DETECTED 01/11/2020 2242   LABBARB NONE DETECTED 01/11/2020 2242    Alcohol Level     Component Value Date/Time   ETH <10 01/11/2020 2118    IMAGING  MR BRAIN WO CONTRAST 01/12/2020 IMPRESSION: Normal MRI of the brain.    CT ANGIO HEAD CODE STROKE CT ANGIO NECK CODE STROKE 01/12/2020 IMPRESSION:  Normal CTA of the head and neck.  CT HEAD CODE STROKE WO CONTRAST 01/11/20 IMPRESSION: 1. Normal head CT as seen previously.  2. ASPECTS is 10   Transthoracic Echocardiogram  09/29/2019 IMPRESSIONS: 1. Left ventricular ejection fraction, by estimation, is 50 to 55%. The  left ventricle has low normal function. The left ventricle  has no regional  wall motion abnormalities. There is mild left ventricular hypertrophy.  Left ventricular diastolic  parameters are consistent with Grade II diastolic dysfunction  (pseudonormalization).  2. Right ventricular systolic function is normal. The right ventricular  size is normal.  3. The mitral valve is normal in structure. No evidence of mitral valve  regurgitation. No evidence of mitral stenosis.  4. The aortic valve is normal in structure. Aortic valve regurgitation is  not visualized. No aortic stenosis is present.   ECG - SR rate 93 BPM. (See cardiology reading for complete details)  PHYSICAL EXAM  Temp:  [97.9 F (36.6 C)-98.8 F (37.1 C)] 97.9 F (36.6 C) (08/23 0800) Pulse Rate:  [78-135] 104 (08/23 1000) Resp:  [0-27] 21 (08/23 1000) BP: (124-171)/(82-127) 144/98 (08/23 1000) SpO2:  [90 %-100 %] 99 % (08/23 1000)  General - Well nourished, well developed, not in acute distress  Ophthalmologic - fundi not visualized due to noncooperation.  Cardiovascular - Regular rhythm and rate.  Mental Status -  Level of arousal and orientation to time, place, and person were intact. Language including expression, naming, repetition, comprehension was assessed and found  intact.  Cranial Nerves II - XII - II - Visual field intact OU. III, IV, VI - Extraocular movements intact. V - Facial sensation decreased at right subjectively. VII - Facial movement intact bilaterally. VIII - Hearing & vestibular intact bilaterally. X - Palate elevates symmetrically. XI - Chin turning & shoulder shrug intact bilaterally. XII - Tongue protrusion intact.  Motor Strength - The patient's strength was normal in LUE and LLE and RUE, no moving on request of RLE. Positive Hoover signs on right lower extremity. Bulk was normal and fasciculations were absent.   Motor Tone - Muscle tone was assessed at the neck and appendages and was normal.  Reflexes - The patient's reflexes were  symmetrical in all extremities and she had no pathological reflexes.  Sensory - Light touch, temperature/pinprick were assessed and were decreased on the right subjectively.  Coordination - The patient had normal movements in the left hand and foot with no ataxia or dysmetria.  Tremor was absent.  Gait and Station - deferred.    ASSESSMENT/PLAN [name] is a 54 y.o. female with history of HTN, DM2, OSA, asthma, Schizophrenia, HLD, Bipolar who was seen by teleneurology at [facility] for acute R sided numbness and weakness. CTH was negative so she was given tPA and CTA/P attempted but unable due to blown IV. The pt was tx'd to [facility]. Hx of similar symptoms of focal neurological deficit s/p tPA with the pattern of weakness thought to be non organic. Noted to be moving her R arm and leg when team members are not in the room. MRI negative for stroke.  tPA Sat 8/21 at 2100  Conversion disorder s/p tPA  CT Head - normal. ASPECTS 10  MRI head - normal  CTA H&N - normal  2D Echo - 09/29/19 - EF 50-55%. No cardiac source of emboli identified.   Sars Corona Virus 2 - negative  LDL - 76  HgbA1c - 8.7   UDS - negative  VTE prophylaxis - SCDs  No antithrombotic prior to admission, now on ASA 81mg . Continue on discharge for primary stroke prevention.  Therapy recommendations:  Pending  Disposition:  Pending  Patient did admit that she has stress due to her landlord is selling the house and she has to move out.  Hx of conversion disorder   09/2019 admitted for right sided weakness status post TPA.  CT negative, CT head and neck negative, MRI no acute stroke.  EF 50 to 55%, LDL 86, A1c 14.1, discharged with aspirin and Lipitor 40  11/2019- presented with again right-sided weakness and numbness, exam inconsistent, MRI negative for stroke.  Concerning for conversion disorder at that time.  Hypertension  Home BP meds: Norvasc ; Zestoretic ; Propanolol  Current BP meds: Home  medication resumed  BP stable now  On Norvasc 10, propranolol 20 twice daily, HCTZ 12.5 and lisinopril 20 daily . Long-term BP goal normotensive  Hyperlipidemia  Home Lipid lowering medication: Lipitor 40 mg daily  LDL 78, goal < 100  Current lipid lowering medication: Resumed Lipitor 40  Continue statin at discharge  Diabetes  Home diabetic meds: insulin ; Janumt   Current diabetic meds: Tradjenta  HgbA1c 8.7, goal < 7.0  SSI  CBG monitoring  Close PCP follow-up  Other Stroke Risk Factors  Obesity, Body mass index is 49.88 kg/m., recommend weight loss, diet and exercise as appropriate   Obstructive sleep apnea  Other Active Problems  Code status - Full code   Psychiatric  disorder on Lamictal, Resporal, trazodone, amitriptyline, Abilify  Hospital day # 1   Marvel Plan, MD PhD Stroke Neurology 01/13/2020 11:13 AM  To contact Stroke Continuity provider, please refer to [email or web address]. After hours, contact General Neurology

## 2020-01-13 NOTE — Progress Notes (Signed)
Inpatient Diabetes Program Recommendations  AACE/ADA: New Consensus Statement on Inpatient Glycemic Control (2015)  Target Ranges:  Prepandial:   less than 140 mg/dL      Peak postprandial:   less than 180 mg/dL (1-2 hours)      Critically ill patients:  140 - 180 mg/dL   Lab Results  Component Value Date   GLUCAP 163 (H) 01/13/2020   HGBA1C 8.7 (H) 01/12/2020    Review of Glycemic Control Results for Monique Holland, Monique Holland [facility] (MRN [id number]) as of 01/13/2020 11:35  Ref. Range 01/12/2020 16:42 01/12/2020 21:20 01/13/2020 07:49  Glucose-Capillary Latest Ref Range: 70 - 99 mg/dL 951 (H) 884 (H) 166 (H)   Diabetes history: Type 2 DM Outpatient Diabetes medications: Janumet 50-1000 mg BID, Lantus 60 units BID Current orders for Inpatient glycemic control: Novolog 0-20 units TID, Novolog 0-5 units QHS, Tradjenta 5 mg QD, Metformin 1000 mg BID  Inpatient Diabetes Program Recommendations:    Consider adding Lantus 12 units QD.  Thanks, Lujean Rave, MSN, RNC-OB Diabetes Coordinator [phone] (8a-5p)

## 2020-01-13 NOTE — TOC Initial Note (Signed)
Transition of Care [facility]) - Initial/Assessment Note    Patient Details  Name: Monique Holland MRN: [id number] Date of Birth: [birth year]  Transition [facility]) CM/SW Contact:    Glennon Mac, RN Phone Number: 01/13/2020, 4:35 PM  Clinical Narrative:  Monique Holland is a 54 y.o. female with PMH significant for HTN, DM2, HLD, Bipolar who was seen by teleneurology at [facility] for acute R sided numbness and weakness with a LKW of 1730. CTH was negative so she was given tPA and CTA/P attempted but unable due to blown IV. She was transferred to [facility] for CTA/P and evaluation for potential thrombectomy. H/o conversion d/o. Admitted 11/2019 for same thing.   PTA, pt independent with assistive devices; lives with spouse.  PT/OT Recommending HH follow up, but pt declines HH follow up.  She states she has all needed DME at home.                  Expected Discharge Plan: Home w Home Health Services Barriers to Discharge: Barriers Resolved   Patient Goals and CMS Choice        Expected Discharge Plan and Services Expected Discharge Plan: Home w Home Health Services   Discharge Planning Services: CM Consult   Living arrangements for the past 2 months: Single Family Home                           HH Arranged: Refused HH          Prior Living Arrangements/Services Living arrangements for the past 2 months: Single Family Home Lives with:: Spouse Patient language and need for interpreter reviewed:: Yes Do you feel safe going back to the place where you live?: Yes      Need for Family Participation in Patient Care: Yes (Comment) Care giver support system in place?: Yes (comment) Current home services: DME Criminal Activity/Legal Involvement Pertinent to Current Situation/Hospitalization: No - Comment as needed  Activities of Daily Living Home Assistive Devices/Equipment: Cane (specify quad or straight) (quad) ADL Screening (condition at time of admission) Patient's  cognitive ability adequate to safely complete daily activities?: Yes Is the patient deaf or have difficulty hearing?: No Does the patient have difficulty seeing, even when wearing glasses/contacts?: No Does the patient have difficulty concentrating, remembering, or making decisions?: No Patient able to express need for assistance with ADLs?: Yes Does the patient have difficulty dressing or bathing?: No Independently performs ADLs?: Yes (appropriate for developmental age) Does the patient have difficulty walking or climbing stairs?: Yes Weakness of Legs: Both Weakness of Arms/Hands: Right  Permission Sought/Granted                  Emotional Assessment Appearance:: Appears stated age Attitude/Demeanor/Rapport: Engaged Affect (typically observed): Appropriate Orientation: : Oriented to Self, Oriented to Place, Oriented to  Time, Oriented to Situation      Admission diagnosis:  Right sided weakness [R53.1] Cerebral infarction, unspecified mechanism (HCC) [I63.9] Right sided numbness [R20.0] Received tissue plasminogen activator (tPA) less than 24 hours prior to arrival [Z92.82] Patient Active Problem List   Diagnosis Date Noted  . Conversion disorder 01/13/2020  . Received tissue plasminogen activator (tPA) less than 24 hours prior to arrival 01/12/2020  . Acute bronchitis 11/13/2019  . Hyperglycemia due to diabetes mellitus (HCC) 11/13/2019  . DKA (diabetic ketoacidoses) (HCC) 11/13/2019  . Prolonged QT interval 10/17/2019  . Pancreatitis 10/16/2019  . Hyperlipidemia 10/01/2019  . OSA (  obstructive sleep apnea) 10/01/2019  . GERD (gastroesophageal reflux disease) 10/01/2019  . Right sided weakness 09/29/2019  . Stroke-like episode s/p tPA 09/29/2019  . History of pancreatitis   . Intractable abdominal pain 09/15/2019  . Hyperosmolar hyperglycemic state (HHS) (HCC) 09/15/2019  . Non-intractable vomiting   . Hematemesis with nausea   . Abdominal pain 08/03/2019  .  Essential hypertension 08/03/2019  . Pancreatic pseudocyst   . Nausea and vomiting in adult   . Gastritis and gastroduodenitis   . Schizophrenia (HCC) 09/02/2018  . Obesity, Class III, BMI 40-49.9 (morbid obesity) (HCC) 09/02/2018  . Iron deficiency 09/02/2018  . Symptomatic anemia 09/01/2018  . Hypokalemia 09/01/2018  . AKI (acute kidney injury) (HCC) 08/11/2018  . Hypotension 08/11/2018  . Type 2 diabetes mellitus (HCC) 08/11/2018  . Ureteral calculus, right 05/02/2013   PCP:  Knox Royalty, MD Pharmacy:   [id number] [place] [address], [facility] - [phone] [place] [address] Phone: [phone] Fax: [phone]     Social Determinants of Health (SDOH) Interventions    Readmission Risk Interventions Readmission Risk Prevention Plan 01/13/2020 10/01/2019 08/20/2019  Transportation Screening Complete Complete Complete  PCP or Specialist Appt within 5-7 Days - - -  Home Care Screening - - -  Medication Review (RN CM) - - -  Medication Review (RN Care Manager) Complete Complete Complete  PCP or Specialist appointment within 3-5 days of discharge Complete Complete Complete  HRI or Home Care Consult Patient refused Not Complete -  HRI or Home Care Consult Pt Refusal Comments - Unable to secure [facility] services; referral to OP rehab -  SW Recovery Care/Counseling Consult Patient refused Patient refused Complete  Palliative Care Screening Not Applicable Not Applicable Not Applicable  Skilled Nursing Facility Not Applicable Not Applicable Not Applicable  Some recent data might be hidden   Quintella Baton, RN, BSN  Trauma/Neuro ICU Case Manager [phone]

## 2020-01-13 NOTE — Plan of Care (Signed)

## 2020-01-13 NOTE — Progress Notes (Signed)
OT Cancellation Note  Patient Details Name: Margurette Brener MRN: [id number] DOB: 04-Jun-1965   Cancelled Treatment:    Reason Eval/Treat Not Completed: Active bedrest order  [facility] Abhinav Mayorquin, OT/L   Acute OT Clinical Specialist Acute Rehabilitation Services Pager [phone] Office [phone]  01/13/2020, 8:40 AM

## 2020-01-13 NOTE — Research (Signed)
Optimist[employer] Study  Patient: Monique Holland MRN: [id number] DOB: [birth year]  The focus of the OptimistMain Study is related to vital sign and neuro check frequency following tPA administration. The information sheet for the OptimistMain Study was read with the patient and copy provided. She has elected to participate in the study. She is aware that she will receive a 90 day follow-up phone call with additional questions as part of the study. The Patient Reported Experience Measures Questions were completed.   Cathrine Muster NP-C

## 2020-01-13 NOTE — Discharge Summary (Addendum)
Stroke Discharge Summary  Patient ID: Monique Holland   MRN: [id number]      DOB: 1966/03/24  Date of Admission: 01/11/2020 Date of Discharge: 01/15/2020  Attending Physician:  Marvel Plan, MD, Stroke MD Consultant(s):     DB RN Coordinator Patient's PCP:  Knox Royalty, MD  DISCHARGE DIAGNOSIS:  Principal Problem:   Conversion disorder s/p tPA  Active Problems:   Type 2 diabetes mellitus (HCC)   Schizophrenia (HCC)   Obesity, Class III, BMI 40-49.9 (morbid obesity) (HCC)   Essential hypertension   Hyperlipidemia   OSA (obstructive sleep apnea)    Allergies as of 01/15/2020   No Known Allergies     Medication List    STOP taking these medications   ondansetron 8 MG tablet Commonly known as: ZOFRAN     TAKE these medications   albuterol 108 (90 Base) MCG/ACT inhaler Commonly known as: VENTOLIN HFA Inhale 1-2 puffs into the lungs every 6 (six) hours as needed for wheezing or shortness of breath.   amitriptyline 100 MG tablet Commonly known as: ELAVIL Take 100 mg by mouth at bedtime.   amLODipine 10 MG tablet Commonly known as: NORVASC Take 10 mg by mouth daily.   ARIPiprazole 2 MG tablet Commonly known as: ABILIFY Take 2 mg by mouth daily.   aspirin 81 MG EC tablet Take 1 tablet (81 mg total) by mouth daily. Swallow whole. Start taking on: January 16, 2020   atorvastatin 40 MG tablet Commonly known as: LIPITOR Take 40 mg by mouth daily.   clonazePAM 0.5 MG tablet Commonly known as: KLONOPIN Take 0.5 mg by mouth daily.   diclofenac 75 MG EC tablet Commonly known as: VOLTAREN Take 75 mg by mouth 2 (two) times daily.   Janumet 50-1000 MG tablet Generic drug: sitaGLIPtin-metformin Take 1 tablet by mouth 2 (two) times daily.   lamoTRIgine 200 MG tablet Commonly known as: LAMICTAL Take 200 mg by mouth daily.   Lantus SoloStar 100 UNIT/ML Solostar Pen Generic drug: insulin glargine Inject 60 Units into the skin 2 (two) times daily.   Latuda 80  MG Tabs tablet Generic drug: lurasidone Take 80 mg by mouth at bedtime.   lisinopril-hydrochlorothiazide 20-12.5 MG tablet Commonly known as: ZESTORETIC Take 2 tablets by mouth daily.   ondansetron 4 MG disintegrating tablet Commonly known as: Zofran ODT 4mg  ODT q4 hours prn nausea/vomit What changed:   how much to take  how to take this  when to take this  reasons to take this  additional instructions   oxyCODONE 15 MG immediate release tablet Commonly known as: ROXICODONE Take 15 mg by mouth every 8 (eight) hours as needed for pain.   pantoprazole 40 MG tablet Commonly known as: PROTONIX TAKE 1 TABLET (40 MG TOTAL) BY MOUTH 2 (TWO) TIMES DAILY FOR 28 DAYS.   promethazine 6.25 MG/5ML syrup Commonly known as: PHENERGAN Take 5 mLs by mouth 2 (two) times daily as needed for nausea/vomiting.   propranolol 20 MG tablet Commonly known as: INDERAL Take 20 mg by mouth 2 (two) times daily.   risperiDONE 0.5 MG tablet Commonly known as: RISPERDAL Take 0.5 mg by mouth at bedtime.   Symbicort 160-4.5 MCG/ACT inhaler Generic drug: budesonide-formoterol Inhale 2 puffs into the lungs 2 (two) times daily.   traZODone 100 MG tablet Commonly known as: DESYREL Take 100 mg by mouth at bedtime.   triamcinolone cream 0.1 % Commonly known as: KENALOG Apply 1 application topically 3 (three) times daily  as needed (for rash).       LABORATORY STUDIES CBC    Component Value Date/Time   WBC 4.9 01/14/2020 0828   RBC 4.32 01/14/2020 0828   HGB 9.6 (L) 01/14/2020 0828   HCT 32.9 (L) 01/14/2020 0828   PLT 208 01/14/2020 0828   MCV 76.2 (L) 01/14/2020 0828   MCH 22.2 (L) 01/14/2020 0828   MCHC 29.2 (L) 01/14/2020 0828   RDW 14.9 01/14/2020 0828   LYMPHSABS 2.1 01/11/2020 2118   MONOABS 0.7 01/11/2020 2118   EOSABS 0.3 01/11/2020 2118   BASOSABS 0.0 01/11/2020 2118   CMP    Component Value Date/Time   NA 137 01/14/2020 0828   K 4.3 01/14/2020 0828   CL 99 01/14/2020  0828   CO2 27 01/14/2020 0828   GLUCOSE 201 (H) 01/14/2020 0828   BUN 13 01/14/2020 0828   BUN 14 11/11/2019 1053   CREATININE 0.86 01/14/2020 0828   CALCIUM 9.3 01/14/2020 0828   CALCIUM 11.5 (H) 09/03/2018 0935   PROT 6.9 01/11/2020 2118   ALBUMIN 3.9 01/11/2020 2118   AST 19 01/11/2020 2118   ALT 18 01/11/2020 2118   ALKPHOS 57 01/11/2020 2118   BILITOT 0.1 (L) 01/11/2020 2118   GFRNONAA >60 01/14/2020 0828   GFRAA >60 01/14/2020 0828   COAGS Lab Results  Component Value Date   INR 0.9 01/11/2020   INR 0.9 11/27/2019   INR 1.0 09/29/2019   Lipid Panel    Component Value Date/Time   CHOL 161 01/12/2020 1107   TRIG 82 01/12/2020 1107   HDL 69 01/12/2020 1107   CHOLHDL 2.3 01/12/2020 1107   VLDL 16 01/12/2020 1107   LDLCALC 76 01/12/2020 1107   HgbA1C  Lab Results  Component Value Date   HGBA1C 8.7 (H) 01/12/2020   Urinalysis    Component Value Date/Time   COLORURINE YELLOW 01/11/2020 2242   APPEARANCEUR CLEAR 01/11/2020 2242   LABSPEC 1.015 01/11/2020 2242   PHURINE 6.5 01/11/2020 2242   GLUCOSEU NEGATIVE 01/11/2020 2242   HGBUR NEGATIVE 01/11/2020 2242   BILIRUBINUR NEGATIVE 01/11/2020 2242   KETONESUR NEGATIVE 01/11/2020 2242   PROTEINUR NEGATIVE 01/11/2020 2242   UROBILINOGEN 0.2 02/12/2015 1348   NITRITE NEGATIVE 01/11/2020 2242   LEUKOCYTESUR NEGATIVE 01/11/2020 2242   Urine Drug Screen     Component Value Date/Time   LABOPIA NONE DETECTED 01/11/2020 2242   COCAINSCRNUR NONE DETECTED 01/11/2020 2242   LABBENZ NONE DETECTED 01/11/2020 2242   AMPHETMU NONE DETECTED 01/11/2020 2242   THCU NONE DETECTED 01/11/2020 2242   LABBARB NONE DETECTED 01/11/2020 2242    Alcohol Level    Component Value Date/Time   ETH <10 01/11/2020 2118     SIGNIFICANT DIAGNOSTIC STUDIES CT HEAD CODE STROKE WO CONTRAST 01/11/20 1. Normal head CT as seen previously.  2. ASPECTS is 10   CT ANGIO HEAD CODE STROKE CT ANGIO NECK CODE STROKE 01/12/2020 Normal CTA of  the head and neck.  MR BRAIN WO CONTRAST 01/12/2020 Normal MRI of the brain.    Transthoracic Echocardiogram  09/29/2019 1. Left ventricular ejection fraction, by estimation, is 50 to 55%. The left ventricle has low normal function. The left ventricle has no regional wall motion abnormalities. There is mild left ventricular hypertrophy. Left ventricular diastolic parameters are consistent with Grade II diastolic dysfunction (pseudonormalization).  2. Right ventricular systolic function is normal. The right ventricular size is normal.  3. The mitral valve is normal in structure. No evidence of mitral valve regurgitation.  No evidence of mitral stenosis.  4. The aortic valve is normal in structure. Aortic valve regurgitation is not visualized. No aortic stenosis is present.   ECG - SR rate 93 BPM. (See cardiology reading for complete details)     HISTORY OF PRESENT ILLNESS Monique Holland is a 54 y.o. female with PMH significant for HTN, DM2, HLD, Bipolar who was [facility] for acute R sided numbness and weakness with a LKW of 1730 on 01/12/2020 . Baseline mRS of 0. CTH was negative so she was given tPA and CTA/P attempted but unable due to blown IV. [facility]/P and evaluation for potential thrombectomy.   Patient was sitting with her friend's and reports she was chilling when this started. Has had prior hx of similar symptoms of focal neurological deficit s/p tPA with the pattern of weakness thought to be non organic. Both times, her workup with MRI Brain was negative for an acute stroke. She denies any headaches. Noted to be moving her R arm and leg when team members are not in the room.   HOSPITAL COURSE [name] is a 54 y.o. female with history of HTN, DM2, OSA, asthma, Schizophrenia, HLD, Bipolarwho was [facility] acute R sided numbness and weakness. CTH was negative so she was  given tPA and CTA/P attempted but unable due to blown IV. [facility] Hx of similar symptoms of focal neurological deficit s/p tPA with the pattern of weakness thought to be non organic. Noted to be moving her R arm and leg when team members are not in the room. MRI negative for stroke.  tPA Sat 8/21 at 2100  Conversion disorder s/p tPA  CT Head - normal. ASPECTS 10  MRI head - normal  CTA H&N - normal  2D Echo - 09/29/19 - EF 50-55%. No cardiac source of emboli identified.   Sars Corona Virus 2 - negative  LDL - 76  HgbA1c - 8.7   UDS - negative  No antithrombotic prior to admission, now on ASA 81mg . Continue on discharge for primary stroke prevention.  Therapy recommendations:  HH PT, RW, 3N1  Disposition:  return home   Patient did admit that she has stress due to her landlord is selling the house and she has to move out.  Hx of conversion disorder   09/2019 admitted for right sided weakness status post TPA.  CT negative, CT head and neck negative, MRI no acute stroke.  EF 50 to 55%, LDL 86, A1c 14.1, discharged with aspirin and Lipitor 40  11/2019- presented with again right-sided weakness and numbness, exam inconsistent, MRI negative for stroke.  Concerning for conversion disorder at that time.  Hypertension  Home BP meds: Norvasc ; Zestoretic ; Propanolol  Current BP meds: Home medication resumed  BP stable now  On Norvasc 10, propranolol 20 twice daily, HCTZ 12.5 and lisinopril 20 daily  Long-term BP goal normotensive  Hyperlipidemia  Home Lipid lowering medication: Lipitor 40 mg daily  LDL 78, goal < 100  Current lipid lowering medication: Resumed Lipitor 40  Continue statin at discharge  Diabetes type II, uncontrolled  Home diabetic meds: insulin ; Janumt   Current diabetic meds: Tradjenta  HgbA1c 8.7, goal < 7.0  CBG monitoring  Close PCP follow-up  Other Stroke Risk Factors  Morbid obesity, Body mass index is 49.88 kg/m.,  recommend weight loss, diet  and exercise as appropriate   Obstructive sleep apnea  Other Active Problems  Psychiatric disorder on Lamictal, Resporal, trazodone, amitriptyline, Abilify  DISCHARGE EXAM Blood pressure (!) 133/96, pulse 89, temperature 98.4 F (36.9 C), temperature source Oral, resp. rate 17, height 5\' 8"  (1.727 m), weight 124.3 kg, last menstrual period 03/23/2014, SpO2 96 %. General - Well nourished, well developed, not in acute distress  Ophthalmologic - fundi not visualized due to noncooperation.  Cardiovascular - Regular rhythm and rate.  Mental Status -  Level of arousal and orientation to time, place, and person were intact. Language including expression, naming, repetition, comprehension was assessed and found intact.  Cranial Nerves II - XII - II - Visual field intact OU. III, IV, VI - Extraocular movements intact. V - Facial sensation decreased at right subjectively. VII - Facial movement intact bilaterally. VIII - Hearing & vestibular intact bilaterally. X - Palate elevates symmetrically. XI - Chin turning & shoulder shrug intact bilaterally. XII - Tongue protrusion intact.  Motor Strength - The patient's strength was normal in LUE and LLE and RUE, RLE 4/5 iliopsoas, and knee flexion, 3/5 foot DF and PF. Positive Hoover signs on right lower extremity. Bulk was normal and fasciculations were absent.   Motor Tone - Muscle tone was assessed at the neck and appendages and was normal.  Reflexes - The patient's reflexes were symmetrical in all extremities and she had no pathological reflexes.  Sensory - Light touch, temperature/pinprick were assessed and were decreased on the right subjectively.  Coordination - The patient had normal movements in the left hand and foot with no ataxia or dysmetria.  Tremor was absent.  Gait and Station - deferred.  Discharge Diet   Heart healthy / carb modified thin liquids  DISCHARGE PLAN  Disposition:  Return  home  Home health PT, RW, 3N1  Continue aspirin 81 mg daily for primary stroke prevention   Ongoing stroke risk factor control by Primary Care Physician at time of discharge  Follow-up PCP 13/05/2013, MD in 2 weeks.  Follow-up with psych  35 minutes were spent preparing discharge.  Knox Royalty, MD PhD Stroke Neurology 01/15/2020 6:13 PM

## 2020-01-13 NOTE — Evaluation (Signed)
Physical Therapy Evaluation Patient Details Name: Monique Holland MRN: [id number] DOB: Sep 27, 1965 Today's Date: 01/13/2020   History of Present Illness  Monique Holland is a 54 y.o. female with PMH significant for HTN, DM2, HLD, Bipolar who was seen by teleneurology at [facility] for acute R sided numbness and weakness with a LKW of 1730. CTH was negative so she was given tPA and CTA/P attempted but unable due to blown IV. She was transferred to [facility] for CTA/P and evaluation for potential thrombectomy. H/o conversion d/o. Admitted 11/2019 for same thing.    Clinical Impression  Pt admitted with above. Pt reports severe R LE numbness and uses bilat UEs to assist with R LE movement in bed however pt able to stand and ambulate to the bathroom without R knee buckling. Pt with decreased step height and increased bilat UE dependence on RW. Pt reports using cane PTA and mod I with all ADLs and mobility. At this time recommending HHPT and RW as pt not back to baseline however suspect pt to continue to progress towards independence and may not need HHPT or RW. Acute PT to cont to follow and reassess d/c recommendations and DME needs.    Follow Up Recommendations Home health PT;Supervision/Assistance - 24 hour (may progress and not need)    Equipment Recommendations  Rolling walker with 5" wheels (may not need)    Recommendations for Other Services       Precautions / Restrictions Precautions Precautions: Fall Precaution Comments: h/o conversion disorder, unsure of truth regarding R sided weakness Restrictions Weight Bearing Restrictions: No      Mobility  Bed Mobility Overal bed mobility: Modified Independent             General bed mobility comments: pt able to bring R LE off EOB without assist  Transfers Overall transfer level: Needs assistance Equipment used: Rolling walker (2 wheeled) Transfers: Sit to/from [employer] Sit to Stand: Min guard;+2  safety/equipment Stand pivot transfers: Min guard;+2 safety/equipment       General transfer comment: min guard for safety due to inconsisitent report of symptoms and presentation vs function  Ambulation/Gait Ambulation/Gait assistance: Min assist;+2 safety/equipment Gait Distance (Feet): 10 Feet (x2, to/from bathroom) Assistive device: Rolling walker (2 wheeled) Gait Pattern/deviations: Step-through pattern;Decreased stride length;Decreased step length - right;Decreased stance time - left;Decreased dorsiflexion - right Gait velocity: slow Gait velocity interpretation: <1.31 ft/sec, indicative of household ambulator General Gait Details: max verbal cues to pick up and clear R foot, very dependent on UEs on RW however was then able to balance on R LE and bilat UEs without buckling to reach down and pick something up off floor, no buckling of knees during ambulation, minA for walker management and sequencing  Stairs            Wheelchair Mobility    Modified Rankin (Stroke Patients Only) Modified Rankin (Stroke Patients Only) Pre-Morbid Rankin Score: Slight disability Modified Rankin: Moderate disability     Balance Overall balance assessment: Needs assistance   Sitting balance-Leahy Scale: Good     Standing balance support: Bilateral upper extremity supported;No upper extremity supported Standing balance-Leahy Scale: Fair Standing balance comment: able to release RW and complete pericare ; reaching toward floor to pick up towel wihtout any LOB                             Pertinent Vitals/Pain Pain Assessment: No/denies pain  Home Living Family/patient expects to be discharged to:: Private residence Living Arrangements: Spouse/significant other Available Help at Discharge: Family;Available 24 hours/day (spouse can be 24/7, son works) Type of Home: Apartment Home Access: Level entry     Home Layout: One level Home Equipment: Cane - single point;Tub  bench;Bedside commode Additional Comments: lives with her son, he is not available 24/7    Prior Function Level of Independence: Independent with assistive device(s)         Comments: was driving, indep with ADLs, uses cane to walk with, wasn't using tub bench, has BSC over toliet     Hand Dominance   Dominant Hand: Right    Extremity/Trunk Assessment   Upper Extremity Assessment Upper Extremity Assessment: Defer to OT evaluation RUE Deficits / Details: Using RUE functionally without difficulty; on MMT, pt with inconsistent response; Pt abl eto push self up form bed/toilet using RUE however has difficulty extending against gravity on MMT RUE Sensation:  (feels a little funny")    Lower Extremity Assessment Lower Extremity Assessment: RLE deficits/detail RLE Deficits / Details: appears grossly 3/5 however quesitonable effort RLE Sensation: decreased light touch (reports severe numbness)    Cervical / Trunk Assessment Cervical / Trunk Assessment: Normal  Communication   Communication: No difficulties  Cognition Arousal/Alertness: Awake/alert Behavior During Therapy: WFL for tasks assessed/performed Overall Cognitive Status: Within Functional Limits for tasks assessed                                 General Comments: pt with known psych history of bipolar and schizophrenia, reports of conversion d/o      General Comments General comments (skin integrity, edema, etc.): VSS    Exercises     Assessment/Plan    PT Assessment Patient needs continued PT services  PT Problem List Decreased strength;Decreased range of motion;Decreased balance;Decreased activity tolerance;Decreased mobility;Decreased coordination;Decreased cognition       PT Treatment Interventions DME instruction;Gait training;Functional mobility training;Therapeutic activities;Therapeutic exercise;Balance training;Neuromuscular re-education;Cognitive remediation    PT Goals (Current goals  can be found in the Care Plan section)  Acute Rehab PT Goals Patient Stated Goal: for her leg to not be numb PT Goal Formulation: With patient Time For Goal Achievement: 01/27/20 Potential to Achieve Goals: Good    Frequency Min 4X/week   Barriers to discharge        Co-evaluation PT/OT/SLP Co-Evaluation/Treatment: Yes Reason for Co-Treatment: To address functional/ADL transfers;Necessary to address cognition/behavior during functional activity PT goals addressed during session: Mobility/safety with mobility OT goals addressed during session: ADL's and self-care       AM-PAC PT "6 Clicks" Mobility  Outcome Measure Help needed turning from your back to your side while in a flat bed without using bedrails?: None Help needed moving from lying on your back to sitting on the side of a flat bed without using bedrails?: None Help needed moving to and from a bed to a chair (including a wheelchair)?: None Help needed standing up from a chair using your arms (e.g., wheelchair or bedside chair)?: A Little Help needed to walk in hospital room?: A Little Help needed climbing 3-5 steps with a railing? : A Little 6 Click Score: 21    End of Session Equipment Utilized During Treatment: Gait belt Activity Tolerance: Patient tolerated treatment well Patient left: in chair;with call bell/phone within reach Nurse Communication: Mobility status PT Visit Diagnosis: Unsteadiness on feet (R26.81);Difficulty in walking, not elsewhere classified (  R26.2)    Time: 0539-7673 PT Time Calculation (min) (ACUTE ONLY): 39 min   Charges:   PT Evaluation $PT Eval Moderate Complexity: 1 Mod PT Treatments $Gait Training: 8-22 mins        Lewis Shock, PT, DPT Acute Rehabilitation Services Pager #: [phone] Office #: [phone]   Iona Hansen 01/13/2020, 11:43 AM

## 2020-01-13 NOTE — Progress Notes (Signed)
Occupational Therapy Evaluation Patient Details Name: Monique Holland MRN: [id number] DOB: 06-Apr-1966 Today's Date: 01/13/2020    History of Present Illness Monique Holland is a 54 y.o. female with PMH significant for HTN, DM2, HLD, Bipolar who was seen by teleneurology at [facility] for acute R sided numbness and weakness with a LKW of 1730. CTH was negative so she was given tPA and CTA/P attempted but unable due to blown IV. She was transferred to [facility] for CTA/P and evaluation for potential thrombectomy. H/o conversion d/o. Admitted 11/2019 for same thing.   Clinical Impression   Familiar with pt from previous admissions. PTA, pt modified independent with ADL, IADL tasks and mobility @ cane level, including medication management and driving. Pt reports RUE "feels better but R leg is numb". Inconsistent on MMT however using RUE functionally without difficulty.Mobilized to bathroom for toilet transfer, pericare and sink level ADL with minguard. Will follow acutely but do not anticipate need for follow up OT after DC.     Follow Up Recommendations  No OT follow up;Supervision - Intermittent    Equipment Recommendations  None recommended by OT    Recommendations for Other Services Other (comment) (psych)     Precautions / Restrictions Precautions Precautions: Fall Precaution Comments: h/o conversion disorder, unsure of truth regarding R sided weakness Restrictions Weight Bearing Restrictions: No      Mobility Bed Mobility Overal bed mobility: Modified Independent                Transfers Overall transfer level: Needs assistance Equipment used: Rolling walker (2 wheeled) Transfers: Sit to/from [employer] Sit to Stand: Min guard;+2 safety/equipment Stand pivot transfers: Min guard;+2 safety/equipment            Balance Overall balance assessment: Needs assistance   Sitting balance-Leahy Scale: Good       Standing balance-Leahy Scale:  Fair Standing balance comment: able to release RW and complete pericare ; reaching toward floor to pick up towel wihtout any LOB                           ADL either performed or assessed with clinical judgement   ADL Overall ADL's : Needs assistance/impaired     Grooming: Set up;Standing;Supervision/safety   Upper Body Bathing: Set up;Sitting   Lower Body Bathing: Cueing for safety;Min guard   Upper Body Dressing : Set up;Sitting   Lower Body Dressing: Min guard;Sit to/from stand   Toilet Transfer: Min guard;RW;Ambulation;+2 for safety/equipment   Toileting- Architect and Hygiene: Supervision/safety;Sit to/from stand       Functional mobility during ADLs: +2 for safety/equipment;Min guard;Rolling walker;Cueing for safety       Vision Baseline Vision/History: Wears glasses Wears Glasses: Reading only Vision Assessment?: No apparent visual deficits     Perception Perception Perception Tested?:  (no apparent deficits)   Praxis Praxis Praxis tested?: Within functional limits    Pertinent Vitals/Pain Pain Assessment: No/denies pain     Hand Dominance Right   Extremity/Trunk Assessment Upper Extremity Assessment Upper Extremity Assessment: RUE deficits/detail RUE Deficits / Details: Using RUE functionally without difficulty; on MMT, pt with inconsistent response; Pt abl eto push self up form bed/toilet using RUE however has difficulty extending against gravity on MMT RUE Sensation:  (feels a little funny")   Lower Extremity Assessment Lower Extremity Assessment: RLE deficits/detail RLE Deficits / Details: appears grossly 3/5 however quesitonable effort RLE Sensation: decreased light touch (reports severe  numbness)   Cervical / Trunk Assessment Cervical / Trunk Assessment: Normal   Communication Communication Communication: No difficulties   Cognition Arousal/Alertness: Awake/alert Behavior During Therapy: WFL for tasks  assessed/performed Overall Cognitive Status: Within Functional Limits for tasks assessed                                 General Comments: pt with known psych history of bipolar and schizophrenia, reports of conversion d/o   General Comments       Exercises     Shoulder Instructions      Home Living Family/patient expects to be discharged to:: Private residence Living Arrangements: Spouse/significant other Available Help at Discharge: Family;Available 24 hours/day (spouse can be 24/7, son works) Type of Home: Apartment Home Access: Level entry     Home Layout: One level     Bathroom Shower/Tub: Chief Strategy Officer: Handicapped height (uses BSC over [employer]) Bathroom Accessibility: Yes How Accessible: Accessible via walker Home Equipment: Cane - single point;Tub bench;Bedside commode          Prior Functioning/Environment Level of Independence: Independent with assistive device(s)        Comments: was driving, indep with ADLs, uses cane to walk with, wasn't using tub bench, has BSC over toliet        OT Problem List: Decreased strength;Impaired balance (sitting and/or standing);Decreased safety awareness;Decreased knowledge of use of DME or AE;Obesity      OT Treatment/Interventions: Self-care/ADL training;Therapeutic exercise;Neuromuscular education;DME and/or AE instruction;Therapeutic activities;Balance training;Patient/family education    OT Goals(Current goals can be found in the care plan section) Acute Rehab OT Goals Patient Stated Goal: for her leg to not be numb OT Goal Formulation: With patient Time For Goal Achievement: 01/27/20 Potential to Achieve Goals: Good  OT Frequency: Min 2X/week   Barriers to D/C:            Co-evaluation PT/OT/SLP Co-Evaluation/Treatment: Yes Reason for Co-Treatment: To address functional/ADL transfers;For patient/therapist safety PT goals addressed during session: Mobility/safety with  mobility (pt with inconsistent presentation/effort) OT goals addressed during session: ADL's and self-care      AM-PAC OT "6 Clicks" Daily Activity     Outcome Measure Help from another person eating meals?: None Help from another person taking care of personal grooming?: A Little Help from another person toileting, which includes using toliet, bedpan, or urinal?: A Little Help from another person bathing (including washing, rinsing, drying)?: A Little Help from another person to put on and taking off regular upper body clothing?: A Little Help from another person to put on and taking off regular lower body clothing?: A Little 6 Click Score: 19   End of Session Equipment Utilized During Treatment: Rolling walker Nurse Communication: Mobility status  Activity Tolerance: Patient tolerated treatment well Patient left: in chair;with call bell/phone within reach;with chair alarm set  OT Visit Diagnosis: Unsteadiness on feet (R26.81);Muscle weakness (generalized) (M62.81)                Time: 6606-3016 OT Time Calculation (min): 27 min Charges:  OT General Charges $OT Visit: 1 Visit OT Evaluation $OT Eval Moderate Complexity: 1 Mod  Kalifa Cadden, OT/L   Acute OT Clinical Specialist Acute Rehabilitation Services Pager [phone] Office [phone]   [facility] 01/13/2020, 10:34 AM

## 2020-01-13 NOTE — Progress Notes (Signed)
Initial Nutrition Assessment  RD working remotely.  DOCUMENTATION CODES:   Morbid obesity  INTERVENTION:   - Ensure Max po BID, each supplement provides 150 kcal and 30 grams of protein  NUTRITION DIAGNOSIS:   Increased nutrient needs related to acute illness as evidenced by estimated needs.  GOAL:   Patient will meet greater than or equal to 90% of their needs  MONITOR:   PO intake, Supplement acceptance, Labs, Weight trends  REASON FOR ASSESSMENT:   Malnutrition Screening Tool    ASSESSMENT:   54 year old female who presented to the ED on 8/22 with left-sided weakness. PMH of HTN, T2DM, HLD, bipolar disorder, schizophrenia, OSA. MRI negative for stroke. Pt received tPA on 8/21. Pt admitted with conversion disorder.   Pt on a Heart Healthy/Carb Modified diet with 100% meal completions x 3 meals.  Pt reporting unintentional weight loss per MST report. Per review of weight records in chart, pt with recent weight gain and current weight consistent with weight from March 2020. Unable to obtain more information from pt at this time related to weight changes.  Will order oral nutrition supplements to aid pt in meeting kcal and protein needs during admission.  Medications reviewed and include: SSI, tradjenta, metformin, protonix  Labs reviewed. CBG's: 163-240 x 24 hours  NUTRITION - FOCUSED PHYSICAL EXAM:  Unable to complete at this time. RD working remotely.  Diet Order:   Diet Order            Diet heart healthy/carb modified Room service appropriate? Yes with Assist; Fluid consistency: Thin  Diet effective now                 EDUCATION NEEDS:   No education needs have been identified at this time  Skin:  Skin Assessment: Reviewed RN Assessment  Last BM:  no documented BM  Height:   Ht Readings from Last 1 Encounters:  12/19/19 5\' 8"  (1.727 m)    Weight:   Wt Readings from Last 1 Encounters:  01/11/20 (!) 148.8 kg    Ideal Body Weight:  63.6  kg  BMI:  Body mass index is 49.88 kg/m.  Estimated Nutritional Needs:   Kcal:  2100-2300  Protein:  100-115 grams  Fluid:  >/= 2.0 L    01/13/20, MS, RD, LDN Inpatient Clinical Dietitian Please see AMiON for contact information.

## 2020-01-14 DIAGNOSIS — E1169 Type 2 diabetes mellitus with other specified complication: Secondary | ICD-10-CM

## 2020-01-14 DIAGNOSIS — E785 Hyperlipidemia, unspecified: Secondary | ICD-10-CM

## 2020-01-14 DIAGNOSIS — F209 Schizophrenia, unspecified: Secondary | ICD-10-CM

## 2020-01-14 DIAGNOSIS — G4733 Obstructive sleep apnea (adult) (pediatric): Secondary | ICD-10-CM

## 2020-01-14 DIAGNOSIS — Z794 Long term (current) use of insulin: Secondary | ICD-10-CM

## 2020-01-14 LAB — BASIC METABOLIC PANEL
Anion gap: 11 (ref 5–15)
BUN: 13 mg/dL (ref 6–20)
CO2: 27 mmol/L (ref 22–32)
Calcium: 9.3 mg/dL (ref 8.9–10.3)
Chloride: 99 mmol/L (ref 98–111)
Creatinine, Ser: 0.86 mg/dL (ref 0.44–1.00)
GFR calc Af Amer: >60 mL/min (ref >60)
GFR calc non Af Amer: >60 mL/min (ref >60)
Glucose, Bld: 201 mg/dL — ABNORMAL HIGH (ref 70–99)
Potassium: 4.3 mmol/L (ref 3.5–5.1)
Sodium: 137 mmol/L (ref 135–145)

## 2020-01-14 LAB — GLUCOSE, CAPILLARY
Glucose-Capillary: 186 mg/dL — ABNORMAL HIGH (ref 70–99)
Glucose-Capillary: 209 mg/dL — ABNORMAL HIGH (ref 70–99)
Glucose-Capillary: 155 mg/dL — ABNORMAL HIGH (ref 70–99)
Glucose-Capillary: 176 mg/dL — ABNORMAL HIGH (ref 70–99)

## 2020-01-14 LAB — CBC
HCT: 32.9 % — ABNORMAL LOW (ref 36.0–46.0)
Hemoglobin: 9.6 g/dL — ABNORMAL LOW (ref 12.0–15.0)
MCH: 22.2 pg — ABNORMAL LOW (ref 26.0–34.0)
MCHC: 29.2 g/dL — ABNORMAL LOW (ref 30.0–36.0)
MCV: 76.2 fL — ABNORMAL LOW (ref 80.0–100.0)
Platelets: 208 10*3/uL (ref 150–400)
RBC: 4.32 MIL/uL (ref 3.87–5.11)
RDW: 14.9 % (ref 11.5–15.5)
WBC: 4.9 10*3/uL (ref 4.0–10.5)
nRBC: 0 % (ref 0.0–0.2)

## 2020-01-14 NOTE — Progress Notes (Signed)
STROKE TEAM PROGRESS NOTE   INTERVAL HISTORY Her RN is at the bedside. Pt stated that she fell 4am when she got up to go to bathroom. She stated that her right leg gave out on her. She said she called RN and night shift RN knew. However, this morning RN did not get any sign out for that and there was no incident report for that last night.   OBJECTIVE Vitals:   01/13/20 2012 01/13/20 2043 01/14/20 0234 01/14/20 0813  BP: 113/63  124/77 118/72  Pulse: 91 87 90 91  Resp: 17 16 17 17   Temp: 98.3 F (36.8 C)  98.4 F (36.9 C) 98.3 F (36.8 C)  TempSrc: Oral  Oral Oral  SpO2: 98% 97%  94%  Weight:      Height:        CBC:  Recent Labs  Lab 01/11/20 2118 01/14/20 0828  WBC 7.4 4.9  NEUTROABS 4.3  --   HGB 10.1* 9.6*  HCT 34.3* 32.9*  MCV 77.4* 76.2*  PLT 278 208    Basic Metabolic Panel:  Recent Labs  Lab 01/11/20 2118 01/14/20 0828  NA 138 137  K 4.3 4.3  CL 101 99  CO2 25 27  GLUCOSE 175* 201*  BUN 16 13  CREATININE 1.05* 0.86  CALCIUM 9.4 9.3    Lipid Panel:     Component Value Date/Time   CHOL 161 01/12/2020 1107   TRIG 82 01/12/2020 1107   HDL 69 01/12/2020 1107   CHOLHDL 2.3 01/12/2020 1107   VLDL 16 01/12/2020 1107   LDLCALC 76 01/12/2020 1107   HgbA1c:  Lab Results  Component Value Date   HGBA1C 8.7 (H) 01/12/2020   Urine Drug Screen:     Component Value Date/Time   LABOPIA NONE DETECTED 01/11/2020 2242   COCAINSCRNUR NONE DETECTED 01/11/2020 2242   LABBENZ NONE DETECTED 01/11/2020 2242   AMPHETMU NONE DETECTED 01/11/2020 2242   THCU NONE DETECTED 01/11/2020 2242   LABBARB NONE DETECTED 01/11/2020 2242    Alcohol Level     Component Value Date/Time   ETH <10 01/11/2020 2118    IMAGING  MR BRAIN WO CONTRAST 01/12/2020 IMPRESSION: Normal MRI of the brain.    CT ANGIO HEAD CODE STROKE CT ANGIO NECK CODE STROKE 01/12/2020 IMPRESSION:  Normal CTA of the head and neck.  CT HEAD CODE STROKE WO CONTRAST 01/11/20 IMPRESSION: 1.  Normal head CT as seen previously.  2. ASPECTS is 10   Transthoracic Echocardiogram  09/29/2019 IMPRESSIONS: 1. Left ventricular ejection fraction, by estimation, is 50 to 55%. The  left ventricle has low normal function. The left ventricle has no regional  wall motion abnormalities. There is mild left ventricular hypertrophy.  Left ventricular diastolic  parameters are consistent with Grade II diastolic dysfunction  (pseudonormalization).  2. Right ventricular systolic function is normal. The right ventricular  size is normal.  3. The mitral valve is normal in structure. No evidence of mitral valve  regurgitation. No evidence of mitral stenosis.  4. The aortic valve is normal in structure. Aortic valve regurgitation is  not visualized. No aortic stenosis is present.   ECG - SR rate 93 BPM. (See cardiology reading for complete details)  PHYSICAL EXAM  Temp:  [98.2 F (36.8 C)-98.9 F (37.2 C)] 98.3 F (36.8 C) (08/24 0813) Pulse Rate:  [87-91] 91 (08/24 0813) Resp:  [0-28] 17 (08/24 0813) BP: (95-134)/(61-91) 118/72 (08/24 0813) SpO2:  [94 %-100 %] 94 % (08/24 0813) Weight:  [  124.3 kg] 124.3 kg (08/23 1814)  General - Well nourished, well developed, not in acute distress  Ophthalmologic - fundi not visualized due to noncooperation.  Cardiovascular - Regular rhythm and rate.  Mental Status -  Level of arousal and orientation to time, place, and person were intact. Language including expression, naming, repetition, comprehension was assessed and found intact.  Cranial Nerves II - XII - II - Visual field intact OU. III, IV, VI - Extraocular movements intact. V - Facial sensation decreased at right subjectively. VII - Facial movement intact bilaterally. VIII - Hearing & vestibular intact bilaterally. X - Palate elevates symmetrically. XI - Chin turning & shoulder shrug intact bilaterally. XII - Tongue protrusion intact.  Motor Strength - The patient's strength was  normal in LUE and LLE and RUE, RLE 3/5 iliopsoas, and knee flexion, 2/5 foot DF and PF. Positive Hoover signs on right lower extremity. Bulk was normal and fasciculations were absent.   Motor Tone - Muscle tone was assessed at the neck and appendages and was normal.  Reflexes - The patient's reflexes were symmetrical in all extremities and she had no pathological reflexes.  Sensory - Light touch, temperature/pinprick were assessed and were decreased on the right subjectively.  Coordination - The patient had normal movements in the left hand and foot with no ataxia or dysmetria.  Tremor was absent.  Gait and Station - deferred.    ASSESSMENT/PLAN [name] is a 54 y.o. female with history of HTN, DM2, OSA, asthma, Schizophrenia, HLD, Bipolar who was seen by teleneurology at [facility] for acute R sided numbness and weakness. CTH was negative so she was given tPA and CTA/P attempted but unable due to blown IV. The pt was tx'd to [facility]. Hx of similar symptoms of focal neurological deficit s/p tPA with the pattern of weakness thought to be non organic. Noted to be moving her R arm and leg when team members are not in the room. MRI negative for stroke.  tPA Sat 8/21 at 2100  Conversion disorder s/p tPA  CT Head - normal. ASPECTS 10  MRI head - normal  CTA H&N - normal  2D Echo - 09/29/19 - EF 50-55%. No cardiac source of emboli identified.   Sars Corona Virus 2 - negative  LDL - 76  HgbA1c - 8.7   UDS - negative  VTE prophylaxis - SCDs  No antithrombotic prior to admission, now on ASA 81mg . Continue on discharge for primary stroke prevention.  Therapy recommendations:  HH PT  Disposition:  Pending  Patient did admit that she has stress due to her landlord is selling the house and she has to move out.  ? Fall early morning  Pt report 4am fall when she got up to bathroom  Pt stated that night shift RN knew but there was no report  Will check with night shift RN  tonight  Recommended her to call for assistance when trying to out of bed  Hx of conversion disorder   09/2019 admitted for right sided weakness status post TPA.  CT negative, CT head and neck negative, MRI no acute stroke.  EF 50 to 55%, LDL 86, A1c 14.1, discharged with aspirin and Lipitor 40  11/2019- presented with again right-sided weakness and numbness, exam inconsistent, MRI negative for stroke.  Concerning for conversion disorder at that time.  Hypertension  Home BP meds: Norvasc ; Zestoretic ; Propanolol  Current BP meds: Home medication resumed  BP stable now  On  Norvasc 10, propranolol 20 twice daily, HCTZ 12.5 and lisinopril 20 daily . Long-term BP goal normotensive  Hyperlipidemia  Home Lipid lowering medication: Lipitor 40 mg daily  LDL 78, goal < 100  Current lipid lowering medication: Resumed Lipitor 40  Continue statin at discharge  Diabetes  Home diabetic meds: insulin ; Janumt   Current diabetic meds: Tradjenta  HgbA1c 8.7, goal < 7.0  SSI  CBG monitoring  Close PCP follow-up  Other Stroke Risk Factors  Obesity, Body mass index is 41.66 kg/m., recommend weight loss, diet and exercise as appropriate   Obstructive sleep apnea  Other Active Problems  Code status - Full code   Psychiatric disorder on Lamictal, Resporal, trazodone, amitriptyline, Abilify  Hospital day # 2   Marvel Plan, MD PhD Stroke Neurology 01/14/2020 10:39 AM  To contact Stroke Continuity provider, please refer to [email or web address]. After hours, contact General Neurology

## 2020-01-14 NOTE — TOC Progression Note (Signed)
Transition of Care [facility]) - Progression Note    Patient Details  Name: Monique Holland MRN: [id number] Date of Birth: 1966/04/16  Transition of Care [facility]) CM/SW [place], [place] Phone Number: 01/14/2020, 12:35 PM  Clinical Narrative:    CSW met with patient to discuss PT recommendation for [facility]. Patient expressed understanding and is in agreement to [facility] at discharge. Patient stated she has used [facility] in the past. Patient has a walker and shower chair. Patient expressed she has level home entry and no steps.   Expected Discharge Plan: [place] Barriers to Discharge: Barriers Resolved  Expected Discharge Plan and Services Expected Discharge Plan: [place]   Discharge Planning Services: CM Consult   Living arrangements for the past 2 months: Single Family Home                           HH Arranged: Refused HH           Social Determinants of Health (SDOH) Interventions    Readmission Risk Interventions Readmission Risk Prevention Plan 01/13/2020 10/01/2019 08/20/2019  Transportation Screening Complete Complete Complete  PCP or Specialist Appt within 5-7 Days - - -  Home Care Screening - - -  Medication Review (RN CM) - - -  Medication Review ([place]) Complete Complete Complete  PCP or Specialist appointment within 3-5 days of discharge Complete Complete Complete  HRI or Home Care Consult Patient refused Not Complete -  [place] or Home Care Consult Pt Refusal Comments - Unable to secure [facility] services; referral to OP rehab -  SW Recovery Care/Counseling Consult Patient refused Patient refused Complete  Palliative Care Screening Not Applicable Not Applicable Not [place] Not Applicable Not Applicable Not Applicable  Some recent data might be hidden

## 2020-01-14 NOTE — Plan of Care (Signed)

## 2020-01-14 NOTE — Progress Notes (Signed)
Physical Therapy Treatment Patient Details Name: Monique Holland MRN: [id number] DOB: [birth year] Today's Date: 01/14/2020    History of Present Illness Monique Holland is a 54 y.o. female with PMH significant for HTN, DM2, HLD, Bipolar who was seen by teleneurology at [facility] for acute R sided numbness and weakness with a LKW of 1730. CTH was negative so she was given tPA and CTA/P attempted but unable due to blown IV. She was transferred to [facility] for CTA/P and evaluation for potential thrombectomy. H/o conversion d/o. Admitted 11/2019 for same thing.    PT Comments    Pt progressing with mobility today despite relaying that she went down on her knees in the bathroom last night. She reports that she was able to get self up and back to bed. Min-guard A given today for safety. Pt ambulated 250' with UE reliance on RW and min-guard A. Chair brought behind for safety. Decreased R foot clearance and decreased wt shift to R with gait speed indicative of increased fall risk. Pt relays that she continues to have numbness R knee to foot. HR 101 bpm, SpO2 in low 90's on RA. PT will continue to follow.    Follow Up Recommendations  Home health PT;Supervision/Assistance - 24 hour     Equipment Recommendations  Rolling walker with 5" wheels    Recommendations for Other Services       Precautions / Restrictions Precautions Precautions: Fall Precaution Comments: h/o conversion disorder, unsure of truth regarding R sided weakness Restrictions Weight Bearing Restrictions: No    Mobility  Bed Mobility Overal bed mobility: Modified Independent             General bed mobility comments: pt able to bring R LE off EOB with self assist using hands  Transfers Overall transfer level: Needs assistance Equipment used: Rolling walker (2 wheeled) Transfers: Sit to/from Stand Sit to Stand: Min guard         General transfer comment: min-guard A for safety since pt reported fall last  night, no LOB, mild hesitation of putting wt on RLE but improved once she was ambulating  Ambulation/Gait Ambulation/Gait assistance: Min guard Gait Distance (Feet): 250 Feet Assistive device: Rolling walker (2 wheeled) Gait Pattern/deviations: Step-through pattern;Decreased stride length;Decreased step length - right;Decreased stance time - left;Decreased dorsiflexion - right Gait velocity: decreased Gait velocity interpretation: <1.8 ft/sec, indicate of risk for recurrent falls General Gait Details: chair brought behind pt but pt tolerated increased distance well with no noted buckling of RLE. Was reliant on RW with UE's and reported numbness knee to foot   Stairs             Wheelchair Mobility    Modified Rankin (Stroke Patients Only) Modified Rankin (Stroke Patients Only) Pre-Morbid Rankin Score: Slight disability Modified Rankin: Moderate disability     Balance Overall balance assessment: Needs assistance Sitting-balance support: No upper extremity supported Sitting balance-Leahy Scale: Good     Standing balance support: Bilateral upper extremity supported;No upper extremity supported Standing balance-Leahy Scale: Fair                              Cognition Arousal/Alertness: Awake/alert Behavior During Therapy: WFL for tasks assessed/performed Overall Cognitive Status: Within Functional Limits for tasks assessed  General Comments: pt with known psych history of bipolar and schizophrenia, reports of conversion d/o      Exercises General Exercises - Lower Extremity Ankle Circles/Pumps: AROM;Both;10 reps;Seated Long Arc Quad: AROM;Both;10 reps;Seated    General Comments General comments (skin integrity, edema, etc.): pt mentions that she is about to have to move from current apt into 2nd or 3rd level apt, not advised for pt      Pertinent Vitals/Pain Pain Assessment: No/denies pain    Home Living                       Prior Function            PT Goals (current goals can now be found in the care plan section) Acute Rehab PT Goals Patient Stated Goal: for her leg to not be numb PT Goal Formulation: With patient Time For Goal Achievement: 01/27/20 Potential to Achieve Goals: Good Progress towards PT goals: Progressing toward goals    Frequency    Min 4X/week      PT Plan Current plan remains appropriate    Holland-evaluation              AM-PAC PT "6 Clicks" Mobility   Outcome Measure  Help needed turning from your back to your side while in a flat bed without using bedrails?: None Help needed moving from lying on your back to sitting on the side of a flat bed without using bedrails?: None Help needed moving to and from a bed to a chair (including a wheelchair)?: A Little Help needed standing up from a chair using your arms (e.g., wheelchair or bedside chair)?: A Little Help needed to walk in hospital room?: A Little Help needed climbing 3-5 steps with a railing? : A Lot 6 Click Score: 19    End of Session Equipment Utilized During Treatment: Gait belt Activity Tolerance: Patient tolerated treatment well Patient left: with call bell/phone within reach;in bed;with bed alarm set Nurse Communication: Mobility status PT Visit Diagnosis: Unsteadiness on feet (R26.81);Difficulty in walking, not elsewhere classified (R26.2)     Time: 0630-1601 PT Time Calculation (min) (ACUTE ONLY): 28 min  Charges:  $Gait Training: 23-37 mins                     Monique Holland, PT  Acute Rehab Services  Pager [phone] Office [phone]    Monique Holland 01/14/2020, 4:06 PM

## 2020-01-15 LAB — GLUCOSE, CAPILLARY
Glucose-Capillary: 196 mg/dL — ABNORMAL HIGH (ref 70–99)
Glucose-Capillary: 240 mg/dL — ABNORMAL HIGH (ref 70–99)

## 2020-01-15 MED ORDER — ASPIRIN 81 MG PO TBEC: 81.0000 mg | DELAYED_RELEASE_TABLET | Freq: Every day | ORAL | 11 refills | Status: AC

## 2020-01-15 NOTE — Plan of Care (Signed)

## 2020-01-15 NOTE — TOC Progression Note (Signed)
Transition of Care [facility]) - Progression Note    Patient Details  Name: Monique Holland MRN: [id number] Date of Birth: August 28, 1965  Transition of Care [facility]) CM/SW [place], RN Phone Number: 01/15/2020, 10:07 AM  Clinical Narrative:    Case management met with the patient today regarding transitions to home.  The patient lives with her husband at the current listed address and the patient is agreeable to [facility] services for PT - requesting [facility] if possible - if not does not have a preference. Called [facility] for PT - waiting to here back for confirmation.  The patient's husband drives and she already has all needed dme at home including a RW and 3:1.  Patient will discharge today - husband to driver her home.   Expected Discharge Plan: [place] Barriers to Discharge: Barriers Resolved  Expected Discharge Plan and Services Expected Discharge Plan: [place]   Discharge Planning Services: CM Consult   Living arrangements for the past 2 months: Single Family Home                           HH Arranged: Refused HH           Social Determinants of Health (SDOH) Interventions    Readmission Risk Interventions Readmission Risk Prevention Plan 01/13/2020 10/01/2019 08/20/2019  Transportation Screening Complete Complete Complete  PCP or Specialist Appt within 5-7 Days - - -  Home Care Screening - - -  Medication Review (RN CM) - - -  Medication Review ([place]) Complete Complete Complete  PCP or Specialist appointment within 3-5 days of discharge Complete Complete Complete  HRI or Home Care Consult Patient refused Not Complete -  [place] or Home Care Consult Pt Refusal Comments - Unable to secure [facility] services; referral to OP rehab -  SW Recovery Care/Counseling Consult Patient refused Patient refused Complete  Palliative Care Screening Not Applicable Not Applicable Not [place] Not Applicable  Not Applicable Not Applicable  Some recent data might be hidden

## 2020-01-15 NOTE — Progress Notes (Signed)
Nutrition Follow-up  DOCUMENTATION CODES:   Morbid obesity  INTERVENTION:   -Continue Ensure Max po BID, each supplement provides 150 kcal and 30 grams of protein.   NUTRITION DIAGNOSIS:   Increased nutrient needs related to acute illness as evidenced by estimated needs.  Ongoing  GOAL:   Patient will meet greater than or equal to 90% of their needs  Progressing   MONITOR:   PO intake, Supplement acceptance, Labs, Weight trends  REASON FOR ASSESSMENT:   Malnutrition Screening Tool    ASSESSMENT:   54 year old female who presented to the ED on 8/22 with left-sided weakness. PMH of HTN, T2DM, HLD, bipolar disorder, schizophrenia, OSA. MRI negative for stroke. Pt received tPA on 8/21. Pt admitted with conversion disorder.  Reviewed I/O's: +720 ml x 24 hours and +1.9 L since admission  Spoke with pt at bedside, who reports feeling better today. Pt shares that her intake has improved, but she does not care for most of the food. She has been consuming mostly cereal and cheeseburgers and fries. PTA, pt consumes 2 meals per day (Breakfast: cheerios and Dinner: meat, starch, and vegetable). Noted meal completions 100%  Pt shares that she lost a lot of weight approximately 6 months ago secondary to GI flare up. Per pt, she used to weigh abound 350# and lost about 70# over this time period, which has since stabilized. However, this is not consistent with wt hx.   RD discussed importance of good meal and supplement intake to promote healing.  Lab Results  Component Value Date   HGBA1C 8.7 (H) 01/12/2020   PTA DM medications are 60 units insulin glargine BID and 50-1000 mg janumet BID.   Labs reviewed: CBGS: 155-209 (inpatient orders for glycemic control are 5 mg linagliptin daily, 1000 mg metformin BID, 0-20 units insulin aspart TID with meals and 0-5 units insulin aspart daily at bedtime).   NUTRITION - FOCUSED PHYSICAL EXAM:    Most Recent Value  Orbital Region No depletion   Upper Arm Region Mild depletion  Thoracic and Lumbar Region No depletion  Buccal Region No depletion  Temple Region No depletion  Clavicle Bone Region No depletion  Clavicle and Acromion Bone Region No depletion  Scapular Bone Region No depletion  Patellar Region No depletion  Anterior Thigh Region No depletion  Posterior Calf Region No depletion  Edema (RD Assessment) Mild  Hair Reviewed  Eyes Reviewed  Mouth Reviewed  Skin Reviewed  Nails Reviewed       Diet Order:   Diet Order            Diet heart healthy/carb modified Room service appropriate? Yes with Assist; Fluid consistency: Thin  Diet effective now                 EDUCATION NEEDS:   No education needs have been identified at this time  Skin:  Skin Assessment: Reviewed RN Assessment  Last BM:  01/13/20  Height:   Ht Readings from Last 1 Encounters:  01/13/20 5\' 8"  (1.727 m)    Weight:   Wt Readings from Last 1 Encounters:  01/13/20 124.3 kg    Ideal Body Weight:  63.6 kg  BMI:  Body mass index is 41.66 kg/m.  Estimated Nutritional Needs:   Kcal:  2100-2300  Protein:  100-115 grams  Fluid:  >/= 2.0 L    01/15/20, RD, LDN, CDCES Registered Dietitian II Certified Diabetes Care and Education Specialist Please refer to [facility] for RD and/or RD on-call/weekend/after  hours pager

## 2020-01-15 NOTE — Plan of Care (Signed)

## 2020-01-21 ENCOUNTER — Other Ambulatory Visit: Payer: Self-pay | Admitting: Obstetrics & Gynecology

## 2020-02-05 ENCOUNTER — Ambulatory Visit: Payer: Medicare Other | Admitting: Gastroenterology

## 2020-03-23 DEATH — deceased

## 2020-04-07 ENCOUNTER — Encounter: Payer: Self-pay | Admitting: *Deleted

## 2020-04-07 NOTE — Progress Notes (Signed)
[employer] [facility] - 04/07/20 0900      Assessment    Assessment type Other   Home number not accepting calls, cell phone has no VM

## 2020-04-09 ENCOUNTER — Encounter: Payer: Self-pay | Admitting: *Deleted

## 2020-04-09 NOTE — Progress Notes (Signed)
[employer] - 04/09/20 1500      Assessment    Assessment type Other   No answer on either number, no voicemail

## 2020-04-10 ENCOUNTER — Other Ambulatory Visit: Payer: Self-pay

## 2020-04-10 NOTE — Patient Outreach (Signed)
[facility]) Care Management  04/10/2020  Monique Holland [birth year] [id number]   First telephone outreach attempt to obtain mRS. No answer. Unable to leave message for returned call.  [name] [facility] Management Assistant [phone]

## 2020-04-15 ENCOUNTER — Encounter: Payer: Self-pay | Admitting: *Deleted

## 2020-04-15 NOTE — Progress Notes (Signed)
[employer] - 04/15/20 1200      Assessment    Assessment type Other   No answer at either number, no voicemail set up.

## 2020-04-22 ENCOUNTER — Other Ambulatory Visit: Payer: Self-pay

## 2020-04-24 ENCOUNTER — Other Ambulatory Visit: Payer: Self-pay

## 2020-04-27 ENCOUNTER — Other Ambulatory Visit: Payer: Self-pay

## 2020-04-27 NOTE — Patient Outreach (Signed)
[facility]) Care Management  04/27/2020  Farin Buhman [birth year] [id number]   3 outreach attempts were completed to obtain mRs. mRs could not be obtained because patient never returned my calls. mRs=7    [name] Care Management Assistant [phone]

## 2021-03-02 ENCOUNTER — Ambulatory Visit: Payer: Medicare Other | Admitting: Endocrinology
